# Patient Record
Sex: Male | Born: 1945 | Race: White | Hispanic: No | Marital: Married | Smoking: Never smoker
Health system: Southern US, Community
[De-identification: ages and names within clinical notes are randomized; demographics above are authoritative.]

## PROBLEM LIST (undated history)

## (undated) DIAGNOSIS — J189 Pneumonia, unspecified organism: Secondary | ICD-10-CM

## (undated) DIAGNOSIS — I517 Cardiomegaly: Secondary | ICD-10-CM

## (undated) DIAGNOSIS — H8110 Benign paroxysmal vertigo, unspecified ear: Secondary | ICD-10-CM

## (undated) DIAGNOSIS — Z87442 Personal history of urinary calculi: Secondary | ICD-10-CM

## (undated) DIAGNOSIS — N529 Male erectile dysfunction, unspecified: Secondary | ICD-10-CM

## (undated) DIAGNOSIS — F909 Attention-deficit hyperactivity disorder, unspecified type: Secondary | ICD-10-CM

## (undated) DIAGNOSIS — D649 Anemia, unspecified: Secondary | ICD-10-CM

## (undated) DIAGNOSIS — E785 Hyperlipidemia, unspecified: Secondary | ICD-10-CM

## (undated) DIAGNOSIS — M519 Unspecified thoracic, thoracolumbar and lumbosacral intervertebral disc disorder: Secondary | ICD-10-CM

## (undated) DIAGNOSIS — K219 Gastro-esophageal reflux disease without esophagitis: Secondary | ICD-10-CM

## (undated) DIAGNOSIS — G4700 Insomnia, unspecified: Secondary | ICD-10-CM

## (undated) DIAGNOSIS — F988 Other specified behavioral and emotional disorders with onset usually occurring in childhood and adolescence: Secondary | ICD-10-CM

## (undated) DIAGNOSIS — F329 Major depressive disorder, single episode, unspecified: Secondary | ICD-10-CM

## (undated) DIAGNOSIS — F419 Anxiety disorder, unspecified: Secondary | ICD-10-CM

## (undated) DIAGNOSIS — I2510 Atherosclerotic heart disease of native coronary artery without angina pectoris: Secondary | ICD-10-CM

## (undated) DIAGNOSIS — I209 Angina pectoris, unspecified: Secondary | ICD-10-CM

## (undated) DIAGNOSIS — IMO0001 Reserved for inherently not codable concepts without codable children: Secondary | ICD-10-CM

## (undated) DIAGNOSIS — F411 Generalized anxiety disorder: Secondary | ICD-10-CM

## (undated) DIAGNOSIS — I10 Essential (primary) hypertension: Secondary | ICD-10-CM

## (undated) DIAGNOSIS — M1990 Unspecified osteoarthritis, unspecified site: Secondary | ICD-10-CM

## (undated) DIAGNOSIS — F418 Other specified anxiety disorders: Secondary | ICD-10-CM

## (undated) DIAGNOSIS — F32A Depression, unspecified: Secondary | ICD-10-CM

## (undated) DIAGNOSIS — E291 Testicular hypofunction: Secondary | ICD-10-CM

## (undated) DIAGNOSIS — I252 Old myocardial infarction: Secondary | ICD-10-CM

## (undated) DIAGNOSIS — F901 Attention-deficit hyperactivity disorder, predominantly hyperactive type: Secondary | ICD-10-CM

## (undated) DIAGNOSIS — J449 Chronic obstructive pulmonary disease, unspecified: Secondary | ICD-10-CM

## (undated) DIAGNOSIS — M459 Ankylosing spondylitis of unspecified sites in spine: Secondary | ICD-10-CM

## (undated) HISTORY — DX: Old myocardial infarction: I25.2

## (undated) HISTORY — DX: Insomnia, unspecified: G47.00

## (undated) HISTORY — DX: Other specified anxiety disorders: F41.8

## (undated) HISTORY — DX: Ankylosing spondylitis of unspecified sites in spine: M45.9

## (undated) HISTORY — PX: HERNIA REPAIR: SHX51

## (undated) HISTORY — DX: Benign paroxysmal vertigo, unspecified ear: H81.10

## (undated) HISTORY — PX: CHOLECYSTECTOMY: SHX55

## (undated) HISTORY — DX: Gastro-esophageal reflux disease without esophagitis: K21.9

## (undated) HISTORY — DX: Major depressive disorder, single episode, unspecified: F32.9

## (undated) HISTORY — PX: CORONARY ANGIOPLASTY: SHX604

## (undated) HISTORY — DX: Depression, unspecified: F32.A

## (undated) HISTORY — DX: Unspecified thoracic, thoracolumbar and lumbosacral intervertebral disc disorder: M51.9

## (undated) HISTORY — DX: Attention-deficit hyperactivity disorder, unspecified type: F90.9

## (undated) HISTORY — DX: Unspecified osteoarthritis, unspecified site: M19.90

## (undated) HISTORY — PX: LUMBAR DISC SURGERY: SHX700

## (undated) HISTORY — DX: Hyperlipidemia, unspecified: E78.5

## (undated) HISTORY — DX: Male erectile dysfunction, unspecified: N52.9

## (undated) HISTORY — DX: Generalized anxiety disorder: F41.1

## (undated) HISTORY — PX: VASECTOMY: SHX75

## (undated) HISTORY — PX: OTHER SURGICAL HISTORY: SHX169

## (undated) HISTORY — PX: UPPER GI ENDOSCOPY: SHX6162

## (undated) HISTORY — PX: BACK SURGERY: SHX140

## (undated) HISTORY — PX: TONSILLECTOMY: SUR1361

## (undated) HISTORY — DX: Atherosclerotic heart disease of native coronary artery without angina pectoris: I25.10

## (undated) HISTORY — DX: Testicular hypofunction: E29.1

## (undated) HISTORY — DX: Cardiomegaly: I51.7

## (undated) HISTORY — DX: Other specified behavioral and emotional disorders with onset usually occurring in childhood and adolescence: F98.8

## (undated) HISTORY — PX: FOOT SURGERY: SHX648

## (undated) HISTORY — DX: Attention-deficit hyperactivity disorder, predominantly hyperactive type: F90.1

## (undated) HISTORY — PX: CORONARY ANGIOPLASTY WITH STENT PLACEMENT: SHX49

## (undated) HISTORY — PX: COLONOSCOPY: SHX174

---

## 1993-06-10 DIAGNOSIS — I252 Old myocardial infarction: Secondary | ICD-10-CM

## 1993-06-10 HISTORY — DX: Old myocardial infarction: I25.2

## 1998-03-12 ENCOUNTER — Inpatient Hospital Stay (HOSPITAL_COMMUNITY): Admission: EM | Admit: 1998-03-12 | Discharge: 1998-03-13 | Payer: Self-pay | Admitting: Emergency Medicine

## 1998-05-11 ENCOUNTER — Ambulatory Visit (HOSPITAL_COMMUNITY): Admission: RE | Admit: 1998-05-11 | Discharge: 1998-05-12 | Payer: Self-pay | Admitting: Cardiology

## 1999-10-26 ENCOUNTER — Emergency Department (HOSPITAL_COMMUNITY): Admission: EM | Admit: 1999-10-26 | Discharge: 1999-10-27 | Payer: Self-pay | Admitting: Emergency Medicine

## 1999-12-05 ENCOUNTER — Encounter: Admission: RE | Admit: 1999-12-05 | Discharge: 1999-12-05 | Payer: Self-pay | Admitting: Internal Medicine

## 1999-12-05 ENCOUNTER — Encounter: Payer: Self-pay | Admitting: Internal Medicine

## 1999-12-25 ENCOUNTER — Encounter: Admission: RE | Admit: 1999-12-25 | Discharge: 2000-01-17 | Payer: Self-pay | Admitting: Neurosurgery

## 2000-01-09 ENCOUNTER — Encounter (INDEPENDENT_AMBULATORY_CARE_PROVIDER_SITE_OTHER): Payer: Self-pay | Admitting: Specialist

## 2000-01-09 ENCOUNTER — Ambulatory Visit (HOSPITAL_COMMUNITY): Admission: RE | Admit: 2000-01-09 | Discharge: 2000-01-09 | Payer: Self-pay | Admitting: Gastroenterology

## 2000-03-07 ENCOUNTER — Other Ambulatory Visit: Admission: RE | Admit: 2000-03-07 | Discharge: 2000-03-07 | Payer: Self-pay | Admitting: Urology

## 2000-03-07 ENCOUNTER — Encounter (INDEPENDENT_AMBULATORY_CARE_PROVIDER_SITE_OTHER): Payer: Self-pay | Admitting: *Deleted

## 2001-02-01 ENCOUNTER — Encounter: Payer: Self-pay | Admitting: Emergency Medicine

## 2001-02-01 ENCOUNTER — Emergency Department (HOSPITAL_COMMUNITY): Admission: EM | Admit: 2001-02-01 | Discharge: 2001-02-02 | Payer: Self-pay | Admitting: Emergency Medicine

## 2001-04-10 ENCOUNTER — Encounter: Admission: RE | Admit: 2001-04-10 | Discharge: 2001-04-10 | Payer: Self-pay | Admitting: Gastroenterology

## 2001-04-10 ENCOUNTER — Encounter: Payer: Self-pay | Admitting: Gastroenterology

## 2004-01-19 ENCOUNTER — Encounter: Admission: RE | Admit: 2004-01-19 | Discharge: 2004-01-19 | Payer: Self-pay | Admitting: Family Medicine

## 2004-04-15 ENCOUNTER — Ambulatory Visit (HOSPITAL_BASED_OUTPATIENT_CLINIC_OR_DEPARTMENT_OTHER): Admission: RE | Admit: 2004-04-15 | Discharge: 2004-04-15 | Payer: Self-pay | Admitting: Internal Medicine

## 2004-05-07 ENCOUNTER — Ambulatory Visit (HOSPITAL_COMMUNITY): Admission: RE | Admit: 2004-05-07 | Discharge: 2004-05-07 | Payer: Self-pay | Admitting: Internal Medicine

## 2004-05-22 ENCOUNTER — Ambulatory Visit (HOSPITAL_COMMUNITY): Admission: RE | Admit: 2004-05-22 | Discharge: 2004-05-22 | Payer: Self-pay | Admitting: Internal Medicine

## 2004-05-23 ENCOUNTER — Ambulatory Visit (HOSPITAL_COMMUNITY): Admission: RE | Admit: 2004-05-23 | Discharge: 2004-05-23 | Payer: Self-pay | Admitting: Internal Medicine

## 2004-06-10 HISTORY — PX: CARDIAC CATHETERIZATION: SHX172

## 2004-07-03 ENCOUNTER — Ambulatory Visit: Payer: Self-pay | Admitting: Internal Medicine

## 2004-10-01 ENCOUNTER — Ambulatory Visit: Payer: Self-pay | Admitting: Internal Medicine

## 2005-02-07 ENCOUNTER — Ambulatory Visit (HOSPITAL_COMMUNITY): Admission: RE | Admit: 2005-02-07 | Discharge: 2005-02-07 | Payer: Self-pay | Admitting: Cardiology

## 2005-03-06 ENCOUNTER — Ambulatory Visit: Payer: Self-pay | Admitting: Pulmonary Disease

## 2005-04-01 ENCOUNTER — Ambulatory Visit: Payer: Self-pay | Admitting: Internal Medicine

## 2005-07-29 ENCOUNTER — Ambulatory Visit: Payer: Self-pay | Admitting: Internal Medicine

## 2005-09-02 ENCOUNTER — Encounter: Admission: RE | Admit: 2005-09-02 | Discharge: 2005-09-02 | Payer: Self-pay

## 2005-11-14 ENCOUNTER — Ambulatory Visit: Payer: Self-pay | Admitting: Family Medicine

## 2006-01-27 ENCOUNTER — Ambulatory Visit: Payer: Self-pay | Admitting: Internal Medicine

## 2006-02-14 ENCOUNTER — Ambulatory Visit: Payer: Self-pay | Admitting: Family Medicine

## 2006-02-19 ENCOUNTER — Ambulatory Visit: Payer: Self-pay | Admitting: Family Medicine

## 2006-02-24 ENCOUNTER — Ambulatory Visit: Payer: Self-pay | Admitting: Family Medicine

## 2006-02-28 ENCOUNTER — Ambulatory Visit: Payer: Self-pay | Admitting: Family Medicine

## 2006-03-31 LAB — HM COLONOSCOPY

## 2006-04-04 ENCOUNTER — Ambulatory Visit: Payer: Self-pay | Admitting: Family Medicine

## 2006-07-30 ENCOUNTER — Ambulatory Visit: Payer: Self-pay | Admitting: Internal Medicine

## 2006-09-24 ENCOUNTER — Ambulatory Visit (HOSPITAL_COMMUNITY): Admission: RE | Admit: 2006-09-24 | Discharge: 2006-09-25 | Payer: Self-pay | Admitting: Specialist

## 2006-09-24 ENCOUNTER — Encounter (INDEPENDENT_AMBULATORY_CARE_PROVIDER_SITE_OTHER): Payer: Self-pay | Admitting: *Deleted

## 2006-10-20 ENCOUNTER — Ambulatory Visit: Payer: Self-pay | Admitting: Family Medicine

## 2006-12-19 ENCOUNTER — Ambulatory Visit: Payer: Self-pay | Admitting: Family Medicine

## 2007-01-01 ENCOUNTER — Ambulatory Visit: Payer: Self-pay | Admitting: Family Medicine

## 2007-01-06 ENCOUNTER — Ambulatory Visit: Payer: Self-pay | Admitting: Vascular Surgery

## 2007-02-10 ENCOUNTER — Ambulatory Visit: Payer: Self-pay | Admitting: Internal Medicine

## 2007-03-23 ENCOUNTER — Ambulatory Visit: Payer: Self-pay | Admitting: Family Medicine

## 2007-04-16 ENCOUNTER — Ambulatory Visit (HOSPITAL_COMMUNITY): Admission: RE | Admit: 2007-04-16 | Discharge: 2007-04-17 | Payer: Self-pay | Admitting: Specialist

## 2007-04-23 ENCOUNTER — Ambulatory Visit: Payer: Self-pay | Admitting: Family Medicine

## 2007-04-30 ENCOUNTER — Ambulatory Visit: Payer: Self-pay | Admitting: Family Medicine

## 2007-05-01 ENCOUNTER — Telehealth: Payer: Self-pay | Admitting: Internal Medicine

## 2007-05-11 ENCOUNTER — Ambulatory Visit: Payer: Self-pay | Admitting: Internal Medicine

## 2007-06-09 ENCOUNTER — Encounter: Payer: Self-pay | Admitting: Internal Medicine

## 2007-06-09 DIAGNOSIS — F901 Attention-deficit hyperactivity disorder, predominantly hyperactive type: Secondary | ICD-10-CM | POA: Insufficient documentation

## 2007-06-09 DIAGNOSIS — J309 Allergic rhinitis, unspecified: Secondary | ICD-10-CM | POA: Insufficient documentation

## 2007-06-09 DIAGNOSIS — J209 Acute bronchitis, unspecified: Secondary | ICD-10-CM | POA: Insufficient documentation

## 2007-06-09 DIAGNOSIS — I252 Old myocardial infarction: Secondary | ICD-10-CM | POA: Insufficient documentation

## 2007-06-09 DIAGNOSIS — F909 Attention-deficit hyperactivity disorder, unspecified type: Secondary | ICD-10-CM | POA: Insufficient documentation

## 2007-06-15 ENCOUNTER — Ambulatory Visit: Payer: Self-pay | Admitting: Family Medicine

## 2007-06-18 ENCOUNTER — Encounter: Admission: RE | Admit: 2007-06-18 | Discharge: 2007-06-18 | Payer: Self-pay | Admitting: Family Medicine

## 2007-06-22 ENCOUNTER — Ambulatory Visit: Payer: Self-pay | Admitting: Family Medicine

## 2007-08-13 ENCOUNTER — Ambulatory Visit: Payer: Self-pay | Admitting: Family Medicine

## 2007-09-09 ENCOUNTER — Ambulatory Visit: Payer: Self-pay | Admitting: Internal Medicine

## 2007-09-12 DIAGNOSIS — J45998 Other asthma: Secondary | ICD-10-CM | POA: Insufficient documentation

## 2007-09-12 DIAGNOSIS — L299 Pruritus, unspecified: Secondary | ICD-10-CM | POA: Insufficient documentation

## 2007-10-13 ENCOUNTER — Ambulatory Visit: Payer: Self-pay | Admitting: Family Medicine

## 2007-10-16 ENCOUNTER — Telehealth: Payer: Self-pay | Admitting: Internal Medicine

## 2007-10-19 ENCOUNTER — Ambulatory Visit: Payer: Self-pay | Admitting: Internal Medicine

## 2007-10-19 LAB — CONVERTED CEMR LAB
AST: 33 units/L (ref 0–37)
Alkaline Phosphatase: 66 units/L (ref 39–117)
BUN: 17 mg/dL (ref 6–23)
Basophils Relative: 0.7 % (ref 0.0–1.0)
Bilirubin, Direct: 0.1 mg/dL (ref 0.0–0.3)
HCT: 42.7 % (ref 39.0–52.0)
Lymphocytes Relative: 29 % (ref 12.0–46.0)
MCHC: 33.6 g/dL (ref 30.0–36.0)
MCV: 89.2 fL (ref 78.0–100.0)
Neutrophils Relative %: 59.4 % (ref 43.0–77.0)
Potassium: 4.1 meq/L (ref 3.5–5.1)
RBC: 4.79 M/uL (ref 4.22–5.81)
Sodium: 142 meq/L (ref 135–145)
Total Protein: 7.3 g/dL (ref 6.0–8.3)

## 2008-01-07 ENCOUNTER — Ambulatory Visit: Payer: Self-pay | Admitting: Internal Medicine

## 2008-01-22 ENCOUNTER — Ambulatory Visit: Payer: Self-pay | Admitting: Internal Medicine

## 2008-02-01 ENCOUNTER — Ambulatory Visit: Payer: Self-pay | Admitting: Internal Medicine

## 2008-02-08 ENCOUNTER — Telehealth (INDEPENDENT_AMBULATORY_CARE_PROVIDER_SITE_OTHER): Payer: Self-pay | Admitting: *Deleted

## 2008-02-09 ENCOUNTER — Telehealth (INDEPENDENT_AMBULATORY_CARE_PROVIDER_SITE_OTHER): Payer: Self-pay | Admitting: *Deleted

## 2008-02-12 ENCOUNTER — Telehealth (INDEPENDENT_AMBULATORY_CARE_PROVIDER_SITE_OTHER): Payer: Self-pay | Admitting: *Deleted

## 2008-02-16 ENCOUNTER — Ambulatory Visit: Payer: Self-pay | Admitting: Internal Medicine

## 2008-02-19 ENCOUNTER — Telehealth (INDEPENDENT_AMBULATORY_CARE_PROVIDER_SITE_OTHER): Payer: Self-pay | Admitting: *Deleted

## 2008-02-23 ENCOUNTER — Ambulatory Visit: Payer: Self-pay | Admitting: Internal Medicine

## 2008-02-25 ENCOUNTER — Encounter: Payer: Self-pay | Admitting: Internal Medicine

## 2008-03-01 ENCOUNTER — Ambulatory Visit: Payer: Self-pay | Admitting: Internal Medicine

## 2008-03-02 ENCOUNTER — Ambulatory Visit: Payer: Self-pay | Admitting: Cardiology

## 2008-03-02 LAB — CONVERTED CEMR LAB
BUN: 23 mg/dL (ref 6–23)
CO2: 30 meq/L (ref 19–32)
Creatinine, Ser: 1.1 mg/dL (ref 0.4–1.5)
GFR calc Af Amer: 87 mL/min
GFR calc non Af Amer: 72 mL/min
Glucose, Bld: 92 mg/dL (ref 70–99)
Potassium: 4.1 meq/L (ref 3.5–5.1)
Pro B Natriuretic peptide (BNP): 12 pg/mL (ref 0.0–100.0)

## 2008-03-03 ENCOUNTER — Ambulatory Visit: Payer: Self-pay | Admitting: Internal Medicine

## 2008-03-17 ENCOUNTER — Ambulatory Visit: Payer: Self-pay | Admitting: Family Medicine

## 2008-03-18 ENCOUNTER — Telehealth (INDEPENDENT_AMBULATORY_CARE_PROVIDER_SITE_OTHER): Payer: Self-pay | Admitting: *Deleted

## 2008-03-23 ENCOUNTER — Encounter: Payer: Self-pay | Admitting: Internal Medicine

## 2008-03-24 ENCOUNTER — Ambulatory Visit: Payer: Self-pay | Admitting: Internal Medicine

## 2008-03-25 ENCOUNTER — Ambulatory Visit: Payer: Self-pay | Admitting: Internal Medicine

## 2008-03-28 ENCOUNTER — Ambulatory Visit: Payer: Self-pay | Admitting: Internal Medicine

## 2008-03-31 ENCOUNTER — Ambulatory Visit: Payer: Self-pay | Admitting: Internal Medicine

## 2008-04-04 ENCOUNTER — Ambulatory Visit: Payer: Self-pay | Admitting: Internal Medicine

## 2008-04-07 ENCOUNTER — Ambulatory Visit: Payer: Self-pay | Admitting: Internal Medicine

## 2008-04-12 ENCOUNTER — Ambulatory Visit: Payer: Self-pay | Admitting: Internal Medicine

## 2008-04-13 ENCOUNTER — Encounter: Payer: Self-pay | Admitting: Internal Medicine

## 2008-04-15 ENCOUNTER — Ambulatory Visit: Payer: Self-pay | Admitting: Internal Medicine

## 2008-04-18 ENCOUNTER — Ambulatory Visit: Payer: Self-pay | Admitting: Internal Medicine

## 2008-04-20 ENCOUNTER — Ambulatory Visit: Payer: Self-pay | Admitting: Internal Medicine

## 2008-04-21 ENCOUNTER — Ambulatory Visit: Payer: Self-pay | Admitting: Internal Medicine

## 2008-04-25 ENCOUNTER — Ambulatory Visit: Payer: Self-pay | Admitting: Internal Medicine

## 2008-04-29 ENCOUNTER — Ambulatory Visit: Payer: Self-pay | Admitting: Internal Medicine

## 2008-05-02 ENCOUNTER — Ambulatory Visit: Payer: Self-pay | Admitting: Internal Medicine

## 2008-05-09 ENCOUNTER — Ambulatory Visit: Payer: Self-pay | Admitting: Internal Medicine

## 2008-05-12 ENCOUNTER — Ambulatory Visit: Payer: Self-pay | Admitting: Internal Medicine

## 2008-05-16 ENCOUNTER — Ambulatory Visit: Payer: Self-pay | Admitting: Internal Medicine

## 2008-05-20 ENCOUNTER — Ambulatory Visit: Payer: Self-pay | Admitting: Internal Medicine

## 2008-05-23 ENCOUNTER — Ambulatory Visit: Payer: Self-pay | Admitting: Internal Medicine

## 2008-05-24 ENCOUNTER — Ambulatory Visit: Payer: Self-pay | Admitting: Internal Medicine

## 2008-06-13 ENCOUNTER — Ambulatory Visit: Payer: Self-pay | Admitting: Internal Medicine

## 2008-06-17 ENCOUNTER — Ambulatory Visit: Payer: Self-pay | Admitting: Internal Medicine

## 2008-06-20 ENCOUNTER — Ambulatory Visit: Payer: Self-pay | Admitting: Internal Medicine

## 2008-06-24 ENCOUNTER — Ambulatory Visit: Payer: Self-pay | Admitting: Internal Medicine

## 2008-07-01 ENCOUNTER — Ambulatory Visit: Payer: Self-pay | Admitting: Internal Medicine

## 2008-07-04 ENCOUNTER — Telehealth (INDEPENDENT_AMBULATORY_CARE_PROVIDER_SITE_OTHER): Payer: Self-pay | Admitting: *Deleted

## 2008-07-05 ENCOUNTER — Ambulatory Visit: Payer: Self-pay | Admitting: Internal Medicine

## 2008-07-08 ENCOUNTER — Ambulatory Visit: Payer: Self-pay | Admitting: Internal Medicine

## 2008-07-11 ENCOUNTER — Ambulatory Visit: Payer: Self-pay | Admitting: Internal Medicine

## 2008-07-11 ENCOUNTER — Ambulatory Visit: Payer: Self-pay | Admitting: Family Medicine

## 2008-07-14 ENCOUNTER — Ambulatory Visit: Payer: Self-pay | Admitting: Internal Medicine

## 2008-07-18 ENCOUNTER — Ambulatory Visit: Payer: Self-pay | Admitting: Internal Medicine

## 2008-07-21 ENCOUNTER — Ambulatory Visit: Payer: Self-pay | Admitting: Internal Medicine

## 2008-08-04 ENCOUNTER — Ambulatory Visit: Payer: Self-pay | Admitting: Internal Medicine

## 2008-08-10 ENCOUNTER — Ambulatory Visit: Payer: Self-pay | Admitting: Internal Medicine

## 2008-08-25 ENCOUNTER — Ambulatory Visit: Payer: Self-pay | Admitting: Internal Medicine

## 2008-09-02 ENCOUNTER — Ambulatory Visit: Payer: Self-pay | Admitting: Internal Medicine

## 2008-09-12 ENCOUNTER — Ambulatory Visit: Payer: Self-pay | Admitting: Internal Medicine

## 2008-09-21 ENCOUNTER — Ambulatory Visit: Payer: Self-pay | Admitting: Internal Medicine

## 2008-09-28 ENCOUNTER — Ambulatory Visit: Payer: Self-pay | Admitting: Internal Medicine

## 2008-09-30 ENCOUNTER — Telehealth (INDEPENDENT_AMBULATORY_CARE_PROVIDER_SITE_OTHER): Payer: Self-pay | Admitting: *Deleted

## 2008-10-05 ENCOUNTER — Ambulatory Visit: Payer: Self-pay | Admitting: Internal Medicine

## 2008-10-12 ENCOUNTER — Ambulatory Visit: Payer: Self-pay | Admitting: Internal Medicine

## 2008-10-21 ENCOUNTER — Ambulatory Visit: Payer: Self-pay | Admitting: Internal Medicine

## 2008-10-23 ENCOUNTER — Encounter: Payer: Self-pay | Admitting: Internal Medicine

## 2008-10-26 ENCOUNTER — Ambulatory Visit: Payer: Self-pay | Admitting: Internal Medicine

## 2008-11-01 ENCOUNTER — Ambulatory Visit: Payer: Self-pay | Admitting: Family Medicine

## 2008-11-02 ENCOUNTER — Ambulatory Visit: Payer: Self-pay | Admitting: Internal Medicine

## 2008-11-09 ENCOUNTER — Ambulatory Visit: Payer: Self-pay | Admitting: Internal Medicine

## 2008-11-17 ENCOUNTER — Ambulatory Visit: Payer: Self-pay | Admitting: Internal Medicine

## 2008-11-23 ENCOUNTER — Ambulatory Visit: Payer: Self-pay | Admitting: Internal Medicine

## 2008-12-05 ENCOUNTER — Ambulatory Visit: Payer: Self-pay | Admitting: Internal Medicine

## 2008-12-08 ENCOUNTER — Ambulatory Visit: Payer: Self-pay | Admitting: Internal Medicine

## 2008-12-15 ENCOUNTER — Ambulatory Visit: Payer: Self-pay | Admitting: Internal Medicine

## 2008-12-15 ENCOUNTER — Emergency Department (HOSPITAL_COMMUNITY): Admission: EM | Admit: 2008-12-15 | Discharge: 2008-12-16 | Payer: Self-pay | Admitting: Emergency Medicine

## 2008-12-23 ENCOUNTER — Ambulatory Visit: Payer: Self-pay | Admitting: Family Medicine

## 2009-01-03 ENCOUNTER — Ambulatory Visit: Payer: Self-pay | Admitting: Internal Medicine

## 2009-01-03 ENCOUNTER — Ambulatory Visit: Payer: Self-pay | Admitting: Family Medicine

## 2009-01-04 ENCOUNTER — Telehealth: Payer: Self-pay | Admitting: Internal Medicine

## 2009-01-10 ENCOUNTER — Ambulatory Visit: Payer: Self-pay | Admitting: Internal Medicine

## 2009-01-25 ENCOUNTER — Ambulatory Visit: Payer: Self-pay | Admitting: Internal Medicine

## 2009-02-02 ENCOUNTER — Ambulatory Visit: Payer: Self-pay | Admitting: Internal Medicine

## 2009-02-03 ENCOUNTER — Telehealth: Payer: Self-pay | Admitting: Internal Medicine

## 2009-02-08 ENCOUNTER — Ambulatory Visit: Payer: Self-pay | Admitting: Internal Medicine

## 2009-02-17 ENCOUNTER — Ambulatory Visit: Payer: Self-pay | Admitting: Internal Medicine

## 2009-02-23 ENCOUNTER — Ambulatory Visit: Payer: Self-pay | Admitting: Internal Medicine

## 2009-03-02 ENCOUNTER — Ambulatory Visit: Payer: Self-pay | Admitting: Internal Medicine

## 2009-03-02 ENCOUNTER — Ambulatory Visit: Payer: Self-pay | Admitting: Family Medicine

## 2009-03-08 ENCOUNTER — Ambulatory Visit: Payer: Self-pay | Admitting: Internal Medicine

## 2009-03-17 ENCOUNTER — Ambulatory Visit: Payer: Self-pay | Admitting: Internal Medicine

## 2009-03-17 ENCOUNTER — Ambulatory Visit: Payer: Self-pay | Admitting: Family Medicine

## 2009-03-20 ENCOUNTER — Ambulatory Visit: Payer: Self-pay | Admitting: Internal Medicine

## 2009-03-23 ENCOUNTER — Ambulatory Visit: Payer: Self-pay | Admitting: Internal Medicine

## 2009-03-29 ENCOUNTER — Ambulatory Visit: Payer: Self-pay | Admitting: Internal Medicine

## 2009-04-11 ENCOUNTER — Encounter: Payer: Self-pay | Admitting: Internal Medicine

## 2009-04-14 ENCOUNTER — Ambulatory Visit: Payer: Self-pay | Admitting: Internal Medicine

## 2009-04-19 ENCOUNTER — Ambulatory Visit: Payer: Self-pay | Admitting: Internal Medicine

## 2009-04-20 ENCOUNTER — Encounter: Payer: Self-pay | Admitting: Adult Health

## 2009-04-20 ENCOUNTER — Ambulatory Visit: Payer: Self-pay | Admitting: Internal Medicine

## 2009-04-21 LAB — CONVERTED CEMR LAB: IgE (Immunoglobulin E), Serum: 13.8 intl units/mL (ref 0.0–180.0)

## 2009-04-26 ENCOUNTER — Ambulatory Visit: Payer: Self-pay | Admitting: Internal Medicine

## 2009-05-02 ENCOUNTER — Ambulatory Visit: Payer: Self-pay | Admitting: Internal Medicine

## 2009-05-06 ENCOUNTER — Emergency Department (HOSPITAL_COMMUNITY): Admission: EM | Admit: 2009-05-06 | Discharge: 2009-05-07 | Payer: Self-pay | Admitting: Internal Medicine

## 2009-05-11 ENCOUNTER — Ambulatory Visit: Payer: Self-pay | Admitting: Internal Medicine

## 2009-05-18 ENCOUNTER — Ambulatory Visit: Payer: Self-pay | Admitting: Internal Medicine

## 2009-05-23 ENCOUNTER — Ambulatory Visit: Payer: Self-pay | Admitting: Internal Medicine

## 2009-05-31 ENCOUNTER — Encounter: Payer: Self-pay | Admitting: Internal Medicine

## 2009-05-31 ENCOUNTER — Ambulatory Visit (HOSPITAL_COMMUNITY): Admission: RE | Admit: 2009-05-31 | Discharge: 2009-05-31 | Payer: Self-pay | Admitting: Internal Medicine

## 2009-06-01 ENCOUNTER — Ambulatory Visit: Payer: Self-pay | Admitting: Internal Medicine

## 2009-06-05 ENCOUNTER — Ambulatory Visit: Payer: Self-pay | Admitting: Internal Medicine

## 2009-06-15 ENCOUNTER — Ambulatory Visit: Payer: Self-pay | Admitting: Internal Medicine

## 2009-06-23 ENCOUNTER — Ambulatory Visit: Payer: Self-pay | Admitting: Internal Medicine

## 2009-06-29 ENCOUNTER — Ambulatory Visit: Payer: Self-pay | Admitting: Internal Medicine

## 2009-07-05 ENCOUNTER — Ambulatory Visit: Payer: Self-pay | Admitting: Internal Medicine

## 2009-07-11 ENCOUNTER — Encounter (HOSPITAL_COMMUNITY): Admission: RE | Admit: 2009-07-11 | Discharge: 2009-10-09 | Payer: Self-pay | Admitting: Internal Medicine

## 2009-07-12 ENCOUNTER — Ambulatory Visit: Payer: Self-pay | Admitting: Internal Medicine

## 2009-07-18 ENCOUNTER — Telehealth: Payer: Self-pay | Admitting: Internal Medicine

## 2009-07-18 ENCOUNTER — Ambulatory Visit: Payer: Self-pay | Admitting: Internal Medicine

## 2009-07-25 ENCOUNTER — Telehealth: Payer: Self-pay | Admitting: Internal Medicine

## 2009-07-25 ENCOUNTER — Ambulatory Visit: Payer: Self-pay | Admitting: Internal Medicine

## 2009-07-26 ENCOUNTER — Telehealth (INDEPENDENT_AMBULATORY_CARE_PROVIDER_SITE_OTHER): Payer: Self-pay | Admitting: *Deleted

## 2009-08-02 ENCOUNTER — Ambulatory Visit: Payer: Self-pay | Admitting: Internal Medicine

## 2009-08-08 ENCOUNTER — Ambulatory Visit: Payer: Self-pay | Admitting: Internal Medicine

## 2009-08-16 ENCOUNTER — Ambulatory Visit: Payer: Self-pay | Admitting: Internal Medicine

## 2009-08-17 ENCOUNTER — Ambulatory Visit: Payer: Self-pay | Admitting: Family Medicine

## 2009-08-22 ENCOUNTER — Ambulatory Visit: Payer: Self-pay | Admitting: Internal Medicine

## 2009-09-05 ENCOUNTER — Encounter: Payer: Self-pay | Admitting: Family Medicine

## 2009-09-07 ENCOUNTER — Ambulatory Visit: Payer: Self-pay | Admitting: Internal Medicine

## 2009-09-08 ENCOUNTER — Ambulatory Visit: Payer: Self-pay | Admitting: Internal Medicine

## 2009-09-08 ENCOUNTER — Ambulatory Visit: Payer: Self-pay | Admitting: Family Medicine

## 2009-09-13 ENCOUNTER — Ambulatory Visit: Payer: Self-pay | Admitting: Internal Medicine

## 2009-09-15 ENCOUNTER — Ambulatory Visit: Payer: Self-pay | Admitting: Family Medicine

## 2009-09-18 ENCOUNTER — Ambulatory Visit: Payer: Self-pay | Admitting: Internal Medicine

## 2009-09-19 ENCOUNTER — Ambulatory Visit: Payer: Self-pay | Admitting: Internal Medicine

## 2009-09-26 ENCOUNTER — Encounter: Admission: RE | Admit: 2009-09-26 | Discharge: 2009-12-25 | Payer: Self-pay | Admitting: Family Medicine

## 2009-09-27 ENCOUNTER — Encounter: Payer: Self-pay | Admitting: Internal Medicine

## 2009-09-28 ENCOUNTER — Ambulatory Visit: Payer: Self-pay | Admitting: Internal Medicine

## 2009-10-04 ENCOUNTER — Ambulatory Visit: Payer: Self-pay | Admitting: Internal Medicine

## 2009-10-10 ENCOUNTER — Encounter (HOSPITAL_COMMUNITY): Admission: RE | Admit: 2009-10-10 | Discharge: 2009-10-10 | Payer: Self-pay | Admitting: Internal Medicine

## 2009-10-10 ENCOUNTER — Ambulatory Visit: Payer: Self-pay | Admitting: Internal Medicine

## 2009-10-18 ENCOUNTER — Ambulatory Visit: Payer: Self-pay | Admitting: Internal Medicine

## 2009-10-27 ENCOUNTER — Ambulatory Visit: Payer: Self-pay | Admitting: Internal Medicine

## 2009-11-01 ENCOUNTER — Ambulatory Visit: Payer: Self-pay | Admitting: Internal Medicine

## 2009-11-09 ENCOUNTER — Ambulatory Visit: Payer: Self-pay | Admitting: Internal Medicine

## 2009-11-13 ENCOUNTER — Encounter: Payer: Self-pay | Admitting: Internal Medicine

## 2009-11-14 ENCOUNTER — Ambulatory Visit: Payer: Self-pay | Admitting: Internal Medicine

## 2009-11-22 ENCOUNTER — Ambulatory Visit: Payer: Self-pay | Admitting: Internal Medicine

## 2009-12-05 ENCOUNTER — Ambulatory Visit: Payer: Self-pay | Admitting: Internal Medicine

## 2009-12-19 ENCOUNTER — Ambulatory Visit: Payer: Self-pay | Admitting: Internal Medicine

## 2009-12-27 ENCOUNTER — Ambulatory Visit: Payer: Self-pay | Admitting: Internal Medicine

## 2010-01-05 ENCOUNTER — Ambulatory Visit: Payer: Self-pay | Admitting: Internal Medicine

## 2010-01-08 ENCOUNTER — Ambulatory Visit: Payer: Self-pay | Admitting: Internal Medicine

## 2010-01-08 ENCOUNTER — Encounter: Payer: Self-pay | Admitting: Internal Medicine

## 2010-01-09 ENCOUNTER — Ambulatory Visit: Payer: Self-pay | Admitting: Internal Medicine

## 2010-01-15 ENCOUNTER — Telehealth (INDEPENDENT_AMBULATORY_CARE_PROVIDER_SITE_OTHER): Payer: Self-pay | Admitting: *Deleted

## 2010-01-25 ENCOUNTER — Ambulatory Visit: Payer: Self-pay | Admitting: Internal Medicine

## 2010-02-05 ENCOUNTER — Ambulatory Visit: Payer: Self-pay | Admitting: Internal Medicine

## 2010-02-13 ENCOUNTER — Ambulatory Visit: Payer: Self-pay | Admitting: Internal Medicine

## 2010-02-20 ENCOUNTER — Ambulatory Visit: Payer: Self-pay | Admitting: Internal Medicine

## 2010-02-26 ENCOUNTER — Ambulatory Visit: Payer: Self-pay | Admitting: Internal Medicine

## 2010-03-03 ENCOUNTER — Emergency Department (HOSPITAL_COMMUNITY): Admission: EM | Admit: 2010-03-03 | Discharge: 2010-03-04 | Payer: Self-pay | Admitting: Emergency Medicine

## 2010-03-07 ENCOUNTER — Ambulatory Visit: Payer: Self-pay | Admitting: Internal Medicine

## 2010-03-13 ENCOUNTER — Ambulatory Visit: Payer: Self-pay | Admitting: Internal Medicine

## 2010-03-15 ENCOUNTER — Ambulatory Visit: Payer: Self-pay | Admitting: Family Medicine

## 2010-03-19 ENCOUNTER — Encounter: Payer: Self-pay | Admitting: Internal Medicine

## 2010-03-19 ENCOUNTER — Ambulatory Visit: Payer: Self-pay | Admitting: Internal Medicine

## 2010-03-19 DIAGNOSIS — H8110 Benign paroxysmal vertigo, unspecified ear: Secondary | ICD-10-CM | POA: Insufficient documentation

## 2010-03-22 ENCOUNTER — Ambulatory Visit: Payer: Self-pay | Admitting: Family Medicine

## 2010-03-27 ENCOUNTER — Ambulatory Visit: Payer: Self-pay | Admitting: Internal Medicine

## 2010-04-04 ENCOUNTER — Ambulatory Visit: Payer: Self-pay | Admitting: Internal Medicine

## 2010-04-11 ENCOUNTER — Ambulatory Visit: Payer: Self-pay | Admitting: Internal Medicine

## 2010-04-18 ENCOUNTER — Ambulatory Visit: Payer: Self-pay | Admitting: Internal Medicine

## 2010-04-19 ENCOUNTER — Telehealth (INDEPENDENT_AMBULATORY_CARE_PROVIDER_SITE_OTHER): Payer: Self-pay | Admitting: *Deleted

## 2010-04-25 ENCOUNTER — Ambulatory Visit: Payer: Self-pay | Admitting: Internal Medicine

## 2010-05-01 ENCOUNTER — Ambulatory Visit: Payer: Self-pay | Admitting: Internal Medicine

## 2010-05-15 ENCOUNTER — Ambulatory Visit: Payer: Self-pay | Admitting: Internal Medicine

## 2010-05-23 ENCOUNTER — Ambulatory Visit: Payer: Self-pay | Admitting: Internal Medicine

## 2010-05-30 ENCOUNTER — Ambulatory Visit: Payer: Self-pay | Admitting: Internal Medicine

## 2010-06-07 ENCOUNTER — Ambulatory Visit: Admit: 2010-06-07 | Payer: Self-pay | Admitting: Family Medicine

## 2010-06-08 ENCOUNTER — Ambulatory Visit: Payer: Self-pay | Admitting: Internal Medicine

## 2010-06-13 ENCOUNTER — Ambulatory Visit
Admission: RE | Admit: 2010-06-13 | Discharge: 2010-06-13 | Payer: Self-pay | Source: Home / Self Care | Attending: Family Medicine | Admitting: Family Medicine

## 2010-06-14 ENCOUNTER — Ambulatory Visit: Payer: Self-pay | Admitting: Internal Medicine

## 2010-06-21 ENCOUNTER — Ambulatory Visit
Admission: RE | Admit: 2010-06-21 | Discharge: 2010-06-21 | Payer: Self-pay | Source: Home / Self Care | Attending: Internal Medicine | Admitting: Internal Medicine

## 2010-06-25 ENCOUNTER — Ambulatory Visit: Payer: Self-pay | Admitting: Internal Medicine

## 2010-07-03 ENCOUNTER — Ambulatory Visit: Payer: Self-pay | Admitting: Internal Medicine

## 2010-07-05 ENCOUNTER — Ambulatory Visit: Payer: Self-pay | Admitting: Internal Medicine

## 2010-07-09 ENCOUNTER — Ambulatory Visit: Admit: 2010-07-09 | Payer: Self-pay | Admitting: Internal Medicine

## 2010-07-10 NOTE — Assessment & Plan Note (Signed)
Summary: SICK/KLW   Primary Provider/Referring Provider:  Susann Givens  CC:  Accute visit-Vertigo; Head in pressure x 1 week."can't tell a difference with allergy shots"..  History of Present Illness: September 18, 2009- Allergic rhinitis, Asthma, Cough, GERD, Vertigo Having new problem with vertigo x 4 weeks recently- has seen Dr Susann Givens. Denies significant nasal congestion. This has not  been related to his asthma control. Often flares associated with occipital headache. He is also being evaluated for hearing loss by a hearing aid distributor. Pulmonary rehab has helped him manage his asthma better and he says he has not had a significant episode lately. Discussed pollen induced congestion/ eustachian dysfunction as a possible cause. ow using meclizine. No ear pain.  January 08, 2010- Allergic rhinitis, Asthma, Cough, GERD, Vertigo Outdoors a lot this weekend. Began feeling more short of breath. Harsh cough began yesterday. Throat sore from coughing. Denies sick contact. No fever but feels somewhat aching. He has been  back on allergy shots since 9/09. I explained that pollen levels are low and most of what is bothering people is air quality now not allergy. He is continuing nexium and zegerid plus sucralfate for reflux. Benzonatate helps. He still gets hoarse in choir.   March 19, 2010 Allergic rhinitis, Asthma, Cough, GERD, Vertigo Acute visit-Vertigo; Head in pressure x 1 week."can't tell a difference with allergy shots". After good summer, began having more dizziness/ vertigo a week ago. Sometimes feels pressure soreness mid forhead. Thinks he is worse with prolonged time outdoors. Sometimes head feels heavy, worse with movement and gets nauseated. Meclizine helps. Took an otc antihistamine.  Failed inner-ear desensitization positioning.    Asthma History    Asthma Control Assessment:    Age range: 12+ years    Symptoms: 0-2 days/week    Nighttime Awakenings: 0-2/month    Interferes w/  normal activity: no limitations    SABA use (not for EIB): 0-2 days/week    Asthma Control Assessment: Well Controlled   Preventive Screening-Counseling & Management  Alcohol-Tobacco     Smoking Status: never  Current Medications (verified): 1)  Wellbutrin Xl 300 Mg  Tb24 (Bupropion Hcl) .[name].. 1 Once Daily 2)  Paroxetine Hcl 20 Mg  Tabs (Paroxetine Hcl) .... 1/2 Once Daily 3)  Concerta 54 Mg Cr-Tabs (Methylphenidate Hcl) .[name].. 1 Once Daily 4)  Cardizem Cd 240 Mg  Cp24 (Diltiazem Hcl Coated Beads) .[name].. 1 Once Daily 5)  Adult Aspirin Low Strength 81 Mg  Tbdp (Aspirin) .[name].. 1 Once Daily 6)  Niaspan 1000 Mg  Tbcr (Niacin (Antihyperlipidemic)) .[name].. 1 1/2 Once Daily (1500mg ) 7)  Vytorin 10-80 Mg  Tabs (Ezetimibe-Simvastatin) .... Take 1 Tablet By Mouth Once A Day 8)  Singulair 10 Mg  Tabs (Montelukast Sodium) .... Take 1 Tablet By Mouth Once A Day 9)  Plavix 75 Mg  Tabs (Clopidogrel Bisulfate) .... Use As Directed 10)  Mobic 15 Mg  Tabs (Meloxicam) .... Take 1 Tablet By Mouth Once A Day 11)  Xanax 0.5 Mg  Tabs (Alprazolam) .... As Needed 12)  Meclizine Hcl 25 Mg  Tabs (Meclizine Hcl) .... As Needed 13)  Nitroquick 0.4 Mg  Subl (Nitroglycerin) .[name].. 1 Under Tongue Every 5 Min X3 14)  Ventolin Hfa 108 (90 Base) Mcg/act Aers (Albuterol Sulfate) .... 2 Puffs Every 4 Hours As Needed 15)  Zegerid 40-1100 Mg  Caps (Omeprazole-Sodium Bicarbonate) .[name].. 1 Capsule By Mouth Once Daily 16)  Astelin 137 Mcg/spray Soln (Azelastine Hcl) .... 2 Sprays Each Nostril Two Times A Day  17)  Symbicort 160-4.5 Mcg/act Aero (Budesonide-Formoterol Fumarate) .... 2 Puffs and Rinse Well Twice Daily Instead of Advair 18)  Benzonatate 200 Mg Caps (Benzonatate) .[name].. 1 By Mouth Three Times A Day As Needed Cough 19)  Allergy Vaccine 1:10 Gh 20)  Mucinex 600 Mg Xr12h-Tab (Guaifenesin) .... 2 Every 12 Hours As Needed 21)  Nexium 40 Mg Cpdr (Esomeprazole Magnesium) .[name].. 1 Once Daily 22)  Xyzal 5 Mg Tabs (Levocetirizine  Dihydrochloride) .[name].. 1 By Mouth Daily As Needed 23)  Pristiq 50 Mg Xr24h-Tab (Desvenlafaxine Succinate) .... Take 1 By Mouth Once Daily  Allergies (verified): No Known Drug Allergies  Past History:  Past Medical History: Last updated: 03/24/2008 CAD- Dr. 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   Risk Factors: Smoking Status: never (03/19/2010)  Social History: Patient never smoked.  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   Review of Systems      See HPI       The patient complains of headaches, nasal congestion/difficulty breathing through nose, and sneezing.  The patient denies shortness of breath with activity, shortness of breath at rest, productive cough, non-productive cough, coughing up blood, chest pain, irregular heartbeats, acid heartburn, indigestion, loss of appetite, weight change, abdominal pain, difficulty swallowing, sore throat, tooth/dental problems, hand/feet swelling, joint stiffness or pain, rash, change in color of mucus, and fever.    Vital Signs:  Patient profile:   65 year old male Height:      70 inches Weight:      165.25 pounds BMI:     23.80 O2 Sat:      97 % on Room air Pulse rate:   91 / minute BP sitting:   130 / 76  (left arm) Cuff size:   regular  Vitals Entered By: Reynaldo Minium CMA (March 19, 2010 9:50 AM)  O2 Flow:  Room air CC: Accute visit-Vertigo; Head in pressure x 1 week."can't tell a  difference with allergy shots".   Physical Exam  Additional Exam:  General: A/Ox3; pleasant and cooperative, NAD, looks relaxed. SKIN: no rash, lesions NODES: no lymphadenopathy HEENT: [place]/AT, EOM- WNL, Conjuctivae- clear, PERRLA, TM-WNL, Nose- clear, Throat-  not red, but frequent throat clearing, hoarseness, Mallampati II NECK: Supple w/ fair ROM, JVD- none, normal carotid impulses w/o bruits. Thyroid-  CHEST:  Clear to P&A, but harsh episodic cough. HEART: RRR, no m/g/r heard ABDOMEN: Soft and nl ZOX:WRUE, nl pulses, no edema  NEURO: Grossly intact to observation, no nystagmus      Impression & Recommendations:  Problem # 1:  ALLERGIC RHINITIS (ICD-477.9)  We discussed allergy vaccine for rhinitis with incidental hope for the vertigo. He has seen the right people. I will try increasing his allergy vaccine to 1:2. His updated medication list for this problem includes:    Astelin 137 Mcg/spray Soln (Azelastine hcl) .[name]... 2 sprays each nostril two times a day    Xyzal 5 Mg Tabs (Levocetirizine dihydrochloride) .[name]... 1 by mouth daily as needed    Cetirizine Hcl 10  Mg Tabs (Cetirizine hcl) .[name]... 1 each evening at bedtime  Problem # 2:  ASTHMA (ICD-493.90) Good control. His attention is on these episodic vetigo complaints.   Problem # 3:  BENIGN POSITIONAL VERTIGO (ICD-386.11)  This is only possibly related to his allergy status, but that is one of the few areas left that can be manipulated for theraperutic trial. We will have him try maintenance zyrtec.  Orders: Est. Patient Level IV ([address]109)  Medications Added to Medication List This Visit: 1)  Allergy Vaccine 1:2  2)  Cetirizine Hcl 10 Mg Tabs (Cetirizine hcl) .[name].. 1 each evening at bedtime  Patient Instructions: 1)  Please schedule a follow-up appointment in 3 months. 2)  I will have the allergy lab advance your vaccine to 1:2 at next order. 3)  Try generic Zyrtec (cetirizine) each night at bedtime to see if that helps  the vertigo.  4)  Flu vax   Prescriptions: CETIRIZINE HCL 10 MG TABS (CETIRIZINE HCL) 1 each evening at bedtime  #30 x prn   Entered and Authorized by:   Waymon Budge MD   Signed by:   Waymon Budge MD on 03/19/2010   Method used:   Historical   RxID:   [id number]     Appended Document: SICK/KLW-FLU SHOT Flu Vaccine Consent Questions     Do you have a history of severe allergic reactions to this vaccine? no    Any prior history of allergic reactions to egg and/or gelatin? no    Do you have a sensitivity to the preservative Thimersol? no    Do you have a past history of Guillan-Barre Syndrome? no    Do you currently have an acute febrile illness? no    Have you ever had a severe reaction to latex? no    Vaccine information given and explained to patient? yes    Are you currently pregnant? no    Lot Number:[id number]   Exp Date:12/08/2010   Site Given  Left Deltoid IM  Reynaldo Minium CMA  March 19, 2010 4:56 PM   Clinical Lists Changes  Orders: Added new Service order of Admin 1st Vaccine (95621) - Signed Added new Service order of Flu Vaccine 49yrs + [phone]) - Signed Observations: Added new observation of FLU VAX VIS: 01/02/10 version (03/19/2010 16:56) Added new observation of FLU VAXLOT: [id number] (03/19/2010 16:56) Added new observation of FLU VAXMFR: [employer] (03/19/2010 16:56) Added new observation of FLU VAX EXP: 12/08/2010 (03/19/2010 16:56) Added new observation of FLU VAX DSE: 0.11ml (03/19/2010 16:56) Added new observation of FLU VAX: Fluvax 3+ (03/19/2010 16:56)

## 2010-07-10 NOTE — Letter (Signed)
Summary: Letter from patient  Letter from patient   Imported By: Lester [place] 09/26/2009 09:29:28  _____________________________________________________________________  External Attachment:    Type:   Image     Comment:   External Document

## 2010-07-10 NOTE — Miscellaneous (Signed)
Summary: Discharged/Pulmonary Program  Discharged/Pulmonary Program   Imported By: Lester [place] 11/20/2009 10:23:30  _____________________________________________________________________  External Attachment:    Type:   Image     Comment:   External Document

## 2010-07-10 NOTE — Progress Notes (Signed)
Summary: requests to be seen today  Phone Note Call from Patient Call back at Home Phone [phone]   Caller: Patient Call For: young Summary of Call: pt c/o non-productive cough x 4 days. denies fever, pt requests to be seen today- tp not here- please advise.  Initial call taken by: Tivis Ringer, CNA,  July 18, 2009 9:15 AM  Follow-up for Phone Call        pt c/o non-productive cough, head congestion, body aches, denies fever. Pt has been using mucinex and netti pot with no relief. Pt has also been using benzonatate for his cough with no relief. pt requesting something for cough. Please advise. Carron Curie CMA  July 18, 2009 9:30 AM Allergies: NKDA  Additional Follow-up for Phone Call Additional follow up Details #1::        Please have pt come in for 415 appt- be here at 4pm.Katie Crestwood [facility] CMA  July 18, 2009 10:02 AM   pt schedueld. Carron Curie CMA  July 18, 2009 10:08 AM

## 2010-07-10 NOTE — Progress Notes (Signed)
Summary: refill  Phone Note Call from Patient   Caller: Patient Call For: young Summary of Call: calling for prescript for singulair refill. 90 day supply. [facility] Initial call taken by: Rickard Patience,  April 19, 2010 8:37 AM  Follow-up for Phone Call        Pt informed that refill for Singulairwas sent to pharmacy. Abigail Miyamoto RN  April 19, 2010 8:51 AM     Prescriptions: SINGULAIR 10 MG  TABS (MONTELUKAST SODIUM) Take 1 tablet by mouth once a day  #90 x 3   Entered by:   Abigail Miyamoto RN   Authorized by:   Waymon Budge MD   Signed by:   Abigail Miyamoto RN on 04/19/2010   Method used:   Faxed to ...       [facility] (mail-order)             , [place]         Ph: [phone]       Fax: [phone]   RxID:   [id number]

## 2010-07-10 NOTE — Progress Notes (Signed)
Summary: wheezing, non-productive cough - ok for pred taper per CDY  Phone Note Call from Patient Call back at Home Phone [phone]   Caller: Patient Call For: young Summary of Call: pt was seen on 8/1. says he is not better yet and is wheezing "a lot" please advise[facility] [address]  Initial call taken by: Tivis Ringer, CNA,  January 15, 2010 3:46 PM  Follow-up for Phone Call        Spoke with pt.  He states that he is no better since seen on 8/1- still coughing "getting worse"- non prod cough.  Also he has been wheezing and having increased SOB x 3 days.  Wheezing during the day with inactivity and seems to get worse when lies down.  Pls advise thanks! NKDA  Follow-up by: Vernie Murders,  January 15, 2010 3:52 PM  Additional Follow-up for Phone Call Additional follow up Details #1::        per CDY: okay for pred taper.  prednisone 10mg   #20 4tabsx2days, 3x2days, 2x2days, 1x2days and stop.  called spoke with patient, informed of CDY's recs.  pt verbalized his understanding.  [facility] [address] Additional Follow-up by: Boone Master CNA/MA,  January 15, 2010 5:17 PM    New/Updated Medications: PREDNISONE 10 MG TABS (PREDNISONE) 4tabsx2days, 3x2days, 2x2days, 1x2days and stop. Prescriptions: PREDNISONE 10 MG TABS (PREDNISONE) 4tabsx2days, 3x2days, 2x2days, 1x2days and stop.  #20 x 0   Entered by:   Boone Master CNA/MA   Authorized by:   Waymon Budge MD   Signed by:   Boone Master CNA/MA on 01/15/2010   Method used:   [facility]  [address] [id number]       [address]       [place], [place]  [address]       Ph: [phone] or [phone]       Fax: [phone]   RxID:   [id number]

## 2010-07-10 NOTE — Assessment & Plan Note (Signed)
Summary: coughing/ mbw   Primary Provider/Referring Provider:  Susann Givens  CC:  Accute visit-cough since friday(non productive)nausea feelings yesterday(thinks due to not eating)..  History of Present Illness: July 18, 2009- Allergic rhinitis, Asthma, Cough, GERD Hard cough for past week without fever or sore throat. Sinus pain with little return from Neti pot.  Body aches last week for one day with fever, then resolved. Wheeze. Had flu vax. Dry cough. He will be restarting pulmonary rehab but deferred today because not feeling well.  September 18, 2009- Allergic rhinitis, Asthma, Cough, GERD, Vertigo Having new problem with vertigo x 4 weeks recently- has seen Dr Susann Givens. Denies significant nasal congestion. This has not  been related to his asthma control. Often flares associated with occipital headache. He is also being evaluated for hearing loss by a hearing aid distributor. Pulmonary rehab has helped him manage his asthma better and he says he has not had a significant episode lately. Discussed pollen induced congestion/ eustachian dysfunction as a possible cause. ow using meclizine. No ear pain.  January 08, 2010- Allergic rhinitis, Asthma, Cough, GERD, Vertigo Outdoors a lot this weekend. Began feeling more short of breath. Harsh cough began yesterday. Throat sore from coughing. Denies sick contact. No fever but feels somewhat aching. He has been  back on allergy shots since 9/09. I explained that pollen levels are low and most of what is bothering people is air quality now not allergy. He is continuing nexium and zegerid plus sucralfate for reflux. Benzonatate helps. He still gets hoarse in choir.   Asthma History    Initial Asthma Severity Rating:    Age range: 12+ years    Symptoms: 0-2 days/week    Nighttime Awakenings: 0-2/month    Interferes w/ normal activity: no limitations    SABA use (not for EIB): 0-2 days/week    Asthma Severity Assessment: Intermittent   Preventive  Screening-Counseling & Management  Alcohol-Tobacco     Smoking Status: never  Current Medications (verified): 1)  Wellbutrin Xl 300 Mg  Tb24 (Bupropion Hcl) .[name].. 1 Once Daily 2)  Paroxetine Hcl 20 Mg  Tabs (Paroxetine Hcl) .... 1/2 Once Daily 3)  Concerta 54 Mg Cr-Tabs (Methylphenidate Hcl) .[name].. 1 Once Daily 4)  Cardizem Cd 240 Mg  Cp24 (Diltiazem Hcl Coated Beads) .[name].. 1 Once Daily 5)  Adult Aspirin Low Strength 81 Mg  Tbdp (Aspirin) .[name].. 1 Once Daily 6)  Niaspan 1000 Mg  Tbcr (Niacin (Antihyperlipidemic)) .[name].. 1 1/2 Once Daily (1500mg ) 7)  Vytorin 10-80 Mg  Tabs (Ezetimibe-Simvastatin) .... Take 1 Tablet By Mouth Once A Day 8)  Singulair 10 Mg  Tabs (Montelukast Sodium) .... Take 1 Tablet By Mouth Once A Day 9)  Plavix 75 Mg  Tabs (Clopidogrel Bisulfate) .... Use As Directed 10)  Mobic 15 Mg  Tabs (Meloxicam) .... Take 1 Tablet By Mouth Once A Day 11)  Xanax 0.5 Mg  Tabs (Alprazolam) .... As Needed 12)  Meclizine Hcl 25 Mg  Tabs (Meclizine Hcl) .... As Needed 13)  Nitroquick 0.4 Mg  Subl (Nitroglycerin) .[name].. 1 Under Tongue Every 5 Min X3 14)  Ventolin Hfa 108 (90 Base) Mcg/act Aers (Albuterol Sulfate) .... 2 Puffs Every 4 Hours As Needed 15)  Zegerid 40-1100 Mg  Caps (Omeprazole-Sodium Bicarbonate) .[name].. 1 Capsule By Mouth Once Daily 16)  Astelin 137 Mcg/spray Soln (Azelastine Hcl) .... 2 Sprays Each Nostril Two Times A Day 17)  Symbicort 160-4.5 Mcg/act Aero (Budesonide-Formoterol Fumarate) .... 2 Puffs and Rinse Well Twice  Daily Instead of Advair 18)  Benzonatate 200 Mg Caps (Benzonatate) .[name].. 1 By Mouth Three Times A Day As Needed Cough 19)  Allergy Vaccine 1:10 Gh 20)  Mucinex 600 Mg Xr12h-Tab (Guaifenesin) .... 2 Every 12 Hours As Needed 21)  Nexium 40 Mg Cpdr (Esomeprazole Magnesium) .[name].. 1 Once Daily 22)  Xyzal 5 Mg Tabs (Levocetirizine Dihydrochloride) .[name].. 1 By Mouth Daily As Needed 23)  Pristiq 50 Mg Xr24h-Tab (Desvenlafaxine Succinate) .... Take 1 By Mouth Once  Daily  Allergies (verified): No Known Drug Allergies  Review of Systems      See HPI       The patient complains of non-productive cough.  The patient denies shortness of breath with activity, shortness of breath at rest, productive cough, chest pain, irregular heartbeats, acid heartburn, indigestion, loss of appetite, weight change, abdominal pain, difficulty swallowing, sore throat, tooth/dental problems, headaches, nasal congestion/difficulty breathing through nose, and sneezing.    Vital Signs:  Patient profile:   65 year old male Height:      70 inches Weight:      181 pounds BMI:     26.06 O2 Sat:      94 % on Room air Pulse rate:   103 / minute BP sitting:   124 / 72  (right arm) Cuff size:   regular  Vitals Entered By: Reynaldo Minium CMA (January 08, 2010 2:52 PM)  O2 Flow:  Room air CC: Accute visit-cough since friday(non productive)nausea feelings yesterday(thinks due to not eating).   Physical Exam  Additional Exam:  General: A/Ox3; pleasant and cooperative, NAD, SKIN: no rash, lesions NODES: no lymphadenopathy HEENT: [place]/AT, EOM- WNL, Conjuctivae- clear, PERRLA, TM-WNL, Nose- clear, Throat-  not red, but frequent throat clearing, hoarseness, Mallampati II NECK: Supple w/ fair ROM, JVD- none, normal carotid impulses w/o bruits. Thyroid-  CHEST:  Clear to P&A, but harsh episodic cough. HEART: RRR, no m/g/r heard ABDOMEN: Soft and nl IHK:VQQV, nl pulses, no edema  NEURO: Grossly intact to observation, no nystagmus      Impression & Recommendations:  Problem # 1:  ASTHMATIC BRONCHITIS, ACUTE (ICD-466.0)  This is a  nonspecific tracheobronchitis- viral vs allergic vs reflux. Try Dialresp as an airway antiinflammatory. His updated medication list for this problem includes:    Singulair 10 Mg Tabs (Montelukast sodium) .[name]... Take 1 tablet by mouth once a day    Ventolin Hfa 108 (90 Base) Mcg/act Aers (Albuterol sulfate) .[name]... 2 puffs every 4 hours as needed     Symbicort 160-4.5 Mcg/act Aero (Budesonide-formoterol fumarate) .[name]... 2 puffs and rinse well twice daily instead of advair    Benzonatate 200 Mg Caps (Benzonatate) .[name]... 1 by mouth three times a day as needed cough    Mucinex 600 Mg Xr12h-tab (Guaifenesin) .[name]... 2 every 12 hours as needed  Problem # 2:  GERD (ICD-530.81) Being heavily medicated for GERD, which makes that less likely as a basis for his current cough and congestion, His updated medication list for this problem includes:    Zegerid 40-1100 Mg Caps (Omeprazole-sodium bicarbonate) .[name]... 1 capsule by mouth once daily    Nexium 40 Mg Cpdr (Esomeprazole magnesium) .[name]... 1 once daily  Other Orders: Est. Patient Level III (95638)  Patient Instructions: 1)  Please schedule a follow-up appointment in 6 months or earlier if needed. 2)  Try sample Dialresp 500 micrograms - 1 daily as an airway antiinflammatory.

## 2010-07-10 NOTE — Miscellaneous (Signed)
Summary: Injection Orders / [facility]    Injection Orders / [facility]    Imported By: Lennie Odor 04/17/2010 10:46:38  _____________________________________________________________________  External Attachment:    Type:   Image     Comment:   External Document

## 2010-07-10 NOTE — Progress Notes (Signed)
Summary: New Rx Ins. won't pay for current one.  Phone Note Other Incoming   Caller: Pt. came in for shot Details for Reason: Ins. won't pay for clarinex. Summary of Call: [name] would like for you to call in another antihistimine for him. His ins. will not cover clarinex. [facility] [address] [phone]. Please call pt. and let him know what you advise. Initial call taken by: Dimas Millin,  July 25, 2009 1:38 PM  Follow-up for Phone Call        Ask him to check with his insurance and find out their preferred antihistamine list. He can let us know which from that list he would like to use. Follow-up by: Waymon Budge MD,  July 25, 2009 5:54 PM  Additional Follow-up for Phone Call Additional follow up Details #1::        advised pt to call insurance and ask what meds will be covered instead of clarinex and let us know, and then CY can choose the best alternative. Pt states he will do so and call back with list.  Carron Curie CMA  July 26, 2009 8:59 AM

## 2010-07-10 NOTE — Assessment & Plan Note (Signed)
Summary: congestion//ok per CY//jrc   Primary Provider/Referring Provider:  Susann Givens  CC:  Accute visit-deep chest congestion; fatigued. Wheezing. Last Tuesday had bodyaches.  History of Present Illness: June 23, 2009- GERD, allergic rhinitis, asthma, cough [place] went to ER for dyspnea. CXR was neg. Given neb, injection and antibiotic. Has had 2 similar episodes since "didn't feel well, couldn't rest" due to tiredness and short of breath with occasional cough but no wheeze. Benzonatate controls cough. Dry cough, no phlegm, chest pain or discolored. Asks script Clarinex. No edema, palpitation, leg pain or swelling. Admits he gets anxious, managed with Xanax or relaxation. Denies feeling anxious. He asks about pulmonary rehab here. [facility] in 2009[facility] rehab after his MI. Dr Donnie Aho would have done any echocardiogram- not recalled.   He "didn't pass out, but close- tingling" during PFT/ [facility]  July 18, 2009- Allergic rhinitis, Asthma, Cough, GERD Hard cough for past week without fever or sore throat. Sinus pain with little return from Neti pot.  Body aches last week for one day with fever, then resolved. Wheeze. Had flu vax. Dry cough. He will be restarting pulmonary rehab but deferred today because not feeling well.    Current Medications (verified): 1)  Wellbutrin Xl 300 Mg  Tb24 (Bupropion Hcl) .[name].. 1 Once Daily 2)  Paroxetine Hcl 20 Mg  Tabs (Paroxetine Hcl) .... 1/2 Once Daily 3)  Concerta 54 Mg Cr-Tabs (Methylphenidate Hcl) .[name].. 1 Once Daily 4)  Cardizem Cd 240 Mg  Cp24 (Diltiazem Hcl Coated Beads) .[name].. 1 Once Daily 5)  Adult Aspirin Low Strength 81 Mg  Tbdp (Aspirin) .[name].. 1 Once Daily 6)  Niaspan 1000 Mg  Tbcr (Niacin (Antihyperlipidemic)) .[name].. 1 1/2 Once Daily (1500mg ) 7)  Vytorin 10-80 Mg  Tabs (Ezetimibe-Simvastatin) .... Take 1 Tablet By Mouth Once A Day 8)  Singulair 10 Mg  Tabs (Montelukast Sodium) .... Take  1 Tablet By Mouth Once A Day 9)  Plavix 75 Mg  Tabs (Clopidogrel Bisulfate) .... Use As Directed 10)  Mobic 15 Mg  Tabs (Meloxicam) .... Take 1 Tablet By Mouth Once A Day 11)  Xanax 0.5 Mg  Tabs (Alprazolam) .... As Needed 12)  Meclizine Hcl 25 Mg  Tabs (Meclizine Hcl) .... As Needed 13)  Nitroquick 0.4 Mg  Subl (Nitroglycerin) .[name].. 1 Under Tongue Every 5 Min X3 14)  Ventolin Hfa 108 (90 Base) Mcg/act Aers (Albuterol Sulfate) .... 2 Puffs Every 4 Hours As Needed 15)  Zegerid 40-1100 Mg  Caps (Omeprazole-Sodium Bicarbonate) .[name].. 1 Capsule By Mouth Once Daily 16)  Astelin 137 Mcg/spray Soln (Azelastine Hcl) .... 2 Sprays Each Nostril Two Times A Day 17)  Symbicort 160-4.5 Mcg/act Aero (Budesonide-Formoterol Fumarate) .... 2 Puffs and Rinse Well Twice Daily Instead of Advair 18)  Benzonatate 200 Mg Caps (Benzonatate) .[name].. 1 By Mouth Three Times A Day As Needed Cough 19)  Allergy Vaccine 1:10 Gh 20)  Mucinex 600 Mg Xr12h-Tab (Guaifenesin) .... 2 Every 12 Hours As Needed 21)  Nexium 40 Mg Cpdr (Esomeprazole Magnesium) .[name].. 1 Once Daily 22)  Clarinex 5 Mg Tabs (Desloratadine) .[name].. 1 By Mouth Once Daily As Needed 23)  Pristiq 50 Mg Xr24h-Tab (Desvenlafaxine Succinate) .... Take 1 By Mouth Once Daily  Allergies (verified): No Known Drug Allergies  Past History:  Past Medical History: Last updated: 03/24/2008 CAD- Dr. 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  Risk Factors: Smoking Status: never (02/16/2008)  Review of Systems      See HPI       The patient complains of prolonged cough.   The patient denies anorexia, weight loss, weight gain, vision loss, decreased hearing, hoarseness, chest pain, syncope, dyspnea on exertion, peripheral edema, headaches, hemoptysis, abdominal pain, and severe indigestion/heartburn.    Vital Signs:  Patient profile:   65 year old male Height:      70 inches Weight:      176 pounds BMI:     25.34 O2 Sat:      94 % on Room air Pulse rate:   112 / minute BP sitting:   134 / 80  (left arm) Cuff size:   regular  Vitals Entered By: Reynaldo Minium CMA (July 18, 2009 4:52 PM)  O2 Flow:  Room air  Physical Exam  Additional Exam:  General: A/Ox3; pleasant and cooperative, NAD, SKIN: no rash, lesions NODES: no lymphadenopathy HEENT: [place]/AT, EOM- WNL, Conjuctivae- clear, PERRLA, TM-WNL, Nose- watery drainage Throat- red, hoarseness.Melampatti II NECK: Supple w/ fair ROM, JVD- none, normal carotid impulses w/o bruits Thyroid-  CHEST: Harsh cough, otherwise clear chest HEART: RRR, no m/g/r heard ABDOMEN: Soft and nl ONG:EXBM, nl pulses, no edema  NEURO: Grossly intact to observation      Impression & Recommendations:  Problem # 1:  ASTHMA (ICD-493.90) Acute bronchitis syndrome, consistent with blunted influenza syndrome. We discussed and will try tamiflu with cortisone, fluids and cough syrup.  Medications Added to Medication List This Visit: 1)  Pristiq 50 Mg Xr24h-tab (Desvenlafaxine succinate) .... Take 1 by mouth once daily 2)  Hydrocodone-homatropine 5-1.5 Mg/36ml Syrp (Hydrocodone-homatropine) .[name].. 1 teaspoon four times a day as needed cough 3)  Tamiflu 75 Mg Caps (Oseltamivir phosphate) .[name].. 1 two times a day x 5 days  Other Orders: Est. Patient Level II (84132)  Patient Instructions: 1)  Keep scheduled appointment- earlier if needed 2)  scripts for cough syrup and Tamiflu 3)  Extra fluids, tylenol and comfort measures as you find what works for you. Prescriptions: TAMIFLU 75 MG CAPS (OSELTAMIVIR PHOSPHATE) 1 two times a  day x 5 days  #10 x 0   Entered and Authorized by:   Waymon Budge MD   Signed by:   Waymon Budge MD on 07/18/2009   Method used:   Print then Give to Patient   RxID:   [id number] HYDROCODONE-HOMATROPINE 5-1.5 MG/5ML SYRP (HYDROCODONE-HOMATROPINE) 1 teaspoon four times a day as needed cough  #200 ml x 0   Entered and Authorized by:   Waymon Budge MD   Signed by:   Waymon Budge MD on 07/18/2009   Method used:   Print then Give to Patient   RxID:   [id number]

## 2010-07-10 NOTE — Miscellaneous (Signed)
Summary: Injection Record/[facility]  Injection Record/[facility]   Imported By: Sherian Rein 10/12/2009 13:48:11  _____________________________________________________________________  External Attachment:    Type:   Image     Comment:   External Document

## 2010-07-10 NOTE — Assessment & Plan Note (Signed)
Summary: 6 months/apc   Primary Provider/Referring Provider:  Susann Givens  CC:  6 month follow up visit-vertigo episodes again 3 in the past 4 weeks. [name]  History of Present Illness: June 23, 2009- GERD, allergic rhinitis, asthma, cough [place] ago went to ER for dyspnea. CXR was neg. Given neb, injection and antibiotic. Has had 2 similar episodes since "didn't feel well, couldn't rest" due to tiredness and short of breath with occasional cough but no wheeze. Benzonatate controls cough. Dry cough, no phlegm, chest pain or discolored. Asks script Clarinex. No edema, palpitation, leg pain or swelling. Admits he gets anxious, managed with Xanax or relaxation. Denies feeling anxious. He asks about pulmonary rehab here. His [facility] was [facility] in 2009[facility] cardiac rehab after his MI. Dr Donnie Aho would have done any echocardiogram- not recalled.   He "didn't pass out, but close- tingling" during PFT/ [facility]  July 18, 2009- Allergic rhinitis, Asthma, Cough, GERD Hard cough for past week without fever or sore throat. Sinus pain with little return from Neti pot.  Body aches last week for one day with fever, then resolved. Wheeze. Had flu vax. Dry cough. He will be restarting pulmonary rehab but deferred today because not feeling well.  September 18, 2009- Allergic rhinitis, Asthma, Cough, GERD, Vertigo Having new problem with vertigo x 4 weeks recently- has seen Dr Susann Givens. Denies significant nasal congestion. This has not  been related to his asthma control. Often flares associated with occipital headache. He is also being evaluated for hearing loss by a hearing aid distributor. Pulmonary rehab has helped him manage his asthma better and he says he has not had a significant episode lately. Discussed pollen induced congestion/ eustachian dysfunction as a possible cause. ow using meclizine. No ear pain.     Current Medications (verified): 1)  Wellbutrin Xl 300 Mg  Tb24  (Bupropion Hcl) .[name].. 1 Once Daily 2)  Paroxetine Hcl 20 Mg  Tabs (Paroxetine Hcl) .... 1/2 Once Daily 3)  Concerta 54 Mg Cr-Tabs (Methylphenidate Hcl) .[name].. 1 Once Daily 4)  Cardizem Cd 240 Mg  Cp24 (Diltiazem Hcl Coated Beads) .[name].. 1 Once Daily 5)  Adult Aspirin Low Strength 81 Mg  Tbdp (Aspirin) .[name].. 1 Once Daily 6)  Niaspan 1000 Mg  Tbcr (Niacin (Antihyperlipidemic)) .[name].. 1 1/2 Once Daily (1500mg ) 7)  Vytorin 10-80 Mg  Tabs (Ezetimibe-Simvastatin) .... Take 1 Tablet By Mouth Once A Day 8)  Singulair 10 Mg  Tabs (Montelukast Sodium) .... Take 1 Tablet By Mouth Once A Day 9)  Plavix 75 Mg  Tabs (Clopidogrel Bisulfate) .... Use As Directed 10)  Mobic 15 Mg  Tabs (Meloxicam) .... Take 1 Tablet By Mouth Once A Day 11)  Xanax 0.5 Mg  Tabs (Alprazolam) .... As Needed 12)  Meclizine Hcl 25 Mg  Tabs (Meclizine Hcl) .... As Needed 13)  Nitroquick 0.4 Mg  Subl (Nitroglycerin) .[name].. 1 Under Tongue Every 5 Min X3 14)  Ventolin Hfa 108 (90 Base) Mcg/act Aers (Albuterol Sulfate) .... 2 Puffs Every 4 Hours As Needed 15)  Zegerid 40-1100 Mg  Caps (Omeprazole-Sodium Bicarbonate) .[name].. 1 Capsule By Mouth Once Daily 16)  Astelin 137 Mcg/spray Soln (Azelastine Hcl) .... 2 Sprays Each Nostril Two Times A Day 17)  Symbicort 160-4.5 Mcg/act Aero (Budesonide-Formoterol Fumarate) .... 2 Puffs and Rinse Well Twice Daily Instead of Advair 18)  Benzonatate 200 Mg Caps (Benzonatate) .[name].. 1 By Mouth Three Times A Day As Needed Cough  19)  Allergy Vaccine 1:10 Gh 20)  Mucinex 600 Mg Xr12h-Tab (Guaifenesin) .... 2 Every 12 Hours As Needed 21)  Nexium 40 Mg Cpdr (Esomeprazole Magnesium) .[name].. 1 Once Daily 22)  Xyzal 5 Mg Tabs (Levocetirizine Dihydrochloride) .[name].. 1 By Mouth Daily As Needed 23)  Pristiq 50 Mg Xr24h-Tab (Desvenlafaxine Succinate) .... Take 1 By Mouth Once Daily  Allergies (verified): No Known Drug Allergies  Past History:  Past Medical History: Last updated: 03/24/2008 CAD- Dr. 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  Risk Factors: Smoking Status: never (02/16/2008)  Review of Systems      See HPI       The patient complains of headaches.  The patient denies anorexia, fever, weight loss, weight gain, vision loss, decreased hearing, hoarseness, chest pain, syncope, dyspnea on exertion, peripheral edema, prolonged cough, hemoptysis, abdominal pain, and severe indigestion/heartburn.    Vital Signs:  Patient profile:   65 year old male Height:      70 inches Weight:      187 pounds BMI:     26.93 O2 Sat:      98 % on Room air Pulse rate:   84 / minute BP sitting:   122 / 84  (left arm) Cuff size:   regular  Vitals Entered By: Reynaldo Minium CMA (September 18, 2009 9:07 AM)  O2 Flow:  Room air  Physical Exam  Additional Exam:  General: A/Ox3; pleasant and cooperative, NAD, SKIN: no rash, lesions NODES: no lymphadenopathy HEENT: [place]/AT, EOM- WNL, Conjuctivae- clear, PERRLA, TM-WNL, Nose- clear, Throat- red, hoarseness, Mallampati II NECK: Supple w/ fair ROM, JVD- none, normal carotid impulses w/o bruits. Thyroid-  CHEST:  Clear to P&A HEART: RRR, no m/g/r heard ABDOMEN: Soft and nl ZOX:WRUE, nl pulses, no edema  NEURO: Grossly intact to observation, no nystagmus      Impression & Recommendations:  Problem # 1:  ALLERGIC RHINITIS (ICD-477.9)  I can't really tell that his rhinitis is related to the vertigo, pointing out that meclizine is an antihistamine. Today he feels well. We discussed pollen season expectations. I suggested there might be a connection between  hearing loss and vertigo, and he might want to ask Dr Susann Givens about ENT eval if things don't go well. His updated medication list for this problem includes:    Astelin 137 Mcg/spray Soln (Azelastine hcl) .[name]... 2 sprays each nostril two times a day    Xyzal 5 Mg Tabs (Levocetirizine dihydrochloride) .[name]... 1 by mouth daily as needed  Problem # 2:  ASTHMA (ICD-493.90) Good control now. He will finish up at his pulmonary rehab.  Other Orders: Est. Patient Level III (45409)  Patient Instructions: 1)  Please schedule a follow-up appointment in 6 months.

## 2010-07-10 NOTE — Progress Notes (Signed)
Summary: Change Clarinex to Xyzal  Phone Note Call from Patient   Caller: Patient Call For: young Summary of Call: insurance will cover xyzal [facility] [address] Initial call taken by: Rickard Patience,  July 26, 2009 10:27 AM  Follow-up for Phone Call        pt insurance will cover xyzal instead of clarinex. Please advise on dosage and directions. Thanks. Carron Curie CMA  July 26, 2009 10:36 AM  per [facility] ok for xyzal 5mg  1 once daily as needed #30 with as needed refills Follow-up by: Reynaldo Minium CMA,  July 26, 2009 10:49 AM  Additional Follow-up for Phone Call Additional follow up Details #1::        LMOM letting the patient know [facility] is changing the Clarinex to Xyzal and we are sending RX to his pharmacy.Michel Bickers CMA  July 26, 2009 10:54 AM    New/Updated Medications: XYZAL 5 MG TABS (LEVOCETIRIZINE DIHYDROCHLORIDE) 1 by mouth daily as needed Prescriptions: XYZAL 5 MG TABS (LEVOCETIRIZINE DIHYDROCHLORIDE) 1 by mouth daily as needed  #30 x 6   Entered by:   Michel Bickers CMA   Authorized by:   Waymon Budge MD   Signed by:   Michel Bickers CMA on 07/26/2009   Method used:   Electronically to        [facility]  [address] [id number] ([facility])       [address]       [place], [place]  [address]       Ph: [phone] or [phone]       Fax: [phone]   RxID:   [id number]

## 2010-07-10 NOTE — Miscellaneous (Signed)
Summary: Injection Record / [facility]    Injection Record / [facility]    Imported By: Lennie Odor 02/09/2010 10:28:21  _____________________________________________________________________  External Attachment:    Type:   Image     Comment:   External Document

## 2010-07-10 NOTE — Miscellaneous (Signed)
Summary: Injection Record/[facility]  Injection Record/[facility]   Imported By: Sherian Rein 10/31/2009 12:49:12  _____________________________________________________________________  External Attachment:    Type:   Image     Comment:   External Document

## 2010-07-10 NOTE — Miscellaneous (Signed)
Summary: Order for Pulm Rehab/[facility]  Order for Pulm Rehab/[facility]   Imported By: Sherian Rein 10/10/2009 08:57:55  _____________________________________________________________________  External Attachment:    Type:   Image     Comment:   External Document

## 2010-07-10 NOTE — Miscellaneous (Signed)
Summary: Injection Orders / [facility]    Injection Orders / [facility]    Imported By: Lennie Odor 11/07/2009 16:01:47  _____________________________________________________________________  External Attachment:    Type:   Image     Comment:   External Document

## 2010-07-10 NOTE — Miscellaneous (Signed)
Summary: Injection Record/[facility]  Injection Record/[facility]   Imported By: Sherian Rein 10/31/2009 08:37:09  _____________________________________________________________________  External Attachment:    Type:   Image     Comment:   External Document

## 2010-07-10 NOTE — Assessment & Plan Note (Signed)
Summary: 1 month/apc   Primary Provider/Referring Provider:  Susann Givens  CC:  follow up visit-request RX for Clarinex (30 day supply)..  History of Present Illness: April 20, 2009 --Presents for an acute office visit. Complains of asthma flare: wheezing, increased SOB, dry cough x2weeks. OTC not helping. Denies chest pain, dyspnea, orthopnea, hemoptysis, fever, n/v/d, edema, headache.    June 23, 2009- April 20, 2009 --Presents for an acute office visit. Complains of asthma flare: wheezing, increased SOB, dry cough x2weeks. OTC not helping. Denies chest pain, dyspnea, orthopnea, hemoptysis, fever, n/v/d, edema, headache.   May 23, 2009- GERD, allergic rhinitis, asthma, cough Back on Zegerid and nexium- Dr Matthias Hughs. Was having a lot of reflux/ heartburn and wnt to ER for GI cocktail, and also got IV meds. has felt "acidy" a few times since. This past Sunday, he woke with face itching from his "histamine attack"- dry red itching skin-  usually whole body, not just face. Dr Dorita Sciara PA blamed combination of weather and allergies and gave cortisone oint and an unk antihistamine that he hasnt taken yet. Still occ chest tight, need to dry cough, postnasal drip. Feels best if he keeps taking benzonatate and antihistamine. Continues allergy vaccine. Long talk about differnet antihistamines and overdrying. He continues daily allegra and may supplement   June 23, 2009- GERD, allergic rhinitis, asthjma, cough [place] went to ER for dyspnea. CXR was neg. Given neb, injection and antibiotic. Has had 2 similar episodes since "didn't feel well, couldn't rest" due to tiredness and short of breath with occasional cough but no wheeze. Benzonatate controls cough. Dry cough, no phlegm, chest pain or discolored. Asks script Clarinex. No edema, palpitation, leg pain or swelling. Admits he gets anxious, managed with Xanax or relaxation. Denies feeling anxious. He asks about pulmonary  rehab here. His P[facility] was WNL in 2009[facility] rehab after his MI. Dr Donnie Aho would have done any echocardiogram- not recalled. h  He "didn't pass out, but close- tingling" during PFT/ [facility]   Current Medications (verified): 1)  Wellbutrin Xl 300 Mg  Tb24 (Bupropion Hcl) .[name].. 1 Once Daily 2)  Paroxetine Hcl 20 Mg  Tabs (Paroxetine Hcl) .... 1/2 Once Daily 3)  Concerta 54 Mg Cr-Tabs (Methylphenidate Hcl) .[name].. 1 Once Daily 4)  Cardizem Cd 240 Mg  Cp24 (Diltiazem Hcl Coated Beads) .[name].. 1 Once Daily 5)  Adult Aspirin Low Strength 81 Mg  Tbdp (Aspirin) .[name].. 1 Once Daily 6)  Niaspan 1000 Mg  Tbcr (Niacin (Antihyperlipidemic)) .[name].. 1 1/2 Once Daily (1500mg ) 7)  Vytorin 10-80 Mg  Tabs (Ezetimibe-Simvastatin) .... Take 1 Tablet By Mouth Once A Day 8)  Singulair 10 Mg  Tabs (Montelukast Sodium) .... Take 1 Tablet By Mouth Once A Day 9)  Plavix 75 Mg  Tabs (Clopidogrel Bisulfate) .... Use As Directed 10)  Mobic 15 Mg  Tabs (Meloxicam) .... Take 1 Tablet By Mouth Once A Day 11)  Xanax 0.5 Mg  Tabs (Alprazolam) .... As Needed 12)  Meclizine Hcl 25 Mg  Tabs (Meclizine Hcl) .... As Needed 13)  Nitroquick 0.4 Mg  Subl (Nitroglycerin) .[name].. 1 Under Tongue Every 5 Min X3 14)  Ventolin Hfa 108 (90 Base) Mcg/act Aers (Albuterol Sulfate) .... 2 Puffs Every 4 Hours As Needed 15)  Zegerid 40-1100 Mg  Caps (Omeprazole-Sodium Bicarbonate) .[name].. 1 Capsule By Mouth Once Daily 16)  Astelin 137 Mcg/spray Soln (Azelastine Hcl) .... 2 Sprays Each Nostril Two Times A  Day 17)  Symbicort 160-4.5 Mcg/act Aero (Budesonide-Formoterol Fumarate) .... 2 Puffs and Rinse Well Twice Daily Instead of Advair 18)  Benzonatate 200 Mg Caps (Benzonatate) .[name].. 1 By Mouth Three Times A Day As Needed Cough 19)  Allergy Vaccine 1:10 Gh 20)  Mucinex 600 Mg Xr12h-Tab (Guaifenesin) .... 2 Every 12 Hours As Needed 21)  Nexium 40 Mg Cpdr (Esomeprazole Magnesium) .[name].. 1 Once Daily  Allergies (verified): No Known Drug  Allergies  Past History:  Past Medical History: Last updated: 03/24/2008 CAD- Dr. 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  Risk Factors: Smoking Status: never (02/16/2008)  Review of Systems      See HPI       The patient complains of dyspnea on exertion.  The patient denies anorexia, fever, weight loss, weight gain, vision loss, decreased hearing, hoarseness, chest pain, syncope, peripheral edema, prolonged cough, headaches, hemoptysis, abdominal pain, and severe indigestion/heartburn.    Vital Signs:  Patient profile:   65 year old male Height:      70 inches Weight:      182 pounds BMI:     26.21 O2 Sat:      97 % on Room air Pulse rate:   104 / minute BP sitting:   154 / 92  (left arm) Cuff size:   regular  Vitals Entered By: Reynaldo Minium CMA (June 23, 2009 3:44 PM)  O2 Flow:  Room air  Physical Exam  Additional Exam:  General: A/Ox3; pleasant and cooperative, NAD, SKIN: no rash, lesions NODES: no lymphadenopathy HEENT: [place]/AT, EOM- WNL, Conjuctivae- clear, PERRLA, TM-WNL, Nose- watery drainage Throat- clear and wnl, hoarseness.Melampatti II NECK: Supple w/ fair ROM, JVD- none, normal carotid impulses w/o bruits Thyroid-  CHEST: Coarse BS , no rales or definite wheeze now.  HEART: RRR, no m/g/r heard ABDOMEN: Soft and nl ZOX:WRUE, nl pulses, no edema  NEURO: Grossly intact to observation      Impression & Recommendations:  Problem # 1:  ASTHMA (ICD-493.90) Exertional dyspnea, deconditioning, anxiety and possible cardiac limitiation. I can put him in  touch with the pulmonary rehab program and see what they say. He has trouble with PFT effort that sounds like hyperventilation. We need the [facility] report.  Other Orders: Est. Patient Level III (45409) Rehabilitation Referral (Rehab)  Patient Instructions: 1)  Please schedule a follow-up appointment in 3 months. 2)  See Bjorn Loser about referral to [facility] 3)  We will contact [facility] PFT lab for results of test you had in December.    Appended Document: 1 month/apc Spoke with Marcelino Duster at [facility] PFT; faxing report to [place]; will place in [facility]'s look at for review.

## 2010-07-11 DIAGNOSIS — J301 Allergic rhinitis due to pollen: Secondary | ICD-10-CM

## 2010-07-12 NOTE — Miscellaneous (Signed)
Summary: Injection record  Injection record   Imported By: Lester Williston 06/22/2010 12:23:32  _____________________________________________________________________  External Attachment:    Type:   Image     Comment:   External Document

## 2010-07-12 NOTE — Assessment & Plan Note (Signed)
Summary: 3 mth//jrc   Primary Provider/Referring Provider:  Susann Givens  CC:  3 month follow up visit-allergies..  History of Present Illness: January 08, 2010- Allergic rhinitis, Asthma, Cough, GERD, Vertigo Outdoors a lot this weekend. Began feeling more short of breath. Harsh cough began yesterday. Throat sore from coughing. Denies sick contact. No fever but feels somewhat aching. He has been  back on allergy shots since 9/09. I explained that pollen levels are low and most of what is bothering people is air quality now not allergy. He is continuing nexium and zegerid plus sucralfate for reflux. Benzonatate helps. He still gets hoarse in choir.   March 19, 2010 Allergic rhinitis, Asthma, Cough, GERD, Vertigo Acute visit-Vertigo; Head in pressure x 1 week."can't tell a difference with allergy shots". After good summer, began having more dizziness/ vertigo a week ago. Sometimes feels pressure soreness mid forhead. Thinks he is worse with prolonged time outdoors. Sometimes head feels heavy, worse with movement and gets nauseated. Meclizine helps. Took an otc antihistamine.  Failed inner-ear desensitization positioning.   June 21, 2010- Allergic rhinitis, Asthma, Cough, GERD, Vertigo Nurse-CC: 3 month follow up visit-allergies. Dealing with a gastroenteritis for which he says he was put on a sulfa drug and remains on aggressive GERD management by Dr Matthias Hughs. Otherwise says his breathing issues have been well controlled w/o acute problem this winter. Maintains on Symbicort, singulair. Has used Ventolin 1-2 x/ week  recently, but not at all in 3 weeks. Continuing allergy vaccine now increased to 1:2 with some soreness a few hours after his first shot- to be watched.    Asthma History    Asthma Control Assessment:    Age range: 12+ years    Symptoms: 0-2 days/week    Nighttime Awakenings: 0-2/month    Interferes w/ normal activity: no limitations    SABA use (not for EIB): 0-2 days/week  Asthma Control Assessment: Well Controlled   Preventive Screening-Counseling & Management  Alcohol-Tobacco     Smoking Status: never  Current Medications (verified): 1)  Wellbutrin Xl 300 Mg  Tb24 (Bupropion Hcl) .[name].. 1 Once Daily 2)  Paroxetine Hcl 20 Mg  Tabs (Paroxetine Hcl) .... 1/2 Once Daily 3)  Concerta 54 Mg Cr-Tabs (Methylphenidate Hcl) .[name].. 1 Once Daily 4)  Cardizem Cd 240 Mg  Cp24 (Diltiazem Hcl Coated Beads) .[name].. 1 Once Daily 5)  Adult Aspirin Low Strength 81 Mg  Tbdp (Aspirin) .[name].. 1 Once Daily 6)  Niaspan 1000 Mg  Tbcr (Niacin (Antihyperlipidemic)) .[name].. 1 1/2 Once Daily (1500mg ) 7)  Vytorin 10-80 Mg  Tabs (Ezetimibe-Simvastatin) .... Take 1 Tablet By Mouth Once A Day 8)  Singulair 10 Mg  Tabs (Montelukast Sodium) .... Take 1 Tablet By Mouth Once A Day 9)  Plavix 75 Mg  Tabs (Clopidogrel Bisulfate) .... Use As Directed 10)  Mobic 15 Mg  Tabs (Meloxicam) .... Take 1 Tablet By Mouth Once A Day 11)  Xanax 0.5 Mg  Tabs (Alprazolam) .... As Needed 12)  Meclizine Hcl 25 Mg  Tabs (Meclizine Hcl) .... As Needed 13)  Nitroquick 0.4 Mg  Subl (Nitroglycerin) .[name].. 1 Under Tongue Every 5 Min X3 14)  Ventolin Hfa 108 (90 Base) Mcg/act Aers (Albuterol Sulfate) .... 2 Puffs Every 4 Hours As Needed 15)  Zegerid 40-1100 Mg  Caps (Omeprazole-Sodium Bicarbonate) .[name].. 1 Capsule By Mouth Once Daily 16)  Astelin 137 Mcg/spray Soln (Azelastine Hcl) .... 2 Sprays Each Nostril Two Times A Day 17)  Symbicort 160-4.5 Mcg/act Aero (Budesonide-Formoterol Fumarate) .Marland KitchenMarland KitchenMarland Kitchen  2 Puffs and Rinse Well Twice Daily Instead of Advair 18)  Benzonatate 200 Mg Caps (Benzonatate) .[name].. 1 By Mouth Three Times A Day As Needed Cough 19)  Allergy Vaccine 1:2 20)  Mucinex 600 Mg Xr12h-Tab (Guaifenesin) .... 2 Every 12 Hours As Needed 21)  Nexium 40 Mg Cpdr (Esomeprazole Magnesium) .[name].. 1 Once Daily 22)  Xyzal 5 Mg Tabs (Levocetirizine Dihydrochloride) .[name].. 1 By Mouth Daily As Needed 23)  Pristiq 50 Mg Xr24h-Tab  (Desvenlafaxine Succinate) .... Take 1 By Mouth Once Daily 24)  Cetirizine Hcl 10 Mg Tabs (Cetirizine Hcl) .[name].. 1 Each Evening At Bedtime 25)  Promethazine Hcl 12.5 Mg Tabs (Promethazine Hcl) .... Take 1 By Mouth Four Times A Day As Needed Nausea  Allergies (verified): No Known Drug Allergies  Past History:  Past Medical History: Last updated: 03/24/2008 CAD- Dr. Donnie Aho Myocardial Infarction ADHD Allergic Rhinitis- Skin Test 02/23/08 vaccine 2009 Asthma- Mecolyl Neg 2005 [facility]  Past Surgical History: Last updated: 09/09/2007 Lumbar disc sx Cardiac cath Stent angioplasty  Family History: Last updated: 07/03/2010 Mother- died diabetes, dementia Father - died heart disease Daughtrer- Chronic fatigue   Social History: Last updated: 03/19/2010 Patient never smoked.  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   Risk Factors: Smoking Status: never (Jul 03, 2010)  Family History: Mother- died diabetes, dementia Father - died heart disease Daughtrer- Chronic fatigue   Review of Systems      See HPI  The patient denies shortness of breath with activity, shortness of breath at rest, productive cough, non-productive cough, coughing up blood, chest pain, irregular heartbeats, acid heartburn, indigestion, loss of appetite, weight change, abdominal pain, difficulty swallowing, sore throat, tooth/dental problems, headaches, nasal congestion/difficulty breathing through nose, and sneezing.    Vital Signs:  Patient profile:   66 year old male Height:      70 inches Weight:      161 pounds BMI:     23.18 O2 Sat:      97 % on Room air Pulse rate:   95 / minute BP sitting:   116 / 76  (left arm) Cuff size:   regular  Vitals Entered By: Reynaldo Minium CMA (07-03-2010 9:18 AM)  O2 Flow:  Room air CC: 3 month follow up visit-allergies.   Physical Exam  Additional Exam:  General: A/Ox3; pleasant  and cooperative, NAD, looks relaxed. SKIN: no rash, lesions NODES: no lymphadenopathy HEENT: [place]/AT, EOM- WNL, Conjuctivae- clear, PERRLA, TM-WNL, Nose- clear, Throat-  not red, Mallampati II NECK: Supple w/ fair ROM, JVD- none, normal carotid impulses w/o bruits. Thyroid-  CHEST:  Clear to P&A, HEART: RRR, no m/g/r heard ABDOMEN: Soft and nl EXB:MWUX, nl pulses, no edema  NEURO: Grossly intact to observation, no nystagmus      Impression & Recommendations:  Problem # 1:  ASTHMA (ICD-493.90) He has always mixed symptoms of reflux and  airway irritability. I think he is doing well now with asthma control. Discussed ways to reduce meds. he can try off Singulair and he can try reducing Symbicort to 1 puff two times a day.   Problem # 2:  ALLERGIC RHINITIS (ICD-477.9)  Continues allergy vaccine. We will reassess appropriateness as the seasonal pollens rise starting next month.  His updated medication list for this problem includes:    Astelin 137 Mcg/spray Soln (Azelastine hcl) .[name]... 2 sprays each nostril two times a day    Xyzal  5 Mg Tabs (Levocetirizine dihydrochloride) .[name]... 1 by mouth daily as needed    Cetirizine Hcl 10 Mg Tabs (Cetirizine hcl) .[name]... 1 each evening at bedtime    Promethazine Hcl 12.5 Mg Tabs (Promethazine hcl) .[name]... Take 1 by mouth four times a day as needed nausea  Medications Added to Medication List This Visit: 1)  Promethazine Hcl 12.5 Mg Tabs (Promethazine hcl) .... Take 1 by mouth four times a day as needed nausea  Other Orders: Est. Patient Level III (16109)  Patient Instructions: 1)  Please schedule a follow-up appointment in 6 months. 2)  You can try off and on Singulair a week at a time as you decide in different seasons, whether you need it or not.  3)  Try reducing your Symbicort to 1 puff, twice daily. See if that will hold you over time. If wheezing comes back, then start back to 2 puffs twice daily.

## 2010-07-17 DIAGNOSIS — J301 Allergic rhinitis due to pollen: Secondary | ICD-10-CM

## 2010-07-25 ENCOUNTER — Ambulatory Visit (INDEPENDENT_AMBULATORY_CARE_PROVIDER_SITE_OTHER): Payer: 59

## 2010-07-25 DIAGNOSIS — J301 Allergic rhinitis due to pollen: Secondary | ICD-10-CM

## 2010-08-01 ENCOUNTER — Ambulatory Visit (INDEPENDENT_AMBULATORY_CARE_PROVIDER_SITE_OTHER): Payer: 59

## 2010-08-01 DIAGNOSIS — J301 Allergic rhinitis due to pollen: Secondary | ICD-10-CM

## 2010-08-08 ENCOUNTER — Encounter: Payer: Self-pay | Admitting: Internal Medicine

## 2010-08-08 ENCOUNTER — Ambulatory Visit (INDEPENDENT_AMBULATORY_CARE_PROVIDER_SITE_OTHER): Payer: 59

## 2010-08-08 DIAGNOSIS — J301 Allergic rhinitis due to pollen: Secondary | ICD-10-CM | POA: Insufficient documentation

## 2010-08-08 HISTORY — DX: Allergic rhinitis due to pollen: J30.1

## 2010-08-14 ENCOUNTER — Ambulatory Visit (INDEPENDENT_AMBULATORY_CARE_PROVIDER_SITE_OTHER): Payer: 59

## 2010-08-15 ENCOUNTER — Encounter: Payer: Self-pay | Admitting: Internal Medicine

## 2010-08-15 DIAGNOSIS — J301 Allergic rhinitis due to pollen: Secondary | ICD-10-CM

## 2010-08-16 NOTE — Assessment & Plan Note (Signed)
Summary: ALLERGY/CB   Nurse Visit   Allergies: No Known Drug Allergies  Orders Added: 1)  Allergy Injection (1) [95115] 

## 2010-08-21 NOTE — Assessment & Plan Note (Signed)
Summary: allergy/cb  Nurse Visit   Allergies: No Known Drug Allergies  Orders Added: 1)  Allergy Injection (1) [95115] 

## 2010-08-23 ENCOUNTER — Ambulatory Visit (INDEPENDENT_AMBULATORY_CARE_PROVIDER_SITE_OTHER): Payer: 59

## 2010-08-23 LAB — POCT CARDIAC MARKERS

## 2010-08-23 LAB — POCT I-STAT, CHEM 8
BUN: 16 mg/dL (ref 6–23)
Chloride: 107 mEq/L (ref 96–112)
Creatinine, Ser: 0.9 mg/dL (ref 0.4–1.5)
Hemoglobin: 13.6 g/dL (ref 13.0–17.0)
TCO2: 24 mmol/L (ref 0–100)

## 2010-08-24 ENCOUNTER — Encounter: Payer: Self-pay | Admitting: Internal Medicine

## 2010-08-24 DIAGNOSIS — J301 Allergic rhinitis due to pollen: Secondary | ICD-10-CM

## 2010-08-28 NOTE — Assessment & Plan Note (Signed)
Summary: allergy/cb  Nurse Visit   Allergies: No Known Drug Allergies  Orders Added: 1)  Allergy Injection (1) [95115] 

## 2010-08-29 ENCOUNTER — Ambulatory Visit (INDEPENDENT_AMBULATORY_CARE_PROVIDER_SITE_OTHER): Payer: 59

## 2010-08-29 DIAGNOSIS — J301 Allergic rhinitis due to pollen: Secondary | ICD-10-CM

## 2010-09-07 ENCOUNTER — Ambulatory Visit (INDEPENDENT_AMBULATORY_CARE_PROVIDER_SITE_OTHER): Payer: 59

## 2010-09-07 DIAGNOSIS — J301 Allergic rhinitis due to pollen: Secondary | ICD-10-CM

## 2010-09-12 ENCOUNTER — Ambulatory Visit (INDEPENDENT_AMBULATORY_CARE_PROVIDER_SITE_OTHER): Payer: 59

## 2010-09-12 DIAGNOSIS — J301 Allergic rhinitis due to pollen: Secondary | ICD-10-CM

## 2010-09-12 LAB — POCT CARDIAC MARKERS
Myoglobin, poc: 98.8 ng/mL (ref 12–200)
Troponin i, poc: <0.05 ng/mL (ref 0.00–0.09)

## 2010-09-19 ENCOUNTER — Ambulatory Visit (INDEPENDENT_AMBULATORY_CARE_PROVIDER_SITE_OTHER): Payer: 59

## 2010-09-19 DIAGNOSIS — J309 Allergic rhinitis, unspecified: Secondary | ICD-10-CM

## 2010-09-25 ENCOUNTER — Ambulatory Visit (INDEPENDENT_AMBULATORY_CARE_PROVIDER_SITE_OTHER): Payer: 59

## 2010-09-25 DIAGNOSIS — J309 Allergic rhinitis, unspecified: Secondary | ICD-10-CM

## 2010-10-02 ENCOUNTER — Ambulatory Visit (INDEPENDENT_AMBULATORY_CARE_PROVIDER_SITE_OTHER): Payer: 59

## 2010-10-02 DIAGNOSIS — J309 Allergic rhinitis, unspecified: Secondary | ICD-10-CM

## 2010-10-11 ENCOUNTER — Ambulatory Visit (INDEPENDENT_AMBULATORY_CARE_PROVIDER_SITE_OTHER): Payer: 59

## 2010-10-11 DIAGNOSIS — J309 Allergic rhinitis, unspecified: Secondary | ICD-10-CM

## 2010-10-18 ENCOUNTER — Ambulatory Visit (INDEPENDENT_AMBULATORY_CARE_PROVIDER_SITE_OTHER): Payer: 59

## 2010-10-18 DIAGNOSIS — J309 Allergic rhinitis, unspecified: Secondary | ICD-10-CM

## 2010-10-19 ENCOUNTER — Ambulatory Visit (INDEPENDENT_AMBULATORY_CARE_PROVIDER_SITE_OTHER): Payer: 59

## 2010-10-19 DIAGNOSIS — J309 Allergic rhinitis, unspecified: Secondary | ICD-10-CM

## 2010-10-23 NOTE — Assessment & Plan Note (Signed)
[facility]                             PULMONARY OFFICE NOTE   BURKE, TERRY                  MRN:          [id number]  DATE:05/11/2007                            DOB:          Jan 20, 1946    PROBLEMS:  1. Allergic rhinitis.  2. Asthmatic bronchitis.  3. Attention deficit hyperactivity disorder.  4. Coronary disease/myocardial infarction (Dr. Donnie Aho).  5. Esophageal reflux.   HISTORY:  He had lumbar disc surgery, but breathing tolerated that okay.  Frequent light headedness, which he calls vertigo, and says runs in the  family. It is sometimes associated with sinus pressure discomfort and  nausea. Treated with meclizine. He is feeling some sinus pressure now.  Ears okay.   MEDICATIONS:  His list is charted and reviewed. Including:  1. Reglan 10 mg AC and nightly.  2. Wellbutrin 300 mg.  3. Paroxetine 20 mg x1/2.  4. Concerta 36 mg p.r.n.  5. Allegra 180 mg p.r.n.  6. Protonix 40 mg b.i.d.  7. Cardizem 240 mg.  8. Aspirin 81 mg.  9. Niaspan 1500 mg x1 1/2.  10.Vytorin 10/80.  11.Singulair 10 mg.  12.Plavix 75 mg.  13.Mobic 15 mg.  14.Ambi 60/580/30 p.r.n.  15.Xanax 0.5 mg p.r.n.  16.Meclizine 25 mg p.r.n.  17.NitroQuick 0.4 mg.  18.Folic acid.  19.Fish oil.  20.Linseed oil.  21.Potassium.  22.Advair discus 100/50.   ALLERGIES:  No medication allergy.   OBJECTIVE:  Weight 169 pounds, blood pressure 136/84, pulse 90, room air  saturation 98%. There is turbinate edema. Posterior pharynx is  cobblestoned without visible drainage. Voice quality is normal. There is  no adenopathy. Chest is clear.   IMPRESSION:  Rhinitis, pharyngitis, vertigo seems to be an intermittent  long standing issue treated elsewhere. Esophageal reflux may contribute  to throat irritation as discussed.   PLAN:  Saline nasal lavage with instructions. Nasacort AQ sample twice  each nostril daily. Schedule return 4 months, earlier p.r.n.     Clinton D. Maple Hudson, MD, Tonny Bollman, FACP  Electronically Signed    CDY/MedQ  DD: 05/17/2007  DT: 05/18/2007  Job #: [id number]   cc:   Sharlot Gowda, M.D.

## 2010-10-23 NOTE — Assessment & Plan Note (Signed)
[facility]                             PULMONARY OFFICE NOTE   [name], [name]                  MRN:          [id number]  DATE:02/10/2007                            DOB:          Dec 04, 1945    PROBLEM:  1. Allergic rhinitis.  2. Asthmatic bronchitis.  3. Attention-deficit hyperactivity disorder.  4. Coronary disease/myocardial infarction.  5. Esophageal reflux.   HISTORY:  He comes today complaining of 2 weeks of recurrent substernal  discomfort/chest tightness.  This is mostly associated with hurriedly  getting dressed and similar brisk activities of daily living.  He feels  as if his chest gets tight, does not recognize radiation.  Symptoms  usually slowly resolve with rest.  He has had some postnasal throat  congestion, feeling phlegm hangs in his throat, which he finds  embarrassing, nothing purulent or bloody.  Advair had helped some in the  past for chest tightness and he wants to try it again.  He had lumbar  surgery this spring.   MEDICATIONS:  Mobic, Wellbutrin, Vytorin, Concerta, Paxil, Singulair,  Niaspan, Protonix, Plavix, aspirin, Cardizem, Testim, p.r.n. use of  Allegra and Ambien.   ALLERGIES:  No medication allergy.   OBJECTIVE:  Weight 185 pounds.  BP 120/82, pulse 95, room air saturation  98%.  Relaxed and calm.  HEENT:  Voice quality normal.  Pharynx is quite red on Protonix b.i.d.  with no stridor, no visible drainage.  There is mild nasal stuffiness.  No bruits or neck vein distention.  LUNGS:  Clear.  HEART:  Sounds regular without murmur or gallop.   IMPRESSION:  1. Pharyngitis, looking very consistent with significant acid reflux.  2. Allergic rhinitis.  3. Esophageal reflux.  4. Dyspnea with substernal chest tightness, noting that he wants      Advair.  I do not think this is angina, but I discussed the issue      with him and we are going to watch closely.   PLAN:  1. Try changing Protonix to  Zegerid 40 mg b.i.d. with samples.  2. Advair 100/50 one b.i.d.  3. Xopenex HFA rescue inhaler two puffs q.i.d. p.r.n.  4. Schedule return in 3 months, earlier p.r.n.  If his anterior chest      wall discomfort persists, I want him to see Dr. Donnie Aho.     Clinton D. Maple Hudson, MD, Tonny Bollman, FACP  Electronically Signed    CDY/MedQ  DD: 02/10/2007  DT: 02/11/2007  Job #: [id number]   cc:   Sharlot Gowda, M.D.  Georga Hacking, M.D.

## 2010-10-23 NOTE — Assessment & Plan Note (Signed)
[facility]                                 ON-CALL NOTE   [name], [name]                  MRN:          [id number]  DATE:02/14/2008                            DOB:          [birth year]    Medical Record Number:  [id number].   The caller is [name].   The patient states he is a patient of Dr. Maple Hudson.  He has a history of  asthma and allergies.   The patient states he was seen by the nurse practitioner on January 22, 2008, and was a given a breathing treatment in clinic and a prescription  for Z-Pak.  At that time, he had symptoms of chest and nasal congestion.  As his symptoms did not improve with the azithromycin, he saw Dr. Maple Hudson  a couple of weeks later.  During that visit, he was prescribed NasalCrom  and prednisone (Medrol Dosepak) and advised to take Mucinex.  Additionally, he was given doxycycline.  The patient has not felt  improvement with prednisone and doxycycline therapy.  He continues to  have sinus drainage, which is worse when he lies supine.  He has been  afebrile.  The patient has not been using his neti pot recently.  He is  taking his ProAir 4 times per day.   PLAN:  Advise the patient to use his neti pot twice daily.   Called in Astelin nasal spray 2 puffs b.i.d. to [facility] (phone number  [phone]).   I had previously called in the prescription to [facility] (phone number:  [phone]), however, the patient called back as this [facility] was  closed.  His prescription was successfully phoned in at 19H25 this  evening.   The patient will call the office should his symptoms continue to worsen  and certainly will seek emergency care should his symptoms worsen.  He  does have an appointment for allergy shots on February 23, 2008.     Dwayne Robin Searing, MD  Electronically Signed    STL/MedQ  DD: 02/14/2008  DT: 02/15/2008  Job #: [id number]   cc:   [name] D. Maple Hudson, MD, FCCP, FACP

## 2010-10-23 NOTE — Procedures (Signed)
CAROTID DUPLEX EXAM   INDICATION:  Carotid artery disease.   HISTORY:  Diabetes:  No  Cardiac:  Yes, MI in 1995, stent  Hypertension:  Yes  Smoking:  No  Previous Surgery:  No  CV History:  No  Amaurosis Fugax No, Paresthesias No, Hemiparesis No                                       RIGHT             LEFT  Brachial systolic pressure:         122               120  Brachial Doppler waveforms:         Triphasic         Triphasic  Vertebral direction of flow:        Antegrade         Antegrade  DUPLEX VELOCITIES (cm/sec)  CCA peak systolic                   111               101  ECA peak systolic                   127               91  ICA peak systolic                   72                63  ICA end diastolic                   21                25  PLAQUE MORPHOLOGY:                  Calcified         Calcified  PLAQUE AMOUNT:                      Very minimal      Very minimal  PLAQUE LOCATION:                    Bifurcation       Proximal ICA   IMPRESSION:  1. The right bifurcation shows evidence of 1-19% stenosis.  2. The left proximal internal carotid artery shows evidence of 1-19%      stenosis.   ___________________________________________  Janetta Hora Fields, MD   AS/MEDQ  D:  01/06/2007  T:  01/07/2007  Job:  [id number]

## 2010-10-23 NOTE — Op Note (Signed)
NAME:  [name], [name]           ACCOUNT NO.:  [id number]   MEDICAL RECORD NO.:  [id number]          PATIENT TYPE:  AMB   LOCATION:  DAY                          FACILITY:  [facility]   PHYSICIAN:  Jene Every, M.D.    DATE OF BIRTH:  Oct 03, 1945   DATE OF PROCEDURE:  04/16/2007  DATE OF DISCHARGE:                               OPERATIVE REPORT   PREOPERATIVE DIAGNOSIS:  Recurrent herniated nucleus pulposus, spinal  stenosis, L4-5, left.   POSTOPERATIVE DIAGNOSIS:  Recurrent herniated nucleus pulposus, spinal  stenosis, L4-5, left.   PROCEDURE:  Redo decompression, L4-5, lateral recess decompression and  foraminotomy of L5.   ANESTHESIA:  General.   SURGEON:  Jene Every, M.D.   ASSISTANT:  Georges Lynch. Gioffre, M.D.   BRIEF HISTORY AND INDICATIONS:  66 year old status post history of  lumbar decompression, 4-5, did well.  Had recent recurrence of leg pain.  MRI indicating recurrent disk herniation, indicated for decompression.  Positive neural tension signs, EHL weakness.  Risks and benefits  discussed, including bleeding, infection, __damage to neurovascular  structures________ , CSF leakage, epidural fibrosis, __etc________  ,  need for fusion in the future, anesthetic complications, etc.   The patient placed in supine position.  After induction of adequate  anesthesia and 1 gram of Kefzol, he was placed prone on the [place]  frame.  All bony prominences well-padded.  Lumbar region prepped and  draped in the usual sterile fashion.   Previous surgical incision was utilized.  Subcutaneous tissue was  dissected.  Electrocautery used to achieve hemostasis.  Dorsolumbar  fascia identified and divided in line of the skin incisions.  Paraspinous muscle elevated from lamina of 4 and 5.  Operating  microscope draped and brought onto the surgical field.  A curette was  utilized to skeletonize the lamina of 4 and of 5.  We enlarged  hemilaminotomies of 4 and 5 with a 2-mm Kerrison  and a foraminotomy of  5,  identifying the 5 root and removing epidural fibrosis laterally,  detaching it from the previous laminotomy site.  The thecal sac and  nerve root was gently mobilized medially.  The disk space was noted,  entered.  Paracentral disk herniation was noted.  This was subannular.  Performed annulotomy.  Copious portion of disk material was removed from  the disk space with straight and upbiting pituitary.  The free fragment  was mobilized from the subannular space with an Epstein and retrieved  from the disk space.  Following this, there was good mobilization of the  thecal sac and __the nerve root at least a________  centimeter medial to  the pedicle.  Hockey stick probe passed freely up the foramen of 4 and  5.  There was no evidence of CSF leakage or active bleeding.  Disk space  copiously irrigated with antibiotic irrigation.  Inspection revealed no  evidence of CSF leakage or active bleeding.  Again, the [place]field 4,  Kerrison, and the hockey stick passed freely up the foramen of 4 and of  5.  Thrombin-soaked Gelfoam was placed in the laminotomy wound.  McCullough retractors removed.  Paraspinous muscle inspected  with no  evidence of active bleeding.  Dorsolumbar fascia reapproximated with 0  Vicryl interrupted figure-of-eight suture.  Subcutaneous tissue  reapproximated with 2-0 Vicryl simple sutures.  The skin was  reapproximated with staples.  The wound was dressed sterilely.   He was awoken without difficulty and transported to the recovery room in  satisfactory condition.  The patient tolerated the procedure well.  There were no complications.  Minimal blood loss.      Jene Every, M.D.  Electronically Signed     JB/MEDQ  D:  04/16/2007  T:  04/17/2007  Job:  [id number]

## 2010-10-26 ENCOUNTER — Ambulatory Visit (INDEPENDENT_AMBULATORY_CARE_PROVIDER_SITE_OTHER): Payer: 59

## 2010-10-26 DIAGNOSIS — J309 Allergic rhinitis, unspecified: Secondary | ICD-10-CM

## 2010-10-26 NOTE — Cardiovascular Report (Signed)
NAME:  [name], STOGSDILL NO.:  [id number]   MEDICAL RECORD NO.:  [id number]          PATIENT TYPE:  OIB   LOCATION:  2852                         FACILITY:  [facility]   PHYSICIAN:  Georga Hacking, M.D.DATE OF BIRTH:  07-19-1945   DATE OF PROCEDURE:  02/07/2005  DATE OF DISCHARGE:                              CARDIAC CATHETERIZATION   HISTORY:  A 65 year old male with a previous history of coronary artery  disease and previous stenting.  He had some atypical chest pain recently and  had a Cardiolite study showing a possible small area of reversible ischemia  in the inferior wall, also at the setting of a previous infarct.   PROCEDURE:  Left heart catheterization with coronary angiograms and left  ventriculogram,.   COMMENTS ABOUT PROCEDURE:  The patient tolerated the procedure well without  complications.  Following the procedure, he had good hemostasis and  peripheral pulses noted.   HEMODYNAMIC DATA:  Aorta post contrast 102/68, LV post contrast 102/10-14.   ANGIOGRAPHIC DATA:  Left ventriculogram:  Performed in the 30-degree RAO  projection.  The aortic valve is normal.  The mitral valve is normal.  There  was very minimal inferior wall hypokinesis noted.  The estimated ejection  fraction was 55-60%.   Coronary arteries:  Arise and distribute normally.  There is calcification  noted in the proximal left coronary system as well as the right coronary  system.  The left main coronary is normal.  The left anterior descending has  a 60-70% proximal stenosis prior to the diagonal branch and the septal  perforator.  There is area of calcification with moderate diffuse narrowing  in this proximal portion, and there is also a 60% midvessel stenosis that  involves the origin of a diagonal branch.  The diagonal branch is moderate  sized and has an angulated takeoff and a severe 90% ostial narrowing.  There  is moderate irregularity in the distal vessel.  The circumflex  coronary  artery has a large intermediate branch.  One of a distal bifurcating  branches has a 40-50% narrowing.  The continuation branch of the circumflex  is a somewhat small vessel and the ends in a posterolateral branch.  The  right coronary artery:  A dominant vessel supplying a posterior descending  and two posterolateral branches.  There is a segmental 50-60% proximal  narrowing prior to a previously-placed stent.  The stented segment is widely  patent without evidence of significant residual stenosis.  There is mild  narrowing of the artery on the downstream side of the stented segment.   IMPRESSION:  1.  Two-vessel coronary disease, predominantly involving the left anterior      descending artery and diagonal branch as well as the right coronary      artery, with minimal disease involving the intermediate branch.  2.  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   RECOMMENDATIONS:  The films were compared to previous films from 1999, and  there has been essentially no change in the angiographic progression of  disease.  We will continue intensive  medical therapy at this time.      Georga Hacking, M.D.  Electronically Signed     WST/MEDQ  D:  02/07/2005  T:  02/07/2005  Job:  [id number]   cc:   Sharlot Gowda, M.D.  Fax: [phone]

## 2010-10-26 NOTE — Assessment & Plan Note (Signed)
[facility]                               PULMONARY OFFICE NOTE   QUILL, GRINDER                  MRN:          [id number]  DATE:01/27/2006                            DOB:          [birth year]    PROBLEM LIST:  1. Allergic rhinitis.  2. Asthmatic bronchitis.  3. Attention deficit/hyperactivity.  4. Coronary artery disease/myocardial infarction.  5. Esophageal reflux.   HISTORY OF PRESENT ILLNESS:  Occasionally feels short of breath while supine  and last night, he says that his breathing was raspy as he lay in bed but  she has not had to prop up on extra pillows. Dr. Donnie Aho increased his  Protonix to b.i.d. and that has helped the way his throat feels. He says he  is more aware now of a roll of reflux in his upper airway symptoms. He is  having no trouble singing in choir and holding his notes but he feels glue  sometimes in the back on his throat. He continues Allegra, Singulair, and a  mucous thinner p.r.n.   MEDICATIONS:  1. Reglan 10 mg a.c. and at bedtime.  2. Wellbutrin 300 mg.  3. Paroxetine 20 mg x1/2.  4. Concerta 20 mg p.r.n.  5. Fexofenadine 180 mg, sometimes used b.i.d.  6. Protonix 40 mg b.i.d.  7. Cardizem 240 mg.  8. Aspirin 81 mg.  9. Niaspan 1500 mg.  10.Vytorin 10/80.  11.Singulair 10 mg.  12.Plavix 75 mg.  13.Mobic 15 mg.  14.Ambi 60/580/30 p.r.n.  15.Alprazolam 0.5 mg.  16.Meclizine 25 mg.  17.Nitroglycerin 0.4.  18.Folic acid, fish oil, Linseed oil, potassium.   ALLERGIES:  NO KNOWN DRUG ALLERGIES.   PHYSICAL EXAMINATION:  VITAL SIGNS:  Weight 185 pounds. Blood pressure  122/82, pulse 77 and regular. Room air saturation 98%.  HEENT:  His throat looks quite normal. Voice quality is normal. I find no  adenopathy. No evidence of nasal inflammation or post nasal drainage. No  cough or wheeze.  LUNGS:  Clear to auscultation and percussion.  HEART:  Sounds regular without murmur.   IMPRESSION:  Currently  stable control of allergic rhinitis and reflux.   PLAN:  Continue present treatment, emphasizing reflux precautions. Did  schedule return in 6 months, earlier p.r.n.                                   Clinton D. Maple Hudson, MD, [facility] FACP   CDY/MedQ  DD:  02/01/2006  DT:  02/02/2006  Job #:  [id number]   cc:   Sharlot Gowda, MD  Georga Hacking, MD

## 2010-10-26 NOTE — Op Note (Signed)
NAME:  [name], [name]           ACCOUNT NO.:  [id number]   MEDICAL RECORD NO.:  [id number]          PATIENT TYPE:  AMB   LOCATION:  DAY                          FACILITY:  [facility]   PHYSICIAN:  Jene Every, M.D.    DATE OF BIRTH:  1946-03-19   DATE OF PROCEDURE:  09/24/2006  DATE OF DISCHARGE:                               OPERATIVE REPORT   PREOPERATIVE DIAGNOSIS:  Spinal stenosis, herniated nucleus pulposus L4-  5 left.   POSTOPERATIVE DIAGNOSIS:  Spinal stenosis, herniated nucleus pulposus L4-  5 left.   PROCEDURE PERFORMED:  Lateral recess decompression, microdiskectomy L4-  5, foraminotomy, lysis of adhesions.   ANESTHESIA:  General.   ASSISTANT:  Roma Schanz, P.A.   BRIEF HISTORY AND INDICATIONS:  This is a 65 year old with left lower  extremity radiculopathy, positive __________  signs, EHL weakness, HNP  on MRI  4-5 and lateral recess stenosis refractory to conserve treatment  including epidural steroid injection, activity modification,  indicated  for decompression of 5 root.  Risks and benefits discussed including  bleeding,  infection, damage to neurovascular structures, CSF leakage,  epidural fibrosis, adjacent segment disease, need for fusion in the  future, anesthetic complications etc. Had some minor right-sided  symptoms preoperatively.  We discussed this.  He had negative straight  leg raise, motor is 5/5 EHL.  Given the minor symptomatology, we did not  elect to decompress the right side as well.  He was in agreement with  that.   TECHNIQUE:  The patient in supine position after adequate general  anesthesia, 2 grams Kefzol was placed on the [place] frame.  All bony  points well padded.  Lumbar region prepped in the usual sterile fashion.  18 gauge spinal needles utilized to localize the 4-5 interspace  confirmed with x-ray.  Incision was made from spinous process 4 to 5,  subcutaneous tissue was dissected.  Electrocautery utilized to achieve  hemostasis.  Dorsolumbar fascia identified divided line skin incision.  Paraspinous muscle elevated from lamina of 4 and 5. McCullough retractor  was placed.  Operating microscope draped and brought into the surgical  field after an x-ray confirmed the interlaminar space at 4-5.   A hemilaminotomy at the caudad edge of 4 was performed with 3 and 2 mm  Kerrison detaching ligamentum flavum from the cephalad edge.  Foraminotomy of L5 was performed.  Significant lateral recess stenosis  was noted.  This was multifactorial and this was decompressed with 2 mm  Kerrison to the medial border of the pedicle.  There was also vascular  leash holding the L5 nerve root.  This was lysed with a nerve hook.  Bipolar electrocautery utilized to achieve hemostasis.  Focal HNP was  noted, with a neural elements well protected, performed annulotomy,  copious portion of disk material was removed from disk space with  straight and upbiting pituitary further mobilized with an Epstein and  with multiple passes following that.  Beneath thecal sac.  There was  osteophytic ridge across the disk space but no further herniated  material beneath the thecal sac.  The foramen of 4 and 5 there was good  excursion of 5 root at least a centimeter of the medial pedicle now  without difficulty.  It was erythematous and edematous but no CSF leaks  or active bleeding.  The space copiously irrigated with antibiotic  irrigation.  Thrombin-soaked Gelfoam was placed in the laminotomy  defect.  McCullough retractor removed, paraspinous muscle inspected, no  evidence of active bleeding.  Dorsolumbar fascia reapproximated 0-0  Vicryl simple sutures.  Skin was reapproximated with 2-0 Vicryl simple  sutures.  The skin was reapproximated with 4-0 subcuticular Prolene.  Wound reinforced Steri-Strips.  Sterile dressing applied, he was placed  supine on the hospital bed, extubated without difficulty transported to  recovery in satisfactory  condition.   The patient tolerated the procedure well, no complications.      Jene Every, M.D.  Electronically Signed     JB/MEDQ  D:  09/24/2006  T:  09/24/2006  Job:  [id number]

## 2010-10-26 NOTE — Procedures (Signed)
[facility]  Patient:    [name], [name]                    MRN: [id number] Proc. Date: 01/09/00 Adm. Date:  [id number] Disc. Date: [id number] Attending:  Rich Brave CC:         Hadassah Pais. Jeannetta Nap, M.D.   Procedure Report  PROCEDURE PERFORMED:  Colonoscopy with biopsy.  ENDOSCOPIST:  Florencia Reasons, M.D.  INDICATIONS:  A 65 year old for screening and also with change in bowel habits characterized by postprandial urgency of defecation.  FINDINGS:  Essentially normal exam to the cecum except for a right-sided diverticulum.  DESCRIPTION OF PROCEDURE:  The nature, purpose and risk of the procedure had been discussed with the patient who provided written consent.  Sedation was fentanyl 75 mcg and Versed 7 mg IV without arrhythmias or desaturation.  The Olympus adult video colonoscope was easily advanced to the cecum and the site identified by visualization of the appendiceal orifice and pull back was then performed in a gradual fashion.  The quality of the prep was excellent and it is felt that all areas were well seen.  There was a rare right-sided diverticulum, but this was otherwise a normal exam without evidence of any loss of vascularity or other signs of colitis, nor any evidence of polyps, cancer or vascular malformations.  Retroflexion of the rectum was normal and random mucosal biopsies along the length of the colon were unremarkable.  The patient tolerated the procedure well.  There were no apparent complications.  IMPRESSION:  Essentially normal colonoscopy.  PLAN:  Await pathology and biopsies to see if there are any clues as to the reason for the patients altered bowel habit, which is most likely due to irritable bowel syndrome. DD:  01/09/00 TD:  01/10/00 Job: [id number]

## 2010-10-26 NOTE — Procedures (Signed)
NAME:  [name], [name] NO.:  [id number]   MEDICAL RECORD NO.:  [id number]          PATIENT TYPE:  OUT   LOCATION:  SLEEP CENTER                 FACILITY:  [facility]   PHYSICIAN:  Clinton D. Maple Hudson, M.D. DATE OF BIRTH:  [birth year]   DATE OF STUDY:  04/15/2004                              NOCTURNAL POLYSOMNOGRAM   REFERRING PHYSICIAN:  Clinton D. Maple Hudson, M.D.   INDICATION FOR STUDY AND HISTORY:  Hypersomnia with sleep apnea.   Epworth sleepiness score 7/24, BMI 25, weight 175 pounds.   SLEEP ARCHITECTURE:  Total sleep time 356 minutes with sleep efficiency of  84%.  Stage I was 9%; stage II, 52%; Stages III and IV 14% which is  increased.  REM was 25% of total sleep time.  Latency to sleep onset 30  minutes, latency to REM 110 minutes.  Awake after sleep onset 40 minutes.  Arousal index 26.8.  The patient's home medications include several capable  of affecting sleep including Concerta, Paxil, Wellbutrin, and alprazolam.  He took Niaspan and Vitorin before bed.   RESPIRATORY DATA:  Split study protocol.  RDI 6.1 per hour indicating mild  obstructive sleep apnea/hypopnea syndrome before CPAP.  This included 1  obstructive apnea, 2 central apneas, and 18 hypopneas.  Events were not  positional.  REM RDI was 6.1 per hour.  CPAP was then titrated to 8 CWP, RDI  2 per hour.  A medium Respironics soft gel mask was used.   OXYGEN DATA:  Mild to moderate snoring with oxygen desaturation to a nadir  of 91% before CPAP.  After CPAP titration, mean saturation held 96 to 97% on  room air.   CARDIAC DATA:  Normal sinus rhythm.   MOVEMENT AND PARASOMNIA:  A total of 339 limb jerks were recorded of which  86 were associated with arousal or awakening for a periodic limb movement  with arousal index of 14.5 per hour which is increased.   IMPRESSION AND RECOMMENDATIONS:  1.  Mild obstructive sleep apnea/hypopnea syndrome, RDI 6.1 per hour with      desaturation to 91%.  2.  CPAP  titration to 8 CWP, RDI 2 per hour, using a medium Respironics Soft      Gel mask.  3.  Periodic limb movement syndrome with arousal, 14.5 per hour, which may      require separate therapy.   Clinton D. Maple Hudson, M.D.  Diplomate, Biomedical engineer of Sleep Medicine                                                           Clinton D. Maple Hudson, M.D.  Diplomate, [employer] Board   CDY/MEDQ  D:  04/22/2004 12:29:41  T:  04/22/2004 15:12:48  Job:  [id number]

## 2010-10-26 NOTE — Assessment & Plan Note (Signed)
[facility]                             PULMONARY OFFICE NOTE   ELY, BALLEN                  MRN:          [id number]  DATE:07/30/2006                            DOB:          1945/10/19    PROBLEMS:  1. Allergic rhinitis.  2. Asthmatic bronchitis.  3. Attention deficit/hyperactivity.  4. Coronary disease/myocardial infarction.  5. Esophageal reflux.   HISTORY:  We had gotten a contact from his insurance plan asking if he  should be on a steroid inhaler. He says Singulair works well for him and  he has not needed his rescue inhaler much less than anything else. He is  satisfied with his breathing status. He still gets a little bit of head  congestion and cough occasionally and says he took Meclizine today for  dizziness and mild head congestion, and he expects that to take care of  it. He is being treated with acupuncture for sciatica, but anticipates  he will need surgery. We briefly discussed issues of general anesthesia.  Sometimes he notes some difficulty swallowing while laying supine. He  had dropped off of Reglan and is going to start back with that using  what he has left over to see if that affects this symptom.   MEDICATIONS:  1. What he had was Reglan 10 mg AC and nightly.  2. Wellbutrin 300 mg.  3. Fluoxetine 20 mg x1/2.  4. Concerta 36 mg p.r.n.  5. Allegra 180 mg.  6. Protonix 40 mg b.i.d.  7. Cardizem 240 mg.  8. Aspirin 81 mg.  9. Niaspan 1500 mg.  10.Vytorin 10-80.  11.Singulair 10 mg.  12.Clopidogrel 75 mg.  13.Mobic 15 mg.  14.Ambi 60/580/30 p.r.n.  15.Xanax 0.5 mg p.r.n.  16.Meclizine 25 mg p.r.n.  17.Nitroglycerin 0.4. mg p.r.n.   OBJECTIVE:  Weight 176 pounds, blood pressure 114/78, pulse regular 86,  room air saturation 98%. His throat is just a little bit reddened with  no strider or visible drainage. He has braces. Lungs are clear with easy  unlabored breathing. Heart sounds are regular without  murmur. There is  no adenopathy.   IMPRESSION:  Allergic rhinitis and GERD.   PLAN:  Continue present therapy, schedule return 6 months, maintain  active anti-reflux measures as discussed.     Clinton D. Maple Hudson, MD, [facility], FACP  Electronically Signed    CDY/MedQ  DD: 07/30/2006  DT: 07/31/2006  Job #: [id number]   cc:   Sharlot Gowda, M.D.  Georga Hacking, M.D.

## 2010-10-31 ENCOUNTER — Ambulatory Visit (INDEPENDENT_AMBULATORY_CARE_PROVIDER_SITE_OTHER): Payer: 59

## 2010-10-31 DIAGNOSIS — J309 Allergic rhinitis, unspecified: Secondary | ICD-10-CM

## 2010-11-07 ENCOUNTER — Ambulatory Visit (INDEPENDENT_AMBULATORY_CARE_PROVIDER_SITE_OTHER): Payer: 59

## 2010-11-07 DIAGNOSIS — J309 Allergic rhinitis, unspecified: Secondary | ICD-10-CM

## 2010-11-15 ENCOUNTER — Ambulatory Visit (INDEPENDENT_AMBULATORY_CARE_PROVIDER_SITE_OTHER): Payer: 59

## 2010-11-15 DIAGNOSIS — J309 Allergic rhinitis, unspecified: Secondary | ICD-10-CM

## 2010-11-16 ENCOUNTER — Telehealth: Payer: Self-pay | Admitting: Internal Medicine

## 2010-11-16 MED ORDER — HYDROCODONE-HOMATROPINE 5-1.5 MG/5ML PO SYRP: 5.0000 mL | ORAL_SOLUTION | Freq: Four times a day (QID) | ORAL | Status: AC | PRN

## 2010-11-16 NOTE — Telephone Encounter (Signed)
Pt aware cough syrup has been called to his pharmacy and will call if he has any questions or problems.

## 2010-11-16 NOTE — Telephone Encounter (Signed)
Ok to refill Hyrdomet cough syrup #251ml 1 tsp every 6 hours prn no refills.

## 2010-11-16 NOTE — Telephone Encounter (Signed)
Pt seen at urgent care a few weeks back and was started of cefdinir and hydromet for sinus infection and cough. Pt says he has one more dose of cefdinir and is out of the cough syrup. He has a lingering dry cough and would like something to help with this. He is taking Mucinex DM twice daily also. [facility] uses [facility] [address]. [name] Pls advise.No Known Allergies

## 2010-11-22 ENCOUNTER — Ambulatory Visit (INDEPENDENT_AMBULATORY_CARE_PROVIDER_SITE_OTHER): Payer: 59

## 2010-11-22 DIAGNOSIS — J309 Allergic rhinitis, unspecified: Secondary | ICD-10-CM

## 2010-12-05 ENCOUNTER — Ambulatory Visit (INDEPENDENT_AMBULATORY_CARE_PROVIDER_SITE_OTHER): Payer: 59

## 2010-12-05 DIAGNOSIS — J309 Allergic rhinitis, unspecified: Secondary | ICD-10-CM

## 2010-12-10 ENCOUNTER — Encounter: Payer: Self-pay | Admitting: Internal Medicine

## 2010-12-14 ENCOUNTER — Ambulatory Visit (INDEPENDENT_AMBULATORY_CARE_PROVIDER_SITE_OTHER): Payer: 59

## 2010-12-14 DIAGNOSIS — J309 Allergic rhinitis, unspecified: Secondary | ICD-10-CM

## 2010-12-17 ENCOUNTER — Encounter: Payer: Self-pay | Admitting: Internal Medicine

## 2010-12-18 ENCOUNTER — Encounter: Payer: Self-pay | Admitting: Internal Medicine

## 2010-12-18 ENCOUNTER — Ambulatory Visit (INDEPENDENT_AMBULATORY_CARE_PROVIDER_SITE_OTHER): Payer: Medicare Other

## 2010-12-18 ENCOUNTER — Ambulatory Visit (INDEPENDENT_AMBULATORY_CARE_PROVIDER_SITE_OTHER): Payer: Medicare Other | Admitting: Internal Medicine

## 2010-12-18 VITALS — BP 122/78 | HR 92 | Ht 70.0 in | Wt 168.4 lb

## 2010-12-18 DIAGNOSIS — J309 Allergic rhinitis, unspecified: Secondary | ICD-10-CM

## 2010-12-18 DIAGNOSIS — J301 Allergic rhinitis due to pollen: Secondary | ICD-10-CM

## 2010-12-18 DIAGNOSIS — J45909 Unspecified asthma, uncomplicated: Secondary | ICD-10-CM

## 2010-12-18 MED ORDER — BENZONATATE 200 MG PO CAPS: 200.0000 mg | ORAL_CAPSULE | Freq: Three times a day (TID) | ORAL | Status: DC | PRN

## 2010-12-18 NOTE — Progress Notes (Signed)
  Subjective:    Patient ID: [name], male    DOB: 06/05/1946, 65 y.o.   MRN: [id number]  HPI 12/18/10- 40 yo M followed for asthma, allergic rhinitis complicated by hx ADHD, CAD/ MI, GERD Last here June 21, 2010  Continues allergy vaccine 1:2 GH. In last 2-3 weeks, if he rushes or gets tense, he coughs and gets chest pains, especially going in and out of heat. Describes the pains as an airway soreness with some chest muscle soreness with increased raspy cough. Wife notes his raspy throat clearing. Mostly chest and throat, with persistent, "controlled" nasal drainage. He does garden and maintain yard. Has trouble remembering Symbicort.    Review of Systems Constitutional:   No weight loss, night sweats,  Fevers, chills, fatigue, lassitude. HEENT:   No headaches,  Difficulty swallowing,  Tooth/dental problems,  Sore throat,                No sneezing, itching, ear ache,   CV:  No chest pain,  Orthopnea, PND, swelling in lower extremities, anasarca, dizziness, palpitations  GI  No heartburn, indigestion, abdominal pain, nausea, vomiting, diarrhea, change in bowel habits, loss of appetite  Resp:   No coughing up of blood.  No change in color of mucus.  No wheezing.    Skin: no rash or lesions.  GU: no dysuria, change in color of urine, no urgency or frequency.  No flank pain.  MS:  No joint pain or swelling.  No decreased range of motion.  No back pain.  Psych:  No change in mood or affect. No depression or anxiety.  No memory loss.      Objective:   Physical Exam General- Alert, Oriented, Affect-appropriate, Distress- none acute  Skin- rash-none, lesions- none, excoriation- none  Lymphadenopathy- none  Head- atraumatic  Eyes- Gross vision intact, PERRLA, conjunctivae clear secretions  Ears- Hearing, canals, Tm- normal  Nose- Clear, No-Septal dev, mucus, polyps, erosion, perforation   Throat- Mallampati II , mucosa clear , drainage- none, tonsils-  atrophic  Neck- flexible , trachea midline, no stridor , thyroid nl, carotid no bruit  Chest - symmetrical excursion , unlabored     Heart/CV- RRR , no murmur , no gallop  , no rub, nl s1 s2                     - JVD- none , edema- none, stasis changes- none, varices- none     Lung- clear to P&A, wheeze- none, cough- none , dullness-none, rub- none              vague upper tracheal coarseness to sounds.      Chest wall- - normal  Abd- tender-no, distended-no, bowel sounds-present, HSM- no  Br/ Gen/ Rectal- Not done, not indicated  Extrem- cyanosis- none, clubbing, none, atrophy- none, strength- nl  Neuro- grossly intact to observation         Assessment & Plan:

## 2010-12-18 NOTE — Patient Instructions (Signed)
Benzonatate refilled- sent  Try using you Symbicort very regularly - put it on your bathroom sink or take it before breakfast and supper, so it gets to be routine.

## 2010-12-18 NOTE — Assessment & Plan Note (Signed)
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 We discussed exposure and avoidance. He has not been using his symbicort as regularly as he should for best effect.

## 2010-12-21 NOTE — Assessment & Plan Note (Signed)
Doing well with no problems. We continue to find it helpful.

## 2010-12-24 ENCOUNTER — Ambulatory Visit (INDEPENDENT_AMBULATORY_CARE_PROVIDER_SITE_OTHER): Payer: Medicare Other | Admitting: Medical

## 2010-12-24 ENCOUNTER — Encounter: Payer: Self-pay | Admitting: Medical

## 2010-12-24 DIAGNOSIS — IMO0002 Reserved for concepts with insufficient information to code with codable children: Secondary | ICD-10-CM

## 2010-12-24 DIAGNOSIS — S39011A Strain of muscle, fascia and tendon of abdomen, initial encounter: Secondary | ICD-10-CM

## 2010-12-24 DIAGNOSIS — R109 Unspecified abdominal pain: Secondary | ICD-10-CM

## 2010-12-24 NOTE — Progress Notes (Signed)
Subjective:   HPI  [name] is a 65 y.o. male who presents for 2 wk hx/o right side pain.  He notes 2 weeks ago he was bending over in his chair and turned funny, felt a pop or pull, and since then has had pain in his right abdomen and chest.  Pain is worse with movement, particularly when lying down or sitting up, or movement of his torso in general. He has a history of chronic back pain, but no worsening of his back right now. Otherwise he has felt fine. He notes prior gallbladder surgery in 1999, colonoscopy within the past 5-7 years, and he just had an upper endoscopy this year. His gastroenterologist is Dr. Clent Ridges.  He has bad reflux, and takes Nexium in the morning and Zegerid at night.  No other aggravating or relieving factors.  No other c/o.  The following portions of the patient's history were reviewed and updated as appropriate: allergies, current medications, past family history, past medical history, past social history, past surgical history and problem list.  Past Medical History  Diagnosis Date  . CAD (coronary artery disease)     Dr. Donnie Aho  . Myocardial infarct   . Allergic rhinitis     skin test 02/23/08 vaccine 2009  . GERD (gastroesophageal reflux disease)   . Hyperlipidemia   . Arthritis   . Erectile dysfunction   . Depression   . ADD (attention deficit disorder)   . Hypogonadism male     Review of Systems Constitutional: denies fever, chills, sweats, unexpected weight change, anorexia, fatigue Dermatology: denies rash Cardiology: denies chest pain, palpitations, edema Respiratory: denies cough, shortness of breath, wheezing Gastroenterology: +right abdomen/side wall pain; denies nausea, vomiting, diarrhea, constipation Musculoskeletal: denies arthralgias, myalgias, joint swelling, back pain Urology: denies dysuria, difficulty urinating, hematuria, urinary frequency, urgency     Objective:   Physical Exam  General appearance: alert, no distress,  WD/WN, white male Heart: RRR, normal S1, S2, no murmurs Lungs: CTA bilaterally, no wheezes, rhonchi, or rales Abdomen: +bs, soft, moderate point tenderness over right lateral abdomen and somewhat tender over right lower thorax, otherwise non tender, non distended, no masses, no hepatomegaly, no splenomegaly Back: non tender Musculoskeletal: extremities nontender, no swelling, no obvious deformity Extremities: no edema, no cyanosis, no clubbing Pulses: 2+ symmetric, upper and lower extremities  Assessment :    Encounter Diagnoses  Name Primary?  . Abdominal pain Yes  . Abdominal muscle strain      Plan:    We discussed his symptoms and exam findings. At this point the etiology would appear to be abdominal muscle strain. Advised heat pad, relative rest, I demonstrated some gentle range of motion stretches I want him to do everyday this week, and he can take his Mobic or over-the-counter Aleve when necessary.  If not improving or worse within a week, consider CMP, CBC, and abdominal CT.  Urine today normal.

## 2010-12-26 ENCOUNTER — Other Ambulatory Visit: Payer: Self-pay | Admitting: Internal Medicine

## 2010-12-26 MED ORDER — HYDROCODONE-HOMATROPINE 5-1.5 MG/5ML PO SYRP: ORAL_SOLUTION | ORAL | Status: DC

## 2010-12-26 NOTE — Telephone Encounter (Signed)
[facility]ill request [facility] Hycodan [facility] from [facility]. Pls advise if okay for refills.

## 2010-12-26 NOTE — Telephone Encounter (Signed)
Rx called into pharm - LMTCB to inform pt.

## 2010-12-26 NOTE — Telephone Encounter (Signed)
Per CY-okay to refill #244ml take 1 tsp every 6 hours prn cough no refills.

## 2010-12-26 NOTE — Telephone Encounter (Signed)
Spoke with pt and notified that rx was sent to pharm and he verbalized understanding.

## 2010-12-27 ENCOUNTER — Ambulatory Visit (INDEPENDENT_AMBULATORY_CARE_PROVIDER_SITE_OTHER): Payer: Medicare Other

## 2010-12-27 DIAGNOSIS — J309 Allergic rhinitis, unspecified: Secondary | ICD-10-CM

## 2011-01-03 ENCOUNTER — Ambulatory Visit (INDEPENDENT_AMBULATORY_CARE_PROVIDER_SITE_OTHER): Payer: Medicare Other

## 2011-01-03 DIAGNOSIS — J309 Allergic rhinitis, unspecified: Secondary | ICD-10-CM

## 2011-01-11 ENCOUNTER — Ambulatory Visit (INDEPENDENT_AMBULATORY_CARE_PROVIDER_SITE_OTHER): Payer: Medicare Other

## 2011-01-11 DIAGNOSIS — J309 Allergic rhinitis, unspecified: Secondary | ICD-10-CM

## 2011-01-17 ENCOUNTER — Ambulatory Visit (INDEPENDENT_AMBULATORY_CARE_PROVIDER_SITE_OTHER): Payer: Medicare Other

## 2011-01-17 DIAGNOSIS — J309 Allergic rhinitis, unspecified: Secondary | ICD-10-CM

## 2011-01-21 ENCOUNTER — Other Ambulatory Visit: Payer: Self-pay | Admitting: Family Medicine

## 2011-01-21 MED ORDER — ALPRAZOLAM 0.5 MG PO TABS: 0.5000 mg | ORAL_TABLET | ORAL | Status: DC | PRN

## 2011-01-28 ENCOUNTER — Encounter: Payer: Self-pay | Admitting: Family Medicine

## 2011-01-29 ENCOUNTER — Ambulatory Visit (INDEPENDENT_AMBULATORY_CARE_PROVIDER_SITE_OTHER): Payer: Medicare Other

## 2011-01-29 DIAGNOSIS — J309 Allergic rhinitis, unspecified: Secondary | ICD-10-CM

## 2011-02-08 ENCOUNTER — Encounter: Payer: Self-pay | Admitting: Internal Medicine

## 2011-02-22 ENCOUNTER — Ambulatory Visit (INDEPENDENT_AMBULATORY_CARE_PROVIDER_SITE_OTHER): Payer: Medicare Other

## 2011-02-22 DIAGNOSIS — J309 Allergic rhinitis, unspecified: Secondary | ICD-10-CM

## 2011-03-05 ENCOUNTER — Ambulatory Visit (INDEPENDENT_AMBULATORY_CARE_PROVIDER_SITE_OTHER): Payer: Medicare Other

## 2011-03-05 DIAGNOSIS — J309 Allergic rhinitis, unspecified: Secondary | ICD-10-CM

## 2011-03-14 ENCOUNTER — Ambulatory Visit (INDEPENDENT_AMBULATORY_CARE_PROVIDER_SITE_OTHER): Payer: Medicare Other | Admitting: Medical

## 2011-03-14 ENCOUNTER — Telehealth: Payer: Self-pay | Admitting: Family Medicine

## 2011-03-14 ENCOUNTER — Ambulatory Visit
Admission: RE | Admit: 2011-03-14 | Discharge: 2011-03-14 | Disposition: A | Discharge status: Home / Self Care | Payer: Medicare Other | Source: Ambulatory Visit | Attending: Medical | Admitting: Medical

## 2011-03-14 ENCOUNTER — Encounter: Payer: Self-pay | Admitting: Medical

## 2011-03-14 VITALS — BP 140/84 | HR 60 | Temp 98.0°F | Resp 18

## 2011-03-14 DIAGNOSIS — R109 Unspecified abdominal pain: Secondary | ICD-10-CM

## 2011-03-14 DIAGNOSIS — M546 Pain in thoracic spine: Secondary | ICD-10-CM

## 2011-03-14 DIAGNOSIS — M549 Dorsalgia, unspecified: Secondary | ICD-10-CM

## 2011-03-14 DIAGNOSIS — N23 Unspecified renal colic: Secondary | ICD-10-CM

## 2011-03-14 DIAGNOSIS — R110 Nausea: Secondary | ICD-10-CM

## 2011-03-14 LAB — CBC WITH DIFFERENTIAL/PLATELET
Hemoglobin: 12.7 g/dL — ABNORMAL LOW (ref 13.0–17.0)
Lymphocytes Relative: 15 % (ref 12–46)
Lymphs Abs: 1.5 10*3/uL (ref 0.7–4.0)
MCH: 29.3 pg (ref 26.0–34.0)
MCV: 88 fL (ref 78.0–100.0)
Monocytes Relative: 7 % (ref 3–12)
RBC: 4.33 MIL/uL (ref 4.22–5.81)

## 2011-03-14 LAB — BASIC METABOLIC PANEL
Chloride: 106 mEq/L (ref 96–112)
Potassium: 3.7 mEq/L (ref 3.5–5.3)
Sodium: 144 mEq/L (ref 135–145)

## 2011-03-14 LAB — POCT URINALYSIS DIPSTICK
Bilirubin, UA: NEGATIVE
Leukocytes, UA: NEGATIVE
Nitrite, UA: NEGATIVE
Urobilinogen, UA: NEGATIVE
pH, UA: 8

## 2011-03-14 LAB — HEPATIC FUNCTION PANEL
AST: 22 U/L (ref 0–37)
Albumin: 4.6 g/dL (ref 3.5–5.2)
Total Bilirubin: 0.4 mg/dL (ref 0.3–1.2)
Total Protein: 7 g/dL (ref 6.0–8.3)

## 2011-03-14 NOTE — Progress Notes (Signed)
Subjective:   HPI  [name] is a 65 y.o. male who presents for pain, nausea and vomiting.  He notes that he went for a nerve block of the lumbar spine Tuesday.  Did fine on Wednesday, but this morning started having upper back pain that is constant, moderate to sever, throbbing, pulsing.  He notes in the past had similar pain with prior hx/o gall bladder inflammation.  He ultimately had the gall bladder out.  After a while, started getting nauseated and vomited from the pain.  Denies other symptoms, just feels bad.  He has seen cardiology 1 month ago with Dr. Donnie Aho, had stress test and perfusion study that he says was normal.  He denies any bowel changes, no problems with urination, no chest pain, no shortness of breath.  Was feeling fine yesterday.  Denies respiratory c/o, no fever, chills, sweats, weight changes.  No other aggravating or relieving factors.  Denies hx/o renal stones.   Denies recent fall, trauma, injury.  No other c/o.  The following portions of the patient's history were reviewed and updated as appropriate: allergies, current medications, past family history, past medical history, past social history, past surgical history and problem list.  Past Medical History  Diagnosis Date  . CAD (coronary artery disease)     Dr. Donnie Aho  . Myocardial infarct   . Allergic rhinitis     skin test 02/23/08 vaccine 2009  . GERD (gastroesophageal reflux disease)   . Hyperlipidemia   . Arthritis   . Erectile dysfunction   . Depression   . ADD (attention deficit disorder)   . Hypogonadism male    Past Surgical History  Procedure Date  . Lumbar disc surgery   . Cardiac catheterization   . Coronary angioplasty with stent placement   . Cholecystectomy     No Known Allergies  Current Outpatient Prescriptions on File Prior to Visit  Medication Sig Dispense Refill  . albuterol (VENTOLIN HFA) 108 (90 BASE) MCG/ACT inhaler Inhale 2 puffs into the lungs every 4 (four) hours as  needed.        . ALPRAZolam (XANAX) 0.5 MG tablet Take 1 tablet (0.5 mg total) by mouth as needed.  30 tablet  1  . aspirin 81 MG tablet Take 81 mg by mouth daily.        . benzonatate (TESSALON) 200 MG capsule Take 1 capsule (200 mg total) by mouth 3 (three) times daily as needed.  50 capsule  prn  . budesonide-formoterol (SYMBICORT) 160-4.5 MCG/ACT inhaler Inhale 2 puffs into the lungs 2 (two) times daily.        [name] buPROPion (WELLBUTRIN XL) 300 MG 24 hr tablet Take 300 mg by mouth daily.        . cetirizine (ZYRTEC) 10 MG tablet Take 10 mg by mouth daily.        . clopidogrel (PLAVIX) 75 MG tablet as directed.        . desvenlafaxine (PRISTIQ) 50 MG 24 hr tablet Take 50 mg by mouth daily.        [name] diltiazem (CARDIZEM CD) 240 MG 24 hr capsule Take 240 mg by mouth daily.        [name] esomeprazole (NEXIUM) 40 MG capsule Take 40 mg by mouth daily.        [name] ezetimibe-simvastatin (VYTORIN) 10-80 MG per tablet Take 1 tablet by mouth at bedtime.        [name] guaiFENesin (MUCINEX) 600 MG 12 hr tablet Take 1,200 mg  by mouth every 12 (twelve) hours as needed.        [name] HYDROcodone-homatropine (HYCODAN) 5-1.5 MG/5ML syrup Take 1 tsp every 6 hours as needed for cough  200 mL  0  . levocetirizine (XYZAL) 5 MG tablet Take 5 mg by mouth daily as needed.        . meclizine (ANTIVERT) 25 MG tablet TAKE 1 TABLET BY MOUTH TWICE A DAY AS NEEDED  60 tablet  1  . Meclizine HCl 25 MG CHEW Chew 1 tablet by mouth daily.        . meloxicam (MOBIC) 15 MG tablet Take 15 mg by mouth daily.        . methylphenidate (CONCERTA) 54 MG CR tablet Take 54 mg by mouth daily.        . montelukast (SINGULAIR) 10 MG tablet Take 10 mg by mouth at bedtime.        . niacin (NIASPAN) 1000 MG CR tablet Take 1,500 mg by mouth daily.        . nitroGLYCERIN (NITROSTAT) 0.4 MG SL tablet Place 0.4 mg under the tongue every 5 (five) minutes as needed.        [name] omeprazole-sodium bicarbonate (ZEGERID) 40-1100 MG per capsule Take 1 capsule by mouth daily.         . promethazine (PHENERGAN) 12.5 MG tablet Take 12.5 mg by mouth 4 (four) times daily as needed.        . VOLTAREN 1 % GEL As needed         Review of Systems Constitutional: denies fever, chills, sweats, unexpected weight change, fatigue ENT: no runny nose, ear pain, sore throat Cardiology: denies chest pain, palpitations, edema Respiratory: denies cough, shortness of breath, wheezing Gastroenterology: +central abdominal discomfort, nausea, no vomiting, loose stool this morning;   Hematology: denies bleeding or bruising problems Musculoskeletal: upper right back/flank pain, no joint swelling Ophthalmology: denies vision changes Urology: denies dysuria, difficulty urinating, hematuria, urinary frequency, urgency Neurology: no headache, weakness, tingling, numbness      Objective:   Physical Exam  General appearance: alert, no distress, WD/WN, in pain HEENT: normocephalic, sclerae anicteric, TMs pearly, nares patent, no discharge or erythema, pharynx normal Oral cavity: MMM, no lesions Neck: supple, no lymphadenopathy, no thyromegaly, no masses Heart: RRR, normal S1, S2, no murmurs Lungs: CTA bilaterally, no wheezes, rhonchi, or rales Abdomen: +bs, soft, mild epigastric tenderness, non distended, no masses, no hepatomegaly, no splenomegaly Back: tender along right mid to upper back paraspinally, otherwise non tender, +right CVA tenderness Pulses: 2+ symmetric, upper and lower extremities, normal cap refill   Assessment :    Encounter Diagnoses  Name Primary?  . Pain, upper back Yes  . Nausea   . Renal colic   . Abdominal pain     Plan:    Etiology unclear - renal colic/stone vs spasm/strain vs other.  Will get stat labs, will send for CT urogram to rule out stone.  He has hydrocodone and phenergan at home.  He will use Hydrocodone q4hrs for pain, Phenergan q6 hours for nausea/vomiting, rest, stay hydrated.  Call/return if worse or not improving. Discussed case with  Dr. Roosvelt Maser physician.

## 2011-03-15 ENCOUNTER — Ambulatory Visit (INDEPENDENT_AMBULATORY_CARE_PROVIDER_SITE_OTHER): Payer: Medicare Other

## 2011-03-15 DIAGNOSIS — J309 Allergic rhinitis, unspecified: Secondary | ICD-10-CM

## 2011-03-18 ENCOUNTER — Telehealth: Payer: Self-pay | Admitting: *Deleted

## 2011-03-19 DIAGNOSIS — Z0289 Encounter for other administrative examinations: Secondary | ICD-10-CM

## 2011-03-19 LAB — DIFFERENTIAL
Eosinophils Absolute: 0
Eosinophils Relative: 0
Lymphocytes Relative: 21
Lymphs Abs: 1.8
Monocytes Relative: 6
Neutrophils Relative %: 73

## 2011-03-19 LAB — CBC
HCT: 36.2 — ABNORMAL LOW
HCT: 40.2
Hemoglobin: 12.2 — ABNORMAL LOW
MCV: 85.4
Platelets: 219
RBC: 4.14 — ABNORMAL LOW
RBC: 4.7
WBC: 6.2
WBC: 8.5

## 2011-03-19 LAB — URINALYSIS, ROUTINE W REFLEX MICROSCOPIC
Glucose, UA: NEGATIVE
Hgb urine dipstick: NEGATIVE
Leukocytes, UA: NEGATIVE
Protein, ur: 30 — AB
pH: 6

## 2011-03-19 LAB — BASIC METABOLIC PANEL
Calcium: 9
Chloride: 107
GFR calc Af Amer: >60
GFR calc Af Amer: >60
GFR calc non Af Amer: >60
GFR calc non Af Amer: >60
Glucose, Bld: 157 — ABNORMAL HIGH
Potassium: 3.4 — ABNORMAL LOW
Potassium: 4.3
Sodium: 141

## 2011-03-19 LAB — URINE MICROSCOPIC-ADD ON

## 2011-03-20 ENCOUNTER — Ambulatory Visit (INDEPENDENT_AMBULATORY_CARE_PROVIDER_SITE_OTHER): Payer: Medicare Other

## 2011-03-20 DIAGNOSIS — J309 Allergic rhinitis, unspecified: Secondary | ICD-10-CM

## 2011-03-20 NOTE — Telephone Encounter (Signed)
Done

## 2011-03-29 ENCOUNTER — Ambulatory Visit (INDEPENDENT_AMBULATORY_CARE_PROVIDER_SITE_OTHER): Payer: Medicare Other

## 2011-03-29 DIAGNOSIS — J309 Allergic rhinitis, unspecified: Secondary | ICD-10-CM

## 2011-04-01 ENCOUNTER — Ambulatory Visit (INDEPENDENT_AMBULATORY_CARE_PROVIDER_SITE_OTHER): Payer: Medicare Other

## 2011-04-01 DIAGNOSIS — J309 Allergic rhinitis, unspecified: Secondary | ICD-10-CM

## 2011-04-02 ENCOUNTER — Ambulatory Visit: Payer: Medicare Other

## 2011-04-03 NOTE — Telephone Encounter (Signed)
Spoke with [name] regarding this pt.  She states pt came in and discussed his concerns with her in person.  Therefore will sign off on message.

## 2011-04-04 ENCOUNTER — Ambulatory Visit (INDEPENDENT_AMBULATORY_CARE_PROVIDER_SITE_OTHER): Payer: Medicare Other

## 2011-04-04 DIAGNOSIS — J309 Allergic rhinitis, unspecified: Secondary | ICD-10-CM

## 2011-04-11 ENCOUNTER — Ambulatory Visit (INDEPENDENT_AMBULATORY_CARE_PROVIDER_SITE_OTHER): Payer: Medicare Other

## 2011-04-11 DIAGNOSIS — J309 Allergic rhinitis, unspecified: Secondary | ICD-10-CM

## 2011-04-17 ENCOUNTER — Ambulatory Visit (INDEPENDENT_AMBULATORY_CARE_PROVIDER_SITE_OTHER): Payer: Medicare Other

## 2011-04-17 DIAGNOSIS — J309 Allergic rhinitis, unspecified: Secondary | ICD-10-CM

## 2011-04-23 ENCOUNTER — Ambulatory Visit (INDEPENDENT_AMBULATORY_CARE_PROVIDER_SITE_OTHER): Payer: Medicare Other

## 2011-04-23 DIAGNOSIS — J309 Allergic rhinitis, unspecified: Secondary | ICD-10-CM

## 2011-04-23 DIAGNOSIS — Z23 Encounter for immunization: Secondary | ICD-10-CM

## 2011-05-03 ENCOUNTER — Ambulatory Visit (INDEPENDENT_AMBULATORY_CARE_PROVIDER_SITE_OTHER): Payer: Medicare Other

## 2011-05-03 DIAGNOSIS — J309 Allergic rhinitis, unspecified: Secondary | ICD-10-CM

## 2011-05-08 ENCOUNTER — Ambulatory Visit (INDEPENDENT_AMBULATORY_CARE_PROVIDER_SITE_OTHER): Payer: Medicare Other

## 2011-05-08 DIAGNOSIS — J309 Allergic rhinitis, unspecified: Secondary | ICD-10-CM

## 2011-05-13 ENCOUNTER — Encounter: Payer: Self-pay | Admitting: Internal Medicine

## 2011-05-15 ENCOUNTER — Ambulatory Visit (INDEPENDENT_AMBULATORY_CARE_PROVIDER_SITE_OTHER): Payer: Medicare Other | Admitting: Medical

## 2011-05-15 ENCOUNTER — Encounter: Payer: Self-pay | Admitting: Medical

## 2011-05-15 VITALS — BP 112/80 | HR 68 | Temp 97.8°F | Resp 16 | Wt 170.0 lb

## 2011-05-15 DIAGNOSIS — R509 Fever, unspecified: Secondary | ICD-10-CM

## 2011-05-15 DIAGNOSIS — R5383 Other fatigue: Secondary | ICD-10-CM

## 2011-05-15 DIAGNOSIS — J40 Bronchitis, not specified as acute or chronic: Secondary | ICD-10-CM

## 2011-05-15 DIAGNOSIS — R5381 Other malaise: Secondary | ICD-10-CM

## 2011-05-15 MED ORDER — PROMETHAZINE HCL 12.5 MG PO TABS: 12.5000 mg | ORAL_TABLET | Freq: Four times a day (QID) | ORAL | Status: DC | PRN

## 2011-05-15 MED ORDER — AMOXICILLIN-POT CLAVULANATE 875-125 MG PO TABS: 1.0000 | ORAL_TABLET | Freq: Two times a day (BID) | ORAL | Status: AC

## 2011-05-15 NOTE — Patient Instructions (Signed)
REST, stay hydrated.  Begin Augmentin antibiotic, twice daily for 10 days.  You can use Coricidin HBP or Mucinex DM OTC, or you can alternate them.  Don't take them simultaneously though.  Use your inhaler at least twice daily for the next few days until improving.  Use the Hycodan syrup that Dr. Maple Hudson has prescribed every 4-6 hours as needed for cough.   If you are having worse breathing, fever, or just no better in 1-2 days, let me know.

## 2011-05-15 NOTE — Progress Notes (Signed)
Subjective:   HPI  [name] is a 65 y.o. male who presents with bad headache, nausea, diarrhea, cough, phlegm that won't come up, body aches, subjective fever, chills, sweats.  Been feeling bad for 5 days.  Denies sick contacts.  Using Coricidin HBP.  Has lots of chest congestion.  Used Mucinex last night. Lying down really worsens the cough.  Sleeping on incline.  Feels wheezy, but hasn't used inhaler.  Diarrhea is about once daily, vomited twice.  No other aggravating or relieving factors.    No other c/o.  The following portions of the patient's history were reviewed and updated as appropriate: allergies, current medications, past family history, past medical history, past social history, past surgical history and problem list.  Past Medical History  Diagnosis Date  . CAD (coronary artery disease)     Dr. Donnie Aho  . Myocardial infarct   . Allergic rhinitis     skin test 02/23/08 vaccine 2009  . GERD (gastroesophageal reflux disease)   . Hyperlipidemia   . Arthritis   . Erectile dysfunction   . Depression   . ADD (attention deficit disorder)   . Hypogonadism male    Review of Systems Constitutional: -fever, -chills, +sweats, -unexpected -weight change,-fatigue ENT: +runny nose, -ear pain, +sore throat Cardiology:  -chest pain, -palpitations, -edema Respiratory: +cough, +shortness of breath, -wheezing Gastroenterology: -abdominal pain, +nausea, +vomiting, +diarrhea, -constipation Hematology: -bleeding or bruising problems Musculoskeletal: -arthralgias, -myalgias, -joint swelling, -back pain Ophthalmology: -vision changes Urology: -dysuria, -difficulty urinating, -hematuria, -urinary frequency, -urgency Neurology: +headache, +weakness, -tingling, -numbness, +vertigo   Objective:     General: ill-appearing, well-developed, well-nourished Skin: Hot, moist HEENT: Nose inflamed and congested, clear conjunctiva, TMs pearly, no sinus tenderness, pharynx with erythema, no  exudates Neck: Supple, nontender, shotty cervical adenopathy Heart: Regular rate and rhythm, normal S1, S2, no murmurs Lungs: +scattered rhonchi, decreased breath sounds, no wheezes Abdomen: Nontender non distended Extremities: Mild generalized tenderness      Assessment and Plan:   Encounter Diagnoses  Name Primary?  . Bronchitis Yes  . Fever   . Fatigue    Advised that exam and symptoms suggest that he has likely had influenza.  We will treat empirically for secondary bacterial respiratory infection, rest, hydrate well, Ibuprofen or Tylenol for fever/aches, use inhaler BID - QID prn, scripts today for Augmentin.  He has cough syrup and inhaler through pulmonology.  Refilled promethazine for nausea prn.  Advised if not improving or worse in the next 1-2 days, call or return.  If not improving, worse, or dyspnea, return in the event we need to consider inpatient therapy.

## 2011-05-17 ENCOUNTER — Telehealth: Payer: Self-pay | Admitting: Medical

## 2011-05-17 ENCOUNTER — Encounter: Payer: Self-pay | Admitting: Medical

## 2011-05-17 ENCOUNTER — Ambulatory Visit (INDEPENDENT_AMBULATORY_CARE_PROVIDER_SITE_OTHER): Payer: Medicare Other | Admitting: Medical

## 2011-05-17 VITALS — BP 140/88 | HR 84 | Temp 97.7°F | Wt 170.0 lb

## 2011-05-17 DIAGNOSIS — J45909 Unspecified asthma, uncomplicated: Secondary | ICD-10-CM | POA: Insufficient documentation

## 2011-05-17 DIAGNOSIS — J45901 Unspecified asthma with (acute) exacerbation: Secondary | ICD-10-CM

## 2011-05-17 DIAGNOSIS — R5383 Other fatigue: Secondary | ICD-10-CM

## 2011-05-17 DIAGNOSIS — R0602 Shortness of breath: Secondary | ICD-10-CM | POA: Insufficient documentation

## 2011-05-17 DIAGNOSIS — J40 Bronchitis, not specified as acute or chronic: Secondary | ICD-10-CM

## 2011-05-17 DIAGNOSIS — R5381 Other malaise: Secondary | ICD-10-CM

## 2011-05-17 HISTORY — DX: Unspecified asthma, uncomplicated: J45.909

## 2011-05-17 LAB — CBC WITH DIFFERENTIAL/PLATELET
Basophils Relative: 0 % (ref 0–1)
Eosinophils Absolute: 0.1 10*3/uL (ref 0.0–0.7)
HCT: 39.5 % (ref 39.0–52.0)
Hemoglobin: 12.9 g/dL — ABNORMAL LOW (ref 13.0–17.0)
Lymphs Abs: 1.7 10*3/uL (ref 0.7–4.0)
MCH: 29.7 pg (ref 26.0–34.0)
MCHC: 32.7 g/dL (ref 30.0–36.0)
MCV: 90.8 fL (ref 78.0–100.0)
Monocytes Absolute: 0.6 10*3/uL (ref 0.1–1.0)
Monocytes Relative: 10 % (ref 3–12)
Neutrophils Relative %: 58 % (ref 43–77)
RBC: 4.35 MIL/uL (ref 4.22–5.81)

## 2011-05-17 MED ORDER — METHYLPREDNISOLONE ACETATE 40 MG/ML IJ SUSP: 80.0000 mg | Freq: Once | INTRAMUSCULAR | Status: DC

## 2011-05-17 NOTE — Telephone Encounter (Signed)
Pt came in.

## 2011-05-17 NOTE — Progress Notes (Signed)
Addended by: Lavell Islam on: 05/17/2011 02:22 PM   Modules accepted: Orders

## 2011-05-17 NOTE — Telephone Encounter (Signed)
Pt called and stated he was not any better. He was coughing a lot on the phone. Pt states he at the least needs more cough med. Please call pt. Pt uses [facility] [address]

## 2011-05-17 NOTE — Progress Notes (Signed)
Subjective:   HPI  [name] is a 65 y.o. male who presents with recheck.  I saw him earlier in the week for similar flu like illness.  Here today with his wife.  He is still coughing his head off, still feels wheezy, short of breath, particularly early morning and late in evening.  Denies fever, but has chills.  Hasn't vomited any more since the other day.  Does have some diarrhea,a but likely due to the Augmentin. He is still on his usual Symbicort, alternating Mucinex and Coricidin, using Hycodan syrup and Tessalon Perles alternatively, and using inhaler 3 times daily, just feel weak and on better.  No other aggravating or relieving factors.    No other c/o.  The following portions of the patient's history were reviewed and updated as appropriate: allergies, current medications, past family history, past medical history, past social history, past surgical history and problem list.  Past Medical History  Diagnosis Date  . CAD (coronary artery disease)     Dr. Donnie Aho  . Myocardial infarct   . Allergic rhinitis     skin test 02/23/08 vaccine 2009  . GERD (gastroesophageal reflux disease)   . Hyperlipidemia   . Arthritis   . Erectile dysfunction   . Depression   . ADD (attention deficit disorder)   . Hypogonadism male    Review of Systems Constitutional: -fever, -chills, +sweats, -unexpected -weight change,-fatigue ENT: +runny nose, -ear pain, +sore throat Cardiology:  -chest pain, -palpitations, -edema Respiratory: +cough, +shortness of breath, -wheezing Gastroenterology: -abdominal pain, +nausea, +vomiting, +diarrhea, -constipation Hematology: -bleeding or bruising problems Musculoskeletal: -arthralgias, -myalgias, -joint swelling, -back pain Ophthalmology: -vision changes Urology: -dysuria, -difficulty urinating, -hematuria, -urinary frequency, -urgency Neurology: +headache, +weakness, -tingling, -numbness, +vertigo   Objective:     General: ill-appearing,  well-developed, well-nourished, ill appearing Skin: warm, moist HEENT: Nose inflamed and congested, clear conjunctiva, TMs pearly, no sinus tenderness, pharynx with erythema, no exudates Neck: Supple, nontender, shotty cervical adenopathy Heart: Regular rate and rhythm, normal S1, S2, no murmurs Lungs: +scattered rhonchi, decreased breath sounds, scattered wheezes Abdomen: Nontender non distended Extremities: Mild generalized tenderness      Assessment and Plan:   Encounter Diagnoses  Name Primary?  [name] Asthma exacerbation Yes  . Fatigue   . Tracheobronchitis   . Shortness of breath      Chest xray today with narrowed trachea superiorly suggestive of steeple sign, and enlarged lower trachea, but otherwise no pneumonia or mass, no other acute changes.  Will send CXR for overread.   Pulse ox 94% room air.   Depo Medrol 80mg  IM in office today.  He will  C/t Augmentin, c/t albuterol 3-4 times daily, rest, hydrate well, c/t alternating Mucinex and Coricidin HBP, c/t other medication as usual.  Refilled his Hycodan syrup to help with cough.  If much worse over the weekend, can't breath, worse SOB, fever, etc, go to the ED.   otherwise call report Monday.   Discussed case, xray and plan with supervising physician, Dr. Susann Givens.

## 2011-05-27 ENCOUNTER — Encounter: Payer: Self-pay | Admitting: Family Medicine

## 2011-05-27 ENCOUNTER — Ambulatory Visit (INDEPENDENT_AMBULATORY_CARE_PROVIDER_SITE_OTHER): Payer: Medicare Other | Admitting: Family Medicine

## 2011-05-27 VITALS — BP 108/70 | HR 98 | Wt 174.0 lb

## 2011-05-27 DIAGNOSIS — Z638 Other specified problems related to primary support group: Secondary | ICD-10-CM

## 2011-05-27 DIAGNOSIS — Z6379 Other stressful life events affecting family and household: Secondary | ICD-10-CM

## 2011-05-27 DIAGNOSIS — G44209 Tension-type headache, unspecified, not intractable: Secondary | ICD-10-CM

## 2011-05-27 NOTE — Progress Notes (Signed)
  Subjective:    Patient ID: [name], male    DOB: [birth year], 65 y.o.   MRN: [id number]  HPI He complains of difficulty with the relatively sudden onset of frontal and except for headache followed by nausea and vomiting. His had no blurred vision, double vision, photophobia or phonophobia. He has been dealing with a lot of family related stress specifically dealing with his daughter who apparently has chronic fatigue syndrome and unable to work. She is apparently applying for disability. He would also like me to start prescribing his psychotropic medications again to help keep his costs down . He seems to be doing fairly well on his present medication regimen.  Review of Systems     Objective:   Physical Exam alert and in no distress. Tympanic membranes and canals are normal. Throat is clear. Tonsils are normal. Neck is supple without adenopathy or thyromegaly. Cardiac exam shows a regular sinus rhythm without murmurs or gallops. Lungs are clear to auscultation. No tenderness to palpation over the frontal area or occipital area.       Assessment & Plan:   1. Tension headache   2. Stressful life event affecting family    discussed rest and stress management with him. Strongly encouraged him to get in touch with his counselor and deal more positively with this. Also discussed the need for him to back away from being a caregiver for his daughter.

## 2011-05-27 NOTE — Patient Instructions (Signed)
Followup with her therapist, Evette Cristal.

## 2011-05-29 ENCOUNTER — Ambulatory Visit (INDEPENDENT_AMBULATORY_CARE_PROVIDER_SITE_OTHER): Payer: Medicare Other

## 2011-05-29 DIAGNOSIS — J309 Allergic rhinitis, unspecified: Secondary | ICD-10-CM

## 2011-06-13 ENCOUNTER — Ambulatory Visit (INDEPENDENT_AMBULATORY_CARE_PROVIDER_SITE_OTHER): Payer: Medicare Other

## 2011-06-13 DIAGNOSIS — J309 Allergic rhinitis, unspecified: Secondary | ICD-10-CM

## 2011-06-20 ENCOUNTER — Ambulatory Visit (INDEPENDENT_AMBULATORY_CARE_PROVIDER_SITE_OTHER): Payer: Medicare Other | Admitting: Internal Medicine

## 2011-06-20 ENCOUNTER — Encounter: Payer: Self-pay | Admitting: Internal Medicine

## 2011-06-20 ENCOUNTER — Ambulatory Visit (INDEPENDENT_AMBULATORY_CARE_PROVIDER_SITE_OTHER): Payer: Medicare Other

## 2011-06-20 DIAGNOSIS — J45901 Unspecified asthma with (acute) exacerbation: Secondary | ICD-10-CM

## 2011-06-20 DIAGNOSIS — J301 Allergic rhinitis due to pollen: Secondary | ICD-10-CM

## 2011-06-20 DIAGNOSIS — J309 Allergic rhinitis, unspecified: Secondary | ICD-10-CM

## 2011-06-20 MED ORDER — BUDESONIDE-FORMOTEROL FUMARATE 160-4.5 MCG/ACT IN AERO: 2.0000 | INHALATION_SPRAY | Freq: Two times a day (BID) | RESPIRATORY_TRACT | Status: DC

## 2011-06-20 NOTE — Progress Notes (Signed)
Patient ID: [name], male    DOB: 1946-05-23, 66 y.o.   MRN: [id number]  HPI 12/18/10- 24 yo M followed for asthma, allergic rhinitis complicated by hx ADHD, CAD/ MI, GERD Last here June 21, 2010  Continues allergy vaccine 1:2 GH. In last 2-3 weeks, if he rushes or gets tense, he coughs and gets chest pains, especially going in and out of heat. Describes the pains as an airway soreness with some chest muscle soreness with increased raspy cough. Wife notes his raspy throat clearing. Mostly chest and throat, with persistent, "controlled" nasal drainage. He does garden and maintain yard. Has trouble remembering Symbicort.   [birth year]64 yo M never smoker followed for asthma, allergic rhinitis complicated by hx ADHD, CAD/ MI, GERD Had flu vaccine. He notices what feel like chest wall pains while lying in bed, in cold air or after exertion. He tends to cough for about 15 minutes after he first lies down, nonproductive and without awareness of reflux or postnasal drip. Aware of bruxism during the daytime which he will discuss with his dentist.  Review of Systems Constitutional:   No-   weight loss, night sweats, fevers, chills, fatigue, lassitude. HEENT:   No-  headaches, difficulty swallowing, tooth/dental problems, sore throat,       No-  sneezing, itching, ear ache, nasal congestion, post nasal drip,  CV:  +chest pain, No- orthopnea, PND, swelling in lower extremities, anasarca, dizziness, palpitations Resp: No-   shortness of breath with exertion or at rest.              No-   productive cough,  + non-productive cough,  No- coughing up of blood.              No-   change in color of mucus.  No- wheezing.   Skin: No-   rash or lesions. GI:  No-   heartburn, indigestion, abdominal pain, nausea, vomiting, diarrhea,                 change in bowel habits, loss of appetite GU:  MS:  No-   joint pain or swelling.  No- decreased range of motion.  No- back pain. Neuro-     nothing  unusual Psych:  No- change in mood or affect. No depression or anxiety.  No memory loss.       Objective:   Physical Exam General- Alert, Oriented, Affect-appropriate, Distress- none acute Skin- rash-none, lesions- none, excoriation- none Lymphadenopathy- none Head- atraumatic            Eyes- Gross vision intact, PERRLA, conjunctivae clear secretions            Ears- Hearing, canals-normal            Nose- Clear, no-Septal dev, mucus, polyps, erosion, perforation             Throat- Mallampati II , mucosa clear , drainage- none, tonsils- atrophic Neck- flexible , trachea midline, no stridor , thyroid nl, carotid no bruit Chest - symmetrical excursion , unlabored           Heart/CV- RRR , no murmur , no gallop  , no rub, nl s1 s2                           - JVD- none , edema- none, stasis changes- none, varices- none           Lung- clear to P&A,  wheeze- none, cough- none , dullness-none, rub- none           Chest wall-  Abd- Br/ Gen/ Rectal- Not done, not indicated Extrem- cyanosis- none, clubbing, none, atrophy- none, strength- nl Neuro- grossly intact to observation

## 2011-06-20 NOTE — Patient Instructions (Signed)
Consider tylenol, heating pad, muscle rub oitment etc for sore muscles at night, if needed  Consider benzonatate pearl a little before bedtime for the cough.  Always "self-inventory" whether cough is related to increased postnasal drip, reflux or something else we need to address.  Script sent for Symbicort

## 2011-06-23 NOTE — Assessment & Plan Note (Signed)
Discussed relatively quiet status for this time of year, anticipating early pollen season. He continues allergy vaccine without problems.

## 2011-06-23 NOTE — Assessment & Plan Note (Signed)
Bronchospasm seems well-controlled. I think he is describing benign musculoskeletal chest wall pains after unfamiliar exercise but we will watch that for significance to his pulmonary status.

## 2011-06-25 ENCOUNTER — Other Ambulatory Visit: Payer: Self-pay | Admitting: Internal Medicine

## 2011-06-25 ENCOUNTER — Other Ambulatory Visit: Payer: Self-pay | Admitting: Cardiology

## 2011-06-26 ENCOUNTER — Ambulatory Visit (INDEPENDENT_AMBULATORY_CARE_PROVIDER_SITE_OTHER): Payer: Medicare Other | Admitting: Family Medicine

## 2011-06-26 ENCOUNTER — Encounter: Payer: Self-pay | Admitting: Family Medicine

## 2011-06-26 VITALS — BP 120/78 | Ht 67.5 in | Wt 174.0 lb

## 2011-06-26 DIAGNOSIS — M7060 Trochanteric bursitis, unspecified hip: Secondary | ICD-10-CM

## 2011-06-26 DIAGNOSIS — M76899 Other specified enthesopathies of unspecified lower limb, excluding foot: Secondary | ICD-10-CM

## 2011-06-26 NOTE — Progress Notes (Signed)
  Subjective:    Patient ID: [name], male    DOB: 05-01-1946, 66 y.o.   MRN: [id number]  HPI He has a two-week history of left hip pain. It bothers him more when he lies on that side and also when he gets up out of her chair. He has had no increase in his physical activities. No other joints are involved.  Review of Systems     Objective:   Physical Exam Full motion of the hip with no pain. Some tenderness to palpation over the left greater trochanter.       Assessment & Plan:  Greater trochanteric bursitis. I discussed options with him including conservative care PT and anti-inflammatories as well as injection. He has decided to have an injection. 40 mg of Kenalog and 3 cc of Xylocaine was injected into the left greater trochanteric bursa without difficulty. He obtained relief of his symptoms relatively quickly. He will call if further difficulties.

## 2011-07-02 ENCOUNTER — Telehealth: Payer: Self-pay | Admitting: Internal Medicine

## 2011-07-02 MED ORDER — ALPRAZOLAM 0.5 MG PO TABS: 0.5000 mg | ORAL_TABLET | ORAL | Status: DC | PRN

## 2011-07-02 NOTE — Telephone Encounter (Signed)
Med called in

## 2011-07-02 NOTE — Telephone Encounter (Signed)
Renew his Xanax 

## 2011-07-02 NOTE — Telephone Encounter (Signed)
Called xanaxin 

## 2011-07-05 ENCOUNTER — Ambulatory Visit (INDEPENDENT_AMBULATORY_CARE_PROVIDER_SITE_OTHER): Payer: Medicare Other

## 2011-07-05 DIAGNOSIS — J309 Allergic rhinitis, unspecified: Secondary | ICD-10-CM

## 2011-07-10 ENCOUNTER — Ambulatory Visit (INDEPENDENT_AMBULATORY_CARE_PROVIDER_SITE_OTHER): Payer: Medicare Other

## 2011-07-10 DIAGNOSIS — J309 Allergic rhinitis, unspecified: Secondary | ICD-10-CM

## 2011-07-11 ENCOUNTER — Other Ambulatory Visit: Payer: Self-pay | Admitting: Cardiology

## 2011-07-25 ENCOUNTER — Ambulatory Visit (INDEPENDENT_AMBULATORY_CARE_PROVIDER_SITE_OTHER): Payer: Medicare Other

## 2011-07-25 DIAGNOSIS — J309 Allergic rhinitis, unspecified: Secondary | ICD-10-CM

## 2011-07-31 ENCOUNTER — Ambulatory Visit (INDEPENDENT_AMBULATORY_CARE_PROVIDER_SITE_OTHER): Payer: Medicare Other

## 2011-07-31 DIAGNOSIS — J309 Allergic rhinitis, unspecified: Secondary | ICD-10-CM

## 2011-08-06 ENCOUNTER — Ambulatory Visit (INDEPENDENT_AMBULATORY_CARE_PROVIDER_SITE_OTHER): Payer: Medicare Other

## 2011-08-06 DIAGNOSIS — J309 Allergic rhinitis, unspecified: Secondary | ICD-10-CM

## 2011-08-13 ENCOUNTER — Telehealth: Payer: Self-pay | Admitting: Family Medicine

## 2011-08-13 MED ORDER — MECLIZINE HCL 25 MG PO TABS: ORAL_TABLET | ORAL | Status: DC

## 2011-08-13 MED ORDER — ALPRAZOLAM 0.5 MG PO TABS: 0.5000 mg | ORAL_TABLET | Freq: Every evening | ORAL | Status: AC | PRN

## 2011-08-13 MED ORDER — ALPRAZOLAM 0.5 MG PO TABS: 0.5000 mg | ORAL_TABLET | ORAL | Status: DC | PRN

## 2011-08-13 NOTE — Telephone Encounter (Signed)
Called in xanax and e-prescribed antivert 25mg .

## 2011-08-13 NOTE — Telephone Encounter (Signed)
Renew both of these 

## 2011-08-15 ENCOUNTER — Ambulatory Visit (INDEPENDENT_AMBULATORY_CARE_PROVIDER_SITE_OTHER): Payer: Medicare Other

## 2011-08-15 DIAGNOSIS — J309 Allergic rhinitis, unspecified: Secondary | ICD-10-CM

## 2011-08-20 ENCOUNTER — Ambulatory Visit (INDEPENDENT_AMBULATORY_CARE_PROVIDER_SITE_OTHER): Payer: Medicare Other

## 2011-08-20 DIAGNOSIS — J309 Allergic rhinitis, unspecified: Secondary | ICD-10-CM

## 2011-08-28 ENCOUNTER — Ambulatory Visit (INDEPENDENT_AMBULATORY_CARE_PROVIDER_SITE_OTHER): Payer: Medicare Other

## 2011-08-28 DIAGNOSIS — J309 Allergic rhinitis, unspecified: Secondary | ICD-10-CM

## 2011-08-29 ENCOUNTER — Ambulatory Visit (INDEPENDENT_AMBULATORY_CARE_PROVIDER_SITE_OTHER): Payer: Medicare Other

## 2011-08-29 DIAGNOSIS — J309 Allergic rhinitis, unspecified: Secondary | ICD-10-CM

## 2011-09-03 ENCOUNTER — Telehealth: Payer: Self-pay | Admitting: Family Medicine

## 2011-09-03 NOTE — Telephone Encounter (Signed)
Refer him to orthopedics

## 2011-09-05 ENCOUNTER — Ambulatory Visit (INDEPENDENT_AMBULATORY_CARE_PROVIDER_SITE_OTHER): Payer: Medicare Other

## 2011-09-05 DIAGNOSIS — J309 Allergic rhinitis, unspecified: Secondary | ICD-10-CM

## 2011-09-05 NOTE — Telephone Encounter (Signed)
Faxed info to [facility] Pt informed

## 2011-09-12 ENCOUNTER — Ambulatory Visit (INDEPENDENT_AMBULATORY_CARE_PROVIDER_SITE_OTHER): Payer: Medicare Other

## 2011-09-12 DIAGNOSIS — J309 Allergic rhinitis, unspecified: Secondary | ICD-10-CM

## 2011-09-20 ENCOUNTER — Ambulatory Visit (INDEPENDENT_AMBULATORY_CARE_PROVIDER_SITE_OTHER): Payer: Medicare Other

## 2011-09-20 ENCOUNTER — Telehealth: Payer: Self-pay | Admitting: Family Medicine

## 2011-09-20 DIAGNOSIS — J309 Allergic rhinitis, unspecified: Secondary | ICD-10-CM

## 2011-09-20 MED ORDER — ALPRAZOLAM 0.25 MG PO TABS: 0.2500 mg | ORAL_TABLET | Freq: Three times a day (TID) | ORAL | Status: DC | PRN

## 2011-09-20 MED ORDER — METHYLPHENIDATE HCL ER (OSM) 54 MG PO TBCR: 54.0000 mg | EXTENDED_RELEASE_TABLET | Freq: Every day | ORAL | Status: DC

## 2011-09-20 NOTE — Telephone Encounter (Signed)
Concerta and Xanax written.

## 2011-09-23 ENCOUNTER — Encounter: Payer: Self-pay | Admitting: Internal Medicine

## 2011-09-25 ENCOUNTER — Telehealth: Payer: Self-pay | Admitting: Family Medicine

## 2011-09-25 NOTE — Telephone Encounter (Signed)
LM

## 2011-09-27 ENCOUNTER — Ambulatory Visit (INDEPENDENT_AMBULATORY_CARE_PROVIDER_SITE_OTHER): Payer: Medicare Other

## 2011-09-27 DIAGNOSIS — J309 Allergic rhinitis, unspecified: Secondary | ICD-10-CM

## 2011-10-03 ENCOUNTER — Ambulatory Visit (INDEPENDENT_AMBULATORY_CARE_PROVIDER_SITE_OTHER): Payer: Medicare Other

## 2011-10-03 DIAGNOSIS — J309 Allergic rhinitis, unspecified: Secondary | ICD-10-CM

## 2011-10-11 ENCOUNTER — Encounter (HOSPITAL_BASED_OUTPATIENT_CLINIC_OR_DEPARTMENT_OTHER): Payer: Self-pay | Admitting: *Deleted

## 2011-10-11 ENCOUNTER — Ambulatory Visit (INDEPENDENT_AMBULATORY_CARE_PROVIDER_SITE_OTHER): Payer: Medicare Other

## 2011-10-11 DIAGNOSIS — J309 Allergic rhinitis, unspecified: Secondary | ICD-10-CM

## 2011-10-11 NOTE — Progress Notes (Signed)
Bring all medications. Attempted to call Dr. York Spaniel office to request notes, testing and EKG but closed at 2pm - left Karin Golden note to request for Monday. Coming Monday afternoon for CXR, BMET.

## 2011-10-14 ENCOUNTER — Encounter (HOSPITAL_BASED_OUTPATIENT_CLINIC_OR_DEPARTMENT_OTHER)
Admission: RE | Admit: 2011-10-14 | Discharge: 2011-10-14 | Disposition: A | Discharge status: Home / Self Care | Payer: Medicare Other | Source: Ambulatory Visit | Attending: Orthopedic Surgery | Admitting: Orthopedic Surgery

## 2011-10-14 ENCOUNTER — Ambulatory Visit
Admission: RE | Admit: 2011-10-14 | Discharge: 2011-10-14 | Disposition: A | Discharge status: Home / Self Care | Payer: Medicare Other | Source: Ambulatory Visit | Attending: Anesthesiology | Admitting: Anesthesiology

## 2011-10-14 LAB — BASIC METABOLIC PANEL
BUN: 14 mg/dL (ref 6–23)
CO2: 26 mEq/L (ref 19–32)
Chloride: 104 mEq/L (ref 96–112)
Creatinine, Ser: 1.04 mg/dL (ref 0.50–1.35)
GFR calc Af Amer: 85 mL/min — ABNORMAL LOW (ref >90)
Glucose, Bld: 88 mg/dL (ref 70–99)

## 2011-10-16 ENCOUNTER — Encounter (HOSPITAL_BASED_OUTPATIENT_CLINIC_OR_DEPARTMENT_OTHER): Payer: Self-pay | Admitting: Certified Registered Nurse Anesthetist

## 2011-10-16 ENCOUNTER — Encounter (HOSPITAL_BASED_OUTPATIENT_CLINIC_OR_DEPARTMENT_OTHER)
Admission: RE | Disposition: A | Discharge status: Home / Self Care | Payer: Self-pay | Source: Ambulatory Visit | Attending: Orthopedic Surgery

## 2011-10-16 ENCOUNTER — Ambulatory Visit (HOSPITAL_BASED_OUTPATIENT_CLINIC_OR_DEPARTMENT_OTHER)
Admission: RE | Admit: 2011-10-16 | Discharge: 2011-10-16 | Disposition: A | Discharge status: Home / Self Care | Payer: Medicare Other | Source: Ambulatory Visit | Attending: Orthopedic Surgery | Admitting: Orthopedic Surgery

## 2011-10-16 ENCOUNTER — Ambulatory Visit (HOSPITAL_BASED_OUTPATIENT_CLINIC_OR_DEPARTMENT_OTHER): Payer: Medicare Other | Admitting: Certified Registered Nurse Anesthetist

## 2011-10-16 ENCOUNTER — Encounter (HOSPITAL_BASED_OUTPATIENT_CLINIC_OR_DEPARTMENT_OTHER): Payer: Self-pay | Admitting: *Deleted

## 2011-10-16 DIAGNOSIS — Z01812 Encounter for preprocedural laboratory examination: Secondary | ICD-10-CM | POA: Insufficient documentation

## 2011-10-16 DIAGNOSIS — I10 Essential (primary) hypertension: Secondary | ICD-10-CM | POA: Insufficient documentation

## 2011-10-16 DIAGNOSIS — I252 Old myocardial infarction: Secondary | ICD-10-CM | POA: Insufficient documentation

## 2011-10-16 DIAGNOSIS — M79673 Pain in unspecified foot: Secondary | ICD-10-CM

## 2011-10-16 DIAGNOSIS — M2020 Hallux rigidus, unspecified foot: Secondary | ICD-10-CM | POA: Insufficient documentation

## 2011-10-16 DIAGNOSIS — J45909 Unspecified asthma, uncomplicated: Secondary | ICD-10-CM | POA: Insufficient documentation

## 2011-10-16 DIAGNOSIS — Z01810 Encounter for preprocedural cardiovascular examination: Secondary | ICD-10-CM | POA: Insufficient documentation

## 2011-10-16 DIAGNOSIS — K219 Gastro-esophageal reflux disease without esophagitis: Secondary | ICD-10-CM | POA: Insufficient documentation

## 2011-10-16 DIAGNOSIS — I2510 Atherosclerotic heart disease of native coronary artery without angina pectoris: Secondary | ICD-10-CM | POA: Insufficient documentation

## 2011-10-16 DIAGNOSIS — R51 Headache: Secondary | ICD-10-CM | POA: Insufficient documentation

## 2011-10-16 HISTORY — DX: Anxiety disorder, unspecified: F41.9

## 2011-10-16 HISTORY — DX: Anemia, unspecified: D64.9

## 2011-10-16 HISTORY — PX: CHEILECTOMY: SHX1336

## 2011-10-16 HISTORY — DX: Essential (primary) hypertension: I10

## 2011-10-16 LAB — POCT HEMOGLOBIN-HEMACUE: Hemoglobin: 12 g/dL — ABNORMAL LOW (ref 13.0–17.0)

## 2011-10-16 SURGERY — CHEILECTOMY
Anesthesia: General | Site: Toe | Laterality: Right | Wound class: Clean

## 2011-10-16 MED ORDER — METHOCARBAMOL 500 MG PO TABS: 500.0000 mg | ORAL_TABLET | Freq: Three times a day (TID) | ORAL | Status: AC

## 2011-10-16 MED ORDER — ONDANSETRON HCL 4 MG/2ML IJ SOLN
INTRAMUSCULAR | Status: DC | PRN
  Administered 2011-10-16: 4 mg via INTRAVENOUS

## 2011-10-16 MED ORDER — MIDAZOLAM HCL 5 MG/5ML IJ SOLN
INTRAMUSCULAR | Status: DC | PRN
  Administered 2011-10-16: 1 mg via INTRAVENOUS

## 2011-10-16 MED ORDER — LACTATED RINGERS IV SOLN
INTRAVENOUS | Status: DC
  Administered 2011-10-16 (×3): via INTRAVENOUS

## 2011-10-16 MED ORDER — CEFAZOLIN SODIUM 1-5 GM-% IV SOLN
1.0000 g | INTRAVENOUS | Status: AC
  Administered 2011-10-16: 1 g via INTRAVENOUS

## 2011-10-16 MED ORDER — PROPOFOL 10 MG/ML IV EMUL
INTRAVENOUS | Status: DC | PRN
  Administered 2011-10-16: 160 mg via INTRAVENOUS

## 2011-10-16 MED ORDER — SODIUM CHLORIDE 0.9 % IV SOLN: INTRAVENOUS | Status: DC

## 2011-10-16 MED ORDER — LIDOCAINE HCL (CARDIAC) 20 MG/ML IV SOLN
INTRAVENOUS | Status: DC | PRN
  Administered 2011-10-16: 30 mg via INTRAVENOUS

## 2011-10-16 MED ORDER — CHLORHEXIDINE GLUCONATE 4 % EX LIQD: 60.0000 mL | Freq: Once | CUTANEOUS | Status: DC

## 2011-10-16 MED ORDER — MIDAZOLAM HCL 5 MG/ML IJ SOLN: 2.0000 mg | Freq: Once | INTRAMUSCULAR | Status: DC

## 2011-10-16 MED ORDER — HYDROCODONE-ACETAMINOPHEN 5-325 MG PO TABS: 1.0000 | ORAL_TABLET | Freq: Four times a day (QID) | ORAL | Status: AC | PRN

## 2011-10-16 MED ORDER — ROPIVACAINE HCL 5 MG/ML IJ SOLN
INTRAMUSCULAR | Status: DC | PRN
  Administered 2011-10-16: 30 mL via EPIDURAL

## 2011-10-16 MED ORDER — FENTANYL CITRATE 0.05 MG/ML IJ SOLN
INTRAMUSCULAR | Status: DC | PRN
  Administered 2011-10-16: 25 ug via INTRAVENOUS

## 2011-10-16 MED ORDER — FENTANYL CITRATE 0.05 MG/ML IJ SOLN
100.0000 ug | Freq: Once | INTRAMUSCULAR | Status: AC
  Administered 2011-10-16: 100 ug via INTRAVENOUS

## 2011-10-16 MED ORDER — VITAMIN C 500 MG PO TABS: 500.0000 mg | ORAL_TABLET | Freq: Every day | ORAL | Status: AC

## 2011-10-16 MED ORDER — EPHEDRINE SULFATE 50 MG/ML IJ SOLN
INTRAMUSCULAR | Status: DC | PRN
  Administered 2011-10-16: 10 mg via INTRAVENOUS

## 2011-10-16 MED ORDER — DEXAMETHASONE SODIUM PHOSPHATE 10 MG/ML IJ SOLN
INTRAMUSCULAR | Status: DC | PRN
  Administered 2011-10-16: 10 mg via INTRAVENOUS

## 2011-10-16 MED ORDER — MIDAZOLAM HCL 2 MG/2ML IJ SOLN
1.0000 mg | INTRAMUSCULAR | Status: DC | PRN
  Administered 2011-10-16: 2 mg via INTRAVENOUS

## 2011-10-16 MED ORDER — HYDROMORPHONE HCL PF 1 MG/ML IJ SOLN: 0.2500 mg | INTRAMUSCULAR | Status: DC | PRN

## 2011-10-16 SURGICAL SUPPLY — 53 items
BANDAGE ELASTIC 4 VELCRO ST LF (GAUZE/BANDAGES/DRESSINGS) ×2 IMPLANT
BANDAGE GAUZE STRT 1 STR LF (GAUZE/BANDAGES/DRESSINGS) ×1 IMPLANT
BLADE SURG 15 STRL LF DISP TIS (BLADE) ×4 IMPLANT
BLADE SURG 15 STRL SS (BLADE) ×8
BRUSH SCRUB EZ PLAIN DRY (MISCELLANEOUS) ×2 IMPLANT
CLOTH BEACON ORANGE TIMEOUT ST (SAFETY) ×2 IMPLANT
COVER TABLE BACK 60X90 (DRAPES) ×2 IMPLANT
CUFF TOURNIQUET SINGLE 34IN LL (TOURNIQUET CUFF) ×2 IMPLANT
DRAPE EXTREMITY T 121X128X90 (DRAPE) ×2 IMPLANT
DRAPE SURG 17X23 STRL (DRAPES) ×2 IMPLANT
DRSG PAD ABDOMINAL 8X10 ST (GAUZE/BANDAGES/DRESSINGS) ×2 IMPLANT
DURA STEPPER LG (CAST SUPPLIES) IMPLANT
DURA STEPPER MED (CAST SUPPLIES) IMPLANT
DURA STEPPER SML (CAST SUPPLIES) IMPLANT
ELECT REM PT RETURN 9FT ADLT (ELECTROSURGICAL) ×2
ELECTRODE REM PT RTRN 9FT ADLT (ELECTROSURGICAL) ×1 IMPLANT
GAUZE SPONGE 4X4 16PLY XRAY LF (GAUZE/BANDAGES/DRESSINGS) IMPLANT
GAUZE XEROFORM 1X8 LF (GAUZE/BANDAGES/DRESSINGS) ×2 IMPLANT
GLOVE BIO SURGEON STRL SZ 6.5 (GLOVE) ×1 IMPLANT
GLOVE BIO SURGEON STRL SZ8 (GLOVE) ×2 IMPLANT
GLOVE BIOGEL PI IND STRL 8 (GLOVE) ×2 IMPLANT
GLOVE BIOGEL PI INDICATOR 8 (GLOVE) ×2
GLOVE INDICATOR 7.0 STRL GRN (GLOVE) ×1 IMPLANT
GLOVE SURG SS PI 8.0 STRL IVOR (GLOVE) ×2 IMPLANT
GOWN BRE IMP PREV XXLGXLNG (GOWN DISPOSABLE) ×2 IMPLANT
GOWN PREVENTION PLUS XLARGE (GOWN DISPOSABLE) ×3 IMPLANT
NEEDLE HYPO 22GX1.5 SAFETY (NEEDLE) IMPLANT
NS IRRIG 1000ML POUR BTL (IV SOLUTION) ×2 IMPLANT
PACK BASIN DAY SURGERY FS (CUSTOM PROCEDURE TRAY) ×2 IMPLANT
PAD CAST 4YDX4 CTTN HI CHSV (CAST SUPPLIES) ×1 IMPLANT
PADDING CAST ABS 4INX4YD NS (CAST SUPPLIES) ×1
PADDING CAST ABS COTTON 4X4 ST (CAST SUPPLIES) ×1 IMPLANT
PADDING CAST COTTON 4X4 STRL (CAST SUPPLIES) ×2
PENCIL BUTTON HOLSTER BLD 10FT (ELECTRODE) ×1 IMPLANT
SHEET MEDIUM DRAPE 40X70 STRL (DRAPES) ×4 IMPLANT
SPONGE GAUZE 4X4 12PLY (GAUZE/BANDAGES/DRESSINGS) ×2 IMPLANT
STOCKINETTE 6  STRL (DRAPES) ×1
STOCKINETTE 6 STRL (DRAPES) ×1 IMPLANT
SUCTION FRAZIER TIP 10 FR DISP (SUCTIONS) IMPLANT
SUT BONE WAX W31G (SUTURE) ×2 IMPLANT
SUT ETHILON 4 0 PS 2 18 (SUTURE) ×2 IMPLANT
SUT MON AB 4-0 PC3 18 (SUTURE) IMPLANT
SUT VIC AB 2-0 PS2 27 (SUTURE) IMPLANT
SUT VIC AB 3-0 PS1 18 (SUTURE) ×2
SUT VIC AB 3-0 PS1 18XBRD (SUTURE) ×1 IMPLANT
SYR 20CC LL (SYRINGE) IMPLANT
SYR BULB 3OZ (MISCELLANEOUS) ×2 IMPLANT
SYR CONTROL 10ML LL (SYRINGE) IMPLANT
TOWEL OR 17X24 6PK STRL BLUE (TOWEL DISPOSABLE) ×5 IMPLANT
TOWEL OR NON WOVEN STRL DISP B (DISPOSABLE) ×2 IMPLANT
TUBE CONNECTING 20X1/4 (TUBING) ×4 IMPLANT
UNDERPAD 30X30 INCONTINENT (UNDERPADS AND DIAPERS) ×2 IMPLANT
WATER STERILE IRR 1000ML POUR (IV SOLUTION) ×2 IMPLANT

## 2011-10-16 NOTE — Anesthesia Preprocedure Evaluation (Signed)
Anesthesia Evaluation  Patient identified by MRN, date of birth, ID band Patient awake    Reviewed: Allergy & Precautions, H&P , NPO status , Patient's Chart, lab work & pertinent test results  Airway Mallampati: I TM Distance: >3 FB Neck ROM: Full    Dental No notable dental hx. (+) Teeth Intact and Dental Advisory Given   Pulmonary shortness of breath and with exertion, asthma ,  breath sounds clear to auscultation  Pulmonary exam normal       Cardiovascular hypertension, On Medications + CAD and + Past MI Rhythm:Regular Rate:Normal     Neuro/Psych  Headaches, PSYCHIATRIC DISORDERS    GI/Hepatic Neg liver ROS, GERD-  Medicated and Controlled,  Endo/Other  negative endocrine ROS  Renal/GU negative Renal ROS  negative genitourinary   Musculoskeletal   Abdominal   Peds  Hematology negative hematology ROS (+)   Anesthesia Other Findings   Reproductive/Obstetrics negative OB ROS                           Anesthesia Physical Anesthesia Plan  ASA: III  Anesthesia Plan: General   Post-op Pain Management:    Induction: Intravenous  Airway Management Planned: LMA  Additional Equipment:   Intra-op Plan:   Post-operative Plan: Extubation in OR  Informed Consent: I have reviewed the patients History and Physical, chart, labs and discussed the procedure including the risks, benefits and alternatives for the proposed anesthesia with the patient or authorized representative who has indicated his/her understanding and acceptance.   Dental advisory given  Plan Discussed with: CRNA and Surgeon  Anesthesia Plan Comments:         Anesthesia Quick Evaluation

## 2011-10-16 NOTE — H&P (Signed)
  H&P documentation: Placed to be scanned history and physical exam in chart.  -History and Physical Reviewed  -Patient has been re-examined  -No change in the plan of care  [name] A  

## 2011-10-16 NOTE — Transfer of Care (Signed)
Immediate Anesthesia Transfer of Care Note  Patient: [name]  Procedure(s) Performed: Procedure(s) (LRB): CHEILECTOMY (Right)  Patient Location: PACU  Anesthesia Type: General and Regional  Level of Consciousness: awake, alert , oriented and patient cooperative  Airway & Oxygen Therapy: Patient Spontanous Breathing and Patient connected to face mask oxygen  Post-op Assessment: Report given to PACU RN and Post -op Vital signs reviewed and stable  Post vital signs: Reviewed and stable  Complications: No apparent anesthesia complications

## 2011-10-16 NOTE — Anesthesia Procedure Notes (Addendum)
Anesthesia Regional Block:  Ankle block  Pre-Anesthetic Checklist: ,, timeout performed, Correct Patient, Correct Site, Correct Laterality, Correct Procedure, Correct Position, site marked, Risks and benefits discussed, pre-op evaluation, post-op pain management  Laterality: Right  Prep: Maximum Sterile Barrier Precautions used and chloraprep       Needles:  Injection technique: Single-shot  Needle Type: Other   (B-D)   Needle Length: 3cm  Needle Gauge: 25 and 25 G    Additional Needles: Ankle block Narrative:  Start time: 10/16/2011 9:30 AM End time: 10/16/2011 9:36 AM Injection made incrementally with aspirations every 5 mL. Anesthesiologist: Sampson Goon, MD  Additional Notes: Superficial and deep Peroneal, Posterior Tibial, Saphenous. Negative aspiration of blood prior to all injections.  Ankle block Procedure Name: LMA Insertion Date/Time: 10/16/2011 10:26 AM Performed by: Venetta Knee D Pre-anesthesia Checklist: Patient identified, Emergency Drugs available, Suction available and Patient being monitored Patient Re-evaluated:Patient Re-evaluated prior to inductionOxygen Delivery Method: Circle System Utilized Preoxygenation: Pre-oxygenation with 100% oxygen Intubation Type: IV induction Ventilation: Mask ventilation without difficulty LMA: LMA inserted LMA Size: 5.0 Number of attempts: 1 Placement Confirmation: positive ETCO2 Tube secured with: Tape Dental Injury: Teeth and Oropharynx as per pre-operative assessment

## 2011-10-16 NOTE — Progress Notes (Signed)
Assisted Dr. Fitzgerald with right, ankle block. Side rails up, monitors on throughout procedure. See vital signs in flow sheet. Tolerated Procedure well. 

## 2011-10-16 NOTE — Brief Op Note (Signed)
10/16/2011  11:16 AM  PATIENT:  [name]  66 y.o. male  PRE-OPERATIVE DIAGNOSIS:  right hallux rigidus  POST-OPERATIVE DIAGNOSIS:  right hallux rigidus  PROCEDURE:  Procedure(s) (LRB): CHEILECTOMY (Right)  SURGEON:  Surgeon(s) and Role:    * Sherri Rad, MD - Primary  PHYSICIAN ASSISTANT: Rexene Edison, PAC   ASSISTANTS: Above   ANESTHESIA:   general  EBL:  Total I/O In: 1400 [I.V.:1400] Out: -   BLOOD ADMINISTERED:none  DRAINS: none   LOCAL MEDICATIONS USED:  NONE  SPECIMEN:  No Specimen  DISPOSITION OF SPECIMEN:  N/A  COUNTS:  YES  TOURNIQUET:   Total Tourniquet Time Documented: Thigh (Right) - 26 minutes  DICTATION: .Other Dictation: Dictation Number [id number]  PLAN OF CARE: Discharge to home after PACU  PATIENT DISPOSITION:  PACU - hemodynamically stable.   Delay start of Pharmacological VTE agent (>24hrs) due to surgical blood loss or risk of bleeding: no

## 2011-10-16 NOTE — Anesthesia Postprocedure Evaluation (Signed)
  Anesthesia Post-op Note  Patient: [name]  Procedure(s) Performed: Procedure(s) (LRB): CHEILECTOMY (Right)  Patient Location: PACU  Anesthesia Type: GA combined with regional for post-op pain  Level of Consciousness: awake  Airway and Oxygen Therapy: Patient Spontanous Breathing and Patient connected to face mask oxygen  Post-op Pain: none  Post-op Assessment: Post-op Vital signs reviewed, Patient's Cardiovascular Status Stable, Respiratory Function Stable, Patent Airway and No signs of Nausea or vomiting  Post-op Vital Signs: Reviewed and stable  Complications: No apparent anesthesia complications

## 2011-10-16 NOTE — Discharge Instructions (Signed)
Resume Plavix on 10/17/11   Post Anesthesia Home Care Instructions  Activity: Get plenty of rest for the remainder of the day. A responsible adult should stay with you for 24 hours following the procedure.  For the next 24 hours, DO NOT: -Drive a car -Advertising copywriter -Drink alcoholic beverages -Take any medication unless instructed by your physician -Make any legal decisions or sign important papers.  Meals: Start with liquid foods such as gelatin or soup. Progress to regular foods as tolerated. Avoid greasy, spicy, heavy foods. If nausea and/or vomiting occur, drink only clear liquids until the nausea and/or vomiting subsides. Call your physician if vomiting continues.  Special Instructions/Symptoms: Your throat may feel dry or sore from the anesthesia or the breathing tube placed in your throat during surgery. If this causes discomfort, gargle with warm salt water. The discomfort should disappear within 24 hours.     Regional Anesthesia Blocks  1. Numbness or the inability to move the "blocked" extremity may last from 3-48 hours after placement. The length of time depends on the medication injected and your individual response to the medication. If the numbness is not going away after 48 hours, call your surgeon.  2. The extremity that is blocked will need to be protected until the numbness is gone and the  Strength has returned. Because you cannot feel it, you will need to take extra care to avoid injury. Because it may be weak, you may have difficulty moving it or using it. You may not know what position it is in without looking at it while the block is in effect.  3. For blocks in the legs and feet, returning to weight bearing and walking needs to be done carefully. You will need to wait until the numbness is entirely gone and the strength has returned. You should be able to move your leg and foot normally before you try and bear weight or walk. You will need someone to be with you  when you first try to ensure you do not fall and possibly risk injury.  4. Bruising and tenderness at the needle site are common side effects and will resolve in a few days.  5. Persistent numbness or new problems with movement should be communicated to the surgeon or the [facility] [phone] [facility] [phone]).    Discharge Instructions After Orthopedic Procedures:  *You may feel tired and weak following your procedure. It is recommended that you limit physical activity for the next 24 hours and rest at home for the remainder of today and tomorrow. *No strenuous activity should be started without your doctor's permission.  Elevate the extremity that you had surgery on to a level above your heart. This should continue for 48 hours or as instructed by your doctor.  If you had hand, arm or shoulder surgery you should move your fingers frequently unless otherwise instructed by your doctor.  If you had foot, knee or leg surgery you should wiggle your toes frequently unless otherwise instructed by your doctor.  Follow your doctor's exact instructions for activity at home. Use your home equipment as instructed. (Crutches, hard shoes, slings etc.)  Limit your activity as instructed by your doctor.  Report to your doctor should any of the following occur: 1. Extreme swelling of your fingers or toes. 2. Inability to wiggle your fingers or toes. 3. Coldness, pale or bluish color in your fingers or toes. 4. Loss of sensation, numbness or tingling of your fingers or  toes. 5. Unusual smell or odor from under your dressing or cast. 6. Excessive bleeding or drainage from the surgical site. 7. Pain not relieved by medication your doctor has prescribed for you. 8. Cast or dressing too tight (do not get your dressing or cast wet or put anything under          your dressing or cast.)  *Do not change your dressing unless instructed by your doctor or discharge  nurse. Then follow exact instructions.  *Follow labeled instructions for any medications that your doctor may have prescribed for you. *Should any questions or complications develop following your procedure, PLEASE CONTACT YOUR DOCTOR.

## 2011-10-17 NOTE — Op Note (Signed)
NAME:  [name], [name] NO.:  [id number]  MEDICAL RECORD NO.:  [id number]  LOCATION:                                 FACILITY:  PHYSICIAN:  Leonides Grills, M.D.          DATE OF BIRTH:  DATE OF PROCEDURE:  10/16/2011 DATE OF DISCHARGE:                              OPERATIVE REPORT   PREOPERATIVE DIAGNOSIS:  Right hallux rigidus.  POSTOPERATIVE DIAGNOSIS:  Right hallux rigidus.  OPERATION:  Right great toe cheilectomy.  ANESTHESIA:  General.  SURGEON:  Leonides Grills, M.D.  ASSISTANT:  Richardean Canal, P.A.  ESTIMATED BLOOD LOSS:  Minimal.  TOURNIQUET TIME:  Approximately 40 minutes.  COMPLICATIONS:  None.  DISPOSITION:  Stable to PR.  INDICATION:  This is a 66 year old gentleman, who has had long-standing dorsal right great toe pain that was interfering with his life.  He was consented for the above procedure.  All risks, infection, vessel injury, persistent pain, worsening pain, prolonged recovery, stiffness, arthritis, wound healing problems, DVT, PE, and possibility of future fusion were all explained.  Questions were encouraged and answered.  The patient was brought to the operating room, placed in supine position.  After adequate general anesthesia administered as well as Ancef 1 g IV piggyback, right lower extremity was then prepped and draped in the sterile manner over a proximally placed thigh tourniquet. Limb was gravity exsanguinated and tourniquet was elevated to 200 mmHg. A longitudinal incision over the dorsomedial aspect of the right great toe to the MTP joint was then made.  Dissection was carried down through skin.  Hemostasis was obtained.  Careful dissection was carried down medial to the EHL tendon.  EHL tendon was protected within its tenosynovial sheaths and never violated, longitudinal capsulotomy was then made.  The joint was then entered.  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  There were spurs in both the medial and lateral gutters. With a sagittal saw, the dorsal aspect of the joint was then removed. Once this removed, there were cystic changes throughout the head.  The area and joint were copiously irrigated with normal saline.  We then used the McGlamry to free up the scarred in sesamoids as well to improve the plantar flexion and dorsiflexion.  Once this was done, we then copiously irrigated the area one more time, and then we applied bone wax to the exposed bone surfaces.  We also removed the spur off the dorsal aspect of the proximal phalanx as well.  Range of motion at the end of procedure was good.  The joint was very arthritic, and there was even thought of fusion to be done; however, due to the fact that we discussed cheilectomy extensively that and the patient wanted them to do a cheilectomy to try to preserve as much bone as possible as long as he could.  We elected to proceed with the above procedure.  The area was copiously irrigated with normal saline.  Capsules closed with 3-0 Vicryl stitch.  The area was copiously irrigated with normal saline.  Skin was closed with 4-0 nylon stitch.  Sterile dressing was applied.  Hard sole  shoe was applied.  The patient was stable to PR.     Leonides Grills, M.D.     PB/MEDQ  D:  10/16/2011  T:  10/16/2011  Job:  [id number]

## 2011-10-18 ENCOUNTER — Encounter (HOSPITAL_BASED_OUTPATIENT_CLINIC_OR_DEPARTMENT_OTHER): Payer: Self-pay | Admitting: Orthopedic Surgery

## 2011-10-24 ENCOUNTER — Ambulatory Visit (INDEPENDENT_AMBULATORY_CARE_PROVIDER_SITE_OTHER): Payer: Medicare Other

## 2011-10-24 DIAGNOSIS — J309 Allergic rhinitis, unspecified: Secondary | ICD-10-CM

## 2011-10-29 ENCOUNTER — Telehealth: Payer: Self-pay | Admitting: Internal Medicine

## 2011-10-29 MED ORDER — METHYLPHENIDATE HCL ER (OSM) 54 MG PO TBCR: 54.0000 mg | EXTENDED_RELEASE_TABLET | Freq: Every day | ORAL | Status: DC

## 2011-10-29 NOTE — Telephone Encounter (Signed)
Concerta renewed 

## 2011-10-31 ENCOUNTER — Ambulatory Visit (INDEPENDENT_AMBULATORY_CARE_PROVIDER_SITE_OTHER): Payer: Medicare Other

## 2011-10-31 DIAGNOSIS — J309 Allergic rhinitis, unspecified: Secondary | ICD-10-CM

## 2011-11-06 ENCOUNTER — Other Ambulatory Visit: Payer: Self-pay | Admitting: Family Medicine

## 2011-11-06 NOTE — Telephone Encounter (Signed)
Is this ok?

## 2011-11-08 ENCOUNTER — Ambulatory Visit (INDEPENDENT_AMBULATORY_CARE_PROVIDER_SITE_OTHER): Payer: Medicare Other

## 2011-11-08 DIAGNOSIS — J309 Allergic rhinitis, unspecified: Secondary | ICD-10-CM

## 2011-11-11 ENCOUNTER — Telehealth: Payer: Self-pay | Admitting: Internal Medicine

## 2011-11-11 MED ORDER — AZITHROMYCIN 250 MG PO TABS: ORAL_TABLET | ORAL | Status: DC

## 2011-11-11 NOTE — Telephone Encounter (Signed)
Spoke with pt. He c/o increased SOB, chest tightness, and non prod cough x 4 days. Pt denies any CP, wheeze, f/c/s. Would like something called to [facility] [address]  No Known Allergies

## 2011-11-11 NOTE — Telephone Encounter (Signed)
Per CY-okay to give patient Zpak #1 take as directed no refills and have patient use Mucinex DM OTC as directed on package. Pt aware RX sent to pharmacy and to get OTC Mucinex DM.

## 2011-11-15 ENCOUNTER — Ambulatory Visit (INDEPENDENT_AMBULATORY_CARE_PROVIDER_SITE_OTHER): Payer: Medicare Other

## 2011-11-15 DIAGNOSIS — J309 Allergic rhinitis, unspecified: Secondary | ICD-10-CM

## 2011-11-19 ENCOUNTER — Telehealth: Payer: Self-pay | Admitting: Internal Medicine

## 2011-11-19 MED ORDER — CLARITHROMYCIN 500 MG PO TABS: 500.0000 mg | ORAL_TABLET | Freq: Two times a day (BID) | ORAL | Status: DC

## 2011-11-19 MED ORDER — PREDNISONE 5 MG PO TABS: 5.0000 mg | ORAL_TABLET | Freq: Every day | ORAL | Status: DC

## 2011-11-19 NOTE — Telephone Encounter (Signed)
Spoke with patient went over directions for rx's verbally understood,  [facility] [address]

## 2011-11-19 NOTE — Telephone Encounter (Signed)
Per CY-if green/infection we can send Biaxin 500 mg #14 tale 1 po bid no refills; recommend Mucinex OTC. If not green/infection offer Prednisone 20 mg #5 take 1 po qd no refills.

## 2011-11-19 NOTE — Telephone Encounter (Signed)
Called, spoke with pt.  We sent in zpak on 11/11/11.  States he finished this on Sunday but still having a lot of chest congestion, coughing a lot - nonprod, raspy voice, and some increased SOB mainly with exertion.  Feels voice is worse but other symptoms are unchanged.  Taking benzonatate with no relief and mucinex dm bid.  Requesting further recs.  Dr. Maple Hudson, pls advise.  Thank you.    Last OV with Dr. Maple Hudson 06/20/11, pending OV December 18, 2011  [facility] [address]  nkda - verified

## 2011-11-21 ENCOUNTER — Ambulatory Visit (INDEPENDENT_AMBULATORY_CARE_PROVIDER_SITE_OTHER): Payer: Medicare Other

## 2011-11-21 DIAGNOSIS — J309 Allergic rhinitis, unspecified: Secondary | ICD-10-CM

## 2011-11-27 ENCOUNTER — Telehealth: Payer: Self-pay | Admitting: Family Medicine

## 2011-11-27 MED ORDER — METHYLPHENIDATE HCL ER (OSM) 54 MG PO TBCR: 54.0000 mg | EXTENDED_RELEASE_TABLET | Freq: Every day | ORAL | Status: DC

## 2011-11-27 NOTE — Telephone Encounter (Signed)
Have him schedule a med check appointment with me

## 2011-11-27 NOTE — Telephone Encounter (Signed)
Pt is schedule tomorrow for med check

## 2011-11-27 NOTE — Telephone Encounter (Signed)
Needs a follow up appt.

## 2011-11-27 NOTE — Telephone Encounter (Signed)
LM

## 2011-11-28 ENCOUNTER — Encounter: Payer: Self-pay | Admitting: Family Medicine

## 2011-11-28 ENCOUNTER — Ambulatory Visit (INDEPENDENT_AMBULATORY_CARE_PROVIDER_SITE_OTHER): Payer: Medicare Other

## 2011-11-28 ENCOUNTER — Ambulatory Visit (INDEPENDENT_AMBULATORY_CARE_PROVIDER_SITE_OTHER): Payer: Medicare Other | Admitting: Family Medicine

## 2011-11-28 VITALS — BP 120/88 | HR 93 | Wt 170.0 lb

## 2011-11-28 DIAGNOSIS — F909 Attention-deficit hyperactivity disorder, unspecified type: Secondary | ICD-10-CM

## 2011-11-28 DIAGNOSIS — E785 Hyperlipidemia, unspecified: Secondary | ICD-10-CM

## 2011-11-28 DIAGNOSIS — F341 Dysthymic disorder: Secondary | ICD-10-CM

## 2011-11-28 DIAGNOSIS — J309 Allergic rhinitis, unspecified: Secondary | ICD-10-CM

## 2011-11-28 DIAGNOSIS — K219 Gastro-esophageal reflux disease without esophagitis: Secondary | ICD-10-CM

## 2011-11-28 NOTE — Progress Notes (Signed)
  Subjective:    Patient ID: [name], male    DOB: Jul 28, 1945, 66 y.o.   MRN: [id number]  HPI He is here for medication check. He recently had his Concerta renewed and is doing quite well on this medication. It lasts several hours and has very little difficulty coming off of it. He continues to be followed by pulmonary and presently is being treated for a bronchitis. He continues in counseling with Evette Cristal. He continues to have reflux symptoms and presently is on a multiple drug regimen to help this. He is being followed by Dr. Matthias Hughs for this.   Review of Systems     Objective:   Physical Exam Alert and in no distress otherwise not examined       Assessment & Plan:   1. ATTENTION DEFICIT HYPERACTIVITY DISORDER, ADULT   2. GERD   3. Dysthymia   4. Hyperlipidemia LDL goal <70    he will continue on his present medication regimen. Over 30 minutes spent most of it discussing the stress he is under and how he is handling his relationship with his daughter and how this is affecting his marriage. Strongly encouraged him and his wife to get involved in counseling to learn how to handle the daughter's situation. She apparently has chronic fatigue syndrome and relies on them quite heavily

## 2011-12-02 ENCOUNTER — Ambulatory Visit (INDEPENDENT_AMBULATORY_CARE_PROVIDER_SITE_OTHER): Payer: Medicare Other

## 2011-12-02 DIAGNOSIS — J309 Allergic rhinitis, unspecified: Secondary | ICD-10-CM

## 2011-12-03 ENCOUNTER — Ambulatory Visit (INDEPENDENT_AMBULATORY_CARE_PROVIDER_SITE_OTHER): Payer: Medicare Other | Admitting: Internal Medicine

## 2011-12-03 ENCOUNTER — Encounter: Payer: Self-pay | Admitting: Internal Medicine

## 2011-12-03 VITALS — BP 130/80 | HR 98 | Ht 70.0 in | Wt 169.4 lb

## 2011-12-03 DIAGNOSIS — J40 Bronchitis, not specified as acute or chronic: Secondary | ICD-10-CM

## 2011-12-03 MED ORDER — HYDROCODONE-HOMATROPINE 5-1.5 MG/5ML PO SYRP: ORAL_SOLUTION | ORAL | Status: DC

## 2011-12-03 MED ORDER — FIRST-DUKES MOUTHWASH MT SUSP: OROMUCOSAL | Status: AC

## 2011-12-03 NOTE — Patient Instructions (Addendum)
  Scripts for cough syrup and for Magic Mouthwash  Try to rest your throat- throat lozenges and sips of liquids

## 2011-12-03 NOTE — Progress Notes (Signed)
Patient ID: [name], male    DOB: Jun 11, 1945, 66 y.o.   MRN: [id number]  HPI 12/18/10- 66 yo M followed for asthma, allergic rhinitis complicated by hx ADHD, CAD/ MI, GERD Last here June 21, 2010  Continues allergy vaccine 1:2 GH. In last 2-3 weeks, if he rushes or gets tense, he coughs and gets chest pains, especially going in and out of heat. Describes the pains as an airway soreness with some chest muscle soreness with increased raspy cough. Wife notes his raspy throat clearing. Mostly chest and throat, with persistent, "controlled" nasal drainage. He does garden and maintain yard. Has trouble remembering Symbicort.   [birth year]64 yo M never smoker followed for asthma, allergic rhinitis complicated by hx ADHD, CAD/ MI, GERD Had flu vaccine. He notices what feel like chest wall pains while lying in bed, in cold air or after exertion. He tends to cough for about 15 minutes after he first lies down, nonproductive and without awareness of reflux or postnasal drip. Aware of bruxism during the daytime which he will discuss with his dentist.  12/03/11- 66 yo M never smoker followed for asthma, allergic rhinitis complicated by hx ADHD, CAD/ MI, GERD Hoarseness, cough-non productive x 1.5 weeks; also has chest tightness. Uncomfortable singing so as stayed out of choir practice for the past 2 weeks feeling course with some dry cough and chest tightness. Illness began with a headache. Had taken a Z-Pak and Mucinex DM and Biaxin. Benzonatate may have helped a little. He has now finished a prednisone taper. No purulent sputum, bad sore throat or fever. Wife has remained well with no obvious viral syndrome in the family. CXR 10/14/11- stable, negative except mild cardiac enlargement. Continues allergy vaccine 1: 2, GH.  Review of Systems-see HPI Constitutional:   No-   weight loss, night sweats, fevers, chills, fatigue, lassitude. HEENT:   No-current  headaches, difficulty swallowing, tooth/dental  problems, sore throat,       No-  sneezing, itching, ear ache, +nasal congestion, post nasal drip,  CV:  No-chest pain, No- orthopnea, PND, swelling in lower extremities, anasarca, dizziness, palpitations Resp: No-   shortness of breath with exertion or at rest.              No-   productive cough,  + non-productive cough,  No- coughing up of blood.              No-   change in color of mucus.  No- wheezing.   Skin: No-   rash or lesions. GI:  No-   heartburn, indigestion, abdominal pain, nausea, vomiting,  GU:  MS:  No-   joint pain or swelling. Neuro-     nothing unusual Psych:  No- change in mood or affect. No depression or anxiety.  No memory loss.     Objective:   Physical Exam BP 130/80  Pulse 98  Ht 5\' 10"  (1.778 m)  Wt 169 lb 6.4 oz (76.839 kg)  BMI 24.31 kg/m2  SpO2 97%  General- Alert, Oriented, Affect-appropriate, Distress- none acute Skin- rash-none, lesions- none, excoriation- none Lymphadenopathy- none Head- atraumatic            Eyes- Gross vision intact, PERRLA, conjunctivae clear secretions            Ears- Hearing, canals-normal            Nose- Clear, no-Septal dev, mucus, polyps, erosion, perforation  Throat- Mallampati II , mucosa a little red/? Early thrush , drainage- none, tonsils- atrophic. Frequent throat clearing Neck- flexible , trachea midline, no stridor , thyroid nl, carotid no bruit Chest - symmetrical excursion , unlabored           Heart/CV- RRR , no murmur , no gallop  , no rub, nl s1 s2                           - JVD- none , edema- none, stasis changes- none, varices- none           Lung- clear to P&A, wheeze- none, cough- none , dullness-none, rub- none           Chest wall-  Abd- Br/ Gen/ Rectal- Not done, not indicated Extrem- cyanosis- none, clubbing, none, atrophy- none, strength- nl Neuro- grossly intact to observation

## 2011-12-06 ENCOUNTER — Ambulatory Visit (INDEPENDENT_AMBULATORY_CARE_PROVIDER_SITE_OTHER): Payer: Medicare Other

## 2011-12-06 DIAGNOSIS — J309 Allergic rhinitis, unspecified: Secondary | ICD-10-CM

## 2011-12-08 NOTE — Assessment & Plan Note (Signed)
After antibiotics and prednisone it looks as if he may be getting a little thrush. This may explain his need to clear his throat and some of his persistent symptoms. We discussed avoidance of throat clearing including some cough suppression. Plan-Magic mouthwash

## 2011-12-09 ENCOUNTER — Encounter: Payer: Self-pay | Admitting: Family Medicine

## 2011-12-09 ENCOUNTER — Ambulatory Visit (INDEPENDENT_AMBULATORY_CARE_PROVIDER_SITE_OTHER): Payer: Medicare Other | Admitting: Family Medicine

## 2011-12-09 VITALS — BP 120/70 | HR 103 | Wt 173.0 lb

## 2011-12-09 DIAGNOSIS — E559 Vitamin D deficiency, unspecified: Secondary | ICD-10-CM

## 2011-12-09 DIAGNOSIS — R238 Other skin changes: Secondary | ICD-10-CM

## 2011-12-09 DIAGNOSIS — R490 Dysphonia: Secondary | ICD-10-CM

## 2011-12-09 LAB — CBC WITH DIFFERENTIAL/PLATELET
Basophils Absolute: 0 10*3/uL (ref 0.0–0.1)
Basophils Relative: 0 % (ref 0–1)
Eosinophils Absolute: 0.1 10*3/uL (ref 0.0–0.7)
Eosinophils Relative: 1 % (ref 0–5)
HCT: 38.7 % — ABNORMAL LOW (ref 39.0–52.0)
MCH: 27.8 pg (ref 26.0–34.0)
MCHC: 33.6 g/dL (ref 30.0–36.0)
MCV: 82.9 fL (ref 78.0–100.0)
Monocytes Absolute: 0.6 10*3/uL (ref 0.1–1.0)
RDW: 19.6 % — ABNORMAL HIGH (ref 11.5–15.5)

## 2011-12-09 NOTE — Progress Notes (Signed)
  Subjective:    Patient ID: [name], male    DOB: 1945-06-13, 66 y.o.   MRN: [id number]  HPI He has a 4 week history of intermittent hoarse voice, chest tightness him a dry cough. He was placed on 2 different antibiotics by Dr. Maple Hudson and most recently was given Magic mouthwash and a cough suppressant. He continues to have trouble. He also continues on Nexium during the day and Zegerid at night. He sometimes uses Carafate for breakthrough reflux symptoms. He also complains of easy bruisability. He is on Plavix. He's had an occasional nosebleed but none recently. No blood in his stool or urine. He was also told that he had a vitamin deficiency as well as low iron by Dr. Matthias Hughs.   Review of Systems     Objective:   Physical Exam alert and in no distress. Tympanic membranes and canals are normal. Throat is clear. Tonsils are normal. Neck is supple without adenopathy or thyromegaly. Cardiac exam shows a regular sinus rhythm without murmurs or gallops. Lungs are clear to auscultation. Exam of the skin does show multiple ecchymotic areas. Lower extremity has no lesions. No bleeding from his gums or nose were noted.       Assessment & Plan:   1. Hoarse voice quality  Ambulatory referral to ENT  2. Easy bruisability  CBC with Differential  3. Vitamin D insufficiency  Vitamin D 25 hydroxy

## 2011-12-13 ENCOUNTER — Ambulatory Visit (INDEPENDENT_AMBULATORY_CARE_PROVIDER_SITE_OTHER): Payer: Medicare Other

## 2011-12-13 DIAGNOSIS — J309 Allergic rhinitis, unspecified: Secondary | ICD-10-CM

## 2011-12-16 ENCOUNTER — Emergency Department (HOSPITAL_COMMUNITY)
Admission: EM | Admit: 2011-12-16 | Discharge: 2011-12-17 | Disposition: A | Discharge status: Home / Self Care | Payer: Medicare Other | Attending: Emergency Medicine | Admitting: Emergency Medicine

## 2011-12-16 ENCOUNTER — Encounter (HOSPITAL_COMMUNITY): Payer: Self-pay | Admitting: *Deleted

## 2011-12-16 DIAGNOSIS — R0602 Shortness of breath: Secondary | ICD-10-CM | POA: Insufficient documentation

## 2011-12-16 DIAGNOSIS — E785 Hyperlipidemia, unspecified: Secondary | ICD-10-CM | POA: Insufficient documentation

## 2011-12-16 DIAGNOSIS — I252 Old myocardial infarction: Secondary | ICD-10-CM | POA: Insufficient documentation

## 2011-12-16 DIAGNOSIS — Z79899 Other long term (current) drug therapy: Secondary | ICD-10-CM | POA: Insufficient documentation

## 2011-12-16 DIAGNOSIS — I10 Essential (primary) hypertension: Secondary | ICD-10-CM | POA: Insufficient documentation

## 2011-12-16 DIAGNOSIS — K219 Gastro-esophageal reflux disease without esophagitis: Secondary | ICD-10-CM | POA: Insufficient documentation

## 2011-12-16 DIAGNOSIS — J9801 Acute bronchospasm: Secondary | ICD-10-CM

## 2011-12-16 DIAGNOSIS — J45909 Unspecified asthma, uncomplicated: Secondary | ICD-10-CM | POA: Insufficient documentation

## 2011-12-16 DIAGNOSIS — F909 Attention-deficit hyperactivity disorder, unspecified type: Secondary | ICD-10-CM | POA: Insufficient documentation

## 2011-12-16 DIAGNOSIS — I2510 Atherosclerotic heart disease of native coronary artery without angina pectoris: Secondary | ICD-10-CM | POA: Insufficient documentation

## 2011-12-16 NOTE — ED Notes (Signed)
Pt c/o trouble breathing; cough/hoarseness x 5 wks; saw pcp for same last wk x 2; appt thurs with ENT.  Tonight didn't feel like he was getting enough air.  Jittery.  C/o feeling tight in his chest

## 2011-12-16 NOTE — ED Notes (Signed)
Pt c/o swelling/stinging right middle finger middle knuckle; noted "pimple" yesterday

## 2011-12-17 ENCOUNTER — Emergency Department (HOSPITAL_COMMUNITY): Payer: Medicare Other

## 2011-12-17 LAB — BASIC METABOLIC PANEL
CO2: 21 mEq/L (ref 19–32)
Calcium: 8.9 mg/dL (ref 8.4–10.5)
Creatinine, Ser: 0.92 mg/dL (ref 0.50–1.35)

## 2011-12-17 LAB — CBC
MCH: 28.6 pg (ref 26.0–34.0)
MCV: 84.4 fL (ref 78.0–100.0)
Platelets: 168 10*3/uL (ref 150–400)
RBC: 3.91 MIL/uL — ABNORMAL LOW (ref 4.22–5.81)

## 2011-12-17 LAB — TROPONIN I: Troponin I: <0.3 ng/mL (ref <0.30)

## 2011-12-17 MED ORDER — ALBUTEROL SULFATE HFA 108 (90 BASE) MCG/ACT IN AERS
2.0000 | INHALATION_SPRAY | Freq: Once | RESPIRATORY_TRACT | Status: AC
  Administered 2011-12-17: 2 via RESPIRATORY_TRACT
  Filled 2011-12-17: qty 6.7

## 2011-12-17 NOTE — ED Provider Notes (Addendum)
History     CSN: [id number]  Arrival date & time 12/16/11  2326   First MD Initiated Contact with Patient 12/17/11 0005      Chief Complaint  Patient presents with  . Shortness of Breath    HPI The patient reports he has a history of asthma for the last several weeks his had cough and hoarseness.  He denies fevers or chills.  He reports this evening his cough became more severe and felt like he was having difficulty breathing.  He tried his albuterol which didn't seem to work immediately and thus he presents the emergency department.  Once he arrived the emergency department he felt as though his breathing was much better.  Denies chest pain at this time.  He has no unilateral leg swelling.  He has no history of pulmonary embolism or DVT.  He reports her last several weeks he is not really used his albuterol inhaler as he thought it was only there for times when he was in extremis.  Nothing worsens the symptoms.  Nothing improves his symptoms.  His symptoms are constant.   Past Medical History  Diagnosis Date  . CAD (coronary artery disease)     Dr. Donnie Aho  . Myocardial infarct   . Allergic rhinitis     skin test 02/23/08 vaccine 2009  . GERD (gastroesophageal reflux disease)   . Hyperlipidemia   . Erectile dysfunction   . Depression   . ADD (attention deficit disorder)   . Hypogonadism male   . Asthma     Dr. Jetty Duhamel  . Hypertension   . Shortness of breath     on exertion  . Anemia     takes Fe -   . Headache     precedes vertigo and nausea  . Arthritis     osteoarthritis  . Anxiety     Past Surgical History  Procedure Date  . Lumbar disc surgery   . Cardiac catheterization   . Cholecystectomy   . Tonsillectomy     1953  . Vasectomy     1980  . Right thumb surgery for arthritis     Dr. Amanda Pea  . Right index finger  mass removed- arthritis   . Colonoscopy   . Upper gastrointestinal endoscopy   . Coronary angioplasty with stent placement     1999   .  Coronary angioplasty     1995 after MI  . Cheilectomy 10/16/2011    Procedure: CHEILECTOMY;  Surgeon: Sherri Rad, MD;  Location: [facility];  Service: Orthopedics;  Laterality: Right;    Family History  Problem Relation Age of Onset  . Diabetes Mother   . Dementia Mother   . Heart disease Father   . Chronic fatigue Daughter     History  Substance Use Topics  . Smoking status: Never Smoker   . Smokeless tobacco: Never Used  . Alcohol Use: Yes     drinks a glass of wine once a month       Review of Systems  All other systems reviewed and are negative.    Allergies  Review of patient's allergies indicates no known allergies.  Home Medications   Current Outpatient Rx  Name Route Sig Dispense Refill  . ALBUTEROL SULFATE HFA 108 (90 BASE) MCG/ACT IN AERS Inhalation Inhale 2 puffs into the lungs every 4 (four) hours as needed.      . ALPRAZOLAM 0.25 MG PO TABS Oral Take 1 tablet (0.25 mg  total) by mouth 3 (three) times daily as needed for anxiety. 30 tablet 1  . ASPIRIN 81 MG PO TABS Oral Take 81 mg by mouth daily.      [name] BENZONATATE 200 MG PO CAPS Oral Take 1 capsule (200 mg total) by mouth 3 (three) times daily as needed. 50 capsule prn  . BUDESONIDE-FORMOTEROL FUMARATE 160-4.5 MCG/ACT IN AERO Inhalation Inhale 2 puffs into the lungs 2 (two) times daily. 1 Inhaler prn  . BUPROPION HCL ER (XL) 300 MG PO TB24 Oral Take 300 mg by mouth daily.      [name] CETIRIZINE HCL 10 MG PO TABS Oral Take 10 mg by mouth daily.      [name] CLOPIDOGREL BISULFATE 75 MG PO TABS Oral Take 75 mg by mouth daily.     . DESVENLAFAXINE SUCCINATE ER 50 MG PO TB24 Oral Take 50 mg by mouth daily.      [name] DILTIAZEM HCL ER COATED BEADS 240 MG PO CP24 Oral Take 240 mg by mouth daily.      [name] FIRST-DUKES MOUTHWASH MT SUSP  1 tablespoon swish and swallow, 4 times daily 150 mL 1  . ESOMEPRAZOLE MAGNESIUM 40 MG PO CPDR Oral Take 40 mg by mouth daily.      [name] EZETIMIBE-SIMVASTATIN 10-80 MG PO TABS Oral  Take 1 tablet by mouth at bedtime.      [name] FERROUS SULFATE 325 (65 FE) MG PO TABS Oral Take 325 mg by mouth daily with breakfast.    . GUAIFENESIN ER 600 MG PO TB12 Oral Take 1,200 mg by mouth daily as needed.     [name] HYDROCODONE-HOMATROPINE 5-1.5 MG/5ML PO SYRP  Take 1 tsp every 6 hours as needed for cough 200 mL 0  . MECLIZINE HCL 25 MG PO TABS  TAKE 1 TABLET BY MOUTH TWICE DAILY AS NEEDED 60 tablet 0  . METHYLPHENIDATE HCL ER 36 MG PO TBCR Oral Take 36 mg by mouth every morning.    [name] MONTELUKAST SODIUM 10 MG PO TABS  TAKE 1 TABLET DAILY 90 tablet 2  . NIACIN ER (ANTIHYPERLIPIDEMIC) 1000 MG PO TBCR Oral Take 1,500 mg by mouth daily.     [name] OMEPRAZOLE-SODIUM BICARBONATE 40-1100 MG PO CAPS Oral Take 1 capsule by mouth daily.     [name] VITAMIN C 500 MG PO TABS Oral Take 1 tablet (500 mg total) by mouth daily. 90 tablet 0  . VOLTAREN 1 % TD GEL Oral Take 1 application by mouth 4 (four) times daily. As needed    . NITROGLYCERIN 0.4 MG SL SUBL Sublingual Place 0.4 mg under the tongue every 5 (five) minutes as needed.       BP 121/76  Pulse 96  Temp 97.6 F (36.4 C)  Resp 20  SpO2 100%  Physical Exam  Nursing note and vitals reviewed. Constitutional: He is oriented to person, place, and time. He appears well-developed and well-nourished.  HENT:  Head: Normocephalic and atraumatic.  Eyes: EOM are normal.  Neck: Normal range of motion.  Cardiovascular: Normal rate, regular rhythm, normal heart sounds and intact distal pulses.   Pulmonary/Chest: Effort normal and breath sounds normal. No respiratory distress.  Abdominal: Soft. He exhibits no distension. There is no tenderness.  Musculoskeletal: Normal range of motion.  Neurological: He is alert and oriented to person, place, and time.  Skin: Skin is warm and dry.  Psychiatric: He has a normal mood and affect. Judgment normal.    ED Course  Procedures (including critical care time)  Labs Reviewed  CBC - Abnormal; Notable for the following:      RBC 3.91 (*)     Hemoglobin 11.2 (*)     HCT 33.0 (*)     RDW 18.8 (*)     All other components within normal limits  BASIC METABOLIC PANEL - Abnormal; Notable for the following:    Glucose, Bld 108 (*)     GFR calc non Af Amer 87 (*)     All other components within normal limits  TROPONIN I   Dg Chest 2 View  12/17/2011  *RADIOLOGY REPORT*  Clinical Data: Short of breath  CHEST - 2 VIEW  Comparison: 10/14/2011  Findings: Normal heart size.  Interstitial prominence and bronchitic changes.  No pneumothorax and no pleural effusion.  No mass.  No consolidation.  IMPRESSION: No active cardiopulmonary disease.  Chronic changes.  Original Report Authenticated By: Donavan Burnet, M.D.    I personally reviewed the imaging tests through PACS system  I reviewed available ER/hospitalization records thought the EMR   1. Bronchospasm       MDM  The patient's symptoms seem consistent with bronchospasm/bronchitis.  Chest x-ray is clear.  Labs are otherwise without significant abnormality.  This not a CS or pulmonary bolus him.  The patient was encouraged use his albuterol inhaler more frequently.  He will followup with his primary care physician and his pulmonologist as needed.  He does not need a dose of steroids at this time        Lyanne Co, MD 12/17/11 0204   Date: 01/20/2012  Rate: 92  Rhythm: normal sinus rhythm  QRS Axis: normal  Intervals: normal  ST/T Wave abnormalities: normal  Conduction Disutrbances: none  Narrative Interpretation:   Old EKG Reviewed: No significant changes noted     Lyanne Co, MD 01/20/12 [phone]

## 2011-12-17 NOTE — ED Notes (Signed)
Pt sts pressure in chest. Non productive cough and hoarseness x 5 weeks. Has been treated with abx without relief.

## 2011-12-17 NOTE — ED Notes (Signed)
Patient given discharge instructions, information, prescriptions, and diet order. Patient states that they adequately understand discharge information given and to return to ED if symptoms return or worsen.     

## 2011-12-18 ENCOUNTER — Ambulatory Visit: Payer: Medicare Other | Admitting: Internal Medicine

## 2011-12-23 ENCOUNTER — Ambulatory Visit (INDEPENDENT_AMBULATORY_CARE_PROVIDER_SITE_OTHER): Payer: Medicare Other

## 2011-12-23 DIAGNOSIS — J309 Allergic rhinitis, unspecified: Secondary | ICD-10-CM

## 2012-01-03 ENCOUNTER — Telehealth: Payer: Self-pay | Admitting: Internal Medicine

## 2012-01-03 ENCOUNTER — Other Ambulatory Visit: Payer: Self-pay | Admitting: Family Medicine

## 2012-01-03 ENCOUNTER — Ambulatory Visit (INDEPENDENT_AMBULATORY_CARE_PROVIDER_SITE_OTHER): Payer: Medicare Other

## 2012-01-03 DIAGNOSIS — J309 Allergic rhinitis, unspecified: Secondary | ICD-10-CM

## 2012-01-03 MED ORDER — ALBUTEROL SULFATE HFA 108 (90 BASE) MCG/ACT IN AERS: 2.0000 | INHALATION_SPRAY | RESPIRATORY_TRACT | Status: DC | PRN

## 2012-01-03 NOTE — Telephone Encounter (Signed)
Needs refill Concerta, please call when ready

## 2012-01-03 NOTE — Telephone Encounter (Signed)
Contacted patient. Patient stated needs refill on Ventolin and nothing further needed. Refill sent to pharmacy and patient aware.

## 2012-01-06 MED ORDER — METHYLPHENIDATE HCL ER (OSM) 36 MG PO TBCR: 36.0000 mg | EXTENDED_RELEASE_TABLET | ORAL | Status: DC

## 2012-01-14 ENCOUNTER — Encounter: Payer: Self-pay | Admitting: Family Medicine

## 2012-01-14 ENCOUNTER — Ambulatory Visit (INDEPENDENT_AMBULATORY_CARE_PROVIDER_SITE_OTHER): Payer: Medicare Other | Admitting: Family Medicine

## 2012-01-14 VITALS — BP 130/70 | HR 103 | Wt 172.0 lb

## 2012-01-14 DIAGNOSIS — F329 Major depressive disorder, single episode, unspecified: Secondary | ICD-10-CM

## 2012-01-14 DIAGNOSIS — K219 Gastro-esophageal reflux disease without esophagitis: Secondary | ICD-10-CM

## 2012-01-14 DIAGNOSIS — F32A Depression, unspecified: Secondary | ICD-10-CM

## 2012-01-14 DIAGNOSIS — T07XXXA Unspecified multiple injuries, initial encounter: Secondary | ICD-10-CM

## 2012-01-14 MED ORDER — DESVENLAFAXINE SUCCINATE ER 50 MG PO TB24: 50.0000 mg | ORAL_TABLET | Freq: Every day | ORAL | Status: DC

## 2012-01-14 MED ORDER — BUPROPION HCL ER (XL) 300 MG PO TB24: 300.0000 mg | ORAL_TABLET | Freq: Every day | ORAL | Status: DC

## 2012-01-14 NOTE — Progress Notes (Signed)
  Subjective:    Patient ID: [name], male    DOB: [birth year], 66 y.o.   MRN: [id number]  HPI He fell last Sunday and was seen in an urgent care Center. X-rays were negative. He thinks he tripped over something. He does remember the fall. Since then he has noted some tenderness in his left anterior chest area as well as a lesion in his left breast. He also notes discoloration in the left upper arm. He is concerned over the breast lesion. He also was recently evaluated by ENT and an endoscopy did show laryngeal inflammation. He is now being followed by GI and apparently pH monitoring will be done. He also needs refill on his antidepressant medications. He is getting these through me instead of his psychiatrist.   Review of Systems     Objective:   Physical Exam Alert and in no distress. Slight tenderness to palpation over the second left costochondral junction. Exam of the left breast does show some ecchymosis with some underlying induration. Left arm does show ecchymosis.      Assessment & Plan:   1. Depression  buPROPion (WELLBUTRIN XL) 300 MG 24 hr tablet, desvenlafaxine (PRISTIQ) 50 MG 24 hr tablet  2. Multiple contusions    3. GERD (gastroesophageal reflux disease)     I reassured him that the lesion in his breast is nothing to be concerned about and is most likely from the recent trauma. Told him to [place] for least 2 months and if they're still difficulty, call me. I will renew his Wellbutrin and Pristiq

## 2012-01-16 ENCOUNTER — Telehealth: Payer: Self-pay | Admitting: Family Medicine

## 2012-01-16 MED ORDER — METHYLPHENIDATE HCL ER (OSM) 54 MG PO TBCR: 54.0000 mg | EXTENDED_RELEASE_TABLET | ORAL | Status: DC

## 2012-01-16 NOTE — Telephone Encounter (Signed)
LEFT PT MESSAGE THAT PRESCRIPTION WAS IN ERROR I TOLD HIM A NEW RX WAS WAITING ON HIM THIS IS WHAT I GOT FOR JCL NOTE

## 2012-01-16 NOTE — Telephone Encounter (Signed)
Concerta changed to 54 which was what he was on before.

## 2012-01-16 NOTE — Telephone Encounter (Signed)
Let him know that are under prescription for 54. It was a dosing error on my part

## 2012-01-17 ENCOUNTER — Telehealth: Payer: Self-pay | Admitting: Family Medicine

## 2012-01-17 NOTE — Telephone Encounter (Signed)
LM

## 2012-01-21 ENCOUNTER — Ambulatory Visit (INDEPENDENT_AMBULATORY_CARE_PROVIDER_SITE_OTHER): Payer: Medicare Other

## 2012-01-21 ENCOUNTER — Encounter: Payer: Self-pay | Admitting: Internal Medicine

## 2012-01-21 DIAGNOSIS — J309 Allergic rhinitis, unspecified: Secondary | ICD-10-CM

## 2012-01-28 ENCOUNTER — Ambulatory Visit (INDEPENDENT_AMBULATORY_CARE_PROVIDER_SITE_OTHER): Payer: Medicare Other

## 2012-01-28 DIAGNOSIS — J309 Allergic rhinitis, unspecified: Secondary | ICD-10-CM

## 2012-02-11 ENCOUNTER — Ambulatory Visit (INDEPENDENT_AMBULATORY_CARE_PROVIDER_SITE_OTHER): Payer: Medicare Other

## 2012-02-11 DIAGNOSIS — J309 Allergic rhinitis, unspecified: Secondary | ICD-10-CM

## 2012-02-14 ENCOUNTER — Telehealth: Payer: Self-pay | Admitting: Family Medicine

## 2012-02-14 MED ORDER — METHYLPHENIDATE HCL ER (OSM) 54 MG PO TBCR: 54.0000 mg | EXTENDED_RELEASE_TABLET | ORAL | Status: DC

## 2012-02-14 NOTE — Telephone Encounter (Signed)
Concerta renewed for 3 months 

## 2012-02-18 ENCOUNTER — Encounter (HOSPITAL_COMMUNITY): Payer: Self-pay | Admitting: *Deleted

## 2012-02-18 ENCOUNTER — Ambulatory Visit (HOSPITAL_COMMUNITY)
Admission: RE | Admit: 2012-02-18 | Discharge: 2012-02-18 | Disposition: A | Discharge status: Home / Self Care | Payer: Medicare Other | Source: Ambulatory Visit | Attending: Gastroenterology | Admitting: Gastroenterology

## 2012-02-18 ENCOUNTER — Encounter (HOSPITAL_COMMUNITY)
Admission: RE | Disposition: A | Discharge status: Home / Self Care | Payer: Self-pay | Source: Ambulatory Visit | Attending: Gastroenterology

## 2012-02-18 DIAGNOSIS — I252 Old myocardial infarction: Secondary | ICD-10-CM | POA: Insufficient documentation

## 2012-02-18 DIAGNOSIS — R490 Dysphonia: Secondary | ICD-10-CM | POA: Insufficient documentation

## 2012-02-18 DIAGNOSIS — J45909 Unspecified asthma, uncomplicated: Secondary | ICD-10-CM | POA: Insufficient documentation

## 2012-02-18 DIAGNOSIS — Z79899 Other long term (current) drug therapy: Secondary | ICD-10-CM | POA: Insufficient documentation

## 2012-02-18 DIAGNOSIS — I10 Essential (primary) hypertension: Secondary | ICD-10-CM | POA: Insufficient documentation

## 2012-02-18 DIAGNOSIS — Z7982 Long term (current) use of aspirin: Secondary | ICD-10-CM | POA: Insufficient documentation

## 2012-02-18 HISTORY — PX: ESOPHAGOGASTRODUODENOSCOPY: SHX5428

## 2012-02-18 HISTORY — PX: BRAVO PH STUDY: SHX5421

## 2012-02-18 SURGERY — EGD (ESOPHAGOGASTRODUODENOSCOPY)
Anesthesia: Moderate Sedation

## 2012-02-18 MED ORDER — SODIUM CHLORIDE 0.9 % IV SOLN
INTRAVENOUS | Status: DC
  Administered 2012-02-18: 500 mL via INTRAVENOUS

## 2012-02-18 MED ORDER — DIPHENHYDRAMINE HCL 50 MG/ML IJ SOLN
INTRAMUSCULAR | Status: DC | PRN
  Administered 2012-02-18: 25 mg via INTRAVENOUS

## 2012-02-18 MED ORDER — DIPHENHYDRAMINE HCL 50 MG/ML IJ SOLN
INTRAMUSCULAR | Status: AC
  Filled 2012-02-18: qty 1

## 2012-02-18 MED ORDER — FENTANYL CITRATE 0.05 MG/ML IJ SOLN
INTRAMUSCULAR | Status: DC | PRN
  Administered 2012-02-18 (×2): 25 ug via INTRAVENOUS

## 2012-02-18 MED ORDER — BUTAMBEN-TETRACAINE-BENZOCAINE 2-2-14 % EX AERO
INHALATION_SPRAY | CUTANEOUS | Status: DC | PRN
  Administered 2012-02-18: 2 via TOPICAL

## 2012-02-18 MED ORDER — MIDAZOLAM HCL 10 MG/2ML IJ SOLN
INTRAMUSCULAR | Status: DC | PRN
  Administered 2012-02-18 (×3): 2 mg via INTRAVENOUS

## 2012-02-18 MED ORDER — FENTANYL CITRATE 0.05 MG/ML IJ SOLN
INTRAMUSCULAR | Status: AC
  Filled 2012-02-18: qty 4

## 2012-02-18 MED ORDER — MIDAZOLAM HCL 10 MG/2ML IJ SOLN
INTRAMUSCULAR | Status: AC
  Filled 2012-02-18: qty 4

## 2012-02-18 NOTE — H&P (Signed)
  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  Past medical history:  Allergies: None  Outpatient medications aspirin, Plavix, Niaspan, Vytorin, Zegerid, Singulair, Pristiq, Concerta, meclizine when necessary, nitroglycerin when necessary, Astelin spray when necessary, Symbicort inhaler, Pro-air inhaler when necessary, Mucinex when necessary, flaxseed oil, calcium supplement, diltiazem, BuSpar, Carafate, Nexium  Operations angioplasty with stent, hernia repair, cholecystectomy, various orthopedic operations.  Chronic medical illnesses: Possible GERD with LPR, history of MI, asthma and allergies, hypertension, DJD  Physical exam: Healthy-appearing gentleman, no acute distress. Vitals unremarkable. No pallor or icterus. Chest clear. Heart without murmurs or arrhythmias. Abdomen without guarding, mass effect, or tenderness, soft and nondistended  Impression: Coarseness, Questionable LPR  Plan: EGD with Bravo capsule placement.  Florencia Reasons, M.D. [phone]

## 2012-02-18 NOTE — Op Note (Signed)
[facility] [address] [place], [address]   ENDOSCOPY PROCEDURE REPORT  PATIENT: [name], [name]  MR#: [id number] BIRTHDATE: [birth year] , 66  yrs. old GENDER: Male ENDOSCOPIST:Marella Vanderpol, MD REFERRED BY:  Dr. Sharlot Gowda PROCEDURE DATE:  02/18/2012 PROCEDURE:      Upper endoscopy with placement of bravo capsule ASA CLASS: INDICATIONS:   chronic hoarseness, possible LPR, but with the absence of response to high-dose long-term PPI therapy MEDICATION:    Benadryl 25 mg, fentanyl 50 mcg, Versed 6 mg IV TOPICAL ANESTHETIC:    Cetacaine  DESCRIPTION OF PROCEDURE:   the patient came as an outpatient to the [facility] long endoscopy unit.  This procedure was performed with the patient actively taking his PPI therapy, which consists of Nexium 40 mg each morning and Zegerid 40 mg each night.  Sedation went smoothly and there was no clinical instability during the course of the procedure.  The Pentax video endoscope was passed under direct vision. The vocal cords appeared slightly erythematous, but there was no frank laryngeal edema to my inspection.  The esophagus was easily entered under direct vision and was normal in its entirety. The Z line was right at 40 cm, with no evidence of any hiatal hernia.  The esophageal mucosa was normal, without evidence of reflux esophagitis, Barrett's esophagus, nor any infection, neoplasia, or varices. There was no evident stricture.  The stomach contained a small bilious residual and there was minimal prepyloric erythema but no erosive changes, nor any ulcers, polyps, or masses. A retroflexed view of the cardia was normal.  The pylorus, duodenal bulb, and second portion of the duodenum looked normal.  Bravo capsule placement was then performed successfully. The scope was removed from the patient, the Z line was measured to be at 40 cm, the introducing catheter was marked and passed blindly into the esophagus  without difficulty, confirming its location endoscopically. Suction was then applied for 45 seconds, and the deployment mechanism activated. The introducing catheter was removed, and the patient was rere endoscoped, confirming successful attachment of the capsule to the distal esophagus.  The scope was then removed and the patient tolerated the procedure well without apparent complication.     COMPLICATIONS: None  ENDOSCOPIC IMPRESSION:  1. Normal endoscopy, other than minimal bile reflux 2. Successful placement of bravo capsule for 48-hour intraesophageal pH monitoring  RECOMMENDATIONS:   Await results of pH monitoring study.   _______________________________ eSignedBernette Redbird, MD 02/18/2012 9:48 AM    PATIENT NAME:  [name], [name] MR#: [id number]

## 2012-02-19 ENCOUNTER — Encounter (HOSPITAL_COMMUNITY): Payer: Self-pay

## 2012-02-19 ENCOUNTER — Encounter (HOSPITAL_COMMUNITY): Payer: Self-pay | Admitting: Gastroenterology

## 2012-02-20 ENCOUNTER — Ambulatory Visit (INDEPENDENT_AMBULATORY_CARE_PROVIDER_SITE_OTHER): Payer: Medicare Other

## 2012-02-20 DIAGNOSIS — J309 Allergic rhinitis, unspecified: Secondary | ICD-10-CM

## 2012-02-24 ENCOUNTER — Encounter: Payer: Self-pay | Admitting: Internal Medicine

## 2012-02-26 ENCOUNTER — Ambulatory Visit (INDEPENDENT_AMBULATORY_CARE_PROVIDER_SITE_OTHER): Payer: Medicare Other

## 2012-02-26 DIAGNOSIS — J309 Allergic rhinitis, unspecified: Secondary | ICD-10-CM

## 2012-03-03 ENCOUNTER — Ambulatory Visit (INDEPENDENT_AMBULATORY_CARE_PROVIDER_SITE_OTHER): Payer: Medicare Other

## 2012-03-03 DIAGNOSIS — J309 Allergic rhinitis, unspecified: Secondary | ICD-10-CM

## 2012-03-05 NOTE — Procedures (Signed)
The Bravo capsule was placed endoscopically on 02/18/2012.  The patient was intended to remain on his PPI medication during the course of the 48-hr study, but due to slight difficulty in swallowing while the capsule was in place, he elected not to take his PPI's during the study.  RESULTS:  Both day 1 and day 2 of the study had normal DeMeester scores (6.2 and 10.9, respectively; normal= less than 14.72).  No reflux episode lasted more than 5 minutes, and the total fraction of time with a pH less than 4 was 1.1% on day 1, and 1.9% on day 2.   Most of the observed reflux occurred while upright rather than supine.  IMPRESSION:  Normal study, performed essentially off medication (although there was no doubt some residual effect of his PPI medication which had been taken up until the day of the study).  However, this does not exclude the possibility of intermittent reflux episodes, which might have been not observed during the 48-hr interval of this study but which could conceivably occur with sufficient frequency to lead to the patient's problem of hoarseness.  RECOMMENDATION:  Consider a trial of tapering his medication, while watching for symptomatic exacerbations.  Florencia Reasons, M.D. [phone]

## 2012-03-06 ENCOUNTER — Encounter (HOSPITAL_COMMUNITY): Payer: Self-pay | Admitting: Gastroenterology

## 2012-03-10 ENCOUNTER — Ambulatory Visit (INDEPENDENT_AMBULATORY_CARE_PROVIDER_SITE_OTHER): Payer: Medicare Other

## 2012-03-10 ENCOUNTER — Telehealth: Payer: Self-pay | Admitting: Family Medicine

## 2012-03-10 DIAGNOSIS — J309 Allergic rhinitis, unspecified: Secondary | ICD-10-CM

## 2012-03-10 MED ORDER — MECLIZINE HCL 25 MG PO TABS: 25.0000 mg | ORAL_TABLET | Freq: Three times a day (TID) | ORAL | Status: DC | PRN

## 2012-03-10 MED ORDER — ALPRAZOLAM 0.25 MG PO TABS: 0.2500 mg | ORAL_TABLET | Freq: Three times a day (TID) | ORAL | Status: DC | PRN

## 2012-03-10 NOTE — Telephone Encounter (Signed)
Renew both of these with 2 refills

## 2012-03-10 NOTE — Telephone Encounter (Signed)
Called both meds in per jcl w/2 refills

## 2012-03-19 ENCOUNTER — Ambulatory Visit (INDEPENDENT_AMBULATORY_CARE_PROVIDER_SITE_OTHER): Payer: Medicare Other

## 2012-03-19 DIAGNOSIS — J309 Allergic rhinitis, unspecified: Secondary | ICD-10-CM

## 2012-03-25 ENCOUNTER — Ambulatory Visit (INDEPENDENT_AMBULATORY_CARE_PROVIDER_SITE_OTHER): Payer: Medicare Other

## 2012-03-25 ENCOUNTER — Encounter: Payer: Self-pay | Admitting: Medical

## 2012-03-25 ENCOUNTER — Ambulatory Visit (INDEPENDENT_AMBULATORY_CARE_PROVIDER_SITE_OTHER): Payer: Medicare Other | Admitting: Medical

## 2012-03-25 VITALS — BP 110/70 | HR 92 | Temp 98.2°F | Resp 16 | Wt 176.0 lb

## 2012-03-25 DIAGNOSIS — L218 Other seborrheic dermatitis: Secondary | ICD-10-CM

## 2012-03-25 DIAGNOSIS — L219 Seborrheic dermatitis, unspecified: Secondary | ICD-10-CM

## 2012-03-25 DIAGNOSIS — J309 Allergic rhinitis, unspecified: Secondary | ICD-10-CM

## 2012-03-25 NOTE — Patient Instructions (Signed)
Seborrheic Dermatitis Seborrheic dermatitis involves pink or red skin with greasy, flaky scales. This is often found on the scalp, eyebrows, nose, bearded area, and on or behind the ears. It can also occur on the central chest. It often occurs where there are more oil (sebaceous) glands. This condition is also known as dandruff. When this condition affects a baby's scalp, it is called cradle cap. It may come and go for no known reason. It can occur at any time of life from infancy to old age. CAUSES  The cause is unknown. It is not the result of too little moisture or too much oil. In some people, seborrheic dermatitis flare-ups seem to be triggered by stress. It also commonly occurs in people with certain diseases such as Parkinson's disease or HIV/AIDS. SYMPTOMS   Thick scales on the scalp.  Redness on the face or in the armpits.  The skin may seem oily or dry, but moisturizers do not help.  In infants, seborrheic dermatitis appears as scaly redness that does not seem to bother the baby. In some babies, it affects only the scalp. In others, it also affects the neck creases, armpits, groin, or behind the ears.  In adults and adolescents, seborrheic dermatitis may affect only the scalp. It may look patchy or spread out, with areas of redness and flaking. Other areas commonly affected include:  Eyebrows.  Eyelids.  Forehead.  Skin behind the ears.  Outer ears.  Chest.  Armpits.  Nose creases.  Skin creases under the breasts.  Skin between the buttocks.  Groin.  Some adults and adolescents feel itching or burning in the affected areas. DIAGNOSIS  Your caregiver can usually tell what the problem is by doing a physical exam. TREATMENT   Cortisone (steroid) ointments, creams, and lotions can help decrease inflammation.  Babies can be treated with baby oil to soften the scales, then they may be washed with baby shampoo. If this does not help, a prescription topical steroid  medicine may work.  Adults can use medicated shampoos.  Your caregiver may prescribe corticosteroid cream and shampoo containing an antifungal or yeast medicine (ketoconazole). Hydrocortisone or anti-yeast cream can be rubbed directly onto seborrheic dermatitis patches. Yeast does not cause seborrheic dermatitis, but it seems to add to the problem. In infants, seborrheic dermatitis is often worst during the first year of life. It tends to disappear on its own as the child grows. However, it may return during the teenage years. In adults and adolescents, seborrheic dermatitis tends to be a long-lasting condition that comes and goes over many years. HOME CARE INSTRUCTIONS   Use prescribed medicines as directed.  In infants, do not aggressively remove the scales or flakes on the scalp with a comb or by other means. This may lead to hair loss. SEEK MEDICAL CARE IF:   The problem does not improve from the medicated shampoos, lotions, or other medicines given by your caregiver.  You have any other questions or concerns. Document Released: 05/27/2005 Document Revised: 11/26/2011 Document Reviewed: 10/16/2009 [employer] Patient Information 2013 [employer].  

## 2012-03-25 NOTE — Progress Notes (Signed)
Subjective: Here for c/o rash and itchy scalp.  Started about a week ago, feels itchy all over scalp, and has some red bumps forming.  No prior similar.  No flaking.  In general uses topical shampoo for alopecia, but been on this for years without c/o.  No other c/o.   Objective: scalp with faint scattered patches of pink coloration, but otherwise no obvious ringworm, folliculitis, no patches of frank hair loss.  Exam mostly normal appearing.  Assessment: Encounter Diagnosis  Name Primary?  . Seborrheic dermatitis of scalp Yes   Plan: Discussed diagnosis, treatment options.   He wants to try OTC Selsun Blue shampoo for the next 1-2 weeks.  He will use this 2-3 times weekly.  If not improving, let me know. Can also use OTC Cortaid topically if needed.

## 2012-03-30 ENCOUNTER — Telehealth: Payer: Self-pay | Admitting: Internal Medicine

## 2012-03-30 MED ORDER — MONTELUKAST SODIUM 10 MG PO TABS: 10.0000 mg | ORAL_TABLET | Freq: Every day | ORAL | Status: DC

## 2012-03-30 NOTE — Telephone Encounter (Signed)
I spoke with pt and is aware rx has been sent to the pharmacy 

## 2012-04-02 ENCOUNTER — Ambulatory Visit (INDEPENDENT_AMBULATORY_CARE_PROVIDER_SITE_OTHER): Payer: Medicare Other

## 2012-04-02 DIAGNOSIS — J309 Allergic rhinitis, unspecified: Secondary | ICD-10-CM

## 2012-04-08 ENCOUNTER — Ambulatory Visit (INDEPENDENT_AMBULATORY_CARE_PROVIDER_SITE_OTHER): Payer: Medicare Other

## 2012-04-08 DIAGNOSIS — J309 Allergic rhinitis, unspecified: Secondary | ICD-10-CM

## 2012-04-14 ENCOUNTER — Encounter: Payer: Self-pay | Admitting: Internal Medicine

## 2012-04-17 ENCOUNTER — Ambulatory Visit (INDEPENDENT_AMBULATORY_CARE_PROVIDER_SITE_OTHER): Payer: Medicare Other

## 2012-04-17 DIAGNOSIS — J309 Allergic rhinitis, unspecified: Secondary | ICD-10-CM

## 2012-04-22 ENCOUNTER — Ambulatory Visit (INDEPENDENT_AMBULATORY_CARE_PROVIDER_SITE_OTHER): Payer: Medicare Other

## 2012-04-22 DIAGNOSIS — J309 Allergic rhinitis, unspecified: Secondary | ICD-10-CM

## 2012-04-29 ENCOUNTER — Ambulatory Visit (INDEPENDENT_AMBULATORY_CARE_PROVIDER_SITE_OTHER): Payer: Medicare Other

## 2012-04-29 DIAGNOSIS — J309 Allergic rhinitis, unspecified: Secondary | ICD-10-CM

## 2012-05-06 ENCOUNTER — Ambulatory Visit (INDEPENDENT_AMBULATORY_CARE_PROVIDER_SITE_OTHER): Payer: Medicare Other

## 2012-05-06 DIAGNOSIS — J309 Allergic rhinitis, unspecified: Secondary | ICD-10-CM

## 2012-05-14 ENCOUNTER — Ambulatory Visit (INDEPENDENT_AMBULATORY_CARE_PROVIDER_SITE_OTHER): Payer: Medicare Other

## 2012-05-14 DIAGNOSIS — J309 Allergic rhinitis, unspecified: Secondary | ICD-10-CM

## 2012-05-18 ENCOUNTER — Encounter: Payer: Medicare Other | Admitting: Medical

## 2012-05-19 ENCOUNTER — Ambulatory Visit (INDEPENDENT_AMBULATORY_CARE_PROVIDER_SITE_OTHER): Payer: Medicare Other | Admitting: Medical

## 2012-05-19 ENCOUNTER — Encounter: Payer: Self-pay | Admitting: Medical

## 2012-05-19 VITALS — BP 122/80 | HR 78 | Temp 98.2°F | Resp 16 | Ht 70.0 in | Wt 173.0 lb

## 2012-05-19 DIAGNOSIS — R5381 Other malaise: Secondary | ICD-10-CM

## 2012-05-19 DIAGNOSIS — Z23 Encounter for immunization: Secondary | ICD-10-CM

## 2012-05-19 DIAGNOSIS — Z0000 Encounter for general adult medical examination without abnormal findings: Secondary | ICD-10-CM

## 2012-05-19 DIAGNOSIS — Z125 Encounter for screening for malignant neoplasm of prostate: Secondary | ICD-10-CM

## 2012-05-19 DIAGNOSIS — D649 Anemia, unspecified: Secondary | ICD-10-CM | POA: Insufficient documentation

## 2012-05-19 DIAGNOSIS — E291 Testicular hypofunction: Secondary | ICD-10-CM

## 2012-05-19 DIAGNOSIS — J45909 Unspecified asthma, uncomplicated: Secondary | ICD-10-CM

## 2012-05-19 DIAGNOSIS — E785 Hyperlipidemia, unspecified: Secondary | ICD-10-CM

## 2012-05-19 DIAGNOSIS — F341 Dysthymic disorder: Secondary | ICD-10-CM

## 2012-05-19 DIAGNOSIS — F418 Other specified anxiety disorders: Secondary | ICD-10-CM

## 2012-05-19 DIAGNOSIS — F329 Major depressive disorder, single episode, unspecified: Secondary | ICD-10-CM | POA: Insufficient documentation

## 2012-05-19 DIAGNOSIS — R5383 Other fatigue: Secondary | ICD-10-CM

## 2012-05-19 DIAGNOSIS — I2510 Atherosclerotic heart disease of native coronary artery without angina pectoris: Secondary | ICD-10-CM

## 2012-05-19 DIAGNOSIS — R7301 Impaired fasting glucose: Secondary | ICD-10-CM

## 2012-05-19 DIAGNOSIS — K219 Gastro-esophageal reflux disease without esophagitis: Secondary | ICD-10-CM

## 2012-05-19 LAB — CBC WITH DIFFERENTIAL/PLATELET
Basophils Absolute: 0 10*3/uL (ref 0.0–0.1)
Basophils Relative: 0 % (ref 0–1)
Lymphocytes Relative: 23 % (ref 12–46)
Neutro Abs: 6 10*3/uL (ref 1.7–7.7)
Platelets: 223 10*3/uL (ref 150–400)
RDW: 14.2 % (ref 11.5–15.5)
WBC: 8.6 10*3/uL (ref 4.0–10.5)

## 2012-05-19 LAB — COMPREHENSIVE METABOLIC PANEL
AST: 26 U/L (ref 0–37)
Alkaline Phosphatase: 72 U/L (ref 39–117)
BUN: 13 mg/dL (ref 6–23)
Calcium: 9.2 mg/dL (ref 8.4–10.5)
Chloride: 104 mEq/L (ref 96–112)
Creat: 0.88 mg/dL (ref 0.50–1.35)
Glucose, Bld: 92 mg/dL (ref 70–99)

## 2012-05-19 LAB — TSH: TSH: 1.01 u[IU]/mL (ref 0.350–4.500)

## 2012-05-19 LAB — HEMOGLOBIN A1C
Hgb A1c MFr Bld: 5.4 % (ref <5.7)
Mean Plasma Glucose: 108 mg/dL (ref <117)

## 2012-05-19 NOTE — Progress Notes (Signed)
Subjective:   HPI  [name] is a 66 y.o. 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  Wife wanted him to ask about the fact that he stays fatigued all the time.  Seems to have memory problems.  Parents had dementia.    Care team/other providers:  Dr. Sandria Bales care along with me, Edword Cu PA-C  Dr. Joni Fears Young/pulmonology for asthma, allergy shots  Dr. Karleen Hampshire Tilley/cardiology for hx/o CAD, MI, HTN, hyperlipidemia  Dr. Buccinni/Gastroenterology  Dr. Blake/Ophthalmology  Dr. Loistine Chance Taylor/dentist  Vernell Leep, therapist  End of Life Planning/Advance Directives: He has been advised to get living will and health care POA numerous times but has yet to do this.    Fall risk - no recent falls or concerns for falls ADLs  - no issues, independent Depression screen - treated for depression, anxiety, has lots of stressors, seeing therapy, on medication through our office  Reviewed their medical, surgical, family, social, medication, and allergy history and updated chart as appropriate.  Past Medical History  Diagnosis Date  . CAD (coronary artery disease)     Dr. Donnie Aho  . GERD (gastroesophageal reflux disease)   . Hyperlipidemia   . Erectile dysfunction   . Depression   . ADD (attention deficit disorder)   . Hypogonadism male     prior use on Testim  . Hypertension   . Shortness of breath     on exertion  . Anemia     takes Fe -   . Headache     precedes vertigo and nausea  . Complication of anesthesia     slow to wake up, only happened once  . Arthritis     osteoarthritis  . Myocardial infarct 06/08/1994  . Anxiety     sees therapist Vernell Leep  . Allergic rhinitis     skin test 02/23/08 vaccine 2009; allergy shots weekly through Dr. Maple Hudson  . Asthma     Dr. Jetty Duhamel    Past Surgical History  Procedure  Date  . Lumbar disc surgery     x2  . Cardiac catheterization 1999    Dr. Donnie Aho  . Cholecystectomy   . Tonsillectomy     1953  . Vasectomy     1980  . Right thumb surgery for arthritis     Dr. Amanda Pea  . Right index finger  mass removed- arthritis   . Colonoscopy   . Upper gastrointestinal endoscopy   . Coronary angioplasty with stent placement     1999   . Coronary angioplasty     1995 after MI  . Cheilectomy 10/16/2011    Procedure: CHEILECTOMY;  Surgeon: Sherri Rad, MD;  Location: [facility];  Service: Orthopedics;  Laterality: Right;  . Foot surgery     joint scraped, arthritis right foot  . Esophagogastroduodenoscopy 02/18/2012    Procedure: ESOPHAGOGASTRODUODENOSCOPY (EGD);  Surgeon: Florencia Reasons, MD;  Location: [facility];  Service: Endoscopy;  Laterality: N/A;  . Bravo ph study 02/18/2012    Procedure: BRAVO PH STUDY;  Surgeon: Florencia Reasons, MD;  Location: [facility];  Service: Endoscopy;  Laterality: N/A;  . Upper gastrointestinal endoscopy 02/18/12  . Bravo ph study 03/06/2012         Family History  Problem Relation Age of Onset  . Diabetes Mother   . Dementia Mother   . Heart  disease Mother   . Heart disease Father   . Dementia Father   . Chronic fatigue Daughter   . Heart disease Sister     stent ,CAD  . Arthritis Sister   . Cancer Neg Hx     History   Social History  . Marital Status: Married    Spouse Name: N/A    Number of Children: Y  . Years of Education: N/A   Occupational History  . choir singer   . retired- Chiropractor    Social History Main Topics  . Smoking status: Never Smoker   . Smokeless tobacco: Never Used  . Alcohol Use: Yes     Comment: drinks a glass of wine once a month   . Drug Use: No  . Sexually Active: Not on file   Other Topics Concern  . Not on file   Social History Narrative   Married, has 2 children, 38yo son, 35yo daughter with fibromyalgia, chronic  fatigue syndrome, former public health nurse    Current Outpatient Prescriptions on File Prior to Visit  Medication Sig Dispense Refill  . albuterol (VENTOLIN HFA) 108 (90 BASE) MCG/ACT inhaler Inhale 2 puffs into the lungs every 4 (four) hours as needed.  1 Inhaler  3  . ALPRAZolam (XANAX) 0.25 MG tablet Take 1 tablet (0.25 mg total) by mouth 3 (three) times daily as needed for anxiety.  30 tablet  2  . aspirin 81 MG tablet Take 81 mg by mouth daily.        . benzonatate (TESSALON) 200 MG capsule Take 1 capsule (200 mg total) by mouth 3 (three) times daily as needed.  50 capsule  prn  . budesonide-formoterol (SYMBICORT) 160-4.5 MCG/ACT inhaler Inhale 2 puffs into the lungs 2 (two) times daily.  1 Inhaler  prn  . buPROPion (WELLBUTRIN XL) 300 MG 24 hr tablet Take 1 tablet (300 mg total) by mouth daily.  90 tablet  3  . cetirizine (ZYRTEC) 10 MG tablet Take 10 mg by mouth daily.        . clopidogrel (PLAVIX) 75 MG tablet Take 75 mg by mouth daily.       [name] desvenlafaxine (PRISTIQ) 50 MG 24 hr tablet Take 1 tablet (50 mg total) by mouth daily.  90 tablet  3  . diltiazem (CARDIZEM CD) 240 MG 24 hr capsule Take 240 mg by mouth daily.        [name] esomeprazole (NEXIUM) 40 MG capsule Take 40 mg by mouth daily.        [name] ezetimibe-simvastatin (VYTORIN) 10-80 MG per tablet Take 1 tablet by mouth at bedtime.        . ferrous sulfate 325 (65 FE) MG tablet Take 325 mg by mouth daily with breakfast.      . methylphenidate (CONCERTA) 54 MG CR tablet Take 1 tablet (54 mg total) by mouth every morning.  30 tablet  0  . montelukast (SINGULAIR) 10 MG tablet Take 1 tablet (10 mg total) by mouth at bedtime.  90 tablet  2  . niacin (NIASPAN) 1000 MG CR tablet Take 1,500 mg by mouth daily.       . nitroGLYCERIN (NITROSTAT) 0.4 MG SL tablet Place 0.4 mg under the tongue every 5 (five) minutes as needed.       [name] omeprazole-sodium bicarbonate (ZEGERID) 40-1100 MG per capsule Take 1 capsule by mouth daily.       . vitamin C  (ASCORBIC ACID) 500 MG tablet  Take 1 tablet (500 mg total) by mouth daily.  90 tablet  0  . VOLTAREN 1 % GEL Take 1 application by mouth 4 (four) times daily. As needed      . Diphenhyd-Hydrocort-Nystatin (FIRST-DUKES MOUTHWASH) SUSP 1 tablespoon swish and swallow, 4 times daily  150 mL  1  . guaiFENesin (MUCINEX) 600 MG 12 hr tablet Take 1,200 mg by mouth daily as needed.       . meclizine (ANTIVERT) 25 MG tablet Take 1 tablet (25 mg total) by mouth 3 (three) times daily as needed.  60 tablet  2   Current Facility-Administered Medications on File Prior to Visit  Medication Dose Route Frequency Provider Last Rate Last Dose  . methylPREDNISolone acetate (DEPO-MEDROL) injection 80 mg  80 mg Intramuscular Once Jac Canavan, PA        No Known Allergies   Review of Systems Constitutional: denies fever, chills, sweats, unexpected weight change, anorexia, +fatigue Allergy: +chronic allergy problem, gets allergy shots weekly Dermatology: denies changing moles, rash, lumps, new worrisome lesions ENT: no ear pain, sore throat, hoarseness, sinus pain Cardiology: denies chest pain, palpitations, edema, orthopnea, paroxysmal nocturnal dyspnea Respiratory: denies recent cough, shortness of breath, dyspnea on exertion, wheezing, hemoptysis Gastroenterology: +chronic GI intermittent discomfort, GERD problems. No vomiting, diarrhea, constipation, blood in stool, changes in bowel movement, dysphagia Hematology: denies bleeding or bruising problems Ophthalmology: denies vision changes, eye redness, itching, discharge Urology: denies dysuria, difficulty urinating, hematuria, urinary frequency, urgency, incontinence Neurology: no headache, weakness, tingling, numbness, speech abnormality, +memory loss, no falls, dizziness     Objective:   Physical Exam  Filed Vitals:   05/19/12 1438  BP: 122/80  Pulse: 78  Temp: 98.2 F (36.8 C)  Resp: 16    General appearance: alert, no distress, WD/WN,  white male Skin: vertical surgical scar of lumbar spine, scattered benign appearing macules, no particular worrisome lesions HEENT: normocephalic, conjunctiva/corneas normal, sclerae anicteric, PERRLA, EOMi, nares patent, no discharge or erythema, pharynx normal Oral cavity: MMM, tongue normal, teeth in good repair Neck: supple, no lymphadenopathy, no thyromegaly, no masses, normal ROM, no bruits Chest: non tender, normal shape and expansion Heart: RRR, normal S1, S2, no murmurs Lungs: decreased breath sounds, no wheezes, rhonchi, or rales Abdomen: +bs, soft, non tender, non distended, no masses, no hepatomegaly, no splenomegaly, no bruits Back: non tender, normal ROM, no scoliosis Musculoskeletal: upper extremities non tender, no obvious deformity, normal ROM throughout, lower extremities non tender, no obvious deformity, normal ROM throughout Extremities: no edema, no cyanosis, no clubbing Pulses: 2+ symmetric, upper and lower extremities, normal cap refill Neurological: alert, oriented x 3, CN2-12 intact, strength normal upper extremities and lower extremities, sensation normal throughout, DTRs 2+ throughout, no cerebellar signs, gait normal Psychiatric: normal affect, behavior normal, pleasant  GU: normal male external genitalia, uncircumcised, nontender, no masses, no hernia, no lymphadenopathy Rectal: anus with normal tone, prostate WNL, no nodules, occult negative stool   Assessment and Plan :    Encounter Diagnoses  Name Primary?  . Routine general medical examination at a health care facility Yes  . Fatigue   . Anemia   . Impaired fasting glucose   . Depression with anxiety   . Hypogonadism male   . Prostate cancer screening   . Need for pneumococcal vaccination   . CAD (coronary artery disease)   . Hyperlipidemia   . Asthma   . GERD (gastroesophageal reflux disease)     Physical exam - discussed healthy lifestyle, diet, exercise,  preventative care, vaccinations, and  addressed their concerns.  Fatigue - labs today, likely multifactorial given his asthma, anxiety, depression, not currently exercising as much as desired  Anemia - on oral iron, evaluation by GI this year. Repeat CBC today  Impaired fasting glucose - HgbA1C today  Depression with anxiety - medications through [place], sees Vernell Leep for counseling, discussed some of his concerns, discussed considering stress reduction, less obligations from a community service point of view given the other stressors currently  hypogonadism - discussed possible compounded TST therapy, recheck TST level today  PSA screening today  Pneumococcal vaccine, VIS, and counseling given  CAD, Hyperlipidemia - followed by cardiology  Asthma - followed by Dr. Maple Hudson  GERD - on multiple medications per GI, followed by Dr. Clent Ridges  Follow-up pending labs

## 2012-05-20 ENCOUNTER — Telehealth: Payer: Self-pay | Admitting: Family Medicine

## 2012-05-20 LAB — PSA, MEDICARE: PSA: 1.44 ng/mL (ref <=4.00)

## 2012-05-20 NOTE — Telephone Encounter (Signed)
Message copied by Janeice Robinson on Wed May 20, 2012  4:09 PM ------      Message from: Jac Canavan      Created: Tue May 19, 2012  7:59 PM       Why wasn't the fall risk and depression screens done?  He was here for med check plus which is basically a physical.  If we don't do these screens on medicare patients, I can't bill for the annual wellness which is an extra $200 charge related to all the other stufff we do.               The medicare physical are tricky so lets discuss this again on how to handle these type of visits.

## 2012-05-22 ENCOUNTER — Ambulatory Visit (INDEPENDENT_AMBULATORY_CARE_PROVIDER_SITE_OTHER): Payer: Medicare Other

## 2012-05-22 DIAGNOSIS — J309 Allergic rhinitis, unspecified: Secondary | ICD-10-CM

## 2012-05-26 ENCOUNTER — Ambulatory Visit (INDEPENDENT_AMBULATORY_CARE_PROVIDER_SITE_OTHER): Payer: Medicare Other

## 2012-05-26 DIAGNOSIS — J309 Allergic rhinitis, unspecified: Secondary | ICD-10-CM

## 2012-05-28 ENCOUNTER — Telehealth: Payer: Self-pay | Admitting: Internal Medicine

## 2012-05-28 MED ORDER — MOMETASONE FURO-FORMOTEROL FUM 100-5 MCG/ACT IN AERO: 2.0000 | INHALATION_SPRAY | Freq: Two times a day (BID) | RESPIRATORY_TRACT | Status: DC

## 2012-05-28 NOTE — Telephone Encounter (Signed)
Pt returned traige's call.  Pt stated that he will only be available for the next few minutes & will try back tomorrow.    Antionette Fairy

## 2012-05-28 NOTE — Telephone Encounter (Signed)
Suggest change symbicort to [facility] 100  # 1, 2 puffs then rinse, twice daily,   Refill prn

## 2012-05-28 NOTE — Telephone Encounter (Signed)
Spoke with CDY and notified of recs per CDY She verbalized understanding Rx was sent to pharm

## 2012-05-28 NOTE — Telephone Encounter (Signed)
LMTCB x 1 

## 2012-05-28 NOTE — Telephone Encounter (Signed)
Do you have any suggestions as to what inhaler we can switch him to? Please advise. Thanks!

## 2012-05-29 ENCOUNTER — Ambulatory Visit (INDEPENDENT_AMBULATORY_CARE_PROVIDER_SITE_OTHER): Payer: Medicare Other

## 2012-05-29 DIAGNOSIS — J309 Allergic rhinitis, unspecified: Secondary | ICD-10-CM

## 2012-06-02 ENCOUNTER — Encounter: Payer: Self-pay | Admitting: Internal Medicine

## 2012-06-02 ENCOUNTER — Ambulatory Visit (INDEPENDENT_AMBULATORY_CARE_PROVIDER_SITE_OTHER): Payer: Medicare Other

## 2012-06-02 DIAGNOSIS — J309 Allergic rhinitis, unspecified: Secondary | ICD-10-CM

## 2012-06-04 ENCOUNTER — Other Ambulatory Visit: Payer: Self-pay | Admitting: Family Medicine

## 2012-06-04 NOTE — Telephone Encounter (Signed)
IS THIS OK 

## 2012-06-04 NOTE — Telephone Encounter (Signed)
CALLED XANAX IN PER JCL 

## 2012-06-04 NOTE — Telephone Encounter (Signed)
Okay to renew

## 2012-06-09 ENCOUNTER — Telehealth: Payer: Self-pay | Admitting: Family Medicine

## 2012-06-09 NOTE — Telephone Encounter (Signed)
Patient needs refill on concerta, would like to get 3 months again if possible.  Patient has to go OOT, Mother in law is ill. Patient requested to have Rx's mailed to his home address. (verified address in system)

## 2012-06-11 MED ORDER — METHYLPHENIDATE HCL ER (OSM) 54 MG PO TBCR: 54.0000 mg | EXTENDED_RELEASE_TABLET | ORAL | Status: DC

## 2012-06-11 NOTE — Telephone Encounter (Signed)
Concerta written for 3 months

## 2012-06-17 ENCOUNTER — Telehealth: Payer: Self-pay | Admitting: Family Medicine

## 2012-06-17 DIAGNOSIS — F329 Major depressive disorder, single episode, unspecified: Secondary | ICD-10-CM

## 2012-06-17 DIAGNOSIS — F32A Depression, unspecified: Secondary | ICD-10-CM

## 2012-06-17 MED ORDER — BUPROPION HCL ER (XL) 300 MG PO TB24: 300.0000 mg | ORAL_TABLET | Freq: Every day | ORAL | Status: DC

## 2012-06-17 NOTE — Telephone Encounter (Signed)
Wellbutrin renewed.

## 2012-06-26 ENCOUNTER — Ambulatory Visit (INDEPENDENT_AMBULATORY_CARE_PROVIDER_SITE_OTHER): Payer: Medicare Other

## 2012-06-26 DIAGNOSIS — J309 Allergic rhinitis, unspecified: Secondary | ICD-10-CM

## 2012-07-01 ENCOUNTER — Ambulatory Visit (INDEPENDENT_AMBULATORY_CARE_PROVIDER_SITE_OTHER): Payer: Medicare Other

## 2012-07-01 DIAGNOSIS — J309 Allergic rhinitis, unspecified: Secondary | ICD-10-CM

## 2012-07-08 ENCOUNTER — Ambulatory Visit (INDEPENDENT_AMBULATORY_CARE_PROVIDER_SITE_OTHER): Payer: Medicare Other

## 2012-07-08 DIAGNOSIS — J309 Allergic rhinitis, unspecified: Secondary | ICD-10-CM

## 2012-07-14 ENCOUNTER — Telehealth: Payer: Self-pay | Admitting: Family Medicine

## 2012-07-14 DIAGNOSIS — F329 Major depressive disorder, single episode, unspecified: Secondary | ICD-10-CM

## 2012-07-14 DIAGNOSIS — F32A Depression, unspecified: Secondary | ICD-10-CM

## 2012-07-14 MED ORDER — DESVENLAFAXINE SUCCINATE ER 50 MG PO TB24: 50.0000 mg | ORAL_TABLET | Freq: Every day | ORAL | Status: DC

## 2012-07-14 NOTE — Telephone Encounter (Signed)
Pt called and stated that he needed a refill on pristiq 50 mg. He states that you have never filled this for him before. The doc (Dr. Evelene Croon) no longer can see Dal due to that fact that he now has medicare. Pt stated that he spoke to you concerning this as his last office visit and you agreed to prescribe these meds. [facility] is a NEW PHARMACY for this medication.

## 2012-07-14 NOTE — Telephone Encounter (Signed)
PRISTIQ called in. He is no longer seeing Dr. Evelene Croon and I will therefore call this in for him.

## 2012-07-15 ENCOUNTER — Ambulatory Visit (INDEPENDENT_AMBULATORY_CARE_PROVIDER_SITE_OTHER): Payer: Medicare Other

## 2012-07-15 DIAGNOSIS — J309 Allergic rhinitis, unspecified: Secondary | ICD-10-CM

## 2012-07-21 ENCOUNTER — Encounter: Payer: Self-pay | Admitting: Cardiology

## 2012-07-22 ENCOUNTER — Encounter: Payer: Self-pay | Admitting: Cardiology

## 2012-07-22 ENCOUNTER — Ambulatory Visit (INDEPENDENT_AMBULATORY_CARE_PROVIDER_SITE_OTHER): Payer: Medicare Other

## 2012-07-22 DIAGNOSIS — J309 Allergic rhinitis, unspecified: Secondary | ICD-10-CM

## 2012-07-22 DIAGNOSIS — I10 Essential (primary) hypertension: Secondary | ICD-10-CM | POA: Insufficient documentation

## 2012-07-22 DIAGNOSIS — E785 Hyperlipidemia, unspecified: Secondary | ICD-10-CM | POA: Insufficient documentation

## 2012-07-22 DIAGNOSIS — M519 Unspecified thoracic, thoracolumbar and lumbosacral intervertebral disc disorder: Secondary | ICD-10-CM | POA: Insufficient documentation

## 2012-07-22 NOTE — Progress Notes (Unsigned)
Patient ID: [name], male   DOB: 1946-01-20, 67 y.o.   MRN: [id number] [name]  Date of visit:  07/21/2012 DOB:  04-28-1946    Age:  67 yrs. Medical record number:  [id number] Primary Care Provider: Sharlot Gowda C  CURRENT DIAGNOSES  1. CAD,Native  2. Hyperlipidemia  3. Mild Recurrent Major Depression  4. Surgery-PTCA  5. Atherosclerosis Nos  6. Ankylosing Spondylitis  7. Chest pain, other  ALLERGIES  NKDA  MEDICATIONS  1. meclizine 25 mg tablet, PRN  2. Multi-Day tablet, 1 p.o. daily  3. Calcium 600 600 mg (1,500 mg) tablet, 1 p.o. daily  4. alprazolam 0.5 mg tablet, PRN  5. aspirin 81 mg tablet, effervescent, 1 p.o. q.d.  6. Glucosamine 500 mg tablet, 1 p.o. daily  7. Singulair 10 mg tablet, 1 p.o. daily  8. Mucinex 600 mg tablet extended release, PRN  9. Nexium 40 mg Capsule, Delayed Release(E.C.), 1 p.o. daily  10. Symbicort 160-4.5 mcg/Actuation HFA Aerosol Inhaler, 2puff bid to tid  11. Pristiq 50 mg Tablet Sustained Release 24 hr, 1 p.o. daily  12. Carafate 100 mg/mL Suspension, 1ml   tid  13. Ventolin 90 mcg/Actuation Aerosol Inhaler, Daily  14. bupropion HCl 300 mg Tablet Extended Release 24 hr, 1 p.o. daily  15. Concerta 54 mg Tablet Extended Rel 24 hr, 1 p.o. daily  16. iron 325 mg (65 mg iron) Tablet, 1 p.o. daily  17. nitroglycerin 0.4 mg tablet, sublingual, PRN  18. Plavix 75 mg tablet, 1 p.o. daily  19. Niaspan Extended-Release 750 mg tablet extended release 24 hr, 2 qd  20. diltiazem HCl 240 mg capsule, extended release, 1 p.o. daily  21. Vytorin 10-80 10-80 mg tablet, 1 p.o. daily  CHIEF COMPLAINTS  Followup of CAD,Native  HISTORY OF PRESENT ILLNESS  Patient seen for cardiac followup. He described episodes of chest discomfort when he walked into the house was having significant coughing that he said was different than his previous angina. He has been under a lot of stress dealing with his daughter who has chronic fatigue  syndrome. He denies PND orthopnea or claudication. He has no tightness or symptoms suggestive of angina. Lipid testing shows his LDL to be under excellent control.  PAST HISTORY  Past Medical Illnesses:  hypertension, hyperlipidemia, anxiety, depression, anklyosing spondylitis, cervical spondylosis, asthma, lumbar disc disease, ADHD, GERD;  Cardiovascular Illnesses:  S/P MI-inferior December 1995, CAD;  Surgical Procedures:  cholecystectomy (lap), tonsillectomy, vasectomy, nose surg, rt hand joint reconstruction, herniorrhaphy, laminectomy lumbar, foot surgery;  Cardiology Procedures-Invasive:  PTCA of the RCA December 1995, stent RCA December 1999, cardiac cath (left) July 2009;  Cardiology Procedures-Noninvasive:  treadmill cardiolite August 2012;  Cardiac Cath Results:  normal Left main, 60% stenosis proximal LAD, 90% stenosis ostial Diag 1, luminal irregualrities CFX, widely patent RCA stent site, 50% stenosis proximal RCA;  LVEF of 40% documented via nuclear study on 01/17/2011  CARDIO-PULMONARY TEST DATES EKG Date:  07/21/2012;   Cardiac Cath Date:  02/07/2005;  Stent Placement Date: 05/11/1998;  Nuclear Study Date:  01/17/2011;  Chest Xray Date: 02/07/2005;    SOCIAL HISTORY Alcohol Use:  drinks occasionally;  Smoking:  never smoked;  Diet:  fat modified diet;  Lifestyle:  married and 2 children;  Exercise:  some exercise;  Occupation:  Arts development officer, [employer] of [employer], retired and does Catering manager on the side;  Residence:  lives with wife;    REVIEW OF SYSTEMS General:  weight gain of  approximately 5 lbs Ears, Nose, Throat, Mouth:  seasonal sinusitis, chronic hoarseness Respiratory:  mild dyspnea with exertion  Cardiovascular:  please review HPI  Abdominal:  recent reflux evaluation  Musculoskeletal:  chronic low back pain, epidural steroid injections, arthritis of the right hip  Neurological:  occasional vertigo  Psychiatric:  anxiety, situational stress  PHYSICAL  EXAMINATION VITAL SIGNS  Blood Pressure:  124/60 Sitting, Right arm, regular cuff  , 120/66 Standing, Right arm and regular cuff   Pulse:  90/min. Weight:  170.00 lbs. Height:  66"BMI: 27  Constitutional:  pleasant white male in no acute distress Skin:  warm and dry to touch, no apparent skin lesions, or masses noted. Head:  normocephalic, normal hair pattern, no masses or tenderness ENT:  ears, nose and throat reveal no gross abnormalities.  Dentition good. Neck:  supple, no masses, thyromegaly, JVD. Carotid pulses are full and equal bilaterally without bruits. Chest:  normal symmetry, clear to auscultation and percussion. Cardiac:  regular rhythm, normal S1 and S2, No S3 or S4, no murmurs, gallops or rubs detected. Peripheral Pulses:  the femoral,dorsalis pedis, and posterior tibial pulses are full and equal bilaterally with no bruits auscultated. Extremities & Back:  no deformities, clubbing, cyanosis, erythema or edema observed. Normal muscle strength and tone. Neurological:  no gross motor or sensory deficits noted, affect appropriate, oriented x3.  MOST RECENT LIPID PANEL 07/21/12  CHOL TOTL 108 mg/dl, LDL 34 calc, HDL 53 mg/dl, TRIGLYCER 213 mg/dl and CHOL/HDL 2.0 (Calc)  IMPRESSIONS/PLAN  1. Coronary artery disease with previous stenting of the right coronary artery 2. Hyperlipidemia under excellent control 3. Hypertension controlled  Recommendations:  Clinically is getting along reasonably well at this time and his not having severe angina. Suggested followup in 6 months. Continue Plavix and nitroglycerin for the time being. EKG shows no ischemic changes and a previous inferior infarction.  TODAYS ORDERS  1. 12 Lead EKG: Today  2. Lipid Panel: Today                        Cardiology Physician:  Darden Palmer MD [facility]

## 2012-07-29 ENCOUNTER — Ambulatory Visit (INDEPENDENT_AMBULATORY_CARE_PROVIDER_SITE_OTHER): Payer: Medicare Other

## 2012-07-29 DIAGNOSIS — J309 Allergic rhinitis, unspecified: Secondary | ICD-10-CM

## 2012-07-30 ENCOUNTER — Telehealth: Payer: Self-pay | Admitting: Internal Medicine

## 2012-07-30 MED ORDER — MONTELUKAST SODIUM 10 MG PO TABS: 10.0000 mg | ORAL_TABLET | Freq: Every day | ORAL | Status: DC

## 2012-07-30 NOTE — Telephone Encounter (Signed)
Called, spoke with pt.  States he has ran out of singulair.  He ordered it today through mail order but it will take up to 7 days to arrive.  He is requesting a 7 day rx to be sent to [facility] as we do not have samples.  Rx called in.  Pt aware.

## 2012-08-03 ENCOUNTER — Encounter: Payer: Self-pay | Admitting: Internal Medicine

## 2012-08-05 ENCOUNTER — Ambulatory Visit (INDEPENDENT_AMBULATORY_CARE_PROVIDER_SITE_OTHER): Payer: Medicare Other

## 2012-08-05 DIAGNOSIS — J309 Allergic rhinitis, unspecified: Secondary | ICD-10-CM

## 2012-08-12 ENCOUNTER — Ambulatory Visit: Payer: Medicare Other

## 2012-08-13 ENCOUNTER — Ambulatory Visit (INDEPENDENT_AMBULATORY_CARE_PROVIDER_SITE_OTHER): Payer: Medicare Other

## 2012-08-13 DIAGNOSIS — J309 Allergic rhinitis, unspecified: Secondary | ICD-10-CM

## 2012-08-19 ENCOUNTER — Ambulatory Visit (INDEPENDENT_AMBULATORY_CARE_PROVIDER_SITE_OTHER): Payer: Medicare Other

## 2012-08-19 DIAGNOSIS — J309 Allergic rhinitis, unspecified: Secondary | ICD-10-CM

## 2012-08-26 ENCOUNTER — Ambulatory Visit (INDEPENDENT_AMBULATORY_CARE_PROVIDER_SITE_OTHER): Payer: Medicare Other

## 2012-08-26 DIAGNOSIS — J309 Allergic rhinitis, unspecified: Secondary | ICD-10-CM

## 2012-09-01 ENCOUNTER — Ambulatory Visit (INDEPENDENT_AMBULATORY_CARE_PROVIDER_SITE_OTHER): Payer: Medicare Other | Admitting: Family Medicine

## 2012-09-01 ENCOUNTER — Encounter: Payer: Self-pay | Admitting: Family Medicine

## 2012-09-01 VITALS — BP 118/80 | HR 88 | Wt 178.0 lb

## 2012-09-01 DIAGNOSIS — R609 Edema, unspecified: Secondary | ICD-10-CM

## 2012-09-01 DIAGNOSIS — Z6379 Other stressful life events affecting family and household: Secondary | ICD-10-CM

## 2012-09-01 NOTE — Patient Instructions (Signed)
Get back to normal lifestyle in terms of physical activities and eating habits. A few pounds off and see what that'll do

## 2012-09-01 NOTE — Progress Notes (Signed)
  Subjective:    Patient ID: [name], male    DOB: July 26, 1945, 67 y.o.   MRN: [id number]  HPI He has noticed over the last few weeks that when he wakes up his hands are swollen and painfuland he also notes that in the feet they're not painful.no chest pain, PND, DOE. He recently saw his cardiologist and was told that nothing was normal. The medications have been unchanged.he does note that his lifestyle has changed and now he is sitting much more than he normally does. He has also been a few pounds. He also continues to remain stressed out over his daughter who apparently has chronic fatigue syndrome as well as fibromyalgia. He and his wife both help take care of her. He continues in counseling but does not seem to have made any progress in dealing with his daughter.   Review of Systems     Objective:   Physical Exam Alert and in no distress. Cardiac exam shows regular rhythm without murmurs or gallops. Lungs clear to auscultation. Exam of his hands shows no swelling with good motion and strength. Exam of his lower extremity shows good pulses however 1+ pitting edema is noted halfway up his calf.       Assessment & Plan:  Peripheral edema  Stress due to illness of family member  Discussed the fact that his swelling could easily be his deconditioning and lack of exercise. Strongly encouraged him to back on a regular exercise program as well as lose a few pounds. Discussed distress and he is under from his daughter. Strongly encouraged him to have better delineation of responsibility since he seems to be taking on her burden in terms of the diagnosis she has. Encouraged him to continue in counseling.

## 2012-09-02 ENCOUNTER — Ambulatory Visit (INDEPENDENT_AMBULATORY_CARE_PROVIDER_SITE_OTHER): Payer: Medicare Other

## 2012-09-02 DIAGNOSIS — J309 Allergic rhinitis, unspecified: Secondary | ICD-10-CM

## 2012-09-09 ENCOUNTER — Ambulatory Visit (INDEPENDENT_AMBULATORY_CARE_PROVIDER_SITE_OTHER): Payer: Medicare Other

## 2012-09-09 DIAGNOSIS — J309 Allergic rhinitis, unspecified: Secondary | ICD-10-CM

## 2012-09-21 ENCOUNTER — Telehealth: Payer: Self-pay | Admitting: Internal Medicine

## 2012-09-21 MED ORDER — METHYLPHENIDATE HCL ER (OSM) 54 MG PO TBCR: 54.0000 mg | EXTENDED_RELEASE_TABLET | ORAL | Status: DC

## 2012-09-21 NOTE — Telephone Encounter (Signed)
Pt needs a refill for methylphenidate 54mg  3 month supply. Call when ready

## 2012-09-23 ENCOUNTER — Ambulatory Visit (INDEPENDENT_AMBULATORY_CARE_PROVIDER_SITE_OTHER): Payer: Medicare Other

## 2012-09-23 DIAGNOSIS — J309 Allergic rhinitis, unspecified: Secondary | ICD-10-CM

## 2012-09-30 ENCOUNTER — Ambulatory Visit (INDEPENDENT_AMBULATORY_CARE_PROVIDER_SITE_OTHER): Payer: Medicare Other

## 2012-09-30 DIAGNOSIS — J309 Allergic rhinitis, unspecified: Secondary | ICD-10-CM

## 2012-10-02 ENCOUNTER — Encounter: Payer: Self-pay | Admitting: Family Medicine

## 2012-10-02 ENCOUNTER — Ambulatory Visit (INDEPENDENT_AMBULATORY_CARE_PROVIDER_SITE_OTHER): Payer: Medicare Other | Admitting: Family Medicine

## 2012-10-02 VITALS — BP 122/82 | HR 82 | Wt 174.0 lb

## 2012-10-02 DIAGNOSIS — R609 Edema, unspecified: Secondary | ICD-10-CM

## 2012-10-02 NOTE — Progress Notes (Signed)
  Subjective:    Patient ID: [name], male    DOB: 17-Dec-1945, 67 y.o.   MRN: [id number]  HPI He is here for evaluation of swelling and tingling in his lower extremities. He has had difficulty with swelling for the last several months but today while sitting on a stool and writing checks, he noted the onset of a tingling sensation as well as noting pitting in his lower cavities. No chest pain, shortness of breath, PND. Swelling is noted in no other areas to   Review of Systems     Objective:   Physical Exam And in no distress. 1-2+ pitting edema is noted in the lower extremities. Cardiac exam shows regular rhythm without murmurs or gallops. Lungs clear to auscultation       Assessment & Plan:  Dependent edema I explained that the edema is not unusual and is not a sign of anything dangerous. Recommend conservative care for this.

## 2012-10-06 ENCOUNTER — Other Ambulatory Visit: Payer: Self-pay | Admitting: Internal Medicine

## 2012-10-06 ENCOUNTER — Encounter: Payer: Self-pay | Admitting: Cardiology

## 2012-10-06 ENCOUNTER — Ambulatory Visit
Admission: RE | Admit: 2012-10-06 | Discharge: 2012-10-06 | Disposition: A | Discharge status: Home / Self Care | Payer: Medicare Other | Source: Ambulatory Visit | Attending: Cardiology | Admitting: Cardiology

## 2012-10-06 ENCOUNTER — Other Ambulatory Visit: Payer: Self-pay | Admitting: Cardiology

## 2012-10-06 DIAGNOSIS — R609 Edema, unspecified: Secondary | ICD-10-CM

## 2012-10-06 NOTE — Progress Notes (Signed)
Patient ID: [name], male   DOB: 1946/04/30, 67 y.o.   MRN: [id number]  [name]  Date of visit:  10/06/2012 DOB:  1945-08-30    Age:  67 yrs. Medical record number:  [id number]     Account number:  [id number] Primary Care Provider: Sharlot Gowda C ____________________________ CURRENT DIAGNOSES  1. Edema  2. CAD,Native  3. Hyperlipidemia  4. Mild Recurrent Major Depression  5. Atherosclerosis Nos  6. Ankylosing Spondylitis  7. Surgery-PTCA  8. Dyspnea ____________________________ ALLERGIES  NKDA ____________________________ MEDICATIONS  1. meclizine 25 mg tablet, PRN  2. Multi-Day tablet, 1 p.o. daily  3. Calcium 600 600 mg (1,500 mg) tablet, 1 p.o. daily  4. alprazolam 0.5 mg tablet, PRN  5. aspirin 81 mg tablet, effervescent, 1 p.o. q.d.  6. Glucosamine 500 mg tablet, 1 p.o. daily  7. Singulair 10 mg tablet, 1 p.o. daily  8. Mucinex 600 mg tablet extended release, PRN  9. Symbicort 160-4.5 mcg/Actuation HFA Aerosol Inhaler, 2puff bid to tid  10. Pristiq 50 mg Tablet Sustained Release 24 hr, 1 p.o. daily  11. Carafate 100 mg/mL Suspension, 1ml   tid  12. Ventolin 90 mcg/Actuation Aerosol Inhaler, Daily  13. bupropion HCl 300 mg Tablet Extended Release 24 hr, 1 p.o. daily  14. Concerta 54 mg Tablet Extended Rel 24 hr, 1 p.o. daily  15. iron 325 mg (65 mg iron) Tablet, 1 p.o. daily  16. nitroglycerin 0.4 mg tablet, sublingual, PRN  17. Plavix 75 mg tablet, 1 p.o. daily  18. Niaspan Extended-Release 750 mg tablet extended release 24 hr, 2 qd  19. diltiazem HCl 240 mg capsule, extended release, 1 p.o. daily  20. atorvastatin 80 mg tablet, 1 p.o. daily  21. Zegerid 40-1.1 mg-gram capsule, 1 p.o. daily  22. benzonatate 100 mg capsule, PRN  23. Dulera 100-5 mcg/actuation HFA aerosol inhaler, 2 puffs bid  24. Zyrtec 10 mg tablet, 1 p.o. daily  25. furosemide 20 mg tablet, 1 p.o. daily ____________________________ CHIEF COMPLAINTS  Leg and ankle  edema ____________________________ HISTORY OF PRESENT ILLNESS  Patient seen early for evaluation of edema. He developed peripheral edema over the past 2-3 weeks. He has had an epidural steroid injection but notes that he has never developed edema when he has had these previously. He has not had angina and denies PND orthopnea. He has not been using nonsteroidal anti-inflammatory agents. The edema has been concerning to him and will go down the morning but comes up typically when he is on his feet. 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, CAD;  Surgical Procedures:  cholecystectomy (lap), tonsillectomy, vasectomy, nose surg, rt hand joint reconstruction, herniorrhaphy, laminectomy lumbar, foot surgery;  Cardiology Procedures-Invasive:  PTCA of the RCA December 1995, stent RCA December 1999, cardiac cath (left) July 2009;  Cardiology Procedures-Noninvasive:  treadmill cardiolite August 2012;  Cardiac Cath Results:  normal Left main, 60% stenosis proximal LAD, 90% stenosis ostial Diag 1, luminal irregualrities CFX, widely patent RCA stent site, 50% stenosis proximal RCA;  LVEF of 40% documented via nuclear study on 01/17/2011,   ____________________________ CARDIO-PULMONARY TEST DATES EKG Date:  10/06/2012;   Cardiac Cath Date:  02/07/2005;  Stent Placement Date: 05/11/1998;  Nuclear Study Date:  01/17/2011;  Chest Xray Date: 02/07/2005;   ____________________________ FAMILY HISTORY Father -  alive and well, history of CABG, dementia and anxiety; Mother -  age 92, died of diabetes and dementia; Sister 1 - age 37,  history of PTCA/stent;  ____________________________ SOCIAL HISTORY Alcohol Use:  drinks occasionally;  Smoking:  never smoked;  Diet:  fat modified diet;  Lifestyle:   married and 2 children;  Exercise:  some exercise;  Occupation:  Arts development officer, [employer] of [employer], retired and does Catering manager on the side;  Residence:  lives with wife;   ____________________________ REVIEW OF SYSTEMS General:  weight gain of approximately 5 lbs Ears, Nose, Throat, Mouth:  seasonal sinusitis, chronic hoarseness Respiratory: mild dyspnea with exertion Cardiovascular:  please review HPI Abdominal: denies dyspepsia, GI bleeding, constipation, or diarrhea Musculoskeletal:  chronic low back pain, epidural steroid injections, arthritis of the right hip Neurological:  occasional vertigo Psychiatric:  anxiety, situational stress  ____________________________ PHYSICAL EXAMINATION VITAL SIGNS  Blood Pressure:  124/70 Sitting, Left arm, regular cuff  , 124/70 Standing, Left arm and regular cuff   Pulse:  100/min. Weight:  176.00 lbs. Height:  66"BMI: 28  Constitutional:  pleasant white male in no acute distress Skin:  warm and dry to touch, no apparent skin lesions, or masses noted. Head:  normocephalic, normal hair pattern, no masses or tenderness ENT:  ears, nose and throat reveal no gross abnormalities.  Dentition good. Neck:  supple, no masses, thyromegaly, JVD. Carotid pulses are full and equal bilaterally without bruits. Chest:  normal symmetry, clear to auscultation and percussion. Cardiac:  regular rhythm, normal S1 and S2, No S3 or S4, no murmurs, gallops or rubs detected. Peripheral Pulses:  the femoral,dorsalis pedis, and posterior tibial pulses are full and equal bilaterally with no bruits auscultated. Extremities & Back:  no deformities, clubbing, cyanosis, erythema or edema observed. Normal muscle strength and tone. Neurological:  no gross motor or sensory deficits noted, affect appropriate, oriented x3. ____________________________ MOST RECENT LIPID PANEL 07/21/12  CHOL TOTL 108 mg/dl, LDL 34 calc, HDL 53 mg/dl, TRIGLYCER 161 mg/dl and  CHOL/HDL 2.0 (Calc) ____________________________ IMPRESSIONS/PLAN  1. Worsening peripheral edema which could be multifactorial. We need to be sure he does not have congestive heart failure. It is possible it could be related to his epidural steroid injections. 2. Coronary artery disease with previous stenting of the right coronary artery 3. Hyperlipidemia 4. Hypertension controlled  Recommendations:  Obtain echocardiogram and chest x-ray. Obtain BNP level. Obtain labs including urinalysis to be sure he does not have nephrotic syndrome. EKG shows sinus rhythm. Begin furosemide 20 mg daily. ____________________________ TODAYS ORDERS  1. 12 Lead EKG: Today  2. Urinalysis: Today  3. Comprehensive Metabolic Panel: Today  4. CHEST XRAY: Today  5. 2D, color flow, doppler: First Available  6. d dimer  7. Return Visit: 2 weeks                       ____________________________ Cardiology Physician:  Darden Palmer MD [facility]

## 2012-10-07 ENCOUNTER — Ambulatory Visit (INDEPENDENT_AMBULATORY_CARE_PROVIDER_SITE_OTHER): Payer: Medicare Other

## 2012-10-07 DIAGNOSIS — J309 Allergic rhinitis, unspecified: Secondary | ICD-10-CM

## 2012-10-07 NOTE — Telephone Encounter (Signed)
Please advise if okay to refill; has been about 2 years since last given. Thanks

## 2012-10-07 NOTE — Telephone Encounter (Signed)
Ok to refill 

## 2012-10-14 ENCOUNTER — Other Ambulatory Visit: Payer: Self-pay | Admitting: Family Medicine

## 2012-10-14 NOTE — Telephone Encounter (Signed)
Okay to renew with 2 refills

## 2012-10-14 NOTE — Telephone Encounter (Signed)
IS THIS OK 

## 2012-10-15 ENCOUNTER — Encounter: Payer: Self-pay | Admitting: Family Medicine

## 2012-10-15 ENCOUNTER — Ambulatory Visit (INDEPENDENT_AMBULATORY_CARE_PROVIDER_SITE_OTHER): Payer: Medicare Other

## 2012-10-15 DIAGNOSIS — J309 Allergic rhinitis, unspecified: Secondary | ICD-10-CM

## 2012-10-16 ENCOUNTER — Ambulatory Visit (INDEPENDENT_AMBULATORY_CARE_PROVIDER_SITE_OTHER): Payer: Medicare Other

## 2012-10-16 DIAGNOSIS — J309 Allergic rhinitis, unspecified: Secondary | ICD-10-CM

## 2012-10-21 ENCOUNTER — Ambulatory Visit (INDEPENDENT_AMBULATORY_CARE_PROVIDER_SITE_OTHER): Payer: Medicare Other

## 2012-10-21 DIAGNOSIS — J309 Allergic rhinitis, unspecified: Secondary | ICD-10-CM

## 2012-10-26 ENCOUNTER — Encounter: Payer: Self-pay | Admitting: Internal Medicine

## 2012-10-28 ENCOUNTER — Ambulatory Visit (INDEPENDENT_AMBULATORY_CARE_PROVIDER_SITE_OTHER): Payer: Medicare Other

## 2012-10-28 DIAGNOSIS — J309 Allergic rhinitis, unspecified: Secondary | ICD-10-CM

## 2012-11-04 ENCOUNTER — Ambulatory Visit (INDEPENDENT_AMBULATORY_CARE_PROVIDER_SITE_OTHER): Payer: Medicare Other

## 2012-11-04 DIAGNOSIS — J309 Allergic rhinitis, unspecified: Secondary | ICD-10-CM

## 2012-11-11 ENCOUNTER — Ambulatory Visit: Payer: Medicare Other

## 2012-11-12 ENCOUNTER — Ambulatory Visit (INDEPENDENT_AMBULATORY_CARE_PROVIDER_SITE_OTHER): Payer: Medicare Other

## 2012-11-12 DIAGNOSIS — J309 Allergic rhinitis, unspecified: Secondary | ICD-10-CM

## 2012-11-18 ENCOUNTER — Ambulatory Visit: Payer: Medicare Other

## 2012-11-20 ENCOUNTER — Ambulatory Visit (INDEPENDENT_AMBULATORY_CARE_PROVIDER_SITE_OTHER): Payer: Medicare Other

## 2012-11-20 DIAGNOSIS — J309 Allergic rhinitis, unspecified: Secondary | ICD-10-CM

## 2012-11-26 ENCOUNTER — Ambulatory Visit (INDEPENDENT_AMBULATORY_CARE_PROVIDER_SITE_OTHER): Payer: Medicare Other

## 2012-11-26 DIAGNOSIS — J309 Allergic rhinitis, unspecified: Secondary | ICD-10-CM

## 2012-12-02 ENCOUNTER — Ambulatory Visit (INDEPENDENT_AMBULATORY_CARE_PROVIDER_SITE_OTHER): Payer: Medicare Other

## 2012-12-02 DIAGNOSIS — J309 Allergic rhinitis, unspecified: Secondary | ICD-10-CM

## 2012-12-03 ENCOUNTER — Ambulatory Visit: Payer: Medicare Other

## 2012-12-09 ENCOUNTER — Ambulatory Visit (INDEPENDENT_AMBULATORY_CARE_PROVIDER_SITE_OTHER): Payer: Medicare Other

## 2012-12-09 DIAGNOSIS — J309 Allergic rhinitis, unspecified: Secondary | ICD-10-CM

## 2012-12-15 ENCOUNTER — Other Ambulatory Visit: Payer: Self-pay | Admitting: Family Medicine

## 2012-12-15 NOTE — Telephone Encounter (Signed)
IS THIS OK 

## 2012-12-16 ENCOUNTER — Ambulatory Visit (INDEPENDENT_AMBULATORY_CARE_PROVIDER_SITE_OTHER): Payer: Medicare Other

## 2012-12-16 ENCOUNTER — Telehealth: Payer: Self-pay | Admitting: Internal Medicine

## 2012-12-16 DIAGNOSIS — J309 Allergic rhinitis, unspecified: Secondary | ICD-10-CM

## 2012-12-16 MED ORDER — MECLIZINE HCL 25 MG PO TABS: ORAL_TABLET | ORAL | Status: DC

## 2012-12-16 NOTE — Telephone Encounter (Signed)
Requesting for a 90 day supply meclizine 25mg 

## 2012-12-23 ENCOUNTER — Ambulatory Visit (INDEPENDENT_AMBULATORY_CARE_PROVIDER_SITE_OTHER): Payer: Medicare Other

## 2012-12-23 DIAGNOSIS — J309 Allergic rhinitis, unspecified: Secondary | ICD-10-CM

## 2012-12-30 ENCOUNTER — Ambulatory Visit: Payer: Medicare Other

## 2013-01-06 ENCOUNTER — Ambulatory Visit (INDEPENDENT_AMBULATORY_CARE_PROVIDER_SITE_OTHER): Payer: Medicare Other

## 2013-01-06 DIAGNOSIS — J309 Allergic rhinitis, unspecified: Secondary | ICD-10-CM

## 2013-01-13 ENCOUNTER — Telehealth: Payer: Self-pay | Admitting: Internal Medicine

## 2013-01-13 ENCOUNTER — Ambulatory Visit (INDEPENDENT_AMBULATORY_CARE_PROVIDER_SITE_OTHER): Payer: Medicare Other

## 2013-01-13 ENCOUNTER — Telehealth: Payer: Self-pay | Admitting: Medical

## 2013-01-13 DIAGNOSIS — J309 Allergic rhinitis, unspecified: Secondary | ICD-10-CM

## 2013-01-13 MED ORDER — METHYLPHENIDATE HCL ER (OSM) 54 MG PO TBCR: 54.0000 mg | EXTENDED_RELEASE_TABLET | ORAL | Status: DC

## 2013-01-13 NOTE — Telephone Encounter (Signed)
Pt needs a refill on concerta 54mg . Call when ready

## 2013-01-13 NOTE — Telephone Encounter (Signed)
Left message rx is ready

## 2013-01-20 ENCOUNTER — Ambulatory Visit (INDEPENDENT_AMBULATORY_CARE_PROVIDER_SITE_OTHER): Payer: Medicare Other

## 2013-01-20 DIAGNOSIS — J309 Allergic rhinitis, unspecified: Secondary | ICD-10-CM

## 2013-01-27 ENCOUNTER — Ambulatory Visit (INDEPENDENT_AMBULATORY_CARE_PROVIDER_SITE_OTHER): Payer: Medicare Other

## 2013-01-27 DIAGNOSIS — J309 Allergic rhinitis, unspecified: Secondary | ICD-10-CM

## 2013-01-28 ENCOUNTER — Other Ambulatory Visit: Payer: Self-pay

## 2013-01-28 DIAGNOSIS — F32A Depression, unspecified: Secondary | ICD-10-CM

## 2013-01-28 DIAGNOSIS — F329 Major depressive disorder, single episode, unspecified: Secondary | ICD-10-CM

## 2013-01-28 MED ORDER — DESVENLAFAXINE SUCCINATE ER 50 MG PO TB24: 50.0000 mg | ORAL_TABLET | Freq: Every day | ORAL | Status: DC

## 2013-01-28 MED ORDER — BUPROPION HCL ER (XL) 300 MG PO TB24: 300.0000 mg | ORAL_TABLET | Freq: Every day | ORAL | Status: DC

## 2013-01-28 NOTE — Telephone Encounter (Signed)
SENT MEDS IN  

## 2013-01-29 ENCOUNTER — Encounter: Payer: Self-pay | Admitting: Cardiology

## 2013-01-29 NOTE — Progress Notes (Signed)
Patient ID: [name], male   DOB: [birth year], 67 y.o.   MRN: [id number]   [name]  Date of visit:  01/29/2013 DOB:  Feb 15, 1946    Age:  67 yrs. Medical record number:  [id number]     Account number:  [id number] Primary Care Provider: Sharlot Gowda Patterson ____________________________ CURRENT DIAGNOSES  1. CAD,Native  2. Hyperlipidemia  3. Mild Recurrent Major Depression  4. Atherosclerosis Nos  5. Ankylosing Spondylitis  6. Surgery-PTCA ____________________________ ALLERGIES  No Known Drug Allergies ____________________________ MEDICATIONS  1. meclizine 25 mg tablet, PRN  2. Multi-Day tablet, 1 p.o. daily  3. Calcium 600 600 mg (1,500 mg) tablet, 1 p.o. daily  4. alprazolam 0.5 mg tablet, PRN  5. Glucosamine 500 mg tablet, 1 p.o. daily  6. Singulair 10 mg tablet, 1 p.o. daily  7. Mucinex 600 mg tablet extended release, PRN  8. Symbicort 160-4.5 mcg/Actuation HFA Aerosol Inhaler, 2puff bid to tid  9. Pristiq 50 mg Tablet Sustained Release 24 hr, 1 p.o. daily  10. Carafate 100 mg/mL Suspension, 1ml   tid  11. Ventolin 90 mcg/Actuation Aerosol Inhaler, Daily  12. bupropion HCl 300 mg Tablet Extended Release 24 hr, 1 p.o. daily  13. Concerta 54 mg Tablet Extended Rel 24 hr, 1 p.o. daily  14. nitroglycerin 0.4 mg tablet, sublingual, PRN  15. Niaspan Extended-Release 750 mg tablet extended release 24 hr, 2 qd  16. diltiazem HCl 240 mg capsule, extended release, 1 p.o. daily  17. atorvastatin 80 mg tablet, 1 p.o. daily  18. benzonatate 100 mg capsule, PRN  19. Dulera 100-5 mcg/actuation HFA aerosol inhaler, 2 puffs bid  20. Zyrtec 10 mg tablet, 1 p.o. daily  21. clopidogrel 75 mg tablet, 1 p.o. daily  22. aspirin 81 mg tablet,chewable, 1 p.o. daily ____________________________ CHIEF COMPLAINTS  Followup of CAD,Native  Followup of Edema ____________________________ HISTORY OF PRESENT ILLNESS  Patient seen for cardiac followup. Since he was previously here the  previous edema that he had has largely resolved with leg elevation and p.r.n. furosemide. His echo showed mildly reduced LV function due to his previous infarct. He has no angina but does have some mild dyspnea with exertion. His major complaint is significant courses. He continues to have significant situational stress dealing with his daughter. He is still having a lot of arthritic problems involving his neck. He denies PND, orthopnea or claudication. 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, CAD;  Surgical Procedures:  cholecystectomy (lap), tonsillectomy, vasectomy, nose surg, rt hand joint reconstruction, herniorrhaphy, laminectomy lumbar, foot surgery;  Cardiology Procedures-Invasive:  PTCA of the RCA December 1995, stent RCA December 1999, cardiac cath (left) July 2009;  Cardiology Procedures-Noninvasive:  treadmill cardiolite August 2012, echocardiogram May 2014;  Cardiac Cath Results:  normal Left main, 60% stenosis proximal LAD, 90% stenosis ostial Diag 1, luminal irregualrities CFX, widely patent RCA stent site, 50% stenosis proximal RCA;  LVEF of 45% documented via echocardiogram on 10/14/2012,   ____________________________ CARDIO-PULMONARY TEST DATES EKG Date:  10/06/2012;   Cardiac Cath Date:  02/07/2005;  Stent Placement Date: 05/11/1998;  Nuclear Study Date:  01/17/2011;  Echocardiography Date: 10/14/2012;  Chest Xray Date: 10/06/2012;   ____________________________ SOCIAL HISTORY Alcohol Use:  drinks occasionally;  Smoking:  never smoked;  Diet:  fat modified diet;  Lifestyle:  married and 2 children;  Exercise:  some exercise;  Occupation:  Oncologist mgr, [employer] of [employer], retired and does Catering manager on the side;  Residence:  lives with  wife;   ____________________________ REVIEW OF SYSTEMS General:  weight loss of approximately 5 lbs Ears, Nose, Throat, Mouth:  seasonal sinusitis, chronic hoarseness Respiratory: mild dyspnea with exertion Cardiovascular:  please review HPI Abdominal: denies dyspepsia, GI bleeding, constipation, or diarrhea Musculoskeletal:  chronic low back pain, arthritis of the right hip, recent injury to finger Neurological:  occasional vertigo Psychiatric:  anxiety, situational stress  ____________________________ PHYSICAL EXAMINATION VITAL SIGNS  Blood Pressure:  132/60 Sitting, Right arm, regular cuff   Pulse:  96/min. Weight:  170.00 lbs. Height:  66"BMI: 27  Constitutional:  pleasant white male in no acute distress Skin:  warm and dry to touch, no apparent skin lesions, or masses noted. Head:  normocephalic, normal hair pattern, no masses or tenderness ENT:  ears, nose and throat reveal no gross abnormalities.  Dentition good. Neck:  supple, no masses, thyromegaly, JVD. Carotid pulses are full and equal bilaterally without bruits. Chest:  normal symmetry, clear to auscultation and percussion. Cardiac:  regular rhythm, normal S1 and S2, No S3 or S4, no murmurs, gallops or rubs detected. Peripheral Pulses:  the femoral,dorsalis pedis, and posterior tibial pulses are full and equal bilaterally with no bruits auscultated. Extremities & Back:  no deformities, clubbing, cyanosis, erythema or edema observed. Normal muscle strength and tone. Neurological:  no gross motor or sensory deficits noted, affect appropriate, oriented x3. ____________________________ MOST RECENT LIPID PANEL 07/21/12  CHOL TOTL 108 mg/dl, LDL 34 calc, HDL 53 mg/dl, TRIGLYCER 161 mg/dl and CHOL/HDL 2.0 (Calc) ____________________________ IMPRESSIONS/PLAN  1. Coronary artery disease with previous stenting of the right coronary artery 2. Hypertension controlled 3. Hyperlipidemia under treatment 4. Peripheral edema likely due to  venous insufficiency  Recommendations:  Clinically doing a good but significant stress. Use furosemide p.r.n. I will plan to see in followup in 6 months. ____________________________ TODAYS ORDERS  1. Return Visit: 6 months  2. 12 Lead EKG: 6 months                       ____________________________ Cardiology Physician:  Darden Palmer MD [facility]

## 2013-02-03 ENCOUNTER — Ambulatory Visit (INDEPENDENT_AMBULATORY_CARE_PROVIDER_SITE_OTHER): Payer: Medicare Other

## 2013-02-03 ENCOUNTER — Other Ambulatory Visit: Payer: Self-pay | Admitting: Internal Medicine

## 2013-02-03 DIAGNOSIS — J309 Allergic rhinitis, unspecified: Secondary | ICD-10-CM

## 2013-02-10 ENCOUNTER — Ambulatory Visit (INDEPENDENT_AMBULATORY_CARE_PROVIDER_SITE_OTHER): Payer: Medicare Other

## 2013-02-10 DIAGNOSIS — J309 Allergic rhinitis, unspecified: Secondary | ICD-10-CM

## 2013-02-17 ENCOUNTER — Ambulatory Visit: Payer: Medicare Other

## 2013-02-18 ENCOUNTER — Ambulatory Visit (INDEPENDENT_AMBULATORY_CARE_PROVIDER_SITE_OTHER): Payer: Medicare Other

## 2013-02-18 DIAGNOSIS — J309 Allergic rhinitis, unspecified: Secondary | ICD-10-CM

## 2013-02-24 ENCOUNTER — Ambulatory Visit: Payer: Medicare Other

## 2013-02-25 ENCOUNTER — Ambulatory Visit (INDEPENDENT_AMBULATORY_CARE_PROVIDER_SITE_OTHER): Payer: Medicare Other

## 2013-02-25 DIAGNOSIS — J309 Allergic rhinitis, unspecified: Secondary | ICD-10-CM

## 2013-02-26 ENCOUNTER — Ambulatory Visit (INDEPENDENT_AMBULATORY_CARE_PROVIDER_SITE_OTHER): Payer: Medicare Other

## 2013-02-26 DIAGNOSIS — J309 Allergic rhinitis, unspecified: Secondary | ICD-10-CM

## 2013-03-04 ENCOUNTER — Ambulatory Visit (INDEPENDENT_AMBULATORY_CARE_PROVIDER_SITE_OTHER): Payer: Medicare Other

## 2013-03-04 DIAGNOSIS — J309 Allergic rhinitis, unspecified: Secondary | ICD-10-CM

## 2013-03-05 ENCOUNTER — Encounter: Payer: Self-pay | Admitting: Internal Medicine

## 2013-03-05 ENCOUNTER — Ambulatory Visit (INDEPENDENT_AMBULATORY_CARE_PROVIDER_SITE_OTHER): Payer: Medicare Other | Admitting: Internal Medicine

## 2013-03-05 VITALS — BP 118/72 | HR 80 | Ht 70.0 in | Wt 178.8 lb

## 2013-03-05 DIAGNOSIS — Z23 Encounter for immunization: Secondary | ICD-10-CM

## 2013-03-05 DIAGNOSIS — J301 Allergic rhinitis due to pollen: Secondary | ICD-10-CM

## 2013-03-05 DIAGNOSIS — J45998 Other asthma: Secondary | ICD-10-CM

## 2013-03-05 DIAGNOSIS — J45909 Unspecified asthma, uncomplicated: Secondary | ICD-10-CM

## 2013-03-05 MED ORDER — BUDESONIDE-FORMOTEROL FUMARATE 160-4.5 MCG/ACT IN AERO: 2.0000 | INHALATION_SPRAY | Freq: Two times a day (BID) | RESPIRATORY_TRACT | Status: DC

## 2013-03-05 MED ORDER — MONTELUKAST SODIUM 10 MG PO TABS: ORAL_TABLET | ORAL | Status: DC

## 2013-03-05 MED ORDER — ALBUTEROL SULFATE HFA 108 (90 BASE) MCG/ACT IN AERS: 2.0000 | INHALATION_SPRAY | RESPIRATORY_TRACT | Status: DC | PRN

## 2013-03-05 NOTE — Progress Notes (Signed)
Patient ID: [name], male    DOB: May 31, 1946, 67 y.o.   MRN: [id number]  HPI 12/18/10- 9 yo M followed for asthma, allergic rhinitis complicated by hx ADHD, CAD/ MI, GERD Last here June 21, 2010  Continues allergy vaccine 1:2 GH. In last 2-3 weeks, if he rushes or gets tense, he coughs and gets chest pains, especially going in and out of heat. Describes the pains as an airway soreness with some chest muscle soreness with increased raspy cough. Wife notes his raspy throat clearing. Mostly chest and throat, with persistent, "controlled" nasal drainage. He does garden and maintain yard. Has trouble remembering Symbicort.   [birth year]65 yo M never smoker followed for asthma, allergic rhinitis complicated by hx ADHD, CAD/ MI, GERD Had flu vaccine. He notices what feel like chest wall pains while lying in bed, in cold air or after exertion. He tends to cough for about 15 minutes after he first lies down, nonproductive and without awareness of reflux or postnasal drip. Aware of bruxism during the daytime which he will discuss with his dentist.  12/03/11- 44 yo M never smoker followed for asthma, allergic rhinitis complicated by hx ADHD, CAD/ MI, GERD Hoarseness, cough-non productive x 1.5 weeks; also has chest tightness. Uncomfortable singing so as stayed out of choir practice for the past 2 weeks feeling course with some dry cough and chest tightness. Illness began with a headache. Had taken a Z-Pak and Mucinex DM and Biaxin. Benzonatate may have helped a little. He has now finished a prednisone taper. No purulent sputum, bad sore throat or fever. Wife has remained well with no obvious viral syndrome in the family. CXR 10/14/11- stable, negative except mild cardiac enlargement. Continues allergy vaccine 1: 2, GH.  03/05/13- 51 yo M never smoker followed for asthma, allergic rhinitis complicated by hx ADHD, CAD/ MI, GERD FOLLOWS FOR: still on Allergy vaccine 1:2 GH and doing well. Would like to  go back on Symbicort instead of Dulera as it costs the same amount No need for rescue inhaler in a long time. Dropped off of acid blocker, comfortable. CXR 10/06/12 IMPRESSION:  No significant abnormality.  Original Report Authenticated By: Francene Boyers, M.D.  Review of Systems-see HPI Constitutional:   No-   weight loss, night sweats, fevers, chills, fatigue, lassitude. HEENT:   No-current  headaches, difficulty swallowing, tooth/dental problems, sore throat,       No-  sneezing, itching, ear ache, no-nasal congestion, post nasal drip,  CV:  No-chest pain, No- orthopnea, PND, swelling in lower extremities, anasarca, dizziness, palpitations Resp: No-   shortness of breath with exertion or at rest.              No-   productive cough,  No- non-productive cough,  No- coughing up of blood.              No-   change in color of mucus.  No- wheezing.   Skin: No-   rash or lesions. GI:  No-   heartburn, indigestion, abdominal pain, nausea, vomiting,  GU:  MS:  No-   joint pain or swelling. Neuro-     nothing unusual Psych:  No- change in mood or affect. No depression or anxiety.  No memory loss.     Objective:   Physical Exam General- Alert, Oriented, Affect-appropriate, Distress- none acute Skin- rash-none, lesions- none, excoriation- none Lymphadenopathy- none Head- atraumatic            Eyes- Gross vision intact,  PERRLA, conjunctivae clear secretions            Ears- Hearing, canals-normal            Nose- Clear, no-Septal dev, mucus, polyps, erosion, perforation             Throat- Mallampati II , mucosa clear, drainage- none, tonsils- atrophic. Frequent throat clearing Neck- flexible , trachea midline, no stridor , thyroid nl, carotid no bruit Chest - symmetrical excursion , unlabored           Heart/CV- RRR , no murmur , no gallop  , no rub, nl s1 s2                           - JVD- none , edema- none, stasis changes- none, varices- none           Lung- clear to P&A, good  airflow,wheeze- none, cough- none , dullness-none, rub- none           Chest wall-  Abd- Br/ Gen/ Rectal- Not done, not indicated Extrem- +right little finger is splinted after a fall Neuro- grossly intact to observation

## 2013-03-05 NOTE — Patient Instructions (Addendum)
Flu vax  Sample/ script Symbicort 160   2 puffs then rinse mouth, twice daily    This replaces Dulera  Script refill Ventolin HFA  We can continue allergy vaccine  Please call as needed

## 2013-03-10 ENCOUNTER — Ambulatory Visit (INDEPENDENT_AMBULATORY_CARE_PROVIDER_SITE_OTHER): Payer: Medicare Other

## 2013-03-10 DIAGNOSIS — J309 Allergic rhinitis, unspecified: Secondary | ICD-10-CM

## 2013-03-11 ENCOUNTER — Ambulatory Visit: Payer: Medicare Other

## 2013-03-16 ENCOUNTER — Encounter: Payer: Self-pay | Admitting: Internal Medicine

## 2013-03-16 ENCOUNTER — Ambulatory Visit (INDEPENDENT_AMBULATORY_CARE_PROVIDER_SITE_OTHER): Payer: Medicare Other

## 2013-03-16 DIAGNOSIS — J309 Allergic rhinitis, unspecified: Secondary | ICD-10-CM

## 2013-03-16 NOTE — Assessment & Plan Note (Signed)
He tolerates high concentration allergy vaccine with no adverse problems. He continues to feel it helps him.

## 2013-03-18 ENCOUNTER — Ambulatory Visit: Payer: Medicare Other

## 2013-03-20 ENCOUNTER — Other Ambulatory Visit: Payer: Self-pay | Admitting: Family Medicine

## 2013-03-22 NOTE — Telephone Encounter (Signed)
IS THIS OK 

## 2013-03-22 NOTE — Telephone Encounter (Signed)
Okay to renew

## 2013-03-24 ENCOUNTER — Ambulatory Visit (INDEPENDENT_AMBULATORY_CARE_PROVIDER_SITE_OTHER): Payer: Medicare Other

## 2013-03-24 DIAGNOSIS — J309 Allergic rhinitis, unspecified: Secondary | ICD-10-CM

## 2013-03-27 ENCOUNTER — Other Ambulatory Visit: Payer: Self-pay | Admitting: Family Medicine

## 2013-03-29 NOTE — Telephone Encounter (Signed)
IS THIS OK 

## 2013-03-29 NOTE — Telephone Encounter (Signed)
This was renewed on October 11. Find out why they are sending it through again

## 2013-03-31 ENCOUNTER — Ambulatory Visit: Payer: Medicare Other

## 2013-04-07 ENCOUNTER — Ambulatory Visit (INDEPENDENT_AMBULATORY_CARE_PROVIDER_SITE_OTHER): Payer: Medicare Other

## 2013-04-07 DIAGNOSIS — J309 Allergic rhinitis, unspecified: Secondary | ICD-10-CM

## 2013-04-14 ENCOUNTER — Ambulatory Visit: Payer: Medicare Other

## 2013-04-15 ENCOUNTER — Ambulatory Visit (INDEPENDENT_AMBULATORY_CARE_PROVIDER_SITE_OTHER): Payer: Medicare Other

## 2013-04-15 DIAGNOSIS — J309 Allergic rhinitis, unspecified: Secondary | ICD-10-CM

## 2013-04-21 ENCOUNTER — Ambulatory Visit (INDEPENDENT_AMBULATORY_CARE_PROVIDER_SITE_OTHER): Payer: Medicare Other

## 2013-04-21 DIAGNOSIS — J309 Allergic rhinitis, unspecified: Secondary | ICD-10-CM

## 2013-04-22 ENCOUNTER — Ambulatory Visit: Payer: Medicare Other

## 2013-04-28 ENCOUNTER — Ambulatory Visit: Payer: Medicare Other

## 2013-04-29 ENCOUNTER — Ambulatory Visit (INDEPENDENT_AMBULATORY_CARE_PROVIDER_SITE_OTHER): Payer: Medicare Other

## 2013-04-29 DIAGNOSIS — J309 Allergic rhinitis, unspecified: Secondary | ICD-10-CM

## 2013-05-05 ENCOUNTER — Ambulatory Visit (INDEPENDENT_AMBULATORY_CARE_PROVIDER_SITE_OTHER): Payer: Medicare Other

## 2013-05-05 DIAGNOSIS — J309 Allergic rhinitis, unspecified: Secondary | ICD-10-CM

## 2013-05-10 ENCOUNTER — Telehealth: Payer: Self-pay | Admitting: Family Medicine

## 2013-05-10 MED ORDER — METHYLPHENIDATE HCL ER (OSM) 36 MG PO TBCR: 36.0000 mg | EXTENDED_RELEASE_TABLET | Freq: Every day | ORAL | Status: DC

## 2013-05-10 MED ORDER — METHYLPHENIDATE HCL ER (OSM) 54 MG PO TBCR: 54.0000 mg | EXTENDED_RELEASE_TABLET | ORAL | Status: DC

## 2013-05-10 NOTE — Addendum Note (Signed)
Addended by: Ronnald Nian on: 05/10/2013 01:12 PM   Modules accepted: Orders

## 2013-05-10 NOTE — Telephone Encounter (Signed)
Concerta renewed 

## 2013-05-10 NOTE — Telephone Encounter (Signed)
36 mg prescription was made in error

## 2013-05-10 NOTE — Telephone Encounter (Signed)
Pt needs refill on concerta/call when ready

## 2013-05-12 ENCOUNTER — Ambulatory Visit (INDEPENDENT_AMBULATORY_CARE_PROVIDER_SITE_OTHER): Payer: Medicare Other

## 2013-05-12 DIAGNOSIS — J309 Allergic rhinitis, unspecified: Secondary | ICD-10-CM

## 2013-05-19 ENCOUNTER — Ambulatory Visit (INDEPENDENT_AMBULATORY_CARE_PROVIDER_SITE_OTHER): Payer: Medicare Other

## 2013-05-19 DIAGNOSIS — J309 Allergic rhinitis, unspecified: Secondary | ICD-10-CM

## 2013-05-26 ENCOUNTER — Ambulatory Visit: Payer: Medicare Other

## 2013-05-28 ENCOUNTER — Ambulatory Visit (INDEPENDENT_AMBULATORY_CARE_PROVIDER_SITE_OTHER): Payer: Medicare Other

## 2013-05-28 DIAGNOSIS — J309 Allergic rhinitis, unspecified: Secondary | ICD-10-CM

## 2013-06-01 ENCOUNTER — Ambulatory Visit (INDEPENDENT_AMBULATORY_CARE_PROVIDER_SITE_OTHER): Payer: Medicare Other

## 2013-06-01 DIAGNOSIS — J309 Allergic rhinitis, unspecified: Secondary | ICD-10-CM

## 2013-06-11 ENCOUNTER — Encounter: Payer: Self-pay | Admitting: Internal Medicine

## 2013-06-17 ENCOUNTER — Ambulatory Visit (INDEPENDENT_AMBULATORY_CARE_PROVIDER_SITE_OTHER): Payer: Medicare Other

## 2013-06-17 DIAGNOSIS — J309 Allergic rhinitis, unspecified: Secondary | ICD-10-CM

## 2013-06-24 ENCOUNTER — Ambulatory Visit (INDEPENDENT_AMBULATORY_CARE_PROVIDER_SITE_OTHER): Payer: Medicare Other

## 2013-06-24 DIAGNOSIS — J309 Allergic rhinitis, unspecified: Secondary | ICD-10-CM

## 2013-06-30 ENCOUNTER — Ambulatory Visit (INDEPENDENT_AMBULATORY_CARE_PROVIDER_SITE_OTHER): Payer: Medicare Other

## 2013-06-30 DIAGNOSIS — J309 Allergic rhinitis, unspecified: Secondary | ICD-10-CM

## 2013-07-07 ENCOUNTER — Ambulatory Visit: Payer: Medicare Other

## 2013-07-07 ENCOUNTER — Other Ambulatory Visit: Payer: Self-pay | Admitting: Family Medicine

## 2013-07-08 NOTE — Telephone Encounter (Signed)
Is it ok to fill? Last appt was 08/2012, she has no future appts with you.

## 2013-07-12 ENCOUNTER — Telehealth: Payer: Self-pay | Admitting: Internal Medicine

## 2013-07-12 ENCOUNTER — Telehealth: Payer: Self-pay | Admitting: Family Medicine

## 2013-07-12 MED ORDER — METHYLPHENIDATE HCL ER (OSM) 54 MG PO TBCR: 54.0000 mg | EXTENDED_RELEASE_TABLET | ORAL | Status: DC

## 2013-07-12 NOTE — Telephone Encounter (Signed)
Pt notified rx was ready for pick up. One rx for concerta

## 2013-07-12 NOTE — Telephone Encounter (Signed)
Pt called for refill of Concerta, pt scheduled cpe 07/23/13.  Please advise pt when done [phone]

## 2013-07-14 ENCOUNTER — Ambulatory Visit (INDEPENDENT_AMBULATORY_CARE_PROVIDER_SITE_OTHER): Payer: Medicare Other

## 2013-07-14 DIAGNOSIS — J309 Allergic rhinitis, unspecified: Secondary | ICD-10-CM

## 2013-07-21 ENCOUNTER — Ambulatory Visit (INDEPENDENT_AMBULATORY_CARE_PROVIDER_SITE_OTHER): Payer: Medicare Other

## 2013-07-21 DIAGNOSIS — J309 Allergic rhinitis, unspecified: Secondary | ICD-10-CM

## 2013-07-23 ENCOUNTER — Encounter: Payer: Self-pay | Admitting: Family Medicine

## 2013-07-23 ENCOUNTER — Ambulatory Visit (INDEPENDENT_AMBULATORY_CARE_PROVIDER_SITE_OTHER): Payer: Medicare Other | Admitting: Family Medicine

## 2013-07-23 VITALS — BP 102/64 | HR 76 | Ht 63.0 in | Wt 173.0 lb

## 2013-07-23 DIAGNOSIS — K219 Gastro-esophageal reflux disease without esophagitis: Secondary | ICD-10-CM

## 2013-07-23 DIAGNOSIS — F909 Attention-deficit hyperactivity disorder, unspecified type: Secondary | ICD-10-CM

## 2013-07-23 DIAGNOSIS — F418 Other specified anxiety disorders: Secondary | ICD-10-CM

## 2013-07-23 DIAGNOSIS — E785 Hyperlipidemia, unspecified: Secondary | ICD-10-CM

## 2013-07-23 DIAGNOSIS — F341 Dysthymic disorder: Secondary | ICD-10-CM

## 2013-07-23 DIAGNOSIS — I10 Essential (primary) hypertension: Secondary | ICD-10-CM

## 2013-07-23 DIAGNOSIS — E291 Testicular hypofunction: Secondary | ICD-10-CM

## 2013-07-23 DIAGNOSIS — J301 Allergic rhinitis due to pollen: Secondary | ICD-10-CM

## 2013-07-23 DIAGNOSIS — Z125 Encounter for screening for malignant neoplasm of prostate: Secondary | ICD-10-CM

## 2013-07-23 DIAGNOSIS — I2510 Atherosclerotic heart disease of native coronary artery without angina pectoris: Secondary | ICD-10-CM

## 2013-07-23 LAB — LIPID PANEL
CHOLESTEROL: 98 mg/dL (ref 0–200)
HDL: 48 mg/dL (ref >39)
LDL CALC: 39 mg/dL (ref 0–99)
TRIGLYCERIDES: 56 mg/dL (ref <150)
Total CHOL/HDL Ratio: 2 Ratio
VLDL: 11 mg/dL (ref 0–40)

## 2013-07-23 LAB — CBC WITH DIFFERENTIAL/PLATELET
BASOS ABS: 0 10*3/uL (ref 0.0–0.1)
BASOS PCT: 0 % (ref 0–1)
Eosinophils Absolute: 0.1 10*3/uL (ref 0.0–0.7)
Eosinophils Relative: 2 % (ref 0–5)
HEMATOCRIT: 30.6 % — AB (ref 39.0–52.0)
HEMOGLOBIN: 10.4 g/dL — AB (ref 13.0–17.0)
Lymphocytes Relative: 28 % (ref 12–46)
Lymphs Abs: 2 10*3/uL (ref 0.7–4.0)
MCH: 27.6 pg (ref 26.0–34.0)
MCHC: 34 g/dL (ref 30.0–36.0)
MCV: 81.2 fL (ref 78.0–100.0)
MONO ABS: 0.6 10*3/uL (ref 0.1–1.0)
MONOS PCT: 8 % (ref 3–12)
Neutro Abs: 4.6 10*3/uL (ref 1.7–7.7)
Neutrophils Relative %: 62 % (ref 43–77)
Platelets: 251 10*3/uL (ref 150–400)
RBC: 3.77 MIL/uL — ABNORMAL LOW (ref 4.22–5.81)
RDW: 16.4 % — AB (ref 11.5–15.5)
WBC: 7.4 10*3/uL (ref 4.0–10.5)

## 2013-07-23 LAB — COMPREHENSIVE METABOLIC PANEL
ALK PHOS: 73 U/L (ref 39–117)
ALT: 31 U/L (ref 0–53)
AST: 27 U/L (ref 0–37)
Albumin: 4.5 g/dL (ref 3.5–5.2)
BILIRUBIN TOTAL: 0.3 mg/dL (ref 0.2–1.2)
BUN: 9 mg/dL (ref 6–23)
CO2: 26 mEq/L (ref 19–32)
Calcium: 9.3 mg/dL (ref 8.4–10.5)
Chloride: 107 mEq/L (ref 96–112)
Creat: 0.93 mg/dL (ref 0.50–1.35)
GLUCOSE: 103 mg/dL — AB (ref 70–99)
Potassium: 3.9 mEq/L (ref 3.5–5.3)
Sodium: 142 mEq/L (ref 135–145)
Total Protein: 7 g/dL (ref 6.0–8.3)

## 2013-07-23 NOTE — Progress Notes (Signed)
Subjective:    Patient ID: [name], male    DOB: November 26, 1945, 68 y.o.   MRN: [id number]  HPI He is here for a general checkup. He does have underlying allergies and presently is on immunotherapy. He states that his allergy symptoms are better. He does note difficulty with breathing especially with cold air. He is followed intermittently by Dr. Maple HudsonYoung. He also has underlying coronary disease and is followed by cardiology. He has had an MI and does have a stent present. He has a history of depression and presently is doing well on Pristiq and bupropion. He does intermittently run into trouble with reflux symptoms but is doing well on Zegerid. He has not been on any testosterone replacement in quite some time. He continues to do well on Concerta. He is married and retired. Family and social history was otherwise reviewed.  Review of Systems  All other systems reviewed and are negative.       Objective:   Physical Exam  BP 102/64  Pulse 76  Ht 5\' 3"  (1.6 m)  Wt 173 lb (78.472 kg)  BMI 30.65 kg/m2  General Appearance:    Alert, cooperative, no distress, appears stated age  Head:    Normocephalic, without obvious abnormality, atraumatic  Eyes:    PERRL, conjunctiva/corneas clear, EOM's intact, fundi    benign  Ears:    Normal TM's and external ear canals  Nose:   Nares normal, mucosa normal, no drainage or sinus   tenderness  Throat:   Lips, mucosa, and tongue normal; teeth and gums normal  Neck:   Supple, no lymphadenopathy;  thyroid:  no   enlargement/tenderness/ nodules; no carotid   bruit or JVD  Back:    Spine nontender, no curvature, ROM normal, no CVA     tenderness  Lungs:     Clear to auscultation bilaterally without wheezes, rales or     ronchi; respirations unlabored  Chest Wall:    No tenderness or deformity   Heart:    Regular rate and rhythm, S1 and S2 normal, no murmur, rub   or gallop  Breast Exam:    No chest wall tenderness, masses or gynecomastia    Abdomen:     Soft, non-tender, nondistended, normoactive bowel sounds,    no masses, no hepatosplenomegaly  Genitalia:    Normal male external genitalia without lesions.  Testicles without masses.  No inguinal hernias.  Rectal:    deferred. Stool cards given.  Extremities:   No clubbing, cyanosis or edema  Pulses:   2+ and symmetric all extremities  Skin:   Skin color, texture, turgor normal, no rashes or lesions  Lymph nodes:   Cervical, supraclavicular, and axillary nodes normal  Neurologic:   CNII-XII intact, normal strength, sensation and gait; reflexes 2+ and symmetric throughout          Psych:   Normal mood, affect, hygiene and grooming.         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 Discussed routine followup with me and using cardiology less frequently since he is fairly stable. Also mentioned me following his pulmonary symptoms but continuing with his immunotherapy. Advanced directive was discussed and he does have one in place.

## 2013-07-24 LAB — PSA: PSA: 1.48 ng/mL (ref <=4.00)

## 2013-07-24 LAB — TESTOSTERONE: Testosterone: 350 ng/dL (ref 300–890)

## 2013-07-26 ENCOUNTER — Telehealth: Payer: Self-pay | Admitting: Family Medicine

## 2013-07-26 MED ORDER — METHYLPHENIDATE HCL ER (OSM) 54 MG PO TBCR: 54.0000 mg | EXTENDED_RELEASE_TABLET | ORAL | Status: DC

## 2013-07-26 NOTE — Telephone Encounter (Signed)
Concerta for 2 more months was written.

## 2013-07-26 NOTE — Telephone Encounter (Signed)
Called pt [name] re :  concerta

## 2013-07-28 ENCOUNTER — Ambulatory Visit (INDEPENDENT_AMBULATORY_CARE_PROVIDER_SITE_OTHER): Payer: Medicare Other

## 2013-07-28 DIAGNOSIS — J309 Allergic rhinitis, unspecified: Secondary | ICD-10-CM

## 2013-07-29 ENCOUNTER — Ambulatory Visit (INDEPENDENT_AMBULATORY_CARE_PROVIDER_SITE_OTHER): Payer: Medicare Other

## 2013-07-29 ENCOUNTER — Encounter: Payer: Self-pay | Admitting: Family Medicine

## 2013-07-29 DIAGNOSIS — J309 Allergic rhinitis, unspecified: Secondary | ICD-10-CM

## 2013-07-29 NOTE — Addendum Note (Signed)
Addended by: Ronnald NianLALONDE, JOHN C on: 07/29/2013 03:56 PM   Modules accepted: Level of Service

## 2013-08-02 ENCOUNTER — Encounter: Payer: Self-pay | Admitting: Cardiology

## 2013-08-02 NOTE — Progress Notes (Unsigned)
Patient ID: [name], male   DOB: 1945-08-13, 68 y.o.   MRN: [id number]  [name]  Date of visit:  08/02/2013 DOB:  [birth year]    Age:  68 yrs. Medical record number:  [id number]     Account number:  [id number] Primary Care Provider: Sharlot Gowda C ____________________________ CURRENT DIAGNOSES  1. CAD,Native  2. Hyperlipidemia  3. Mild Recurrent Major Depression  4. Atherosclerosis Nos  5. Ankylosing Spondylitis  6. Surgery-PTCA ____________________________ ALLERGIES  No Known Drug Allergies ____________________________ MEDICATIONS  1. meclizine 25 mg tablet, PRN  2. Multi-Day tablet, 1 p.o. daily  3. Calcium 600 600 mg (1,500 mg) tablet, 1 p.o. daily  4. alprazolam 0.5 mg tablet, PRN  5. Glucosamine 500 mg tablet, 1 p.o. daily  6. Singulair 10 mg tablet, 1 p.o. daily  7. Mucinex 600 mg tablet extended release, PRN  8. Symbicort 160-4.5 mcg/Actuation HFA Aerosol Inhaler, 2puff bid to tid  9. Pristiq 50 mg Tablet Sustained Release 24 hr, 1 p.o. daily  10. Carafate 100 mg/mL Suspension, 1ml   tid  11. Ventolin 90 mcg/Actuation Aerosol Inhaler, Daily  12. bupropion HCl 300 mg Tablet Extended Release 24 hr, 1 p.o. daily  13. Concerta 54 mg Tablet Extended Rel 24 hr, 1 p.o. daily  14. benzonatate 100 mg capsule, PRN  15. Dulera 100-5 mcg/actuation HFA aerosol inhaler, 2 puffs bid  16. Zyrtec 10 mg tablet, 1 p.o. daily  17. clopidogrel 75 mg tablet, 1 p.o. daily  18. aspirin 81 mg tablet,chewable, 1 p.o. daily  19. nitroglycerin 0.4 mg sublingual tablet, PRN  20. Niaspan 750 mg tablet,extended release, 2 qd  21. diltiazem ER 240 mg capsule,extended release, 1 p.o. daily  22. atorvastatin 80 mg tablet, 1 p.o. daily ____________________________ CHIEF COMPLAINTS  Followup of CAD,Native  Followup of Hyperlipidemia ____________________________ HISTORY OF PRESENT ILLNESS Patient seen for cardiac followup. He is just getting over an upper respiratory infection and  still has issues with hoarseness. He is not currently having angina but does note occasional pressure in his chest if he breathes cold air in. He still has a lot of issues with arthritis and low back pain. His lipids were evaluated today and show excellent control. He has no PND, orthopnea, syncope, or claudication. He is not really having much in the way of peripheral edema at the present time. 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, CAD;  Surgical Procedures:  cholecystectomy (lap), tonsillectomy, vasectomy, nose surg, rt hand joint reconstruction, herniorrhaphy, laminectomy lumbar, foot surgery;  Cardiology Procedures-Invasive:  PTCA of the RCA December 1995, stent RCA December 1999, cardiac cath (left) July 2009;  Cardiology Procedures-Noninvasive:  treadmill cardiolite August 2012, echocardiogram May 2014;  Cardiac Cath Results:  normal Left main, 60% stenosis proximal LAD, 90% stenosis ostial Diag 1, luminal irregualrities CFX, widely patent RCA stent site, 50% stenosis proximal RCA;  LVEF of 45% documented via echocardiogram on 10/14/2012,   ____________________________ CARDIO-PULMONARY TEST DATES EKG Date:  08/02/2013;   Cardiac Cath Date:  02/07/2005;  Stent Placement Date: 05/11/1998;  Nuclear Study Date:  01/17/2011;  Echocardiography Date: 10/14/2012;  Chest Xray Date: 10/06/2012;   ____________________________ FAMILY HISTORY Father -- Coronary Artery Disease, Dementia/Alzheimer's, Father alive with problem Mother -- Diabetes mellitus, Dementia/Alzheimer's, Mother dead Sister -- Coronary Artery Disease, Sister alive with problem ____________________________ SOCIAL HISTORY Alcohol Use:  drinks occasionally;  Smoking:  never smoked;  Diet:  fat modified diet;  Lifestyle:   married and 2 children;  Exercise:  some exercise;  Occupation:  Arts development officerhuman resource mgr, [employer], retired and does Catering managerconsulting on the side;  Residence:  lives with wife;   ____________________________ REVIEW OF SYSTEMS General:  weight loss of approximately 5 lbs Ears, Nose, Throat, Mouth:  seasonal sinusitis, chronic hoarseness Respiratory: mild dyspnea with exertion Cardiovascular:  please review HPI Abdominal: denies dyspepsia, GI bleeding, constipation, or diarrhea Musculoskeletal:  chronic low back pain, arthritis of the right hip, recent injury to finger Neurological:  occasional vertigo Psychiatric:  anxiety, situational stress  ____________________________ PHYSICAL EXAMINATION VITAL SIGNS  Blood Pressure:  120/60 Sitting, Left arm, regular cuff  , 114/60 Standing, Left arm and regular cuff   Pulse:  82/min. Weight:  173.00 lbs. Height:  66"BMI: 28  Constitutional:  pleasant white male in no acute distress Skin:  warm and dry to touch, no apparent skin lesions, or masses noted. Head:  normocephalic, normal hair pattern, no masses or tenderness ENT:  ears, nose and throat reveal no gross abnormalities.  Dentition good. Neck:  supple, no masses, thyromegaly, JVD. Carotid pulses are full and equal bilaterally without bruits. Chest:  normal symmetry, clear to auscultation Cardiac:  regular rhythm, normal S1 and S2, No S3 or S4, no murmurs, gallops or rubs detected. Peripheral Pulses:  the femoral,dorsalis pedis, and posterior tibial pulses are full and equal bilaterally with no bruits auscultated. Extremities & Back:  no deformities, clubbing, cyanosis, erythema or edema observed. Normal muscle strength and tone. Neurological:  no gross motor or sensory deficits noted, affect appropriate, oriented x3. ____________________________ MOST RECENT LIPID PANEL 08/02/13  CHOL TOTL 122 mg/dl, LDL 43 NM, HDL 58 mg/dl, TRIGLYCER 161104 mg/dl and CHOL/HDL 2.1  (Calc) ____________________________ IMPRESSIONS/PLAN  1. Coronary artery disease with previous stenting of the right coronary artery 2. Hypertension controlled 3. Hyperlipidemia under excellent control 4. Peripheral edema due to venous insufficiency that has resolved  Recommendations:  Return visit in 6 months. His EKG shows sinus rhythm today. Call if recurrent problems. ____________________________ TODAYS ORDERS  1. 12 Lead EKG: Today  2. Return Visit: 6 months                       ____________________________ Cardiology Physician:  Darden PalmerW. Spencer Tilley, Jr. MD [facility]

## 2013-08-04 ENCOUNTER — Other Ambulatory Visit: Payer: Self-pay | Admitting: Internal Medicine

## 2013-08-04 ENCOUNTER — Ambulatory Visit: Payer: Medicare Other

## 2013-08-04 DIAGNOSIS — D509 Iron deficiency anemia, unspecified: Secondary | ICD-10-CM

## 2013-08-06 NOTE — Progress Notes (Signed)
His last hemoglobin was low. He has a previous history of iron deficiency and has been on iron. I will followup with CBC, iron and TIBC.

## 2013-08-11 ENCOUNTER — Ambulatory Visit (INDEPENDENT_AMBULATORY_CARE_PROVIDER_SITE_OTHER): Payer: Medicare Other

## 2013-08-11 DIAGNOSIS — J309 Allergic rhinitis, unspecified: Secondary | ICD-10-CM

## 2013-08-12 ENCOUNTER — Other Ambulatory Visit (INDEPENDENT_AMBULATORY_CARE_PROVIDER_SITE_OTHER): Payer: Medicare Other

## 2013-08-12 DIAGNOSIS — D509 Iron deficiency anemia, unspecified: Secondary | ICD-10-CM

## 2013-08-12 LAB — CBC WITH DIFFERENTIAL/PLATELET
Basophils Absolute: 0.1 10*3/uL (ref 0.0–0.1)
Basophils Relative: 1 % (ref 0–1)
EOS ABS: 0.2 10*3/uL (ref 0.0–0.7)
EOS PCT: 3 % (ref 0–5)
HCT: 32.2 % — ABNORMAL LOW (ref 39.0–52.0)
Hemoglobin: 10.6 g/dL — ABNORMAL LOW (ref 13.0–17.0)
LYMPHS ABS: 2.1 10*3/uL (ref 0.7–4.0)
Lymphocytes Relative: 37 % (ref 12–46)
MCH: 27 pg (ref 26.0–34.0)
MCHC: 32.9 g/dL (ref 30.0–36.0)
MCV: 82.1 fL (ref 78.0–100.0)
Monocytes Absolute: 0.4 10*3/uL (ref 0.1–1.0)
Monocytes Relative: 8 % (ref 3–12)
Neutro Abs: 2.9 10*3/uL (ref 1.7–7.7)
Neutrophils Relative %: 51 % (ref 43–77)
PLATELETS: 336 10*3/uL (ref 150–400)
RBC: 3.92 MIL/uL — AB (ref 4.22–5.81)
RDW: 16.6 % — ABNORMAL HIGH (ref 11.5–15.5)
WBC: 5.6 10*3/uL (ref 4.0–10.5)

## 2013-08-12 LAB — IRON AND TIBC
%SAT: 68 % — AB (ref 20–55)
Iron: 262 ug/dL — ABNORMAL HIGH (ref 42–165)
TIBC: 386 ug/dL (ref 215–435)
UIBC: 124 ug/dL — ABNORMAL LOW (ref 125–400)

## 2013-08-18 ENCOUNTER — Ambulatory Visit (INDEPENDENT_AMBULATORY_CARE_PROVIDER_SITE_OTHER): Payer: Medicare Other

## 2013-08-18 DIAGNOSIS — J309 Allergic rhinitis, unspecified: Secondary | ICD-10-CM

## 2013-08-26 LAB — POC HEMOCCULT BLD/STL (HOME/3-CARD/SCREEN)
Card #3 Fecal Occult Blood, POC: NEGATIVE
FECAL OCCULT BLD: NEGATIVE
Fecal Occult Blood, POC: NEGATIVE

## 2013-08-28 ENCOUNTER — Other Ambulatory Visit: Payer: Self-pay | Admitting: Family Medicine

## 2013-08-30 NOTE — Telephone Encounter (Signed)
Okay to renew

## 2013-08-30 NOTE — Telephone Encounter (Signed)
Is this ok to refill?  

## 2013-08-31 NOTE — Telephone Encounter (Signed)
Medication called in 

## 2013-09-01 ENCOUNTER — Ambulatory Visit (INDEPENDENT_AMBULATORY_CARE_PROVIDER_SITE_OTHER): Payer: Medicare Other

## 2013-09-01 DIAGNOSIS — J309 Allergic rhinitis, unspecified: Secondary | ICD-10-CM

## 2013-09-03 ENCOUNTER — Other Ambulatory Visit: Payer: Self-pay | Admitting: Family Medicine

## 2013-09-03 NOTE — Telephone Encounter (Signed)
Is this ok to refill?  

## 2013-09-05 NOTE — Telephone Encounter (Signed)
This was only taking care of on 324. Find out why there is a discrepancy

## 2013-09-06 NOTE — Telephone Encounter (Signed)
Pharmacy states that they didn't receive any Rx request on this on 3/24, so I went ahead and had pharmacy refill pt Xanax.

## 2013-09-08 ENCOUNTER — Ambulatory Visit (INDEPENDENT_AMBULATORY_CARE_PROVIDER_SITE_OTHER): Payer: Medicare Other

## 2013-09-08 DIAGNOSIS — J309 Allergic rhinitis, unspecified: Secondary | ICD-10-CM

## 2013-09-10 ENCOUNTER — Other Ambulatory Visit: Payer: Self-pay | Admitting: Family Medicine

## 2013-09-13 NOTE — Telephone Encounter (Signed)
Med never got called in from a couple weeks ago. So i called it in since pt called and pt is almost out

## 2013-09-15 ENCOUNTER — Ambulatory Visit (INDEPENDENT_AMBULATORY_CARE_PROVIDER_SITE_OTHER): Payer: Medicare Other

## 2013-09-15 DIAGNOSIS — J309 Allergic rhinitis, unspecified: Secondary | ICD-10-CM

## 2013-09-22 ENCOUNTER — Ambulatory Visit: Payer: Medicare Other

## 2013-09-23 ENCOUNTER — Ambulatory Visit (INDEPENDENT_AMBULATORY_CARE_PROVIDER_SITE_OTHER): Payer: Medicare Other

## 2013-09-23 DIAGNOSIS — J309 Allergic rhinitis, unspecified: Secondary | ICD-10-CM

## 2013-09-29 ENCOUNTER — Ambulatory Visit: Payer: Medicare Other

## 2013-09-30 ENCOUNTER — Ambulatory Visit (INDEPENDENT_AMBULATORY_CARE_PROVIDER_SITE_OTHER): Payer: Medicare Other

## 2013-09-30 DIAGNOSIS — J309 Allergic rhinitis, unspecified: Secondary | ICD-10-CM

## 2013-10-05 ENCOUNTER — Other Ambulatory Visit: Payer: Self-pay | Admitting: Family Medicine

## 2013-10-05 NOTE — Telephone Encounter (Signed)
Is this okay to refill? 

## 2013-10-07 ENCOUNTER — Telehealth: Payer: Self-pay

## 2013-10-07 ENCOUNTER — Ambulatory Visit (INDEPENDENT_AMBULATORY_CARE_PROVIDER_SITE_OTHER): Payer: Medicare Other

## 2013-10-07 DIAGNOSIS — J309 Allergic rhinitis, unspecified: Secondary | ICD-10-CM

## 2013-10-07 NOTE — Telephone Encounter (Signed)
Can you write refills for concerta and is it ok to refill other med

## 2013-10-07 NOTE — Telephone Encounter (Signed)
Pt was notified that med was sent to pharmacy and pt should have concerta from April 16. Pt states he does but he wanted to make sure he didn't run out and i told him to call back towards close to time and we could get that filled for him

## 2013-10-07 NOTE — Telephone Encounter (Signed)
The meclizine was called in on April 28 and he should have a Concerta on April 16.

## 2013-10-07 NOTE — Telephone Encounter (Signed)
Pt states that he needs his 3 refills on Concerta that Dr.Lalonde gives him and he is out of his Meclizine sent to [facility] [address]

## 2013-10-13 ENCOUNTER — Ambulatory Visit: Payer: Medicare Other

## 2013-10-13 ENCOUNTER — Other Ambulatory Visit: Payer: Self-pay | Admitting: Family Medicine

## 2013-10-15 ENCOUNTER — Ambulatory Visit (INDEPENDENT_AMBULATORY_CARE_PROVIDER_SITE_OTHER): Payer: Medicare Other

## 2013-10-15 DIAGNOSIS — J309 Allergic rhinitis, unspecified: Secondary | ICD-10-CM

## 2013-10-19 ENCOUNTER — Ambulatory Visit (INDEPENDENT_AMBULATORY_CARE_PROVIDER_SITE_OTHER): Payer: Medicare Other

## 2013-10-19 DIAGNOSIS — J309 Allergic rhinitis, unspecified: Secondary | ICD-10-CM

## 2013-10-20 ENCOUNTER — Other Ambulatory Visit: Payer: Self-pay | Admitting: Gastroenterology

## 2013-10-20 ENCOUNTER — Ambulatory Visit: Payer: Medicare Other

## 2013-10-27 ENCOUNTER — Ambulatory Visit: Payer: Medicare Other

## 2013-10-29 ENCOUNTER — Ambulatory Visit (INDEPENDENT_AMBULATORY_CARE_PROVIDER_SITE_OTHER): Payer: Medicare Other

## 2013-10-29 DIAGNOSIS — J309 Allergic rhinitis, unspecified: Secondary | ICD-10-CM

## 2013-11-02 ENCOUNTER — Encounter: Payer: Self-pay | Admitting: Internal Medicine

## 2013-11-03 ENCOUNTER — Ambulatory Visit: Payer: Medicare Other

## 2013-11-05 ENCOUNTER — Ambulatory Visit (INDEPENDENT_AMBULATORY_CARE_PROVIDER_SITE_OTHER): Payer: Medicare Other

## 2013-11-05 DIAGNOSIS — J309 Allergic rhinitis, unspecified: Secondary | ICD-10-CM

## 2013-11-08 ENCOUNTER — Telehealth: Payer: Self-pay | Admitting: Family Medicine

## 2013-11-08 MED ORDER — MECLIZINE HCL 25 MG PO TABS: ORAL_TABLET | ORAL | Status: DC

## 2013-11-08 NOTE — Telephone Encounter (Signed)
Meclizine renewed

## 2013-11-09 ENCOUNTER — Encounter: Payer: Self-pay | Admitting: Internal Medicine

## 2013-11-10 ENCOUNTER — Ambulatory Visit (INDEPENDENT_AMBULATORY_CARE_PROVIDER_SITE_OTHER): Payer: Medicare Other

## 2013-11-10 DIAGNOSIS — J309 Allergic rhinitis, unspecified: Secondary | ICD-10-CM

## 2013-11-11 ENCOUNTER — Encounter: Payer: Self-pay | Admitting: Family Medicine

## 2013-11-12 ENCOUNTER — Telehealth: Payer: Self-pay

## 2013-11-12 NOTE — Telephone Encounter (Signed)
Pt states he is needing his refills on Concerta

## 2013-11-15 ENCOUNTER — Telehealth: Payer: Self-pay | Admitting: Family Medicine

## 2013-11-15 MED ORDER — METHYLPHENIDATE HCL ER (OSM) 54 MG PO TBCR: 54.0000 mg | EXTENDED_RELEASE_TABLET | ORAL | Status: DC

## 2013-11-15 MED ORDER — CONCERTA 54 MG PO TBCR: 54.0000 mg | EXTENDED_RELEASE_TABLET | ORAL | Status: DC

## 2013-11-15 NOTE — Telephone Encounter (Signed)
lm

## 2013-11-17 ENCOUNTER — Ambulatory Visit (INDEPENDENT_AMBULATORY_CARE_PROVIDER_SITE_OTHER): Payer: Medicare Other

## 2013-11-17 DIAGNOSIS — J309 Allergic rhinitis, unspecified: Secondary | ICD-10-CM

## 2013-11-21 ENCOUNTER — Other Ambulatory Visit: Payer: Self-pay | Admitting: Family Medicine

## 2013-11-22 NOTE — Telephone Encounter (Signed)
Medication called in 

## 2013-11-22 NOTE — Telephone Encounter (Signed)
OK to renew 

## 2013-11-22 NOTE — Telephone Encounter (Signed)
Is this ok to refill?  

## 2013-11-24 ENCOUNTER — Ambulatory Visit (INDEPENDENT_AMBULATORY_CARE_PROVIDER_SITE_OTHER): Payer: Medicare Other

## 2013-11-24 DIAGNOSIS — J309 Allergic rhinitis, unspecified: Secondary | ICD-10-CM

## 2013-12-01 ENCOUNTER — Ambulatory Visit: Payer: Medicare Other

## 2013-12-02 ENCOUNTER — Other Ambulatory Visit: Payer: Self-pay | Admitting: Family Medicine

## 2013-12-02 ENCOUNTER — Ambulatory Visit (INDEPENDENT_AMBULATORY_CARE_PROVIDER_SITE_OTHER): Payer: Medicare Other

## 2013-12-02 DIAGNOSIS — J309 Allergic rhinitis, unspecified: Secondary | ICD-10-CM

## 2013-12-03 NOTE — Telephone Encounter (Signed)
Is this ok to refill?  

## 2013-12-08 ENCOUNTER — Ambulatory Visit (INDEPENDENT_AMBULATORY_CARE_PROVIDER_SITE_OTHER): Payer: Medicare Other

## 2013-12-08 DIAGNOSIS — J309 Allergic rhinitis, unspecified: Secondary | ICD-10-CM

## 2013-12-14 ENCOUNTER — Ambulatory Visit (INDEPENDENT_AMBULATORY_CARE_PROVIDER_SITE_OTHER): Payer: Medicare Other

## 2013-12-14 DIAGNOSIS — J309 Allergic rhinitis, unspecified: Secondary | ICD-10-CM

## 2013-12-15 ENCOUNTER — Ambulatory Visit (INDEPENDENT_AMBULATORY_CARE_PROVIDER_SITE_OTHER): Payer: Medicare Other

## 2013-12-15 DIAGNOSIS — J309 Allergic rhinitis, unspecified: Secondary | ICD-10-CM

## 2013-12-22 ENCOUNTER — Ambulatory Visit (INDEPENDENT_AMBULATORY_CARE_PROVIDER_SITE_OTHER): Payer: Medicare Other

## 2013-12-22 DIAGNOSIS — J309 Allergic rhinitis, unspecified: Secondary | ICD-10-CM

## 2013-12-29 ENCOUNTER — Ambulatory Visit (INDEPENDENT_AMBULATORY_CARE_PROVIDER_SITE_OTHER): Payer: Medicare Other

## 2013-12-29 DIAGNOSIS — J309 Allergic rhinitis, unspecified: Secondary | ICD-10-CM

## 2014-01-05 ENCOUNTER — Ambulatory Visit (INDEPENDENT_AMBULATORY_CARE_PROVIDER_SITE_OTHER): Payer: Medicare Other

## 2014-01-05 DIAGNOSIS — J309 Allergic rhinitis, unspecified: Secondary | ICD-10-CM

## 2014-01-09 ENCOUNTER — Other Ambulatory Visit: Payer: Self-pay | Admitting: Family Medicine

## 2014-01-12 ENCOUNTER — Ambulatory Visit (INDEPENDENT_AMBULATORY_CARE_PROVIDER_SITE_OTHER): Payer: Medicare Other

## 2014-01-12 DIAGNOSIS — J309 Allergic rhinitis, unspecified: Secondary | ICD-10-CM

## 2014-01-19 ENCOUNTER — Ambulatory Visit (INDEPENDENT_AMBULATORY_CARE_PROVIDER_SITE_OTHER): Payer: Medicare Other

## 2014-01-19 DIAGNOSIS — J309 Allergic rhinitis, unspecified: Secondary | ICD-10-CM

## 2014-01-26 ENCOUNTER — Ambulatory Visit (INDEPENDENT_AMBULATORY_CARE_PROVIDER_SITE_OTHER): Payer: Medicare Other

## 2014-01-26 DIAGNOSIS — J309 Allergic rhinitis, unspecified: Secondary | ICD-10-CM

## 2014-02-02 ENCOUNTER — Ambulatory Visit (INDEPENDENT_AMBULATORY_CARE_PROVIDER_SITE_OTHER): Payer: Medicare Other

## 2014-02-02 DIAGNOSIS — J309 Allergic rhinitis, unspecified: Secondary | ICD-10-CM

## 2014-02-09 ENCOUNTER — Ambulatory Visit (INDEPENDENT_AMBULATORY_CARE_PROVIDER_SITE_OTHER): Payer: Medicare Other

## 2014-02-09 ENCOUNTER — Encounter: Payer: Self-pay | Admitting: Internal Medicine

## 2014-02-09 DIAGNOSIS — J309 Allergic rhinitis, unspecified: Secondary | ICD-10-CM

## 2014-02-16 ENCOUNTER — Ambulatory Visit: Payer: Medicare Other

## 2014-02-18 ENCOUNTER — Ambulatory Visit (INDEPENDENT_AMBULATORY_CARE_PROVIDER_SITE_OTHER): Payer: Medicare Other

## 2014-02-18 DIAGNOSIS — J309 Allergic rhinitis, unspecified: Secondary | ICD-10-CM

## 2014-02-22 ENCOUNTER — Telehealth: Payer: Self-pay | Admitting: Family Medicine

## 2014-02-22 MED ORDER — METHYLPHENIDATE HCL ER (OSM) 54 MG PO TBCR: 54.0000 mg | EXTENDED_RELEASE_TABLET | ORAL | Status: DC

## 2014-02-22 NOTE — Telephone Encounter (Signed)
Concerta renewed 

## 2014-02-23 ENCOUNTER — Telehealth: Payer: Self-pay | Admitting: Family Medicine

## 2014-02-23 ENCOUNTER — Ambulatory Visit (INDEPENDENT_AMBULATORY_CARE_PROVIDER_SITE_OTHER): Payer: Medicare Other

## 2014-02-23 DIAGNOSIS — J309 Allergic rhinitis, unspecified: Secondary | ICD-10-CM

## 2014-02-23 DIAGNOSIS — Z23 Encounter for immunization: Secondary | ICD-10-CM

## 2014-02-23 NOTE — Telephone Encounter (Signed)
Called pt advised rx ready to pick up

## 2014-03-02 ENCOUNTER — Ambulatory Visit (INDEPENDENT_AMBULATORY_CARE_PROVIDER_SITE_OTHER): Payer: Medicare Other

## 2014-03-02 DIAGNOSIS — J309 Allergic rhinitis, unspecified: Secondary | ICD-10-CM

## 2014-03-04 ENCOUNTER — Encounter: Payer: Self-pay | Admitting: Internal Medicine

## 2014-03-04 ENCOUNTER — Ambulatory Visit (INDEPENDENT_AMBULATORY_CARE_PROVIDER_SITE_OTHER): Payer: Medicare Other | Admitting: Internal Medicine

## 2014-03-04 VITALS — BP 114/60 | HR 75 | Ht 70.0 in | Wt 177.6 lb

## 2014-03-04 DIAGNOSIS — Z23 Encounter for immunization: Secondary | ICD-10-CM

## 2014-03-04 DIAGNOSIS — J45909 Unspecified asthma, uncomplicated: Secondary | ICD-10-CM

## 2014-03-04 DIAGNOSIS — J45998 Other asthma: Secondary | ICD-10-CM

## 2014-03-04 DIAGNOSIS — K219 Gastro-esophageal reflux disease without esophagitis: Secondary | ICD-10-CM

## 2014-03-04 DIAGNOSIS — J301 Allergic rhinitis due to pollen: Secondary | ICD-10-CM

## 2014-03-04 NOTE — Assessment & Plan Note (Addendum)
Infrequent need for rescue. Continues Symbicort and singulair. Mild intermittent controlled Plan- continue present meds.

## 2014-03-04 NOTE — Patient Instructions (Signed)
Prevnar -13 pneumonia vaccine  We can continue allergy vaccine 1:2  We can continue current meds  Please call as needed

## 2014-03-04 NOTE — Progress Notes (Signed)
Patient ID: [name], male    DOB: 17-Apr-1946, 68 y.o.   MRN: [id number]  HPI 12/18/10- 48 yo M followed for asthma, allergic rhinitis complicated by hx ADHD, CAD/ MI, GERD Last here June 21, 2010  Continues allergy vaccine 1:2 GH. In last 2-3 weeks, if he rushes or gets tense, he coughs and gets chest pains, especially going in and out of heat. Describes the pains as an airway soreness with some chest muscle soreness with increased raspy cough. Wife notes his raspy throat clearing. Mostly chest and throat, with persistent, "controlled" nasal drainage. He does garden and maintain yard. Has trouble remembering Symbicort.   [birth year] yo M never smoker followed for asthma, allergic rhinitis complicated by hx ADHD, CAD/ MI, GERD Had flu vaccine. He notices what feel like chest wall pains while lying in bed, in cold air or after exertion. He tends to cough for about 15 minutes after he first lies down, nonproductive and without awareness of reflux or postnasal drip. Aware of bruxism during the daytime which he will discuss with his dentist.  12/03/11- 57 yo M never smoker followed for asthma, allergic rhinitis complicated by hx ADHD, CAD/ MI, GERD Hoarseness, cough-non productive x 1.5 weeks; also has chest tightness. Uncomfortable singing so as stayed out of choir practice for the past 2 weeks feeling course with some dry cough and chest tightness. Illness began with a headache. Had taken a Z-Pak and Mucinex DM and Biaxin. Benzonatate may have helped a little. He has now finished a prednisone taper. No purulent sputum, bad sore throat or fever. Wife has remained well with no obvious viral syndrome in the family. CXR 10/14/11- stable, negative except mild cardiac enlargement. Continues allergy vaccine 1: 2, GH.  03/05/13- 33 yo M never smoker followed for asthma, allergic rhinitis complicated by hx ADHD, CAD/ MI, GERD FOLLOWS FOR: still on Allergy vaccine 1:2 GH and doing well. Would like to  go back on Symbicort instead of Dulera as it costs the same amount No need for rescue inhaler in a long time. Dropped off of acid blocker, comfortable. CXR 10/06/12 IMPRESSION:  No significant abnormality.  Original Report Authenticated By: Francene Boyers, M.D.  03/04/14- 75 yo M never smoker followed for asthma, allergic rhinitis complicated by hx ADHD, CAD/ MI, GERD, anemia                Allergy vaccine 1:2 GH  FOLLOWS FOR: still on vaccine and doing well; no flare ups. Uses Symbicort prn now. Discussed allergy vaccine, medications, pneumonia vaccine. Good year, well controlled. No concerns voiced.   Review of Systems-see HPI Constitutional:   No-   weight loss, night sweats, fevers, chills, fatigue, lassitude. HEENT:   No-current  headaches, difficulty swallowing, tooth/dental problems, sore throat,       No-  sneezing, itching, ear ache, no-nasal congestion, post nasal drip,  CV:  No-chest pain, No- orthopnea, PND, swelling in lower extremities, anasarca, dizziness, palpitations Resp: No-   shortness of breath with exertion or at rest.              No-   productive cough,  No- non-productive cough,  No- coughing up of blood.              No-   change in color of mucus.  No- wheezing.   Skin: No-   rash or lesions. GI:  No-   heartburn, indigestion, abdominal pain, nausea, vomiting,  GU:  MS:  No-  joint pain or swelling. Neuro-     nothing unusual Psych:  No- change in mood or affect. No depression or anxiety.  No memory loss.     Objective:   Physical Exam General- Alert, Oriented, Affect-appropriate, Distress- none acute Skin- rash-none, lesions- none, excoriation- none Lymphadenopathy- none Head- atraumatic            Eyes- Gross vision intact, PERRLA, conjunctivae clear secretions            Ears- Hearing, canals-normal            Nose- Clear, no-Septal dev, mucus, polyps, erosion, perforation             Throat- Mallampati II , mucosa clear, drainage- none, tonsils-  atrophic.  Neck- flexible , trachea midline, no stridor , thyroid nl, carotid no bruit Chest - symmetrical excursion , unlabored           Heart/CV- RRR , no murmur , no gallop  , no rub, nl s1 s2                           - JVD- none , edema- none, stasis changes- none, varices- none           Lung- clear to P&A, good airflow,wheeze- none, cough- none , dullness-none, rub- none           Chest wall-  Abd- Br/ Gen/ Rectal- Not done, not indicated Extrem- Normal apparent strength and mobility Neuro- grossly intact to observation

## 2014-03-04 NOTE — Assessment & Plan Note (Signed)
Satisfied with control

## 2014-03-04 NOTE — Assessment & Plan Note (Signed)
He feels vaccine is still a good idea and helpful. No reactions.

## 2014-03-09 ENCOUNTER — Ambulatory Visit (INDEPENDENT_AMBULATORY_CARE_PROVIDER_SITE_OTHER): Payer: Medicare Other

## 2014-03-09 DIAGNOSIS — J309 Allergic rhinitis, unspecified: Secondary | ICD-10-CM

## 2014-03-16 ENCOUNTER — Ambulatory Visit (INDEPENDENT_AMBULATORY_CARE_PROVIDER_SITE_OTHER): Payer: Medicare Other

## 2014-03-16 DIAGNOSIS — J309 Allergic rhinitis, unspecified: Secondary | ICD-10-CM

## 2014-03-23 ENCOUNTER — Ambulatory Visit: Payer: Medicare Other

## 2014-03-24 ENCOUNTER — Ambulatory Visit (INDEPENDENT_AMBULATORY_CARE_PROVIDER_SITE_OTHER): Payer: Medicare Other

## 2014-03-24 DIAGNOSIS — J309 Allergic rhinitis, unspecified: Secondary | ICD-10-CM

## 2014-03-25 ENCOUNTER — Ambulatory Visit (INDEPENDENT_AMBULATORY_CARE_PROVIDER_SITE_OTHER): Payer: Medicare Other | Admitting: Family Medicine

## 2014-03-25 ENCOUNTER — Encounter: Payer: Self-pay | Admitting: Family Medicine

## 2014-03-25 VITALS — BP 100/68 | HR 72 | Temp 97.3°F | Ht 69.0 in | Wt 177.0 lb

## 2014-03-25 DIAGNOSIS — H00016 Hordeolum externum left eye, unspecified eyelid: Secondary | ICD-10-CM

## 2014-03-25 MED ORDER — ERYTHROMYCIN 5 MG/GM OP OINT: 1.0000 "application " | TOPICAL_OINTMENT | Freq: Four times a day (QID) | OPHTHALMIC | Status: DC

## 2014-03-25 NOTE — Progress Notes (Signed)
   Subjective:    Patient ID: [name], male    DOB: 1946/02/15, 68 y.o.   MRN: [id number]  Eye Problem  Pertinent negatives include no eye discharge, eye redness, fever, nausea, photophobia or vomiting.    Chief Complaint  Patient presents with  . Eye Problem    left eye has a little pimple-like place on it. Itchy. Has had 3 of these over the last 20 days.    Last night he noticed a white bump on his upper lid.  It feels scratchy and itchy, not painful.    4-5 weeks ago the left eyelid was red, swollen and uncomfortable. He treated it with warm compresses, thinks it developed pustule, and then one morning it was completely better.  He feels like there was a bump (like a grain of sand) that remained there the whole time, never completely resolved--he could feel it when he touched with his finger).  A couple of week ago he had another recurrence of itchy/burning sensation in his left eye, saw a pustule, and the next day it was gone. Last night was the third time it has come back.  He is on immunotherapy for allergies.  He has had some trouble catching his breath over the last week, so he thinks allergies are flaring some.  He admits that he is likely rubbing/itching at his eyes.  Denies any changes in products (soaps, shampoo, etc)  Review of Systems  Constitutional: Negative.  Negative for fever, chills, fatigue and unexpected weight change.  HENT: Positive for postnasal drip and rhinorrhea. Negative for congestion, ear pain, mouth sores, sinus pressure and sneezing.   Eyes: Positive for itching. Negative for photophobia, pain, discharge, redness and visual disturbance.  Respiratory: Positive for cough and shortness of breath. Negative for wheezing.        Some shortness of breath when rushing around, since allergies flaring  Cardiovascular: Negative.   Gastrointestinal: Negative.  Negative for nausea, vomiting, abdominal pain and diarrhea.  Musculoskeletal: Negative for  arthralgias and myalgias.  Allergic/Immunologic: Positive for environmental allergies.  Neurological: Negative for syncope and headaches.       Vertigo frequently  Hematological: Negative.  Negative for adenopathy. Does not bruise/bleed easily.  Psychiatric/Behavioral: Negative.         Objective:   Physical Exam  Constitutional: He appears well-developed and well-nourished. No distress.  HENT:  Head: Normocephalic and atraumatic.  Mouth/Throat: Oropharynx is clear and moist.  Eyes: Conjunctivae are normal. Pupils are equal, round, and reactive to light. Right eye exhibits no discharge. Left eye exhibits no discharge. No scleral icterus.  There is a 1-1.545mm pustule very supericially at the lid margin, just inside the eyelashes on the left upper eyelid.  There is no surrounding erythema, warmth or swelling    BP 100/68  Pulse 72  Temp(Src) 97.3 F (36.3 C) (Tympanic)  Ht 5\' 9"  (1.753 m)  Wt 177 lb (80.287 kg)  BMI 26.13 kg/m2        Assessment & Plan:   Stye, left - Plan: erythromycin ophthalmic ointment  Warm compresses.  Given recurrences, will go ahead and treat with ABX.  Will apply to entire eye at bedtime, more superficially/locally TID during day.  F/U with ophtho if ongoing problems, especially if palpable abnormality persists (ie chalazion)

## 2014-03-25 NOTE — Patient Instructions (Signed)

## 2014-03-29 ENCOUNTER — Encounter: Payer: Self-pay | Admitting: Family Medicine

## 2014-03-30 ENCOUNTER — Ambulatory Visit (INDEPENDENT_AMBULATORY_CARE_PROVIDER_SITE_OTHER): Payer: Medicare Other

## 2014-03-30 DIAGNOSIS — J309 Allergic rhinitis, unspecified: Secondary | ICD-10-CM

## 2014-04-06 ENCOUNTER — Ambulatory Visit (INDEPENDENT_AMBULATORY_CARE_PROVIDER_SITE_OTHER): Payer: Medicare Other

## 2014-04-06 DIAGNOSIS — J309 Allergic rhinitis, unspecified: Secondary | ICD-10-CM

## 2014-04-13 ENCOUNTER — Ambulatory Visit (INDEPENDENT_AMBULATORY_CARE_PROVIDER_SITE_OTHER): Payer: Medicare Other

## 2014-04-13 DIAGNOSIS — J309 Allergic rhinitis, unspecified: Secondary | ICD-10-CM

## 2014-04-15 ENCOUNTER — Ambulatory Visit (INDEPENDENT_AMBULATORY_CARE_PROVIDER_SITE_OTHER): Payer: Medicare Other

## 2014-04-15 DIAGNOSIS — J309 Allergic rhinitis, unspecified: Secondary | ICD-10-CM

## 2014-04-18 ENCOUNTER — Encounter: Payer: Self-pay | Admitting: Internal Medicine

## 2014-04-20 ENCOUNTER — Ambulatory Visit (INDEPENDENT_AMBULATORY_CARE_PROVIDER_SITE_OTHER): Payer: Medicare Other

## 2014-04-20 DIAGNOSIS — J309 Allergic rhinitis, unspecified: Secondary | ICD-10-CM

## 2014-04-27 ENCOUNTER — Ambulatory Visit (INDEPENDENT_AMBULATORY_CARE_PROVIDER_SITE_OTHER): Payer: Medicare Other

## 2014-04-27 DIAGNOSIS — J309 Allergic rhinitis, unspecified: Secondary | ICD-10-CM

## 2014-04-29 ENCOUNTER — Encounter: Payer: Self-pay | Admitting: Internal Medicine

## 2014-05-04 ENCOUNTER — Ambulatory Visit: Payer: Medicare Other

## 2014-05-11 ENCOUNTER — Ambulatory Visit (INDEPENDENT_AMBULATORY_CARE_PROVIDER_SITE_OTHER): Payer: Medicare Other

## 2014-05-11 DIAGNOSIS — J309 Allergic rhinitis, unspecified: Secondary | ICD-10-CM

## 2014-05-18 ENCOUNTER — Ambulatory Visit (INDEPENDENT_AMBULATORY_CARE_PROVIDER_SITE_OTHER): Payer: Medicare Other

## 2014-05-18 DIAGNOSIS — J309 Allergic rhinitis, unspecified: Secondary | ICD-10-CM

## 2014-05-25 ENCOUNTER — Ambulatory Visit (INDEPENDENT_AMBULATORY_CARE_PROVIDER_SITE_OTHER): Payer: Medicare Other

## 2014-05-25 DIAGNOSIS — J309 Allergic rhinitis, unspecified: Secondary | ICD-10-CM

## 2014-06-01 ENCOUNTER — Ambulatory Visit (INDEPENDENT_AMBULATORY_CARE_PROVIDER_SITE_OTHER): Payer: Medicare Other

## 2014-06-01 DIAGNOSIS — J309 Allergic rhinitis, unspecified: Secondary | ICD-10-CM

## 2014-06-07 ENCOUNTER — Other Ambulatory Visit: Payer: Self-pay | Admitting: Family Medicine

## 2014-06-08 ENCOUNTER — Other Ambulatory Visit: Payer: Self-pay

## 2014-06-08 ENCOUNTER — Ambulatory Visit (INDEPENDENT_AMBULATORY_CARE_PROVIDER_SITE_OTHER): Payer: Medicare Other

## 2014-06-08 DIAGNOSIS — J309 Allergic rhinitis, unspecified: Secondary | ICD-10-CM

## 2014-06-08 NOTE — Telephone Encounter (Signed)
Is this okay?

## 2014-06-15 ENCOUNTER — Ambulatory Visit (INDEPENDENT_AMBULATORY_CARE_PROVIDER_SITE_OTHER): Payer: Medicare Other

## 2014-06-15 DIAGNOSIS — J309 Allergic rhinitis, unspecified: Secondary | ICD-10-CM

## 2014-06-17 ENCOUNTER — Other Ambulatory Visit: Payer: Self-pay | Admitting: Internal Medicine

## 2014-06-22 ENCOUNTER — Telehealth: Payer: Self-pay | Admitting: Internal Medicine

## 2014-06-22 ENCOUNTER — Ambulatory Visit (INDEPENDENT_AMBULATORY_CARE_PROVIDER_SITE_OTHER): Payer: Medicare Other

## 2014-06-22 DIAGNOSIS — J309 Allergic rhinitis, unspecified: Secondary | ICD-10-CM

## 2014-06-22 MED ORDER — METHYLPHENIDATE HCL ER (OSM) 54 MG PO TBCR
54.0000 mg | EXTENDED_RELEASE_TABLET | ORAL | Status: DC
Start: 2014-08-21 — End: 2014-09-08

## 2014-06-22 MED ORDER — METHYLPHENIDATE HCL ER (OSM) 54 MG PO TBCR: 54.0000 mg | EXTENDED_RELEASE_TABLET | ORAL | Status: DC

## 2014-06-22 NOTE — Telephone Encounter (Signed)
Pt needs a refill on concerta . Pt has schedule an appt for next Friday January 22

## 2014-06-29 ENCOUNTER — Ambulatory Visit (INDEPENDENT_AMBULATORY_CARE_PROVIDER_SITE_OTHER): Payer: Medicare Other

## 2014-06-29 DIAGNOSIS — J309 Allergic rhinitis, unspecified: Secondary | ICD-10-CM

## 2014-06-30 ENCOUNTER — Ambulatory Visit (INDEPENDENT_AMBULATORY_CARE_PROVIDER_SITE_OTHER): Payer: Medicare Other | Admitting: Family Medicine

## 2014-06-30 ENCOUNTER — Encounter: Payer: Self-pay | Admitting: Family Medicine

## 2014-06-30 VITALS — BP 116/70 | HR 86 | Wt 176.0 lb

## 2014-06-30 DIAGNOSIS — F900 Attention-deficit hyperactivity disorder, predominantly inattentive type: Secondary | ICD-10-CM

## 2014-06-30 DIAGNOSIS — J301 Allergic rhinitis due to pollen: Secondary | ICD-10-CM

## 2014-06-30 DIAGNOSIS — K219 Gastro-esophageal reflux disease without esophagitis: Secondary | ICD-10-CM

## 2014-06-30 DIAGNOSIS — I252 Old myocardial infarction: Secondary | ICD-10-CM

## 2014-06-30 DIAGNOSIS — F909 Attention-deficit hyperactivity disorder, unspecified type: Secondary | ICD-10-CM

## 2014-06-30 DIAGNOSIS — I10 Essential (primary) hypertension: Secondary | ICD-10-CM

## 2014-06-30 DIAGNOSIS — H8110 Benign paroxysmal vertigo, unspecified ear: Secondary | ICD-10-CM

## 2014-06-30 DIAGNOSIS — F418 Other specified anxiety disorders: Secondary | ICD-10-CM

## 2014-06-30 DIAGNOSIS — I951 Orthostatic hypotension: Secondary | ICD-10-CM

## 2014-06-30 DIAGNOSIS — E785 Hyperlipidemia, unspecified: Secondary | ICD-10-CM

## 2014-06-30 LAB — COMPREHENSIVE METABOLIC PANEL
ALT: 34 U/L (ref 0–53)
AST: 26 U/L (ref 0–37)
Albumin: 4 g/dL (ref 3.5–5.2)
Alkaline Phosphatase: 79 U/L (ref 39–117)
BUN: 9 mg/dL (ref 6–23)
CALCIUM: 9.2 mg/dL (ref 8.4–10.5)
CO2: 26 meq/L (ref 19–32)
Chloride: 107 mEq/L (ref 96–112)
Creat: 0.92 mg/dL (ref 0.50–1.35)
GLUCOSE: 97 mg/dL (ref 70–99)
POTASSIUM: 3.8 meq/L (ref 3.5–5.3)
Sodium: 142 mEq/L (ref 135–145)
Total Bilirubin: 0.3 mg/dL (ref 0.2–1.2)
Total Protein: 6.6 g/dL (ref 6.0–8.3)

## 2014-06-30 LAB — CBC WITH DIFFERENTIAL/PLATELET
Basophils Absolute: 0 10*3/uL (ref 0.0–0.1)
Basophils Relative: 0 % (ref 0–1)
EOS ABS: 0.2 10*3/uL (ref 0.0–0.7)
Eosinophils Relative: 2 % (ref 0–5)
HEMATOCRIT: 34.8 % — AB (ref 39.0–52.0)
Hemoglobin: 11.7 g/dL — ABNORMAL LOW (ref 13.0–17.0)
Lymphocytes Relative: 24 % (ref 12–46)
Lymphs Abs: 1.9 10*3/uL (ref 0.7–4.0)
MCH: 28.4 pg (ref 26.0–34.0)
MCHC: 33.6 g/dL (ref 30.0–36.0)
MCV: 84.5 fL (ref 78.0–100.0)
MONO ABS: 0.6 10*3/uL (ref 0.1–1.0)
MPV: 9.1 fL (ref 8.6–12.4)
Monocytes Relative: 8 % (ref 3–12)
NEUTROS ABS: 5.3 10*3/uL (ref 1.7–7.7)
Neutrophils Relative %: 66 % (ref 43–77)
Platelets: 210 10*3/uL (ref 150–400)
RBC: 4.12 MIL/uL — ABNORMAL LOW (ref 4.22–5.81)
RDW: 17.6 % — AB (ref 11.5–15.5)
WBC: 8 10*3/uL (ref 4.0–10.5)

## 2014-06-30 LAB — LIPID PANEL
CHOLESTEROL: 95 mg/dL (ref 0–200)
HDL: 51 mg/dL (ref >39)
LDL Cholesterol: 32 mg/dL (ref 0–99)
TRIGLYCERIDES: 61 mg/dL (ref <150)
Total CHOL/HDL Ratio: 1.9 Ratio
VLDL: 12 mg/dL (ref 0–40)

## 2014-07-01 ENCOUNTER — Encounter: Payer: Self-pay | Admitting: Family Medicine

## 2014-07-01 NOTE — Progress Notes (Signed)
   Subjective:    Patient ID: [name], male    DOB: Dec 31, 1945, 69 y.o.   MRN: [id number]  HPI He is here for an interval evaluation. He has been under more stress than usual having to deal with his wife having a hip replacement. He continues to take care of his daughter who apparently has multiple medical problems requiring constant help. She apparently is now applying for her fourth time to get disability. He does plan on getting back into counseling with Evette CristalKenneth Frazier. He has been in counseling in the past. He does have difficulty with dizziness. He explains slight dizziness when he moves from one position to another especially with bending over and then standing up or getting from a lying to a standing position. He also has dizziness secondary occur sometimes when he is lying in bed. He does use meclizine for this. He also has underlying reflux and is on Zegerid. He will occasionally have breakthroughs even in spite of being on Zegerid. He continues on his other medications and is having no difficulty. His ADD seems to be under good control although he does mention that he cannot tell any benefit but his wife can. He does have underlying heart disease and has had no recent difficulties with that.   Review of Systems  All other systems reviewed and are negative.      Objective:   Physical Exam alert and in no distress. Tympanic membranes and canals are normal. Throat is clear. Tonsils are normal. Neck is supple without adenopathy or thyromegaly. Cardiac exam shows a regular sinus rhythm without murmurs or gallops. Lungs are clear to auscultation.        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 He will continue to use the meclizine but I tried to stress using it for dizziness occurring other than with posture. Discussed the potential possibility of backing off on the Zegerid to see if he does truly needed on a daily basis. I again discussed the issue with his daughter. He recognizes that he is an enabler it seems to be unwilling and/or unable to change this. Over 45 minutes spent discussing all these issues with him.

## 2014-07-05 ENCOUNTER — Other Ambulatory Visit: Payer: Self-pay | Admitting: Family Medicine

## 2014-07-05 NOTE — Telephone Encounter (Signed)
Is this okay?

## 2014-07-06 ENCOUNTER — Ambulatory Visit (INDEPENDENT_AMBULATORY_CARE_PROVIDER_SITE_OTHER): Payer: Medicare Other

## 2014-07-06 DIAGNOSIS — J309 Allergic rhinitis, unspecified: Secondary | ICD-10-CM

## 2014-07-13 ENCOUNTER — Ambulatory Visit (INDEPENDENT_AMBULATORY_CARE_PROVIDER_SITE_OTHER): Payer: Medicare Other

## 2014-07-13 DIAGNOSIS — J309 Allergic rhinitis, unspecified: Secondary | ICD-10-CM

## 2014-07-18 ENCOUNTER — Encounter: Payer: Self-pay | Admitting: Internal Medicine

## 2014-07-20 ENCOUNTER — Ambulatory Visit (INDEPENDENT_AMBULATORY_CARE_PROVIDER_SITE_OTHER): Payer: Medicare Other

## 2014-07-20 DIAGNOSIS — J309 Allergic rhinitis, unspecified: Secondary | ICD-10-CM

## 2014-07-27 ENCOUNTER — Ambulatory Visit (INDEPENDENT_AMBULATORY_CARE_PROVIDER_SITE_OTHER): Payer: Medicare Other

## 2014-07-27 DIAGNOSIS — J309 Allergic rhinitis, unspecified: Secondary | ICD-10-CM

## 2014-08-03 ENCOUNTER — Ambulatory Visit (INDEPENDENT_AMBULATORY_CARE_PROVIDER_SITE_OTHER): Payer: Medicare Other

## 2014-08-03 DIAGNOSIS — J309 Allergic rhinitis, unspecified: Secondary | ICD-10-CM

## 2014-08-10 ENCOUNTER — Ambulatory Visit (INDEPENDENT_AMBULATORY_CARE_PROVIDER_SITE_OTHER): Payer: Medicare Other

## 2014-08-10 DIAGNOSIS — J309 Allergic rhinitis, unspecified: Secondary | ICD-10-CM

## 2014-08-15 ENCOUNTER — Telehealth: Payer: Self-pay | Admitting: Family Medicine

## 2014-08-15 NOTE — Telephone Encounter (Signed)
He should have a prescription dated March 13

## 2014-08-15 NOTE — Telephone Encounter (Signed)
Pt needs refills on concerta. Please call [phone] when ready.

## 2014-08-16 ENCOUNTER — Ambulatory Visit (INDEPENDENT_AMBULATORY_CARE_PROVIDER_SITE_OTHER): Payer: Medicare Other | Admitting: Family Medicine

## 2014-08-16 ENCOUNTER — Encounter: Payer: Self-pay | Admitting: Family Medicine

## 2014-08-16 VITALS — BP 128/78 | HR 85 | Wt 168.0 lb

## 2014-08-16 DIAGNOSIS — F418 Other specified anxiety disorders: Secondary | ICD-10-CM | POA: Diagnosis not present

## 2014-08-16 DIAGNOSIS — L723 Sebaceous cyst: Secondary | ICD-10-CM | POA: Diagnosis not present

## 2014-08-16 DIAGNOSIS — H02826 Cysts of left eye, unspecified eyelid: Secondary | ICD-10-CM

## 2014-08-16 MED ORDER — ALPRAZOLAM 0.25 MG PO TABS: ORAL_TABLET | ORAL | Status: DC

## 2014-08-16 NOTE — Telephone Encounter (Signed)
Called and left a detailed message on pt VM

## 2014-08-16 NOTE — Progress Notes (Signed)
   Subjective:    Patient ID: [name], male    DOB: 10-07-1945, 69 y.o.   MRN: [id number]  HPI He is here for evaluation of a spot on the bridge of his nose that he noticed 2 days ago. He also complains of feeling like something is under his left eyelid causing him discomfort. This has been going on for several months. He reports having multiple styes on the same eyelid in the over the past 3 months but no issues with this presently. Denies fever, chills, eye drainage, or visual disturbances. He would also like his Alprazolam refilled and states he is using this as needed for anxiety. He continues to see his therapist for his anxiety as well.  He mentions having trouble with sleeping and states he is getting about 6 hours per night. He has stopped exercising and states in the past this has helped him with sleep.    Review of Systems     Objective:   Physical Exam  He is alert and in no distress. Bilateral conjunctiva pink and moist, sclera white. Has a 1mm whitish round lesion to the interior left eyelid. Raised grayish lesion to the bridge of nose.        Assessment & Plan:   Sebaceous cyst  Cyst of eyelid, left  Depression with anxiety  Sebaceous cyst to bridge of nose was easily expressed. Use warm compresses to eyelid and let me know if this gets worse. Discussed that he should continue seeing therapist for anxiety and start exercising again to improve sleep.

## 2014-08-17 ENCOUNTER — Ambulatory Visit (INDEPENDENT_AMBULATORY_CARE_PROVIDER_SITE_OTHER): Payer: Medicare Other

## 2014-08-17 DIAGNOSIS — J309 Allergic rhinitis, unspecified: Secondary | ICD-10-CM

## 2014-08-22 ENCOUNTER — Ambulatory Visit (INDEPENDENT_AMBULATORY_CARE_PROVIDER_SITE_OTHER): Payer: Medicare Other

## 2014-08-22 DIAGNOSIS — J309 Allergic rhinitis, unspecified: Secondary | ICD-10-CM

## 2014-08-24 ENCOUNTER — Ambulatory Visit (INDEPENDENT_AMBULATORY_CARE_PROVIDER_SITE_OTHER): Payer: Medicare Other

## 2014-08-24 DIAGNOSIS — J309 Allergic rhinitis, unspecified: Secondary | ICD-10-CM

## 2014-08-30 ENCOUNTER — Other Ambulatory Visit: Payer: Self-pay | Admitting: Family Medicine

## 2014-08-30 NOTE — Telephone Encounter (Signed)
Is this okay to refill? 

## 2014-08-31 ENCOUNTER — Ambulatory Visit (INDEPENDENT_AMBULATORY_CARE_PROVIDER_SITE_OTHER): Payer: Medicare Other

## 2014-08-31 DIAGNOSIS — J309 Allergic rhinitis, unspecified: Secondary | ICD-10-CM

## 2014-09-07 ENCOUNTER — Ambulatory Visit (INDEPENDENT_AMBULATORY_CARE_PROVIDER_SITE_OTHER): Payer: Medicare Other

## 2014-09-07 ENCOUNTER — Telehealth: Payer: Self-pay | Admitting: Family Medicine

## 2014-09-07 DIAGNOSIS — J309 Allergic rhinitis, unspecified: Secondary | ICD-10-CM

## 2014-09-07 NOTE — Telephone Encounter (Signed)
Pt asked for refills Concerta for 3 months & call when ready

## 2014-09-08 ENCOUNTER — Telehealth: Payer: Self-pay | Admitting: Family Medicine

## 2014-09-08 MED ORDER — METHYLPHENIDATE HCL ER (OSM) 54 MG PO TBCR: 54.0000 mg | EXTENDED_RELEASE_TABLET | ORAL | Status: DC

## 2014-09-08 NOTE — Telephone Encounter (Signed)
Advised pt Rx Concerta X 3 ready for pick up

## 2014-09-14 ENCOUNTER — Ambulatory Visit (INDEPENDENT_AMBULATORY_CARE_PROVIDER_SITE_OTHER): Payer: Medicare Other

## 2014-09-14 DIAGNOSIS — J309 Allergic rhinitis, unspecified: Secondary | ICD-10-CM

## 2014-09-21 ENCOUNTER — Ambulatory Visit (INDEPENDENT_AMBULATORY_CARE_PROVIDER_SITE_OTHER): Payer: Medicare Other

## 2014-09-21 DIAGNOSIS — J309 Allergic rhinitis, unspecified: Secondary | ICD-10-CM | POA: Diagnosis not present

## 2014-09-28 ENCOUNTER — Ambulatory Visit: Payer: Medicare Other

## 2014-10-04 ENCOUNTER — Ambulatory Visit (INDEPENDENT_AMBULATORY_CARE_PROVIDER_SITE_OTHER): Payer: Medicare Other | Admitting: Family Medicine

## 2014-10-04 ENCOUNTER — Encounter: Payer: Self-pay | Admitting: Family Medicine

## 2014-10-04 VITALS — BP 122/82 | HR 66 | Ht 67.0 in | Wt 171.6 lb

## 2014-10-04 DIAGNOSIS — F900 Attention-deficit hyperactivity disorder, predominantly inattentive type: Secondary | ICD-10-CM

## 2014-10-04 DIAGNOSIS — D649 Anemia, unspecified: Secondary | ICD-10-CM | POA: Diagnosis not present

## 2014-10-04 DIAGNOSIS — M519 Unspecified thoracic, thoracolumbar and lumbosacral intervertebral disc disorder: Secondary | ICD-10-CM

## 2014-10-04 DIAGNOSIS — H8110 Benign paroxysmal vertigo, unspecified ear: Secondary | ICD-10-CM

## 2014-10-04 DIAGNOSIS — F909 Attention-deficit hyperactivity disorder, unspecified type: Secondary | ICD-10-CM

## 2014-10-04 DIAGNOSIS — K219 Gastro-esophageal reflux disease without esophagitis: Secondary | ICD-10-CM

## 2014-10-04 DIAGNOSIS — I252 Old myocardial infarction: Secondary | ICD-10-CM | POA: Diagnosis not present

## 2014-10-04 DIAGNOSIS — J4540 Moderate persistent asthma, uncomplicated: Secondary | ICD-10-CM

## 2014-10-04 DIAGNOSIS — J301 Allergic rhinitis due to pollen: Secondary | ICD-10-CM | POA: Diagnosis not present

## 2014-10-04 DIAGNOSIS — J4531 Mild persistent asthma with (acute) exacerbation: Secondary | ICD-10-CM | POA: Insufficient documentation

## 2014-10-04 DIAGNOSIS — I10 Essential (primary) hypertension: Secondary | ICD-10-CM | POA: Diagnosis not present

## 2014-10-04 DIAGNOSIS — E785 Hyperlipidemia, unspecified: Secondary | ICD-10-CM | POA: Diagnosis not present

## 2014-10-04 LAB — CBC WITH DIFFERENTIAL/PLATELET
Basophils Absolute: 0 10*3/uL (ref 0.0–0.1)
Basophils Relative: 0 % (ref 0–1)
EOS ABS: 0.1 10*3/uL (ref 0.0–0.7)
EOS PCT: 2 % (ref 0–5)
HEMATOCRIT: 40.4 % (ref 39.0–52.0)
Hemoglobin: 13.3 g/dL (ref 13.0–17.0)
LYMPHS ABS: 2.4 10*3/uL (ref 0.7–4.0)
Lymphocytes Relative: 34 % (ref 12–46)
MCH: 28.3 pg (ref 26.0–34.0)
MCHC: 32.9 g/dL (ref 30.0–36.0)
MCV: 86 fL (ref 78.0–100.0)
MONO ABS: 0.5 10*3/uL (ref 0.1–1.0)
MPV: 9.3 fL (ref 8.6–12.4)
Monocytes Relative: 7 % (ref 3–12)
NEUTROS ABS: 4 10*3/uL (ref 1.7–7.7)
NEUTROS PCT: 57 % (ref 43–77)
Platelets: 221 10*3/uL (ref 150–400)
RBC: 4.7 MIL/uL (ref 4.22–5.81)
RDW: 16.4 % — ABNORMAL HIGH (ref 11.5–15.5)
WBC: 7 10*3/uL (ref 4.0–10.5)

## 2014-10-04 NOTE — Progress Notes (Signed)
Subjective:    Patient ID: Dwayne PandyMitchell W Bouler, male    DOB: 10-Jan-1946, 69 y.o.   MRN: [id number]  HPI He is here for an interval evaluation. He is retired and keeps himself busy. He has had no major memory issues. He continues on his psychotropic medications which has some fairly stable. He does have a previous history of depression/dysthymia. He has had recent difficulty with back and recently had MRI and subsequent epidural injection. He still is having some difficulty with back pain.He has a previous history of heart disease and gets regular checkups from his cardiologist. He does not smoke. He does occasionally have difficulty with vertigo and has this under fairly good control. His reflux is giving him very little difficulty. He is physically active but only going to the gym once per week. His allergies are under good control. He continues on his asthma medicines and again is doing well on them. He takes Concerta regularly. He continues to get counseling intermittently for general life stresses. Family and social history as well as health maintenance was reviewed. Review blood work does show a slightly low hemoglobin.  Review of Systems  All other systems reviewed and are negative.      Objective:   Physical Exam BP 122/82 mmHg  Pulse 66  Ht 5\' 7"  (1.702 m)  Wt 171 lb 9.6 oz (77.837 kg)  BMI 26.87 kg/m2  SpO2 99%  General Appearance:    Alert, cooperative, no distress, appears stated age  Head:    Normocephalic, without obvious abnormality, atraumatic  Eyes:    PERRL, conjunctiva/corneas clear, EOM's intact, fundi    benign  Ears:    Normal TM's and external ear canals  Nose:   Nares normal, mucosa normal, no drainage or sinus   tenderness  Throat:   Lips, mucosa, and tongue normal; teeth and gums normal  Neck:   Supple, no lymphadenopathy;  thyroid:  no   enlargement/tenderness/nodules; no carotid   bruit or JVD  Back:    Spine nontender, no curvature, ROM normal, no CVA      tenderness  Lungs:     Clear to auscultation bilaterally without wheezes, rales or     ronchi; respirations unlabored  Chest Wall:    No tenderness or deformity   Heart:    Regular rate and rhythm, S1 and S2 normal, no murmur, rub   or gallop  Breast Exam:    No chest wall tenderness, masses or gynecomastia  Abdomen:     Soft, non-tender, nondistended, normoactive bowel sounds,    no masses, no hepatosplenomegaly  Genitalia:    Normal male external genitalia without lesions.  Testicles without masses.  No inguinal hernias.     Extremities:   No clubbing, cyanosis or edema  Pulses:   2+ and symmetric all extremities  Skin:   Skin color, texture, turgor normal, no rashes or lesions  Lymph nodes:   Cervical, supraclavicular, and axillary nodes normal  Neurologic:   CNII-XII intact, normal strength, sensation and gait; reflexes 2+ and symmetric throughout          Psych:   Normal mood, affect, hygiene and grooming.          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

## 2014-10-05 ENCOUNTER — Ambulatory Visit (INDEPENDENT_AMBULATORY_CARE_PROVIDER_SITE_OTHER): Payer: Medicare Other

## 2014-10-05 DIAGNOSIS — J309 Allergic rhinitis, unspecified: Secondary | ICD-10-CM | POA: Diagnosis not present

## 2014-10-12 ENCOUNTER — Ambulatory Visit: Payer: Medicare Other

## 2014-10-19 ENCOUNTER — Ambulatory Visit (INDEPENDENT_AMBULATORY_CARE_PROVIDER_SITE_OTHER): Payer: Medicare Other

## 2014-10-19 ENCOUNTER — Telehealth: Payer: Self-pay

## 2014-10-19 DIAGNOSIS — J309 Allergic rhinitis, unspecified: Secondary | ICD-10-CM | POA: Diagnosis not present

## 2014-10-19 DIAGNOSIS — Z0279 Encounter for issue of other medical certificate: Secondary | ICD-10-CM

## 2014-10-19 NOTE — Telephone Encounter (Signed)
Pt called us for lab results

## 2014-10-26 ENCOUNTER — Ambulatory Visit (INDEPENDENT_AMBULATORY_CARE_PROVIDER_SITE_OTHER): Payer: Medicare Other

## 2014-10-26 ENCOUNTER — Telehealth: Payer: Self-pay | Admitting: Internal Medicine

## 2014-10-26 DIAGNOSIS — J309 Allergic rhinitis, unspecified: Secondary | ICD-10-CM | POA: Diagnosis not present

## 2014-10-26 NOTE — Telephone Encounter (Signed)
[facility] on 10/24/14 @ [phone]

## 2014-11-02 ENCOUNTER — Ambulatory Visit (INDEPENDENT_AMBULATORY_CARE_PROVIDER_SITE_OTHER): Payer: Medicare Other

## 2014-11-02 DIAGNOSIS — J309 Allergic rhinitis, unspecified: Secondary | ICD-10-CM | POA: Diagnosis not present

## 2014-11-09 ENCOUNTER — Ambulatory Visit (INDEPENDENT_AMBULATORY_CARE_PROVIDER_SITE_OTHER): Payer: Medicare Other

## 2014-11-09 DIAGNOSIS — J309 Allergic rhinitis, unspecified: Secondary | ICD-10-CM | POA: Diagnosis not present

## 2014-11-16 ENCOUNTER — Ambulatory Visit (INDEPENDENT_AMBULATORY_CARE_PROVIDER_SITE_OTHER): Payer: Medicare Other

## 2014-11-16 DIAGNOSIS — J309 Allergic rhinitis, unspecified: Secondary | ICD-10-CM | POA: Diagnosis not present

## 2014-11-17 ENCOUNTER — Encounter: Payer: Self-pay | Admitting: Internal Medicine

## 2014-11-23 ENCOUNTER — Ambulatory Visit (INDEPENDENT_AMBULATORY_CARE_PROVIDER_SITE_OTHER): Payer: Medicare Other

## 2014-11-23 DIAGNOSIS — J309 Allergic rhinitis, unspecified: Secondary | ICD-10-CM | POA: Diagnosis not present

## 2014-11-30 ENCOUNTER — Ambulatory Visit (INDEPENDENT_AMBULATORY_CARE_PROVIDER_SITE_OTHER): Payer: Medicare Other

## 2014-11-30 DIAGNOSIS — J309 Allergic rhinitis, unspecified: Secondary | ICD-10-CM | POA: Diagnosis not present

## 2014-12-05 ENCOUNTER — Encounter: Payer: Self-pay | Admitting: Family Medicine

## 2014-12-05 ENCOUNTER — Ambulatory Visit (INDEPENDENT_AMBULATORY_CARE_PROVIDER_SITE_OTHER): Payer: Medicare Other | Admitting: Family Medicine

## 2014-12-05 VITALS — BP 112/78 | HR 81 | Wt 168.0 lb

## 2014-12-05 DIAGNOSIS — J301 Allergic rhinitis due to pollen: Secondary | ICD-10-CM

## 2014-12-05 DIAGNOSIS — H8110 Benign paroxysmal vertigo, unspecified ear: Secondary | ICD-10-CM | POA: Diagnosis not present

## 2014-12-05 NOTE — Progress Notes (Signed)
   Subjective:    Patient ID: [name], male    DOB: June 22, 1945, 69 y.o.   MRN: [id number]  HPI He complains of a several day history of frontal type headache with some nausea, dizziness, fatigue and malaise as well as nasal congestion but no PND, sore throat or earache. He has a previous history of allergic rhinitis and is on several medications as well as getting shots. He has tried an OTC sinus medication and did get some relief from this. He also has been having difficulty with low back pain. He is being followed by orthopedics and apparently is scheduled for an epidural in the near future.   Review of Systems     Objective:   Physical Exam Alert and in no distress. Tympanic membranes and canals are normal. Pharyngeal area is normal. Neck is supple without adenopathy or thyromegaly. Cardiac exam shows a regular sinus rhythm without murmurs or gallops. Lungs are clear to auscultation. Palpable pressure over the frontal sinus did make him slightly more dizzy.        Assessment & Plan:  Allergic rhinitis due to pollen  Benign paroxysmal positional vertigo, unspecified laterality I explained that his symptoms are probably allergy related. Recommend he add ibuprofen to his regimen discussed the use of this with his Plavix. He will also add Flonase to his regimen.

## 2014-12-05 NOTE — Patient Instructions (Signed)
Use Flonase as directed if you have trouble let me know. Short-term use of ibuprofen is okay. Stay on your other medications too.

## 2014-12-07 ENCOUNTER — Telehealth: Payer: Self-pay | Admitting: Internal Medicine

## 2014-12-07 ENCOUNTER — Ambulatory Visit: Payer: Medicare Other

## 2014-12-07 NOTE — Telephone Encounter (Signed)
Pt still has a headache and thinks its sinus allergy and has taken over the counter meds but no relief. [facility] [address]

## 2014-12-07 NOTE — Telephone Encounter (Signed)
He is still having a headache but no symptoms of an infection. I will have him increase his ibuprofen to 600 mg 4 times a day. He will call me the first week is still having difficulty.

## 2014-12-08 ENCOUNTER — Ambulatory Visit: Payer: Medicare Other

## 2014-12-14 ENCOUNTER — Ambulatory Visit (INDEPENDENT_AMBULATORY_CARE_PROVIDER_SITE_OTHER): Payer: Medicare Other

## 2014-12-14 DIAGNOSIS — J309 Allergic rhinitis, unspecified: Secondary | ICD-10-CM | POA: Diagnosis not present

## 2014-12-21 ENCOUNTER — Ambulatory Visit (INDEPENDENT_AMBULATORY_CARE_PROVIDER_SITE_OTHER): Payer: Medicare Other

## 2014-12-21 DIAGNOSIS — J309 Allergic rhinitis, unspecified: Secondary | ICD-10-CM

## 2014-12-28 ENCOUNTER — Ambulatory Visit (INDEPENDENT_AMBULATORY_CARE_PROVIDER_SITE_OTHER): Payer: Medicare Other

## 2014-12-28 DIAGNOSIS — J309 Allergic rhinitis, unspecified: Secondary | ICD-10-CM

## 2015-01-04 ENCOUNTER — Telehealth: Payer: Self-pay | Admitting: Internal Medicine

## 2015-01-04 ENCOUNTER — Ambulatory Visit (INDEPENDENT_AMBULATORY_CARE_PROVIDER_SITE_OTHER): Payer: Medicare Other

## 2015-01-04 DIAGNOSIS — J309 Allergic rhinitis, unspecified: Secondary | ICD-10-CM | POA: Diagnosis not present

## 2015-01-04 NOTE — Telephone Encounter (Signed)
Spoke with pt. Advised him of CY's recommendation. He agreed and verbalized understanding.  Will route message to Dimas Millin to make her aware.

## 2015-01-04 NOTE — Telephone Encounter (Signed)
Opened in error

## 2015-01-04 NOTE — Telephone Encounter (Signed)
Thanks, CDY gave me a rx discontinuing his vac.[name]

## 2015-01-04 NOTE — Telephone Encounter (Signed)
Spoke with pt. States that he wants to know if CY wants him to continue his allergy injections. He has been on them since 2009 and is almost out of serum. Does want to spend more money on serum if it's not needed. I asked him if feels like they aren't working for him but he never gave a straight answer to this question. Has pending OV with CY in September.  CY - please advise if you want him to continue injections.

## 2015-01-04 NOTE — Telephone Encounter (Signed)
Let's stop allergy shots now and watch to see how he does.

## 2015-01-11 ENCOUNTER — Ambulatory Visit: Payer: Medicare Other

## 2015-01-14 ENCOUNTER — Other Ambulatory Visit: Payer: Self-pay | Admitting: Internal Medicine

## 2015-01-17 ENCOUNTER — Telehealth: Payer: Self-pay | Admitting: Family Medicine

## 2015-01-17 MED ORDER — METHYLPHENIDATE HCL ER (OSM) 54 MG PO TBCR: 54.0000 mg | EXTENDED_RELEASE_TABLET | ORAL | Status: DC

## 2015-01-17 NOTE — Telephone Encounter (Signed)
Pt called for refills of concerta. Call when ready.

## 2015-02-02 ENCOUNTER — Encounter: Payer: Self-pay | Admitting: Internal Medicine

## 2015-02-07 ENCOUNTER — Other Ambulatory Visit: Payer: Self-pay | Admitting: Neurosurgery

## 2015-02-17 NOTE — Pre-Procedure Instructions (Signed)
[name]  02/17/2015      [id number] [place], [place] - [address] [place] [address] Phone: [phone] Fax: [phone]  [facility] - [place], [place] - [address] [place] [address] Phone: [phone] Fax: [phone]  [id number] [place], [place] - [address] [place] [address] Phone: [phone] Fax: [phone]  [facility] - [place], [place] - [address] [place] [address] Phone: [phone] Fax: [phone]  [facility] - [place], [place] [address] [place] [address] [place] [address] Phone: [phone] Fax: [phone]    Your procedure is scheduled on Tues, Sept 20 @ 10:10 AM  Report to [facility] Admitting at 8:00 AM  Call this number if you have problems the morning of surgery:  [phone]   Remember:  Do not eat food or drink liquids after midnight.  Take these medicines the morning of surgery with A SIP OF WATER Albuterol<Bring Your Inhaler With You>,Xanax(Alprazolam),Symbicort(Budesonide),Wellbutrin(Bupropion),Zyrtec(Cetirizine),Cardizem(Diltiazem),Mucinex(Guaifenesin),Meclizine(Antivert-if needed),Concerta(Methylphenidate),and Pristiq                Stop taking your Aspirin and using your Diclofenac gel. No Goody's,BC's,Aleve,Ibuprofen,Fish Oil,or any Herbal Medications.    Do not wear jewelry.  Do not wear lotions, powders, or colognes.  You may wear deodorant.             Men may shave face and neck.  Do not bring valuables to the hospital.  [facility] is not responsible for any belongings or valuables.  Contacts, dentures or bridgework may not be worn into surgery.  Leave your suitcase in the car.  After surgery it may be brought to your room.  For patients admitted to the hospital, discharge time  will be determined by your treatment team.  Patients discharged the day of surgery will not be allowed to drive home.    Special instructions:  [facility] - Preparing for Surgery  Before surgery, you can play an important role.  Because skin is not sterile, your skin needs to be as free of germs as possible.  You can reduce the number of germs on you skin by washing with CHG (chlorahexidine gluconate) soap before surgery.  CHG is an antiseptic cleaner which kills germs and bonds with the skin to continue killing germs even after washing.  Please DO NOT use if you have an allergy to CHG or antibacterial soaps.  If your skin becomes reddened/irritated stop using the CHG and inform your nurse when you arrive at Short Stay.  Do not shave (including legs and underarms) for at least 48 hours prior to the first CHG shower.  You may shave your face.  Please follow these instructions carefully:   1.  Shower with CHG Soap the night before surgery and the                                morning of Surgery.  2.  If you choose to wash your hair, wash your hair first as usual with your       normal shampoo.  3.  After you shampoo, rinse your hair and body thoroughly to remove the  Shampoo.  4.  Use CHG as you would any other liquid soap.  You can apply chg directly       to the skin and wash gently with scrungie or a clean washcloth.  5.  Apply the CHG Soap to your body ONLY FROM THE NECK DOWN.        Do not use on open wounds or open sores.  Avoid contact with your eyes,       ears, mouth and genitals (private parts).  Wash genitals (private parts)       with your normal soap.  6.  Wash thoroughly, paying special attention to the area where your surgery        will be performed.  7.  Thoroughly rinse your body with warm water from the neck down.  8.  DO NOT shower/wash with your normal soap after using and rinsing off       the CHG Soap.  9.  Pat yourself dry with a clean towel.             10.  Wear clean pajamas.            11.  Place clean sheets on your bed the night of your first shower and do not        sleep with pets.  Day of Surgery  Do not apply any lotions/deoderants the morning of surgery.  Please wear clean clothes to the hospital/surgery center.    Please read over the following fact sheets that you were given. Pain Booklet, Coughing and Deep Breathing, Blood Transfusion Information, MRSA Information and Surgical Site Infection Prevention

## 2015-02-20 ENCOUNTER — Other Ambulatory Visit: Payer: Self-pay

## 2015-02-20 ENCOUNTER — Encounter (HOSPITAL_COMMUNITY)
Admission: RE | Admit: 2015-02-20 | Discharge: 2015-02-20 | Disposition: A | Discharge status: Home / Self Care | Payer: Medicare Other | Source: Ambulatory Visit | Attending: Neurosurgery | Admitting: Neurosurgery

## 2015-02-20 ENCOUNTER — Encounter (HOSPITAL_COMMUNITY): Payer: Self-pay

## 2015-02-20 DIAGNOSIS — Z7982 Long term (current) use of aspirin: Secondary | ICD-10-CM | POA: Insufficient documentation

## 2015-02-20 DIAGNOSIS — Z0183 Encounter for blood typing: Secondary | ICD-10-CM | POA: Diagnosis not present

## 2015-02-20 DIAGNOSIS — I10 Essential (primary) hypertension: Secondary | ICD-10-CM | POA: Insufficient documentation

## 2015-02-20 DIAGNOSIS — R9431 Abnormal electrocardiogram [ECG] [EKG]: Secondary | ICD-10-CM | POA: Diagnosis not present

## 2015-02-20 DIAGNOSIS — E785 Hyperlipidemia, unspecified: Secondary | ICD-10-CM | POA: Insufficient documentation

## 2015-02-20 DIAGNOSIS — Z79899 Other long term (current) drug therapy: Secondary | ICD-10-CM | POA: Insufficient documentation

## 2015-02-20 DIAGNOSIS — M4310 Spondylolisthesis, site unspecified: Secondary | ICD-10-CM | POA: Diagnosis not present

## 2015-02-20 DIAGNOSIS — Z01812 Encounter for preprocedural laboratory examination: Secondary | ICD-10-CM | POA: Diagnosis not present

## 2015-02-20 DIAGNOSIS — I2510 Atherosclerotic heart disease of native coronary artery without angina pectoris: Secondary | ICD-10-CM | POA: Diagnosis not present

## 2015-02-20 DIAGNOSIS — I252 Old myocardial infarction: Secondary | ICD-10-CM | POA: Diagnosis not present

## 2015-02-20 DIAGNOSIS — Z01818 Encounter for other preprocedural examination: Secondary | ICD-10-CM | POA: Insufficient documentation

## 2015-02-20 DIAGNOSIS — K219 Gastro-esophageal reflux disease without esophagitis: Secondary | ICD-10-CM | POA: Insufficient documentation

## 2015-02-20 DIAGNOSIS — Z955 Presence of coronary angioplasty implant and graft: Secondary | ICD-10-CM | POA: Insufficient documentation

## 2015-02-20 DIAGNOSIS — Z7902 Long term (current) use of antithrombotics/antiplatelets: Secondary | ICD-10-CM | POA: Diagnosis not present

## 2015-02-20 DIAGNOSIS — J45909 Unspecified asthma, uncomplicated: Secondary | ICD-10-CM | POA: Diagnosis not present

## 2015-02-20 DIAGNOSIS — J449 Chronic obstructive pulmonary disease, unspecified: Secondary | ICD-10-CM | POA: Insufficient documentation

## 2015-02-20 HISTORY — DX: Angina pectoris, unspecified: I20.9

## 2015-02-20 HISTORY — DX: Chronic obstructive pulmonary disease, unspecified: J44.9

## 2015-02-20 HISTORY — DX: Pneumonia, unspecified organism: J18.9

## 2015-02-20 HISTORY — DX: Reserved for inherently not codable concepts without codable children: IMO0001

## 2015-02-20 LAB — CBC
HCT: 42.7 % (ref 39.0–52.0)
HEMOGLOBIN: 14.2 g/dL (ref 13.0–17.0)
MCH: 29.9 pg (ref 26.0–34.0)
MCHC: 33.3 g/dL (ref 30.0–36.0)
MCV: 89.9 fL (ref 78.0–100.0)
Platelets: 225 10*3/uL (ref 150–400)
RBC: 4.75 MIL/uL (ref 4.22–5.81)
RDW: 14.6 % (ref 11.5–15.5)
WBC: 6 10*3/uL (ref 4.0–10.5)

## 2015-02-20 LAB — BASIC METABOLIC PANEL
ANION GAP: 7 (ref 5–15)
BUN: 14 mg/dL (ref 6–20)
CALCIUM: 9.7 mg/dL (ref 8.9–10.3)
CO2: 27 mmol/L (ref 22–32)
Chloride: 106 mmol/L (ref 101–111)
Creatinine, Ser: 1.01 mg/dL (ref 0.61–1.24)
GLUCOSE: 101 mg/dL — AB (ref 65–99)
Potassium: 4.1 mmol/L (ref 3.5–5.1)
SODIUM: 140 mmol/L (ref 135–145)

## 2015-02-20 LAB — SURGICAL PCR SCREEN
MRSA, PCR: POSITIVE — AB
STAPHYLOCOCCUS AUREUS: POSITIVE — AB

## 2015-02-20 LAB — TYPE AND SCREEN
ABO/RH(D): A NEG
ANTIBODY SCREEN: NEGATIVE

## 2015-02-20 LAB — ABO/RH: ABO/RH(D): A NEG

## 2015-02-20 NOTE — Progress Notes (Signed)
[facility]

## 2015-02-20 NOTE — Progress Notes (Signed)
[name] called and informed of results of pcr screen being positive, and the need to start using the Mupriocin. Voices understanding.

## 2015-02-20 NOTE — Progress Notes (Signed)
PCP is Dr Sharlot Gowda Cardiologist is Dr Donnie Aho- he will see him 02-23-15 for a stress test Pulmonologist is Dr Maple Hudson Pt states he will check with Dr Donnie Aho, but he thinks he will stop plavix on Wed. Pt had sleep study on 2005, but does not require a cpap or bipap

## 2015-02-22 ENCOUNTER — Encounter (HOSPITAL_COMMUNITY): Payer: Self-pay | Admitting: Emergency Medicine

## 2015-02-22 NOTE — Progress Notes (Addendum)
Anesthesia Chart Review:  Pt is 69 year old male scheduled for L3-4, L4-5 PLIF on 02/28/2015 with Dr. Gerlene Fee.   Cardiologist is Dr. Donnie Aho. PCP is Dr. Susann Givens.   PMH includes: CAD (MI and PTCA 1995; stent to RCA 1999), HTN, hyperlipidemia, anemia, asthma, COPD, ADD, GERD. Never smoker. BMI 27. S/p EGD 02/18/12. S/p R toe cheilectomy 10/16/11.   Medications include: albuterol, ASA, lipitor, symbicort, plavix, diltiazem, methyphenidate.   Preoperative labs reviewed.  K is normal, but sample hemolyzed. Will recheck I-stat 4 DOS.   EKG 02/20/2015: NSR. Inferior infarct Possible.  Pt scheduled for stress test with Dr. Donnie Aho 02/23/15.   Echo 10/14/2012: 1. Concentric LVH with inferior wall hypokinesis and mild depressed EF of 45% 2. Doppler evidence of grade I diastolic dysfunction 3. Trace tricuspid regurgitation  Will revisit chart after stress test results available.   Rica Mast, FNP-BC [facility] Short Stay Surgical Center/Anesthesiology Phone: [phone] 02/22/2015 2:34 PM  Addendum:  Nuclear stress test 02/23/2015: -Abnormal lexiscan myoview scan with evidence of a prior inferior infarction with mild perinfarction ischemia -EF 27% with inferior and lateral hypokinesis -recommend obtaining echo to evaluation EF.   Spoke with Olegario Messier in Dr. York Spaniel office. Pt had echo today 02/27/15). Verbally per Dr. Donnie Aho, pt is ok to proceed as scheduled.  Olegario Messier is supposed to fax something in writing to confirm this as echo has not yet been dictated.   If no changes, I anticipate pt can proceed with surgery as scheduled.   Rica Mast, FNP-BC [facility] Short Stay Surgical Center/Anesthesiology Phone: [phone] 02/27/2015 4:18 PM  Addendum:  02/27/15 Echo received from Dr. York Spaniel office. Results showed: Mild LVH with inferior hypokinesis and EF of 40-45%. Doppler evidence of grade 1 diastolic dysfunction. Trace MR/TR. Mild pulmonary regurgitation.  Dr. Donnie Aho dictated a note this afternoon  stating, "MAY PROCEED WITH SURGERY from a cardiac viewpoint on the above named patient. If you have nay other concerns please contact us at that number above [phone]]."   Velna Ochs [facility] Short Stay Center/Anesthesiology Phone [phone] 02/27/2015 4:58 PM

## 2015-02-27 MED ORDER — CEFAZOLIN SODIUM-DEXTROSE 2-3 GM-% IV SOLR
2.0000 g | INTRAVENOUS | Status: AC
  Administered 2015-02-28 (×2): 2 g via INTRAVENOUS
  Filled 2015-02-27: qty 50

## 2015-02-27 NOTE — Progress Notes (Signed)
Pt notified of time change;to arrive at 0700.Verbalized understanding. 

## 2015-02-28 ENCOUNTER — Inpatient Hospital Stay (HOSPITAL_COMMUNITY): Payer: Medicare Other

## 2015-02-28 ENCOUNTER — Inpatient Hospital Stay (HOSPITAL_COMMUNITY): Payer: Medicare Other | Admitting: Certified Registered Nurse Anesthetist

## 2015-02-28 ENCOUNTER — Encounter (HOSPITAL_COMMUNITY)
Admission: RE | Disposition: A | Discharge status: Home / Self Care | Payer: Self-pay | Source: Ambulatory Visit | Attending: Neurosurgery

## 2015-02-28 ENCOUNTER — Encounter (HOSPITAL_COMMUNITY): Payer: Self-pay | Admitting: *Deleted

## 2015-02-28 ENCOUNTER — Inpatient Hospital Stay (HOSPITAL_COMMUNITY)
Admission: RE | Admit: 2015-02-28 | Discharge: 2015-03-03 | DRG: 460 | Disposition: A | Discharge status: Home / Self Care | Payer: Medicare Other | Source: Ambulatory Visit | Attending: Neurosurgery | Admitting: Neurosurgery

## 2015-02-28 DIAGNOSIS — M419 Scoliosis, unspecified: Secondary | ICD-10-CM | POA: Diagnosis present

## 2015-02-28 DIAGNOSIS — J45909 Unspecified asthma, uncomplicated: Secondary | ICD-10-CM | POA: Diagnosis present

## 2015-02-28 DIAGNOSIS — Z8249 Family history of ischemic heart disease and other diseases of the circulatory system: Secondary | ICD-10-CM | POA: Diagnosis not present

## 2015-02-28 DIAGNOSIS — I252 Old myocardial infarction: Secondary | ICD-10-CM | POA: Diagnosis not present

## 2015-02-28 DIAGNOSIS — Z833 Family history of diabetes mellitus: Secondary | ICD-10-CM

## 2015-02-28 DIAGNOSIS — Z419 Encounter for procedure for purposes other than remedying health state, unspecified: Secondary | ICD-10-CM

## 2015-02-28 DIAGNOSIS — I10 Essential (primary) hypertension: Secondary | ICD-10-CM | POA: Diagnosis present

## 2015-02-28 DIAGNOSIS — K219 Gastro-esophageal reflux disease without esophagitis: Secondary | ICD-10-CM | POA: Diagnosis present

## 2015-02-28 DIAGNOSIS — G4700 Insomnia, unspecified: Secondary | ICD-10-CM | POA: Diagnosis present

## 2015-02-28 DIAGNOSIS — E785 Hyperlipidemia, unspecified: Secondary | ICD-10-CM | POA: Diagnosis present

## 2015-02-28 DIAGNOSIS — F909 Attention-deficit hyperactivity disorder, unspecified type: Secondary | ICD-10-CM | POA: Diagnosis present

## 2015-02-28 DIAGNOSIS — J449 Chronic obstructive pulmonary disease, unspecified: Secondary | ICD-10-CM | POA: Diagnosis present

## 2015-02-28 DIAGNOSIS — M4316 Spondylolisthesis, lumbar region: Principal | ICD-10-CM | POA: Diagnosis present

## 2015-02-28 DIAGNOSIS — M4806 Spinal stenosis, lumbar region: Secondary | ICD-10-CM | POA: Diagnosis present

## 2015-02-28 DIAGNOSIS — Z8261 Family history of arthritis: Secondary | ICD-10-CM

## 2015-02-28 DIAGNOSIS — M79605 Pain in left leg: Secondary | ICD-10-CM | POA: Diagnosis present

## 2015-02-28 DIAGNOSIS — F418 Other specified anxiety disorders: Secondary | ICD-10-CM | POA: Diagnosis present

## 2015-02-28 DIAGNOSIS — I2510 Atherosclerotic heart disease of native coronary artery without angina pectoris: Secondary | ICD-10-CM | POA: Diagnosis present

## 2015-02-28 HISTORY — DX: Spondylolisthesis, lumbar region: M43.16

## 2015-02-28 LAB — POCT I-STAT 4, (NA,K, GLUC, HGB,HCT)
GLUCOSE: 102 mg/dL — AB (ref 65–99)
HEMATOCRIT: 41 % (ref 39.0–52.0)
HEMOGLOBIN: 13.9 g/dL (ref 13.0–17.0)
POTASSIUM: 3.8 mmol/L (ref 3.5–5.1)
SODIUM: 142 mmol/L (ref 135–145)

## 2015-02-28 SURGERY — POSTERIOR LUMBAR FUSION 2 LEVEL
Anesthesia: General | Site: Back

## 2015-02-28 SURGERY — POSTERIOR LUMBAR FUSION 2 LEVEL
Anesthesia: General | Site: Spine Lumbar

## 2015-02-28 MED ORDER — LACTATED RINGERS IV SOLN
INTRAVENOUS | Status: DC
  Administered 2015-02-28 (×3): via INTRAVENOUS

## 2015-02-28 MED ORDER — ROCURONIUM BROMIDE 100 MG/10ML IV SOLN
INTRAVENOUS | Status: DC | PRN
  Administered 2015-02-28: 40 mg via INTRAVENOUS

## 2015-02-28 MED ORDER — LIDOCAINE HCL (CARDIAC) 20 MG/ML IV SOLN
INTRAVENOUS | Status: AC
  Filled 2015-02-28: qty 5

## 2015-02-28 MED ORDER — SUCCINYLCHOLINE CHLORIDE 20 MG/ML IJ SOLN
INTRAMUSCULAR | Status: AC
  Filled 2015-02-28: qty 1

## 2015-02-28 MED ORDER — FENTANYL CITRATE (PF) 100 MCG/2ML IJ SOLN
INTRAMUSCULAR | Status: DC | PRN
  Administered 2015-02-28: 50 ug via INTRAVENOUS
  Administered 2015-02-28: 100 ug via INTRAVENOUS
  Administered 2015-02-28 (×2): 50 ug via INTRAVENOUS

## 2015-02-28 MED ORDER — HYDROMORPHONE HCL 1 MG/ML IJ SOLN
0.2500 mg | INTRAMUSCULAR | Status: DC | PRN
  Administered 2015-02-28 (×2): 0.5 mg via INTRAVENOUS

## 2015-02-28 MED ORDER — VECURONIUM BROMIDE 10 MG IV SOLR
INTRAVENOUS | Status: DC | PRN
  Administered 2015-02-28: 2 mg via INTRAVENOUS
  Administered 2015-02-28: 4 mg via INTRAVENOUS
  Administered 2015-02-28: 1 mg via INTRAVENOUS

## 2015-02-28 MED ORDER — MIDAZOLAM HCL 5 MG/5ML IJ SOLN
INTRAMUSCULAR | Status: DC | PRN
  Administered 2015-02-28: 2 mg via INTRAVENOUS

## 2015-02-28 MED ORDER — ACETAMINOPHEN 650 MG RE SUPP
650.0000 mg | RECTAL | Status: DC | PRN
Start: 2015-02-28 — End: 2015-03-03

## 2015-02-28 MED ORDER — DOCUSATE SODIUM 100 MG PO CAPS
100.0000 mg | ORAL_CAPSULE | Freq: Two times a day (BID) | ORAL | Status: DC
  Administered 2015-02-28 – 2015-03-03 (×6): 100 mg via ORAL
  Filled 2015-02-28 (×6): qty 1

## 2015-02-28 MED ORDER — THROMBIN 20000 UNITS EX KIT
PACK | CUTANEOUS | Status: AC
  Filled 2015-02-28: qty 1

## 2015-02-28 MED ORDER — FENTANYL CITRATE (PF) 250 MCG/5ML IJ SOLN
INTRAMUSCULAR | Status: AC
  Filled 2015-02-28: qty 5

## 2015-02-28 MED ORDER — THROMBIN 20000 UNITS EX SOLR
CUTANEOUS | Status: DC | PRN
  Administered 2015-02-28: 20 mL via TOPICAL
  Administered 2015-02-28: 12:00:00 via TOPICAL
  Administered 2015-02-28: 20 mL via TOPICAL

## 2015-02-28 MED ORDER — MENTHOL 3 MG MT LOZG: 1.0000 | LOZENGE | OROMUCOSAL | Status: DC | PRN

## 2015-02-28 MED ORDER — DILTIAZEM HCL ER COATED BEADS 240 MG PO CP24
240.0000 mg | ORAL_CAPSULE | Freq: Every day | ORAL | Status: DC
  Administered 2015-03-01 – 2015-03-03 (×3): 240 mg via ORAL
  Filled 2015-02-28 (×3): qty 1

## 2015-02-28 MED ORDER — ROCURONIUM BROMIDE 50 MG/5ML IV SOLN
INTRAVENOUS | Status: AC
  Filled 2015-02-28: qty 1

## 2015-02-28 MED ORDER — HYDROMORPHONE HCL 1 MG/ML IJ SOLN
INTRAMUSCULAR | Status: AC
  Filled 2015-02-28: qty 1

## 2015-02-28 MED ORDER — MIDAZOLAM HCL 2 MG/2ML IJ SOLN
INTRAMUSCULAR | Status: AC
  Filled 2015-02-28: qty 4

## 2015-02-28 MED ORDER — THROMBIN 20000 UNITS EX SOLR
CUTANEOUS | Status: AC
  Filled 2015-02-28: qty 20000

## 2015-02-28 MED ORDER — SODIUM CHLORIDE 0.9 % IV SOLN: 250.0000 mL | INTRAVENOUS | Status: DC

## 2015-02-28 MED ORDER — PROPOFOL 10 MG/ML IV BOLUS
INTRAVENOUS | Status: AC
  Filled 2015-02-28: qty 20

## 2015-02-28 MED ORDER — ONDANSETRON HCL 4 MG/2ML IJ SOLN
INTRAMUSCULAR | Status: AC
  Filled 2015-02-28: qty 2

## 2015-02-28 MED ORDER — BUDESONIDE-FORMOTEROL FUMARATE 160-4.5 MCG/ACT IN AERO: 2.0000 | INHALATION_SPRAY | Freq: Two times a day (BID) | RESPIRATORY_TRACT | Status: DC

## 2015-02-28 MED ORDER — NEOSTIGMINE METHYLSULFATE 10 MG/10ML IV SOLN
INTRAVENOUS | Status: AC
  Filled 2015-02-28: qty 1

## 2015-02-28 MED ORDER — PROMETHAZINE HCL 25 MG/ML IJ SOLN
INTRAMUSCULAR | Status: AC
  Filled 2015-02-28: qty 1

## 2015-02-28 MED ORDER — PHENYLEPHRINE HCL 10 MG/ML IJ SOLN
10.0000 mg | INTRAVENOUS | Status: DC | PRN
  Administered 2015-02-28: 25 ug/min via INTRAVENOUS

## 2015-02-28 MED ORDER — SODIUM CHLORIDE 0.9 % IJ SOLN
3.0000 mL | Freq: Two times a day (BID) | INTRAMUSCULAR | Status: DC
  Administered 2015-03-01 – 2015-03-02 (×3): 3 mL via INTRAVENOUS

## 2015-02-28 MED ORDER — PROMETHAZINE HCL 25 MG/ML IJ SOLN
6.2500 mg | INTRAMUSCULAR | Status: AC | PRN
  Administered 2015-02-28 (×2): 6.25 mg via INTRAVENOUS

## 2015-02-28 MED ORDER — ALBUMIN HUMAN 5 % IV SOLN
INTRAVENOUS | Status: DC | PRN
  Administered 2015-02-28: 13:00:00 via INTRAVENOUS

## 2015-02-28 MED ORDER — KCL IN DEXTROSE-NACL 20-5-0.45 MEQ/L-%-% IV SOLN
80.0000 mL/h | INTRAVENOUS | Status: DC
  Administered 2015-03-01: 80 mL/h via INTRAVENOUS
  Filled 2015-02-28 (×7): qty 1000

## 2015-02-28 MED ORDER — CYCLOBENZAPRINE HCL 10 MG PO TABS
10.0000 mg | ORAL_TABLET | Freq: Three times a day (TID) | ORAL | Status: DC | PRN
  Administered 2015-02-28: 10 mg via ORAL
  Filled 2015-02-28: qty 1

## 2015-02-28 MED ORDER — GLYCOPYRROLATE 0.2 MG/ML IJ SOLN
INTRAMUSCULAR | Status: AC
  Filled 2015-02-28: qty 1

## 2015-02-28 MED ORDER — ARTIFICIAL TEARS OP OINT
TOPICAL_OINTMENT | OPHTHALMIC | Status: AC
  Filled 2015-02-28: qty 3.5

## 2015-02-28 MED ORDER — EPHEDRINE SULFATE 50 MG/ML IJ SOLN
INTRAMUSCULAR | Status: AC
  Filled 2015-02-28: qty 1

## 2015-02-28 MED ORDER — ATORVASTATIN CALCIUM 80 MG PO TABS
80.0000 mg | ORAL_TABLET | Freq: Every day | ORAL | Status: DC
  Administered 2015-03-01 – 2015-03-02 (×3): 80 mg via ORAL
  Filled 2015-02-28 (×3): qty 1

## 2015-02-28 MED ORDER — PANTOPRAZOLE SODIUM 40 MG IV SOLR
40.0000 mg | Freq: Every day | INTRAVENOUS | Status: DC
  Administered 2015-02-28: 40 mg via INTRAVENOUS
  Filled 2015-02-28: qty 40

## 2015-02-28 MED ORDER — HYDROCODONE-ACETAMINOPHEN 5-325 MG PO TABS
1.0000 | ORAL_TABLET | ORAL | Status: DC | PRN
  Administered 2015-02-28 – 2015-03-03 (×8): 2 via ORAL
  Filled 2015-02-28 (×8): qty 2

## 2015-02-28 MED ORDER — ALPRAZOLAM 0.25 MG PO TABS: 0.2500 mg | ORAL_TABLET | Freq: Three times a day (TID) | ORAL | Status: DC | PRN

## 2015-02-28 MED ORDER — PROPOFOL 10 MG/ML IV BOLUS
INTRAVENOUS | Status: DC | PRN
  Administered 2015-02-28: 130 mg via INTRAVENOUS

## 2015-02-28 MED ORDER — ONDANSETRON HCL 4 MG/2ML IJ SOLN
4.0000 mg | INTRAMUSCULAR | Status: DC | PRN
  Administered 2015-02-28: 4 mg via INTRAVENOUS
  Filled 2015-02-28: qty 2

## 2015-02-28 MED ORDER — STERILE WATER FOR INJECTION IJ SOLN
INTRAMUSCULAR | Status: AC
  Filled 2015-02-28: qty 10

## 2015-02-28 MED ORDER — LIDOCAINE HCL (CARDIAC) 20 MG/ML IV SOLN
INTRAVENOUS | Status: DC | PRN
  Administered 2015-02-28: 60 mg via INTRAVENOUS

## 2015-02-28 MED ORDER — MEPERIDINE HCL 25 MG/ML IJ SOLN: 6.2500 mg | INTRAMUSCULAR | Status: DC | PRN

## 2015-02-28 MED ORDER — VANCOMYCIN HCL 1000 MG IV SOLR
INTRAVENOUS | Status: DC | PRN
  Administered 2015-02-28: 1000 mg via TOPICAL

## 2015-02-28 MED ORDER — GLYCOPYRROLATE 0.2 MG/ML IJ SOLN
INTRAMUSCULAR | Status: AC
  Filled 2015-02-28: qty 4

## 2015-02-28 MED ORDER — BUPIVACAINE HCL (PF) 0.5 % IJ SOLN
INTRAMUSCULAR | Status: DC | PRN
  Administered 2015-02-28: 20 mL

## 2015-02-28 MED ORDER — VENLAFAXINE HCL ER 37.5 MG PO CP24
37.5000 mg | ORAL_CAPSULE | Freq: Every day | ORAL | Status: DC
  Administered 2015-03-01 – 2015-03-03 (×3): 37.5 mg via ORAL
  Filled 2015-02-28 (×3): qty 1

## 2015-02-28 MED ORDER — ONDANSETRON HCL 4 MG/2ML IJ SOLN
4.0000 mg | Freq: Once | INTRAMUSCULAR | Status: AC
  Administered 2015-02-28: 4 mg via INTRAVENOUS

## 2015-02-28 MED ORDER — ZOLPIDEM TARTRATE 5 MG PO TABS: 5.0000 mg | ORAL_TABLET | Freq: Every evening | ORAL | Status: DC | PRN

## 2015-02-28 MED ORDER — PHENOL 1.4 % MT LIQD: 1.0000 | OROMUCOSAL | Status: DC | PRN

## 2015-02-28 MED ORDER — CEFAZOLIN SODIUM-DEXTROSE 2-3 GM-% IV SOLR
2.0000 g | Freq: Three times a day (TID) | INTRAVENOUS | Status: AC
  Administered 2015-02-28 – 2015-03-01 (×2): 2 g via INTRAVENOUS
  Filled 2015-02-28 (×2): qty 50

## 2015-02-28 MED ORDER — PHENYLEPHRINE 40 MCG/ML (10ML) SYRINGE FOR IV PUSH (FOR BLOOD PRESSURE SUPPORT)
PREFILLED_SYRINGE | INTRAVENOUS | Status: AC
  Filled 2015-02-28: qty 10

## 2015-02-28 MED ORDER — HYDROMORPHONE HCL 1 MG/ML IJ SOLN
1.0000 mg | INTRAMUSCULAR | Status: DC | PRN
  Administered 2015-02-28 – 2015-03-02 (×2): 1 mg via INTRAMUSCULAR
  Filled 2015-02-28: qty 2
  Filled 2015-02-28: qty 1

## 2015-02-28 MED ORDER — DEXAMETHASONE SODIUM PHOSPHATE 10 MG/ML IJ SOLN
10.0000 mg | INTRAMUSCULAR | Status: AC
  Administered 2015-02-28: 10 mg via INTRAVENOUS
  Filled 2015-02-28: qty 1

## 2015-02-28 MED ORDER — 0.9 % SODIUM CHLORIDE (POUR BTL) OPTIME
TOPICAL | Status: DC | PRN
  Administered 2015-02-28: 1000 mL

## 2015-02-28 MED ORDER — BUPROPION HCL ER (XL) 150 MG PO TB24
300.0000 mg | ORAL_TABLET | Freq: Every day | ORAL | Status: DC
  Administered 2015-03-01 – 2015-03-03 (×3): 300 mg via ORAL
  Filled 2015-02-28 (×3): qty 2

## 2015-02-28 MED ORDER — ACETAMINOPHEN 325 MG PO TABS: 650.0000 mg | ORAL_TABLET | ORAL | Status: DC | PRN

## 2015-02-28 MED ORDER — VANCOMYCIN HCL 1000 MG IV SOLR
INTRAVENOUS | Status: AC
  Filled 2015-02-28: qty 1000

## 2015-02-28 MED ORDER — GLYCOPYRROLATE 0.2 MG/ML IJ SOLN
INTRAMUSCULAR | Status: AC
  Filled 2015-02-28: qty 3

## 2015-02-28 MED ORDER — MONTELUKAST SODIUM 10 MG PO TABS
5.0000 mg | ORAL_TABLET | Freq: Every day | ORAL | Status: DC
  Administered 2015-03-01 – 2015-03-02 (×2): 5 mg via ORAL
  Filled 2015-02-28 (×3): qty 1

## 2015-02-28 MED ORDER — DEXAMETHASONE SODIUM PHOSPHATE 10 MG/ML IJ SOLN
INTRAMUSCULAR | Status: AC
  Filled 2015-02-28: qty 1

## 2015-02-28 MED ORDER — BISACODYL 5 MG PO TBEC: 5.0000 mg | DELAYED_RELEASE_TABLET | Freq: Every day | ORAL | Status: DC | PRN

## 2015-02-28 MED ORDER — SODIUM CHLORIDE 0.9 % IR SOLN
Status: DC | PRN
  Administered 2015-02-28 (×2)

## 2015-02-28 MED ORDER — PHENYLEPHRINE HCL 10 MG/ML IJ SOLN
INTRAMUSCULAR | Status: DC | PRN
  Administered 2015-02-28: 80 ug via INTRAVENOUS

## 2015-02-28 MED ORDER — EPHEDRINE SULFATE 50 MG/ML IJ SOLN
INTRAMUSCULAR | Status: DC | PRN
  Administered 2015-02-28 (×2): 10 mg via INTRAVENOUS

## 2015-02-28 MED ORDER — NITROGLYCERIN 0.4 MG SL SUBL: 0.4000 mg | SUBLINGUAL_TABLET | SUBLINGUAL | Status: DC | PRN

## 2015-02-28 MED ORDER — ONDANSETRON HCL 4 MG/2ML IJ SOLN
INTRAMUSCULAR | Status: DC | PRN
  Administered 2015-02-28: 4 mg via INTRAVENOUS

## 2015-02-28 MED ORDER — SODIUM CHLORIDE 0.9 % IJ SOLN
3.0000 mL | INTRAMUSCULAR | Status: DC | PRN
Start: 2015-02-28 — End: 2015-03-03

## 2015-02-28 SURGICAL SUPPLY — 68 items
APL SKNCLS STERI-STRIP NONHPOA (GAUZE/BANDAGES/DRESSINGS) ×2
BAG DECANTER FOR FLEXI CONT (MISCELLANEOUS) ×2 IMPLANT
BENZOIN TINCTURE PRP APPL 2/3 (GAUZE/BANDAGES/DRESSINGS) ×4 IMPLANT
BLADE CLIPPER SURG (BLADE) ×2 IMPLANT
BONE EQUIVA 10CC (Bone Implant) ×1 IMPLANT
BRUSH SCRUB EZ PLAIN DRY (MISCELLANEOUS) ×2 IMPLANT
BUR CUTTER 7.0 ROUND (BURR) ×3 IMPLANT
BUR MATCHSTICK NEURO 3.0 LAGG (BURR) ×2 IMPLANT
CANISTER SUCT 3000ML PPV (MISCELLANEOUS) ×2 IMPLANT
CONT SPEC 4OZ CLIKSEAL STRL BL (MISCELLANEOUS) ×4 IMPLANT
COVER BACK TABLE 60X90IN (DRAPES) ×2 IMPLANT
DRAPE C-ARM 42X72 X-RAY (DRAPES) ×2 IMPLANT
DRAPE C-ARMOR (DRAPES) ×2 IMPLANT
DRAPE LAPAROTOMY 100X72X124 (DRAPES) ×2 IMPLANT
DRAPE SURG 17X23 STRL (DRAPES) ×4 IMPLANT
DRSG OPSITE 4X5.5 SM (GAUZE/BANDAGES/DRESSINGS) ×1 IMPLANT
DRSG OPSITE POSTOP 4X6 (GAUZE/BANDAGES/DRESSINGS) ×2 IMPLANT
DRSG OPSITE POSTOP 4X8 (GAUZE/BANDAGES/DRESSINGS) ×1 IMPLANT
DRSG TELFA 3X8 NADH (GAUZE/BANDAGES/DRESSINGS) ×2 IMPLANT
DURAPREP 26ML APPLICATOR (WOUND CARE) ×2 IMPLANT
ELECT REM PT RETURN 9FT ADLT (ELECTROSURGICAL) ×2
ELECTRODE REM PT RTRN 9FT ADLT (ELECTROSURGICAL) ×1 IMPLANT
EVACUATOR 1/8 PVC DRAIN (DRAIN) ×2 IMPLANT
GAUZE SPONGE 4X4 12PLY STRL (GAUZE/BANDAGES/DRESSINGS) ×2 IMPLANT
GAUZE SPONGE 4X4 16PLY XRAY LF (GAUZE/BANDAGES/DRESSINGS) ×1 IMPLANT
GLOVE BIO SURGEON STRL SZ7 (GLOVE) ×2 IMPLANT
GLOVE BIO SURGEON STRL SZ7.5 (GLOVE) ×1 IMPLANT
GLOVE BIOGEL PI IND STRL 7.0 (GLOVE) IMPLANT
GLOVE BIOGEL PI IND STRL 7.5 (GLOVE) IMPLANT
GLOVE BIOGEL PI INDICATOR 7.0 (GLOVE) ×3
GLOVE BIOGEL PI INDICATOR 7.5 (GLOVE) ×2
GLOVE ECLIPSE 8.0 STRL XLNG CF (GLOVE) ×5 IMPLANT
GOWN STRL REUS W/ TWL LRG LVL3 (GOWN DISPOSABLE) IMPLANT
GOWN STRL REUS W/ TWL XL LVL3 (GOWN DISPOSABLE) ×2 IMPLANT
GOWN STRL REUS W/TWL 2XL LVL3 (GOWN DISPOSABLE) IMPLANT
GOWN STRL REUS W/TWL LRG LVL3 (GOWN DISPOSABLE) ×6
GOWN STRL REUS W/TWL XL LVL3 (GOWN DISPOSABLE) ×4
HANDLE PEDIGUARD CANNULATED (INSTRUMENTS) ×1 IMPLANT
IMPLANT PEEK ARDIS 8 X 8 X 26 (Orthopedic Implant) ×2 IMPLANT
K-WIRE NITHNOL TROCAR TIP (WIRE) ×6 IMPLANT
KIT BASIN OR (CUSTOM PROCEDURE TRAY) ×2 IMPLANT
KIT ROOM TURNOVER OR (KITS) ×3 IMPLANT
LIQUID BAND (GAUZE/BANDAGES/DRESSINGS) IMPLANT
NEEDLE 1 PEDIGUARD CANNULATED (NEEDLE) ×2 IMPLANT
NEEDLE HYPO 22GX1.5 SAFETY (NEEDLE) ×2 IMPLANT
NS IRRIG 1000ML POUR BTL (IV SOLUTION) ×2 IMPLANT
PACK LAMINECTOMY NEURO (CUSTOM PROCEDURE TRAY) ×2 IMPLANT
PAD ARMBOARD 7.5X6 YLW CONV (MISCELLANEOUS) ×6 IMPLANT
PAD DRESSING TELFA 3X8 NADH (GAUZE/BANDAGES/DRESSINGS) ×1 IMPLANT
PATTIES SURGICAL .75X.75 (GAUZE/BANDAGES/DRESSINGS) ×1 IMPLANT
PEEK OPTIMA 9X9X26MM (Peek) ×2 IMPLANT
ROD PRE BENT PERC 60MM (Rod) ×2 IMPLANT
SCREW POLYAXIA MIS 6.5X40MM (Screw) ×6 IMPLANT
SHEATH PAT (SHEATH) ×1 IMPLANT
SPONGE LAP 4X18 X RAY DECT (DISPOSABLE) IMPLANT
SPONGE SURGIFOAM ABS GEL 100 (HEMOSTASIS) ×3 IMPLANT
STRIP CLOSURE SKIN 1/2X4 (GAUZE/BANDAGES/DRESSINGS) ×4 IMPLANT
SUT PROLENE 0 CT 1 30 (SUTURE) ×1 IMPLANT
SUT VIC AB 0 CT1 18XCR BRD8 (SUTURE) ×1 IMPLANT
SUT VIC AB 0 CT1 8-18 (SUTURE) ×4
SUT VIC AB 2-0 OS6 18 (SUTURE) ×6 IMPLANT
SUT VIC AB 3-0 CP2 18 (SUTURE) ×3 IMPLANT
SYR 20ML ECCENTRIC (SYRINGE) ×2 IMPLANT
TOP CLSR SEQUOIA (Orthopedic Implant) ×6 IMPLANT
TOWEL OR 17X24 6PK STRL BLUE (TOWEL DISPOSABLE) ×2 IMPLANT
TOWEL OR 17X26 10 PK STRL BLUE (TOWEL DISPOSABLE) ×2 IMPLANT
TRAP SPECIMEN MUCOUS 40CC (MISCELLANEOUS) IMPLANT
WATER STERILE IRR 1000ML POUR (IV SOLUTION) ×2 IMPLANT

## 2015-02-28 NOTE — Transfer of Care (Signed)
Immediate Anesthesia Transfer of Care Note  Patient: [name]  Procedure(s) Performed: Procedure(s): LUMBAR THREE-FOUR, LUMBAR FOUR-FIVE POSTERIOR LUMBAR FUSION  (N/A)  Patient Location: PACU  Anesthesia Type:General  Level of Consciousness: sedated  Airway & Oxygen Therapy: Patient Spontanous Breathing and Patient connected to nasal cannula oxygen  Post-op Assessment: Report given to RN, Post -op Vital signs reviewed and stable and Patient moving all extremities  Post vital signs: Reviewed and stable  Last Vitals:  Filed Vitals:   02/28/15 0755  BP: 109/72  Pulse: 76  Temp: 36.2 C  Resp: 18    Complications: No apparent anesthesia complications

## 2015-02-28 NOTE — Anesthesia Procedure Notes (Signed)
Procedure Name: Intubation Date/Time: 02/28/2015 11:08 AM Performed by: Margaree Mackintosh Pre-anesthesia Checklist: Patient identified, Emergency Drugs available, Suction available, Patient being monitored and Timeout performed Patient Re-evaluated:Patient Re-evaluated prior to inductionOxygen Delivery Method: Circle system utilized Preoxygenation: Pre-oxygenation with 100% oxygen Intubation Type: IV induction Ventilation: Mask ventilation without difficulty Laryngoscope Size: Mac and 3 Grade View: Grade II Tube type: Oral Tube size: 7.5 mm Number of attempts: 1 Airway Equipment and Method: Stylet Placement Confirmation: ETT inserted through vocal cords under direct vision,  positive ETCO2 and breath sounds checked- equal and bilateral Secured at: 23 cm Tube secured with: Tape Dental Injury: Teeth and Oropharynx as per pre-operative assessment

## 2015-02-28 NOTE — Op Note (Signed)
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 Localizing x-ray was used prior to incision to identify the appropriate level. Midline incision was made above the spinous processes of L3-L4 and L5. This was carried down to the dorsal lumbar fascia. The plane between the subcutaneous tissue and the fascia was dissected free for pedicle screw placement the end of the case. We then did a subperiosteal dissection was spinous processes and lamina facet joint at L3-L4 and L5. Self retaining tract was placed for exposure and excision approach the appropriate levels. Spinous processes of L3-L4 and L5 were removed. Generous decompression was then carried out at L3-4 bilaterally. Thorough decompression was carried out until the L3 and L4 nerve roots well visualized well decompressed more so than needed for interbody fusion. Central stenosis was also relieved significantly. We then performed a similar decompression bilaterally at L4-5 decompress the L4 and L5 nerve roots more so than needed for interbody fusion. Thorough decompression was carried out the was hard to mobilize the left side to the patient's previous surgery. We then entered the disc space bilaterally both levels and thoroughly cleaned out. The disc space at L4-5 was extremely collapsed very little disc material was obtained. We did then  prepared the disc for interbody fusion at both levels. We distracted the L3-4 level up to a 9 mm size L4-5 up to an 8 mm size. We prepared cages both levels. Mixture of autologous bone and morselized allograft. We packed the cages bilaterally 89 mm cages and L3 40 mm cages and L4-5. We placed a tox bone morselized allograft deep within the interspace to help with interbody fusion prior to Lasix and the second cage. Then decorticated the residual facet joint placed a mixture of autologous bone morselized allograft for posterolateral fusion. We then irrigated this area copiously controlled any bleeders proper coagulation Gelfoam. We closed the dorsal lumbar fascia with interrupted 0 Vicryls. Then placed percutaneous pedicle screws over guidewires in standard fashion. We passed the ultrasonic guided pedicle all through the pedicle and placed guidewires through it. Removed the Jamshidi needle. Placed wires L3-L4 and L5 bilaterally and good position. We then tapped with a 6 mm tap and placed 6.5 mm x 40 mm screws at L3-L4 and L5 bilaterally. We then passed 60 mm rods down the towers which found to be in excellent length. Secure them to the top of the screws with top loading nuts. We did tightening and final tightening with torque and counter torque and then removed the [employer]. Final fluoroscopy looked excellent. We irrigated this or once more and closed the fascia with 0 Vicryls. We then left a drain in the suprafascial space and brought out through a separate stab wound incision. The wounds and closed in multiple layers of Vicryls and a running locking pulling on the skin. A sterile dressing was then applied the patient was extubated and taken to recovery in stable condition.

## 2015-02-28 NOTE — Plan of Care (Signed)
Problem: Consults Goal: Diagnosis - Spinal Surgery Outcome: Completed/Met Date Met:  02/28/15 Thoraco/Lumbar Spine Fusion

## 2015-02-28 NOTE — H&P (Signed)
[name] is an 69 y.o. male.   Chief Complaint: Left hip and leg pain HPI: The patient is a 69 year old gentleman who is evaluated in the office for left hip pain with radiation down the left leg. He's had problems for many years. He's had 2 low back surgeries both by an orthopedist in town in 2008. He got better for a short time and now has a 4 year history of progressive difficulty. He's been getting per numerous injections which become progressively less effective. He had an MRI scan earlier this year and now comes for evaluation. When seen in the office the patient's of his right leg was asymptomatic. He's had to use a cane for the last few months as is been getting progressively difficult to walk. In the office his MRI scan was reviewed which shows some postoperative change and severe stenosis and listhesis with scoliosis at L3-4 and L4-5. After discussing the options the patient requested surgery and only comes for a two-level decompression with interbody fusion and pedicle screw fixation. I've had a long discussion with him regarding the risks and benefits of surgical intervention. The risks discussed include but are not limited to bleeding infection weakness some as paralysis spinal fluid leak trouble with instrumentation nonunion coma and death. We have discussed alternative methods of therapy along with the risks and benefits of nonintervention. He's had the opportunity to ask numerous questions and appears to understand. With this information in hand he has requested that we proceed with surgery.  Past Medical History  Diagnosis Date  . CAD (coronary artery disease)     Dr. Donnie Aho  . GERD (gastroesophageal reflux disease)   . Hyperlipidemia   . Erectile dysfunction   . Depression   . ADD (attention deficit disorder)   . Hypogonadism male     prior use on Testim  . Hypertension   . Anemia     takes Fe -   . Anxiety     sees therapist Vernell Leep  . Allergic rhinitis    skin test 02/23/08 vaccine 2009; allergy shots weekly through Dr. Maple Hudson  . Asthma     Dr. Jetty Duhamel  . Insomnia     prior borderline sleep study  . Ankylosing spondylitis   . 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 Depression with anxiety   . Lumbar disc disease   . 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 History of benign positional vertigo   . Anginal pain   . COPD (chronic obstructive pulmonary disease)   . Shortness of breath dyspnea   . Pneumonia   . Kidney stone     pt states no problems showed up on a test once  . Arthritis     Past Surgical History  Procedure Laterality Date  . Lumbar disc surgery      x2  . Cardiac catheterization  2006    Dr. Donnie Aho  . Cholecystectomy    . Tonsillectomy      1953  . Vasectomy      1980  . Right index finger  mass removed- arthritis      Dr. Amanda Pea  . Colonoscopy    . Coronary angioplasty with stent placement      1999   . Coronary angioplasty      1995 after MI  . Cheilectomy  10/16/2011    Procedure: CHEILECTOMY;  Surgeon: Sherri Rad, MD;  Location: [facility];  Service: Orthopedics;  Laterality:  Right;  . Foot surgery      joint scraped, arthritis right foot  . Esophagogastroduodenoscopy  02/18/2012    Procedure: ESOPHAGOGASTRODUODENOSCOPY (EGD);  Surgeon: Florencia Reasons, MD;  Location: [facility];  Service: Endoscopy;  Laterality: N/A;  . Bravo ph study  02/18/2012    Procedure: BRAVO PH STUDY;  Surgeon: Florencia Reasons, MD;  Location: [facility];  Service: Endoscopy;  Laterality: N/A;  . Upper gi endoscopy  [id number]    Family History  Problem Relation Age of Onset  . Diabetes Mother   . Dementia Mother   . Heart disease Mother   . Heart disease Father   . Dementia Father   . Chronic fatigue Daughter   . Heart disease Sister     stent ,CAD  . Arthritis Sister   . Cancer Neg Hx    Social History:  reports that he has never smoked. He has never used smokeless  tobacco. He reports that he drinks alcohol. He reports that he does not use illicit drugs.  Allergies:  Allergies  Allergen Reactions  . Oxycodone Nausea And Vomiting    Medications Prior to Admission  Medication Sig Dispense Refill  . ALPRAZolam (XANAX) 0.25 MG tablet TAKE 1 TABLET THREE TIMES A DAY AS NEEDED FOR ANXIETY 30 tablet 1  . aspirin 81 MG tablet Take 81 mg by mouth daily.      [name] atorvastatin (LIPITOR) 80 MG tablet Take 80 mg by mouth daily.     [name] buPROPion (WELLBUTRIN XL) 300 MG 24 hr tablet Take 1 tablet by mouth  daily 90 tablet 3  . calcium carbonate (OS-CAL) 600 MG TABS tablet Take 600 mg by mouth daily with breakfast.    . cholecalciferol (VITAMIN D) 1000 UNITS tablet Take 1,000 Units by mouth daily.    . clopidogrel (PLAVIX) 75 MG tablet Take 75 mg by mouth daily.     [name] diltiazem (CARDIZEM CD) 240 MG 24 hr capsule Take 240 mg by mouth daily.      [name] GLUCOSAMINE CHONDROITIN COMPLX PO Take 1 tablet by mouth daily.    [name] guaiFENesin (MUCINEX) 600 MG 12 hr tablet Take 1,200 mg by mouth daily as needed.     . meclizine (ANTIVERT) 25 MG tablet Take 1 tablet by mouth  twice a day for dizziness 180 tablet 0  . [START ON 03/19/2015] methylphenidate (CONCERTA) 54 MG PO CR tablet Take 1 tablet (54 mg total) by mouth every morning. 30 tablet 0  . montelukast (SINGULAIR) 10 MG tablet Take 1 tablet by mouth at  bedtime 90 tablet 0  . Multiple Vitamins-Minerals (MULTIVITAMIN WITH MINERALS) tablet Take 1 tablet by mouth daily.    [name] PRISTIQ 50 MG 24 hr tablet Take 1 tablet by mouth  daily 90 tablet 1  . VOLTAREN 1 % GEL Apply 1 application topically 4 (four) times daily. As needed    . albuterol (VENTOLIN HFA) 108 (90 BASE) MCG/ACT inhaler Inhale 2 puffs into the lungs every 4 (four) hours as needed. 1 Inhaler 3  . budesonide-formoterol (SYMBICORT) 160-4.5 MCG/ACT inhaler Inhale 2 puffs into the lungs 2 (two) times daily. 1 Inhaler prn  . cetirizine (ZYRTEC) 10 MG tablet Take 10 mg by mouth  daily.      . nitroGLYCERIN (NITROSTAT) 0.4 MG SL tablet Place 0.4 mg under the tongue every 5 (five) minutes as needed.       Results for orders placed or performed during the hospital encounter of 02/20/15 (from the  past 48 hour(s))  I-STAT 4, (NA,K, GLUC, HGB,HCT)     Status: Abnormal   Collection Time: 02/28/15  7:37 AM  Result Value Ref Range   Sodium 142 135 - 145 mmol/L   Potassium 3.8 3.5 - 5.1 mmol/L   Glucose, Bld 102 (H) 65 - 99 mg/dL   HCT 16.1 09.6 - 04.5 %   Hemoglobin 13.9 13.0 - 17.0 g/dL   No results found.  Positive for asthma and depression nasal congestion and anxiety  Blood pressure 109/72, pulse 76, temperature 97.2 F (36.2 C), temperature source Oral, resp. rate 18, SpO2 95 %.  The patient is awake alert and oriented. His no facial asymmetry. His gait is slow and somewhat antalgic. He has a 2+ left knee jerk reflex only with absent reflexes otherwise. Sensation is intact. He has normal strength. Assessment/Plan Impression is that of severe stenosis with listhesis and scoliosis L3-4 and L4-5. The plan is for decompression with interbody fusion and instrumentation.  Reinaldo Meeker, MD 02/28/2015, 10:30 AM

## 2015-02-28 NOTE — Anesthesia Preprocedure Evaluation (Signed)
Anesthesia Evaluation  Patient identified by MRN, date of birth, ID band Patient awake    Reviewed: Allergy & Precautions, H&P , NPO status , Patient's Chart, lab work & pertinent test results  Airway Mallampati: I  TM Distance: >3 FB Neck ROM: Full    Dental no notable dental hx. (+) Teeth Intact, Dental Advisory Given   Pulmonary shortness of breath and with exertion, asthma , pneumonia, COPD,    Pulmonary exam normal breath sounds clear to auscultation       Cardiovascular hypertension, On Medications + angina + CAD and + Past MI   Rhythm:Regular Rate:Normal     Neuro/Psych  Headaches, PSYCHIATRIC DISORDERS Anxiety Depression    GI/Hepatic Neg liver ROS, GERD  Medicated and Controlled,  Endo/Other  negative endocrine ROS  Renal/GU Renal diseasenegative Renal ROS     Musculoskeletal  (+) Arthritis ,   Abdominal   Peds  Hematology negative hematology ROS (+) anemia ,   Anesthesia Other Findings   Reproductive/Obstetrics negative OB ROS                             Anesthesia Physical  Anesthesia Plan  ASA: III  Anesthesia Plan: General   Post-op Pain Management:    Induction: Intravenous  Airway Management Planned: LMA  Additional Equipment:   Intra-op Plan:   Post-operative Plan: Extubation in OR  Informed Consent: I have reviewed the patients History and Physical, chart, labs and discussed the procedure including the risks, benefits and alternatives for the proposed anesthesia with the patient or authorized representative who has indicated his/her understanding and acceptance.   Dental advisory given  Plan Discussed with: CRNA and Surgeon  Anesthesia Plan Comments:         Anesthesia Quick Evaluation

## 2015-03-01 MED ORDER — MECLIZINE HCL 12.5 MG PO TABS
25.0000 mg | ORAL_TABLET | Freq: Two times a day (BID) | ORAL | Status: DC
  Administered 2015-03-01 – 2015-03-03 (×4): 25 mg via ORAL
  Filled 2015-03-01 (×4): qty 2

## 2015-03-01 MED ORDER — PANTOPRAZOLE SODIUM 40 MG PO TBEC
40.0000 mg | DELAYED_RELEASE_TABLET | Freq: Every day | ORAL | Status: DC
  Administered 2015-03-01 – 2015-03-02 (×2): 40 mg via ORAL
  Filled 2015-03-01 (×2): qty 1

## 2015-03-01 NOTE — Progress Notes (Signed)
Left message at [facility] for Dr. Trudee Grip CMA regarding pt's at home prescription for meclizine. Will await response. Lawson Radar

## 2015-03-01 NOTE — Progress Notes (Signed)
Pt forgetting that he has hemovac and letting the hemovac collection container disconnect from tubing. Informed pt of risk for infection and pt states he understands, but canister found disconnected later in evening. Clipped container to pt's hospital gown. Lawson Radar

## 2015-03-01 NOTE — Evaluation (Signed)
Occupational Therapy Evaluation Patient Details Name: [name] MRN: [id number] DOB: [birth year] Today's Date: 03/01/2015    History of Present Illness 69 y.o. male s/p L3-4 L4-5 decompressive laminectomy for relief of lateral central stenosis with decompression of L3-L4 and L5 nerve root. Hx of vertigo, takes meclazine daily.   Clinical Impression   Pt was performing ADL at an independent level and ambulating with a cane just before surgery.  Presents with expected post op pain in his back and nausea with hx of vertigo for which he take medication regularly.  Pt has all necessary equipment at home including a sock aide and reacher.  He plans to sit on a 3 in 1 to shower.  Pt can cross his foot over opposite knee to reach his socks.  Will follow acutely to address standing ADL, ADL transfers and to reinforce back precautions.   Follow Up Recommendations  No OT follow up    Equipment Recommendations  None recommended by OT    Recommendations for Other Services       Precautions / Restrictions Precautions Precautions: Back;Fall Precaution Booklet Issued: Yes (comment) Precaution Comments: reviewed precautions related to ADL Required Braces or Orthoses: Spinal Brace Spinal Brace: Lumbar corset Restrictions Weight Bearing Restrictions: No      Mobility Bed Mobility        General bed mobility comments: pt in chair  Transfers Overall transfer level: Needs assistance Equipment used: Rolling walker (2 wheeled) Transfers: Sit to/from Stand Sit to Stand: Min guard         General transfer comment: pt with some periods of nausea, hx of vertigo    Balance Overall balance assessment: Needs assistance Sitting-balance support: No upper extremity supported;Feet supported Sitting balance-Leahy Scale: Good     Standing balance support: No upper extremity supported Standing balance-Leahy Scale: Fair                              ADL Overall ADL's :  Needs assistance/impaired Eating/Feeding: Independent;Sitting   Grooming: Min guard;Standing;Wash/dry hands   Upper Body Bathing: Set up;Sitting Upper Body Bathing Details (indicate cue type and reason): instructed in benefits of a long handled sponge Lower Body Bathing: Min guard;Sit to/from stand Lower Body Bathing Details (indicate cue type and reason): able to cross LE over opposite knee to reach feet with effort, recommended long bath sponge Upper Body Dressing : Set up;Sitting   Lower Body Dressing: Min guard;Sit to/from stand   Toilet Transfer: Min guard;Ambulation;Comfort height toilet;RW   Toileting- Clothing Manipulation and Hygiene: Minimal assistance;Sit to/from stand Toileting - Clothing Manipulation Details (indicate cue type and reason): assisted for gown, instructed to avoid twisting with pericare, recommended wet wipes and considering tongs as needed     Functional mobility during ADLs: Min guard;Rolling walker       Vision     Perception     Praxis      Pertinent Vitals/Pain Pain Assessment: 0-10 Pain Score: 6  Pain Location: back Pain Descriptors / Indicators: Operative site guarding;Sore Pain Intervention(s): Premedicated before session;Repositioned;Limited activity within patient's tolerance;Monitored during session     Hand Dominance Right   Extremity/Trunk Assessment Upper Extremity Assessment Upper Extremity Assessment: Overall WFL for tasks assessed (arthritic changes in hands)   Lower Extremity Assessment Lower Extremity Assessment: Defer to PT evaluation       Communication Communication Communication: No difficulties   Cognition Arousal/Alertness: Awake/alert Behavior During Therapy: WFL for tasks assessed/performed Overall  Cognitive Status: Within Functional Limits for tasks assessed                     General Comments       Exercises      Shoulder Instructions      Home Living Family/patient expects to be  discharged to:: Private residence Living Arrangements: Spouse/significant other Available Help at Discharge: Family;Available 24 hours/day Type of Home: House Home Access: Stairs to enter [employer] of Steps: 3 Entrance Stairs-Rails: None Home Layout: One level     Bathroom Shower/Tub: Walk-in Pensions consultant: Handicapped height     Home Equipment: Cane - single point;Walker - 2 wheels;Bedside commode;Adaptive equipment Adaptive Equipment: Sock aid;Reacher;Long-handled shoe horn (wife's)        Prior Functioning/Environment Level of Independence: Independent with assistive device(s)        Comments: Used cane several days prior to surgery    OT Diagnosis: Generalized weakness;Acute pain   OT Problem List: Decreased activity tolerance;Impaired balance (sitting and/or standing);Decreased knowledge of use of DME or AE;Pain   OT Treatment/Interventions: Self-care/ADL training;Therapeutic activities;Patient/family education;Balance training    OT Goals(Current goals can be found in the care plan section) Acute Rehab OT Goals Patient Stated Goal: pain free OT Goal Formulation: With patient Time For Goal Achievement: 03/08/15 Potential to Achieve Goals: Good ADL Goals Pt Will Perform Grooming: with supervision;standing Pt Will Perform Lower Body Bathing: with supervision;sit to/from stand Pt Will Perform Lower Body Dressing: with supervision;sit to/from stand Pt Will Transfer to Toilet: with supervision;ambulating Pt Will Perform Toileting - Clothing Manipulation and hygiene: with supervision;sit to/from stand Pt Will Perform Tub/Shower Transfer: Shower transfer;with supervision;rolling walker;3 in 1 Additional ADL Goal #1: Pt will state 3/3 back precautions.  OT Frequency: Min 2X/week   Barriers to D/C:            Co-evaluation              End of Session Equipment Utilized During Treatment: Gait belt;Rolling walker  Activity  Tolerance: Treatment limited secondary to medical complications (Comment) (nausea, RN aware of pt's regular use of meclazine) Patient left: in chair;with call bell/phone within reach;with nursing/sitter in room   Time: 0925-0950 OT Time Calculation (min): 25 min Charges:  OT General Charges $OT Visit: 1 Procedure OT Evaluation $Initial OT Evaluation Tier I: 1 Procedure OT Treatments $Self Care/Home Management : 8-22 mins G-Codes:    Evern Bio 03/01/2015, 10:21 AM  [phone]

## 2015-03-01 NOTE — Progress Notes (Signed)
Patient ID: [name], male   DOB: 05/12/1946, 69 y.o.   MRN: [id number] Afeb, vss No new neuro issues Leg pain completely resolved. Wound clean and dry. Will increase activity today and follow his progress.

## 2015-03-01 NOTE — Progress Notes (Signed)
Utilization review completed.  

## 2015-03-01 NOTE — Evaluation (Signed)
Physical Therapy Evaluation Patient Details Name: [name] MRN: [id number] DOB: 1945/12/08 Today's Date: 03/01/2015   History of Present Illness  69 y.o. male s/p L3-4 L4-5 decompressive laminectomy for relief of lateral central stenosis with decompression of L3-L4 and L5 nerve root   Clinical Impression  Patient is s/p above surgery resulting in functional limitations due to the deficits listed below (see PT Problem List). Ambulates up to 125 feet today with use of a rolling walker for support, moderately guarded due to back pain. Reviewed use of back brace and safe mobility techniques with transfers and gait. Patient will benefit from skilled PT to increase their independence and safety with mobility to allow discharge to the venue listed below.       Follow Up Recommendations Supervision - Intermittent;No PT follow up    Equipment Recommendations  None recommended by PT    Recommendations for Other Services       Precautions / Restrictions Precautions Precautions: Back (No order - taught for comfort) Precaution Booklet Issued: Yes (comment) Precaution Comments: reviewed Required Braces or Orthoses: Spinal Brace Spinal Brace: Lumbar corset Restrictions Weight Bearing Restrictions: No      Mobility  Bed Mobility Overal bed mobility: Needs Assistance Bed Mobility: Rolling;Sidelying to Sit Rolling: Supervision Sidelying to sit: Supervision       General bed mobility comments: Supervision for safety. VC for log roll technique. Requires use of rail.  Transfers Overall transfer level: Needs assistance Equipment used: Rolling walker (2 wheeled) Transfers: Sit to/from Stand Sit to Stand: Min guard         General transfer comment: Min guard for safety. VC for hand placement and to maintain neutral back alignment.  Ambulation/Gait Ambulation/Gait assistance: Min guard Ambulation Distance (Feet): 125 Feet Assistive device: Rolling walker (2 wheeled) Gait  Pattern/deviations: Step-through pattern;Decreased stride length;Narrow base of support;Trunk flexed Gait velocity: decreased Gait velocity interpretation: Below normal speed for age/gender General Gait Details: Educated on safe DME use with a rolling walker. VC for upright posture and to increase stride length. Moderately guarded but stable with this device.  Stairs            Wheelchair Mobility    Modified Rankin (Stroke Patients Only)       Balance Overall balance assessment: Needs assistance Sitting-balance support: No upper extremity supported;Feet supported Sitting balance-Leahy Scale: Fair     Standing balance support: No upper extremity supported Standing balance-Leahy Scale: Fair                               Pertinent Vitals/Pain Pain Assessment: 0-10 Pain Score: 6  Pain Location: back Pain Descriptors / Indicators: Operative site guarding;Sore Pain Intervention(s): Monitored during session;Repositioned    Home Living Family/patient expects to be discharged to:: Private residence Living Arrangements: Spouse/significant other Available Help at Discharge: Family;Available 24 hours/day Type of Home: House Home Access: Stairs to enter Entrance Stairs-Rails: None Entrance Stairs-Number of Steps: 3 Home Layout: One level Home Equipment: Cane - single point;Walker - 2 wheels;Bedside commode (3 - in - 1)      Prior Function Level of Independence: Independent with assistive device(s)         Comments: Used cane several days prior to surgery     Hand Dominance   Dominant Hand: Right    Extremity/Trunk Assessment   Upper Extremity Assessment: Defer to OT evaluation           Lower Extremity  Assessment: Overall WFL for tasks assessed         Communication   Communication: No difficulties  Cognition Arousal/Alertness: Awake/alert Behavior During Therapy: WFL for tasks assessed/performed Overall Cognitive Status: Within  Functional Limits for tasks assessed                      General Comments General comments (skin integrity, edema, etc.): Attempted to urinate but unable. Reviewed use of back brace    Exercises General Exercises - Lower Extremity Ankle Circles/Pumps: AROM;Both;10 reps;Seated      Assessment/Plan    PT Assessment Patient needs continued PT services  PT Diagnosis Difficulty walking;Abnormality of gait;Acute pain   PT Problem List Decreased strength;Decreased activity tolerance;Decreased balance;Decreased mobility;Decreased knowledge of use of DME;Decreased knowledge of precautions;Pain  PT Treatment Interventions DME instruction;Gait training;Stair training;Functional mobility training;Therapeutic activities;Therapeutic exercise;Balance training;Neuromuscular re-education;Patient/family education;Modalities   PT Goals (Current goals can be found in the Care Plan section) Acute Rehab PT Goals Patient Stated Goal: None stated PT Goal Formulation: With patient Time For Goal Achievement: 03/15/15 Potential to Achieve Goals: Good    Frequency Min 5X/week   Barriers to discharge        Co-evaluation               End of Session Equipment Utilized During Treatment: Back brace Activity Tolerance: Patient tolerated treatment well Patient left: in chair;with call bell/phone within reach Nurse Communication: Mobility status         Time: 1610-9604 PT Time Calculation (min) (ACUTE ONLY): 30 min   Charges:   PT Evaluation $Initial PT Evaluation Tier I: 1 Procedure PT Treatments $Gait Training: 8-22 mins   PT G CodesBerton Mount 03/01/2015, 9:54 AM Charlsie Merles, PT [phone]

## 2015-03-02 MED ORDER — DEXAMETHASONE 4 MG PO TABS
6.0000 mg | ORAL_TABLET | Freq: Four times a day (QID) | ORAL | Status: AC
  Administered 2015-03-02 – 2015-03-03 (×3): 6 mg via ORAL
  Filled 2015-03-02 (×6): qty 1

## 2015-03-02 NOTE — Progress Notes (Signed)
Physical Therapy Treatment Patient Details Name: [name] MRN: [id number] DOB: June 11, 1945 Today's Date: 03/02/2015    History of Present Illness 69 y.o. male s/p L3-4 L4-5 decompressive laminectomy for relief of lateral central stenosis with decompression of L3-L4 and L5 nerve root. Hx of vertigo, takes meclazine daily.    PT Comments    Patient progressing this session with mobility and stair education/practice.  Patient concerned about needing follow up PT due to wife having to help daughter and just a little nervous about follow through with precautions at home so changed recommendations to HHPT.  Will attempt to see tomorrow prior to d/c as able.  Follow Up Recommendations  Home health PT;Supervision - Intermittent     Equipment Recommendations  None recommended by PT    Recommendations for Other Services       Precautions / Restrictions Precautions Precautions: Back;Fall Required Braces or Orthoses: Spinal Brace Spinal Brace: Lumbar corset Restrictions Weight Bearing Restrictions: No    Mobility  Bed Mobility Overal bed mobility: Needs Assistance Bed Mobility: Sit to Sidelying         Sit to sidelying: Supervision General bed mobility comments: cues for technique  Transfers   Equipment used: Rolling walker (2 wheeled) Transfers: Sit to/from Stand Sit to Stand: Supervision         General transfer comment: increased time, reminder for hand placement  Ambulation/Gait Ambulation/Gait assistance: Supervision Ambulation Distance (Feet): 240 Feet Assistive device: Rolling walker (2 wheeled) Gait Pattern/deviations: Step-through pattern;Decreased stride length     General Gait Details: increased time on turns, cues for proximity to walker and posture   Stairs Stairs: Yes Stairs assistance: Min assist Stair Management: Backwards;With walker Number of Stairs: 2 (x 2) General stair comments: cues for technique and sequence, assist to hold  walker  Wheelchair Mobility    Modified Rankin (Stroke Patients Only)       Balance           Standing balance support: No upper extremity supported;During functional activity (stood for toileting ) Standing balance-Leahy Scale: Fair                      Cognition Arousal/Alertness: Awake/alert Behavior During Therapy: WFL for tasks assessed/performed Overall Cognitive Status: Within Functional Limits for tasks assessed                      Exercises      General Comments        Pertinent Vitals/Pain Pain Score: 6  Pain Location: back and right hip Pain Descriptors / Indicators: Sore Pain Intervention(s): Monitored during session;Repositioned    Home Living                      Prior Function            PT Goals (current goals can now be found in the care plan section) Progress towards PT goals: Progressing toward goals    Frequency  Min 5X/week    PT Plan Discharge plan needs to be updated    Co-evaluation             End of Session Equipment Utilized During Treatment: Back brace Activity Tolerance: Patient tolerated treatment well Patient left: in bed;with call bell/phone within reach     Time: 1010-1037 PT Time Calculation (min) (ACUTE ONLY): 27 min  Charges:  $Gait Training: 8-22 mins $Therapeutic Activity: 8-22 mins  G Codes:      WYNN,CYNDI 03/02/2015, 10:41 AM  Sheran Lawless, PT [phone] 03/02/2015

## 2015-03-02 NOTE — Anesthesia Postprocedure Evaluation (Signed)
Anesthesia Post Note  Patient: [name]  Procedure(s) Performed: Procedure(s) (LRB): LUMBAR THREE-FOUR, LUMBAR FOUR-FIVE POSTERIOR LUMBAR FUSION  (N/A)  Anesthesia type: General  Patient location: PACU  Post pain: Pain level controlled  Post assessment: Post-op Vital signs reviewed  Last Vitals: BP 116/66 mmHg  Pulse 103  Temp(Src) 36.8 C (Oral)  Resp 18  Ht  (1.702 m)  Wt 167 lb (75.751 kg)  BMI 26.15 kg/m2  SpO2 94%  Post vital signs: Reviewed  Level of consciousness: sedated  Complications: No apparent anesthesia complications

## 2015-03-02 NOTE — Progress Notes (Signed)
Patient ID: [name], male   DOB: [birth year], 69 y.o.   MRN: [id number] Afeb, vss No new neuro issues Increasing activity slowly. Wound clean and dry. Will d/c hemovac today. Plan d/c tomorrow.

## 2015-03-02 NOTE — Progress Notes (Signed)
Occupational Therapy Treatment Patient Details Name: [name] MRN: [id number] DOB: Nov 17, 1945 Today's Date: 03/02/2015    History of present illness 69 y.o. male s/p L3-4 L4-5 decompressive laminectomy for relief of lateral central stenosis with decompression of L3-L4 and L5 nerve root. Hx of vertigo, takes meclazine daily.   OT comments  Pt admitted with above. Pt is demonstrating progress towards goals with pt able to tolerate activity this session without increase in pain and no nausea.  Completed bathing and dressing at sit > stand level with pt able to verbalize and adhere to 3/3 back precautions during self-care tasks.  Educated on strategies to increase success with LB dressing.  Pt completed toilet transfer and urinated in standing with supervision.  Performed walk-in shower transfer with pt stepping over shower ledge backwards with use of RW and 3 in 1 in shower.  Pt reports this is the setup he has at home and demonstrated safety with transfer.  Pt will benefit from OT acutely to further address ADL transfers and adherence to back precautions with functional tasks.   Follow Up Recommendations  No OT follow up    Equipment Recommendations  None recommended by OT    Recommendations for Other Services      Precautions / Restrictions Precautions Precautions: Back;Fall Required Braces or Orthoses: Spinal Brace Spinal Brace: Lumbar corset Restrictions Weight Bearing Restrictions: No       Mobility Bed Mobility Overal bed mobility: Needs Assistance Bed Mobility: Sit to Sidelying         Sit to sidelying: Supervision General bed mobility comments: cues for technique  Transfers   Equipment used: Rolling walker (2 wheeled) Transfers: Sit to/from Stand Sit to Stand: Supervision         General transfer comment: increased time, reminder for hand placement    Balance           Standing balance support: No upper extremity supported;During functional  activity (stood for toileting ) Standing balance-Leahy Scale: Fair                     ADL Overall ADL's : Needs assistance/impaired     Grooming: Supervision/safety;Standing;Wash/dry face;Wash/dry hands   Upper Body Bathing: Supervision/ safety;Set up;Sitting   Lower Body Bathing: Supervison/ safety;Set up;Sit to/from stand Lower Body Bathing Details (indicate cue type and reason): able to cross LE over opposite knee to reach feet with effort, recommended long bath sponge with pt declining Upper Body Dressing : Sitting;Supervision/safety   Lower Body Dressing: Min guard;Sit to/from stand Lower Body Dressing Details (indicate cue type and reason): able to cross LE over opposte knee to reach feet when donning underwear and pants, cues for technique to increase independence Toilet Transfer: Ambulation;Comfort height toilet;RW;Min guard   Toileting- Clothing Manipulation and Hygiene: Supervision/safety Toileting - Clothing Manipulation Details (indicate cue type and reason): urinated in standing, with pt able to complete clothing management for voiding Tub/ Shower Transfer: Walk-in shower;Min guard;Adhering to back precautions;3 in 1;Grab bars;Rolling walker   Functional mobility during ADLs: Min guard;Rolling walker                  Cognition   Behavior During Therapy: [facility] for tasks assessed/performed Overall Cognitive Status: Within Functional Limits for tasks assessed                                    Pertinent Vitals/ Pain  Pain Score: 6  Pain Location: back and right hip Pain Descriptors / Indicators: Sore Pain Intervention(s): Monitored during session;Repositioned         Frequency Min 2X/week     Progress Toward Goals  OT Goals(current goals can now be found in the care plan section)  Progress towards OT goals: Progressing toward goals  Acute Rehab OT Goals Patient Stated Goal: pain free OT Goal Formulation: With patient Time  For Goal Achievement: 03/08/15 Potential to Achieve Goals: Good  Plan Discharge plan remains appropriate;Frequency remains appropriate       End of Session Equipment Utilized During Treatment: Rolling walker;Back brace   Activity Tolerance Patient tolerated treatment well;No increased pain   Patient Left in chair;with call bell/phone within reach           Time: 1610-9604 OT Time Calculation (min): 40 min  Charges: OT General Charges $OT Visit: 1 Procedure OT Treatments $Self Care/Home Management : 38-52 mins (40)  Rosalio Loud, [phone] 03/02/2015, 10:49 AM

## 2015-03-03 MED ORDER — HYDROCODONE-ACETAMINOPHEN 5-325 MG PO TABS: 1.0000 | ORAL_TABLET | ORAL | Status: DC | PRN

## 2015-03-03 NOTE — Progress Notes (Signed)
Discharge orders received, Pt for discharge home today. IV d/c'd. D/c instructions and RX given with verbalized understanding. Family at bedside to assist patient with discharge. Staff bought pt downstairs via wheelchair. 03/04/15

## 2015-03-03 NOTE — Progress Notes (Signed)
Physical Therapy Treatment Patient Details Name: [name] MRN: [id number] DOB: 1946/03/07 Today's Date: 03/03/2015    History of Present Illness 69 y.o. male s/p L3-4 L4-5 decompressive laminectomy for relief of lateral central stenosis with decompression of L3-L4 and L5 nerve root. Hx of vertigo, takes meclazine daily.    PT Comments    Continues to make steady progress towards functional goals. Develops a right trendelenburg as distance increases while ambulating. Mobilizing well while maintaining precautions. Reviewed stair navigation and was able to perform this task safely with wife's assist to block RW. Tolerated exercises well. Adequate for d/c from mobility standpoint when medically ready.  Follow Up Recommendations  No PT follow up;Supervision - Intermittent     Equipment Recommendations  None recommended by PT    Recommendations for Other Services       Precautions / Restrictions Precautions Precautions: Back;Fall Precaution Comments: Recalls 3/3 precautions Required Braces or Orthoses: Spinal Brace Spinal Brace: Lumbar corset Restrictions Weight Bearing Restrictions: No    Mobility  Bed Mobility Overal bed mobility: Needs Assistance Bed Mobility: Rolling;Sidelying to Sit;Sit to Sidelying Rolling: Supervision Sidelying to sit: Supervision     Sit to sidelying: Supervision General bed mobility comments: Supervision for safety. VC for technique for log roll. did not require assist.  Transfers Overall transfer level: Needs assistance Equipment used: Rolling walker (2 wheeled) Transfers: Sit to/from Stand Sit to Stand: Supervision         General transfer comment: Supervision for safety. Slightly guarded. VC for hand placement and to maintain back preacutions  Ambulation/Gait Ambulation/Gait assistance: Supervision Ambulation Distance (Feet): 250 Feet Assistive device: Rolling walker (2 wheeled) Gait Pattern/deviations: Step-through  pattern;Decreased stride length;Antalgic;Trendelenburg Gait velocity: decreased Gait velocity interpretation: Below normal speed for age/gender General Gait Details: Demonstrates a right trendelenburg as distance increases. VC for walker placement for proximity. Good control of RW with turns. No buckling noted.   Stairs Stairs: Yes Stairs assistance: Min assist Stair Management: No rails;Step to pattern;Backwards;With walker Number of Stairs: 2 (x2) General stair comments: Reviewed cues for technique and sequencing. Wife present and provided min assist to block RW. pt states he feels confident with this task.  Wheelchair Mobility    Modified Rankin (Stroke Patients Only)       Balance                                    Cognition Arousal/Alertness: Awake/alert Behavior During Therapy: WFL for tasks assessed/performed Overall Cognitive Status: Within Functional Limits for tasks assessed                      Exercises General Exercises - Lower Extremity Ankle Circles/Pumps: AROM;Both;10 reps;Seated Gluteal Sets: Strengthening;Both;10 reps;Seated Long Arc Quad: Strengthening;Both;10 reps;Seated    General Comments General comments (skin integrity, edema, etc.): Reivewed application of brace      Pertinent Vitals/Pain Pain Assessment: 0-10 Pain Score: 3  Pain Location: back and right hip Pain Descriptors / Indicators: Sore Pain Intervention(s): Monitored during session;Repositioned    Home Living                      Prior Function            PT Goals (current goals can now be found in the care plan section) Acute Rehab PT Goals PT Goal Formulation: With patient Time For Goal Achievement: 03/15/15 Potential to Achieve Goals:  Good Progress towards PT goals: Progressing toward goals    Frequency  Min 5X/week    PT Plan Discharge plan needs to be updated    Co-evaluation             End of Session Equipment Utilized  During Treatment: Back brace Activity Tolerance: Patient tolerated treatment well Patient left: in chair;with call bell/phone within reach;with family/visitor present     Time: 1201-1223 PT Time Calculation (min) (ACUTE ONLY): 22 min  Charges:  $Gait Training: 8-22 mins                    G Codes:      Berton Mount 03-24-15, 12:38 PM [facility] [place], [place] [phone]

## 2015-03-03 NOTE — Discharge Summary (Signed)
Physician Discharge Summary  Patient ID: [name] MRN: [id number] DOB/AGE: [birth year] 69 y.o.  Admit date: 02/28/2015 Discharge date: 03/03/2015  Admission Diagnoses:  Discharge Diagnoses:  Active Problems:   Spondylolisthesis of lumbar region   Discharged Condition: good  Hospital Course: Surgery 3 days ago for 2 level lumbar fusion. Did well. Steadily increased activity. Wound healed well. By pod 3, ambulating well, wound healing well, pain well controlled. Home with specific instructions given.  Consults: None  Significant Diagnostic Studies: none  Treatments: surgery: L 34 L 45 plif  Discharge Exam: Blood pressure 123/71, pulse 98, temperature 98.1 F (36.7 C), temperature source Oral, resp. rate 20, height  (1.702 m), weight 75.751 kg (167 lb), SpO2 98 %. Incision/Wound:clean and dry; no new neuro issues  Disposition: 01-Home or Self Care     Medication List    ASK your doctor about these medications        albuterol 108 (90 BASE) MCG/ACT inhaler  Commonly known as:  VENTOLIN HFA  Inhale 2 puffs into the lungs every 4 (four) hours as needed.     ALPRAZolam 0.25 MG tablet  Commonly known as:  XANAX  TAKE 1 TABLET THREE TIMES A DAY AS NEEDED FOR ANXIETY     aspirin 81 MG tablet  Take 81 mg by mouth daily.     atorvastatin 80 MG tablet  Commonly known as:  LIPITOR  Take 80 mg by mouth daily.     budesonide-formoterol 160-4.5 MCG/ACT inhaler  Commonly known as:  SYMBICORT  Inhale 2 puffs into the lungs 2 (two) times daily.     buPROPion 300 MG 24 hr tablet  Commonly known as:  WELLBUTRIN XL  Take 1 tablet by mouth  daily     calcium carbonate 600 MG Tabs tablet  Commonly known as:  OS-CAL  Take 600 mg by mouth daily with breakfast.     cetirizine 10 MG tablet  Commonly known as:  ZYRTEC  Take 10 mg by mouth daily.     cholecalciferol 1000 UNITS tablet  Commonly known as:  VITAMIN D  Take 1,000 Units by mouth daily.     clopidogrel 75 MG tablet  Commonly known as:  PLAVIX  Take 75 mg by mouth daily.     diltiazem 240 MG 24 hr capsule  Commonly known as:  CARDIZEM CD  Take 240 mg by mouth daily.     GLUCOSAMINE CHONDROITIN COMPLX PO  Take 1 tablet by mouth daily.     guaiFENesin 600 MG 12 hr tablet  Commonly known as:  MUCINEX  Take 1,200 mg by mouth daily as needed.     meclizine 25 MG tablet  Commonly known as:  ANTIVERT  Take 1 tablet by mouth  twice a day for dizziness     methylphenidate 54 MG CR tablet  Commonly known as:  CONCERTA  Take 1 tablet (54 mg total) by mouth every morning.  Start taking on:  03/19/2015     montelukast 10 MG tablet  Commonly known as:  SINGULAIR  Take 1 tablet by mouth at  bedtime     multivitamin with minerals tablet  Take 1 tablet by mouth daily.     nitroGLYCERIN 0.4 MG SL tablet  Commonly known as:  NITROSTAT  Place 0.4 mg under the tongue every 5 (five) minutes as needed.     PRISTIQ 50 MG 24 hr tablet  Generic drug:  desvenlafaxine  Take 1 tablet by mouth  daily  VOLTAREN 1 % Gel  Generic drug:  diclofenac sodium  Apply 1 application topically 4 (four) times daily. As needed         At home rest most of the time. Get up 9 or 10 times each day and take a 15 or 20 minute walk. No riding in the car and to your first postoperative appointment. If you have neck surgery you may shower from the chest down starting on the third postoperative day. If you had back surgery he may start showering on the third postoperative day with saran wrap wrapped around your incisional area 3 times. After the shower remove the saran wrap. Take pain medicine as needed and other medications as instructed. Call my office for an appointment.  Signed: Reinaldo Meeker, MD 03/03/2015, 12:41 PM

## 2015-03-06 ENCOUNTER — Ambulatory Visit: Payer: Medicare Other | Admitting: Internal Medicine

## 2015-03-08 ENCOUNTER — Encounter: Payer: Self-pay | Admitting: Family Medicine

## 2015-03-28 ENCOUNTER — Other Ambulatory Visit: Payer: Self-pay | Admitting: Family Medicine

## 2015-03-29 NOTE — Telephone Encounter (Signed)
Is this ok to refill?  

## 2015-03-30 NOTE — Telephone Encounter (Signed)
Okay 

## 2015-04-21 ENCOUNTER — Encounter: Payer: Self-pay | Admitting: Internal Medicine

## 2015-04-21 ENCOUNTER — Ambulatory Visit (INDEPENDENT_AMBULATORY_CARE_PROVIDER_SITE_OTHER): Payer: Medicare Other | Admitting: Internal Medicine

## 2015-04-21 VITALS — BP 108/72 | HR 95 | Ht 69.0 in | Wt 169.2 lb

## 2015-04-21 DIAGNOSIS — J4520 Mild intermittent asthma, uncomplicated: Secondary | ICD-10-CM | POA: Diagnosis not present

## 2015-04-21 DIAGNOSIS — J301 Allergic rhinitis due to pollen: Secondary | ICD-10-CM | POA: Diagnosis not present

## 2015-04-21 DIAGNOSIS — Z23 Encounter for immunization: Secondary | ICD-10-CM

## 2015-04-21 NOTE — Patient Instructions (Signed)
Flu vax  Please call if needed

## 2015-04-21 NOTE — Assessment & Plan Note (Signed)
Mild intermittent uncomplicated well-controlled 

## 2015-04-21 NOTE — Assessment & Plan Note (Addendum)
Off allergy vaccine Discussed OTC symptomatic management as needed

## 2015-04-21 NOTE — Progress Notes (Signed)
Patient ID: [name], male    DOB: May 22, 1946, 69 y.o.   MRN: [id number]  HPI 12/18/10- 50 yo M followed for asthma, allergic rhinitis complicated by hx ADHD, CAD/ MI, GERD Last here June 21, 2010  Continues allergy vaccine 1:2 GH. In last 2-3 weeks, if he rushes or gets tense, he coughs and gets chest pains, especially going in and out of heat. Describes the pains as an airway soreness with some chest muscle soreness with increased raspy cough. Wife notes his raspy throat clearing. Mostly chest and throat, with persistent, "controlled" nasal drainage. He does garden and maintain yard. Has trouble remembering Symbicort.   [birth year] yo M never smoker followed for asthma, allergic rhinitis complicated by hx ADHD, CAD/ MI, GERD Had flu vaccine. He notices what feel like chest wall pains while lying in bed, in cold air or after exertion. He tends to cough for about 15 minutes after he first lies down, nonproductive and without awareness of reflux or postnasal drip. Aware of bruxism during the daytime which he will discuss with his dentist.  12/03/11- 46 yo M never smoker followed for asthma, allergic rhinitis complicated by hx ADHD, CAD/ MI, GERD Hoarseness, cough-non productive x 1.5 weeks; also has chest tightness. Uncomfortable singing so as stayed out of choir practice for the past 2 weeks feeling course with some dry cough and chest tightness. Illness began with a headache. Had taken a Z-Pak and Mucinex DM and Biaxin. Benzonatate may have helped a little. He has now finished a prednisone taper. No purulent sputum, bad sore throat or fever. Wife has remained well with no obvious viral syndrome in the family. CXR 10/14/11- stable, negative except mild cardiac enlargement. Continues allergy vaccine 1: 2, GH.  03/05/13- 69 yo M never smoker followed for asthma, allergic rhinitis complicated by hx ADHD, CAD/ MI, GERD FOLLOWS FOR: still on Allergy vaccine 1:2 GH and doing well. Would like to  go back on Symbicort instead of Dulera as it costs the same amount No need for rescue inhaler in a long time. Dropped off of acid blocker, comfortable. CXR 10/06/12 IMPRESSION:  No significant abnormality.  Original Report Authenticated By: Francene Boyers, M.D.  03/04/14- 45 yo M never smoker followed for asthma, allergic rhinitis complicated by hx ADHD, CAD/ MI, GERD, anemia                Allergy vaccine 1:2 GH  FOLLOWS FOR: still on vaccine and doing well; no flare ups. Uses Symbicort prn now. Discussed allergy vaccine, medications, pneumonia vaccine. Good year, well controlled. No concerns voiced.   04/21/15-69 yo M never smoker followed for asthma, allergic rhinitis complicated by hx ADHD, CAD/ MI, GERD, anemia   FOLLOWS FOR: Pt states that he has been doing well off the allergy vaccine. No complaints.  Allergy vaccine dc'd 01/04/15 for observation He has been indoors following spine surgery in July with little exposure to outdoor allergens or weather changes. Denies wheezing or rhinitis symptoms. Has not needed inhalers at all. As continue guaifenesin for thick mucus and mild occasional cough.  Review of Systems-see HPI Constitutional:   No-   weight loss, night sweats, fevers, chills, fatigue, lassitude. HEENT:   No-current  headaches, difficulty swallowing, tooth/dental problems, sore throat,       No-  sneezing, itching, ear ache, no-nasal congestion, post nasal drip,  CV:  No-chest pain, No- orthopnea, PND, swelling in lower extremities, anasarca, dizziness, palpitations Resp: No-   shortness of breath with  exertion or at rest.              No-   productive cough,  + non-productive cough,  No- coughing up of blood.              No-   change in color of mucus.  No- wheezing.   Skin: No-   rash or lesions. GI:  No-   heartburn, indigestion, abdominal pain, nausea, vomiting,  GU:  MS:  No-   joint pain or swelling. Neuro-     nothing unusual Psych:  No- change in mood or affect. No  depression or anxiety.  No memory loss.     Objective:   Physical Exam General- Alert, Oriented, Affect-appropriate, Distress- none acute Skin- rash-none, lesions- none, excoriation- none Lymphadenopathy- none Head- atraumatic            Eyes- Gross vision intact, PERRLA, conjunctivae clear secretions            Ears- Hearing, canals-normal            Nose- Clear, no-Septal dev, mucus, polyps, erosion, perforation             Throat- Mallampati II , mucosa clear, drainage- none, tonsils- atrophic.  Neck- flexible , trachea midline, no stridor , thyroid nl, carotid no bruit Chest - symmetrical excursion , unlabored           Heart/CV- RRR , no murmur , no gallop  , no rub, nl s1 s2                           - JVD- none , edema- none, stasis changes- none, varices- none           Lung- clear to P&A, good airflow,wheeze- none, cough- none , dullness-none, rub- none           Chest wall-  Abd-+ back brace/abdominal binder Br/ Gen/ Rectal- Not done, not indicated Extrem- Normal apparent strength and mobility Neuro- grossly intact to observation

## 2015-04-25 ENCOUNTER — Encounter: Payer: Self-pay | Admitting: Internal Medicine

## 2015-05-08 ENCOUNTER — Other Ambulatory Visit: Payer: Self-pay | Admitting: Internal Medicine

## 2015-05-08 ENCOUNTER — Other Ambulatory Visit: Payer: Self-pay | Admitting: Family Medicine

## 2015-05-08 ENCOUNTER — Telehealth: Payer: Self-pay

## 2015-05-08 MED ORDER — METHYLPHENIDATE HCL ER (OSM) 36 MG PO TBCR: 36.0000 mg | EXTENDED_RELEASE_TABLET | Freq: Every day | ORAL | Status: DC

## 2015-05-08 MED ORDER — METHYLPHENIDATE HCL ER (OSM) 54 MG PO TBCR
54.0000 mg | EXTENDED_RELEASE_TABLET | ORAL | Status: DC
Start: 2015-05-08 — End: 2015-09-14

## 2015-05-08 NOTE — Telephone Encounter (Signed)
Is this okay?

## 2015-05-08 NOTE — Telephone Encounter (Signed)
Pt called requesting a refill on Concerta 54mg  PO CR #30. He uses the [facility] on [address]

## 2015-05-09 ENCOUNTER — Telehealth: Payer: Self-pay | Admitting: Family Medicine

## 2015-05-09 NOTE — Telephone Encounter (Signed)
Left message for pt/ rx is ready for pick up.

## 2015-05-11 ENCOUNTER — Other Ambulatory Visit: Payer: Self-pay | Admitting: Family Medicine

## 2015-05-12 NOTE — Telephone Encounter (Signed)
Is this okay to call in? 

## 2015-05-15 NOTE — Telephone Encounter (Signed)
Okay to renew

## 2015-05-16 NOTE — Telephone Encounter (Signed)
Called med into pharmacy

## 2015-06-07 ENCOUNTER — Ambulatory Visit
Admission: RE | Admit: 2015-06-07 | Discharge: 2015-06-07 | Disposition: A | Discharge status: Home / Self Care | Payer: Medicare Other | Source: Ambulatory Visit | Attending: Neurosurgery | Admitting: Neurosurgery

## 2015-06-07 ENCOUNTER — Other Ambulatory Visit: Payer: Self-pay | Admitting: Neurosurgery

## 2015-06-07 DIAGNOSIS — M4316 Spondylolisthesis, lumbar region: Secondary | ICD-10-CM

## 2015-06-07 MED ORDER — GADOBENATE DIMEGLUMINE 529 MG/ML IV SOLN: 15.0000 mL | Freq: Once | INTRAVENOUS | Status: DC | PRN

## 2015-07-25 ENCOUNTER — Other Ambulatory Visit: Payer: Self-pay | Admitting: Family Medicine

## 2015-08-18 ENCOUNTER — Other Ambulatory Visit: Payer: Self-pay | Admitting: Family Medicine

## 2015-08-18 NOTE — Telephone Encounter (Signed)
Is this okay to call in? 

## 2015-08-20 NOTE — Telephone Encounter (Signed)
Okay to renew

## 2015-08-21 NOTE — Telephone Encounter (Signed)
Called in med to pharmacy  

## 2015-08-31 ENCOUNTER — Telehealth: Payer: Self-pay | Admitting: Family Medicine

## 2015-08-31 NOTE — Telephone Encounter (Signed)
Pt states that insurance will no longer cover Pristiq and he is completely out of this med. Pt wants to know if we can help get this med covered or change to something else ASAP and sent to local pharmacy at [facility] [address]?

## 2015-09-01 ENCOUNTER — Telehealth: Payer: Self-pay | Admitting: Family Medicine

## 2015-09-01 NOTE — Telephone Encounter (Signed)
P.A. Completed, also gave pt samples of Pristiq ok per Vickie to hold him while waiting on P.A.

## 2015-09-01 NOTE — Telephone Encounter (Signed)
Called pt regarding his P.A. Pristiq and he wanted to let you know that he's doing physical therapy and also they are doing dry needling and it is helping his pain

## 2015-09-03 NOTE — Telephone Encounter (Signed)
Recv'd response back that P.A. Not required, Pristiq is on list of covered meds

## 2015-09-14 ENCOUNTER — Telehealth: Payer: Self-pay | Admitting: Family Medicine

## 2015-09-14 MED ORDER — METHYLPHENIDATE HCL ER (OSM) 54 MG PO TBCR: 54.0000 mg | EXTENDED_RELEASE_TABLET | ORAL | Status: DC

## 2015-09-14 MED ORDER — METHYLPHENIDATE HCL ER (OSM) 36 MG PO TBCR: 36.0000 mg | EXTENDED_RELEASE_TABLET | Freq: Every day | ORAL | Status: DC

## 2015-09-14 NOTE — Telephone Encounter (Signed)
Pt advised rx is ready 

## 2015-09-14 NOTE — Telephone Encounter (Signed)
Requesting 3 months refill on Concerta. Call [phone] when scripts ready for pick up

## 2015-09-18 ENCOUNTER — Telehealth: Payer: Self-pay | Admitting: Family Medicine

## 2015-09-18 NOTE — Telephone Encounter (Signed)
Called pt & informed is on his covered list of meds,  He will try and fill and call back if there are any problems

## 2015-09-18 NOTE — Telephone Encounter (Signed)
error 

## 2015-09-27 ENCOUNTER — Other Ambulatory Visit: Payer: Self-pay | Admitting: Internal Medicine

## 2015-09-28 ENCOUNTER — Other Ambulatory Visit: Payer: Self-pay | Admitting: Family Medicine

## 2015-09-28 NOTE — Telephone Encounter (Signed)
Is this okay to refill? 

## 2015-09-28 NOTE — Telephone Encounter (Signed)
Make sure he has an appointment set up in the next month or 2

## 2015-10-10 ENCOUNTER — Ambulatory Visit (INDEPENDENT_AMBULATORY_CARE_PROVIDER_SITE_OTHER): Payer: Medicare Other | Admitting: Family Medicine

## 2015-10-10 ENCOUNTER — Encounter: Payer: Self-pay | Admitting: Family Medicine

## 2015-10-10 VITALS — BP 110/70 | HR 88 | Ht 66.5 in | Wt 171.0 lb

## 2015-10-10 DIAGNOSIS — M1990 Unspecified osteoarthritis, unspecified site: Secondary | ICD-10-CM

## 2015-10-10 DIAGNOSIS — J4520 Mild intermittent asthma, uncomplicated: Secondary | ICD-10-CM | POA: Diagnosis not present

## 2015-10-10 DIAGNOSIS — Z1159 Encounter for screening for other viral diseases: Secondary | ICD-10-CM

## 2015-10-10 DIAGNOSIS — J301 Allergic rhinitis due to pollen: Secondary | ICD-10-CM | POA: Diagnosis not present

## 2015-10-10 DIAGNOSIS — F900 Attention-deficit hyperactivity disorder, predominantly inattentive type: Secondary | ICD-10-CM | POA: Diagnosis not present

## 2015-10-10 DIAGNOSIS — F418 Other specified anxiety disorders: Secondary | ICD-10-CM

## 2015-10-10 DIAGNOSIS — K219 Gastro-esophageal reflux disease without esophagitis: Secondary | ICD-10-CM | POA: Diagnosis not present

## 2015-10-10 DIAGNOSIS — I252 Old myocardial infarction: Secondary | ICD-10-CM | POA: Diagnosis not present

## 2015-10-10 DIAGNOSIS — I10 Essential (primary) hypertension: Secondary | ICD-10-CM | POA: Diagnosis not present

## 2015-10-10 DIAGNOSIS — F909 Attention-deficit hyperactivity disorder, unspecified type: Secondary | ICD-10-CM

## 2015-10-10 DIAGNOSIS — R5382 Chronic fatigue, unspecified: Secondary | ICD-10-CM

## 2015-10-10 DIAGNOSIS — E785 Hyperlipidemia, unspecified: Secondary | ICD-10-CM

## 2015-10-10 DIAGNOSIS — H8110 Benign paroxysmal vertigo, unspecified ear: Secondary | ICD-10-CM

## 2015-10-10 DIAGNOSIS — Z0000 Encounter for general adult medical examination without abnormal findings: Secondary | ICD-10-CM

## 2015-10-10 DIAGNOSIS — Z209 Contact with and (suspected) exposure to unspecified communicable disease: Secondary | ICD-10-CM

## 2015-10-10 DIAGNOSIS — M519 Unspecified thoracic, thoracolumbar and lumbosacral intervertebral disc disorder: Secondary | ICD-10-CM

## 2015-10-10 LAB — CBC WITH DIFFERENTIAL/PLATELET
BASOS PCT: 0 %
Basophils Absolute: 0 cells/uL (ref 0–200)
Eosinophils Absolute: 210 cells/uL (ref 15–500)
Eosinophils Relative: 3 %
HEMATOCRIT: 40.8 % (ref 38.5–50.0)
HEMOGLOBIN: 13.4 g/dL (ref 13.2–17.1)
LYMPHS ABS: 2030 {cells}/uL (ref 850–3900)
Lymphocytes Relative: 29 %
MCH: 27.9 pg (ref 27.0–33.0)
MCHC: 32.8 g/dL (ref 32.0–36.0)
MCV: 84.8 fL (ref 80.0–100.0)
MONO ABS: 630 {cells}/uL (ref 200–950)
MPV: 9.5 fL (ref 7.5–12.5)
Monocytes Relative: 9 %
NEUTROS ABS: 4130 {cells}/uL (ref 1500–7800)
NEUTROS PCT: 59 %
Platelets: 270 10*3/uL (ref 140–400)
RBC: 4.81 MIL/uL (ref 4.20–5.80)
RDW: 16.2 % — ABNORMAL HIGH (ref 11.0–15.0)
WBC: 7 10*3/uL (ref 4.0–10.5)

## 2015-10-10 LAB — COMPREHENSIVE METABOLIC PANEL
ALBUMIN: 4.4 g/dL (ref 3.6–5.1)
ALK PHOS: 69 U/L (ref 40–115)
ALT: 23 U/L (ref 9–46)
AST: 21 U/L (ref 10–35)
BILIRUBIN TOTAL: 0.4 mg/dL (ref 0.2–1.2)
BUN: 14 mg/dL (ref 7–25)
CALCIUM: 9.1 mg/dL (ref 8.6–10.3)
CO2: 25 mmol/L (ref 20–31)
CREATININE: 0.99 mg/dL (ref 0.70–1.25)
Chloride: 107 mmol/L (ref 98–110)
Glucose, Bld: 91 mg/dL (ref 65–99)
Potassium: 4.4 mmol/L (ref 3.5–5.3)
SODIUM: 140 mmol/L (ref 135–146)
Total Protein: 6.7 g/dL (ref 6.1–8.1)

## 2015-10-10 LAB — LIPID PANEL
CHOL/HDL RATIO: 3.2 ratio (ref <=5.0)
CHOLESTEROL: 119 mg/dL — AB (ref 125–200)
HDL: 37 mg/dL — AB (ref >=40)
LDL Cholesterol: 49 mg/dL (ref <130)
Triglycerides: 164 mg/dL — ABNORMAL HIGH (ref <150)
VLDL: 33 mg/dL — ABNORMAL HIGH (ref <30)

## 2015-10-10 LAB — TESTOSTERONE: Testosterone: 322 ng/dL (ref 250–827)

## 2015-10-10 NOTE — Progress Notes (Signed)
Subjective:   HPI  [name] is a 70 y.o. male who presents for a complete physical.He underwent a fusion procedure of his low back in September. He is still having difficulty with pain from this mainly down the left side. He is now getting massage and has had dry needle therapy or any continues with stretching. He continues on diclofenac for that. He is considering going to yoga. He does complain of decreased energy as well as some strength. He is also had ED issues and states that the previous ED meds have not been successful. He sees Dr. Donnie Aho in follow-up for his underlying heart disease. He had an MI in 1995. He continues to see Evette Cristal in counseling to help deal with his underlying anxiety and depression. He and his wife are still taking care of their 57 year old daughter who apparently has chronic fatigue. He expresses station over this but has yet to do anything about it. He does have underlying allergies as well as asthma. He rarely uses his asthma medication. He does complain of foot pain and has started using an orthotic. He does have underlying ADD and does use Concerta on a regular basis.He rarely uses Xanax. He still occasionally will have difficulty with vertigo and does use meclizine fairly regularly for that. His reflux seems to be under good control. He would like to be HIV tested although his motion is quite minimal.  Medical care team includes:  [facility] pulm. As needed  tilley card.  kritzer neruo. [name]     [facility] Preventative care: Last ophthalmology visit:gro[facility] 8/16 Last dental visit:10/08/14 Last colonoscopy:10/20/13 [facility]  Last prostate exam: N/A Last EKG:02/20/15 Last labs:06/30/14  Prior vaccinations: TD or Tdap:10/20/06 Influenza:04/21/15 Pneumococcal: 23: 06/10/05 05/19/12 13: 03/04/14 Shingles/Zostavax:01/01/07  Advanced directive:Yes Health care power of attorney:Yes Living will: Yes   Reviewed their medical, surgical, family, social,  medication, and allergy history and updated chart as appropriate.    Review of Systems Constitutional: -fever, -chills, -sweats, -unexpected weight change, -decreased appetite, -fatigue Allergy: -sneezing, -itching, -congestion Dermatology: -changing moles, --rash, -lumps ENT: -runny nose, -ear pain, -sore throat, -hoarseness, -sinus pain, -teeth pain, - ringing in ears, -hearing loss, -nosebleeds Cardiology: -chest pain, -palpitations, -swelling, -difficulty breathing when lying flat, -waking up short of breath Respiratory: -cough, -shortness of breath, -difficulty breathing with exercise or exertion, -wheezing, -coughing up blood Gastroenterology: -abdominal pain, -nausea, -vomiting, -diarrhea, -constipation, -blood in stool, -changes in bowel movement, -difficulty swallowing or eating Hematology: -bleeding, -bruising  Musculoskeletal: -joint aches, -muscle aches, -joint swelling, --neck pain, -cramping, -changes in gait Ophthalmology: denies vision changes, eye redness, itching, discharge Urology: -burning with urination, -difficulty urinating, -blood in urine, -urinary frequency, -urgency, -incontinence Neurology: -headache, -weakness, -tingling, -numbness, -memory loss, -falls, -dizziness Psychology:-agitation, -sleep problems     Objective:   Physical Exam  General appearance: alert, no distress, WD/WN,  Skin:Normal HEENT: normocephalic, conjunctiva/corneas normal, sclerae anicteric, PERRLA, EOMi, nares patent, no discharge or erythema, pharynx normal Oral cavity: MMM, tongue normal, teeth normal Neck: supple, no lymphadenopathy, no thyromegaly, no masses, normal ROM Chest: non tender, normal shape and expansion Heart: RRR, normal S1, S2, no murmurs Lungs: CTA bilaterally, no wheezes, rhonchi, or rales Abdomen: +bs, soft, non tender, non distended, no masses, no hepatomegaly, no splenomegaly, no bruits Back: non tender, normal ROM, no scoliosis Musculoskeletal: upper  extremities non tender, no obvious deformity, normal ROM throughout, lower extremities non tender, no obvious deformity, normal ROM throughout Extremities: no edema, no cyanosis, no clubbing Pulses: 2+ symmetric, upper  and lower extremities, normal cap refill Neurological: alert, oriented x 3, CN2-12 intact, strength normal upper extremities and lower extremities, sensation normal throughout, DTRs 2+ throughout, no cerebellar signs, gait normal Psychiatric: normal affect, behavior normal, pleasant    Assessment and Plan :   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  Benign paroxysmal positional vertigo, unspecified laterality  Old inferior wall myocardial infarction - Plan: CBC with Differential/Platelet, Comprehensive metabolic panel, Lipid panel  Gastroesophageal reflux disease without esophagitis  Depression with anxiety  Hyperlipidemia - Plan: Lipid panel  Lumbar disc disease  Essential hypertension - Plan: CBC with Differential/Platelet, Comprehensive metabolic panel  Asthma, chronic, mild intermittent, uncomplicated  Non-seasonal allergic rhinitis due to pollen  Chronic fatigue - Plan: CBC with Differential/Platelet, Comprehensive metabolic panel, Testosterone  Contact with or exposure to communicable disease - Plan: HIV antibody  Need for hepatitis C screening test - Plan: Hepatitis C antibody  Arthritis He is to continue on medications listed in the chart. They were reviewed with him. Did recommend he use the orthotics for his feet. Again strongly encouraged him to make changes necessary to pull himself and his wife away from the daughter. Encouraged him to continue with physical therapy. Follow-up on the fatigue issue when I get the blood work back. At the end of the encounter he then mentioned difficulty with soiling. I then recommended using a bulk laxative and if further difficulty, call Dr. Matthias HughsBuccini.   Physical exam - discussed healthy lifestyle, diet,  exercise, preventative care, vaccinations, and addressed their concerns.    Follow-up As needed

## 2015-10-10 NOTE — Patient Instructions (Addendum)
  [name] , Thank you for taking time to come for your Medicare Wellness Visit. I appreciate your ongoing commitment to your health goals. Please review the following plan we discussed and let me know if I can assist you in the future.   These are the goals we discussed:  2 Your present medications and consider what we talked about in regard to your daughter. This is a list of the screening recommended for you and due dates:  Health Maintenance  Topic Date Due  .  Hepatitis C: One time screening is recommended by Center for Disease Control  (CDC) for  adults born from [birth year] through 1965.   31-Mar-1946  . Flu Shot  01/09/2016  . Tetanus Vaccine  10/19/2016  . Colon Cancer Screening  10/21/2023  . Shingles Vaccine  Completed  . Pneumonia vaccines  Completed

## 2015-10-11 LAB — HEPATITIS C ANTIBODY: HCV Ab: NEGATIVE

## 2015-10-11 LAB — HIV ANTIBODY (ROUTINE TESTING W REFLEX): HIV 1&2 Ab, 4th Generation: NONREACTIVE

## 2015-10-23 ENCOUNTER — Telehealth: Payer: Self-pay | Admitting: Family Medicine

## 2015-10-23 NOTE — Telephone Encounter (Signed)
Called pt and gave him Dr Jola BabinskiLalonde's instructions

## 2015-10-23 NOTE — Telephone Encounter (Signed)
Pt is still having issues with his foot. He siad he mentioned this at his last visit. He has foot pain and sometimes the foot feels hot. Does he need to see a podiatrist or come back to our office for an appt? Call pt at [phone]

## 2015-10-23 NOTE — Telephone Encounter (Signed)
Have him follow-up with whoever made his orthotic

## 2015-11-01 ENCOUNTER — Telehealth: Payer: Self-pay | Admitting: Internal Medicine

## 2015-11-01 NOTE — Telephone Encounter (Signed)
Pt states he is still having pain in his feet and would like to go to [facility] but states he needs a referral. (he states he mentioned this to you at his cpe)

## 2015-11-03 ENCOUNTER — Telehealth: Payer: Self-pay | Admitting: Internal Medicine

## 2015-11-03 MED ORDER — ALBUTEROL SULFATE HFA 108 (90 BASE) MCG/ACT IN AERS: 2.0000 | INHALATION_SPRAY | RESPIRATORY_TRACT | Status: DC | PRN

## 2015-11-03 MED ORDER — PREDNISONE 20 MG PO TABS: 20.0000 mg | ORAL_TABLET | Freq: Every day | ORAL | Status: DC

## 2015-11-03 NOTE — Telephone Encounter (Signed)
Patient returning our call-prm  °

## 2015-11-03 NOTE — Telephone Encounter (Signed)
Ok to refill his albuterol HFA  # 1,  Inhale 2 puffs every 4-6 hours if needed, ref x 12  Doubt antibiotic would help with no fever, purulent sputum, etc  If he needs, we can send prednisone 20 mg, # 4, 1 daily

## 2015-11-03 NOTE — Telephone Encounter (Signed)
Spoke with pt. He is aware of CY's recommendations. Rxs have been sent in. Nothing further was needed. 

## 2015-11-03 NOTE — Telephone Encounter (Signed)
Attempted to call pt's home number, no answer. lmtcb x1 on pt's cell phone.

## 2015-11-03 NOTE — Telephone Encounter (Signed)
Spoke with pt. Reports SOB, chest tightness and wheezing. Denies coughing. States that his symptoms started last night. He would like to have a refill on his albuterol HFA. Is wondering if he needs an antibiotic.  Allergies  Allergen Reactions  . Oxycodone Nausea And Vomiting   Current Outpatient Prescriptions on File Prior to Visit  Medication Sig Dispense Refill  . albuterol (VENTOLIN HFA) 108 (90 BASE) MCG/ACT inhaler Inhale 2 puffs into the lungs every 4 (four) hours as needed. (Patient not taking: Reported on 10/10/2015) 1 Inhaler 3  . ALPRAZolam (XANAX) 0.25 MG tablet TAKE 1 TABLET BY MOUTH 3 TIMES A DAY AS NEEDED FOR ANXIETY 30 tablet 1  . atorvastatin (LIPITOR) 80 MG tablet Take 80 mg by mouth daily.     . budesonide-formoterol (SYMBICORT) 160-4.5 MCG/ACT inhaler Inhale 2 puffs into the lungs 2 (two) times daily. (Patient not taking: Reported on 10/10/2015) 1 Inhaler prn  . buPROPion (WELLBUTRIN XL) 300 MG 24 hr tablet Take 1 tablet by mouth  daily 90 tablet 1  . calcium carbonate (OS-CAL) 600 MG TABS tablet Take 600 mg by mouth daily with breakfast.    . cetirizine (ZYRTEC) 10 MG tablet Take 10 mg by mouth daily.      . cholecalciferol (VITAMIN D) 1000 UNITS tablet Take 1,000 Units by mouth daily.    [name]. desvenlafaxine (PRISTIQ) 50 MG 24 hr tablet Take 1 tablet by mouth  daily 90 tablet 0  . diltiazem (CARDIZEM CD) 240 MG 24 hr capsule Take 240 mg by mouth daily.      [name]. GLUCOSAMINE CHONDROITIN COMPLX PO Take 1 tablet by mouth daily.    [name]. guaiFENesin (MUCINEX) 600 MG 12 hr tablet Take 1,200 mg by mouth daily as needed.     . meclizine (ANTIVERT) 25 MG tablet Take 1 tablet by mouth  twice a day for dizziness 180 tablet 0  . methylphenidate (CONCERTA) 36 MG PO CR tablet Take 1 tablet (36 mg total) by mouth daily. 30 tablet 0  . methylphenidate (CONCERTA) 36 MG PO CR tablet Take 1 tablet (36 mg total) by mouth daily. 30 tablet 0  . [START ON 11/14/2015] methylphenidate (CONCERTA) 54 MG PO CR  tablet Take 1 tablet (54 mg total) by mouth every morning. 30 tablet 0  . montelukast (SINGULAIR) 10 MG tablet Take 1 tablet by mouth at  bedtime 90 tablet 1  . Multiple Vitamins-Minerals (MULTIVITAMIN WITH MINERALS) tablet Take 1 tablet by mouth daily.    . nitroGLYCERIN (NITROSTAT) 0.4 MG SL tablet Place 0.4 mg under the tongue every 5 (five) minutes as needed.     . pantoprazole (PROTONIX) 40 MG tablet Take 40 mg by mouth daily.     No current facility-administered medications on file prior to visit.    CY - please advise. Thanks.

## 2015-11-03 NOTE — Telephone Encounter (Signed)
Pt calling about appointment for today w/CY can be reached @ [phone].Caren GriffinsStanley A Dalton

## 2015-11-07 ENCOUNTER — Ambulatory Visit
Admission: RE | Admit: 2015-11-07 | Discharge: 2015-11-07 | Disposition: A | Discharge status: Home / Self Care | Payer: Medicare Other | Source: Ambulatory Visit | Attending: Family Medicine | Admitting: Family Medicine

## 2015-11-07 ENCOUNTER — Ambulatory Visit (INDEPENDENT_AMBULATORY_CARE_PROVIDER_SITE_OTHER): Payer: Medicare Other | Admitting: Family Medicine

## 2015-11-07 ENCOUNTER — Other Ambulatory Visit: Payer: Self-pay

## 2015-11-07 ENCOUNTER — Encounter: Payer: Self-pay | Admitting: Family Medicine

## 2015-11-07 VITALS — BP 112/60 | HR 84 | Ht 67.0 in | Wt 171.0 lb

## 2015-11-07 DIAGNOSIS — M25579 Pain in unspecified ankle and joints of unspecified foot: Secondary | ICD-10-CM

## 2015-11-07 DIAGNOSIS — M519 Unspecified thoracic, thoracolumbar and lumbosacral intervertebral disc disorder: Secondary | ICD-10-CM

## 2015-11-07 DIAGNOSIS — F909 Attention-deficit hyperactivity disorder, unspecified type: Secondary | ICD-10-CM

## 2015-11-07 DIAGNOSIS — F900 Attention-deficit hyperactivity disorder, predominantly inattentive type: Secondary | ICD-10-CM

## 2015-11-07 NOTE — Progress Notes (Signed)
   Subjective:    Patient ID: [name], male    DOB: September 06, 1945, 70 y.o.   MRN: [id number]  HPI He is here for consult concerning continued difficulty with bilateral foot pain. He states that this started approximately 2 years ago and his chiropractor gave him orthotics which apparently did help. He then stopped using them as he got tired of wearing them. In the last several weeks he has experienced increase foot discomfort and also describes a hot and tingling sensation. It was very difficult for him to stay on task and give me a good history however. He states that he did take his ADD medication this morning. He is involved in physical therapy to help with his back and hip. He states that he is feeling somewhat better with the physical therapy He said that the therapist did apparently adjust his orthotic recently and is not sure if this had much of an effect. He also has a previous history of right foot surgery done by Dr. Lestine BoxBednarz   Review of Systems     Objective:   Physical Exam Alert and in no distress. Surgical scar noted over the first MTP on the right. Full motion of the toes foot and ankles laterally. Normal sensation.       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 My concern here is that he is having a biomechanics issue and my concern is his back is contributing to his foot issues.I will refer [facility] to [facility] for more thorough evaluation of his back and foot issues and to also look at his orthotics.

## 2015-11-08 NOTE — Addendum Note (Signed)
Addended by: Herminio CommonsJOHNSON, Lessly Stigler A on: 11/08/2015 09:05 AM   Modules accepted: Orders

## 2015-11-09 ENCOUNTER — Telehealth: Payer: Self-pay | Admitting: Internal Medicine

## 2015-11-09 MED ORDER — BUDESONIDE-FORMOTEROL FUMARATE 160-4.5 MCG/ACT IN AERO: 2.0000 | INHALATION_SPRAY | Freq: Two times a day (BID) | RESPIRATORY_TRACT | Status: DC

## 2015-11-09 NOTE — Telephone Encounter (Signed)
Spoke with pt. He needs a refill on Symbicort. Rx has been sent in. Nothing further was needed. 

## 2015-11-14 ENCOUNTER — Ambulatory Visit (INDEPENDENT_AMBULATORY_CARE_PROVIDER_SITE_OTHER): Payer: Medicare Other | Admitting: Sports Medicine

## 2015-11-14 ENCOUNTER — Encounter: Payer: Self-pay | Admitting: Sports Medicine

## 2015-11-14 VITALS — BP 138/74 | Ht 67.0 in | Wt 167.0 lb

## 2015-11-14 DIAGNOSIS — M25579 Pain in unspecified ankle and joints of unspecified foot: Secondary | ICD-10-CM | POA: Diagnosis not present

## 2015-11-14 MED ORDER — GABAPENTIN 300 MG PO CAPS: 300.0000 mg | ORAL_CAPSULE | Freq: Every day | ORAL | Status: DC

## 2015-11-14 NOTE — Progress Notes (Signed)
Patient ID: Dwayne PandyMitchell [name], male   DOB: [birth year], 70 y.o.   MRN: [id number]  CC: Bilateral foot pain  HPI: [name] is a 70 yo male with PMH of severe spinal OA, HTN, CAD w/ distant hx of prior MI presenting with several months of bilateral foot pain.  States he is in the process of recovering from L2-L4 fusion surgery in September 2016, only in the last several months has he been able to walk regularly and get back to his normal activities and participate in PT.  As he's ramped up activities he's noticed more pain in his feet.  States the pain is mostly on the top of each foot, comes and goes, worse at night.  Feels "hot" and "fluttery" (rather than pins/needles) and itches.  Saw his primary doctor about this and informed his physical therapist, was at one point fitted with orthotics and a R-sided heal lift (for malaligned back) which have helped somewhat.  One day, he wore his orthotics in really tight shoes and this caused horrible pain, now has comfortable shoes.  Has not tried any pain medication or foot braces for his pain.  History of R great toe surgery (arthroscopy of MTP) and has longstanding pain in L MTP.    ROS:  Gen: - fever/chills MSK: + low back, L hip, bilateral thumb pain, rest per HPI Neuro: steady gait - for falls          PE:  BP 138/74 mmHg  Ht 5\' 7"  (1.702 m)  Wt 167 lb (75.751 kg)  BMI 26.15 kg/m2 Gen: 70 yo male sitting on exam table in NAD HEENT: normocephalic, atraumatic Pulm: normal work of breathing MSK:  R foot: on inspection has hallux valgus with shortening and semi-rigid extension of great toe as well as Haglund's deformity, non-TTP over ligamentous, articular, and bony landmarks of foot, normal active ROM, - anterior drawer sign and talar tilt, strength 5/5 on knee extension/flexion, ankle dorsiflexion/plantarflexion, gross sensation intact L foot: inspection of bony and articular landmarks reveals no abnormalities, non-TTP over ligamentous,  articular, and bony landmarks of foot, normal active ROM, - anterior drawer sign and talar tilt, strength 5/5 on knee extension/flexion, ankle dorsiflexion/plantarflexion, gross sensation intact Back: at rest L hip and lower back jut out from center, has resultant R-sided upper body lean Neuro: steady gait however leaned to R side  X-rays of both feet are reviewed. Left foot x-ray is fairly unremarkable. Right foot x-ray shows moderately advanced first MTP degenerative changes. Mild hallux valgus as well.  A/P:  [name] is a 70 yo male with PMH per above presenting with likely neuropathic pain of bilateral feet.    Neuropathic foot pain Most likely source of pain given pt's characterization and normal X-ray findings save for old arthritic processes.  Etiology unclear, could be related to his chronic back pain, tarsal tunnel syndrome, unlikely diabetes given A1C 5.4 in 2013 and dorsum of foot is involved. -- Will prescribe gabapentin 300mg  qhs for pain -- Trial of this to see if pain improves, if it does supports diagnosis of neuropathic pain and can adjust dose from there -- Encouraged to continue PT and using orthotics/heel lift  -- F/u 3-4 weeks to evaluate pain  Francie Massingominick DeFelice, MS4  Patient seen and evaluated with the medical student. I agree with the above plan of care. Patient's history suggests neuropathic pain although the etiology is unclear. He also has first MTP osteoarthritis in his right foot but his history and clinical  exam findings do not suggest this to be the source of pain. We will try him on a low dose of gabapentin at night and see him back in 3-4 weeks. His current orthotics are comfortable and I do not think they need any further adjusting.

## 2015-12-09 ENCOUNTER — Other Ambulatory Visit: Payer: Self-pay | Admitting: Family Medicine

## 2015-12-11 NOTE — Telephone Encounter (Signed)
Is this okay to refill? 

## 2015-12-13 ENCOUNTER — Other Ambulatory Visit: Payer: Self-pay

## 2015-12-13 ENCOUNTER — Encounter: Payer: Self-pay | Admitting: Sports Medicine

## 2015-12-13 ENCOUNTER — Ambulatory Visit (INDEPENDENT_AMBULATORY_CARE_PROVIDER_SITE_OTHER): Payer: Medicare Other | Admitting: Sports Medicine

## 2015-12-13 VITALS — BP 114/95 | HR 85 | Ht 67.0 in | Wt 167.0 lb

## 2015-12-13 DIAGNOSIS — M25579 Pain in unspecified ankle and joints of unspecified foot: Secondary | ICD-10-CM

## 2015-12-13 NOTE — Progress Notes (Signed)
   Subjective:    Patient ID: [name], male    DOB: 1946-03-29, 70 y.o.   MRN: [id number]  HPI   Patient comes in today for follow-up on bilateral foot pain. He has noticed improvement with 300 mg of gabapentin at night. He still gets occasional burning pain in both feet but it is less noticeable than on his previous visit.    Review of Systems     Objective:   Physical Exam  Well-developed, well-nourished. No acute distress  Examination of each foot is basically unchanged when compared to the exam on 11/14/2015. No obvious soft tissue swelling. There is a negative Tinel's at the tarsal tunnel.       Assessment & Plan:   Improved bilateral foot pain, likely neuropathic in nature  Although I do not have a definitive reason for the patient's neuropathic foot pain, his positive response to gabapentin suggests that his pain is in fact nerve related. He is not diabetic. He does have a history of spinal surgery. I explained to him that a definitive diagnosis is not necessarily needed as long as he is feeling better. We did discuss the possibility of EMG/nerve conduction studies or referral to neurology down the road if the patient desires. We also discussed the possibility of increasing his gabapentin at a later date if needed. For now, we will keep him at 300 mg daily at bedtime. He is safe to take this dose chronically as needed. Follow-up with me as needed.

## 2016-01-09 ENCOUNTER — Telehealth: Payer: Self-pay | Admitting: Family Medicine

## 2016-01-09 ENCOUNTER — Telehealth: Payer: Self-pay

## 2016-01-09 MED ORDER — METHYLPHENIDATE HCL ER (OSM) 36 MG PO TBCR: 36.0000 mg | EXTENDED_RELEASE_TABLET | Freq: Every day | ORAL | 0 refills | Status: DC

## 2016-01-09 MED ORDER — METHYLPHENIDATE HCL ER (OSM) 54 MG PO TBCR: 54.0000 mg | EXTENDED_RELEASE_TABLET | ORAL | 0 refills | Status: DC

## 2016-01-09 NOTE — Telephone Encounter (Signed)
Pt called and is needing a refill on his concerta, pt can be reached at [phone] (M) when ready to be picked up

## 2016-01-09 NOTE — Telephone Encounter (Signed)
Pt notified that Concerta scripts ready for pick up at front desk. Trixie Rude

## 2016-01-19 ENCOUNTER — Ambulatory Visit (INDEPENDENT_AMBULATORY_CARE_PROVIDER_SITE_OTHER): Payer: Medicare Other | Admitting: Family Medicine

## 2016-01-19 ENCOUNTER — Encounter: Payer: Self-pay | Admitting: Family Medicine

## 2016-01-19 VITALS — BP 122/74 | HR 70 | Wt 172.0 lb

## 2016-01-19 DIAGNOSIS — L989 Disorder of the skin and subcutaneous tissue, unspecified: Secondary | ICD-10-CM

## 2016-01-19 NOTE — Progress Notes (Signed)
   Subjective:    Patient ID: [name], male    DOB: 12/23/1945, 70 y.o.   MRN: [id number]  HPI He complains of a fullness and a feeling of a lump in the right leg near the Achilles tendon.   Review of Systems     Objective:   Physical Exam No palpable lesions noted.       Assessment & Plan:  Leg skin lesion, right I reassured him that I found nothing to be concerned about and if he has further trouble with that, marked the area and return here for further evaluation.

## 2016-03-08 ENCOUNTER — Other Ambulatory Visit: Payer: Self-pay | Admitting: *Deleted

## 2016-03-08 MED ORDER — GABAPENTIN 300 MG PO CAPS: 300.0000 mg | ORAL_CAPSULE | Freq: Every day | ORAL | 3 refills | Status: DC

## 2016-03-19 ENCOUNTER — Other Ambulatory Visit: Payer: Self-pay | Admitting: Family Medicine

## 2016-03-20 NOTE — Telephone Encounter (Signed)
Is this okay to refill? 

## 2016-03-20 NOTE — Telephone Encounter (Signed)
Called in xanax per jcl 

## 2016-03-29 ENCOUNTER — Telehealth: Payer: Self-pay | Admitting: Family Medicine

## 2016-03-29 MED ORDER — MECLIZINE HCL 25 MG PO TABS: ORAL_TABLET | ORAL | 0 refills | Status: DC

## 2016-03-29 NOTE — Telephone Encounter (Signed)
Rcvd refill request for Meclizine from [facility]

## 2016-04-05 ENCOUNTER — Other Ambulatory Visit: Payer: Self-pay | Admitting: Internal Medicine

## 2016-04-12 ENCOUNTER — Other Ambulatory Visit (INDEPENDENT_AMBULATORY_CARE_PROVIDER_SITE_OTHER): Payer: Medicare Other

## 2016-04-12 DIAGNOSIS — Z23 Encounter for immunization: Secondary | ICD-10-CM | POA: Diagnosis not present

## 2016-04-29 ENCOUNTER — Telehealth: Payer: Self-pay

## 2016-04-29 MED ORDER — METHYLPHENIDATE HCL ER (OSM) 54 MG PO TBCR: 54.0000 mg | EXTENDED_RELEASE_TABLET | ORAL | 0 refills | Status: DC

## 2016-04-29 MED ORDER — METHYLPHENIDATE HCL ER (OSM) 36 MG PO TBCR
36.0000 mg | EXTENDED_RELEASE_TABLET | Freq: Every day | ORAL | 0 refills | Status: DC
Start: 2016-05-29 — End: 2016-09-02

## 2016-04-29 MED ORDER — METHYLPHENIDATE HCL ER (OSM) 36 MG PO TBCR: 36.0000 mg | EXTENDED_RELEASE_TABLET | Freq: Every day | ORAL | 0 refills | Status: DC

## 2016-04-29 NOTE — Telephone Encounter (Signed)
Pt need refills on Concerta. Please call when ready [phone]

## 2016-04-30 ENCOUNTER — Telehealth: Payer: Self-pay

## 2016-04-30 NOTE — Telephone Encounter (Signed)
LM that scripts place at front desk for pick up.Trixie Rude/RLB

## 2016-05-01 ENCOUNTER — Other Ambulatory Visit: Payer: Self-pay

## 2016-05-01 MED ORDER — MONTELUKAST SODIUM 10 MG PO TABS: 10.0000 mg | ORAL_TABLET | Freq: Every day | ORAL | 3 refills | Status: DC

## 2016-05-31 ENCOUNTER — Telehealth: Payer: Self-pay | Admitting: *Deleted

## 2016-05-31 NOTE — Telephone Encounter (Signed)
Requesting surgical clearance:   1. Type of surgery: Injection  2. Surgeon:   3. Surgical date: 06/25/16  4. Medications that need to be held: clopidogrel                  Special instructions:

## 2016-06-06 ENCOUNTER — Telehealth: Payer: Self-pay | Admitting: Family Medicine

## 2016-06-06 MED ORDER — METHYLPHENIDATE HCL ER (OSM) 36 MG PO TBCR: 36.0000 mg | EXTENDED_RELEASE_TABLET | Freq: Every day | ORAL | 0 refills | Status: DC

## 2016-06-06 NOTE — Telephone Encounter (Signed)
Pharmacy called as rx was cut off of sheet.  Verified that it was ok to refill.  The only issue I saw was the Jan 2018 dosage was 54 mg instead of 36.  Called pt t/w wife they will bring it back in for a corrected one.  Please issue

## 2016-06-11 NOTE — Telephone Encounter (Signed)
I have never seen the patient;  He apparently had seen Dr. Donnie Ahoilley several years ago. Should contact Dr. Donnie Ahoilley, but typically pt will need to hold plavix for 5 days prior to procedure.

## 2016-07-06 ENCOUNTER — Other Ambulatory Visit: Payer: Self-pay | Admitting: Family Medicine

## 2016-07-08 NOTE — Telephone Encounter (Signed)
Is this okay to refill? 

## 2016-07-12 ENCOUNTER — Telehealth: Payer: Self-pay | Admitting: Family Medicine

## 2016-07-12 ENCOUNTER — Other Ambulatory Visit: Payer: Self-pay | Admitting: Family Medicine

## 2016-07-12 MED ORDER — MECLIZINE HCL 25 MG PO TABS: ORAL_TABLET | ORAL | 0 refills | Status: DC

## 2016-07-12 NOTE — Telephone Encounter (Signed)
Pt needs refill Meclizine that he takes for Vertigo  Also states that he received notice that it will require a P.A. So please send message back to me after you fill medication

## 2016-07-12 NOTE — Telephone Encounter (Signed)
I sent it in 

## 2016-07-24 NOTE — Telephone Encounter (Signed)
Called [facility] [phone] pt ID# [id number] and initiated P.A. For Meclizine for Vertigo H81.10

## 2016-07-25 NOTE — Telephone Encounter (Signed)
I believe this goes to you 

## 2016-07-25 NOTE — Telephone Encounter (Signed)
Recv'd appeal fax stating preferred medication is Compro, Prochlorperazine, Prochlorperazine edisylate, or Prochlorperazine maleate. Pt needs trial of one of these forms of this medication or reason why pt can't be switched to this medication?   Do you want to switch?

## 2016-07-25 NOTE — Telephone Encounter (Signed)
Have him come in for an appointment so we can discuss this in more detail

## 2016-07-25 NOTE — Telephone Encounter (Signed)
Left message for pt to see if he's tried this medication

## 2016-08-06 NOTE — Telephone Encounter (Signed)
Left messages again on cell & home

## 2016-08-07 ENCOUNTER — Other Ambulatory Visit: Payer: Self-pay | Admitting: Family Medicine

## 2016-08-07 NOTE — Telephone Encounter (Signed)
Is this okay to refill? 

## 2016-08-13 NOTE — Telephone Encounter (Signed)
Called pt & he wants to just pay out of pocket for meclizine, says it works well and he doesn't think it's going to be that expensive and wants to stay on it

## 2016-09-02 ENCOUNTER — Telehealth: Payer: Self-pay | Admitting: Family Medicine

## 2016-09-02 MED ORDER — METHYLPHENIDATE HCL ER (OSM) 36 MG PO TBCR: 36.0000 mg | EXTENDED_RELEASE_TABLET | Freq: Every day | ORAL | 0 refills | Status: DC

## 2016-09-02 NOTE — Telephone Encounter (Signed)
Pt called for Concerta 36 mg. refill for 3 months    Please called pt when ready [phone]

## 2016-09-02 NOTE — Telephone Encounter (Signed)
Called pt advised rx has been written for Concerta.

## 2016-09-09 ENCOUNTER — Telehealth: Payer: Self-pay

## 2016-09-09 NOTE — Telephone Encounter (Signed)
Have him bring in the prescriptions so we can look at them

## 2016-09-09 NOTE — Telephone Encounter (Signed)
Pt informed he said he will pick them up and bring them by

## 2016-09-09 NOTE — Telephone Encounter (Signed)
[facility] called to let you know that scripts from 04/26 and 05/26 for Concerta need to be rewritten. They are not able to accept the scripts due to the date written is not on the script, she said that scripts can not be "post dated."  They were able to fill the 03/26 script. Callback # is [phone]. Trixie Rude

## 2016-09-27 ENCOUNTER — Telehealth: Payer: Self-pay

## 2016-09-27 NOTE — Telephone Encounter (Signed)
Pt brought the Concerta scripts back to the office dated 10/03/2016 and 11/02/2016. Pt states pharmacist returned scripts to pt because they can not be post dated as they were written.  Pt needs new scripts for April and May please. I will call him when they are ready.    Thank you, Lurena Joiner

## 2016-10-07 ENCOUNTER — Other Ambulatory Visit: Payer: Self-pay | Admitting: *Deleted

## 2016-10-07 MED ORDER — GABAPENTIN 300 MG PO CAPS: 300.0000 mg | ORAL_CAPSULE | Freq: Every day | ORAL | 3 refills | Status: DC

## 2016-10-11 ENCOUNTER — Telehealth: Payer: Self-pay | Admitting: Family Medicine

## 2016-10-11 NOTE — Telephone Encounter (Signed)
pts wife dropped off form to be filled out for surgical clearance please fax when signed pt does have a medicare well visit on  May the 8th if you need to see him before you fill it out pt can be reached at [phone] put in your folder

## 2016-10-15 ENCOUNTER — Ambulatory Visit (INDEPENDENT_AMBULATORY_CARE_PROVIDER_SITE_OTHER): Payer: Medicare Other | Admitting: Family Medicine

## 2016-10-15 ENCOUNTER — Ambulatory Visit: Payer: Self-pay | Admitting: Physician Assistant

## 2016-10-15 ENCOUNTER — Encounter: Payer: Self-pay | Admitting: Family Medicine

## 2016-10-15 VITALS — BP 122/80 | HR 70 | Ht 66.0 in | Wt 170.0 lb

## 2016-10-15 DIAGNOSIS — K629 Disease of anus and rectum, unspecified: Secondary | ICD-10-CM | POA: Diagnosis not present

## 2016-10-15 DIAGNOSIS — Z0000 Encounter for general adult medical examination without abnormal findings: Secondary | ICD-10-CM | POA: Diagnosis not present

## 2016-10-15 DIAGNOSIS — H8110 Benign paroxysmal vertigo, unspecified ear: Secondary | ICD-10-CM

## 2016-10-15 DIAGNOSIS — M4316 Spondylolisthesis, lumbar region: Secondary | ICD-10-CM

## 2016-10-15 DIAGNOSIS — F909 Attention-deficit hyperactivity disorder, unspecified type: Secondary | ICD-10-CM

## 2016-10-15 DIAGNOSIS — J4530 Mild persistent asthma, uncomplicated: Secondary | ICD-10-CM | POA: Diagnosis not present

## 2016-10-15 DIAGNOSIS — M1990 Unspecified osteoarthritis, unspecified site: Secondary | ICD-10-CM | POA: Diagnosis not present

## 2016-10-15 DIAGNOSIS — J301 Allergic rhinitis due to pollen: Secondary | ICD-10-CM | POA: Diagnosis not present

## 2016-10-15 DIAGNOSIS — J0110 Acute frontal sinusitis, unspecified: Secondary | ICD-10-CM

## 2016-10-15 DIAGNOSIS — K219 Gastro-esophageal reflux disease without esophagitis: Secondary | ICD-10-CM

## 2016-10-15 DIAGNOSIS — E785 Hyperlipidemia, unspecified: Secondary | ICD-10-CM

## 2016-10-15 DIAGNOSIS — F418 Other specified anxiety disorders: Secondary | ICD-10-CM | POA: Diagnosis not present

## 2016-10-15 DIAGNOSIS — I2510 Atherosclerotic heart disease of native coronary artery without angina pectoris: Secondary | ICD-10-CM

## 2016-10-15 LAB — LIPID PANEL
Cholesterol: 123 mg/dL (ref <200)
HDL: 36 mg/dL — ABNORMAL LOW (ref >40)
LDL CALC: 54 mg/dL (ref <100)
TRIGLYCERIDES: 164 mg/dL — AB (ref <150)
Total CHOL/HDL Ratio: 3.4 Ratio (ref <5.0)
VLDL: 33 mg/dL — AB (ref <30)

## 2016-10-15 LAB — COMPREHENSIVE METABOLIC PANEL
ALBUMIN: 4.5 g/dL (ref 3.6–5.1)
ALT: 16 U/L (ref 9–46)
AST: 18 U/L (ref 10–35)
Alkaline Phosphatase: 82 U/L (ref 40–115)
BUN: 13 mg/dL (ref 7–25)
CALCIUM: 9.8 mg/dL (ref 8.6–10.3)
CHLORIDE: 108 mmol/L (ref 98–110)
CO2: 22 mmol/L (ref 20–31)
CREATININE: 1.07 mg/dL (ref 0.70–1.18)
Glucose, Bld: 105 mg/dL — ABNORMAL HIGH (ref 65–99)
POTASSIUM: 4.6 mmol/L (ref 3.5–5.3)
SODIUM: 141 mmol/L (ref 135–146)
TOTAL PROTEIN: 7.6 g/dL (ref 6.1–8.1)
Total Bilirubin: 0.4 mg/dL (ref 0.2–1.2)

## 2016-10-15 LAB — CBC WITH DIFFERENTIAL/PLATELET
BASOS PCT: 0 %
Basophils Absolute: 0 cells/uL (ref 0–200)
EOS ABS: 216 {cells}/uL (ref 15–500)
EOS PCT: 3 %
HCT: 38 % — ABNORMAL LOW (ref 38.5–50.0)
Hemoglobin: 12.3 g/dL — ABNORMAL LOW (ref 13.2–17.1)
LYMPHS PCT: 23 %
Lymphs Abs: 1656 cells/uL (ref 850–3900)
MCH: 25.4 pg — AB (ref 27.0–33.0)
MCHC: 32.4 g/dL (ref 32.0–36.0)
MCV: 78.5 fL — AB (ref 80.0–100.0)
MONOS PCT: 8 %
MPV: 9.4 fL (ref 7.5–12.5)
Monocytes Absolute: 576 cells/uL (ref 200–950)
Neutro Abs: 4752 cells/uL (ref 1500–7800)
Neutrophils Relative %: 66 %
PLATELETS: 301 10*3/uL (ref 140–400)
RBC: 4.84 MIL/uL (ref 4.20–5.80)
RDW: 18.1 % — AB (ref 11.0–15.0)
WBC: 7.2 10*3/uL (ref 4.0–10.5)

## 2016-10-15 MED ORDER — GABAPENTIN 300 MG PO CAPS: 300.0000 mg | ORAL_CAPSULE | Freq: Every day | ORAL | 3 refills | Status: DC

## 2016-10-15 MED ORDER — PANTOPRAZOLE SODIUM 40 MG PO TBEC: 40.0000 mg | DELAYED_RELEASE_TABLET | Freq: Every day | ORAL | 3 refills | Status: DC

## 2016-10-15 MED ORDER — BUPROPION HCL ER (XL) 300 MG PO TB24: 300.0000 mg | ORAL_TABLET | Freq: Every day | ORAL | 3 refills | Status: DC

## 2016-10-15 MED ORDER — AMOXICILLIN 875 MG PO TABS: 875.0000 mg | ORAL_TABLET | Freq: Two times a day (BID) | ORAL | 0 refills | Status: DC

## 2016-10-15 MED ORDER — BUDESONIDE-FORMOTEROL FUMARATE 160-4.5 MCG/ACT IN AERO: 2.0000 | INHALATION_SPRAY | Freq: Two times a day (BID) | RESPIRATORY_TRACT | 5 refills | Status: DC

## 2016-10-15 MED ORDER — MONTELUKAST SODIUM 10 MG PO TABS: 10.0000 mg | ORAL_TABLET | Freq: Every day | ORAL | 3 refills | Status: DC

## 2016-10-15 MED ORDER — ATORVASTATIN CALCIUM 80 MG PO TABS: 80.0000 mg | ORAL_TABLET | Freq: Every day | ORAL | 3 refills | Status: DC

## 2016-10-15 NOTE — Patient Instructions (Signed)
Do something physical every day for 20 minutes 

## 2016-10-15 NOTE — Progress Notes (Signed)
Subjective:   HPI  [name] is a 71 y.o. male who presents for Chief Complaint  Patient presents with  . Medicare Wellness  . Annual Exam    Medical care team includes: Ronnald Nian, MD here for primary care  Dr.Tilley Cardio. Dr Shon Baton ortho Preventative care: Susann Givens Last ophthalmology visit: 4/18 Last dental visit: 1/18 Last colonoscopy: 03/31/06 Last prostate exam: ? Last EKG:02/20/15 Last labs:10/10/15  Prior vaccinations:  TD or Tdap:10/20/06 Influenza:04/12/16 Pneumococcal:23: 05/19/12,06/10/05 13: 03/04/14 Shingles/Zostavax:01/01/07 Recommend that he get the new Shingrix.  Advanced directive: Yes. Asked for a copy   Concerns: He has a 3 day history of sinus congestion, headache, nausea, dry cough, purulent postnasal drainage but no fever, chills, earache or sore throat. He does have underlying allergies as well as asthma and continues on Singulair, Symbicort, Zyrtec and has a rescue inhaler. He has ADD and continues on his Concerta with good results. Psychologically he seems be doing well on, nature Wellbutrin and Pristiq. He is being followed by orthopedics and has had previous back surgery as well as injections, all of which have failed. He is scheduled for a stimulated to be placed. He also has several lesions in the anal area that he would like me to look at. His reflux is under good control. He has underlying heart disease and does see cardiology yearly. He has had no chest pain, shortness of breath, PND or DOE. Other than his back he does have various arthritic aches and complaints that he seems to have been under good control. He continues have difficulty with ED and has tried Viagra as well as Cialis without good results. He does have underlying reflux disease and is using pantoprazole for that. He continues to do quite nicely on his present psychotropic medications. He intermittently uses meclizine for his dizziness.  Reviewed their medical, surgical, family,  social, medication, and allergy history and updated chart as appropriate.  Past Medical History:  Diagnosis Date  . ADD (attention deficit disorder)   . Allergic rhinitis    skin test 02/23/08 vaccine 2009; allergy shots weekly through Dr. Maple Hudson  . Anemia    takes Fe -   . Anginal pain (HCC)   . Ankylosing spondylitis (HCC)   . Anxiety    sees therapist Vernell Leep  . Arthritis   . Asthma    Dr. Jetty Duhamel  . 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 CAD (coronary artery disease)    Dr. Donnie Aho  . COPD (chronic obstructive pulmonary disease) (HCC)   . Depression   . Depression with anxiety   . Erectile dysfunction   . GERD (gastroesophageal reflux disease)   . History of benign positional vertigo   . Hyperlipidemia   . Hypertension   . Hypogonadism male    prior use on Testim  . Insomnia    prior borderline sleep study  . Kidney stone    pt states no problems showed up on a test once  . Lumbar disc disease   . Mild concentric left ventricular hypertrophy (LVH)   . 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 Pneumonia   . Shortness of breath dyspnea     Past Surgical History:  Procedure Laterality Date  . BRAVO PH STUDY  02/18/2012   Procedure: BRAVO PH STUDY;  Surgeon: Florencia Reasons, MD;  Location: [facility];  Service: Endoscopy;  Laterality: N/A;  . CARDIAC CATHETERIZATION  2006   Dr. Donnie Aho  . CHEILECTOMY  10/16/2011   Procedure: CHEILECTOMY;  Surgeon: Sherri Rad, MD;  Location: [facility];  Service: Orthopedics;  Laterality: Right;  . CHOLECYSTECTOMY    . COLONOSCOPY    . CORONARY ANGIOPLASTY     1995 after MI  . CORONARY ANGIOPLASTY WITH STENT PLACEMENT     1999   . ESOPHAGOGASTRODUODENOSCOPY  02/18/2012   Procedure: ESOPHAGOGASTRODUODENOSCOPY (EGD);  Surgeon: Florencia Reasons, MD;  Location: [facility];  Service: Endoscopy;  Laterality: N/A;  . FOOT SURGERY     joint scraped, arthritis right foot  . LUMBAR DISC  SURGERY     x2  . right index finger  mass removed- arthritis     Dr. Amanda Pea  . TONSILLECTOMY     1953  . UPPER GI ENDOSCOPY  [id number]  . VASECTOMY     1980    Social History   Social History  . Marital status: Married    Spouse name: N/A  . Number of children: Y  . Years of education: N/A   Occupational History  . choir singer   . retired- Chiropractor    Social History Main Topics  . Smoking status: Never Smoker  . Smokeless tobacco: Never Used  . Alcohol use Yes     Comment: drinks a glass of wine once a month   . Drug use: No  . Sexual activity: Not Currently   Other Topics Concern  . Not on file   Social History Narrative   Married, has 2 children, 38yo son, 35yo daughter with fibromyalgia, chronic fatigue syndrome, former Metallurgist.   Artist (paints)    Family History  Problem Relation Age of Onset  . Diabetes Mother   . Dementia Mother   . Heart disease Mother   . Heart disease Father   . Dementia Father   . Chronic fatigue Daughter   . Heart disease Sister     stent ,CAD  . Arthritis Sister   . Cancer Neg Hx      Current Outpatient Prescriptions:  .  albuterol (VENTOLIN HFA) 108 (90 Base) MCG/ACT inhaler, Inhale 2 puffs into the lungs every 4 (four) hours as needed., Disp: 1 Inhaler, Rfl: 2 .  ALPRAZolam (XANAX) 0.25 MG tablet, TAKE 1 TABLET BY MOUTH 3 TIMES A DAY AS NEEDED FOR ANXIETY, Disp: 30 tablet, Rfl: 1 .  atorvastatin (LIPITOR) 80 MG tablet, Take 1 tablet (80 mg total) by mouth daily., Disp: 90 tablet, Rfl: 3 .  budesonide-formoterol (SYMBICORT) 160-4.5 MCG/ACT inhaler, Inhale 2 puffs into the lungs 2 (two) times daily., Disp: 1 Inhaler, Rfl: 5 .  buPROPion (WELLBUTRIN XL) 300 MG 24 hr tablet, Take 1 tablet (300 mg total) by mouth daily., Disp: 90 tablet, Rfl: 3 .  calcium carbonate (OS-CAL) 600 MG TABS tablet, Take 600 mg by mouth daily with breakfast., Disp: , Rfl:  .  cetirizine (ZYRTEC) 10 MG  tablet, Take 10 mg by mouth daily.  , Disp: , Rfl:  .  cholecalciferol (VITAMIN D) 1000 UNITS tablet, Take 1,000 Units by mouth daily., Disp: , Rfl:  .  clopidogrel (PLAVIX) 75 MG tablet, , Disp: , Rfl:  .  desvenlafaxine (PRISTIQ) 50 MG 24 hr tablet, TAKE 1 TABLET BY MOUTH  DAILY, Disp: 90 tablet, Rfl: 1 .  diclofenac (VOLTAREN) 75 MG EC tablet, Take 75 mg by mouth 2 (two) times daily., Disp: , Rfl: 2 .  diltiazem (CARDIZEM CD) 240 MG 24 hr capsule, Take 240 mg  by mouth daily.  , Disp: , Rfl:  .  gabapentin (NEURONTIN) 300 MG capsule, Take 1 capsule (300 mg total) by mouth at bedtime., Disp: 90 capsule, Rfl: 3 .  GLUCOSAMINE CHONDROITIN COMPLX PO, Take 1 tablet by mouth daily., Disp: , Rfl:  .  guaiFENesin (MUCINEX) 600 MG 12 hr tablet, Take 1,200 mg by mouth daily as needed. , Disp: , Rfl:  .  meclizine (ANTIVERT) 25 MG tablet, Take 1 tablet by mouth  twice a day as needed for dizziness, Disp: 180 tablet, Rfl: 0 .  methylphenidate (CONCERTA) 36 MG PO CR tablet, Take 1 tablet (36 mg total) by mouth daily., Disp: 30 tablet, Rfl: 0 .  methylphenidate (CONCERTA) 36 MG PO CR tablet, Take 1 tablet (36 mg total) by mouth daily., Disp: 30 tablet, Rfl: 0 .  [START ON 11/02/2016] methylphenidate (CONCERTA) 36 MG PO CR tablet, Take 1 tablet (36 mg total) by mouth daily., Disp: 30 tablet, Rfl: 0 .  montelukast (SINGULAIR) 10 MG tablet, Take 1 tablet (10 mg total) by mouth at bedtime., Disp: 90 tablet, Rfl: 3 .  Multiple Vitamins-Minerals (MULTIVITAMIN WITH MINERALS) tablet, Take 1 tablet by mouth daily., Disp: , Rfl:  .  nabumetone (RELAFEN) 500 MG tablet, , Disp: , Rfl:  .  nitroGLYCERIN (NITROSTAT) 0.4 MG SL tablet, Place 0.4 mg under the tongue every 5 (five) minutes as needed. , Disp: , Rfl:  .  pantoprazole (PROTONIX) 40 MG tablet, Take 1 tablet (40 mg total) by mouth daily., Disp: 90 tablet, Rfl: 3 .  amoxicillin (AMOXIL) 875 MG tablet, Take 1 tablet (875 mg total) by mouth 2 (two) times daily., Disp:  20 tablet, Rfl: 0  Allergies  Allergen Reactions  . Oxycodone Nausea And Vomiting    Review of Systems Negative except as above    Objective:  General appearance: alert, no distress, WD/WN, Caucasian male Skin: Normal HEENT: normocephalic, conjunctiva/corneas normal, sclerae anicteric, PERRLA, EOMi, nares patent, no discharge or erythema, pharynx normal Oral cavity: MMM, tongue normal, teeth normal Neck: supple, no lymphadenopathy, no thyromegaly, no masses, normal ROM, no bruits Chest: non tender, normal shape and expansion Heart: RRR, normal S1, S2, no murmurs Lungs: CTA bilaterally, no wheezes, rhonchi, or rales Abdomen: +bs, soft, non tender, non distended, no masses, no hepatomegaly, no splenomegaly, no bruits Back: non tender, normal ROM, no scoliosis Musculoskeletal: upper extremities non tender, no obvious deformity, normal ROM throughout, lower extremities non tender, no obvious deformity, normal ROM throughout Extremities: no edema, no cyanosis, no clubbing Pulses: 2+ symmetric, upper and lower extremities, normal cap refill Neurological: alert, oriented x 3, CN2-12 intact, strength normal upper extremities and lower extremities, sensation normal throughout, DTRs 2+ throughout, no cerebellar signs, gait normal Psychiatric: normal affect, behavior normal, pleasant   Rectal: Shows 2 raised cystic-appearing lesions in the. Anal area.   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  Benign paroxysmal positional vertigo, unspecified laterality  Depression with anxiety - Plan: gabapentin (NEURONTIN) 300 MG capsule, buPROPion (WELLBUTRIN XL) 300 MG 24 hr tablet  Coronary artery disease involving native heart without angina pectoris, unspecified vessel or lesion type - Plan: CBC with  Differential/Platelet, Comprehensive metabolic panel, Lipid panel  Hyperlipidemia, unspecified hyperlipidemia type - Plan: Lipid panel, atorvastatin (LIPITOR) 80 MG tablet  Mild persistent chronic asthma without complication - Plan: montelukast (SINGULAIR) 10  MG tablet, budesonide-formoterol (SYMBICORT) 160-4.5 MCG/ACT inhaler  Non-seasonal allergic rhinitis due to pollen - Plan: montelukast (SINGULAIR) 10 MG tablet  Gastroesophageal reflux disease without esophagitis - Plan: pantoprazole (PROTONIX) 40 MG tablet  Spondylolisthesis of lumbar region  Arthritis  Anal lesion  He is stable on the present medication regimen for the above diagnoses. Did recommend he return here in roughly a month after he has had his procedure for the stimulator to reevaluate the anal lesions. He is to call if not entirely better from taking the antibiotic for the sinus infection. He will continue on his other medications.  Physical exam - discussed and counseled on healthy lifestyle, diet, exercise, preventative care, vaccinations, sick and well care, proper use of emergency dept and after hours care, and addressed his concerns.

## 2016-10-21 ENCOUNTER — Telehealth: Payer: Self-pay | Admitting: Family Medicine

## 2016-10-21 NOTE — Telephone Encounter (Signed)
Have him come in so we can reevaluate this.

## 2016-10-21 NOTE — Telephone Encounter (Signed)
Pt said he is still having headaches, dizziness, and nausea. Should he still be having this at this point?

## 2016-10-21 NOTE — Telephone Encounter (Signed)
Pt has appointment in the morning 

## 2016-10-22 ENCOUNTER — Ambulatory Visit (INDEPENDENT_AMBULATORY_CARE_PROVIDER_SITE_OTHER): Payer: Medicare Other | Admitting: Family Medicine

## 2016-10-22 ENCOUNTER — Encounter: Payer: Self-pay | Admitting: Family Medicine

## 2016-10-22 VITALS — BP 120/82 | HR 82 | Wt 170.0 lb

## 2016-10-22 DIAGNOSIS — J301 Allergic rhinitis due to pollen: Secondary | ICD-10-CM | POA: Diagnosis not present

## 2016-10-22 DIAGNOSIS — J0110 Acute frontal sinusitis, unspecified: Secondary | ICD-10-CM

## 2016-10-22 MED ORDER — PREDNISONE 10 MG (48) PO TBPK: ORAL_TABLET | ORAL | 0 refills | Status: DC

## 2016-10-22 MED ORDER — AMOXICILLIN-POT CLAVULANATE 875-125 MG PO TABS: 1.0000 | ORAL_TABLET | Freq: Two times a day (BID) | ORAL | 0 refills | Status: DC

## 2016-10-22 NOTE — Progress Notes (Signed)
Subjective:     Patient ID: [name], male   DOB: 05/25/1946, 71 y.o.   MRN: [id number]  HPI [name] is a 71 year old male with a past medical history of allergies and benign positional vertigo who presents today with a 10 day history of headache, head pressure, dizziness, and nausea. He also has had rhinorrhea, post nasal drip, scratchy throat, and itchy watery eyes. He has no ear pain, fevers, chills, or vomiting. He was seen 7 days ago for a sinus infection and was given amoxicillin. He has 3 days remaining of the antibiotic. He says the antibiotic has helped, but he is only 25% better. He has also tried mucinex and a nasal saline rinse which has helped his symptoms slightly. He currently takes zyrtec for his allergies, but he isn't sure how often he is taking it and doesn't feel like this is working as well as it used to.He does not smoke He reports a history of benign positional vertigo, but feels his current dizziness is different than his episodes of vertigo and is more related to the headache and head pressure he's experiencing.  He has had a few episodes of diarrhea that started after taking the antibiotic for a few days.  Review of Systems Pertinent positives and negatives included in the subjective above    Objective:   Physical Exam Alert and in no distress. Ears were normal and tympanic membranes were non-erythematous and non-bulging. Tender to palpation over frontal sinuses. Throat was non-erythematous and without exudate Nasal mucosa was normal Lungs were clear to auscultation and without wheezes, rales, or crackles Heart was regular rate and rhythm without murmurs, rubs, or gallops.    Assessment and Plan:     Acute non-recurrent maxillary sinusitis - Plan: amoxicillin-clavulanate (AUGMENTIN) 875-125 MG tablet, predniSONE (STERAPRED UNI-PAK 48 TAB) 10 MG (48) TBPK tablet  Non-seasonal allergic rhinitis due to pollen  1. Acute sinusitis: Patient was only  slightly better after 7 days of amoxicillin and symptoms have been ongoing for 10 days. We will switch from amoxicillin to augmentin because he has not improved and encourage using saline rinses, mucinex, and supportive care. We will also prescribe prednisone as well. 2. Allergies: Patient has had itchy and watery eyes as well as a runny nose. He hasn't had very much improvement from the zyrtec. We recommend switching allergy medications to allegra or claritin and using rhinocort for symptoms as well. We'll also place him on a Sterapred Dosepak which should help both the allergies and sinus infection. Patient was seen in conjunction with Dr. Susann GivensLalonde. [name], MS3

## 2016-10-22 NOTE — Patient Instructions (Signed)
Switch to Allegra or Claritin and also use Rhinocort and let me know when you finishe the antibiotic and the steroid.

## 2016-10-22 NOTE — Progress Notes (Signed)
   Subjective:    Patient ID: [name], male    DOB: 12-26-1945, 71 y.o.   MRN: [id number]  HPI    Review of Systems     Objective:   Physical Exam        Assessment & Plan:  .diag

## 2016-11-05 ENCOUNTER — Ambulatory Visit (INDEPENDENT_AMBULATORY_CARE_PROVIDER_SITE_OTHER): Payer: Medicare Other | Admitting: Family Medicine

## 2016-11-05 ENCOUNTER — Encounter: Payer: Self-pay | Admitting: Family Medicine

## 2016-11-05 VITALS — BP 120/84 | HR 85 | Temp 98.0°F | Wt 170.0 lb

## 2016-11-05 DIAGNOSIS — K629 Disease of anus and rectum, unspecified: Secondary | ICD-10-CM

## 2016-11-05 DIAGNOSIS — J301 Allergic rhinitis due to pollen: Secondary | ICD-10-CM

## 2016-11-05 DIAGNOSIS — J0110 Acute frontal sinusitis, unspecified: Secondary | ICD-10-CM | POA: Diagnosis not present

## 2016-11-05 DIAGNOSIS — J0111 Acute recurrent frontal sinusitis: Secondary | ICD-10-CM | POA: Diagnosis not present

## 2016-11-05 MED ORDER — LEVOFLOXACIN 500 MG PO TABS: 500.0000 mg | ORAL_TABLET | Freq: Every day | ORAL | 0 refills | Status: DC

## 2016-11-05 NOTE — Progress Notes (Signed)
   Subjective:    Patient ID: [name], male    DOB: Oct 23, 1945, 71 y.o.   MRN: [id number]  HPI He is here for recheck. He was seen in May 8 and treated with amoxicillin for a sinus infection. One week later he was then switched to Augmentin as well as a Sterapred Dosepak. He states that he is still having difficulty with a dry cough, PND, sinus pressure but no fever, chills, earache, shortness of breath or diaphoresis. He also has a lesion on his anus that he would like me to reevaluate.   Review of Systems     Objective:   Physical Exam Alert and in no distress. Nasal mucosa is slightly red, tender over all sinuses. Tympanic membranes and canals are normal. Pharyngeal area is normal. Neck is supple without adenopathy or thyromegaly. Cardiac exam shows a regular sinus rhythm without murmurs or gallops. Lungs are clear to auscultation. Anal exam does show a 0.5 cm relatively flat lesion with consistent augmentation.        Assessment & Plan:  Non-seasonal allergic rhinitis due to pollen  Acute non-recurrent frontal sinusitis - Plan: CT MAXILLOFACIAL LTD WO CM, levofloxacin (LEVAQUIN) 500 MG tablet  Perianal lesion I will switch him to a different antibiotic and also get CT scan to be sure. I reassured him that the anal lesion was nothing to be concerned about and we will treat with watchful waiting. If it changes in any shape at all, I will reevaluate and probably removed.

## 2016-11-05 NOTE — Addendum Note (Signed)
Addended by: Lavell IslamHOTON, Samah Lapiana M on: 11/05/2016 03:09 PM   Modules accepted: Orders

## 2016-11-07 ENCOUNTER — Ambulatory Visit
Admission: RE | Admit: 2016-11-07 | Discharge: 2016-11-07 | Disposition: A | Discharge status: Home / Self Care | Payer: Medicare Other | Source: Ambulatory Visit | Attending: Family Medicine | Admitting: Family Medicine

## 2016-11-07 DIAGNOSIS — J0111 Acute recurrent frontal sinusitis: Secondary | ICD-10-CM

## 2016-11-11 ENCOUNTER — Other Ambulatory Visit: Payer: Self-pay | Admitting: Internal Medicine

## 2016-11-13 ENCOUNTER — Ambulatory Visit (HOSPITAL_COMMUNITY)
Admission: RE | Admit: 2016-11-13 | Discharge: 2016-11-13 | Disposition: A | Discharge status: Home / Self Care | Payer: Medicare Other | Source: Ambulatory Visit | Attending: Anesthesiology | Admitting: Anesthesiology

## 2016-11-13 ENCOUNTER — Encounter (HOSPITAL_COMMUNITY)
Admission: RE | Admit: 2016-11-13 | Discharge: 2016-11-13 | Disposition: A | Discharge status: Home / Self Care | Payer: Medicare Other | Source: Ambulatory Visit | Attending: Orthopedic Surgery | Admitting: Orthopedic Surgery

## 2016-11-13 ENCOUNTER — Encounter (HOSPITAL_COMMUNITY): Payer: Self-pay

## 2016-11-13 DIAGNOSIS — Z01818 Encounter for other preprocedural examination: Secondary | ICD-10-CM | POA: Diagnosis present

## 2016-11-13 DIAGNOSIS — Z01812 Encounter for preprocedural laboratory examination: Secondary | ICD-10-CM | POA: Insufficient documentation

## 2016-11-13 DIAGNOSIS — G894 Chronic pain syndrome: Secondary | ICD-10-CM | POA: Diagnosis not present

## 2016-11-13 HISTORY — DX: Personal history of urinary calculi: Z87.442

## 2016-11-13 LAB — CBC
HEMATOCRIT: 36 % — AB (ref 39.0–52.0)
HEMOGLOBIN: 11.3 g/dL — AB (ref 13.0–17.0)
MCH: 25.2 pg — AB (ref 26.0–34.0)
MCHC: 31.4 g/dL (ref 30.0–36.0)
MCV: 80.2 fL (ref 78.0–100.0)
Platelets: 228 10*3/uL (ref 150–400)
RBC: 4.49 MIL/uL (ref 4.22–5.81)
RDW: 19.7 % — ABNORMAL HIGH (ref 11.5–15.5)
WBC: 8.2 10*3/uL (ref 4.0–10.5)

## 2016-11-13 LAB — BASIC METABOLIC PANEL
ANION GAP: 7 (ref 5–15)
BUN: 8 mg/dL (ref 6–20)
CHLORIDE: 108 mmol/L (ref 101–111)
CO2: 24 mmol/L (ref 22–32)
Calcium: 8.9 mg/dL (ref 8.9–10.3)
Creatinine, Ser: 1.03 mg/dL (ref 0.61–1.24)
GFR calc non Af Amer: >60 mL/min (ref >60)
Glucose, Bld: 96 mg/dL (ref 65–99)
POTASSIUM: 3.6 mmol/L (ref 3.5–5.1)
SODIUM: 139 mmol/L (ref 135–145)

## 2016-11-13 LAB — SURGICAL PCR SCREEN
MRSA, PCR: NEGATIVE
Staphylococcus aureus: POSITIVE — AB

## 2016-11-13 NOTE — Progress Notes (Signed)
PCP: Dr. John Lalonde  Cardiologist: Dr. Viann FisSharlot GowdahSpencer Tilley  EKG: pt reports past year with Dr. Donnie Ahoilley (records requested)  Stress test: possibly with Dr. Althia Fortsilley-records requested  ECHO: at Dr. York Spanielilley's -records requested  Cardiac Cath: 1995 and 1999  Chest x-ray:denies past year but has had cough and sinus infection requiring antibiotics-chest x-ray today

## 2016-11-13 NOTE — Pre-Procedure Instructions (Addendum)
Dwayne PandyMitchell Patterson Marzo  11/13/2016      [name], [place] - [address] [name] [id number] Phone: [phone] Fax: [phone]  [facility] [place], [place] - [address] [place] [id number] Phone: [phone] Fax: [phone]  [id number] [place]- , [place] - [address] [place] [address] Phone: [phone] Fax: [phone]  [facility] [place] - [address] [place] [id number] Phone: [phone] Fax: [phone]  [facility] [place], [place] - [id number] [place] [address] [place] [id number] Phone: [phone] Fax: [phone]    Your procedure is scheduled on November 21, 2016.  [facility] at 530 AM.  Call this number if you have problems the morning of surgery:[phone]   Remember:  Do not eat food or drink liquids after midnight.  Take these medicines the morning of surgery with A SIP OF WATER albuterol (ventolin) inhaler, Symbicort inhaler <bring inhaler with you>, alprazolam (xanax)-if needed, bupropion (wellbutrin), cetirizine (zyrtec), desvenlafaxine (pristiq), diltazem (cardizem), gabapentin (neurontin), guaifenesin (mucinex), meclizine (antivert)-if needed, methyphenidate (concerta), montelukast (singulair), pantoprazole (protonix).  Stop Plavix as instructed by your surgeon-(5-7 days before surgery)  7 days prior to surgery STOP taking any Aspirin, Aleve, Naproxen, Ibuprofen, Motrin, Advil, Goody's, BC's, all herbal medications, fish oil, and all vitamins   Do not wear jewelry, make-up or nail polish.  Do not wear lotions, powders, or perfumes, or deoderant.  Men may shave face and neck.  Do not bring valuables to the hospital.  [facility] is not responsible for any belongings or  valuables.  Contacts, dentures or bridgework may not be worn into surgery.  Leave your suitcase in the car.  After surgery it may be brought to your room.  For patients admitted to the hospital, discharge time will be determined by your treatment team.  Patients discharged the day of surgery will not be allowed to drive home.   Special instructions:   [place]- Preparing For Surgery  Before surgery, you can play an important role. Because skin is not sterile, your skin needs to be as free of germs as possible. You can reduce the number of germs on your skin by washing with CHG (chlorahexidine gluconate) Soap before surgery.  CHG is an antiseptic cleaner which kills germs and bonds with the skin to continue killing germs even after washing.  Please do not use if you have an allergy to CHG or antibacterial soaps. If your skin becomes reddened/irritated stop using the CHG.  Do not shave (including legs and underarms) for at least 48 hours prior to first CHG shower. It is OK to shave your face.  Please follow these instructions carefully.   1. Shower the NIGHT BEFORE SURGERY and the MORNING OF SURGERY with CHG.   2. If you chose to wash your hair, wash your hair first as usual with your normal shampoo.  3. After you shampoo, rinse your hair and body thoroughly to remove the shampoo.  4. Use CHG as you would any other liquid soap. You can apply CHG directly to the skin and wash gently with a scrungie or a clean washcloth.   5. Apply the CHG Soap to your body ONLY FROM THE NECK DOWN.  Do not use on open wounds or open sores. Avoid  contact with your eyes, ears, mouth and genitals (private parts). Wash genitals (private parts) with your normal soap.  6. Wash thoroughly, paying special attention to the area where your surgery will be performed.  7. Thoroughly rinse your body with warm water from the neck down.  8. DO NOT shower/wash with your normal soap after using and rinsing off the CHG  Soap.  9. Pat yourself dry with a CLEAN TOWEL.   10. Wear CLEAN PAJAMAS   11. Place CLEAN SHEETS on your bed the night of your first shower and DO NOT SLEEP WITH PETS.    Day of Surgery: Do not apply any deodorants/lotions. Please wear clean clothes to the hospital/surgery center.     Please read over the following fact sheets that you were given. Pain Booklet, Coughing and Deep Breathing and Surgical Site Infection Prevention

## 2016-11-14 NOTE — Progress Notes (Addendum)
Anesthesia Chart Review:  Pt is a 71 year old male scheduled for lumbar spinal cord stimulator insertion on 11/21/2016 with Dwayne Lickahari Brooks, MD  - PCP is Sharlot GowdaJohn Lalonde, MD who has cleared pt for surgery.  - Cardiologist is Dwayne FishSpencer Tilley, MD  PMH includes:  CAD, HTN, hyperlipidemia, anemia, COPD, asthma, ADHD, GERD. Never smoker. BMI 27. S/p PLIF 02/28/15.   Medications include: Albuterol, ASA 81 mg, Lipitor, Symbicort, Plavix, diltiazem, Protonix. Pt to stop plavix 7 days before surgery.   Preoperative labs 11/13/16 reviewed and are acceptable for surgery.  CXR 6/6/618: No active cardiopulmonary disease.  EKG 08/02/16: sinus rhythm. Old inferior infarct.   Echo 02/27/15 (Dr. York Spanielilley's office): 1. Mild concentric LVH with inferior hypokinesis and EF 40-45%. 2. Doppler evidence of grade 1 diastolic dysfunction. 3. Trace mitral and tricuspid regurgitation. 4. Mild pulmonic regurgitation.  Nuclear stress test 02/23/2015 (correspondence 03/06/15 in media tab): -Abnormal lexiscan myoview scan with evidence of a prior inferior infarction with mild perinfarction ischemia -EF 27% with inferior and lateral hypokinesis -recommend obtaining echo to evaluation EF.   If no changes, I anticipate pt can proceed with surgery as scheduled.    Dwayne Mastngela Krisann Mckenna, FNP-BC [facility] Phone: [phone] 11/14/2016 4:36 PM

## 2016-11-20 MED ORDER — CEFAZOLIN SODIUM-DEXTROSE 2-4 GM/100ML-% IV SOLN
2.0000 g | INTRAVENOUS | Status: AC
  Administered 2016-11-21: 2 g via INTRAVENOUS
  Filled 2016-11-20: qty 100

## 2016-11-21 ENCOUNTER — Ambulatory Visit (HOSPITAL_COMMUNITY): Payer: Medicare Other | Admitting: Emergency Medicine

## 2016-11-21 ENCOUNTER — Encounter (HOSPITAL_COMMUNITY): Payer: Self-pay | Admitting: Urology

## 2016-11-21 ENCOUNTER — Ambulatory Visit (HOSPITAL_COMMUNITY): Payer: Medicare Other

## 2016-11-21 ENCOUNTER — Encounter (HOSPITAL_COMMUNITY)
Admission: AD | Disposition: A | Discharge status: Home / Self Care | Payer: Self-pay | Source: Ambulatory Visit | Attending: Orthopedic Surgery

## 2016-11-21 ENCOUNTER — Ambulatory Visit (HOSPITAL_COMMUNITY): Payer: Medicare Other | Admitting: Anesthesiology

## 2016-11-21 ENCOUNTER — Ambulatory Visit (HOSPITAL_COMMUNITY)
Admission: AD | Admit: 2016-11-21 | Discharge: 2016-11-22 | Disposition: A | Discharge status: Home / Self Care | Payer: Medicare Other | Source: Ambulatory Visit | Attending: Orthopedic Surgery | Admitting: Orthopedic Surgery

## 2016-11-21 DIAGNOSIS — F329 Major depressive disorder, single episode, unspecified: Secondary | ICD-10-CM | POA: Insufficient documentation

## 2016-11-21 DIAGNOSIS — K219 Gastro-esophageal reflux disease without esophagitis: Secondary | ICD-10-CM | POA: Diagnosis not present

## 2016-11-21 DIAGNOSIS — I10 Essential (primary) hypertension: Secondary | ICD-10-CM | POA: Insufficient documentation

## 2016-11-21 DIAGNOSIS — I252 Old myocardial infarction: Secondary | ICD-10-CM | POA: Diagnosis not present

## 2016-11-21 DIAGNOSIS — Z7902 Long term (current) use of antithrombotics/antiplatelets: Secondary | ICD-10-CM | POA: Insufficient documentation

## 2016-11-21 DIAGNOSIS — M7540 Impingement syndrome of unspecified shoulder: Secondary | ICD-10-CM | POA: Diagnosis not present

## 2016-11-21 DIAGNOSIS — G8929 Other chronic pain: Secondary | ICD-10-CM | POA: Diagnosis present

## 2016-11-21 DIAGNOSIS — E785 Hyperlipidemia, unspecified: Secondary | ICD-10-CM | POA: Insufficient documentation

## 2016-11-21 DIAGNOSIS — J449 Chronic obstructive pulmonary disease, unspecified: Secondary | ICD-10-CM | POA: Insufficient documentation

## 2016-11-21 DIAGNOSIS — Z79899 Other long term (current) drug therapy: Secondary | ICD-10-CM | POA: Insufficient documentation

## 2016-11-21 DIAGNOSIS — E291 Testicular hypofunction: Secondary | ICD-10-CM | POA: Diagnosis not present

## 2016-11-21 DIAGNOSIS — G4700 Insomnia, unspecified: Secondary | ICD-10-CM | POA: Diagnosis not present

## 2016-11-21 DIAGNOSIS — J45909 Unspecified asthma, uncomplicated: Secondary | ICD-10-CM | POA: Diagnosis not present

## 2016-11-21 DIAGNOSIS — I2510 Atherosclerotic heart disease of native coronary artery without angina pectoris: Secondary | ICD-10-CM | POA: Insufficient documentation

## 2016-11-21 DIAGNOSIS — G894 Chronic pain syndrome: Secondary | ICD-10-CM | POA: Diagnosis present

## 2016-11-21 DIAGNOSIS — M4316 Spondylolisthesis, lumbar region: Secondary | ICD-10-CM | POA: Diagnosis not present

## 2016-11-21 DIAGNOSIS — M4727 Other spondylosis with radiculopathy, lumbosacral region: Secondary | ICD-10-CM | POA: Diagnosis not present

## 2016-11-21 DIAGNOSIS — Z7982 Long term (current) use of aspirin: Secondary | ICD-10-CM | POA: Insufficient documentation

## 2016-11-21 DIAGNOSIS — D649 Anemia, unspecified: Secondary | ICD-10-CM | POA: Insufficient documentation

## 2016-11-21 DIAGNOSIS — F419 Anxiety disorder, unspecified: Secondary | ICD-10-CM | POA: Diagnosis not present

## 2016-11-21 DIAGNOSIS — Z419 Encounter for procedure for purposes other than remedying health state, unspecified: Secondary | ICD-10-CM

## 2016-11-21 DIAGNOSIS — F909 Attention-deficit hyperactivity disorder, unspecified type: Secondary | ICD-10-CM | POA: Insufficient documentation

## 2016-11-21 HISTORY — DX: Other chronic pain: G89.29

## 2016-11-21 HISTORY — PX: SPINAL CORD STIMULATOR INSERTION: SHX5378

## 2016-11-21 SURGERY — INSERTION, SPINAL CORD STIMULATOR, LUMBAR
Anesthesia: General | Site: Spine Thoracic

## 2016-11-21 MED ORDER — ACETAMINOPHEN 325 MG PO TABS
650.0000 mg | ORAL_TABLET | ORAL | Status: DC | PRN
  Administered 2016-11-21: 650 mg via ORAL
  Filled 2016-11-21: qty 2

## 2016-11-21 MED ORDER — DEXAMETHASONE SODIUM PHOSPHATE 4 MG/ML IJ SOLN
4.0000 mg | Freq: Four times a day (QID) | INTRAMUSCULAR | Status: AC
  Administered 2016-11-21: 4 mg via INTRAVENOUS
  Filled 2016-11-21: qty 1

## 2016-11-21 MED ORDER — LACTATED RINGERS IV SOLN: INTRAVENOUS | Status: DC

## 2016-11-21 MED ORDER — NITROGLYCERIN 0.4 MG SL SUBL: 0.4000 mg | SUBLINGUAL_TABLET | SUBLINGUAL | Status: DC | PRN

## 2016-11-21 MED ORDER — METHOCARBAMOL 1000 MG/10ML IJ SOLN
500.0000 mg | Freq: Four times a day (QID) | INTRAMUSCULAR | Status: DC | PRN
  Filled 2016-11-21: qty 5

## 2016-11-21 MED ORDER — DEXAMETHASONE SODIUM PHOSPHATE 10 MG/ML IJ SOLN
INTRAMUSCULAR | Status: AC
  Filled 2016-11-21: qty 1

## 2016-11-21 MED ORDER — FENTANYL CITRATE (PF) 100 MCG/2ML IJ SOLN
INTRAMUSCULAR | Status: AC
  Administered 2016-11-21: 50 ug via INTRAVENOUS
  Filled 2016-11-21: qty 2

## 2016-11-21 MED ORDER — 0.9 % SODIUM CHLORIDE (POUR BTL) OPTIME
TOPICAL | Status: DC | PRN
  Administered 2016-11-21: 1000 mL

## 2016-11-21 MED ORDER — FENTANYL CITRATE (PF) 100 MCG/2ML IJ SOLN
25.0000 ug | INTRAMUSCULAR | Status: DC | PRN
  Administered 2016-11-21 (×2): 50 ug via INTRAVENOUS

## 2016-11-21 MED ORDER — LACTATED RINGERS IV SOLN
INTRAVENOUS | Status: DC | PRN
Start: 2016-11-21 — End: 2016-11-21
  Administered 2016-11-21 (×2): via INTRAVENOUS

## 2016-11-21 MED ORDER — MIDAZOLAM HCL 2 MG/2ML IJ SOLN
INTRAMUSCULAR | Status: AC
  Filled 2016-11-21: qty 2

## 2016-11-21 MED ORDER — GABAPENTIN 300 MG PO CAPS
300.0000 mg | ORAL_CAPSULE | Freq: Two times a day (BID) | ORAL | Status: DC
  Administered 2016-11-21 (×2): 300 mg via ORAL
  Filled 2016-11-21 (×2): qty 1

## 2016-11-21 MED ORDER — LIDOCAINE HCL (CARDIAC) 20 MG/ML IV SOLN
INTRAVENOUS | Status: DC | PRN
  Administered 2016-11-21: 100 mg via INTRAVENOUS

## 2016-11-21 MED ORDER — ONDANSETRON HCL 4 MG/2ML IJ SOLN
4.0000 mg | Freq: Four times a day (QID) | INTRAMUSCULAR | Status: DC | PRN
  Filled 2016-11-21: qty 2

## 2016-11-21 MED ORDER — SODIUM CHLORIDE 0.9 % IV SOLN: 250.0000 mL | INTRAVENOUS | Status: DC

## 2016-11-21 MED ORDER — ONDANSETRON HCL 4 MG/2ML IJ SOLN
INTRAMUSCULAR | Status: AC
  Filled 2016-11-21: qty 2

## 2016-11-21 MED ORDER — PHENOL 1.4 % MT LIQD
1.0000 | OROMUCOSAL | Status: DC | PRN
Start: 2016-11-21 — End: 2016-11-22

## 2016-11-21 MED ORDER — PHENYLEPHRINE 40 MCG/ML (10ML) SYRINGE FOR IV PUSH (FOR BLOOD PRESSURE SUPPORT)
PREFILLED_SYRINGE | INTRAVENOUS | Status: AC
  Filled 2016-11-21: qty 10

## 2016-11-21 MED ORDER — ONDANSETRON HCL 4 MG PO TABS: 4.0000 mg | ORAL_TABLET | Freq: Three times a day (TID) | ORAL | 0 refills | Status: DC | PRN

## 2016-11-21 MED ORDER — MONTELUKAST SODIUM 10 MG PO TABS
10.0000 mg | ORAL_TABLET | Freq: Every day | ORAL | Status: DC
  Administered 2016-11-21: 10 mg via ORAL
  Filled 2016-11-21: qty 1

## 2016-11-21 MED ORDER — SODIUM CHLORIDE 0.9% FLUSH
3.0000 mL | Freq: Two times a day (BID) | INTRAVENOUS | Status: DC
  Administered 2016-11-21: 3 mL via INTRAVENOUS

## 2016-11-21 MED ORDER — FENTANYL CITRATE (PF) 250 MCG/5ML IJ SOLN
INTRAMUSCULAR | Status: AC
  Filled 2016-11-21: qty 5

## 2016-11-21 MED ORDER — ALBUTEROL SULFATE (2.5 MG/3ML) 0.083% IN NEBU: 3.0000 mL | INHALATION_SOLUTION | RESPIRATORY_TRACT | Status: DC | PRN

## 2016-11-21 MED ORDER — METHYLPHENIDATE HCL ER 18 MG PO TB24: 36.0000 mg | ORAL_TABLET | Freq: Every day | ORAL | Status: DC

## 2016-11-21 MED ORDER — HYDROCODONE-ACETAMINOPHEN 10-325 MG PO TABS
1.0000 | ORAL_TABLET | ORAL | Status: DC | PRN
  Administered 2016-11-21 – 2016-11-22 (×3): 2 via ORAL
  Filled 2016-11-21 (×3): qty 2

## 2016-11-21 MED ORDER — THROMBIN 20000 UNITS EX SOLR
CUTANEOUS | Status: AC
  Filled 2016-11-21: qty 20000

## 2016-11-21 MED ORDER — DILTIAZEM HCL ER COATED BEADS 120 MG PO CP24
240.0000 mg | ORAL_CAPSULE | Freq: Every day | ORAL | Status: DC
Start: 2016-11-21 — End: 2016-11-22
  Administered 2016-11-21: 240 mg via ORAL
  Filled 2016-11-21: qty 2

## 2016-11-21 MED ORDER — ALPRAZOLAM 0.25 MG PO TABS: 0.2500 mg | ORAL_TABLET | Freq: Three times a day (TID) | ORAL | Status: DC | PRN

## 2016-11-21 MED ORDER — PROPOFOL 10 MG/ML IV BOLUS
INTRAVENOUS | Status: AC
  Filled 2016-11-21: qty 20

## 2016-11-21 MED ORDER — FENTANYL CITRATE (PF) 100 MCG/2ML IJ SOLN
INTRAMUSCULAR | Status: DC | PRN
  Administered 2016-11-21: 100 ug via INTRAVENOUS
  Administered 2016-11-21: 50 ug via INTRAVENOUS

## 2016-11-21 MED ORDER — POLYETHYLENE GLYCOL 3350 17 G PO PACK: 17.0000 g | PACK | Freq: Every day | ORAL | Status: DC | PRN

## 2016-11-21 MED ORDER — MOMETASONE FURO-FORMOTEROL FUM 200-5 MCG/ACT IN AERO
2.0000 | INHALATION_SPRAY | Freq: Two times a day (BID) | RESPIRATORY_TRACT | Status: DC
  Administered 2016-11-21 – 2016-11-22 (×3): 2 via RESPIRATORY_TRACT
  Filled 2016-11-21: qty 8.8

## 2016-11-21 MED ORDER — DEXAMETHASONE 4 MG PO TABS
4.0000 mg | ORAL_TABLET | Freq: Four times a day (QID) | ORAL | Status: AC
  Administered 2016-11-21 (×2): 4 mg via ORAL
  Filled 2016-11-21 (×2): qty 1

## 2016-11-21 MED ORDER — CEFAZOLIN SODIUM-DEXTROSE 2-4 GM/100ML-% IV SOLN
2.0000 g | Freq: Three times a day (TID) | INTRAVENOUS | Status: AC
  Administered 2016-11-21 (×2): 2 g via INTRAVENOUS
  Filled 2016-11-21 (×2): qty 100

## 2016-11-21 MED ORDER — ONDANSETRON HCL 4 MG PO TABS
4.0000 mg | ORAL_TABLET | Freq: Four times a day (QID) | ORAL | Status: DC | PRN
  Administered 2016-11-21: 4 mg via ORAL
  Filled 2016-11-21: qty 1

## 2016-11-21 MED ORDER — PROPOFOL 10 MG/ML IV BOLUS
INTRAVENOUS | Status: DC | PRN
  Administered 2016-11-21: 160 mg via INTRAVENOUS

## 2016-11-21 MED ORDER — BUPIVACAINE-EPINEPHRINE 0.25% -1:200000 IJ SOLN
INTRAMUSCULAR | Status: DC | PRN
  Administered 2016-11-21: 10 mL

## 2016-11-21 MED ORDER — METHOCARBAMOL 500 MG PO TABS
500.0000 mg | ORAL_TABLET | Freq: Four times a day (QID) | ORAL | Status: DC | PRN
  Administered 2016-11-22: 500 mg via ORAL
  Filled 2016-11-21: qty 1

## 2016-11-21 MED ORDER — HEMOSTATIC AGENTS (NO CHARGE) OPTIME
TOPICAL | Status: DC | PRN
  Administered 2016-11-21: 1 via TOPICAL

## 2016-11-21 MED ORDER — MEPERIDINE HCL 25 MG/ML IJ SOLN: 6.2500 mg | INTRAMUSCULAR | Status: DC | PRN

## 2016-11-21 MED ORDER — VENLAFAXINE HCL ER 75 MG PO CP24
75.0000 mg | ORAL_CAPSULE | Freq: Every day | ORAL | Status: DC
  Administered 2016-11-21 – 2016-11-22 (×2): 75 mg via ORAL
  Filled 2016-11-21 (×2): qty 1

## 2016-11-21 MED ORDER — LIDOCAINE 2% (20 MG/ML) 5 ML SYRINGE
INTRAMUSCULAR | Status: AC
  Filled 2016-11-21: qty 5

## 2016-11-21 MED ORDER — METOCLOPRAMIDE HCL 5 MG/ML IJ SOLN: 10.0000 mg | Freq: Once | INTRAMUSCULAR | Status: DC | PRN

## 2016-11-21 MED ORDER — ONDANSETRON HCL 4 MG/2ML IJ SOLN
INTRAMUSCULAR | Status: DC | PRN
  Administered 2016-11-21: 4 mg via INTRAVENOUS

## 2016-11-21 MED ORDER — BUPROPION HCL ER (XL) 300 MG PO TB24
300.0000 mg | ORAL_TABLET | Freq: Every day | ORAL | Status: DC
  Administered 2016-11-21: 300 mg via ORAL
  Filled 2016-11-21: qty 1

## 2016-11-21 MED ORDER — BUPIVACAINE-EPINEPHRINE (PF) 0.25% -1:200000 IJ SOLN
INTRAMUSCULAR | Status: AC
  Filled 2016-11-21: qty 30

## 2016-11-21 MED ORDER — METHOCARBAMOL 500 MG PO TABS
500.0000 mg | ORAL_TABLET | Freq: Three times a day (TID) | ORAL | 0 refills | Status: DC | PRN
Start: 2016-11-21 — End: 2019-05-10

## 2016-11-21 MED ORDER — SUGAMMADEX SODIUM 200 MG/2ML IV SOLN
INTRAVENOUS | Status: AC
  Filled 2016-11-21: qty 2

## 2016-11-21 MED ORDER — ROCURONIUM BROMIDE 10 MG/ML (PF) SYRINGE
PREFILLED_SYRINGE | INTRAVENOUS | Status: AC
  Filled 2016-11-21: qty 5

## 2016-11-21 MED ORDER — SUGAMMADEX SODIUM 200 MG/2ML IV SOLN
INTRAVENOUS | Status: DC | PRN
  Administered 2016-11-21: 155 mg via INTRAVENOUS

## 2016-11-21 MED ORDER — SODIUM CHLORIDE 0.9% FLUSH: 3.0000 mL | INTRAVENOUS | Status: DC | PRN

## 2016-11-21 MED ORDER — MORPHINE SULFATE (PF) 4 MG/ML IV SOLN
2.0000 mg | INTRAVENOUS | Status: DC | PRN
  Administered 2016-11-21: 2 mg via INTRAVENOUS
  Filled 2016-11-21: qty 1

## 2016-11-21 MED ORDER — DEXAMETHASONE SODIUM PHOSPHATE 10 MG/ML IJ SOLN
INTRAMUSCULAR | Status: DC | PRN
  Administered 2016-11-21: 10 mg via INTRAVENOUS

## 2016-11-21 MED ORDER — PHENYLEPHRINE HCL 10 MG/ML IJ SOLN
INTRAMUSCULAR | Status: DC | PRN
  Administered 2016-11-21: 80 ug via INTRAVENOUS
  Administered 2016-11-21: 40 ug via INTRAVENOUS
  Administered 2016-11-21 (×4): 80 ug via INTRAVENOUS
  Administered 2016-11-21: 40 ug via INTRAVENOUS
  Administered 2016-11-21 (×2): 80 ug via INTRAVENOUS

## 2016-11-21 MED ORDER — MIDAZOLAM HCL 5 MG/5ML IJ SOLN
INTRAMUSCULAR | Status: DC | PRN
  Administered 2016-11-21: 2 mg via INTRAVENOUS

## 2016-11-21 MED ORDER — ACETAMINOPHEN 10 MG/ML IV SOLN
1000.0000 mg | INTRAVENOUS | Status: AC
  Administered 2016-11-21: 1000 mg via INTRAVENOUS
  Filled 2016-11-21: qty 100

## 2016-11-21 MED ORDER — ROCURONIUM BROMIDE 100 MG/10ML IV SOLN
INTRAVENOUS | Status: DC | PRN
  Administered 2016-11-21: 60 mg via INTRAVENOUS

## 2016-11-21 MED ORDER — PANTOPRAZOLE SODIUM 40 MG PO TBEC
40.0000 mg | DELAYED_RELEASE_TABLET | Freq: Every day | ORAL | Status: DC
  Administered 2016-11-21: 40 mg via ORAL
  Filled 2016-11-21: qty 1

## 2016-11-21 MED ORDER — MENTHOL 3 MG MT LOZG: 1.0000 | LOZENGE | OROMUCOSAL | Status: DC | PRN

## 2016-11-21 MED ORDER — ACETAMINOPHEN 650 MG RE SUPP: 650.0000 mg | RECTAL | Status: DC | PRN

## 2016-11-21 MED ORDER — HYDROCODONE-ACETAMINOPHEN 10-325 MG PO TABS: 1.0000 | ORAL_TABLET | Freq: Four times a day (QID) | ORAL | 0 refills | Status: DC | PRN

## 2016-11-21 SURGICAL SUPPLY — 57 items
CANISTER SUCT 3000ML PPV (MISCELLANEOUS) ×2 IMPLANT
CLSR STERI-STRIP ANTIMIC 1/2X4 (GAUZE/BANDAGES/DRESSINGS) ×2 IMPLANT
COVER PROBE W GEL 5X96 (DRAPES) IMPLANT
COVER SURGICAL LIGHT HANDLE (MISCELLANEOUS) ×2 IMPLANT
DRAPE C-ARM 42X72 X-RAY (DRAPES) ×2 IMPLANT
DRAPE INCISE IOBAN 85X60 (DRAPES) ×1 IMPLANT
DRAPE SURG 17X23 STRL (DRAPES) ×2 IMPLANT
DRAPE U-SHAPE 47X51 STRL (DRAPES) ×2 IMPLANT
DRSG AQUACEL AG ADV 3.5X 6 (GAUZE/BANDAGES/DRESSINGS) ×4 IMPLANT
DURAPREP 26ML APPLICATOR (WOUND CARE) ×2 IMPLANT
ELECT BLADE 4.0 EZ CLEAN MEGAD (MISCELLANEOUS) ×2
ELECT CAUTERY BLADE 6.4 (BLADE) ×2 IMPLANT
ELECT PENCIL ROCKER SW 15FT (MISCELLANEOUS) ×2 IMPLANT
ELECT REM PT RETURN 9FT ADLT (ELECTROSURGICAL) ×2
ELECTRODE BLDE 4.0 EZ CLN MEGD (MISCELLANEOUS) ×1 IMPLANT
ELECTRODE REM PT RTRN 9FT ADLT (ELECTROSURGICAL) ×1 IMPLANT
GENERATOR PULSE PROCLAIM 5ELIT (Neuro Prosthesis/Implant) IMPLANT
GLOVE BIO SURGEON STRL SZ7 (GLOVE) ×2 IMPLANT
GLOVE BIOGEL PI IND STRL 7.0 (GLOVE) ×1 IMPLANT
GLOVE BIOGEL PI IND STRL 8.5 (GLOVE) ×1 IMPLANT
GLOVE BIOGEL PI INDICATOR 7.0 (GLOVE) ×1
GLOVE BIOGEL PI INDICATOR 8.5 (GLOVE) ×1
GLOVE SS N UNI LF 8.5 STRL (GLOVE) ×4 IMPLANT
GOWN STRL REUS W/ TWL LRG LVL3 (GOWN DISPOSABLE) ×2 IMPLANT
GOWN STRL REUS W/TWL 2XL LVL3 (GOWN DISPOSABLE) ×2 IMPLANT
GOWN STRL REUS W/TWL LRG LVL3 (GOWN DISPOSABLE) ×4
KIT BASIN OR (CUSTOM PROCEDURE TRAY) ×2 IMPLANT
KIT ROOM TURNOVER OR (KITS) ×2 IMPLANT
LAMI NARROW PRIPOLE 16CH (Orthopedic Implant) ×1 IMPLANT
NDL SPNL 18GX3.5 QUINCKE PK (NEEDLE) ×3 IMPLANT
NEEDLE 22X1 1/2 (OR ONLY) (NEEDLE) ×2 IMPLANT
NEEDLE SPNL 18GX3.5 QUINCKE PK (NEEDLE) ×6 IMPLANT
NS IRRIG 1000ML POUR BTL (IV SOLUTION) ×2 IMPLANT
PACK LAMINECTOMY ORTHO (CUSTOM PROCEDURE TRAY) ×2 IMPLANT
PACK UNIVERSAL I (CUSTOM PROCEDURE TRAY) ×2 IMPLANT
PAD ARMBOARD 7.5X6 YLW CONV (MISCELLANEOUS) ×4 IMPLANT
PULSE GENERATOR PROCLAIM 5ELIT (Neuro Prosthesis/Implant) ×2 IMPLANT
SPATULA SILICONE BRAIN 10MM (MISCELLANEOUS) ×1 IMPLANT
SPONGE LAP 4X18 X RAY DECT (DISPOSABLE) IMPLANT
SPONGE SURGIFOAM ABS GEL 100 (HEMOSTASIS) ×1 IMPLANT
STAPLER VISISTAT 35W (STAPLE) ×2 IMPLANT
SURGIFLO W/THROMBIN 8M KIT (HEMOSTASIS) ×2 IMPLANT
SUT BONE WAX W31G (SUTURE) ×2 IMPLANT
SUT ETHIBOND 2 OS 4 DA (SUTURE) ×1 IMPLANT
SUT ETHIBOND NAB CT1 #1 30IN (SUTURE) IMPLANT
SUT FIBERWIRE #2 38 T-5 BLUE (SUTURE)
SUT MNCRL AB 3-0 PS2 18 (SUTURE) ×4 IMPLANT
SUT VIC AB 1 CT1 18XCR BRD 8 (SUTURE) ×2 IMPLANT
SUT VIC AB 1 CT1 8-18 (SUTURE) ×4
SUT VIC AB 2-0 CT1 18 (SUTURE) ×3 IMPLANT
SUTURE FIBERWR #2 38 T-5 BLUE (SUTURE) ×1 IMPLANT
SYR BULB IRRIGATION 50ML (SYRINGE) ×2 IMPLANT
SYR CONTROL 10ML LL (SYRINGE) ×2 IMPLANT
TOWEL OR 17X24 6PK STRL BLUE (TOWEL DISPOSABLE) ×2 IMPLANT
TOWEL OR 17X26 10 PK STRL BLUE (TOWEL DISPOSABLE) ×2 IMPLANT
TRAY FOLEY W/METER SILVER 16FR (SET/KITS/TRAYS/PACK) IMPLANT
WATER STERILE IRR 1000ML POUR (IV SOLUTION) ×1 IMPLANT

## 2016-11-21 NOTE — H&P (Signed)
History of Present Illness  The patient is a 71 year old male who comes in today for a preoperative History and Physical. The patient is scheduled for a lumbar implantation of spinal cord stimulator to be performed by Dr. Debria Garretahari D. Shon BatonBrooks, MD [facility] on 11/21/16 . Please see the hospital record for complete dictated history and physical. The pt has a hx of a MI and he has stents placed.    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)  Shoulder impingement syndrome (M75.40)  Dupuytren's disease (M72.0)  Hallux rigidus of right foot (M20.21)  Lateral Epicondylitis (M77.10)  Aftercare (V58.9)  Lumbar spine pain (M54.5)  Degenerative lumbar disc (M51.36)  Tendonitis, Achilles, right (M76.61)  Pain of right hand (M79.641)  Trigger finger, left (M65.30)  Aftercare following surgery (V58.89)  (Marked as Inactive) Medial epicondylitis (726.31)  (Marked as Inactive) Fracture, Distal Phalanx, Hand, closed (816.02)  right-doi 01/22/2013 (Marked as Inactive) Problems Reconciled   Allergies  No Known Drug Allergies [10/11/2016]: Allergies Reconciled   Social History Tobacco use  Never smoker. 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 Cyclobenzaprine HCl (10MG  Tablet, 1 (one) Oral two times daily, as needed, Taken starting 09/29/2016) Active. Pristiq (25MG  Tablet ER 24HR,  Oral) Active. (qd) Pantoprazole Sodium (40MG  Tablet DR, Oral) Active. Gabapentin (300MG  Capsule, Oral) Active. (bid) Multiple Vitamin (1 (one) Oral) Active. Calcium Citrate (200MG  Tablet, 1 (one) Oral) Active. Co Q 10 (100MG  Capsule, Oral) Active. Replenex extra strength Glucosamine Active. NitroQuick (0.4MG  Tab Sublingual, Sublingual) Active. (prn) GuaiFENesin (400MG  Tablet, Oral) Active. (bid prn) Plavix (75MG  Tablet, Oral) Active. (qd) Aspirin EC Low Strength (81MG  Tablet DR, Oral) Active. (qd) Atorvastatin Calcium (80MG  Tablet, Oral) Active. (qd) Cetirizine HCl (10MG  Tablet, Oral) Active. (qd) Meclizine HCl (25MG  Tablet, Oral) Active. ([prn) Diltiazem HCl ER Coated Beads (240MG  Capsule ER 24HR, Oral) Active. BuPROPion HCl ER (XL) (300MG  Tablet ER 24HR, Oral) Active. (qd) ALPRAZolam (0.25MG  Tablet, Oral) Active. (prn) Medications Reconciled  Vitals  11/08/2016 9:49 AM Weight: 165 lb Height: 67in Body Surface Area: 1.86 m Body Mass Index: 25.84 kg/m  Temp.: 97.26F  Pulse: 91 (Regular)  BP: 135/84 (Sitting, Left Arm, Standard)  General General Appearance-Not in acute distress. Orientation-Oriented X3. Build & Nutrition-Well nourished and Well developed.  Integumentary General Characteristics Surgical Scars - surgical scarring consistent with previous lumbar surgery. Lumbar Spine-Skin examination of the lumbar spine is without deformity, skin lesions, lacerations or abrasions.  Chest and Lung Exam Auscultation Breath sounds - Normal and Clear.  Cardiovascular Auscultation Rhythm - Regular rate and rhythm.  Abdomen Palpation/Percussion Palpation and Percussion of the abdomen reveal - Soft, Non Tender and No Rebound tenderness.  Peripheral Vascular Lower Extremity Palpation - Posterior tibial pulse - Bilateral - 2+. Dorsalis pedis pulse - Bilateral - 2+.  Neurologic Sensation Lower Extremity - Bilateral - sensation is intact in  the lower extremity. Reflexes Patellar Reflex - Bilateral - 2+. Achilles Reflex - Bilateral - 2+. Clonus - Bilateral -  clonus not present. Hoffman's Sign - Bilateral - Hoffman's sign not present. Testing Seated Straight Leg Raise - Bilateral - Seated straight leg raise negative.  Musculoskeletal Spine/Ribs/Pelvis  Lumbosacral Spine: Inspection and Palpation - Tenderness - left lumbar paraspinals tender to palpation and right lumbar paraspinals tender to palpation. Strength and Tone: Strength - Hip Flexion - Bilateral - 5/5. Knee Extension - Bilateral - 5/5. Knee Flexion - Bilateral - 5/5. Ankle Dorsiflexion - Bilateral - 5/5. Ankle Plantarflexion - Bilateral - 5/5. ROM - Flexion - moderately decreased range of motion and painful. Extension - moderately decreased range of motion and painful. Left Lateral Bending - moderately decreased range of motion and painful. Right Lateral Bending - moderately decreased range of motion and painful. Right Rotation - moderately decreased range of motion and painful. Left Rotation - moderately decreased range of motion and painful. Pain - neither flexion or extension is more painful than the other. Lumbosacral Spine - Waddell's Signs - no Waddell's signs present. Lower Extremity Range of Motion - No true hip, knee or ankle pain with range of motion. Gait and Station - [employer] - no assistive devices.  Thoracic MRI: no significant canal stenosis to prevent implantation  The patient had near complete relief of his symptoms. He was walking fully erect and in the neutral position and his pain was significantly improved. Once the implant was removed both he and his wife have indicated that he is miserable again. He is not walking straight up and his pain is severe.  At this point in time using the model and the animated video, we discussed the spinal cord stimulator surgery. All of his questions were addressed. The plan is to move forward with permanent  implantation once we have clearance from his cardiologist and his primary care physician. Risks were reviewed, which include infection, bleeding, nerve damage, death, stroke, paralysis, major bleeding, blood clots, migration of the lead, failure of the battery, need for revision surgery, and leak of spinal fluid. All of their questions were encouraged and addressed. We will plan on moving forward with surgery in the very near future.

## 2016-11-21 NOTE — Evaluation (Signed)
Physical Therapy Evaluation Patient Details Name: [name] MRN: [id number] DOB: 01-Sep-1945 Today's Date: 11/21/2016   History of Present Illness  Pt is 71 y/o male s/p lumbar spinal cord stimulator insertion. PMH includes L trigger figer, dupuytren's disease, hallux rigidus R foot, HTN, CAD, COPD, and asthma.   Clinical Impression  Patient is s/p above surgery resulting in the deficits listed below (see PT Problem List). PTA, pt was using cane secondary to pain. Upon evaluation, pt did not have brace, so ambulation limited to within the room. Pt limited by pain, decreased strength, and unsteadiness, as well as baseline vertigo. Required min guard to min A for mobility. Pt reports his wife will be home 24/7, however, will not be able to assist physically secondary to bad fall earlier in the week.  Patient will benefit from skilled PT to increase their independence and safety with mobility (while adhering to their precautions) to allow discharge to the venue listed below. Will continue to follow acutely.      Follow Up Recommendations No PT follow up;Supervision/Assistance - 24 hour    Equipment Recommendations  None recommended by PT    Recommendations for Other Services       Precautions / Restrictions Precautions Precautions: Back Precaution Booklet Issued: Yes (comment) Precaution Comments: Reviewed back precaution handout with pt.  Required Braces or Orthoses: Spinal Brace Spinal Brace: Lumbar corset Restrictions Weight Bearing Restrictions: No      Mobility  Bed Mobility Overal bed mobility: Needs Assistance Bed Mobility: Rolling;Sidelying to Sit;Sit to Sidelying Rolling: Min guard Sidelying to sit: Min guard     Sit to sidelying: Min guard General bed mobility comments: Min guard for safety. Extended time required and use of bed rails.   Transfers Overall transfer level: Needs assistance Equipment used: 1 person hand held assist Transfers: Sit to/from  Stand Sit to Stand: Min assist         General transfer comment: Min A for lift assist and for steadying. Pt requiring extended time secondary to pain.   Ambulation/Gait Ambulation/Gait assistance: Min guard Ambulation Distance (Feet): 25 Feet Assistive device: 1 person hand held assist (IV pole ) Gait Pattern/deviations: Step-through pattern;Decreased stride length;Trunk flexed Gait velocity: Decreased Gait velocity interpretation: Below normal speed for age/gender General Gait Details: Slow, guarded gait secondary to pain. Required use of IV pole and HHA for steadying throughout gait. Pt did not have brace, so ambulation limited within the room to the bathroom. Pt with some dizziness secondary to baseline vertigo. Cues for upright posture and to look up during ambulation to assist with vertigo.   Stairs            Wheelchair Mobility    Modified Rankin (Stroke Patients Only)       Balance Overall balance assessment: Needs assistance Sitting-balance support: No upper extremity supported;Feet supported Sitting balance-Leahy Scale: Good     Standing balance support: Single extremity supported;During functional activity Standing balance-Leahy Scale: Poor Standing balance comment: Reliant on external support for steadying                             Pertinent Vitals/Pain Pain Assessment: Faces Faces Pain Scale: Hurts whole lot Pain Location: back  Pain Descriptors / Indicators: Aching;Operative site guarding;Sore Pain Intervention(s): Limited activity within patient's tolerance;Monitored during session;Repositioned    Home Living Family/patient expects to be discharged to:: Private residence Living Arrangements: Spouse/significant other Available Help at Discharge: Family;Available 24 hours/day (Wife  unale to physically assist; recently had bad fall) Type of Home: House Home Access: Stairs to enter Entrance Stairs-Rails: None Entrance Stairs-Number of  Steps: 3 Home Layout: One level Home Equipment: Walker - 2 wheels;Cane - single point      Prior Function Level of Independence: Independent with assistive device(s)         Comments: was using cane at baseline secondary to pain      Hand Dominance        Extremity/Trunk Assessment   Upper Extremity Assessment Upper Extremity Assessment: Defer to OT evaluation    Lower Extremity Assessment Lower Extremity Assessment: Generalized weakness (Grossly 3/5; sensory in tact)    Cervical / Trunk Assessment Cervical / Trunk Assessment: Other exceptions Cervical / Trunk Exceptions: s/p surgery   Communication   Communication: No difficulties  Cognition Arousal/Alertness: Awake/alert Behavior During Therapy: WFL for tasks assessed/performed Overall Cognitive Status: Within Functional Limits for tasks assessed                                        General Comments General comments (skin integrity, edema, etc.): Pt's wife had a bad fall at home earlier this week, so was not able to bring the brace and will not be able to provide physical assist for pt. Pt has baseline vertigo.     Exercises     Assessment/Plan    PT Assessment Patient needs continued PT services  PT Problem List Decreased strength;Decreased activity tolerance;Decreased balance;Decreased mobility;Decreased knowledge of use of DME;Decreased safety awareness;Decreased knowledge of precautions;Pain       PT Treatment Interventions DME instruction;Gait training;Stair training;Functional mobility training;Therapeutic activities;Therapeutic exercise;Balance training;Neuromuscular re-education;Patient/family education    PT Goals (Current goals can be found in the Care Plan section)  Acute Rehab PT Goals Patient Stated Goal: to decrease pain  PT Goal Formulation: With patient Time For Goal Achievement: 11/28/16 Potential to Achieve Goals: Good    Frequency Min 5X/week   Barriers to  discharge        Co-evaluation               AM-PAC PT "6 Clicks" Daily Activity  Outcome Measure Difficulty turning over in bed (including adjusting bedclothes, sheets and blankets)?: Total Difficulty moving from lying on back to sitting on the side of the bed? : Total Difficulty sitting down on and standing up from a chair with arms (e.g., wheelchair, bedside commode, etc,.)?: Total Help needed moving to and from a bed to chair (including a wheelchair)?: A Little Help needed walking in hospital room?: A Little Help needed climbing 3-5 steps with a railing? : A Little 6 Click Score: 12    End of Session Equipment Utilized During Treatment: Gait belt Activity Tolerance: Patient limited by pain;Other (comment) (no brace) Patient left: in bed;with call bell/phone within reach (with respiratory therapist in room ) Nurse Communication: Mobility status PT Visit Diagnosis: Other abnormalities of gait and mobility (R26.89);Pain Pain - part of body:  (back )    Time: 1610-9604 PT Time Calculation (min) (ACUTE ONLY): 1280 min   Charges:   PT Evaluation $PT Eval Low Complexity: 1 Procedure PT Treatments $Gait Training: 8-22 mins   PT G Codes:        Margot Chimes, PT, DPT  Acute Rehabilitation Services  Pager: [phone]   Melvyn Novas 11/21/2016, 1:45 PM

## 2016-11-21 NOTE — Anesthesia Procedure Notes (Signed)
Procedure Name: Intubation Date/Time: 11/21/2016 7:37 AM Performed by: Lovie CholOCK, Ranae Casebier K Pre-anesthesia Checklist: Patient identified, Emergency Drugs available, Suction available and Patient being monitored Patient Re-evaluated:Patient Re-evaluated prior to inductionOxygen Delivery Method: Circle System Utilized Preoxygenation: Pre-oxygenation with 100% oxygen Intubation Type: IV induction Ventilation: Mask ventilation without difficulty Laryngoscope Size: Miller and 3 Grade View: Grade I Tube type: Oral Tube size: 7.5 mm Number of attempts: 1 Airway Equipment and Method: Stylet and Oral airway Placement Confirmation: ETT inserted through vocal cords under direct vision,  positive ETCO2 and breath sounds checked- equal and bilateral Secured at: 20 cm Tube secured with: Tape Dental Injury: Teeth and Oropharynx as per pre-operative assessment

## 2016-11-21 NOTE — Anesthesia Preprocedure Evaluation (Addendum)
Anesthesia Evaluation  Patient identified by MRN, date of birth, ID band Patient awake    Reviewed: Allergy & Precautions, NPO status , Patient's Chart, lab work & pertinent test results  Airway Mallampati: II  TM Distance: >3 FB Neck ROM: Full    Dental no notable dental hx. (+) Caps, Dental Advisory Given   Pulmonary asthma , COPD,    Pulmonary exam normal breath sounds clear to auscultation       Cardiovascular hypertension, + angina + CAD and + Past MI  Normal cardiovascular exam Rhythm:Regular Rate:Normal     Neuro/Psych negative neurological ROS  negative psych ROS   GI/Hepatic Neg liver ROS, GERD  ,  Endo/Other  negative endocrine ROS  Renal/GU negative Renal ROS  negative genitourinary   Musculoskeletal negative musculoskeletal ROS (+)   Abdominal   Peds negative pediatric ROS (+)  Hematology negative hematology ROS (+)   Anesthesia Other Findings   Reproductive/Obstetrics negative OB ROS                            Anesthesia Physical Anesthesia Plan  ASA: III  Anesthesia Plan: General   Post-op Pain Management:    Induction: Intravenous  PONV Risk Score and Plan: 2 and Ondansetron and Dexamethasone  Airway Management Planned: Oral ETT  Additional Equipment:   Intra-op Plan:   Post-operative Plan: Extubation in OR  Informed Consent: I have reviewed the patients History and Physical, chart, labs and discussed the procedure including the risks, benefits and alternatives for the proposed anesthesia with the patient or authorized representative who has indicated his/her understanding and acceptance.   Dental advisory given  Plan Discussed with: CRNA  Anesthesia Plan Comments:         Anesthesia Quick Evaluation

## 2016-11-21 NOTE — Transfer of Care (Signed)
Immediate Anesthesia Transfer of Care Note  Patient: [name]  Procedure(s) Performed: Procedure(s) with comments: LUMBAR SPINAL CORD STIMULATOR INSERTION (N/A) - Requests 2.5 hrs  Patient Location: PACU  Anesthesia Type:General  Level of Consciousness: oriented, sedated and patient cooperative  Airway & Oxygen Therapy: Patient Spontanous Breathing and Patient connected to face mask oxygen  Post-op Assessment: Report given to RN and Post -op Vital signs reviewed and stable  Post vital signs: Reviewed  Last Vitals:  Vitals:   11/21/16 0624  BP: (!) 199/77  Pulse: 77  Resp: 20  Temp: 36.7 C    Last Pain:  Vitals:   11/21/16 0624  TempSrc: Oral         Complications: No apparent anesthesia complications

## 2016-11-21 NOTE — Op Note (Signed)
NAME:  [name], [name]           ACCOUNT NO.:  [id number]  MEDICAL RECORD NO.:  [id number]  LOCATION:                               FACILITY:  [facility]  PHYSICIAN:  Jermaine Neuharth D. Shon BatonBrooks, M.D. DATE OF BIRTH:  19-Dec-1945  DATE OF PROCEDURE:  11/21/2016 DATE OF DISCHARGE:  11/22/2016                              OPERATIVE REPORT   PREOPERATIVE DIAGNOSES: 1. Chronic pain syndrome. 2. Failed back syndrome.  POSTOPERATIVE DIAGNOSES: 1. Chronic pain syndrome. 2. Failed back syndrome.  OPERATIVE PROCEDURE:  Implantation of spinal cord stimulator.  FIRST ASSISTANT:  Anette Riedelarmen Mayo, my PA.  IMPLANT USED:  St. Jude's (Abbott) Tripole lead via T10 laminotomy with implantation of a right-sided non-rechargeable battery.  COMPLICATIONS:  None.  CONDITION:  Stable.  HISTORY:  [name] is a very pleasant 71 year old gentleman with previous 3-S1 lumbar spinal fusion, who continues to have significant debilitating back, buttock, and leg pain.  Attempts at conservative management have failed until he had a spinal cord stimulator trial.  The patient had excellent relief of his pain and as a result, we elected to proceed with surgery.  All appropriate risks, benefits, and alternatives were discussed with the patient and consent was obtained.  DESCRIPTION OF PROCEDURE:  The patient was brought to the operating room and placed supine on the operating table.  After successful induction of general anesthesia, endotracheal intubation, TEDs, SCDs were applied, and he was turned prone onto the Wilson frame.  All bony prominences were well padded, and the back was prepped and draped in a standard fashion.  Time-out was taken confirming patient, procedure, and all other pertinent important data.  Once this was confirmed, I then used fluoroscopic guidance to identify the T10 vertebral body.  I then marked the incision site and infiltrated with 0.25% Marcaine.  I also infiltrated the battery site, which I  had marked out in the preop holding area.  A midline incision was made and sharp dissection was carried out down to the deep fascia in the thoracic spine.  I then stripped the paraspinal muscles to expose the inferior portion of the T10 lamina and the T11 lamina.  Self-retaining retractor was placed, and an x-ray was taken again to confirm the T10 level.  Once this was identified, I removed the inferior portion of the spinous process of T10 with a double-action Leksell rongeur.  I then used a 2 mm Kerrison punch to create a laminotomy of T10.  I then dissected gently in the central raphe with a Penfield 4 to develop a plane underneath the ligamentum flavum.  Utilizing that 2 mm Kerrison, I resected the ligamentum flavum to expose the dorsal surface of the thecal sac.  I then gently passed a dural spatula and freely was able to advance to about the superior portion of the T9 vertebral body.  With the ease, in which this was implant, I obtained the actual implant and again gently inserted it until it was just into the inferior portion of the T8 vertebral body.  I confirmed with the spinal cord stimulator rep that I was spanning the appropriate level where he was getting adequate coverage during the trial.  I then secured the leads directly to  the spinous process of T11 using an Ethibond suture.  I made a second incision over the gluteal area, dissected down, and created a pocket.  I then passed the wires from the thoracic wound to the gluteal wound using a submuscular Passer. I then attached it to the battery, torqued it, so it was locked appropriately and then secured the battery to the deep fascia.  I confirmed that the implant was functioning.  All wounds were then copiously irrigated with normal saline.  I then closed each wound in a layered fashion with interrupted #1 Vicryl sutures and 2-0 Vicryl sutures, and then a 3-0 Monocryl.  Steri-Strips and dry dressings were applied, and the  patient was ultimately extubated, transferred to PACU without incident.  At the end of the case, all needle and sponge counts were correct.  There were no adverse intraoperative events.     Tysha Grismore D. Shon Baton, M.D.     DDB/MEDQ  D:  11/21/2016  T:  11/21/2016  Job:  [id number]

## 2016-11-21 NOTE — Anesthesia Postprocedure Evaluation (Signed)
Anesthesia Post Note  Patient: Reed PandyMitchell W Uber  Procedure(s) Performed: Procedure(s) (LRB): LUMBAR SPINAL CORD STIMULATOR INSERTION (N/A)     Patient location during evaluation: PACU Anesthesia Type: General Level of consciousness: awake and alert Pain management: pain level controlled Vital Signs Assessment: post-procedure vital signs reviewed and stable Respiratory status: spontaneous breathing, nonlabored ventilation, respiratory function stable and patient connected to nasal cannula oxygen Cardiovascular status: blood pressure returned to baseline and stable Postop Assessment: no signs of nausea or vomiting Anesthetic complications: no    Last Vitals:  Vitals:   11/21/16 1042 11/21/16 1104  BP: 122/83 137/81  Pulse: 82 84  Resp: 12 18  Temp: 36.4 C 36.4 C    Last Pain:  Vitals:   11/21/16 1200  TempSrc:   PainSc: 4                  Phillips Groutarignan, Jaydee Conran

## 2016-11-21 NOTE — Brief Op Note (Signed)
11/21/2016  9:18 AM  PATIENT:  [name]  71 y.o. male  PRE-OPERATIVE DIAGNOSIS:  Chronic pain syndrome, failed back syndrome  POST-OPERATIVE DIAGNOSIS:  Chronic pain syndrome, failed back syndrome  PROCEDURE:  Procedure(s) with comments: LUMBAR SPINAL CORD STIMULATOR INSERTION (N/A) - Requests 2.5 hrs  SURGEON:  Surgeon(s) and Role:    Venita Lick* Bernabe Dorce, MD - Primary  PHYSICIAN ASSISTANT:   ASSISTANTS: Carmen Mayo   ANESTHESIA:   general  EBL:  Total I/O In: 1000 [I.V.:1000] Out: 275 [Urine:200; Blood:75]  BLOOD ADMINISTERED:none  DRAINS: none   LOCAL MEDICATIONS USED:  MARCAINE     SPECIMEN:  No Specimen  DISPOSITION OF SPECIMEN:  N/A  COUNTS:  YES  TOURNIQUET:  * No tourniquets in log *  DICTATION: .Other Dictation: Dictation Number [id number]  PLAN OF CARE: Admit to inpatient   PATIENT DISPOSITION:  PACU - hemodynamically stable.

## 2016-11-21 NOTE — Discharge Instructions (Signed)
Call if unable to void or if incontinent of either bowel or bladder Ok to shower in 5 days

## 2016-11-21 NOTE — Op Note (Deleted)
  The note originally documented on this encounter has been moved the the encounter in which it belongs.  

## 2016-11-22 ENCOUNTER — Encounter (HOSPITAL_COMMUNITY): Payer: Self-pay | Admitting: Orthopedic Surgery

## 2016-11-22 DIAGNOSIS — G894 Chronic pain syndrome: Secondary | ICD-10-CM | POA: Diagnosis not present

## 2016-11-22 NOTE — Evaluation (Signed)
Occupational Therapy Evaluation Patient Details Name: [name] MRN: [id number] DOB: 07-26-1945 Today's Date: 11/22/2016    History of Present Illness Pt is 71 y/o male s/p lumbar spinal cord stimulator insertion. PMH includes L trigger figer, dupuytren's disease, hallux rigidus R foot, HTN, CAD, COPD, and asthma.    Clinical Impression   Patient evaluated by Occupational Therapy with no further acute OT needs identified. All education has been completed and the patient has no further questions. See below for any follow-up Occupational Therapy or equipment needs. OT to sign off. Thank you for referral.      Follow Up Recommendations  No OT follow up    Equipment Recommendations  None recommended by OT    Recommendations for Other Services       Precautions / Restrictions Precautions Precautions: Back Precaution Comments: handout provided and reviewed in detail for adls Required Braces or Orthoses: Spinal Brace Spinal Brace: Lumbar corset Restrictions Weight Bearing Restrictions: No      Mobility Bed Mobility Overal bed mobility: Independent             General bed mobility comments: received in sitting   Transfers Overall transfer level: Independent Equipment used: None                  Balance   Sitting-balance support: No upper extremity supported;Feet supported         Standing balance-Leahy Scale: Good                             ADL either performed or assessed with clinical judgement   ADL Overall ADL's : Modified independent                                       General ADL Comments: able to complete shower transfer, telling jokes and able to dress for home   Back handout provided and reviewed adls in detail. Pt educated on:  set an alarm at night for medication, avoid sitting for long periods of time, correct bed positioning for sleeping, correct sequence for bed mobility, avoiding lifting more than  5 pounds and never wash directly over incision. All education is complete and patient indicates understanding.  Pt educated on bathing and avoid washing directly on incision. Pt educated to use new wash cloth and towel each day. Pt educated to allow water to run across dressing and not to soak in a tub at this time. Pt advised RN will instruct on any bandages required otherwise is open to air.     Vision Baseline Vision/History: Wears glasses Wears Glasses: At all times       Perception     Praxis      Pertinent Vitals/Pain Pain Assessment: Faces Faces Pain Scale: Hurts a little bit Pain Location: back Pain Descriptors / Indicators: Aching;Operative site guarding;Sore Pain Intervention(s): Monitored during session;Premedicated before session;Repositioned     Hand Dominance Right   Extremity/Trunk Assessment Upper Extremity Assessment Upper Extremity Assessment: Overall WFL for tasks assessed   Lower Extremity Assessment Lower Extremity Assessment: Defer to PT evaluation   Cervical / Trunk Assessment Cervical / Trunk Assessment: Other exceptions Cervical / Trunk Exceptions: s/p surgery    Communication Communication Communication: No difficulties   Cognition Arousal/Alertness: Awake/alert Behavior During Therapy: WFL for tasks assessed/performed Overall Cognitive Status: Within Functional Limits for tasks assessed  General Comments  dressing dry and intact    Exercises     Shoulder Instructions      Home Living Family/patient expects to be discharged to:: Private residence Living Arrangements: Spouse/significant other Available Help at Discharge: Family;Available 24 hours/day Type of Home: House Home Access: Stairs to enter [employer] of Steps: 3 Entrance Stairs-Rails: None Home Layout: One level     Bathroom Shower/Tub: Producer, television/film/video: Handicapped height     Home  Equipment: Environmental consultant - 2 wheels;Cane - single point   Additional Comments: wife with recent fall and currently unable to (A)       Prior Functioning/Environment Level of Independence: Independent        Comments: was using cane at baseline secondary to pain         OT Problem List:        OT Treatment/Interventions:      OT Goals(Current goals can be found in the care plan section) Acute Rehab OT Goals Patient Stated Goal: to go home  OT Frequency:     Barriers to D/C:            Co-evaluation              AM-PAC PT "6 Clicks" Daily Activity     Outcome Measure Help from another person eating meals?: None Help from another person taking care of personal grooming?: None Help from another person toileting, which includes using toliet, bedpan, or urinal?: None Help from another person bathing (including washing, rinsing, drying)?: None Help from another person to put on and taking off regular upper body clothing?: None Help from another person to put on and taking off regular lower body clothing?: None 6 Click Score: 24   End of Session Nurse Communication: Mobility status;Precautions  Activity Tolerance: Patient tolerated treatment well Patient left: in bed;with call bell/phone within reach  OT Visit Diagnosis: Unsteadiness on feet (R26.81)                Time: 1610-9604 OT Time Calculation (min): 21 min Charges:  OT General Charges $OT Visit: 1 Procedure OT Evaluation $OT Eval Moderate Complexity: 1 Procedure G-Codes: OT G-codes **NOT FOR INPATIENT CLASS** Functional Assessment Tool Used: Clinical judgement Functional Limitation: Self care Self Care Current Status (V4098): 0 percent impaired, limited or restricted Self Care Discharge Status (J1914): 0 percent impaired, limited or restricted    Mateo Flow   OTR/L Pager: [phone] Office: [phone] .   Boone Master B 11/22/2016, 3:15 PM

## 2016-11-22 NOTE — Progress Notes (Signed)
    Subjective: Procedure(s) (LRB): LUMBAR SPINAL CORD STIMULATOR INSERTION (N/A) 1 Day Post-Op  Patient reports pain as 1 on 0-10 scale.  Reports decreased leg pain reports incisional back pain   Positive void Positive bowel movement Positive flatus negative chest pain or shortness of breath  Objective: Vital signs in last 24 hours: Temp:  [97.4 F (36.3 C)-98.1 F (36.7 C)] 98.1 F (36.7 C) (06/15 0415) Pulse Rate:  [81-96] 95 (06/15 0415) Resp:  [10-18] 18 (06/15 0415) BP: (108-137)/(72-86) 108/72 (06/15 0415) SpO2:  [93 %-100 %] 96 % (06/15 0415)  Intake/Output from previous day: 06/14 0701 - 06/15 0700 In: 1820 [P.O.:720; I.V.:1000; IV Piggyback:100] Out: 275 [Urine:200; Blood:75]  Labs: No results for input(s): WBC, RBC, HCT, PLT in the last 72 hours. No results for input(s): NA, K, CL, CO2, BUN, CREATININE, GLUCOSE, CALCIUM in the last 72 hours. No results for input(s): LABPT, INR in the last 72 hours.  Physical Exam: Neurologically intact ABD soft Intact pulses distally Incision: dressing C/D/I Compartment soft  Assessment/Plan: Patient stable  xrays n/a Continue mobilization with physical therapy Continue care  Advance diet Up with therapy  Doing well  plan on d/c after eval by [employer] rep  Venita Lickahari Foy Vanduyne, MD [facility] [phone]

## 2016-11-22 NOTE — Care Management CC44 (Signed)
Condition Code 44 Documentation Completed  Patient Details  Name: [name] MRN: [id number] Date of Birth: 1945-07-05   Condition Code 44 given:  Yes Patient signature on Condition Code 44 notice:  Yes Documentation of 2 MD's agreement:  Yes Code 44 added to claim:  Yes    Durenda GuthrieBrady, Aline Wesche Naomi, RN 11/22/2016, 10:28 AM

## 2016-11-22 NOTE — Care Management Obs Status (Signed)
MEDICARE OBSERVATION STATUS NOTIFICATION   Patient Details  Name: [name] MRN: [id number] Date of Birth: 03-19-1946   Medicare Observation Status Notification Given:  Yes    Durenda GuthrieBrady, Eames Dibiasio Naomi, RN 11/22/2016, 10:28 AM

## 2016-11-22 NOTE — Progress Notes (Signed)
Pt doing well. Spine stimulator rep turned unit on prior to D/C. Pt given D/C instructions with Rx's, verbal understanding was provided. Pt's IV was removed prior to D/C. Pt's incisions were changed per MD order. Pt D/C'd home via wheelchair @ 1025 per MD order. Pt is stable @ D/C and has no other needs at this time. Rema FendtAshley Lovella Hardie, RN

## 2016-11-22 NOTE — Progress Notes (Signed)
Physical Therapy Treatment Patient Details Name: [name] MRN: [id number] DOB: 1945-07-14 Today's Date: 11/22/2016    History of Present Illness Pt is 71 y/o male s/p lumbar spinal cord stimulator insertion. PMH includes L trigger figer, dupuytren's disease, hallux rigidus R foot, HTN, CAD, COPD, and asthma.     PT Comments    Pt demonstrated improved activity tolerance today. Pt ambulated 323ft with 1 hand held assistance and demonstrated good balance. Pt also completed 10 stairs with 1 hand held assistance while maintaining spinal precautions. PT reviewed spinal precautions and provided education about safety with car transfers. Current plan of care remains appropriate.   Follow Up Recommendations  No PT follow up;Supervision/Assistance - 24 hour     Equipment Recommendations  None recommended by PT    Recommendations for Other Services       Precautions / Restrictions Precautions Precautions: Back Required Braces or Orthoses: Spinal Brace Spinal Brace: Lumbar corset Restrictions Weight Bearing Restrictions: No    Mobility  Bed Mobility               General bed mobility comments: received in sitting   Transfers Overall transfer level: Needs assistance Equipment used: None Transfers: Sit to/from Stand Sit to Stand: Supervision         General transfer comment: Supervision for safety. Pt reports dizziness upon standing due to vertigo symptoms.   Ambulation/Gait Ambulation/Gait assistance: Min assist Ambulation Distance (Feet): 340 Feet Assistive device: 1 person hand held assist Gait Pattern/deviations: Step-through pattern;Decreased stride length Gait velocity: decreased Gait velocity interpretation: Below normal speed for age/gender General Gait Details: Pt uses a SPC and required HHA due to not having AD currently. Pt presented with good balance and stability.   Stairs Stairs: Yes   Stair Management: One rail Right;Alternating  pattern;Forwards Number of Stairs: 10 General stair comments: Pt required min A for HHA during stair navigation and VCs to maintain spinal precautions.  Wheelchair Mobility    Modified Rankin (Stroke Patients Only)       Balance Overall balance assessment: Needs assistance Sitting-balance support: No upper extremity supported;Feet supported Sitting balance-Leahy Scale: Good     Standing balance support: Single extremity supported;During functional activity Standing balance-Leahy Scale: Poor Standing balance comment: Relies on UE support for mobility                            Cognition Arousal/Alertness: Awake/alert Behavior During Therapy: WFL for tasks assessed/performed Overall Cognitive Status: Within Functional Limits for tasks assessed                                        Exercises      General Comments General comments (skin integrity, edema, etc.): Pt reporting vertigo at baseline. PT reviewd spinal precautions and safety with mobility and car transfers      Pertinent Vitals/Pain Pain Assessment: Faces Faces Pain Scale: Hurts even more Pain Location: back Pain Descriptors / Indicators: Aching;Operative site guarding;Sore Pain Intervention(s): Monitored during session    Home Living                      Prior Function            PT Goals (current goals can now be found in the care plan section) Acute Rehab PT Goals Patient Stated Goal: to go home PT  Goal Formulation: With patient Time For Goal Achievement: 11/28/16 Potential to Achieve Goals: Good Progress towards PT goals: Progressing toward goals    Frequency    Min 5X/week      PT Plan Current plan remains appropriate    Co-evaluation              AM-PAC PT "6 Clicks" Daily Activity  Outcome Measure  Difficulty turning over in bed (including adjusting bedclothes, sheets and blankets)?: A Little Difficulty moving from lying on back to sitting  on the side of the bed? : A Little Difficulty sitting down on and standing up from a chair with arms (e.g., wheelchair, bedside commode, etc,.)?: A Little Help needed moving to and from a bed to chair (including a wheelchair)?: A Little Help needed walking in hospital room?: A Little Help needed climbing 3-5 steps with a railing? : A Little 6 Click Score: 18    End of Session Equipment Utilized During Treatment: Gait belt Activity Tolerance: Patient tolerated treatment well Patient left: in bed;with call bell/phone within reach Nurse Communication: Mobility status PT Visit Diagnosis: Other abnormalities of gait and mobility (R26.89);Pain Pain - part of body:  (back)     Time: 0912-0925 PT Time Calculation (min) (ACUTE ONLY): 13 min  Charges:  $Gait Training: 8-22 mins                    G Codes:       Corlis Leakaylor [place], SPT  [phone]   Corlis Leakaylor Guardalabene 11/22/2016, 9:48 AM

## 2016-11-27 NOTE — Discharge Summary (Signed)
Physician Discharge Summary  Patient ID: [name] MRN: [id number] DOB/AGE: [birth year] 71 y.o.  Admit date: 11/21/2016 Discharge date: 11/22/16  Admission Diagnoses:  Chronic Pain Syndrome  Discharge Diagnoses:  Active Problems:   Chronic pain   Past Medical History:  Diagnosis Date  . ADD (attention deficit disorder)   . Allergic rhinitis    skin test 02/23/08 vaccine 2009; allergy shots weekly through Dr. Annamaria Boots  . Anemia    takes Fe -   . Anginal pain ([place])   . Ankylosing spondylitis ([place])   . Anxiety    sees therapist Jackelyn Hoehn  . Arthritis   . Asthma    Dr. Baird Lyons  . 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 CAD (coronary artery disease)    Dr. Wynonia Lawman  . COPD (chronic obstructive pulmonary disease) ([place])   . Depression   . Depression with anxiety   . Erectile dysfunction   . GERD (gastroesophageal reflux disease)   . History of benign positional vertigo   . History of kidney stones   . Hyperlipidemia   . Hypertension   . Hypogonadism male    prior use on Testim  . Insomnia    prior borderline sleep study  . Lumbar disc disease   . Mild concentric left ventricular hypertrophy (LVH)   . 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 Pneumonia   . Shortness of breath dyspnea     Surgeries: Procedure(s): LUMBAR SPINAL CORD STIMULATOR INSERTION on 11/21/2016   Consultants (if any):   Discharged Condition: Improved  Hospital Course: [name] is an 71 y.o. male who was admitted 11/21/2016 with a diagnosis of Chronic pain Syndrome and went to the operating room on 11/21/2016 and underwent the above named procedures.  Post op day one pt reports low level of pain controlled on oral medication.  Pt is voiding w/o difficulty.  Pt reports decreased leg pain.Pt is ambulating in hallway.  Pt released by PT for DC.  Pt met with [employer] rep before DC.   He was given perioperative antibiotics:  Anti-infectives    Start     Dose/Rate  Route Frequency Ordered Stop   11/21/16 1530  ceFAZolin (ANCEF) IVPB 2g/100 mL premix     2 g 200 mL/hr over 30 Minutes Intravenous Every 8 hours 11/21/16 1106 11/21/16 2346   11/21/16 0700  ceFAZolin (ANCEF) IVPB 2g/100 mL premix     2 g 200 mL/hr over 30 Minutes Intravenous To [facility] 11/20/16 0756 11/21/16 0740    .  He was given sequential compression devices, early ambulation, and TED for DVT prophylaxis.  He benefited maximally from the hospital stay and there were no complications.    Recent vital signs:  Vitals:   11/22/16 0415 11/22/16 0815  BP: 108/72 104/68  Pulse: 95 87  Resp: 18 18  Temp: 98.1 F (36.7 C) 97.9 F (36.6 C)    Recent laboratory studies:  Lab Results  Component Value Date   HGB 11.3 (L) 11/13/2016   HGB 12.3 (L) 10/15/2016   HGB 13.4 10/10/2015   Lab Results  Component Value Date   WBC 8.2 11/13/2016   PLT 228 11/13/2016   No results found for: INR Lab Results  Component Value Date   NA 139 11/13/2016   K 3.6 11/13/2016   CL 108 11/13/2016   CO2 24 11/13/2016   BUN 8 11/13/2016   CREATININE 1.03 11/13/2016   GLUCOSE 96 11/13/2016  Discharge Medications:   Allergies as of 11/22/2016      Reactions   Oxycodone Nausea And Vomiting      Medication List    STOP taking these medications   diclofenac sodium 1 % Gel Commonly known as:  VOLTAREN     TAKE these medications   albuterol 108 (90 Base) MCG/ACT inhaler Commonly known as:  VENTOLIN HFA Inhale 2 puffs into the lungs every 4 (four) hours as needed.   ALPRAZolam 0.25 MG tablet Commonly known as:  XANAX TAKE 1 TABLET BY MOUTH 3 TIMES A DAY AS NEEDED FOR ANXIETY   aspirin EC 81 MG tablet Take 81 mg by mouth daily.   atorvastatin 80 MG tablet Commonly known as:  LIPITOR Take 1 tablet (80 mg total) by mouth daily.   budesonide-formoterol 160-4.5 MCG/ACT inhaler Commonly known as:  SYMBICORT Inhale 2 puffs into the lungs 2 (two) times daily.   buPROPion  300 MG 24 hr tablet Commonly known as:  WELLBUTRIN XL Take 1 tablet (300 mg total) by mouth daily.   calcium carbonate 600 MG Tabs tablet Commonly known as:  OS-CAL Take 600 mg by mouth daily with breakfast.   cetirizine 10 MG tablet Commonly known as:  ZYRTEC Take 10 mg by mouth daily.   cholecalciferol 1000 units tablet Commonly known as:  VITAMIN D Take 1,000 Units by mouth daily.   clopidogrel 75 MG tablet Commonly known as:  PLAVIX Take 75 mg by mouth daily.   CoQ10 200 MG Caps Take 1 capsule by mouth daily.   desvenlafaxine 50 MG 24 hr tablet Commonly known as:  PRISTIQ TAKE 1 TABLET BY MOUTH  DAILY   diltiazem 240 MG 24 hr capsule Commonly known as:  CARDIZEM CD Take 240 mg by mouth daily.   Flax Seed Oil 1000 MG Caps Take 1 capsule by mouth daily.   gabapentin 300 MG capsule Commonly known as:  NEURONTIN Take 1 capsule (300 mg total) by mouth at bedtime. What changed:  when to take this   GLUCOSAMINE CHONDROITIN COMPLX PO Take 1 tablet by mouth daily.   guaiFENesin 600 MG 12 hr tablet Commonly known as:  MUCINEX Take 600 mg by mouth 2 (two) times daily as needed for cough.   HYDROcodone-acetaminophen 10-325 MG tablet Commonly known as:  NORCO Take 1 tablet by mouth every 6 (six) hours as needed.   meclizine 25 MG tablet Commonly known as:  ANTIVERT Take 1 tablet by mouth  twice a day as needed for dizziness   methocarbamol 500 MG tablet Commonly known as:  ROBAXIN Take 1 tablet (500 mg total) by mouth 3 (three) times daily as needed for muscle spasms.   methylphenidate 36 MG CR tablet Commonly known as:  CONCERTA Take 1 tablet (36 mg total) by mouth daily.   montelukast 10 MG tablet Commonly known as:  SINGULAIR Take 1 tablet (10 mg total) by mouth at bedtime.   multivitamin with minerals tablet Take 1 tablet by mouth daily.   nitroGLYCERIN 0.4 MG SL tablet Commonly known as:  NITROSTAT Place 0.4 mg under the tongue every 5 (five) minutes  as needed.   ondansetron 4 MG tablet Commonly known as:  ZOFRAN Take 1 tablet (4 mg total) by mouth every 8 (eight) hours as needed for nausea or vomiting.   pantoprazole 40 MG tablet Commonly known as:  PROTONIX Take 1 tablet (40 mg total) by mouth daily.   QUERCETIN PO Take 500 mg elemental calcium/kg/hr by mouth daily.  Diagnostic Studies: Dg Chest 2 View  Result Date: 11/13/2016 CLINICAL DATA:  Dry cough for 3 weeks.  Preop lumbar surgery EXAM: CHEST  2 VIEW COMPARISON:  10/06/2012 FINDINGS: Heart and mediastinal contours are within normal limits. No focal opacities or effusions. No acute bony abnormality. IMPRESSION: No active cardiopulmonary disease. Electronically Signed   By: Rolm Baptise M.D.   On: 11/13/2016 11:55   Dg Thoracolumabar Spine  Result Date: 11/21/2016 CLINICAL DATA:  71 year old male with history of spinal cord stimulator insertion. EXAM: THORACOLUMBAR SPINE 1V COMPARISON:  No priors. FINDINGS: 2 intraoperative fluoroscopic views of the lower thoracic spine demonstrate placement of a spinal cord stimulator which appears likely to project over the T9 region (difficult to say for certain without full visualization of the thoracic spine). IMPRESSION: 1. Intraoperative documentation of spinal cord stimulator placement in the lower thoracic region, as above. Electronically Signed   By: Vinnie Langton M.D.   On: 11/21/2016 09:21   Dg C-arm 1-60 Min  Result Date: 11/21/2016 CLINICAL DATA:  71 year old male with history of spinal cord stimulator insertion. EXAM: THORACOLUMBAR SPINE 1V COMPARISON:  No priors. FINDINGS: 2 intraoperative fluoroscopic views of the lower thoracic spine demonstrate placement of a spinal cord stimulator which appears likely to project over the T9 region (difficult to say for certain without full visualization of the thoracic spine). IMPRESSION: 1. Intraoperative documentation of spinal cord stimulator placement in the lower thoracic region, as  above. Electronically Signed   By: Vinnie Langton M.D.   On: 11/21/2016 09:21   Ct Maxillofacial Wo Cm  Result Date: 11/08/2016 CLINICAL DATA:  Acute recurrent frontal sinusitis. No relief after 3 rounds of antibiotics. Cough for 3 weeks. Surgery for deviated nasal septum in 1980. EXAM: CT MAXILLOFACIAL WITHOUT CONTRAST TECHNIQUE: Multidetector CT imaging of the maxillofacial structures was performed. Multiplanar CT image reconstructions were also generated. A small metallic BB was placed on the right temple in order to reliably differentiate right from left. COMPARISON:  None. FINDINGS: Osseous: No lytic or blastic lesions are present. No acute or healing fractures are present. Orbits: The globes and orbits are within normal limits. Sinuses: The left tonsillar process is opposed to the lamina papyracea with partial obstruction of the ostiomeatal complex. There is soft tissue obstructing the ostiomeatal complex on the right. The left maxillary sinus is shrunken with circumferential mucosal thickening. This suggests a history of chronic disease. Mucosal thickening is noted along the floor of the right maxillary sinus. Mild mucosal thickening is present within the anterior ethmoid air cells. There is focal mucosal thickening lateral to the sphenoethmoid recess on the right. A small polyp or mucous retention cyst is present in the left sphenoid sinus. The sphenoid sinuses are otherwise clear. The cribriform plate is within normal limits bilaterally. The lateral lamella measures 6 mm, within normal limits. There is normal pneumatization of the frontal sinuses along the orbital roof and the sphenoid sinuses posteriorly. Soft tissues: The soft tissues the face are unremarkable. There is irregularity along a mucosa of the inferior turbinates bilaterally, suggesting small polyps. The nasal cavity is clear. Nasal septum is midline. Limited intracranial: Mild generalized atrophy and white matter disease is present. No  focal lesions are evident. IMPRESSION: 1. Lateral positioning of the onset process with chronic obstruction of the ostiomeatal complex. A shrunken left maxillary sinus supports a history of chronic disease. 2. Moderate mucosal thickening in the left maxillary sinus is most prominent in item mild mucosal thickening inferiorly in the right maxillary sinus  with soft tissue obstructing the ostiomeatal complex. 3. Irregularity of the mucosa in the nasal cavity bilaterally suggesting small nasal polyps. 4. Mild mucosal thickening throughout the ethmoid air cells and inferior frontal sinuses. 5. Minimal anterior sphenoid sinus disease as described. 6. Mild cerebral atrophy and white matter disease likely reflects the sequela of chronic microvascular ischemia. Electronically Signed   By: San Morelle M.D.   On: 11/08/2016 08:39    Disposition: 01-Home or Self Care   Pt will present to clinic in 2 weeks Post op medication provided  Discharge Instructions    Incentive spirometry RT    Complete by:  As directed       Follow-up Clio, Dahari, MD In 2 weeks.   Specialty:  Orthopedic Surgery Why:  If symptoms worsen, For suture removal Contact information: [address] [place] [address] [phone]            Signed: Valinda Hoar 11/27/2016, 12:02 PM

## 2016-11-29 ENCOUNTER — Encounter: Payer: Self-pay | Admitting: Family Medicine

## 2016-11-29 ENCOUNTER — Ambulatory Visit (INDEPENDENT_AMBULATORY_CARE_PROVIDER_SITE_OTHER): Payer: Medicare Other | Admitting: Family Medicine

## 2016-11-29 VITALS — BP 120/74 | HR 79 | Temp 98.7°F | Wt 171.8 lb

## 2016-11-29 DIAGNOSIS — N410 Acute prostatitis: Secondary | ICD-10-CM

## 2016-11-29 DIAGNOSIS — R300 Dysuria: Secondary | ICD-10-CM

## 2016-11-29 LAB — POCT URINALYSIS DIP (PROADVANTAGE DEVICE)
Bilirubin, UA: NEGATIVE
Blood, UA: NEGATIVE
Glucose, UA: NEGATIVE mg/dL
Ketones, POC UA: NEGATIVE mg/dL
Leukocytes, UA: NEGATIVE
Nitrite, UA: NEGATIVE
Protein Ur, POC: NEGATIVE mg/dL
SPECIFIC GRAVITY, URINE: 1.03
UUROB: NEGATIVE
pH, UA: 6 (ref 5.0–8.0)

## 2016-11-29 MED ORDER — DOXYCYCLINE HYCLATE 100 MG PO TABS: 100.0000 mg | ORAL_TABLET | Freq: Two times a day (BID) | ORAL | 0 refills | Status: DC

## 2016-11-29 NOTE — Progress Notes (Signed)
Subjective: Chief Complaint  Patient presents with  . UTI    possible UTI- burning. started this morning     [name] is a 71 y.o. male who complains of possible urinary tract infection.  He has had symptoms for 1 day.  Symptoms include burning with urination, hesitancy. Patient denies fever, chills, abdominal pain, back pain, N/V.  Last UTI was years ago.   Using nothing for current symptoms.    Patient does not have a history of recurrent UTI. Patient does not have a history of pyelonephritis.  No other aggravating or relieving factors.  No other c/o.  Past Medical History:  Diagnosis Date  . ADD (attention deficit disorder)   . Allergic rhinitis    skin test 02/23/08 vaccine 2009; allergy shots weekly through Dr. Maple HudsonYoung  . Anemia    takes Fe -   . Anginal pain (HCC)   . Ankylosing spondylitis (HCC)   . Anxiety    sees therapist Vernell LeepKen Frazier  . Arthritis   . Asthma    Dr. Jetty Duhamellinton Young  . 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 CAD (coronary artery disease)    Dr. Donnie Ahoilley  . COPD (chronic obstructive pulmonary disease) (HCC)   . Depression   . Depression with anxiety   . Erectile dysfunction   . GERD (gastroesophageal reflux disease)   . History of benign positional vertigo   . History of kidney stones   . Hyperlipidemia   . Hypertension   . Hypogonadism male    prior use on Testim  . Insomnia    prior borderline sleep study  . Lumbar disc disease   . Mild concentric left ventricular hypertrophy (LVH)   . 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 Pneumonia   . Shortness of breath dyspnea     ROS as in subjective  Reviewed allergies, medications, past medical, surgical, and social history.    Objective: Vitals:   11/29/16 1058  BP: 120/74  Pulse: 79  Temp: 98.7 F (37.1 C)    General appearance: alert, no distress, WD/WN, male Abdomen: +bs, soft, non tender, non distended, no masses, no hepatomegaly, no splenomegaly, no  bruits Back: no CVA tenderness GU: refused      Laboratory:  Urine dipstick: negative for all components.       Assessment: Dysuria - Plan: doxycycline (VIBRA-TABS) 100 MG tablet, Urine Culture, GC/Chlamydia Probe Amp  Burning with urination - Plan: POCT Urinalysis DIP (Proadvantage Device)  Acute prostatitis    Plan: Discussed symptoms, diagnosis, possible complications, and usual course of illness.  Doxycycline prescribed.   Advised increased water intake, can use OTC Tylenol for pain.      Urine culture sent. Will also send for GC/CT testing.     Call or return if worse or not improving.

## 2016-11-30 LAB — URINE CULTURE: Organism ID, Bacteria: NO GROWTH

## 2016-12-02 LAB — GC/CHLAMYDIA PROBE AMP
CT PROBE, AMP APTIMA: NOT DETECTED
GC Probe RNA: NOT DETECTED

## 2017-01-02 ENCOUNTER — Other Ambulatory Visit: Payer: Self-pay | Admitting: Family Medicine

## 2017-01-02 DIAGNOSIS — F418 Other specified anxiety disorders: Secondary | ICD-10-CM

## 2017-01-03 NOTE — Telephone Encounter (Signed)
Ok to refill 

## 2017-01-27 NOTE — Progress Notes (Signed)
   11/21/16 1249  PT G-Codes **NOT FOR INPATIENT CLASS**  Functional Assessment Tool Used AM-PAC 6 Clicks Basic Mobility  Functional Limitation Mobility: Walking and moving around  Mobility: Walking and Moving Around Current Status (Q6761) CL  Mobility: Walking and Moving Around Goal Status (P5093) CI   Adding G codes  Gladys Damme, PT, DPT  Acute Rehabilitation Services  Pager: [phone]

## 2017-01-30 DIAGNOSIS — R519 Headache, unspecified: Secondary | ICD-10-CM | POA: Insufficient documentation

## 2017-02-19 ENCOUNTER — Telehealth: Payer: Self-pay | Admitting: Family Medicine

## 2017-02-19 MED ORDER — METHYLPHENIDATE HCL ER (OSM) 36 MG PO TBCR: 36.0000 mg | EXTENDED_RELEASE_TABLET | Freq: Every day | ORAL | 0 refills | Status: DC

## 2017-02-19 NOTE — Telephone Encounter (Signed)
Pt called for refill of Concerta 3 months.

## 2017-03-10 ENCOUNTER — Other Ambulatory Visit: Payer: Self-pay | Admitting: Family Medicine

## 2017-03-10 NOTE — Telephone Encounter (Signed)
ok 

## 2017-03-10 NOTE — Telephone Encounter (Signed)
Is this okay to call in? 

## 2017-03-10 NOTE — Telephone Encounter (Signed)
Called in med to pharmacy  

## 2017-06-08 ENCOUNTER — Other Ambulatory Visit: Payer: Self-pay | Admitting: Internal Medicine

## 2017-06-08 ENCOUNTER — Other Ambulatory Visit: Payer: Self-pay | Admitting: Family Medicine

## 2017-06-08 DIAGNOSIS — F418 Other specified anxiety disorders: Secondary | ICD-10-CM

## 2017-06-09 NOTE — Telephone Encounter (Signed)
Ok to renew?  

## 2017-06-26 ENCOUNTER — Other Ambulatory Visit: Payer: Self-pay | Admitting: Family Medicine

## 2017-06-26 NOTE — Telephone Encounter (Signed)
Pt is requesting desvenlafax refill . Pt has no appointment on the books.Please advise [facility]

## 2017-06-26 NOTE — Telephone Encounter (Signed)
Is this okay to refill? 

## 2017-07-10 ENCOUNTER — Telehealth: Payer: Self-pay | Admitting: Family Medicine

## 2017-07-10 MED ORDER — METHYLPHENIDATE HCL ER (OSM) 36 MG PO TBCR: 36.0000 mg | EXTENDED_RELEASE_TABLET | Freq: Every day | ORAL | 0 refills | Status: DC

## 2017-07-10 NOTE — Telephone Encounter (Signed)
Pt called and requested refills on Concerta. Pt uses [facility] [address] and can be reached at [phone].

## 2017-08-07 ENCOUNTER — Other Ambulatory Visit: Payer: Self-pay

## 2017-08-07 ENCOUNTER — Other Ambulatory Visit: Payer: Self-pay | Admitting: Internal Medicine

## 2017-08-07 DIAGNOSIS — J4530 Mild persistent asthma, uncomplicated: Secondary | ICD-10-CM

## 2017-08-07 DIAGNOSIS — J301 Allergic rhinitis due to pollen: Secondary | ICD-10-CM

## 2017-08-07 MED ORDER — MONTELUKAST SODIUM 10 MG PO TABS: 10.0000 mg | ORAL_TABLET | Freq: Every day | ORAL | 3 refills | Status: DC

## 2017-08-20 ENCOUNTER — Other Ambulatory Visit: Payer: Self-pay | Admitting: Family Medicine

## 2017-08-20 NOTE — Telephone Encounter (Signed)
Please advise [facility] [address]. Thanks [employer]

## 2017-08-25 ENCOUNTER — Telehealth: Payer: Self-pay | Admitting: Family Medicine

## 2017-08-25 NOTE — Telephone Encounter (Signed)
Called pt he said he will go by and pick it up. KH

## 2017-08-25 NOTE — Telephone Encounter (Signed)
Pt called for refills of Concerta. Please send to [facility] [address]. Pt can be reached at [phone].

## 2017-08-25 NOTE — Telephone Encounter (Signed)
He still has a prescription in the system for pickup on March 31 at his drugstore

## 2017-08-28 ENCOUNTER — Ambulatory Visit (INDEPENDENT_AMBULATORY_CARE_PROVIDER_SITE_OTHER): Payer: Medicare Other | Admitting: Internal Medicine

## 2017-08-28 ENCOUNTER — Encounter: Payer: Self-pay | Admitting: Internal Medicine

## 2017-08-28 VITALS — BP 132/84 | HR 109 | Ht 67.0 in | Wt 169.2 lb

## 2017-08-28 DIAGNOSIS — J301 Allergic rhinitis due to pollen: Secondary | ICD-10-CM

## 2017-08-28 DIAGNOSIS — J45909 Unspecified asthma, uncomplicated: Secondary | ICD-10-CM | POA: Diagnosis not present

## 2017-08-28 DIAGNOSIS — J4531 Mild persistent asthma with (acute) exacerbation: Secondary | ICD-10-CM

## 2017-08-28 MED ORDER — UMECLIDINIUM-VILANTEROL 62.5-25 MCG/INH IN AEPB: 1.0000 | INHALATION_SPRAY | Freq: Every day | RESPIRATORY_TRACT | 12 refills | Status: DC

## 2017-08-28 MED ORDER — AZELASTINE HCL 0.1 % NA SOLN
2.0000 | Freq: Two times a day (BID) | NASAL | 12 refills | Status: DC
Start: 2017-08-28 — End: 2020-08-30

## 2017-08-28 MED ORDER — UMECLIDINIUM-VILANTEROL 62.5-25 MCG/INH IN AEPB: 1.0000 | INHALATION_SPRAY | Freq: Every day | RESPIRATORY_TRACT | 0 refills | Status: DC

## 2017-08-28 NOTE — Progress Notes (Signed)
Patient ID: Dwayne PandyMitchell [name], male    DOB: 10-Apr-1946, 72 y.o.   MRN: [id number]  HPI M never smoker followed for asthma, allergic rhinitis complicated by hx ADHD, CAD/ MI, GERD, anemia    --------------------------------------------------------------------------------  04/21/15-72 yo M never smoker followed for asthma, allergic rhinitis complicated by hx ADHD, CAD/ MI, GERD, anemia   FOLLOWS FOR: Pt states that he has been doing well off the allergy vaccine. No complaints.  Allergy vaccine dc'd 01/04/15 for observation He has been indoors following spine surgery in July with little exposure to outdoor allergens or weather changes. Denies wheezing or rhinitis symptoms. Has not needed inhalers at all. As continue guaifenesin for thick mucus and mild occasional cough.  08/28/17- 72 yo M never smoker followed for asthma, allergic rhinitis complicated by hx ADHD, CAD/ MI, GERD, anemia, back pain/spinal stimulator -----Tightness in chest with chest congestion. SOB and cough at times.  CXR 11/13/16- No active cardiopulmonary disease. Chest is felt tighter and noticing more postnasal drip over the last 2 weeks.  No obvious infection or acute event.  Using guaifenesin twice daily.  Agrees timing suggests the current onset of pollen season.  Review of Systems-see HPI + = positive Constitutional:   No-   weight loss, night sweats, fevers, chills, fatigue, lassitude. HEENT:   No-current  headaches, difficulty swallowing, tooth/dental problems, sore throat,       No-  sneezing, itching, ear ache, no-nasal congestion, post nasal drip,  CV:  No-chest pain, No- orthopnea, PND, swelling in lower extremities, anasarca, dizziness, palpitations Resp: No-   shortness of breath with exertion or at rest.              No-   productive cough,  + non-productive cough,  No- coughing up of blood.              No-   change in color of mucus.  No- wheezing.   Skin: No-   rash or lesions. GI:  No-   heartburn,  indigestion, abdominal pain, nausea, vomiting,  GU:  MS:  No-   joint pain or swelling. Neuro-     nothing unusual Psych:  No- change in mood or affect. No depression or anxiety.  No memory loss.     Objective:   Physical Exam General- Alert, Oriented, Affect-appropriate, Distress- none acute Skin- rash-none, lesions- none, excoriation- none Lymphadenopathy- none Head- atraumatic            Eyes- Gross vision intact, PERRLA, conjunctivae clear secretions            Ears- Hearing, canals-normal            Nose- Clear, no-Septal dev, mucus, polyps, erosion, perforation             Throat- Mallampati II , mucosa clear, drainage- none, tonsils- atrophic.  Neck- flexible , trachea midline, no stridor , thyroid nl, carotid no bruit Chest - symmetrical excursion , unlabored           Heart/CV- RRR , no murmur , no gallop  , no rub, nl s1 s2                           - JVD- none , edema- none, stasis changes- none, varices- none           Lung-+ coarse , good airflow,wheeze- none, cough- none , dullness-none, rub- none  Chest wall- + spinal stimulator Abd- Br/ Gen/ Rectal- Not done, not indicated Extrem- Normal apparent strength and mobility Neuro- grossly intact to observation

## 2017-08-28 NOTE — Patient Instructions (Signed)
Script sent for Astelin nasal spray    1-2 puffs, up to twice daily as needed for postnasal drip.  Sample Anoro inhaler     Inhale 1 puff, once daily as a bronchodilator  Please call as needed

## 2017-09-03 NOTE — Assessment & Plan Note (Signed)
Reviewed OTC therapies and suggested Flonase/Claritin

## 2017-09-03 NOTE — Assessment & Plan Note (Signed)
We discussed treatment options. Plan-try sample Anoro inhaler

## 2017-10-31 ENCOUNTER — Telehealth: Payer: Self-pay

## 2017-10-31 MED ORDER — DESVENLAFAXINE SUCCINATE ER 50 MG PO TB24: 50.0000 mg | ORAL_TABLET | Freq: Every day | ORAL | 0 refills | Status: DC

## 2017-10-31 NOTE — Telephone Encounter (Signed)
Received fax refill request for new RX Desventlafax Suc ER tb to [facility].

## 2017-11-14 ENCOUNTER — Telehealth: Payer: Self-pay | Admitting: Family Medicine

## 2017-11-14 MED ORDER — METHYLPHENIDATE HCL ER (OSM) 36 MG PO TBCR: 36.0000 mg | EXTENDED_RELEASE_TABLET | Freq: Every day | ORAL | 0 refills | Status: DC

## 2017-11-14 NOTE — Telephone Encounter (Signed)
Pt called and requested refills on Concerta. Please send to [facility] [address]. Pt can be reached at [phone].

## 2018-01-09 ENCOUNTER — Other Ambulatory Visit: Payer: Self-pay

## 2018-01-09 MED ORDER — METHYLPHENIDATE HCL ER (OSM) 36 MG PO TBCR: 36.0000 mg | EXTENDED_RELEASE_TABLET | Freq: Every day | ORAL | 0 refills | Status: DC

## 2018-01-09 NOTE — Telephone Encounter (Signed)
Pt has called requesting a refill request for Concerta. Please advise.

## 2018-01-09 NOTE — Telephone Encounter (Signed)
Time for an office visit 

## 2018-01-12 NOTE — Telephone Encounter (Signed)
Wife stated she will have patient call back and schedule his appointment.

## 2018-01-30 ENCOUNTER — Ambulatory Visit
Admission: RE | Admit: 2018-01-30 | Discharge: 2018-01-30 | Disposition: A | Discharge status: Home / Self Care | Payer: Medicare Other | Source: Ambulatory Visit | Attending: Physician Assistant | Admitting: Physician Assistant

## 2018-01-30 ENCOUNTER — Other Ambulatory Visit (INDEPENDENT_AMBULATORY_CARE_PROVIDER_SITE_OTHER): Payer: Self-pay | Admitting: Physician Assistant

## 2018-01-30 DIAGNOSIS — M79605 Pain in left leg: Secondary | ICD-10-CM

## 2018-01-30 DIAGNOSIS — M545 Low back pain, unspecified: Secondary | ICD-10-CM

## 2018-02-02 ENCOUNTER — Encounter: Payer: Self-pay | Admitting: Family Medicine

## 2018-02-02 ENCOUNTER — Ambulatory Visit (INDEPENDENT_AMBULATORY_CARE_PROVIDER_SITE_OTHER): Payer: Medicare Other | Admitting: Family Medicine

## 2018-02-02 VITALS — BP 114/70 | HR 90 | Temp 97.7°F | Wt 171.8 lb

## 2018-02-02 DIAGNOSIS — J301 Allergic rhinitis due to pollen: Secondary | ICD-10-CM

## 2018-02-02 DIAGNOSIS — K219 Gastro-esophageal reflux disease without esophagitis: Secondary | ICD-10-CM

## 2018-02-02 DIAGNOSIS — F909 Attention-deficit hyperactivity disorder, unspecified type: Secondary | ICD-10-CM | POA: Diagnosis not present

## 2018-02-02 DIAGNOSIS — F418 Other specified anxiety disorders: Secondary | ICD-10-CM

## 2018-02-02 DIAGNOSIS — I10 Essential (primary) hypertension: Secondary | ICD-10-CM

## 2018-02-02 DIAGNOSIS — I2510 Atherosclerotic heart disease of native coronary artery without angina pectoris: Secondary | ICD-10-CM

## 2018-02-02 DIAGNOSIS — J4530 Mild persistent asthma, uncomplicated: Secondary | ICD-10-CM | POA: Diagnosis not present

## 2018-02-02 DIAGNOSIS — E785 Hyperlipidemia, unspecified: Secondary | ICD-10-CM

## 2018-02-02 DIAGNOSIS — G8929 Other chronic pain: Secondary | ICD-10-CM

## 2018-02-02 MED ORDER — PANTOPRAZOLE SODIUM 40 MG PO TBEC: 40.0000 mg | DELAYED_RELEASE_TABLET | Freq: Every day | ORAL | 3 refills | Status: DC

## 2018-02-02 MED ORDER — ATORVASTATIN CALCIUM 80 MG PO TABS: 80.0000 mg | ORAL_TABLET | Freq: Every day | ORAL | 3 refills | Status: DC

## 2018-02-02 MED ORDER — METHYLPHENIDATE HCL ER (OSM) 36 MG PO TBCR: 36.0000 mg | EXTENDED_RELEASE_TABLET | Freq: Every day | ORAL | 0 refills | Status: DC

## 2018-02-02 MED ORDER — BUPROPION HCL ER (XL) 300 MG PO TB24: 300.0000 mg | ORAL_TABLET | Freq: Every day | ORAL | 3 refills | Status: DC

## 2018-02-02 MED ORDER — DESVENLAFAXINE SUCCINATE ER 50 MG PO TB24: 50.0000 mg | ORAL_TABLET | Freq: Every day | ORAL | 3 refills | Status: DC

## 2018-02-02 NOTE — Progress Notes (Signed)
   Subjective:    Patient ID: [name], male    DOB: July 07, 1945, 72 y.o.   MRN: [id number]  HPI He is here for medication management visit.  He continues to have back pain.  He has had 4 surgeries and presently has a nerve stimulator.  He is also had physical therapy.  He continues to be followed by [name] for this.  He does have a history of asthma but presently is using Symbicort only once per month.  He continues to do fairly well on his Wellbutrin and Pristiq and does use Xanax several times per week usually for sleep and occasionally for anxiety.  He does see [name].  His allergies seem to be seasonal and he is having no present trouble.  He does use Singulair on an as-needed basis.  He is no longer taking Neurontin.  He does use a PPI for his GERD.  He is followed by cardiology and is presently having no chest pain, shortness of breath, PND or DOE.  He does have ADD and does very well on his present dosing of Concerta.  It has no effect on his weight he has no withdrawal symptoms.  His home life is unchanged.   Review of Systems     Objective:   Physical Exam Alert and in no distress. Tympanic membranes and canals are normal. Pharyngeal area is normal. Neck is supple without adenopathy or thyromegaly. Cardiac exam shows a regular sinus rhythm without murmurs or gallops. Lungs are clear to auscultation.        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 - Plan: methylphenidate (CONCERTA) 36 MG PO CR tablet, methylphenidate (CONCERTA) 36 MG PO CR tablet, methylphenidate (CONCERTA) 36 MG PO CR tablet  Seasonal allergic rhinitis due to pollen - Plan: CBC with Differential/Platelet, Comprehensive metabolic panel  Coronary artery disease involving native heart without angina pectoris, unspecified vessel or lesion type  - Plan: CBC with Differential/Platelet, Comprehensive metabolic panel, Lipid panel  Gastroesophageal reflux disease without esophagitis - Plan: pantoprazole (PROTONIX) 40 MG tablet  Hyperlipidemia, unspecified hyperlipidemia type - Plan: Lipid panel, atorvastatin (LIPITOR) 80 MG tablet  Essential hypertension - Plan: CBC with Differential/Platelet, Comprehensive metabolic panel  Depression with anxiety - Plan: desvenlafaxine (PRISTIQ) 50 MG 24 hr tablet, buPROPion (WELLBUTRIN XL) 300 MG 24 hr tablet  He is doing fairly well on his present dosing regimen.  I will have him stop his Symbicort.  Did recommend he use his PPI on an as-needed basis.  He is to get any further information concerning his shingles.  He will continue to be followed by [name] and Ethelene Patterson

## 2018-02-02 NOTE — Patient Instructions (Signed)
Take the pantoprazole as needed for your upset stomach

## 2018-02-03 LAB — COMPREHENSIVE METABOLIC PANEL
ALBUMIN: 4.4 g/dL (ref 3.5–4.8)
ALT: 21 IU/L (ref 0–44)
AST: 18 IU/L (ref 0–40)
Albumin/Globulin Ratio: 1.6 (ref 1.2–2.2)
Alkaline Phosphatase: 83 IU/L (ref 39–117)
BUN / CREAT RATIO: 10 (ref 10–24)
BUN: 10 mg/dL (ref 8–27)
Bilirubin Total: 0.3 mg/dL (ref 0.0–1.2)
CALCIUM: 9.3 mg/dL (ref 8.6–10.2)
CO2: 23 mmol/L (ref 20–29)
CREATININE: 0.97 mg/dL (ref 0.76–1.27)
Chloride: 104 mmol/L (ref 96–106)
GFR calc non Af Amer: 78 mL/min/{1.73_m2} (ref >59)
GFR, EST AFRICAN AMERICAN: 90 mL/min/{1.73_m2} (ref >59)
GLOBULIN, TOTAL: 2.7 g/dL (ref 1.5–4.5)
Glucose: 99 mg/dL (ref 65–99)
Potassium: 4.1 mmol/L (ref 3.5–5.2)
SODIUM: 142 mmol/L (ref 134–144)
Total Protein: 7.1 g/dL (ref 6.0–8.5)

## 2018-02-03 LAB — CBC WITH DIFFERENTIAL/PLATELET
BASOS: 1 %
Basophils Absolute: 0 10*3/uL (ref 0.0–0.2)
EOS (ABSOLUTE): 0.1 10*3/uL (ref 0.0–0.4)
EOS: 2 %
HEMATOCRIT: 40.7 % (ref 37.5–51.0)
HEMOGLOBIN: 14 g/dL (ref 13.0–17.7)
IMMATURE GRANULOCYTES: 0 %
Immature Grans (Abs): 0 10*3/uL (ref 0.0–0.1)
Lymphocytes Absolute: 1.6 10*3/uL (ref 0.7–3.1)
Lymphs: 24 %
MCH: 29.5 pg (ref 26.6–33.0)
MCHC: 34.4 g/dL (ref 31.5–35.7)
MCV: 86 fL (ref 79–97)
MONOCYTES: 8 %
Monocytes Absolute: 0.6 10*3/uL (ref 0.1–0.9)
NEUTROS PCT: 65 %
Neutrophils Absolute: 4.3 10*3/uL (ref 1.4–7.0)
Platelets: 256 10*3/uL (ref 150–450)
RBC: 4.75 x10E6/uL (ref 4.14–5.80)
RDW: 14.4 % (ref 12.3–15.4)
WBC: 6.6 10*3/uL (ref 3.4–10.8)

## 2018-02-03 LAB — LIPID PANEL
CHOL/HDL RATIO: 3.4 ratio (ref 0.0–5.0)
Cholesterol, Total: 110 mg/dL (ref 100–199)
HDL: 32 mg/dL — ABNORMAL LOW (ref >39)
LDL CALC: 44 mg/dL (ref 0–99)
TRIGLYCERIDES: 172 mg/dL — AB (ref 0–149)
VLDL Cholesterol Cal: 34 mg/dL (ref 5–40)

## 2018-02-20 ENCOUNTER — Telehealth: Payer: Self-pay | Admitting: Family Medicine

## 2018-02-20 NOTE — Telephone Encounter (Signed)
Pt came in and dropped off proof of shingrix. Sending back to [employer].

## 2018-02-23 ENCOUNTER — Encounter: Payer: Self-pay | Admitting: *Deleted

## 2018-02-23 NOTE — Telephone Encounter (Signed)
Done

## 2018-03-02 ENCOUNTER — Other Ambulatory Visit: Payer: Self-pay | Admitting: Family Medicine

## 2018-03-02 DIAGNOSIS — F418 Other specified anxiety disorders: Secondary | ICD-10-CM

## 2018-03-02 NOTE — Telephone Encounter (Signed)
Are these okay to refill? 

## 2018-04-15 ENCOUNTER — Encounter: Payer: Self-pay | Admitting: Family Medicine

## 2018-04-15 ENCOUNTER — Ambulatory Visit (INDEPENDENT_AMBULATORY_CARE_PROVIDER_SITE_OTHER): Payer: Medicare Other | Admitting: Family Medicine

## 2018-04-15 VITALS — BP 110/70 | HR 72 | Temp 97.7°F | Ht 67.0 in | Wt 170.4 lb

## 2018-04-15 DIAGNOSIS — J4530 Mild persistent asthma, uncomplicated: Secondary | ICD-10-CM | POA: Diagnosis not present

## 2018-04-15 DIAGNOSIS — R05 Cough: Secondary | ICD-10-CM | POA: Diagnosis not present

## 2018-04-15 DIAGNOSIS — K219 Gastro-esophageal reflux disease without esophagitis: Secondary | ICD-10-CM

## 2018-04-15 DIAGNOSIS — J301 Allergic rhinitis due to pollen: Secondary | ICD-10-CM

## 2018-04-15 DIAGNOSIS — R059 Cough, unspecified: Secondary | ICD-10-CM

## 2018-04-15 MED ORDER — BENZONATATE 200 MG PO CAPS: 200.0000 mg | ORAL_CAPSULE | Freq: Three times a day (TID) | ORAL | 0 refills | Status: DC | PRN

## 2018-04-15 NOTE — Patient Instructions (Signed)
Drink plenty of water. Restart taking cetirizine as there was some congestion on exam of your nose. Reflux might also contribute to your symptoms. Temporarily increase your Protonix (pantloprazole) to twice daily (for a week), and then continue it just with dinner (ideally 30 minutes prior, or as soon as you can remember, NOT at bedtime). Continue the mucinex. Use the benzonatate as needed for cough. Try using lozenges to help minimize the throat-clearing and coughing. There was no wheezing or signs of infection.

## 2018-04-15 NOTE — Progress Notes (Signed)
Chief Complaint  Patient presents with  . Chest congestion    2-3 weeks, non productive. Coughing and taking mucinex around the clock.    Started coughing 2-3 weeks ago. Cough is nonproductive.  Occasional runny nose, not significant.  Throat feels "uncomfortable", thinks related to throat-clearing and coughing. Unable to get the throat clear.  Mucinex seems to have "stopped working".  Gets very raspy in his chest and feels tight as it is wearing off.  Denies shortness of breath.  Previously saw Dr. Maple Hudson for asthma and allergies. He is supposed to be taking Anoro--thinks the powder sometimes makes him cough more.  Hasn't taken in months. Has not needed to use any rescue inhaler, denies DOE.  He has GERD. Admits to taking his protonix at bedtime, not taking prior to dinner as prescribed.  Has had ER visits for reflux in past, required GI cocktail.  Denies chest pain, dysphagia.  PMH, PSH, SH reviewed  Outpatient Encounter Medications as of 04/15/2018  Medication Sig Note  . ALPRAZolam (XANAX) 0.25 MG tablet TAKE 1 TABLET BY MOUTH TWICE A DAY AS NEEDED   . aspirin EC 81 MG tablet Take 81 mg by mouth daily.   [name] atorvastatin (LIPITOR) 80 MG tablet Take 1 tablet (80 mg total) by mouth daily.   [name] azelastine (ASTELIN) 0.1 % nasal spray Place 2 sprays into both nostrils 2 (two) times daily. Use in each nostril as directed 04/15/2018: Admits to only using 1/2 spray, he doesn't like the taste.  [name] buPROPion (WELLBUTRIN XL) 300 MG 24 hr tablet TAKE 1 TABLET BY MOUTH  DAILY   . calcium carbonate (OS-CAL) 600 MG TABS tablet Take 600 mg by mouth daily with breakfast.   . cholecalciferol (VITAMIN D) 1000 UNITS tablet Take 1,000 Units by mouth daily.   . clopidogrel (PLAVIX) 75 MG tablet Take 75 mg by mouth daily.    . Coenzyme Q10 (COQ10) 200 MG CAPS Take 1 capsule by mouth daily.   [name] desvenlafaxine (PRISTIQ) 50 MG 24 hr tablet Take 1 tablet (50 mg total) by mouth daily.   [name] diltiazem (CARDIZEM CD) 240 MG  24 hr capsule Take 240 mg by mouth daily.     . Flaxseed, Linseed, (FLAX SEED OIL) 1000 MG CAPS Take 1 capsule by mouth daily.   [name] GLUCOSAMINE CHONDROITIN COMPLX PO Take 1 tablet by mouth daily.   [name] guaiFENesin (MUCINEX) 600 MG 12 hr tablet Take 600 mg by mouth 2 (two) times daily as needed for cough.    . meclizine (ANTIVERT) 25 MG tablet Take 1 tablet by mouth  twice a day as needed for dizziness   . methylphenidate (CONCERTA) 36 MG PO CR tablet Take 1 tablet (36 mg total) by mouth daily.   . montelukast (SINGULAIR) 10 MG tablet Take 1 tablet (10 mg total) by mouth at bedtime.   . Multiple Vitamins-Minerals (MULTIVITAMIN WITH MINERALS) tablet Take 1 tablet by mouth daily.   . pantoprazole (PROTONIX) 40 MG tablet Take 1 tablet (40 mg total) by mouth daily.   [name] albuterol (VENTOLIN HFA) 108 (90 Base) MCG/ACT inhaler Inhale 2 puffs into the lungs every 4 (four) hours as needed. (Patient not taking: Reported on 04/15/2018) 04/15/2018: Hasn't been needing  . cetirizine (ZYRTEC) 10 MG tablet Take 10 mg by mouth daily.   04/15/2018: Stopped taking months ago (ran out)  . HYDROcodone-acetaminophen (NORCO) 10-325 MG tablet Take 1 tablet by mouth every 6 (six) hours as needed. (Patient not taking: Reported on 04/15/2018)   .  methocarbamol (ROBAXIN) 500 MG tablet Take 1 tablet (500 mg total) by mouth 3 (three) times daily as needed for muscle spasms. (Patient not taking: Reported on 02/02/2018)   . nitroGLYCERIN (NITROSTAT) 0.4 MG SL tablet Place 0.4 mg under the tongue every 5 (five) minutes as needed.    . umeclidinium-vilanterol (ANORO ELLIPTA) 62.5-25 MCG/INH AEPB Inhale 1 puff into the lungs daily. (Patient not taking: Reported on 02/02/2018)   . [DISCONTINUED] desvenlafaxine (PRISTIQ) 50 MG 24 hr tablet TAKE 1 TABLET BY MOUTH  DAILY   . [DISCONTINUED] methylphenidate (CONCERTA) 36 MG PO CR tablet Take 1 tablet (36 mg total) by mouth daily.   . [DISCONTINUED] methylphenidate (CONCERTA) 36 MG PO CR tablet Take  1 tablet (36 mg total) by mouth daily.   . [DISCONTINUED] QUERCETIN PO Take 500 mg elemental calcium/kg/hr by mouth daily.   . [DISCONTINUED] umeclidinium-vilanterol (ANORO ELLIPTA) 62.5-25 MCG/INH AEPB Inhale 1 puff into the lungs daily. (Patient not taking: Reported on 02/02/2018)    No facility-administered encounter medications on file as of 04/15/2018.    Allergies  Allergen Reactions  . Oxycodone Nausea And Vomiting    ROS:  No fever, chills, nausea, vomiting, diarrhea. No sick contacts. Sciatica--mainly on left; recently having some spasm on the right hip as well. + cough, chest pressure, no dyspnea, per HPI.  +allergies. No bleeding, rashes.   PHYSICAL EXAM:  BP 110/70   Pulse 72   Temp 97.7 F (36.5 C) (Tympanic)   Ht 5\' 7"  (1.702 m)   Wt 170 lb 6.4 oz (77.3 kg)   BMI 26.69 kg/m   Pleasant, talkative, well-appearing male, head tilted to the left, in no distress. Frequent throat-clearing during visit, no cough. HEENT: PERRL, EOMI, conjunctiva and sclera are clear.  Nasal mucosa is mildly edematous, L>R, no erythema or purulence.  TM's and EAC's normal. OP clear. sinuse snontender Neck: no lymphadenopathy or mass Heart: regular rate and rhythm Lungs: clear bilaterally, no wheezes, rales, ronchi; good air movement Abdomen: very mild epigastric tenderness.  No organomegaly or mass Neuro: alert and oriented, cranial nerves intact Psych: normal mood, affect, hygiene and grooming  ASSESSMENT/PLAN:  Seasonal allergic rhinitis due to pollen - restart cetirizine daily, continue singulair  Gastroesophageal reflux disease without esophagitis - increase PPI to BID for a week (once better, can back down to once daily, taking before or with dinner, not at bedtime)  Mild persistent chronic asthma without complication - non compliant with meds, but doesn't appear to be the cause of his sx today.  Cough - and throat-clearing; Ddx includes allergies with PND and GERD, will treat for  both. Use Tessalon prn, as well as lozenges; try and avoid throat-clearing - Plan: benzonatate (TESSALON) 200 MG capsule    Drink plenty of water. Restart taking cetirizine as there was some congestion on exam of your nose. Reflux might also contribute to your symptoms. Temporarily increase your Protonix (pantloprazole) to twice daily (for a week), and then continue it just with dinner (ideally 30 minutes prior, or as soon as you can remember, NOT at bedtime). Continue the mucinex. Use the benzonatate as needed for cough. Try using lozenges to help minimize the throat-clearing and coughing. There was no wheezing or signs of infection.

## 2018-04-23 ENCOUNTER — Encounter: Payer: Self-pay | Admitting: Family Medicine

## 2018-04-23 ENCOUNTER — Ambulatory Visit (INDEPENDENT_AMBULATORY_CARE_PROVIDER_SITE_OTHER): Payer: Medicare Other | Admitting: Family Medicine

## 2018-04-23 VITALS — BP 120/84 | HR 76 | Temp 97.6°F | Ht 65.75 in | Wt 167.2 lb

## 2018-04-23 DIAGNOSIS — J301 Allergic rhinitis due to pollen: Secondary | ICD-10-CM

## 2018-04-23 DIAGNOSIS — Z0000 Encounter for general adult medical examination without abnormal findings: Secondary | ICD-10-CM | POA: Diagnosis not present

## 2018-04-23 DIAGNOSIS — I2510 Atherosclerotic heart disease of native coronary artery without angina pectoris: Secondary | ICD-10-CM

## 2018-04-23 DIAGNOSIS — F909 Attention-deficit hyperactivity disorder, unspecified type: Secondary | ICD-10-CM

## 2018-04-23 DIAGNOSIS — E785 Hyperlipidemia, unspecified: Secondary | ICD-10-CM

## 2018-04-23 DIAGNOSIS — M1990 Unspecified osteoarthritis, unspecified site: Secondary | ICD-10-CM

## 2018-04-23 DIAGNOSIS — H9113 Presbycusis, bilateral: Secondary | ICD-10-CM

## 2018-04-23 DIAGNOSIS — G8929 Other chronic pain: Secondary | ICD-10-CM

## 2018-04-23 DIAGNOSIS — M519 Unspecified thoracic, thoracolumbar and lumbosacral intervertebral disc disorder: Secondary | ICD-10-CM

## 2018-04-23 DIAGNOSIS — I10 Essential (primary) hypertension: Secondary | ICD-10-CM

## 2018-04-23 DIAGNOSIS — J4520 Mild intermittent asthma, uncomplicated: Secondary | ICD-10-CM

## 2018-04-23 HISTORY — DX: Presbycusis, bilateral: H91.13

## 2018-04-23 LAB — POCT URINALYSIS DIP (PROADVANTAGE DEVICE)
BILIRUBIN UA: NEGATIVE mg/dL
Bilirubin, UA: NEGATIVE
Glucose, UA: NEGATIVE mg/dL
Leukocytes, UA: NEGATIVE
Nitrite, UA: NEGATIVE
PH UA: 6 (ref 5.0–8.0)
Protein Ur, POC: NEGATIVE mg/dL
RBC UA: NEGATIVE
SPECIFIC GRAVITY, URINE: 1.015
Urobilinogen, Ur: 3.5

## 2018-04-23 MED ORDER — ALBUTEROL SULFATE HFA 108 (90 BASE) MCG/ACT IN AERS: 2.0000 | INHALATION_SPRAY | RESPIRATORY_TRACT | 2 refills | Status: DC | PRN

## 2018-04-23 NOTE — Progress Notes (Signed)
[name] is a 72 y.o. male who presents for annual wellness visit,CPE and follow-up on chronic medical conditions.  He does have underlying heart disease but presently is having no chest pain, shortness of breath, PND.  He is followed regularly by cardiology.  He continues on diltiazem.  He does have nitroglycerin but has not used that in quite some time.  There is a question in the record of seizure disorder and convulsions however he has never had any problems like that.  He has no COPD but does have asthma and allergies.  He is on appropriate medications for that.  He has been using Anoro Ellipta on an as-needed basis and has not used albuterol.  Is also been having a lot of difficulty with back pain as well as radiation of the pain down his right leg.  He is followed by Dr. Shon BatonBrooks for that.  Dr. Shon BatonBrooks is providing him with hydrocodone.  He presently has a stimulator for that.  He dermatology regularly.  He does have reflux disease and presently is on pantoprazole twice per day to help with symptoms.  He has had difficulty in the last 3 months with getting and staying asleep there is nothing new going on his life and has had no new medications.  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  Immunizations and Health Maintenance Immunization History  Administered Date(s) Administered  . Influenza Split 04/23/2011  . Influenza Whole 03/20/2009, 03/19/2010  . Influenza, High Dose Seasonal PF 04/12/2016, 02/26/2017, 04/02/2018  . Influenza,inj,Quad PF,6+ Mos 03/05/2013, 02/23/2014, 04/21/2015  . Influenza-Unspecified 04/02/2018  . Pneumococcal Conjugate-13 03/04/2014  . Pneumococcal Polysaccharide-23 06/10/2005, 05/19/2012  . Td 10/20/2006  . Tdap 10/20/2006, 02/26/2017  . Zoster 01/01/2007  . Zoster Recombinat (Shingrix) 12/10/2016, 07/15/2017   There are no preventive care reminders to display for this patient.  Last  colonoscopy:10-20-13 Last PSA:07-23-13 Dentist: 03/2018 Ophtho:04/2018 Exercise: gym 3x a week, bike  Other doctors caring for patient include:Tilley ;Shon BatonBrooks;Young  Advanced Directives: Does Patient Have a Programmer, multimediaMedical Advance Directive?: Yes Type of Advance Directive: Healthcare Power of Attorney, Living will Does patient want to make changes to medical advance directive?: No - Patient declined Copy of Healthcare Power of Attorney in Chart?: Yes - validated most recent copy scanned in chart (See row information)  Depression screen:  See questionnaire below.     Depression [facility] 04/23/2018 02/02/2018 10/15/2016 12/13/2015 10/10/2015  Decreased Interest 0 0 0 0 0  Down, Depressed, Hopeless 0 0 0 0 0  PHQ - 2 Score 0 0 0 0 0  Some recent data might be hidden    Fall Screen: See Questionaire below.   Fall Risk  04/23/2018 02/02/2018 10/15/2016 12/13/2015 10/10/2015  Falls in the past year? 0 No No No No    ADL screen:  See questionnaire below.  Functional Status Survey: Is the patient deaf or have difficulty hearing?: Yes Does the patient have difficulty seeing, even when wearing glasses/contacts?: No Does the patient have difficulty concentrating, remembering, or making decisions?: No Does the patient have difficulty walking or climbing stairs?: No Does the patient have difficulty dressing or bathing?: No Does the patient have difficulty doing errands alone such as visiting a doctor's office or shopping?: No   Review of Systems  Constitutional: -, -unexpected weight change, -anorexia, -fatigue Allergy: -sneezing, -itching, -congestion Dermatology: denies changing moles, rash, lumps ENT: -runny nose, -ear pain, -sore throat,  Cardiology:  -chest pain, -palpitations, -orthopnea, Respiratory: -cough, -shortness of breath, -dyspnea on exertion, -wheezing,  Gastroenterology: -abdominal pain, -nausea, -vomiting, -diarrhea, -constipation, -dysphagia Hematology: -bleeding or bruising  problems Musculoskeletal: -arthralgias, -myalgias, -joint swelling, -back pain, - Ophthalmology: -vision changes,  Urology: -dysuria, -difficulty urinating,  -urinary frequency, -urgency, incontinence Neurology: -, -numbness, , -memory loss, -falls, -dizziness    PHYSICAL EXAM:  BP 120/84 (BP Location: Left Arm, Patient Position: Sitting)   Pulse 76   Temp 97.6 F (36.4 C)   Ht 5' 5.75" (1.67 m)   Wt 167 lb 3.2 oz (75.8 kg)   SpO2 97%   BMI 27.19 kg/m   General Appearance: Alert, cooperative, no distress, appears stated age Head: Normocephalic, without obvious abnormality, atraumatic Eyes: PERRL, conjunctiva/corneas clear, EOM's intact, fundi benign Ears: Normal TM's and external ear canals Nose: Nares normal, mucosa normal, no drainage or sinus   tenderness Throat: Lips, mucosa, and tongue normal; teeth and gums normal Neck: Supple, no lymphadenopathy, thyroid:no enlargement/tenderness/nodules; no carotid bruit or JVD Lungs: Clear to auscultation bilaterally without wheezes, rales or ronchi; respirations unlabored Heart: Regular rate and rhythm, S1 and S2 normal, no murmur, rub or gallop Abdomen: Soft, non-tender, nondistended, normoactive bowel sounds, no masses, no hepatosplenomegaly Extremities: No clubbing, cyanosis or edema Pulses: 2+ and symmetric all extremities Skin: Skin color, texture, turgor normal, no rashes or lesions Lymph nodes: Cervical, supraclavicular, and axillary nodes normal Neurologic: CNII-XII intact, normal strength, sensation and gait; reflexes 2+ and symmetric throughout   Psych: Normal mood, affect, hygiene and grooming  ASSESSMENT/PLAN: Routine general medical examination at a health care facility - Plan: POCT Urinalysis DIP (Proadvantage Device)  Seasonal allergic rhinitis due to pollen  Arthritis  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  Discussed proper use of that.  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  He will continue on his other medications.  Recommend he follow-up with Dr. Shon Baton concerning his continued difficulty with back and leg pain.     Immunization recommendations discussed.  Colonoscopy recommendations reviewed.   Medicare Attestation I have personally reviewed: The patient's medical and social history Their use of alcohol, tobacco or illicit drugs Their current medications and supplements The patient's functional ability including ADLs,fall risks, home safety risks, cognitive, and hearing and visual impairment Diet and physical activities Evidence for depression or mood disorders  The patient's weight, height, and BMI have been recorded in the chart.  I have made referrals, counseling, and provided education to the patient based on review of the above and I have provided the patient with a written personalized care plan for preventive services.

## 2018-04-23 NOTE — Patient Instructions (Signed)

## 2018-04-24 LAB — COMPREHENSIVE METABOLIC PANEL
ALT: 23 IU/L (ref 0–44)
AST: 20 IU/L (ref 0–40)
Albumin/Globulin Ratio: 1.5 (ref 1.2–2.2)
Albumin: 4.6 g/dL (ref 3.5–4.8)
Alkaline Phosphatase: 91 IU/L (ref 39–117)
BUN/Creatinine Ratio: 13 (ref 10–24)
BUN: 12 mg/dL (ref 8–27)
Bilirubin Total: 0.3 mg/dL (ref 0.0–1.2)
CHLORIDE: 103 mmol/L (ref 96–106)
CO2: 23 mmol/L (ref 20–29)
CREATININE: 0.94 mg/dL (ref 0.76–1.27)
Calcium: 9.7 mg/dL (ref 8.6–10.2)
GFR, EST AFRICAN AMERICAN: 93 mL/min/{1.73_m2} (ref >59)
GFR, EST NON AFRICAN AMERICAN: 81 mL/min/{1.73_m2} (ref >59)
GLUCOSE: 90 mg/dL (ref 65–99)
Globulin, Total: 3 g/dL (ref 1.5–4.5)
Potassium: 4.3 mmol/L (ref 3.5–5.2)
Sodium: 144 mmol/L (ref 134–144)
TOTAL PROTEIN: 7.6 g/dL (ref 6.0–8.5)

## 2018-04-24 LAB — CBC WITH DIFFERENTIAL/PLATELET
BASOS: 1 %
Basophils Absolute: 0.1 10*3/uL (ref 0.0–0.2)
EOS (ABSOLUTE): 0.2 10*3/uL (ref 0.0–0.4)
EOS: 2 %
HEMATOCRIT: 41.8 % (ref 37.5–51.0)
Hemoglobin: 14.3 g/dL (ref 13.0–17.7)
IMMATURE GRANULOCYTES: 0 %
Immature Grans (Abs): 0 10*3/uL (ref 0.0–0.1)
LYMPHS: 33 %
Lymphocytes Absolute: 2.5 10*3/uL (ref 0.7–3.1)
MCH: 29.4 pg (ref 26.6–33.0)
MCHC: 34.2 g/dL (ref 31.5–35.7)
MCV: 86 fL (ref 79–97)
Monocytes Absolute: 0.6 10*3/uL (ref 0.1–0.9)
Monocytes: 8 %
NEUTROS PCT: 56 %
Neutrophils Absolute: 4.3 10*3/uL (ref 1.4–7.0)
PLATELETS: 294 10*3/uL (ref 150–450)
RBC: 4.87 x10E6/uL (ref 4.14–5.80)
RDW: 13.6 % (ref 12.3–15.4)
WBC: 7.7 10*3/uL (ref 3.4–10.8)

## 2018-04-24 LAB — LIPID PANEL
CHOL/HDL RATIO: 3.1 ratio (ref 0.0–5.0)
Cholesterol, Total: 127 mg/dL (ref 100–199)
HDL: 41 mg/dL (ref >39)
LDL CALC: 63 mg/dL (ref 0–99)
Triglycerides: 113 mg/dL (ref 0–149)
VLDL CHOLESTEROL CAL: 23 mg/dL (ref 5–40)

## 2018-06-05 ENCOUNTER — Telehealth: Payer: Self-pay | Admitting: Cardiology

## 2018-06-05 MED ORDER — CLOPIDOGREL BISULFATE 75 MG PO TABS: 75.0000 mg | ORAL_TABLET | Freq: Every day | ORAL | 0 refills | Status: DC

## 2018-06-05 NOTE — Telephone Encounter (Signed)
° ° ° °  1. Which medications need to be refilled? (please list name of each medication and dose if known) Clopidogrel 75mg  tablet once daily  2. Which pharmacy/location (including street and city if local pharmacy) is medication to be sent to?[facility]  3. Do they need a 30 day or 90 day supply? 90

## 2018-06-05 NOTE — Telephone Encounter (Signed)
Refill for clopidogrel has been sent to [facility]. Patient is due for follow up in February 2020. Will have front desk contact patient to schedule an appointment.

## 2018-06-10 ENCOUNTER — Other Ambulatory Visit: Payer: Self-pay | Admitting: Family Medicine

## 2018-07-01 ENCOUNTER — Telehealth: Payer: Self-pay | Admitting: Family Medicine

## 2018-07-01 DIAGNOSIS — F909 Attention-deficit hyperactivity disorder, unspecified type: Secondary | ICD-10-CM

## 2018-07-01 MED ORDER — METHYLPHENIDATE HCL ER (OSM) 36 MG PO TBCR: 36.0000 mg | EXTENDED_RELEASE_TABLET | Freq: Every day | ORAL | 0 refills | Status: DC

## 2018-07-01 NOTE — Telephone Encounter (Signed)
Pt left message needs refill Concerta

## 2018-07-03 ENCOUNTER — Telehealth: Payer: Self-pay | Admitting: Family Medicine

## 2018-07-03 DIAGNOSIS — K219 Gastro-esophageal reflux disease without esophagitis: Secondary | ICD-10-CM

## 2018-07-03 DIAGNOSIS — F909 Attention-deficit hyperactivity disorder, unspecified type: Secondary | ICD-10-CM

## 2018-07-03 MED ORDER — PANTOPRAZOLE SODIUM 40 MG PO TBEC: 40.0000 mg | DELAYED_RELEASE_TABLET | Freq: Every day | ORAL | 3 refills | Status: DC

## 2018-07-03 NOTE — Telephone Encounter (Signed)
Pt left voicemail needing refills on Concerta and Pantoprazole.

## 2018-07-03 NOTE — Telephone Encounter (Signed)
Pt was called LVM due to no answer. KH

## 2018-07-03 NOTE — Telephone Encounter (Signed)
Let him know that the conservator was already called him

## 2018-07-04 NOTE — Telephone Encounter (Signed)
refilled 

## 2018-07-06 ENCOUNTER — Other Ambulatory Visit: Payer: Self-pay | Admitting: Family Medicine

## 2018-07-06 DIAGNOSIS — R05 Cough: Secondary | ICD-10-CM

## 2018-07-06 DIAGNOSIS — R059 Cough, unspecified: Secondary | ICD-10-CM

## 2018-07-07 NOTE — Telephone Encounter (Signed)
[facility] is requesting to fill pt Tessalon . [facility]

## 2018-07-21 ENCOUNTER — Telehealth: Payer: Self-pay | Admitting: Family Medicine

## 2018-07-21 NOTE — Telephone Encounter (Signed)
Pt called and stated his current cardio doc, Dr. Donnie Aho is not longer practicing. He would like a recommendation from you for a new doc. Please advise pt at [phone].

## 2018-07-21 NOTE — Telephone Encounter (Signed)
Pt was called and advised [facility] 

## 2018-07-21 NOTE — Telephone Encounter (Signed)
He can see any of the [facility] cardiologists

## 2018-08-11 ENCOUNTER — Other Ambulatory Visit: Payer: Self-pay | Admitting: Family Medicine

## 2018-08-11 DIAGNOSIS — J4530 Mild persistent asthma, uncomplicated: Secondary | ICD-10-CM

## 2018-08-11 DIAGNOSIS — J301 Allergic rhinitis due to pollen: Secondary | ICD-10-CM

## 2018-08-25 ENCOUNTER — Other Ambulatory Visit: Payer: Self-pay | Admitting: Cardiology

## 2018-08-25 DIAGNOSIS — I2510 Atherosclerotic heart disease of native coronary artery without angina pectoris: Secondary | ICD-10-CM

## 2018-08-28 ENCOUNTER — Other Ambulatory Visit: Payer: Self-pay

## 2018-08-28 ENCOUNTER — Telehealth: Payer: Self-pay | Admitting: Cardiology

## 2018-08-28 DIAGNOSIS — E785 Hyperlipidemia, unspecified: Secondary | ICD-10-CM

## 2018-08-28 MED ORDER — ATORVASTATIN CALCIUM 80 MG PO TABS: 80.0000 mg | ORAL_TABLET | Freq: Every day | ORAL | 3 refills | Status: DC

## 2018-08-28 NOTE — Telephone Encounter (Signed)
°*  STAT* If patient is at the pharmacy, call can be transferred to refill team.   1. Which medications need to be refilled? (please list name of each medication and dose if known) Atorvastatin tablets 80mg  once daily  2. Which pharmacy/location (including street and city if local pharmacy) is medication to be sent to?[facility]  3. Do they need a 30 day or 90 day supply? 90

## 2018-09-01 ENCOUNTER — Ambulatory Visit: Payer: Medicare Other | Admitting: Internal Medicine

## 2018-09-04 ENCOUNTER — Other Ambulatory Visit: Payer: Self-pay | Admitting: Family Medicine

## 2018-09-04 DIAGNOSIS — J4520 Mild intermittent asthma, uncomplicated: Secondary | ICD-10-CM

## 2018-09-04 NOTE — Telephone Encounter (Signed)
Is this ok to refill?  

## 2018-09-06 NOTE — Telephone Encounter (Signed)
Forwarding to you, refill request

## 2018-09-14 IMAGING — CT CT MAXILLOFACIAL W/O CM
2 of 3 series · 15 of 37 positions shown, 18 images · non-contrast
Comparison: None.

CLINICAL DATA: Acute recurrent frontal sinusitis. No relief after 3
rounds of antibiotics. Cough for 3 weeks. Surgery for deviated nasal
septum in 4165.

EXAM:
CT MAXILLOFACIAL WITHOUT CONTRAST
TECHNIQUE: Multidetector CT imaging of the maxillofacial structures was
performed. Multiplanar CT image reconstructions were also generated.
A small metallic BB was placed on the right temple in order to
reliably differentiate right from left.

[Series 601: coronal facial · coronal · 0.33mm/px · 3 of 80 slices shown]
[im 27/80  bone]
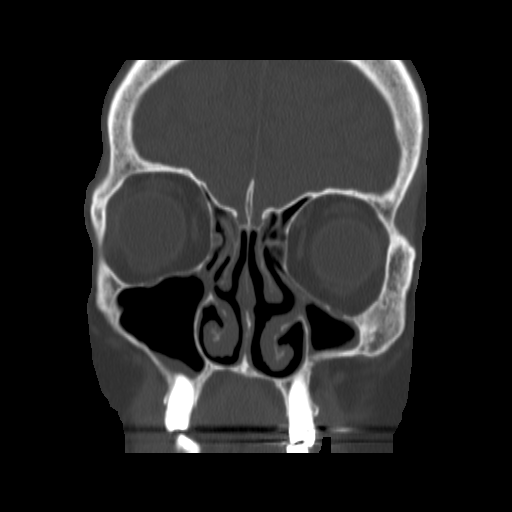
[im 40/80  bone]
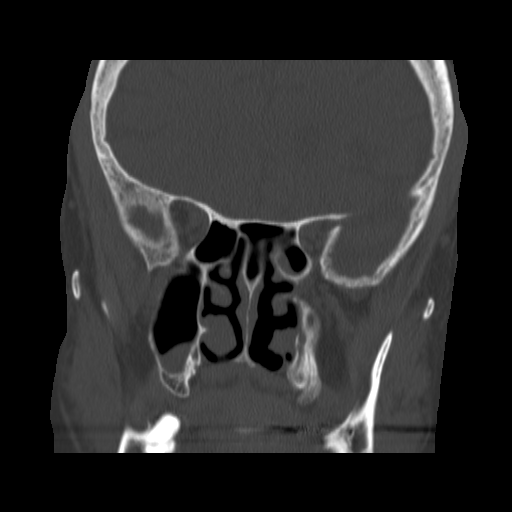
[im 53/80  bone]
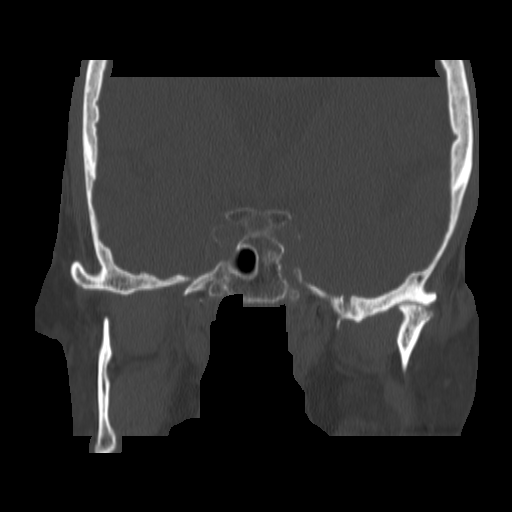

[Series 602: sagittal facial · sagittal · 0.33mm/px · 12 of 84 slices shown, 15 images]
[im 5/84  brain]
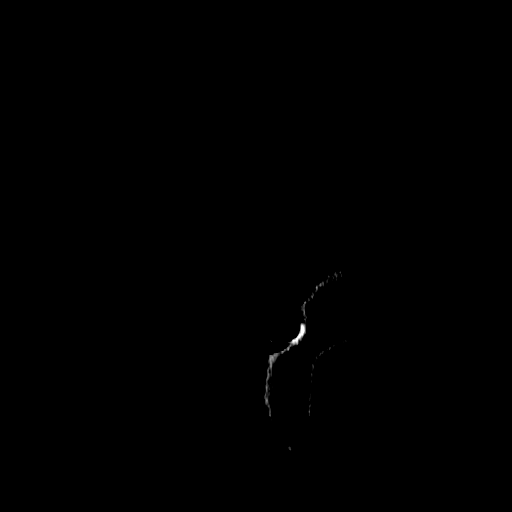
[im 5/84  bone]
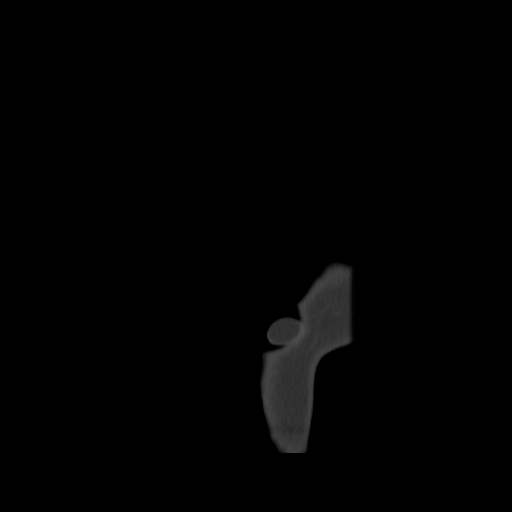
[im 15/84  bone]
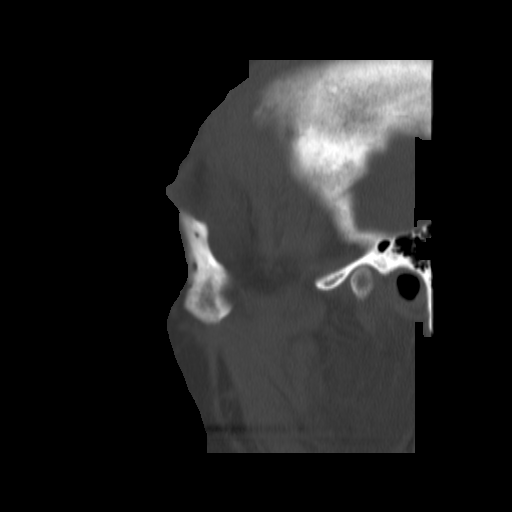
[im 20/84  bone]
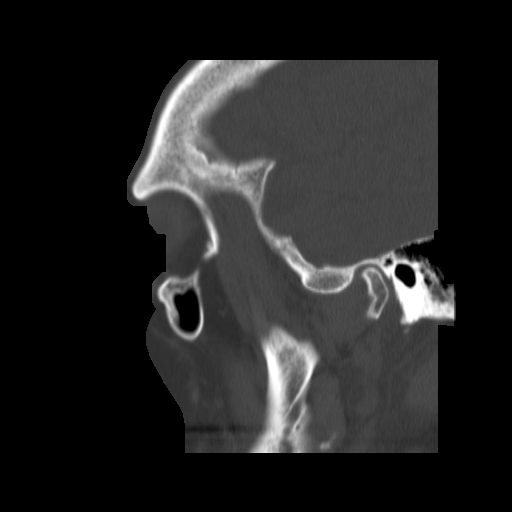
[im 25/84  bone]
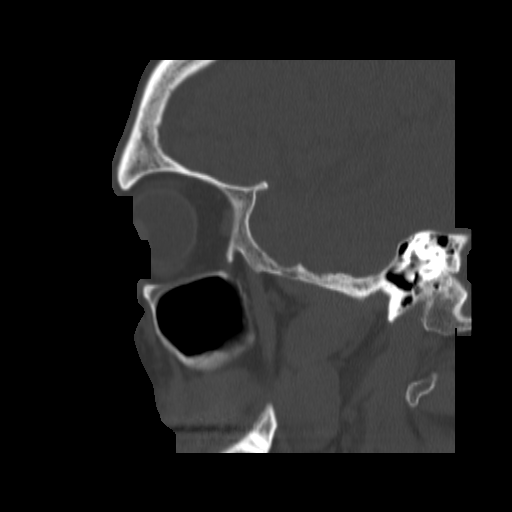
[im 35/84  brain]
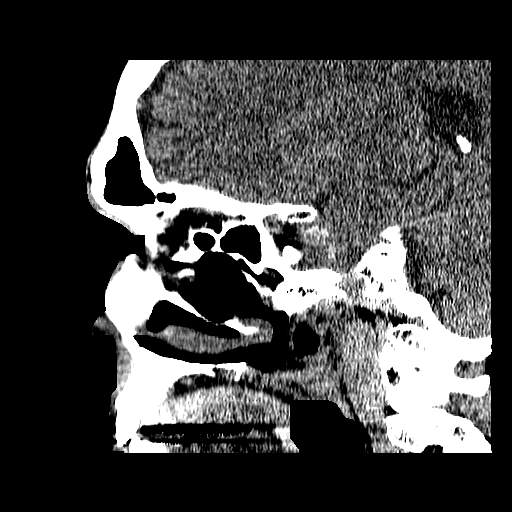
[im 35/84  bone]
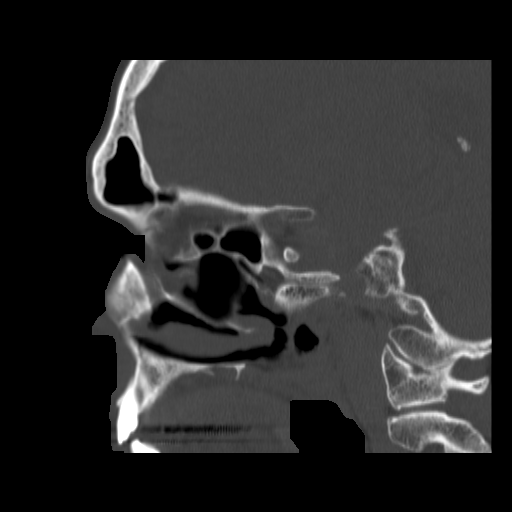
[im 40/84  bone]
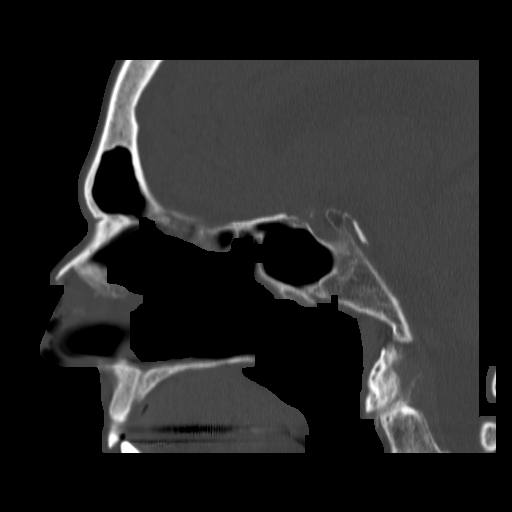
[im 44/84  bone]
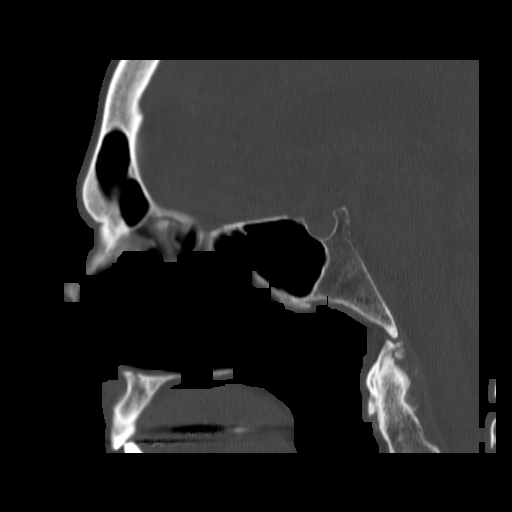
[im 54/84  bone]
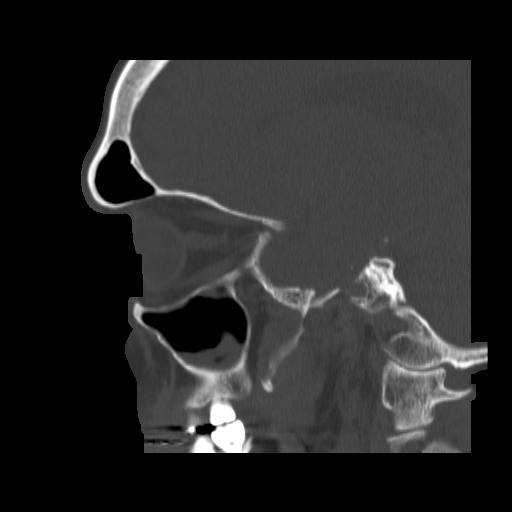
[im 59/84  brain]
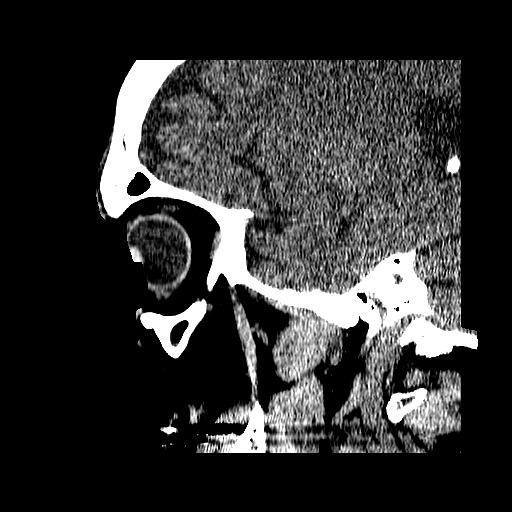
[im 59/84  bone]
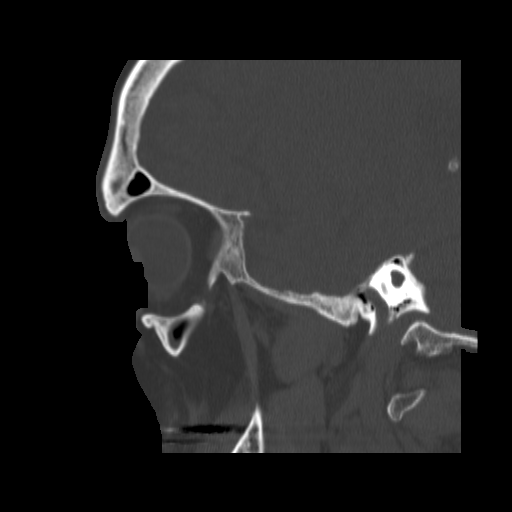
[im 64/84  bone]
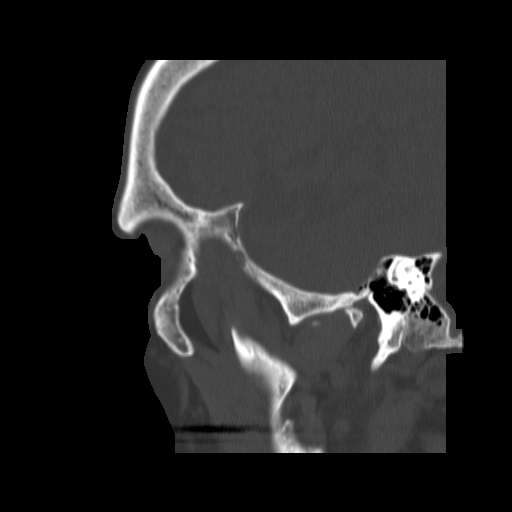
[im 74/84  bone]
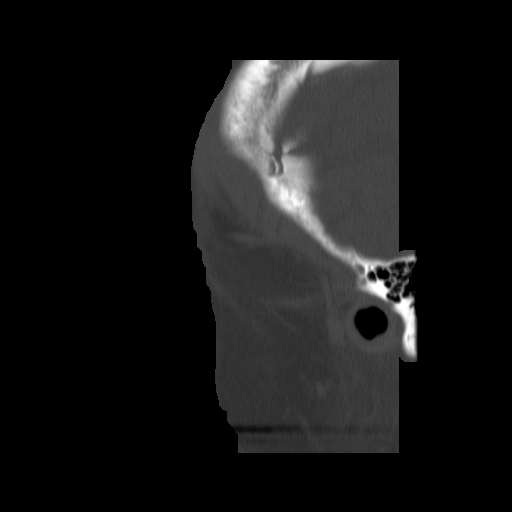
[im 79/84  bone]
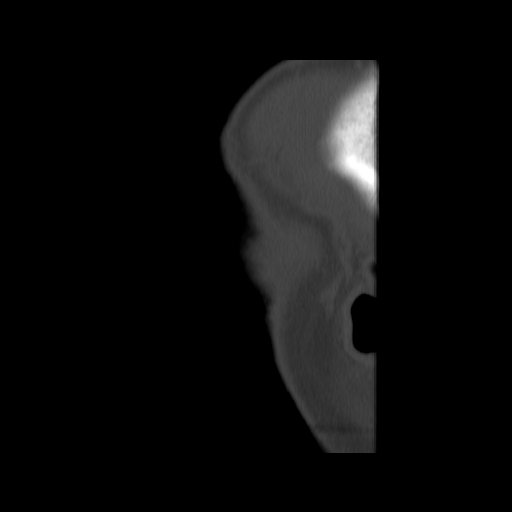

[15 of 37 positions shown; findings below may reference images not displayed]

FINDINGS: Osseous: No lytic or blastic lesions are present. No acute or
healing fractures are present.

Orbits: The globes and orbits are within normal limits.

Sinuses: The left tonsillar process is opposed to the lamina
papyracea with partial obstruction of the ostiomeatal complex. There
is soft tissue obstructing the ostiomeatal complex on the right. The
left maxillary sinus is shrunken with circumferential mucosal
thickening. This suggests a history of chronic disease. Mucosal
thickening is noted along the floor of the right maxillary sinus.
Mild mucosal thickening is present within the anterior ethmoid air
cells. There is focal mucosal thickening lateral to the
sphenoethmoid recess on the right. A small polyp or mucous retention
cyst is present in the left sphenoid sinus. The sphenoid sinuses are
otherwise clear.

The cribriform plate is within normal limits bilaterally. The
lateral lamella measures 6 mm, within normal limits. There is normal
pneumatization of the frontal sinuses along the orbital roof and the
sphenoid sinuses posteriorly.

Soft tissues: The soft tissues the face are unremarkable. There is
irregularity along a mucosa of the inferior turbinates bilaterally,
suggesting small polyps. The nasal cavity is clear. Nasal septum is
midline.

Limited intracranial: Mild generalized atrophy and white matter
disease is present. No focal lesions are evident.
IMPRESSION: 1. Lateral positioning of the onset process with chronic obstruction
of the ostiomeatal complex. A shrunken left maxillary sinus supports
a history of chronic disease.
2. Moderate mucosal thickening in the left maxillary sinus is most
prominent in item mild mucosal thickening inferiorly in the right
maxillary sinus with soft tissue obstructing the ostiomeatal
complex.
3. Irregularity of the mucosa in the nasal cavity bilaterally
suggesting small nasal polyps.
4. Mild mucosal thickening throughout the ethmoid air cells and
inferior frontal sinuses.
5. Minimal anterior sphenoid sinus disease as described.
6. Mild cerebral atrophy and white matter disease likely reflects
the sequela of chronic microvascular ischemia.

## 2018-09-20 IMAGING — CR DG CHEST 2V
2 series · 2 of 2 positions shown · non-contrast
Comparison: 10/06/2012

CLINICAL DATA: Dry cough for 3 weeks.  Preop lumbar surgery

EXAM:
CHEST  2 VIEW

[w chest pa]
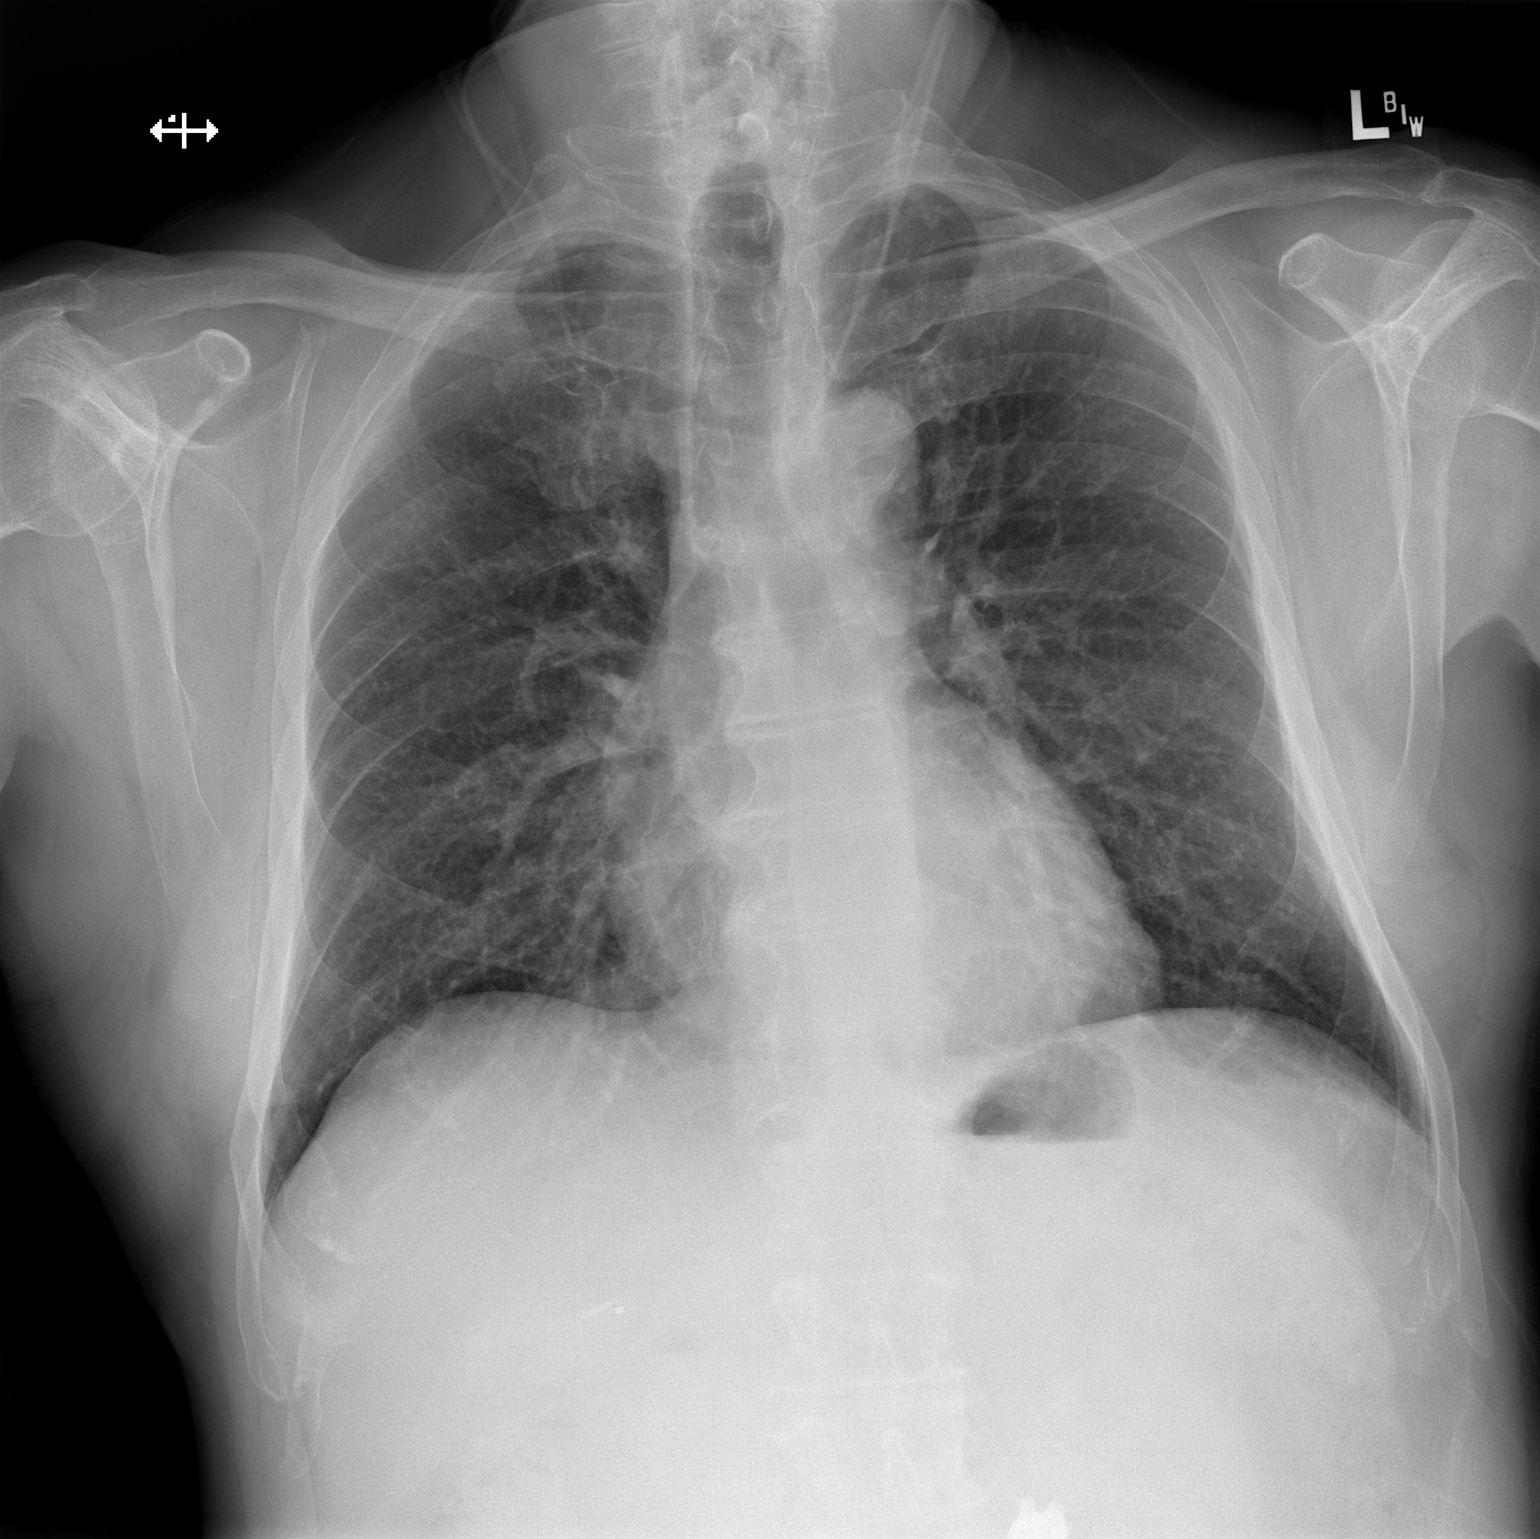

[w chest lat]
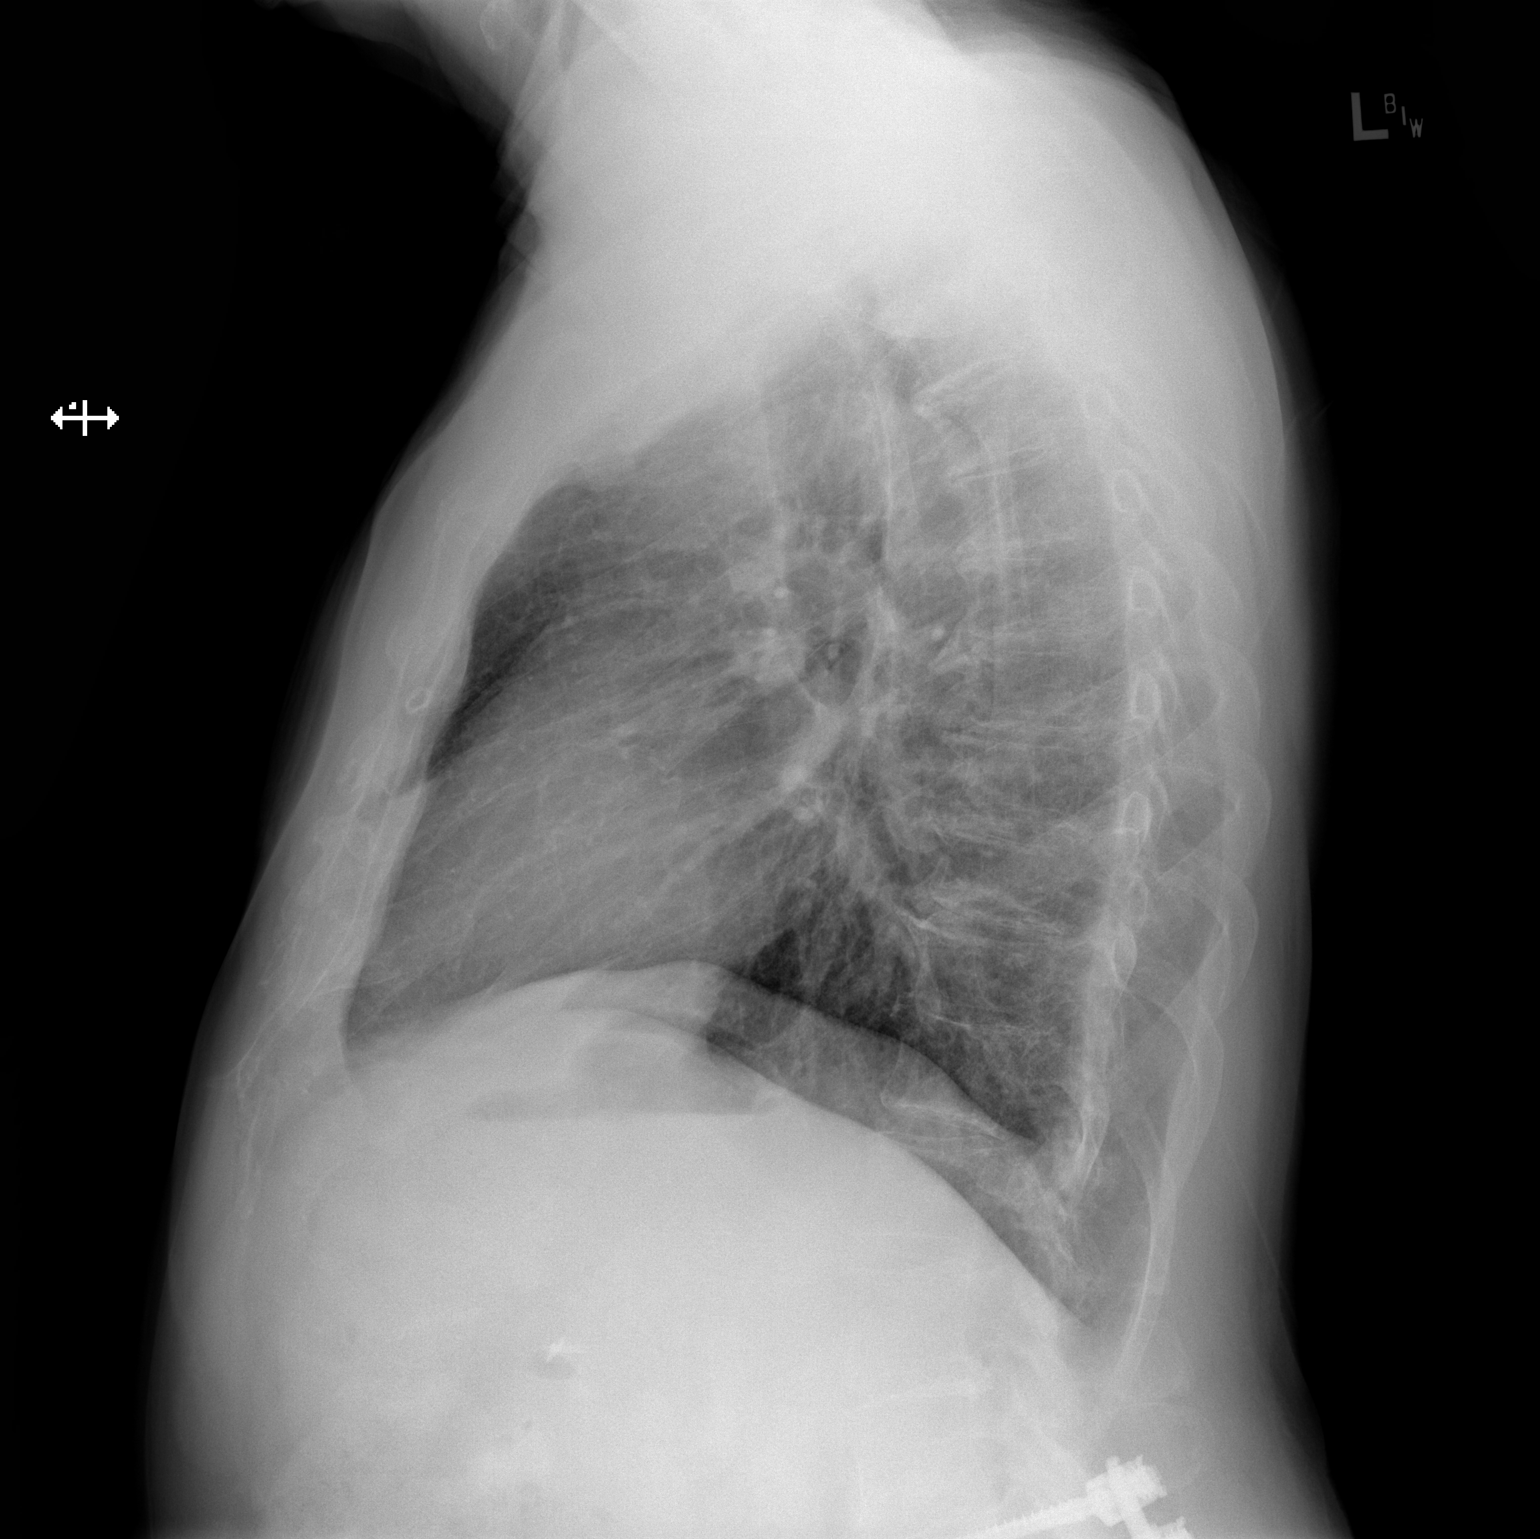

[2 of 2 positions shown; findings below may reference images not displayed]

FINDINGS: Heart and mediastinal contours are within normal limits. No focal
opacities or effusions. No acute bony abnormality.
IMPRESSION: No active cardiopulmonary disease.

## 2018-10-08 ENCOUNTER — Other Ambulatory Visit: Payer: Self-pay | Admitting: Cardiology

## 2018-10-08 ENCOUNTER — Telehealth: Payer: Self-pay | Admitting: Cardiology

## 2018-10-08 MED ORDER — DILTIAZEM HCL ER COATED BEADS 240 MG PO CP24: 240.0000 mg | ORAL_CAPSULE | Freq: Every day | ORAL | 0 refills | Status: DC

## 2018-10-08 NOTE — Telephone Encounter (Signed)
°*  STAT* If patient is at the pharmacy, call can be transferred to refill team.   1. Which medications need to be refilled? (please list name of each medication and dose if known) diltiazem (CARDIZEM CD) 240 MG 24    2. Which pharmacy/location (including street and city if local pharmacy) is medication to be sent to?  [facility] - [place], [place] - [address] [phone] (Phone) [phone] (Fax)    3. Do they need a 30 day or 90 day supply? 90 day

## 2018-10-08 NOTE — Telephone Encounter (Signed)
Virtual Visit Pre-Appointment Phone Call  "(Name), I am calling you today to discuss your upcoming appointment. We are currently trying to limit exposure to the virus that causes COVID-19 by seeing patients at home rather than in the office."  1. "What is the BEST phone number to call the day of the visit?" - include this in appointment notes  2. Do you have or have access to (through a family member/friend) a smartphone with video capability that we can use for your visit?" a. If yes - list this number in appt notes as cell (if different from BEST phone #) and list the appointment type as a VIDEO visit in appointment notes b. If no - list the appointment type as a PHONE visit in appointment notes  3. Confirm consent - "In the setting of the current Covid19 crisis, you are scheduled for a (phone or video) visit with your provider on (date) at (time).  Just as we do with many in-office visits, in order for you to participate in this visit, we must obtain consent.  If you'd like, I can send this to your mychart (if signed up) or email for you to review.  Otherwise, I can obtain your verbal consent now.  All virtual visits are billed to your insurance company just like a normal visit would be.  By agreeing to a virtual visit, we'd like you to understand that the technology does not allow for your provider to perform an examination, and thus may limit your provider's ability to fully assess your condition. If your provider identifies any concerns that need to be evaluated in person, we will make arrangements to do so.  Finally, though the technology is pretty good, we cannot assure that it will always work on either your or our end, and in the setting of a video visit, we may have to convert it to a phone-only visit.  In either situation, we cannot ensure that we have a secure connection.  Are you willing to proceed?" STAFF: Did the patient verbally acknowledge consent to telehealth visit? Document  YES/NO here: Yes  4. Advise patient to be prepared - "Two hours prior to your appointment, go ahead and check your blood pressure, pulse, oxygen saturation, and your weight (if you have the equipment to check those) and write them all down. When your visit starts, your provider will ask you for this information. If you have an Apple Watch or Kardia device, please plan to have heart rate information ready on the day of your appointment. Please have a pen and paper handy nearby the day of the visit as well."  5. Give patient instructions for MyChart download to smartphone OR [email or web address] as below if video visit (depending on what platform provider is using)  6. Inform patient they will receive a phone call 15 minutes prior to their appointment time (may be from unknown caller ID) so they should be prepared to answer    TELEPHONE CALL NOTE  [name] has been deemed a candidate for a follow-up tele-health visit to limit community exposure during the Covid-19 pandemic. I spoke with the patient via phone to ensure availability of phone/video source, confirm preferred email & phone number, and discuss instructions and expectations.  I reminded [name] to be prepared with any vital sign and/or heart rhythm information that could potentially be obtained via home monitoring, at the time of his visit. I reminded [name] to expect a phone call prior to  his visit.  [name] 10/08/2018 1:54 PM     FULL LENGTH CONSENT FOR TELE-HEALTH VISIT   I hereby voluntarily request, consent and authorize [facility] and its employed or contracted physicians, physician assistants, nurse practitioners or other licensed health care professionals (the Practitioner), to provide me with telemedicine health care services (the Services") as deemed necessary by the treating Practitioner. I acknowledge and consent to receive the Services by the Practitioner via telemedicine. I  understand that the telemedicine visit will involve communicating with the Practitioner through live audiovisual communication technology and the disclosure of certain medical information by electronic transmission. I acknowledge that I have been given the opportunity to request an in-person assessment or other available alternative prior to the telemedicine visit and am voluntarily participating in the telemedicine visit.  I understand that I have the right to withhold or withdraw my consent to the use of telemedicine in the course of my care at any time, without affecting my right to future care or treatment, and that the Practitioner or I may terminate the telemedicine visit at any time. I understand that I have the right to inspect all information obtained and/or recorded in the course of the telemedicine visit and may receive copies of available information for a reasonable fee.  I understand that some of the potential risks of receiving the Services via telemedicine include:   Delay or interruption in medical evaluation due to technological equipment failure or disruption;  Information transmitted may not be sufficient (e.g. poor resolution of images) to allow for appropriate medical decision making by the Practitioner; and/or   In rare instances, security protocols could fail, causing a breach of personal health information.  Furthermore, I acknowledge that it is my responsibility to provide information about my medical history, conditions and care that is complete and accurate to the best of my ability. I acknowledge that Practitioner's advice, recommendations, and/or decision may be based on factors not within their control, such as incomplete or inaccurate data provided by me or distortions of diagnostic images or specimens that may result from electronic transmissions. I understand that the practice of medicine is not an exact science and that Practitioner makes no warranties or guarantees  regarding treatment outcomes. I acknowledge that I will receive a copy of this consent concurrently upon execution via email to the email address I last provided but may also request a printed copy by calling the office of [facility].    I understand that my insurance will be billed for this visit.   I have read or had this consent read to me.  I understand the contents of this consent, which adequately explains the benefits and risks of the Services being provided via telemedicine.   I have been provided ample opportunity to ask questions regarding this consent and the Services and have had my questions answered to my satisfaction.  I give my informed consent for the services to be provided through the use of telemedicine in my medical care  By participating in this telemedicine visit I agree to the above.

## 2018-10-08 NOTE — Telephone Encounter (Signed)
Refill for diltiazem sent to [facility] as requested. Patient is scheduled for an overdue follow up appointment with Dr. Dulce Sellar on Monday, 10/12/2018, at 8:15 am.

## 2018-10-09 DIAGNOSIS — M459 Ankylosing spondylitis of unspecified sites in spine: Secondary | ICD-10-CM

## 2018-10-09 HISTORY — DX: Ankylosing spondylitis of unspecified sites in spine: M45.9

## 2018-10-09 NOTE — Progress Notes (Signed)
Virtual Visit via Video Note   This visit type was conducted due to national recommendations for restrictions regarding the COVID-19 Pandemic (e.g. social distancing) in an effort to limit this patient's exposure and mitigate transmission in our community.  Due to his co-morbid illnesses, this patient is at least at moderate risk for complications without adequate follow up.  This format is felt to be most appropriate for this patient at this time.  All issues noted in this document were discussed and addressed.  A limited physical exam was performed with this format.  Please refer to the patient's chart for his consent to telehealth for [facility].   Date:  10/12/2018   ID:  [name], DOB [birth year], MRN [id number]  Patient Location: Home Provider Location: Office  PCP:  Ronnald Nian, MD  Cardiologist:  No primary care provider on file. Dr Dulce Sellar Electrophysiologist:  None   Evaluation Performed:  Follow-Up Visit CAD  Chief Complaint:  CAD  History of Present Illness:    [name] is a 73 y.o. male with CADwith POBA 1995 and subsequent  PCI of RCA in 1999 and  EF 40% -45%, hypertension and hyperlipdemia last seen DR Donnie Aho 08/05/17.  Review of records catheterization 2009 patent stent right coronary artery 60% proximal LAD stenosis 90% proximal first diagonal stenosis and 50% proximal right coronary artery stenosis.  The patient does not have symptoms concerning for COVID-19 infection (fever, chills, cough, or new shortness of breath).   Fortunately he feels well and has had no angina dyspnea palpitation or syncope but is predominantly limited by his chronic arthritis.  He continues on long-term dual antiplatelet therapy which is appropriate has had no bleeding complication and I would continue the same with his multivessel CAD and repeated interventions.  Although he does not have edema or shortness of breath and concerned about LV dysfunction EF could be  worsened we will check an echocardiogram if less than 40 would require further evaluation including Entresto MRA and a repeat ischemia evaluation.  He has had no angina in the last year palpitations syncope or TIA.  He tells me that he was anemic when he went to donate blood 11.3 he is taking over-the-counter iron and I told him I would reassess at his next visit he has multiple potential etiologies including his rheumatologic disorder and if he remains abnormal will be referred back to his primary care physician   Past Medical History:  Diagnosis Date   ADD (attention deficit disorder)    Allergic rhinitis    skin test 02/23/08 vaccine 2009; allergy shots weekly through Dr. Maple Hudson   Anemia    takes Fe -    Anginal pain (HCC)    Ankylosing spondylitis (HCC)    Anxiety    sees therapist Vernell Leep   Arthritis    Asthma    Dr. 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 Donnie Aho   CHEILECTOMY  10/16/2011   Procedure: CHEILECTOMY;  Surgeon: Sherri Rad, MD;  Location: [facility];  Service: Orthopedics;  Laterality:  Right;   CHOLECYSTECTOMY     COLONOSCOPY     CORONARY ANGIOPLASTY     1995 after MI   CORONARY ANGIOPLASTY WITH STENT PLACEMENT     1999    ESOPHAGOGASTRODUODENOSCOPY  02/18/2012   Procedure: ESOPHAGOGASTRODUODENOSCOPY (EGD);  Surgeon: Florencia Reasons, MD;  Location: [facility];  Service: Endoscopy;  Laterality: N/A;   FOOT SURGERY     joint scraped, arthritis right foot   LUMBAR DISC SURGERY     x2   right index finger  mass removed- arthritis     Dr. Amanda Pea   SPINAL CORD STIMULATOR INSERTION N/A 11/21/2016   Procedure: LUMBAR SPINAL CORD STIMULATOR INSERTION;  Surgeon: Venita Lick, MD;  Location: [facility];  Service: Orthopedics;  Laterality: N/A;  Requests 2.5 hrs   TONSILLECTOMY     1953   UPPER GI ENDOSCOPY  [id number]   VASECTOMY     1980     Current Meds  Medication Sig   ALPRAZolam (XANAX) 0.25 MG tablet TAKE 1 TABLET BY MOUTH TWICE A DAY AS NEEDED   amoxicillin (AMOXIL) 500 MG tablet TAKE 4 TABLETS BY MOUTH 1 HOUR BEFORE DENTAL APPTS   aspirin EC 81 MG tablet Take 81 mg by mouth daily.   atorvastatin (LIPITOR) 80 MG tablet Take 1 tablet (80 mg total) by mouth daily.   azelastine (ASTELIN) 0.1 % nasal spray Place 2 sprays into both nostrils 2 (two) times daily. Use in each nostril as directed   benzonatate (TESSALON) 200 MG capsule TAKE 1 CAPSULE BY MOUTH THREE TIMES A DAY AS NEEDED   buPROPion (WELLBUTRIN XL) 300 MG 24 hr tablet TAKE 1 TABLET BY MOUTH  DAILY   CALCIUM ACETATE-MAGNESIUM CARB PO Take 1 capsule by mouth 2 (two) times daily.   calcium carbonate (OS-CAL) 600 MG TABS tablet Take 600 mg by mouth daily with breakfast.   cetirizine (ZYRTEC) 10 MG tablet Take 10 mg by mouth daily.     cholecalciferol (VITAMIN D) 1000 UNITS tablet Take 1,000 Units by mouth daily.   clopidogrel (PLAVIX) 75 MG tablet TAKE 1 TABLET BY MOUTH  DAILY . NEEDS A FOLLOW UP  APPOINTMENT BEFORE FURTHER  REFILLS   Coenzyme Q10 (COQ10) 200 MG CAPS Take 1 capsule by mouth  daily.   desvenlafaxine (PRISTIQ) 50 MG 24 hr tablet Take 1 tablet (50 mg total) by mouth daily.   diltiazem (CARDIZEM CD) 240 MG 24 hr capsule Take 1 capsule (240 mg total) by mouth daily.   Flaxseed, Linseed, (FLAX SEED OIL) 1000 MG CAPS Take 1 capsule by mouth daily.   GLUCOSAMINE CHONDROITIN COMPLX PO Take 1 tablet by mouth daily.   guaiFENesin (MUCINEX) 600 MG 12 hr tablet Take 600 mg by mouth 2 (two) times daily as needed for cough.    HYDROcodone-acetaminophen (NORCO) 10-325 MG tablet Take 1 tablet by mouth every 6 (six) hours as needed.   meclizine (ANTIVERT) 25  MG tablet TAKE 1 TABLET BY MOUTH  TWICE A DAY AS NEEDED FOR  DIZZINESS   methocarbamol (ROBAXIN) 500 MG tablet Take 1 tablet (500 mg total) by mouth 3 (three) times daily as needed for muscle spasms.   methylphenidate (CONCERTA) 36 MG PO CR tablet Take 1 tablet (36 mg total) by mouth daily.   montelukast (SINGULAIR) 10 MG tablet TAKE 1 TABLET BY MOUTH AT  BEDTIME   Multiple Vitamins-Minerals (MULTIVITAMIN WITH MINERALS) tablet Take 1 tablet by mouth daily.   nitroGLYCERIN (NITROSTAT) 0.4 MG SL tablet Place 0.4 mg under the tongue every 5 (five) minutes as needed.    pantoprazole (PROTONIX) 40 MG tablet Take 1 tablet (40 mg total) by mouth daily.   PROAIR HFA 108 (90 Base) MCG/ACT inhaler TAKE 2 PUFFS BY MOUTH EVERY 4 HOURS AS NEEDED   umeclidinium-vilanterol (ANORO ELLIPTA) 62.5-25 MCG/INH AEPB Inhale 1 puff into the lungs daily.     Allergies:   Patient has no known allergies.   Social History   Tobacco Use   Smoking status: Never Smoker   Smokeless tobacco: Never Used  Substance Use Topics   Alcohol use: Yes    Comment: drinks a glass of wine once a month    Drug use: No     Family Hx: The patient's family history includes Arthritis in his sister; Chronic fatigue in his daughter; Dementia in his father and mother; Diabetes in his mother; Heart disease in his father, mother, and sister. There is no  history of Cancer.  ROS:   Please see the history of present illness.     All other systems reviewed and are negative.   Prior CV studies:   The following studies were reviewed today:    Labs/Other Tests and Data Reviewed:    EKG:  No ECG reviewed.  Recent Labs: 04/23/2018: ALT 23; BUN 12; Creatinine, Ser 0.94; Hemoglobin 14.3; Platelets 294; Potassium 4.3; Sodium 144   Recent Lipid Panel Lab Results  Component Value Date/Time   CHOL 127 04/23/2018 01:31 PM   TRIG 113 04/23/2018 01:31 PM   HDL 41 04/23/2018 01:31 PM   CHOLHDL 3.1 04/23/2018 01:31 PM   CHOLHDL 3.4 10/15/2016 10:56 AM   LDLCALC 63 04/23/2018 01:31 PM    Ref Range & Units 52mo ago 66mo ago 5824yr ago 3619yr ago  Cholesterol, Total 100 - 199 mg/dL 161127  096110     Triglycerides 0 - 149 mg/dL 045113  [id number]   [id number]  R 164High  R  HDL >39 mg/dL 41  62ZHY32Low   86VHQ36Low  R 46NGE37Low  R  VLDL Cholesterol Cal 5 - 40 mg/dL 23  34     LDL Calculated 0 - 99 mg/dL 63  44  54 R 49 R, CM  Chol/HDL Ratio 0.0 - 5.0 ratio 3.1  3.4 CM 3.4 R 3.2 R    Wt Readings from Last 3 Encounters:  10/12/18 167 lb (75.8 kg)  04/23/18 167 lb 3.2 oz (75.8 kg)  04/15/18 170 lb 6.4 oz (77.3 kg)     Objective:    Vital Signs:  BP 117/82 (BP Location: Right Arm, Patient Position: Sitting)    Pulse 75    Ht 5\' 7"  (1.702 m)    Wt 167 lb (75.8 kg)    SpO2 98%    BMI 26.16 kg/m      ASSESSMENT & PLAN:    1. Coronary artery disease stable continue medical therapy if ejection fraction is worsened he did require  an ischemia evaluation for multivessel CAD alterations in medications but at this time New York Heart Association class I.  Current treatment does not include a beta-blocker because of his attention deficit disorder and he will continue dual antiplatelet calcium channel blocker and statin. 2. Hyperlipidemia stable check CMP and lipid before his office follow-up goal LDL less than 70 ideally less than 55 3. Hypertension stable continue current  treatment 4. Left ventricular dysfunction concerning last echocardiogram was 2016 recheck and if less than 40 and ischemia evaluation transition to [facility] and start MRA.  He is not on a beta-blocker. 5. Ankylosing spondylitis stable managed by PCP 6. Attention deficit disorder stable he is on adrenergic stimulant and not on a beta-blocker. 7. Anemia recheck his CBC plan is history  COVID-19 Education: The signs and symptoms of COVID-19 were discussed with the patient and how to seek care for testing (follow up with PCP or arrange E-visit).  The importance of social distancing was discussed today.  Time:   Today, I have spent 25 minutes with the patient with telehealth technology discussing the above problems.     Medication Adjustments/Labs and Tests Ordered: Current medicines are reviewed at length with the patient today.  Concerns regarding medicines are outlined above.   Tests Ordered: No orders of the defined types were placed in this encounter.   Medication Changes: No orders of the defined types were placed in this encounter.   Disposition:  Follow up in 1 month(s)  Signed, Norman Herrlich, MD  10/12/2018 8:44 AM    [facility]

## 2018-10-12 ENCOUNTER — Other Ambulatory Visit: Payer: Self-pay

## 2018-10-12 ENCOUNTER — Telehealth (INDEPENDENT_AMBULATORY_CARE_PROVIDER_SITE_OTHER): Payer: Medicare Other | Admitting: Cardiology

## 2018-10-12 ENCOUNTER — Encounter: Payer: Self-pay | Admitting: Cardiology

## 2018-10-12 VITALS — BP 117/82 | HR 75 | Ht 67.0 in | Wt 167.0 lb

## 2018-10-12 DIAGNOSIS — M450 Ankylosing spondylitis of multiple sites in spine: Secondary | ICD-10-CM

## 2018-10-12 DIAGNOSIS — I25118 Atherosclerotic heart disease of native coronary artery with other forms of angina pectoris: Secondary | ICD-10-CM

## 2018-10-12 DIAGNOSIS — D649 Anemia, unspecified: Secondary | ICD-10-CM

## 2018-10-12 DIAGNOSIS — I10 Essential (primary) hypertension: Secondary | ICD-10-CM

## 2018-10-12 DIAGNOSIS — I519 Heart disease, unspecified: Secondary | ICD-10-CM

## 2018-10-12 DIAGNOSIS — E782 Mixed hyperlipidemia: Secondary | ICD-10-CM

## 2018-10-12 DIAGNOSIS — F909 Attention-deficit hyperactivity disorder, unspecified type: Secondary | ICD-10-CM

## 2018-10-12 HISTORY — DX: Heart disease, unspecified: I51.9

## 2018-10-12 NOTE — Patient Instructions (Signed)
Medication Instructions:  Your physician recommends that you continue on your current medications as directed. Please refer to the Current Medication list given to you today.   If you need a refill on your cardiac medications before your next appointment, please call your pharmacy.   Lab work: Your physician recommends that you return for lab work in: 1ST week of June CMP,CBC,LIPID Fasting  If you have labs (blood work) drawn today and your tests are completely normal, you will receive your results only by: [name] MyChart Message (if you have MyChart) OR . A paper copy in the mail If you have any lab test that is abnormal or we need to change your treatment, we will call you to review the results.  Testing/Procedures:Your physician has requested that you have an echocardiogram. Echocardiography is a painless test that uses sound waves to create images of your heart. It provides your doctor with information about the size and shape of your heart and how well your heart's chambers and valves are working. This procedure takes approximately one hour. There are no restrictions for this procedure.    Follow-Up: At [facility], you and your health needs are our priority.  As part of our continuing mission to provide you with exceptional heart care, we have created designated Provider Care Teams.  These Care Teams include your primary Cardiologist (physician) and Advanced Practice Providers (APPs -  Physician Assistants and Nurse Practitioners) who all work together to provide you with the care you need, when you need it. You will need a follow up appointment in 1 months.  Please call our office 2 months in advance to schedule this appointment.  You may see No primary care provider on file. or another member of our [employer] Provider Team in [place]: Norman Herrlich, MD . Belva Crome, MD  Any Other Special Instructions Will Be Listed Below (If Applicable).   Echocardiogram An echocardiogram  is a procedure that uses painless sound waves (ultrasound) to produce an image of the heart. Images from an echocardiogram can provide important information about:  Signs of coronary artery disease (CAD).  Aneurysm detection. An aneurysm is a weak or damaged part of an artery wall that bulges out from the normal force of blood pumping through the body.  Heart size and shape. Changes in the size or shape of the heart can be associated with certain conditions, including heart failure, aneurysm, and CAD.  Heart muscle function.  Heart valve function.  Signs of a past heart attack.  Fluid buildup around the heart.  Thickening of the heart muscle.  A tumor or infectious growth around the heart valves. Tell a health care provider about:  Any allergies you have.  All medicines you are taking, including vitamins, herbs, eye drops, creams, and over-the-counter medicines.  Any blood disorders you have.  Any surgeries you have had.  Any medical conditions you have.  Whether you are pregnant or may be pregnant. What are the risks? Generally, this is a safe procedure. However, problems may occur, including:  Allergic reaction to dye (contrast) that may be used during the procedure. What happens before the procedure? No specific preparation is needed. You may eat and drink normally. What happens during the procedure?   An IV tube may be inserted into one of your veins.  You may receive contrast through this tube. A contrast is an injection that improves the quality of the pictures from your heart.  A gel will be applied to your chest.  A wand-like tool (transducer) will be moved over your chest. The gel will help to transmit the sound waves from the transducer.  The sound waves will harmlessly bounce off of your heart to allow the heart images to be captured in real-time motion. The images will be recorded on a computer. The procedure may vary among health care providers and  hospitals. What happens after the procedure?  You may return to your normal, everyday life, including diet, activities, and medicines, unless your health care provider tells you not to do that. Summary  An echocardiogram is a procedure that uses painless sound waves (ultrasound) to produce an image of the heart.  Images from an echocardiogram can provide important information about the size and shape of your heart, heart muscle function, heart valve function, and fluid buildup around your heart.  You do not need to do anything to prepare before this procedure. You may eat and drink normally.  After the echocardiogram is completed, you may return to your normal, everyday life, unless your health care provider tells you not to do that. This information is not intended to replace advice given to you by your health care provider. Make sure you discuss any questions you have with your health care provider. Document Released: 05/24/2000 Document Revised: 06/29/2016 Document Reviewed: 06/29/2016 Elsevier Interactive Patient Education  2019 [employer].

## 2018-11-06 ENCOUNTER — Telehealth: Payer: Self-pay | Admitting: Family Medicine

## 2018-11-06 MED ORDER — METHYLPHENIDATE HCL ER (OSM) 36 MG PO TBCR: 36.0000 mg | EXTENDED_RELEASE_TABLET | Freq: Every day | ORAL | 0 refills | Status: DC

## 2018-11-06 NOTE — Telephone Encounter (Signed)
Pt called and is requesting a refill on his concerta pt would like it sent to the [id number] - [place], [place] - [address]

## 2018-11-11 ENCOUNTER — Other Ambulatory Visit: Payer: Self-pay

## 2018-11-11 ENCOUNTER — Ambulatory Visit (HOSPITAL_BASED_OUTPATIENT_CLINIC_OR_DEPARTMENT_OTHER)
Admission: RE | Admit: 2018-11-11 | Discharge: 2018-11-11 | Disposition: A | Discharge status: Home / Self Care | Payer: Medicare Other | Source: Ambulatory Visit | Attending: Cardiology | Admitting: Cardiology

## 2018-11-11 DIAGNOSIS — I119 Hypertensive heart disease without heart failure: Secondary | ICD-10-CM | POA: Insufficient documentation

## 2018-11-11 DIAGNOSIS — J449 Chronic obstructive pulmonary disease, unspecified: Secondary | ICD-10-CM | POA: Diagnosis not present

## 2018-11-11 DIAGNOSIS — I2510 Atherosclerotic heart disease of native coronary artery without angina pectoris: Secondary | ICD-10-CM | POA: Diagnosis not present

## 2018-11-11 DIAGNOSIS — I519 Heart disease, unspecified: Secondary | ICD-10-CM | POA: Diagnosis present

## 2018-11-11 DIAGNOSIS — E119 Type 2 diabetes mellitus without complications: Secondary | ICD-10-CM | POA: Insufficient documentation

## 2018-11-11 DIAGNOSIS — I77810 Thoracic aortic ectasia: Secondary | ICD-10-CM | POA: Diagnosis not present

## 2018-11-11 NOTE — Progress Notes (Signed)
  Echocardiogram 2D Echocardiogram has been performed.  [name] 11/11/2018, 9:01 AM

## 2018-11-18 ENCOUNTER — Other Ambulatory Visit: Payer: Self-pay | Admitting: Cardiology

## 2018-11-18 DIAGNOSIS — I2510 Atherosclerotic heart disease of native coronary artery without angina pectoris: Secondary | ICD-10-CM

## 2018-11-20 ENCOUNTER — Encounter: Payer: Self-pay | Admitting: Cardiology

## 2018-11-20 ENCOUNTER — Other Ambulatory Visit: Payer: Self-pay

## 2018-11-20 ENCOUNTER — Telehealth (INDEPENDENT_AMBULATORY_CARE_PROVIDER_SITE_OTHER): Payer: Medicare Other | Admitting: Cardiology

## 2018-11-20 VITALS — BP 110/65 | HR 70 | Wt 157.0 lb

## 2018-11-20 DIAGNOSIS — I25118 Atherosclerotic heart disease of native coronary artery with other forms of angina pectoris: Secondary | ICD-10-CM

## 2018-11-20 DIAGNOSIS — I519 Heart disease, unspecified: Secondary | ICD-10-CM

## 2018-11-20 DIAGNOSIS — E782 Mixed hyperlipidemia: Secondary | ICD-10-CM

## 2018-11-20 DIAGNOSIS — I10 Essential (primary) hypertension: Secondary | ICD-10-CM

## 2018-11-20 NOTE — Progress Notes (Signed)
Virtual Visit via Video Note   This visit type was conducted due to national recommendations for restrictions regarding the COVID-19 Pandemic (e.g. social distancing) in an effort to limit this patient's exposure and mitigate transmission in our community.  Due to his co-morbid illnesses, this patient is at least at moderate risk for complications without adequate follow up.  This format is felt to be most appropriate for this patient at this time.  All issues noted in this document were discussed and addressed.  A limited physical exam was performed with this format.  Please refer to the patient's chart for his consent to telehealth for [facility].   Date:  11/20/2018   ID:  [name], DOB November 09, 1945, MRN [id number]  Patient Location: Home Provider Location: Office  PCP:  Ronnald NianLalonde, John C, MD  Cardiologist:  Norman HerrlichBrian Demaris Bousquet, MD  Electrophysiologist:  None   Evaluation Performed:  Follow-Up Visit  Chief Complaint:  73 yo male PMH CAD, HTN, HLD presents for 1 month follow up after echocardiogram.   History of Present Illness:    Dwayne PandyMitchell [name] is a 73 y.o. male with  CADwith POBA 1995 and subsequent  PCI of RCA in 1999 and  EF 40% -45%, hypertension and hyperlipdemia last seen 10/12/18.  Review of records catheterization 2009 patent stent right coronary artery 60% proximal LAD stenosis 90% proximal first diagonal stenosis and 50% proximal right coronary artery stenosis.   He reports feeling well. Reports chronic back pain that limits his exercise endeavors, but has been working in the garden, painting, and walking across the pool. Denies SOB, DOE, chest pain. Was encouraged at the results of his recent echocardiogram. Reports he "rarely has problems" with his heart and feels confident he knows when to call the cardiologist for help.   Echo:  11/11/18   IMPRESSIONS  1. The left ventricle has low normal systolic function, with an ejection fraction of 50-55%. The cavity size  was normal. Left ventricular diastolic Doppler parameters are consistent with impaired relaxation. No evidence of left ventricular regional wall  motion abnormalities.  2. The right ventricle has normal systolic function. The cavity was normal. There is no increase in right ventricular wall thickness.  3. No evidence of mitral valve stenosis.  4. The aortic valve is tricuspid. Mild thickening of the aortic valve. Mild calcification of the aortic valve. No stenosis of the aortic valve.  5. There is mild dilatation of the ascending aorta measuring 38 mm. The patient does not have symptoms concerning for COVID-19 infection (fever, chills, cough, or new shortness of breath).    Past Medical History:  Diagnosis Date  . ADD (attention deficit disorder)   . Allergic rhinitis    skin test 02/23/08 vaccine 2009; allergy shots weekly through Dr. Maple HudsonYoung  . Anemia    takes Fe -   . Anginal pain (HCC)   . Ankylosing spondylitis (HCC)   . Anxiety    sees therapist Vernell LeepKen Frazier  . Arthritis   . Asthma    Dr. Jetty Duhamellinton Young  . 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 CAD (coronary artery disease)    Dr. Donnie Ahoilley  . COPD (chronic obstructive pulmonary disease) (HCC)   . Depression   . Depression with anxiety   . Erectile dysfunction   . GERD (gastroesophageal reflux disease)   . History of benign positional vertigo   . History of kidney stones   . Hyperlipidemia   . Hypertension   . Hypogonadism  male    prior use on Testim  . Insomnia    prior borderline sleep study  . Lumbar disc disease   . Mild concentric left ventricular hypertrophy (LVH)   . 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 Pneumonia   . Shortness of breath dyspnea    Past Surgical History:  Procedure Laterality Date  . BRAVO PH STUDY  02/18/2012   Procedure: BRAVO PH STUDY;  Surgeon: Florencia Reasonsobert V Buccini, MD;  Location: [facility];  Service: Endoscopy;  Laterality: N/A;  . CARDIAC CATHETERIZATION  2006    Dr. Donnie Ahoilley  . CHEILECTOMY  10/16/2011   Procedure: CHEILECTOMY;  Surgeon: Sherri RadPaul A Bednarz, MD;  Location: [facility];  Service: Orthopedics;  Laterality: Right;  . CHOLECYSTECTOMY    . COLONOSCOPY    . CORONARY ANGIOPLASTY     1995 after MI  . CORONARY ANGIOPLASTY WITH STENT PLACEMENT     1999   . ESOPHAGOGASTRODUODENOSCOPY  02/18/2012   Procedure: ESOPHAGOGASTRODUODENOSCOPY (EGD);  Surgeon: Florencia Reasonsobert V Buccini, MD;  [facility];  Service: Endoscopy;  Laterality: N/A;  . FOOT SURGERY     joint scraped, arthritis right foot  . LUMBAR DISC SURGERY     x2  . right index finger  mass removed- arthritis     Dr. Amanda PeaGramig  . SPINAL CORD STIMULATOR INSERTION N/A 11/21/2016   Procedure: LUMBAR SPINAL CORD STIMULATOR INSERTION;  Surgeon: Venita LickBrooks, Dahari, MD;  Location: [facility];  Service: Orthopedics;  Laterality: N/A;  Requests 2.5 hrs  . TONSILLECTOMY     1953  . UPPER GI ENDOSCOPY  [id number]  . VASECTOMY     1980     No outpatient medications have been marked as taking for the 11/20/18 encounter (Appointment) with Baldo DaubMunley, Envy Meno J, MD.     Allergies:   Patient has no known allergies.   Social History   Tobacco Use  . Smoking status: Never Smoker  . Smokeless tobacco: Never Used  Substance Use Topics  . Alcohol use: Yes    Comment: drinks a glass of wine once a month   . Drug use: No     Family Hx: The patient's family history includes Arthritis in his sister; Chronic fatigue in his daughter; Dementia in his father and mother; Diabetes in his mother; Heart disease in his father, mother, and sister. There is no history of Cancer.  ROS:   Please see the history of present illness.    Review of Systems  Constitution: Negative for chills, fever and malaise/fatigue.  Cardiovascular: Negative for chest pain, dyspnea on exertion, irregular heartbeat, leg swelling and palpitations.  Respiratory: Negative for cough, shortness of breath and wheezing.   Musculoskeletal:  Positive for back pain (chronic).    All other systems reviewed and are negative.   Prior CV studies:   The following studies were reviewed today:  Echo 11/2018  Labs/Other Tests and Data Reviewed:    EKG:  No ECG reviewed.  Recent Labs: 04/23/2018: ALT 23; BUN 12; Creatinine, Ser 0.94; Hemoglobin 14.3; Platelets 294; Potassium 4.3; Sodium 144   Recent Lipid Panel Lab Results  Component Value Date/Time   CHOL 127 04/23/2018 01:31 PM   TRIG 113 04/23/2018 01:31 PM   HDL 41 04/23/2018 01:31 PM   CHOLHDL 3.1 04/23/2018 01:31 PM   CHOLHDL 3.4 10/15/2016 10:56 AM   LDLCALC 63 04/23/2018 01:31 PM    Wt Readings from Last 3 Encounters:  10/12/18 167 lb (75.8 kg)  04/23/18 167 lb 3.2  oz (75.8 kg)  04/15/18 170 lb 6.4 oz (77.3 kg)     Objective:    Vital Signs:  There were no vitals taken for this visit.   VITAL SIGNS:  reviewed GEN:  no acute distress EYES:  sclerae anicteric, EOMI - Extraocular Movements Intact NEURO:  alert and oriented x 3, no obvious focal deficit PSYCH:  normal affect  ASSESSMENT & PLAN:    1. LV dysfunction - Improved previous EF 40-45% now 50-55% on echo 11/2017. NYHA class I. No edema, SOB, DOE.  2. CAD - Stable, reports no anginal symptoms. Exercising by walking through the pool. Encourage exercise as tolerated due to chronic back pain. Continue long term DAPT as no bleeding complications. Continue GDMT of cardizem, aspirin, high intensity statin.  3. HLD - Stable, followed by PCP. Lipid profile 04/23/18 with LDL 63. Continue high intensity statin.  4. HTN - Well controlled. Continue current anti-hypertensive regimen.   COVID-19 Education: The signs and symptoms of COVID-19 were discussed with the patient and how to seek care for testing (follow up with PCP or arrange E-visit).  The importance of social distancing was discussed today.  Time:   Today, I have spent 25 minutes with the patient with telehealth technology discussing the above  problems.  An additional 5 minutes was spent reviewing his recent echocardiogram.    Medication Adjustments/Labs and Tests Ordered: Current medicines are reviewed at length with the patient today.  Concerns regarding medicines are outlined above.   Tests Ordered: No orders of the defined types were placed in this encounter.   Medication Changes: No orders of the defined types were placed in this encounter.   Follow Up:  In Person in 6 month(s)  Signed, Shirlee More, MD  11/20/2018 8:42 AM    [facility]

## 2018-11-20 NOTE — Patient Instructions (Signed)
Medication Instructions:  Your physician recommends that you continue on your current medications as directed. Please refer to the Current Medication list given to you today.  If you need a refill on your cardiac medications before your next appointment, please call your pharmacy.   Lab work: None If you have labs (blood work) drawn today and your tests are completely normal, you will receive your results only by: . MyChart Message (if you have MyChart) OR . A paper copy in the mail If you have any lab test that is abnormal or we need to change your treatment, we will call you to review the results.  Testing/Procedures: None  Follow-Up: At [facility], you and your health needs are our priority.  As part of our continuing mission to provide you with exceptional heart care, we have created designated Provider Care Teams.  These Care Teams include your primary Cardiologist (physician) and Advanced Practice Providers (APPs -  Physician Assistants and Nurse Practitioners) who all work together to provide you with the care you need, when you need it. You will need a follow up appointment in 6 months. Any Other Special Instructions Will Be Listed Below (If Applicable).    

## 2018-12-09 ENCOUNTER — Other Ambulatory Visit: Payer: Self-pay | Admitting: Cardiology

## 2018-12-14 ENCOUNTER — Ambulatory Visit: Payer: Medicare Other | Admitting: Internal Medicine

## 2018-12-17 ENCOUNTER — Encounter: Payer: Self-pay | Admitting: Family Medicine

## 2018-12-17 ENCOUNTER — Ambulatory Visit (INDEPENDENT_AMBULATORY_CARE_PROVIDER_SITE_OTHER): Payer: Medicare Other | Admitting: Family Medicine

## 2018-12-17 ENCOUNTER — Other Ambulatory Visit: Payer: Self-pay

## 2018-12-17 VITALS — BP 120/82 | HR 82 | Temp 98.2°F | Wt 163.0 lb

## 2018-12-17 DIAGNOSIS — N529 Male erectile dysfunction, unspecified: Secondary | ICD-10-CM

## 2018-12-17 DIAGNOSIS — L209 Atopic dermatitis, unspecified: Secondary | ICD-10-CM | POA: Diagnosis not present

## 2018-12-17 DIAGNOSIS — N4340 Spermatocele of epididymis, unspecified: Secondary | ICD-10-CM

## 2018-12-17 MED ORDER — TADALAFIL 20 MG PO TABS: 20.0000 mg | ORAL_TABLET | Freq: Every day | ORAL | 0 refills | Status: DC | PRN

## 2018-12-17 NOTE — Progress Notes (Signed)
   Subjective:    Patient ID: [name], male    DOB: 28-Aug-1945, 73 y.o.   MRN: [id number]  HPI He is here for evaluation of multiple issues.  He notes difficulty with itching in his scalp as well as in the corners of his nose especially during the spring when he has allergy season.  He has been using fluticasone for this.  He would like a refill. He is also noted difficulty with erectile dysfunction.  He has had difficulty with this in the past and did use Cialis.  He has not used it in quite some time. He also notes a lesion in his right scrotum that intermittently swells.  This started after he had a vasectomy in the 1980s.  Review of Systems     Objective:   Physical Exam Alert and in no distress.  Exam of the scalp shows no active lesions.  Exam of the nasal fold does show slight erythema and drying. Exam of the scrotum does show a 1 and half centimeter round smooth mobile lesion to the proximal vas deferens on the right.       Assessment & Plan:  Atopic dermatitis, unspecified type - Plan: I explained that it would be best to use cortisone cream on the nose.  He plans to try various topical preparations for his scalp including CBD oil.  I explained that there was no good data that this would be good or bad.  Erectile dysfunction, unspecified erectile dysfunction type - Plan: tadalafil (CIALIS) 20 MG tablet, tadalafil (CIALIS) 20 MG tablet, he will try Cialis.  Discussed use of this to help with erections in terms of using it at least an hour before time.  He will let me know if it works  Sales executive of epididymis - Plan: Reassured him that the spermatocele is of no major concern.  He was comfortable with that.

## 2018-12-30 ENCOUNTER — Other Ambulatory Visit: Payer: Self-pay

## 2018-12-30 MED ORDER — MECLIZINE HCL 25 MG PO TABS: ORAL_TABLET | ORAL | 0 refills | Status: DC

## 2018-12-30 NOTE — Telephone Encounter (Signed)
[facility] sent a fax requesting a refill on the following medication

## 2019-01-08 ENCOUNTER — Ambulatory Visit (INDEPENDENT_AMBULATORY_CARE_PROVIDER_SITE_OTHER): Payer: Medicare Other | Admitting: Internal Medicine

## 2019-01-08 ENCOUNTER — Encounter: Payer: Self-pay | Admitting: Internal Medicine

## 2019-01-08 ENCOUNTER — Other Ambulatory Visit: Payer: Self-pay

## 2019-01-08 DIAGNOSIS — I25118 Atherosclerotic heart disease of native coronary artery with other forms of angina pectoris: Secondary | ICD-10-CM | POA: Diagnosis not present

## 2019-01-08 DIAGNOSIS — J4520 Mild intermittent asthma, uncomplicated: Secondary | ICD-10-CM | POA: Diagnosis not present

## 2019-01-08 NOTE — Progress Notes (Signed)
Patient ID: [name], male    DOB: Dec 31, 1945, 73 y.o.   MRN: [id number]  HPI M never smoker followed for asthma, allergic rhinitis complicated by hx ADHD, CAD/ MI, GERD, anemia    --------------------------------------------------------------------------------   08/28/17- 2 yo M never smoker followed for asthma, allergic rhinitis complicated by hx ADHD, CAD/ MI, GERD, anemia, back pain/spinal stimulator -----Tightness in chest with chest congestion. SOB and cough at times.  CXR 11/13/16- No active cardiopulmonary disease. Chest is felt tighter and noticing more postnasal drip over the last 2 weeks.  No obvious infection or acute event.  Using guaifenesin twice daily.  Agrees timing suggests the current onset of pollen season.  01/08/2019- 63 yo M never smoker followed for asthma, allergic rhinitis complicated by hx ADHD, CAD/ MI, GERD, anemia, back pain/spinal stimulator -----F/U for Chronic Asthma; pt states breathing is "pretty good" most of the time, reports DOE Anoro, ProAir, Singulair Denies cough or wheeze. DOE climbing hills- maybe not different. No acute. Cardiac status stable with no new events. Infrequent need for rescue inhaler with no sleep disturbance  Review of Systems-see HPI + = positive Constitutional:   No-   weight loss, night sweats, fevers, chills, fatigue, lassitude. HEENT:   No-current  headaches, difficulty swallowing, tooth/dental problems, sore throat,       No-  sneezing, itching, ear ache, no-nasal congestion, post nasal drip,  CV:  No-chest pain, No- orthopnea, PND, swelling in lower extremities, anasarca, dizziness, palpitations Resp: +   shortness of breath with exertion or at rest.              No-   productive cough,   non-productive cough,  No- coughing up of blood.              No-   change in color of mucus.  No- wheezing.   Skin: No-   rash or lesions. GI:  No-   heartburn, indigestion, abdominal pain, nausea, vomiting,  GU:  MS:  No-    joint pain or swelling. Neuro-     nothing unusual Psych:  No- change in mood or affect. No depression or anxiety.  No memory loss.     Objective:   Physical Exam General- Alert, Oriented, Affect-appropriate, Distress- none acute Skin- rash-none, lesions- none, excoriation- none Lymphadenopathy- none Head- atraumatic            Eyes- Gross vision intact, PERRLA, conjunctivae clear secretions            Ears- Hearing, canals-normal            Nose- Clear, no-Septal dev, mucus, polyps, erosion, perforation             Throat- Mallampati II , mucosa clear, drainage- none, tonsils- atrophic.  Neck- flexible , trachea midline, no stridor , thyroid nl, carotid no bruit Chest - symmetrical excursion , unlabored           Heart/CV- RRR , no murmur , no gallop  , no rub, nl s1 s2                           - JVD- none , edema- none, stasis changes- none, varices- none           Lung-+ clear , good airflow,wheeze- none, cough- none , dullness-none, rub- none           Chest wall- + spinal stimulator Abd- Br/ Gen/ Rectal- Not done,  not indicated Extrem- Normal apparent strength and mobility Neuro- grossly intact to observation

## 2019-01-08 NOTE — Patient Instructions (Signed)
Ok to  continue current meds  Please call if we can help   

## 2019-02-11 ENCOUNTER — Other Ambulatory Visit: Payer: Self-pay | Admitting: Cardiology

## 2019-02-11 DIAGNOSIS — I2510 Atherosclerotic heart disease of native coronary artery without angina pectoris: Secondary | ICD-10-CM

## 2019-02-21 NOTE — Assessment & Plan Note (Signed)
Well controlled and current meds sufficient

## 2019-02-21 NOTE — Assessment & Plan Note (Signed)
He denies active concerns. Continues surveillance.

## 2019-04-22 DIAGNOSIS — M65332 Trigger finger, left middle finger: Secondary | ICD-10-CM

## 2019-04-22 DIAGNOSIS — M65331 Trigger finger, right middle finger: Secondary | ICD-10-CM | POA: Insufficient documentation

## 2019-04-22 HISTORY — DX: Trigger finger, left middle finger: M65.332

## 2019-04-22 HISTORY — DX: Trigger finger, right middle finger: M65.331

## 2019-05-03 ENCOUNTER — Telehealth: Payer: Self-pay

## 2019-05-03 ENCOUNTER — Telehealth: Payer: Self-pay | Admitting: Family Medicine

## 2019-05-03 DIAGNOSIS — F418 Other specified anxiety disorders: Secondary | ICD-10-CM

## 2019-05-03 MED ORDER — METHYLPHENIDATE HCL ER (OSM) 36 MG PO TBCR: 36.0000 mg | EXTENDED_RELEASE_TABLET | Freq: Every day | ORAL | 0 refills | Status: DC

## 2019-05-03 MED ORDER — DESVENLAFAXINE SUCCINATE ER 50 MG PO TB24: 50.0000 mg | ORAL_TABLET | Freq: Every day | ORAL | 3 refills | Status: DC

## 2019-05-03 MED ORDER — BUPROPION HCL ER (XL) 300 MG PO TB24: 300.0000 mg | ORAL_TABLET | Freq: Every day | ORAL | 3 refills | Status: DC

## 2019-05-03 NOTE — Telephone Encounter (Signed)
LVM for pt to call back to schedule an appt. KH 

## 2019-05-03 NOTE — Telephone Encounter (Signed)
Schedule him for blood work in a couple of months.

## 2019-05-03 NOTE — Telephone Encounter (Signed)
Pt called for refills of concerta. Please send to [facility] [address].

## 2019-05-03 NOTE — Telephone Encounter (Signed)
Received fax from [facility] for refill on Pristiq and bupropion pt. Was last seen 12/17/18.

## 2019-05-04 NOTE — Telephone Encounter (Signed)
appt made. KH 

## 2019-05-05 ENCOUNTER — Other Ambulatory Visit: Payer: Self-pay

## 2019-05-05 DIAGNOSIS — E785 Hyperlipidemia, unspecified: Secondary | ICD-10-CM

## 2019-05-05 MED ORDER — ATORVASTATIN CALCIUM 80 MG PO TABS: 80.0000 mg | ORAL_TABLET | Freq: Every day | ORAL | 0 refills | Status: DC

## 2019-05-10 ENCOUNTER — Ambulatory Visit (INDEPENDENT_AMBULATORY_CARE_PROVIDER_SITE_OTHER): Payer: Medicare Other | Admitting: Family Medicine

## 2019-05-10 ENCOUNTER — Encounter: Payer: Self-pay | Admitting: Family Medicine

## 2019-05-10 ENCOUNTER — Other Ambulatory Visit: Payer: Self-pay

## 2019-05-10 VITALS — BP 110/70 | HR 72 | Temp 96.0°F | Ht 67.0 in | Wt 166.2 lb

## 2019-05-10 DIAGNOSIS — N410 Acute prostatitis: Secondary | ICD-10-CM

## 2019-05-10 DIAGNOSIS — R350 Frequency of micturition: Secondary | ICD-10-CM

## 2019-05-10 LAB — POCT URINALYSIS DIP (PROADVANTAGE DEVICE)
Bilirubin, UA: NEGATIVE
Blood, UA: NEGATIVE
Glucose, UA: NEGATIVE mg/dL
Leukocytes, UA: NEGATIVE
Nitrite, UA: NEGATIVE
Protein Ur, POC: NEGATIVE mg/dL
Specific Gravity, Urine: 1.03
Urobilinogen, Ur: NEGATIVE
pH, UA: 6 (ref 5.0–8.0)

## 2019-05-10 MED ORDER — CIPROFLOXACIN HCL 500 MG PO TABS: 500.0000 mg | ORAL_TABLET | Freq: Two times a day (BID) | ORAL | 0 refills | Status: DC

## 2019-05-10 NOTE — Patient Instructions (Signed)
Take the antibiotics twice daily for 2 weeks. Contact us if your symptoms aren't improving within the next 2-3 days.  Definitely let us know if you are worsening--fever, nausea, vomiting, blood in the urine, or other complaints develop.   Prostatitis  Prostatitis is swelling of the prostate gland. The prostate helps to make semen. It is below a man's bladder, in front of the rectum. There are different types of prostatitis. Follow these instructions at home:   Take over-the-counter and prescription medicines only as told by your doctor.  If you were prescribed an antibiotic medicine, take it as told by your doctor. Do not stop taking the antibiotic even if you start to feel better.  If your doctor prescribed exercises, do them as directed.  Take sitz baths as told by your doctor. To take a sitz bath, sit in warm water that is deep enough to cover your hips and butt.  Keep all follow-up visits as told by your doctor. This is important. Contact a doctor if:  Your symptoms get worse.  You have a fever. Get help right away if:  You have chills.  You feel sick to your stomach (nauseous).  You throw up (vomit).  You feel light-headed.  You feel like you might pass out (faint).  You cannot pee (urinate).  You have blood or clumps of blood (blood clots) in your pee (urine). This information is not intended to replace advice given to you by your health care provider. Make sure you discuss any questions you have with your health care provider. Document Released: 11/26/2011 Document Revised: 05/09/2017 Document Reviewed: 02/15/2016 Elsevier Patient Education  2020 [employer].

## 2019-05-10 NOTE — Progress Notes (Signed)
Chief Complaint  Patient presents with  . Urinary Frequency    and pain/burning with urination that started last night. Dribbles when he goes to the bathroom.    Lastnight he noted some burning with urination.  Had slight discomfort this morning.  He had dribbling went he went to the bathroom in art class today.  He started having urinary frequency, but only dribbling.   Urine was normal in color, no blood, no abnormal odor.  Similar problem in the past, diagnosed as a bladder infection, several year ago, no problems since.  Only up 1 time to void at night usually, unchanged last night.    No fever, chills.  Some chronic low back pain, denies flank pain. Slight discomfort in lower abdomen. Denies any high risk sexual behavior.   PMH, PSH, SH reviewed Denies any recent medication changes  Outpatient Encounter Medications as of 05/10/2019  Medication Sig Note  . ALPRAZolam (XANAX) 0.25 MG tablet TAKE 1 TABLET BY MOUTH TWICE A DAY AS NEEDED   . aspirin EC 81 MG tablet Take 81 mg by mouth daily.   [name] atorvastatin (LIPITOR) 80 MG tablet Take 1 tablet (80 mg total) by mouth daily.   [name] buPROPion (WELLBUTRIN XL) 300 MG 24 hr tablet Take 1 tablet (300 mg total) by mouth daily.   . calcium carbonate (OS-CAL) 600 MG TABS tablet Take 600 mg by mouth daily with breakfast.   . cetirizine (ZYRTEC) 10 MG tablet Take 10 mg by mouth daily.     . clopidogrel (PLAVIX) 75 MG tablet TAKE 1 TABLET BY MOUTH  DAILY   . Coenzyme Q10 (COQ10) 200 MG CAPS Take 1 capsule by mouth daily.   [name] desvenlafaxine (PRISTIQ) 50 MG 24 hr tablet Take 1 tablet (50 mg total) by mouth daily.   [name] diltiazem (CARDIZEM CD) 240 MG 24 hr capsule TAKE 1 CAPSULE BY MOUTH  DAILY   . Flaxseed, Linseed, (FLAX SEED OIL) 1000 MG CAPS Take 1 capsule by mouth daily.   [name] GLUCOSAMINE CHONDROITIN COMPLX PO Take 1 tablet by mouth daily.   [name] guaiFENesin (MUCINEX) 600 MG 12 hr tablet Take 600 mg by mouth 2 (two) times daily as needed for cough.     Melene Muller ON 07/03/2019] methylphenidate (CONCERTA) 36 MG PO CR tablet Take 1 tablet (36 mg total) by mouth daily.   Melene Muller ON 06/02/2019] methylphenidate (CONCERTA) 36 MG PO CR tablet Take 1 tablet (36 mg total) by mouth daily.   . methylphenidate (CONCERTA) 36 MG PO CR tablet Take 1 tablet (36 mg total) by mouth daily.   . montelukast (SINGULAIR) 10 MG tablet TAKE 1 TABLET BY MOUTH AT  BEDTIME   . Multiple Vitamins-Minerals (MULTIVITAMIN WITH MINERALS) tablet Take 1 tablet by mouth daily.   . pantoprazole (PROTONIX) 40 MG tablet Take 1 tablet (40 mg total) by mouth daily.   [name] umeclidinium-vilanterol (ANORO ELLIPTA) 62.5-25 MCG/INH AEPB Inhale 1 puff into the lungs daily.   [name] amoxicillin (AMOXIL) 500 MG tablet TAKE 4 TABLETS BY MOUTH 1 HOUR BEFORE DENTAL APPTS   . azelastine (ASTELIN) 0.1 % nasal spray Place 2 sprays into both nostrils 2 (two) times daily. Use in each nostril as directed (Patient not taking: Reported on 05/10/2019) 04/15/2018: Admits to only using 1/2 spray, he doesn't like the taste.  . cholecalciferol (VITAMIN D) 1000 UNITS tablet Take 1,000 Units by mouth daily.   . meclizine (ANTIVERT) 25 MG tablet 1 twice a day as needed for dizziness (Patient  not taking: Reported on 05/10/2019)   . nitroGLYCERIN (NITROSTAT) 0.4 MG SL tablet Place 0.4 mg under the tongue every 5 (five) minutes as needed.    [name] PROAIR HFA 108 (90 Base) MCG/ACT inhaler TAKE 2 PUFFS BY MOUTH EVERY 4 HOURS AS NEEDED (Patient not taking: Reported on 05/10/2019)   . tadalafil (CIALIS) 20 MG tablet Take 1 tablet (20 mg total) by mouth daily as needed for erectile dysfunction. (Patient not taking: Reported on 05/10/2019)   . tadalafil (CIALIS) 20 MG tablet Take 1 tablet (20 mg total) by mouth daily as needed for erectile dysfunction. (Patient not taking: Reported on 05/10/2019)   . [DISCONTINUED] benzonatate (TESSALON) 200 MG capsule TAKE 1 CAPSULE BY MOUTH THREE TIMES A DAY AS NEEDED (Patient not taking: Reported on  12/17/2018)   . [DISCONTINUED] HYDROcodone-acetaminophen (NORCO) 10-325 MG tablet Take 1 tablet by mouth every 6 (six) hours as needed. (Patient not taking: Reported on 12/17/2018)   . [DISCONTINUED] methocarbamol (ROBAXIN) 500 MG tablet Take 1 tablet (500 mg total) by mouth 3 (three) times daily as needed for muscle spasms. (Patient not taking: Reported on 12/17/2018)    No facility-administered encounter medications on file as of 05/10/2019.    No Known Allergies  ROS:  No fever, chills, URI symptoms, cough, chest pain, shortness of breath.  No nausea, vomiting, constipation or diarrhea. Yesterday he had some trouble moving his bowels at first, but did have a normal movement. Urinary symptoms per HPI.  PHYSICAL EXAM:  Temp (!) 96 F (35.6 C) (Other (Comment))   Ht 5\' 7"  (1.702 m)   Wt 166 lb 3.2 oz (75.4 kg)   BMI 26.03 kg/m   Pleasant, well-appearing male, in good spirits, in no distress HEENT:conjunctiva and sclera are clear, EOMI. Wearing mask Neck: no lymphadenopathy or mass Heart: regular rate and rhythm Lungs: clear bilaterally Back: no CVA tenderness Abdomen: soft, nontender, no organomegaly or mass nontender in suprapubic area Extremities: no edema Rectal: Right side of prostate gland was significantly enlarged and boggy. Left side was normal, firm.  No nodules appreciated. Not tender  Urine dip: SG 1.030, trace ketones, otherwise normal   ASSESSMENT/PLAN:  Acute prostatitis - Plan: ciprofloxacin (CIPRO) 500 MG tablet  Urinary frequency - Plan: POCT Urinalysis DIP (Proadvantage Device)

## 2019-05-21 ENCOUNTER — Telehealth: Payer: Self-pay

## 2019-05-21 NOTE — Telephone Encounter (Signed)
LVM for pt to call back to office and schedule for a med well visit. [place]

## 2019-05-21 NOTE — Telephone Encounter (Signed)
-----   Message from Rita Ohara, MD sent at 05/10/2019  5:19 PM EST ----- Looks like you scheduled him for labs in January.He is due for CPE/AWV with Dr. Redmond School (last was 04/2018).  I saw him for acute visit. Please ensure he gets visit with JCL scheduled, not just labs.  Thanks!

## 2019-05-25 NOTE — Progress Notes (Signed)
Cardiology Office Note:    Date:  05/26/2019   ID:  [name], DOB Jan 03, 1946, MRN [id number]  PCP:  Ronnald Nian, MD  Cardiologist:  Norman Herrlich, MD    Referring MD: Ronnald Nian, MD    ASSESSMENT:    1. Coronary artery disease of native artery of native heart with stable angina pectoris (HCC)   2. Left ventricular dysfunction   3. Essential hypertension   4. Mixed hyperlipidemia   5. Educated about COVID-19 virus infection    PLAN:    In order of problems listed above:  1. Stable, he is now 25 years after his myocardial infarction New [place] Heart Association class I and is having no angina on current medical treatment.  He will continue aspirin rate limiting calcium channel blocker and high intensity statin.  We will renew his prescription for nitroglycerin that he can avoid emergency room. 2. Improved EF now low normal.  He has minimal enlargement of his thoracic aorta and does not require repeat imaging at this time 3. Stable BP at target continue treatment with his calcium channel blocker 4. Continue statin he has no side effects check liver function safety lipid profile efficacy goal LDL less than 70 5. Reinforced with 3 drug use encouraged him to wear eye protection   Next appointment: 1 year   Medication Adjustments/Labs and Tests Ordered: Current medicines are reviewed at length with the patient today.  Concerns regarding medicines are outlined above.  No orders of the defined types were placed in this encounter.  No orders of the defined types were placed in this encounter.   Chief Complaint  Patient presents with  . Follow-up    History of Present Illness:    [name] is a 73 y.o. male with a hx of  CADwith POBA 1995 and subsequent  PCI of RCA in 1999 and  EF 40% -45%, hypertension and hyperlipdemia  Review of records catheterization 2009 patent stent right coronary artery 60% proximal LAD stenosis,  90% proximal first diagonal  stenosis and 50% proximal right coronary artery stenosis.  He was last seen 11/20/2018.  Echocardiogram performed 11/20/2018 shows EF low normal 50 to 55% there is no segmental left ventricular dysfunction and there is mild dilation of the ascending aorta 38 mm.  Compliance with diet, lifestyle and medications: Yes  He practices all the preventive mechanisms for COVID-19 and I told him it is time to start wearing eye protection outside the home in public especially with a large building ventilation systems. He has had no angina shortness of breath chest pain palpitation or syncope and tolerates a statin without muscle pain or weakness.  He is due for labs today so we will check a comprehensive panel for liver function renal function and lipid profile goal LDL less than 70.  At this time after discussion with the patient we do not feel he requires an ischemia evaluation. Past Medical History:  Diagnosis Date  . ADD (attention deficit disorder)   . Allergic rhinitis    skin test 02/23/08 vaccine 2009; allergy shots weekly through Dr. Maple Hudson  . Anemia    takes Fe -   . Anginal pain (HCC)   . Ankylosing spondylitis (HCC)   . Anxiety    sees therapist Vernell Leep  . Arthritis   . Asthma    Dr. Jetty Duhamel  . 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 CAD (coronary artery disease)    Dr. Donnie Aho  .  COPD (chronic obstructive pulmonary disease) ([place])   . Depression   . Depression with anxiety   . Erectile dysfunction   . GERD (gastroesophageal reflux disease)   . History of benign positional vertigo   . History of kidney stones   . Hyperlipidemia   . Hypertension   . Hypogonadism male    prior use on Testim  . Insomnia    prior borderline sleep study  . Lumbar disc disease   . Mild concentric left ventricular hypertrophy (LVH)   . 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 Pneumonia   . Shortness of breath dyspnea     Past Surgical History:  Procedure Laterality  Date  . BRAVO [place] STUDY  02/18/2012   Procedure: BRAVO [place];  Surgeon: Cleotis Nipper, MD;  Location: [facility];  Service: Endoscopy;  Laterality: N/A;  . CARDIAC CATHETERIZATION  2006   Dr. Wynonia Lawman  . CHEILECTOMY  10/16/2011   Procedure: CHEILECTOMY;  Surgeon: Colin Rhein, MD;  Location: [place];  Service: Orthopedics;  Laterality: Right;  . CHOLECYSTECTOMY    . COLONOSCOPY    . CORONARY ANGIOPLASTY     1995 after MI  . CORONARY ANGIOPLASTY WITH STENT PLACEMENT     1999   . ESOPHAGOGASTRODUODENOSCOPY  02/18/2012   Procedure: ESOPHAGOGASTRODUODENOSCOPY (EGD);  Surgeon: Cleotis Nipper, MD;  Location: [facility];  Service: Endoscopy;  Laterality: N/A;  . FOOT SURGERY     joint scraped, arthritis right foot  . LUMBAR DISC SURGERY     x2  . right index finger  mass removed- arthritis     Dr. Amedeo Plenty  . SPINAL CORD STIMULATOR INSERTION N/A 11/21/2016   Procedure: LUMBAR SPINAL CORD STIMULATOR INSERTION;  Surgeon: Melina Schools, MD;  Location: [facility];  Service: Orthopedics;  Laterality: N/A;  Requests 2.5 hrs  . TONSILLECTOMY     1953  . UPPER GI ENDOSCOPY  [id number]  . VASECTOMY     1980    Current Medications: Current Meds  Medication Sig  . ALPRAZolam (XANAX) 0.25 MG tablet TAKE 1 TABLET BY MOUTH TWICE A DAY AS NEEDED  . amoxicillin (AMOXIL) 500 MG tablet TAKE 4 TABLETS BY MOUTH 1 HOUR BEFORE DENTAL APPTS  . aspirin EC 81 MG tablet Take 81 mg by mouth daily.  [name] atorvastatin (LIPITOR) 80 MG tablet Take 1 tablet (80 mg total) by mouth daily.  [name] azelastine (ASTELIN) 0.1 % nasal spray Place 2 sprays into both nostrils 2 (two) times daily. Use in each nostril as directed  . buPROPion (WELLBUTRIN XL) 300 MG 24 hr tablet Take 1 tablet (300 mg total) by mouth daily.  . calcium carbonate (OS-CAL) 600 MG TABS tablet Take 600 mg by mouth daily with breakfast.  . cetirizine (ZYRTEC) 10 MG tablet Take 10 mg by mouth daily.    . ciprofloxacin (CIPRO) 500 MG tablet Take  1 tablet (500 mg total) by mouth 2 (two) times daily.  . clopidogrel (PLAVIX) 75 MG tablet TAKE 1 TABLET BY MOUTH  DAILY  . Coenzyme Q10 (COQ10) 200 MG CAPS Take 1 capsule by mouth daily.  [name] desvenlafaxine (PRISTIQ) 50 MG 24 hr tablet Take 1 tablet (50 mg total) by mouth daily.  [name] diltiazem (CARDIZEM CD) 240 MG 24 hr capsule TAKE 1 CAPSULE BY MOUTH  DAILY  . ferrous sulfate 325 (65 FE) MG tablet Take 325 mg by mouth daily with breakfast.  . Flaxseed, Linseed, (FLAX SEED OIL) 1000 MG CAPS Take 1  capsule by mouth daily.  [name] GLUCOSAMINE CHONDROITIN COMPLX PO Take 1 tablet by mouth daily.  [name] guaiFENesin (MUCINEX) 600 MG 12 hr tablet Take 600 mg by mouth 2 (two) times daily as needed for cough.   . meclizine (ANTIVERT) 25 MG tablet 1 twice a day as needed for dizziness  . [START ON 07/03/2019] methylphenidate (CONCERTA) 36 MG PO CR tablet Take 1 tablet (36 mg total) by mouth daily.  . montelukast (SINGULAIR) 10 MG tablet TAKE 1 TABLET BY MOUTH AT  BEDTIME  . Multiple Vitamins-Minerals (MULTIVITAMIN WITH MINERALS) tablet Take 1 tablet by mouth daily.  . nitroGLYCERIN (NITROSTAT) 0.4 MG SL tablet Place 0.4 mg under the tongue every 5 (five) minutes as needed.   . pantoprazole (PROTONIX) 40 MG tablet Take 1 tablet (40 mg total) by mouth daily.  [name] PROAIR HFA 108 (90 Base) MCG/ACT inhaler TAKE 2 PUFFS BY MOUTH EVERY 4 HOURS AS NEEDED  . QUERCETIN PO Take 500 mg by mouth daily.  [name] umeclidinium-vilanterol (ANORO ELLIPTA) 62.5-25 MCG/INH AEPB Inhale 1 puff into the lungs daily.     Allergies:   Patient has no known allergies.   Social History   Socioeconomic History  . Marital status: Married    Spouse name: Not on file  . Number of children: Y  . Years of education: Not on file  . Highest education level: Not on file  Occupational History  . Occupation: Chief of Staff  . Occupation: retired- Chiropractor  Tobacco Use  . Smoking status: Never Smoker  . Smokeless  tobacco: Never Used  Substance and Sexual Activity  . Alcohol use: Yes    Comment: drinks a glass of wine once a month   . Drug use: No  . Sexual activity: Not Currently  Other Topics Concern  . Not on file  Social History Narrative   Married, has 2 children, 38yo son, 35yo daughter with fibromyalgia, chronic fatigue syndrome, former Metallurgist.   Artist (paints)   Social Determinants of Health   Financial Resource Strain:   . Difficulty of Paying Living Expenses: Not on file  Food Insecurity:   . Worried About Programme researcher, broadcasting/film/video in the Last Year: Not on file  . Ran Out of Food in the Last Year: Not on file  Transportation Needs:   . Lack of Transportation (Medical): Not on file  . Lack of Transportation (Non-Medical): Not on file  Physical Activity:   . Days of Exercise per Week: Not on file  . Minutes of Exercise per Session: Not on file  Stress:   . Feeling of Stress : Not on file  Social Connections:   . Frequency of Communication with Friends and Family: Not on file  . Frequency of Social Gatherings with Friends and Family: Not on file  . Attends Religious Services: Not on file  . Active Member of Clubs or Organizations: Not on file  . Attends Banker Meetings: Not on file  . Marital Status: Not on file     Family History: The patient's family history includes Arthritis in his sister; Chronic fatigue in his daughter; Dementia in his father and mother; Diabetes in his mother; Heart disease in his father, mother, and sister. There is no history of Cancer. ROS:   Please see the history of present illness.    All other systems reviewed and are negative.  EKGs/Labs/Other Studies Reviewed:    The following studies were reviewed today:  EKG:  EKG ordered today and personally reviewed.  The ekg ordered today demonstrates sinus rhythm old inferior infarction  Recent Labs: No results found for requested labs within last 8760 hours.  Recent Lipid  Panel    Component Value Date/Time   CHOL 127 04/23/2018 1331   TRIG 113 04/23/2018 1331   HDL 41 04/23/2018 1331   CHOLHDL 3.1 04/23/2018 1331   CHOLHDL 3.4 10/15/2016 1056   VLDL 33 (H) 10/15/2016 1056   LDLCALC 63 04/23/2018 1331    Physical Exam:    VS:  BP 124/80 (BP Location: Right Arm, Patient Position: Sitting, Cuff Size: Normal)   Pulse 74   Ht 5\' 7"  (1.702 m)   Wt 166 lb (75.3 kg)   SpO2 98%   BMI 26.00 kg/m     Wt Readings from Last 3 Encounters:  05/26/19 166 lb (75.3 kg)  05/10/19 166 lb 3.2 oz (75.4 kg)  01/08/19 166 lb (75.3 kg)     GEN:  Well nourished, well developed in no acute distress HEENT: Normal NECK: No JVD; No carotid bruits LYMPHATICS: No lymphadenopathy CARDIAC: RRR, no murmurs, rubs, gallops RESPIRATORY:  Clear to auscultation without rales, wheezing or rhonchi  ABDOMEN: Soft, non-tender, non-distended MUSCULOSKELETAL:  No edema; No deformity  SKIN: Warm and dry NEUROLOGIC:  Alert and oriented x 3 PSYCHIATRIC:  Normal affect    Signed, Norman HerrlichBrian Ayane Delancey, MD  05/26/2019 9:49 AM    [facility]

## 2019-05-26 ENCOUNTER — Encounter: Payer: Self-pay | Admitting: Cardiology

## 2019-05-26 ENCOUNTER — Ambulatory Visit (INDEPENDENT_AMBULATORY_CARE_PROVIDER_SITE_OTHER): Payer: Medicare Other | Admitting: Cardiology

## 2019-05-26 ENCOUNTER — Other Ambulatory Visit: Payer: Self-pay

## 2019-05-26 VITALS — BP 124/80 | HR 74 | Ht 67.0 in | Wt 166.0 lb

## 2019-05-26 DIAGNOSIS — I519 Heart disease, unspecified: Secondary | ICD-10-CM | POA: Diagnosis not present

## 2019-05-26 DIAGNOSIS — I10 Essential (primary) hypertension: Secondary | ICD-10-CM | POA: Diagnosis not present

## 2019-05-26 DIAGNOSIS — I25118 Atherosclerotic heart disease of native coronary artery with other forms of angina pectoris: Secondary | ICD-10-CM

## 2019-05-26 DIAGNOSIS — Z7189 Other specified counseling: Secondary | ICD-10-CM

## 2019-05-26 DIAGNOSIS — E782 Mixed hyperlipidemia: Secondary | ICD-10-CM

## 2019-05-26 MED ORDER — NITROGLYCERIN 0.4 MG SL SUBL: 0.4000 mg | SUBLINGUAL_TABLET | SUBLINGUAL | 11 refills | Status: DC | PRN

## 2019-05-26 NOTE — Patient Instructions (Addendum)
Medication Instructions:  Your physician recommends that you continue on your current medications as directed. Please refer to the Current Medication list given to you today.  *If you need a refill on your cardiac medications before your next appointment, please call your pharmacy*  Lab Work: Your physician recommends that you return for lab work today: CMP, lipid panel.   If you have labs (blood work) drawn today and your tests are completely normal, you will receive your results only by: . MyChart Message (if you have MyChart) OR . A paper copy in the mail If you have any lab test that is abnormal or we need to change your treatment, we will call you to review the results.  Testing/Procedures: You had an EKG today.   Follow-Up: At [facility], you and your health needs are our priority.  As part of our continuing mission to provide you with exceptional heart care, we have created designated Provider Care Teams.  These Care Teams include your primary Cardiologist (physician) and Advanced Practice Providers (APPs -  Physician Assistants and Nurse Practitioners) who all work together to provide you with the care you need, when you need it.  Your next appointment:   1 year(s)  The format for your next appointment:   In Person  Provider:   Brenden Rudman, MD    Purchase on line at [employer] or at [facility] on [address] [place]   

## 2019-05-26 NOTE — Addendum Note (Signed)
Addended by: Austin Miles on: 05/26/2019 10:09 AM   Modules accepted: Orders

## 2019-05-27 LAB — COMPREHENSIVE METABOLIC PANEL
ALT: 21 IU/L (ref 0–44)
AST: 18 IU/L (ref 0–40)
Albumin/Globulin Ratio: 1.9 (ref 1.2–2.2)
Albumin: 4.7 g/dL (ref 3.7–4.7)
Alkaline Phosphatase: 88 IU/L (ref 39–117)
BUN/Creatinine Ratio: 12 (ref 10–24)
BUN: 12 mg/dL (ref 8–27)
Bilirubin Total: 0.3 mg/dL (ref 0.0–1.2)
CO2: 25 mmol/L (ref 20–29)
Calcium: 9.4 mg/dL (ref 8.6–10.2)
Chloride: 105 mmol/L (ref 96–106)
Creatinine, Ser: 1.01 mg/dL (ref 0.76–1.27)
GFR calc Af Amer: 85 mL/min/{1.73_m2} (ref >59)
GFR calc non Af Amer: 73 mL/min/{1.73_m2} (ref >59)
Globulin, Total: 2.5 g/dL (ref 1.5–4.5)
Glucose: 91 mg/dL (ref 65–99)
Potassium: 3.9 mmol/L (ref 3.5–5.2)
Sodium: 143 mmol/L (ref 134–144)
Total Protein: 7.2 g/dL (ref 6.0–8.5)

## 2019-05-27 LAB — LIPID PANEL
Chol/HDL Ratio: 2.7 ratio (ref 0.0–5.0)
Cholesterol, Total: 122 mg/dL (ref 100–199)
HDL: 45 mg/dL (ref >39)
LDL Chol Calc (NIH): 47 mg/dL (ref 0–99)
Triglycerides: 184 mg/dL — ABNORMAL HIGH (ref 0–149)
VLDL Cholesterol Cal: 30 mg/dL (ref 5–40)

## 2019-06-02 ENCOUNTER — Ambulatory Visit: Payer: Medicare Other | Admitting: Cardiology

## 2019-06-06 ENCOUNTER — Other Ambulatory Visit: Payer: Self-pay | Admitting: Cardiology

## 2019-06-14 ENCOUNTER — Other Ambulatory Visit: Payer: Medicare Other

## 2019-06-14 ENCOUNTER — Other Ambulatory Visit: Payer: Self-pay

## 2019-06-14 DIAGNOSIS — I10 Essential (primary) hypertension: Secondary | ICD-10-CM

## 2019-06-15 LAB — CBC WITH DIFFERENTIAL/PLATELET
Basophils Absolute: 0.1 10*3/uL (ref 0.0–0.2)
Basos: 1 %
EOS (ABSOLUTE): 0.1 10*3/uL (ref 0.0–0.4)
Eos: 2 %
Hematocrit: 41.2 % (ref 37.5–51.0)
Hemoglobin: 14 g/dL (ref 13.0–17.7)
Immature Grans (Abs): 0 10*3/uL (ref 0.0–0.1)
Immature Granulocytes: 1 %
Lymphocytes Absolute: 1.9 10*3/uL (ref 0.7–3.1)
Lymphs: 29 %
MCH: 28.7 pg (ref 26.6–33.0)
MCHC: 34 g/dL (ref 31.5–35.7)
MCV: 85 fL (ref 79–97)
Monocytes Absolute: 0.6 10*3/uL (ref 0.1–0.9)
Monocytes: 9 %
Neutrophils Absolute: 3.8 10*3/uL (ref 1.4–7.0)
Neutrophils: 58 %
Platelets: 241 10*3/uL (ref 150–450)
RBC: 4.87 x10E6/uL (ref 4.14–5.80)
RDW: 16.3 % — ABNORMAL HIGH (ref 11.6–15.4)
WBC: 6.5 10*3/uL (ref 3.4–10.8)

## 2019-06-24 ENCOUNTER — Other Ambulatory Visit: Payer: Self-pay | Admitting: Family Medicine

## 2019-06-24 DIAGNOSIS — K219 Gastro-esophageal reflux disease without esophagitis: Secondary | ICD-10-CM

## 2019-06-24 NOTE — Telephone Encounter (Signed)
Is this okay to refill? 

## 2019-07-07 ENCOUNTER — Other Ambulatory Visit: Payer: Self-pay | Admitting: Cardiology

## 2019-07-07 DIAGNOSIS — E785 Hyperlipidemia, unspecified: Secondary | ICD-10-CM

## 2019-07-23 ENCOUNTER — Other Ambulatory Visit: Payer: Self-pay | Admitting: Cardiology

## 2019-07-23 DIAGNOSIS — I2510 Atherosclerotic heart disease of native coronary artery without angina pectoris: Secondary | ICD-10-CM

## 2019-07-27 ENCOUNTER — Ambulatory Visit: Payer: Medicare Other | Admitting: Family Medicine

## 2019-08-10 ENCOUNTER — Telehealth: Payer: Self-pay | Admitting: Internal Medicine

## 2019-08-10 ENCOUNTER — Ambulatory Visit (INDEPENDENT_AMBULATORY_CARE_PROVIDER_SITE_OTHER): Payer: Medicare Other | Admitting: Internal Medicine

## 2019-08-10 ENCOUNTER — Encounter: Payer: Self-pay | Admitting: Internal Medicine

## 2019-08-10 ENCOUNTER — Other Ambulatory Visit: Payer: Self-pay

## 2019-08-10 ENCOUNTER — Ambulatory Visit (INDEPENDENT_AMBULATORY_CARE_PROVIDER_SITE_OTHER): Payer: Medicare Other

## 2019-08-10 VITALS — BP 118/68 | HR 76 | Temp 98.0°F | Ht 67.0 in | Wt 166.6 lb

## 2019-08-10 DIAGNOSIS — K219 Gastro-esophageal reflux disease without esophagitis: Secondary | ICD-10-CM

## 2019-08-10 DIAGNOSIS — R05 Cough: Secondary | ICD-10-CM | POA: Diagnosis not present

## 2019-08-10 DIAGNOSIS — J301 Allergic rhinitis due to pollen: Secondary | ICD-10-CM | POA: Diagnosis not present

## 2019-08-10 DIAGNOSIS — J4520 Mild intermittent asthma, uncomplicated: Secondary | ICD-10-CM

## 2019-08-10 DIAGNOSIS — J849 Interstitial pulmonary disease, unspecified: Secondary | ICD-10-CM

## 2019-08-10 DIAGNOSIS — R053 Chronic cough: Secondary | ICD-10-CM

## 2019-08-10 MED ORDER — ANORO ELLIPTA 62.5-25 MCG/INH IN AEPB: 1.0000 | INHALATION_SPRAY | Freq: Every day | RESPIRATORY_TRACT | 0 refills | Status: DC

## 2019-08-10 NOTE — Progress Notes (Signed)
Patient ID: [name], male    DOB: [birth year], 74 y.o.   MRN: [id number]  HPI M never smoker followed for asthma, allergic rhinitis complicated by hx ADHD, CAD/ MI, GERD, anemia    --------------------------------------------------------------------------------  01/08/2019- 64 yo M never smoker followed for asthma, allergic rhinitis complicated by hx ADHD, CAD/ MI, GERD, anemia, back pain/spinal stimulator -----F/U for Chronic Asthma; pt states breathing is "pretty good" most of the time, reports DOE Anoro, ProAir, Singulair Denies cough or wheeze. DOE climbing hills- maybe not different. No acute. Cardiac status stable with no new events. Infrequent need for rescue inhaler with no sleep disturbance  08/10/19- 16 yo M never smoker followed for asthma, allergic rhinitis complicated by hx ADHD, CAD/ MI, GERD, anemia, back pain/spinal stimulator Anoro, ProAir, Singulair, zyrtec -----f/u Mild intermittent asthma without complication. Patient stated he has a cough .  Occasional Anoro and rescue inhaler. Unclear about purpose and hasn't noted that inhaler affect his breathing or  Persistent dry cough. No ACEI.[name]  Two friends have had pulmonary fibrosis, so he implies concern.  Mucinex not helpful. Admits much reflux in the past. Takes pantoprozole. Denies obvious aspiration event.  SOB climbing hills. Sometimes breathing feels raspy, but little phlegm.  Variable postnasal drip and throat clearing.   Review of Systems-see HPI + = positive Constitutional:   No-   weight loss, night sweats, fevers, chills, fatigue, lassitude. HEENT:   No-current  headaches, difficulty swallowing, tooth/dental problems, sore throat,       No-  sneezing, itching, ear ache, no-nasal congestion, post nasal drip,  CV:  No-chest pain, No- orthopnea, PND, swelling in lower extremities, anasarca, dizziness, palpitations Resp: +   shortness of breath with exertion or at rest.              No-   productive cough,    +non-productive cough,  No- coughing up of blood.              No-   change in color of mucus.  No- wheezing.   Skin: No-   rash or lesions. GI:  No-   heartburn, indigestion, abdominal pain, nausea, vomiting,  GU:  MS:  No-   joint pain or swelling. Neuro-     nothing unusual Psych:  No- change in mood or affect. No depression or anxiety.  No memory loss.     Objective:   Physical Exam General- Alert, Oriented, Affect-appropriate, Distress- none acute Skin- rash-none, lesions- none, excoriation- none Lymphadenopathy- none Head- atraumatic            Eyes- Gross vision intact, PERRLA, conjunctivae clear secretions            Ears- Hearing, canals-normal            Nose- Clear, no-Septal dev, mucus, polyps, erosion, perforation             Throat- Mallampati II , mucosa clear, drainage- none, tonsils- atrophic.  Neck- flexible , trachea midline, no stridor , thyroid nl, carotid no bruit Chest - symmetrical excursion , unlabored           Heart/CV- RRR , no murmur , no gallop  , no rub, nl s1 s2                           - JVD- none , edema- none, stasis changes- none, varices- none           Lung-+  minimal basilar crackle , good airflow,wheeze- none, cough- none , dullness-none, rub- none           Chest wall- + spinal stimulator Abd- Br/ Gen/ Rectal- Not done, not indicated Extrem- Normal apparent strength and mobility Neuro- grossly intact to observation

## 2019-08-10 NOTE — Patient Instructions (Signed)
Order- CXR   Dx chronic cough  Sample x 1 Anoro. Try using the Anoro regularly , one puff daily, for at least a week. See if you notice any improvement.   Pay special attention to preventing reflux. That may be the cause of your cough.  Please call if we can help

## 2019-08-10 NOTE — Assessment & Plan Note (Signed)
Discussed Flonase, antihistamines

## 2019-08-10 NOTE — Assessment & Plan Note (Addendum)
Complains more of dry cough than of wheeze. With hx reflux there is potential for microaspiration. Plan- CXR. Try using Anoro daily for a week.

## 2019-08-10 NOTE — Telephone Encounter (Signed)
Waymon Budge, MD  08/10/2019 3:45 PM EST    CXR shows what might be early pulmonary fibrosis. Order CT chest, High Resolution, ILD protocol, no contrast   Called and spoke with pt letting him know the results of the cxr and pt verbalized understanding. Order has been placed for the cxr. Nothing further needed.

## 2019-08-10 NOTE — Assessment & Plan Note (Signed)
Emphasize importance of reflux precautions. Continue acid blocker.

## 2019-08-17 ENCOUNTER — Encounter: Payer: Self-pay | Admitting: Family Medicine

## 2019-08-17 ENCOUNTER — Other Ambulatory Visit: Payer: Self-pay

## 2019-08-17 ENCOUNTER — Ambulatory Visit (INDEPENDENT_AMBULATORY_CARE_PROVIDER_SITE_OTHER): Payer: Medicare Other | Admitting: Family Medicine

## 2019-08-17 VITALS — BP 94/64 | HR 95 | Temp 97.8°F | Ht 66.5 in | Wt 161.4 lb

## 2019-08-17 DIAGNOSIS — N529 Male erectile dysfunction, unspecified: Secondary | ICD-10-CM

## 2019-08-17 DIAGNOSIS — M519 Unspecified thoracic, thoracolumbar and lumbosacral intervertebral disc disorder: Secondary | ICD-10-CM

## 2019-08-17 DIAGNOSIS — I10 Essential (primary) hypertension: Secondary | ICD-10-CM

## 2019-08-17 DIAGNOSIS — Z0000 Encounter for general adult medical examination without abnormal findings: Secondary | ICD-10-CM | POA: Diagnosis not present

## 2019-08-17 DIAGNOSIS — I25118 Atherosclerotic heart disease of native coronary artery with other forms of angina pectoris: Secondary | ICD-10-CM

## 2019-08-17 DIAGNOSIS — J301 Allergic rhinitis due to pollen: Secondary | ICD-10-CM

## 2019-08-17 DIAGNOSIS — H9113 Presbycusis, bilateral: Secondary | ICD-10-CM

## 2019-08-17 DIAGNOSIS — J4520 Mild intermittent asthma, uncomplicated: Secondary | ICD-10-CM | POA: Diagnosis not present

## 2019-08-17 DIAGNOSIS — M1990 Unspecified osteoarthritis, unspecified site: Secondary | ICD-10-CM

## 2019-08-17 DIAGNOSIS — Z87440 Personal history of urinary (tract) infections: Secondary | ICD-10-CM | POA: Diagnosis not present

## 2019-08-17 DIAGNOSIS — F418 Other specified anxiety disorders: Secondary | ICD-10-CM

## 2019-08-17 DIAGNOSIS — K219 Gastro-esophageal reflux disease without esophagitis: Secondary | ICD-10-CM

## 2019-08-17 DIAGNOSIS — F909 Attention-deficit hyperactivity disorder, unspecified type: Secondary | ICD-10-CM

## 2019-08-17 DIAGNOSIS — M450 Ankylosing spondylitis of multiple sites in spine: Secondary | ICD-10-CM

## 2019-08-17 DIAGNOSIS — E782 Mixed hyperlipidemia: Secondary | ICD-10-CM

## 2019-08-17 DIAGNOSIS — M4316 Spondylolisthesis, lumbar region: Secondary | ICD-10-CM

## 2019-08-17 LAB — POCT URINALYSIS DIP (PROADVANTAGE DEVICE)
Bilirubin, UA: NEGATIVE
Blood, UA: NEGATIVE
Glucose, UA: NEGATIVE mg/dL
Ketones, POC UA: NEGATIVE mg/dL
Leukocytes, UA: NEGATIVE
Nitrite, UA: NEGATIVE
Protein Ur, POC: NEGATIVE mg/dL
Specific Gravity, Urine: 1.015
Urobilinogen, Ur: 0.2
pH, UA: 6 (ref 5.0–8.0)

## 2019-08-17 MED ORDER — TADALAFIL 10 MG PO TABS: 10.0000 mg | ORAL_TABLET | Freq: Every day | ORAL | 5 refills | Status: DC | PRN

## 2019-08-17 NOTE — Patient Instructions (Signed)
  [name] , Thank you for taking time to come for your Medicare Wellness Visit. I appreciate your ongoing commitment to your health goals. Please review the following plan we discussed and let me know if I can assist you in the future.   These are the goals we discussed: Continue on your present medications but get involved in a regular exercise program. This is a list of the screening recommended for you and due dates:  Health Maintenance  Topic Date Due  . Colon Cancer Screening  10/21/2023  . Tetanus Vaccine  02/27/2027  . Flu Shot  Completed  .  Hepatitis C: One time screening is recommended by Center for Disease Control  (CDC) for  adults born from [birth year] through 1965.   Completed  . Pneumonia vaccines  Completed

## 2019-08-17 NOTE — Progress Notes (Signed)
[name] is a 74 y.o. male who presents for annual wellness visit,CPE and follow-up on chronic medical condi weight he is doing well on his present medication regimen.  Tions.  He has no particular concerns or complaints.  He continues to be followed for his underlying lung disease by Dr. Annamaria Boots and has a CT scan scheduled to further evaluate a lung issue.  He is followed by cardiology and is having no chest pain, shortness of breath or PND.  He had a UTI recently and at the present time is having no urinary symptoms.  Psychologic he is doing well on his present medication regimen.  He does occasionally use Xanax to help with anxiety but notes that this is getting less of a concern.  His ADD is under good control with Concerta.  He has been seen by neurosurgery in the past and is doing well with his chronic back pain.  He does have a stimulator in place.  His allergies seem to be under good control.  He continues to wear his hearing aids and does note a definite improvement in his hearing.  He continues on atorvastatin and having no aches or pains with that.  Reflux causes very little difficulty and he does use Protonix.  He does have some difficulty with erectile dysfunction and would like a refill on his Cialis.  Immunizations and Health Maintenance Immunization History  Administered Date(s) Administered  . Influenza Split 04/23/2011  . Influenza Whole 03/20/2009, 03/19/2010  . Influenza, High Dose Seasonal PF 04/12/2016, 02/26/2017, 04/02/2018, 01/30/2019, 01/30/2019  . Influenza,inj,Quad PF,6+ Mos 03/05/2013, 02/23/2014, 04/21/2015  . Influenza-Unspecified 04/02/2018, 01/30/2019  . PFIZER SARS-COV-2 Vaccination 07/02/2019, 07/23/2019  . Pneumococcal Conjugate-13 03/04/2014  . Pneumococcal Polysaccharide-23 06/10/2005, 05/19/2012  . Td 10/20/2006  . Tdap 10/20/2006, 02/26/2017  . Zoster 01/01/2007  . Zoster Recombinat (Shingrix) 12/10/2016, 07/15/2017   There are no preventive care  reminders to display for this patient.  Last colonoscopy:03/31/2006 Last PSA: 07/23/2013 Dentist: 08/18/19 Ophtho: 04/2019 Exercise: not much  Other doctors caring for patient include: Dr. Annamaria Boots pulmonology, [place] [facility]  Advanced Directives: not on file copy asked for Does Patient Have a Medical Advance Directive?: Yes Type of Advance Directive: [place] will Does patient want to make changes to medical advance directive?: No - Patient declined Copy of [place] in Chart?: No - copy requested  Depression screen:  See questionnaire below.     Depression screen [facility] 2/9 08/17/2019 04/23/2018 02/02/2018 10/15/2016 12/13/2015  Decreased Interest 0 0 0 0 0  Down, Depressed, Hopeless 0 0 0 0 0  PHQ - 2 Score 0 0 0 0 0  Some recent data might be hidden    Fall Screen: See Questionaire below.   Fall Risk  08/17/2019 04/23/2018 02/02/2018 10/15/2016 12/13/2015  Falls in the past year? 1 0 No No No  Number falls in past yr: 1 - - - -  Injury with Fall? 0 - - - -    ADL screen:  See questionnaire below.  Functional Status Survey: Is the patient deaf or have difficulty hearing?: Yes(Hearing aide) Does the patient have difficulty seeing, even when wearing glasses/contacts?: No Does the patient have difficulty concentrating, remembering, or making decisions?: No Does the patient have difficulty walking or climbing stairs?: No Does the patient have difficulty dressing or bathing?: No Does the patient have difficulty doing errands alone such as visiting a doctor's office or shopping?: No   Review of Systems  Constitutional: -, -unexpected weight change, -anorexia, -fatigue Allergy: -sneezing, -itching, -congestion Dermatology: denies changing moles, rash, lumps ENT: -runny nose, -ear pain, -sore throat,  Cardiology:  -chest pain, -palpitations, -orthopnea, Respiratory: -cough, -shortness of breath, -dyspnea on exertion, -wheezing,   Gastroenterology: -abdominal pain, -nausea, -vomiting, -diarrhea, -constipation, -dysphagia Hematology: -bleeding or bruising problems Musculoskeletal:-myalgias, -joint swelling, Ophthalmology: -vision changes,  Urology: -dysuria, -difficulty urinating,  -urinary frequency, -urgency, incontinence Neurology: -, -numbness, , -memory loss, -falls, -dizziness    PHYSICAL EXAM:  BP 94/64 (BP Location: Left Arm, Patient Position: Sitting) Comment (BP Location): left  Pulse 95   Temp 97.8 F (36.6 C)   Ht 5' 6.5" (1.689 m)   Wt 161 lb 6.4 oz (73.2 kg)   SpO2 98%   BMI 25.66 kg/m   General Appearance: Alert, cooperative, no distress, appears stated age Head: Normocephalic, without obvious abnormality, atraumatic Eyes: PERRL, conjunctiva/corneas clear, EOM's intact,Ears: Normal TM's and external ear canals Nose: Nares normal, mucosa normal, no drainage or sinus   tenderness Throat: Lips, mucosa, and tongue normal; teeth and gums normal Neck: Supple, no lymphadenopathy, thyroid:no enlargement/tenderness/nodules; no carotid bruit or JVD Lungs: Clear to auscultation bilaterally without wheezes, rales or ronchi; respirations unlabored Heart: Regular rate and rhythm, S1 and S2 normal, no murmur, rub or gallop Abdomen: Soft, non-tender, nondistended, normoactive bowel sounds, no masses, no hepatosplenomegaly Extremities: No clubbing, cyanosis or edema Pulses: 2+ and symmetric all extremities Skin: Skin color, texture, turgor normal, no rashes or lesions Lymph nodes: Cervical, supraclavicular, and axillary nodes normal Neurologic: CNII-XII intact, normal strength, sensation and gait; reflexes 2+ and symmetric throughout   Psych: Normal mood, affect, hygiene and grooming Recent blood work was reviewed. ASSESSMENT/PLAN: Routine general medical examination at a health care facility  Erectile dysfunction, unspecified erectile dysfunction type - Plan: tadalafil (CIALIS) 10 MG tablet  History of  UTI - Plan: POCT Urinalysis DIP (Proadvantage Device)  Presbycusis of both ears  Mild intermittent asthma without complication  Spondylolisthesis of lumbar region  Lumbar disc disease  Depression with anxiety  Gastroesophageal reflux disease, unspecified whether esophagitis present  Coronary artery disease of native artery of native heart with stable angina pectoris (HCC)  Arthritis  Ankylosing spondylitis of multiple sites in spine (HCC)  Allergic rhinitis due to pollen, unspecified seasonality  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  Very stable on his psych medications.  Did definitely encourage him to become more physically active as he has allowed Covid to interfere with his physical activities.   recommended at least 30 minutes of aerobic activity at least 5 days/week; . Immunization recommendations discussed.  Colonoscopy recommendations reviewed.   Medicare Attestation I have personally reviewed: The patient's medical and social history Their use of alcohol, tobacco or illicit drugs Their current medications and supplements The patient's functional ability including ADLs,fall risks, home safety risks, cognitive, and hearing and visual impairment Diet and physical activities Evidence for depression or mood disorders  The patient's weight, height, and BMI have been recorded in the chart.  I have made referrals, counseling, and provided education to the patient based on review of the above and I have provided the patient with a written personalized care plan for preventive services.     Sharlot Gowda, MD   08/17/2019

## 2019-08-19 ENCOUNTER — Telehealth: Payer: Self-pay | Admitting: Internal Medicine

## 2019-08-19 DIAGNOSIS — J45909 Unspecified asthma, uncomplicated: Secondary | ICD-10-CM

## 2019-08-19 MED ORDER — ANORO ELLIPTA 62.5-25 MCG/INH IN AEPB: 1.0000 | INHALATION_SPRAY | Freq: Every day | RESPIRATORY_TRACT | 6 refills | Status: DC

## 2019-08-19 NOTE — Telephone Encounter (Signed)
FYI: Spoke with pt and he states that sample of Anoro really helped and requested rx to pharmacy. Anoro rx sent. Pt verbalized understanding.

## 2019-08-20 ENCOUNTER — Other Ambulatory Visit: Payer: Self-pay

## 2019-08-20 ENCOUNTER — Ambulatory Visit (INDEPENDENT_AMBULATORY_CARE_PROVIDER_SITE_OTHER)
Admission: RE | Admit: 2019-08-20 | Discharge: 2019-08-20 | Disposition: A | Discharge status: Home / Self Care | Payer: Medicare Other | Source: Ambulatory Visit | Attending: Internal Medicine | Admitting: Internal Medicine

## 2019-08-20 ENCOUNTER — Telehealth: Payer: Self-pay | Admitting: Internal Medicine

## 2019-08-20 DIAGNOSIS — J849 Interstitial pulmonary disease, unspecified: Secondary | ICD-10-CM | POA: Diagnosis not present

## 2019-08-20 NOTE — Telephone Encounter (Signed)
Saw that Dr. Maple Hudson had sent result note of pt's CT to triage. Called and spoke with pt in regards to the results of the CT and pt verbalized understanding. Nothing further needed.

## 2019-08-20 NOTE — Telephone Encounter (Signed)
Pt calling requesting to know about the CT results which he had done today 3/12. Dr. Maple Hudson, please advise on this for pt.

## 2019-08-30 ENCOUNTER — Ambulatory Visit (INDEPENDENT_AMBULATORY_CARE_PROVIDER_SITE_OTHER): Payer: Medicare Other | Admitting: Family Medicine

## 2019-08-30 ENCOUNTER — Other Ambulatory Visit: Payer: Self-pay

## 2019-08-30 ENCOUNTER — Encounter: Payer: Self-pay | Admitting: Family Medicine

## 2019-08-30 VITALS — BP 102/64 | HR 75 | Temp 98.1°F | Wt 166.2 lb

## 2019-08-30 DIAGNOSIS — R140 Abdominal distension (gaseous): Secondary | ICD-10-CM

## 2019-08-30 DIAGNOSIS — Z0000 Encounter for general adult medical examination without abnormal findings: Secondary | ICD-10-CM

## 2019-08-30 NOTE — Progress Notes (Signed)
   Subjective:    Patient ID: [name], male    DOB: 1945-10-05, 74 y.o.   MRN: [id number]  HPI He is here for evaluation of possible abdominal hernia.  He noted this yesterday but is having no pain from it.   Review of Systems     Objective:   Physical Exam Alert and in no distress exam of the abdomen does show eventration centrally.  No true hernia noted.       Assessment & Plan:  Normal exam I explained that what he is feeling is actually stretching of the linea alba and not unusual.  He was comfortable with that.

## 2019-09-19 ENCOUNTER — Other Ambulatory Visit: Payer: Self-pay | Admitting: Family Medicine

## 2019-09-19 DIAGNOSIS — J301 Allergic rhinitis due to pollen: Secondary | ICD-10-CM

## 2019-09-19 DIAGNOSIS — J4530 Mild persistent asthma, uncomplicated: Secondary | ICD-10-CM

## 2019-10-06 ENCOUNTER — Other Ambulatory Visit: Payer: Self-pay | Admitting: Cardiology

## 2019-10-06 DIAGNOSIS — E785 Hyperlipidemia, unspecified: Secondary | ICD-10-CM

## 2019-10-07 ENCOUNTER — Telehealth: Payer: Self-pay | Admitting: Family Medicine

## 2019-10-07 MED ORDER — METHYLPHENIDATE HCL ER (OSM) 36 MG PO TBCR: 36.0000 mg | EXTENDED_RELEASE_TABLET | Freq: Every day | ORAL | 0 refills | Status: DC

## 2019-10-07 NOTE — Telephone Encounter (Signed)
Pt called and is requesting a refill on his concerta please send to the [id number] - [place], [place] - [address]

## 2019-12-01 ENCOUNTER — Telehealth: Payer: Self-pay | Admitting: Family Medicine

## 2019-12-01 MED ORDER — METHYLPHENIDATE HCL ER (OSM) 36 MG PO TBCR: 36.0000 mg | EXTENDED_RELEASE_TABLET | Freq: Every day | ORAL | 0 refills | Status: DC

## 2019-12-01 MED ORDER — METHYLPHENIDATE HCL ER (OSM) 36 MG PO TBCR
36.0000 mg | EXTENDED_RELEASE_TABLET | Freq: Every day | ORAL | 0 refills | Status: DC
Start: 2020-01-31 — End: 2020-03-28

## 2019-12-01 MED ORDER — METHYLPHENIDATE HCL ER (OSM) 36 MG PO TBCR
36.0000 mg | EXTENDED_RELEASE_TABLET | Freq: Every day | ORAL | 0 refills | Status: DC
Start: 2019-12-31 — End: 2020-03-28

## 2019-12-01 NOTE — Telephone Encounter (Signed)
Pt req refill generic [facility] to [facility] [address]

## 2019-12-27 ENCOUNTER — Other Ambulatory Visit: Payer: Self-pay | Admitting: Family Medicine

## 2019-12-27 NOTE — Telephone Encounter (Signed)
[facility] please advise Kh

## 2020-01-01 ENCOUNTER — Other Ambulatory Visit: Payer: Self-pay | Admitting: Cardiology

## 2020-01-01 DIAGNOSIS — I2510 Atherosclerotic heart disease of native coronary artery without angina pectoris: Secondary | ICD-10-CM

## 2020-01-10 ENCOUNTER — Ambulatory Visit: Payer: Medicare Other | Admitting: Internal Medicine

## 2020-03-08 ENCOUNTER — Telehealth: Payer: Self-pay | Admitting: Family Medicine

## 2020-03-08 MED ORDER — TADALAFIL 20 MG PO TABS: 20.0000 mg | ORAL_TABLET | Freq: Every day | ORAL | 1 refills | Status: DC | PRN

## 2020-03-08 NOTE — Telephone Encounter (Signed)
Pt called and is requesting a refill on his tadalafi pt would like 20 mg states the 10mg  is not enough pt would like it sent to the - [place], [place] - [address]

## 2020-03-08 NOTE — Telephone Encounter (Signed)
Let him know that I called the medication in but if the cost is too much, I can call it into [facility] and he can use good Rx

## 2020-03-09 NOTE — Telephone Encounter (Signed)
Pt. Aware that medication has been sent in. 

## 2020-03-19 ENCOUNTER — Other Ambulatory Visit: Payer: Self-pay | Admitting: Family Medicine

## 2020-03-19 DIAGNOSIS — F418 Other specified anxiety disorders: Secondary | ICD-10-CM

## 2020-03-20 NOTE — Telephone Encounter (Signed)
[facility] is requesting to fill pt wellbutin and pristiq please advise [facility]

## 2020-03-22 ENCOUNTER — Other Ambulatory Visit: Payer: Self-pay | Admitting: Cardiology

## 2020-03-22 DIAGNOSIS — I2510 Atherosclerotic heart disease of native coronary artery without angina pectoris: Secondary | ICD-10-CM

## 2020-03-28 ENCOUNTER — Telehealth: Payer: Self-pay

## 2020-03-28 MED ORDER — METHYLPHENIDATE HCL ER (OSM) 36 MG PO TBCR: 36.0000 mg | EXTENDED_RELEASE_TABLET | Freq: Every day | ORAL | 0 refills | Status: DC

## 2020-03-28 MED ORDER — METHYLPHENIDATE HCL ER (OSM) 36 MG PO TBCR
36.0000 mg | EXTENDED_RELEASE_TABLET | Freq: Every day | ORAL | 0 refills | Status: DC
Start: 2020-03-28 — End: 2020-06-21

## 2020-03-28 MED ORDER — METHYLPHENIDATE HCL ER (OSM) 36 MG PO TBCR
36.0000 mg | EXTENDED_RELEASE_TABLET | Freq: Every day | ORAL | 0 refills | Status: DC
Start: 2020-04-28 — End: 2020-06-21

## 2020-03-28 NOTE — Telephone Encounter (Signed)
Pt. Called stating that he needs a refill on his Concerta and pt. Last apt was 08/30/19.

## 2020-04-06 ENCOUNTER — Telehealth: Payer: Self-pay | Admitting: Internal Medicine

## 2020-04-06 NOTE — Telephone Encounter (Signed)
Lm for patient.  

## 2020-04-07 ENCOUNTER — Other Ambulatory Visit: Payer: Self-pay | Admitting: Cardiology

## 2020-04-07 DIAGNOSIS — I2510 Atherosclerotic heart disease of native coronary artery without angina pectoris: Secondary | ICD-10-CM

## 2020-04-07 NOTE — Telephone Encounter (Signed)
Tried calling the pt and there was no answer- LMTCB.  

## 2020-04-10 NOTE — Telephone Encounter (Signed)
LMTCB and closing msg since no call back

## 2020-04-11 ENCOUNTER — Telehealth: Payer: Self-pay | Admitting: Internal Medicine

## 2020-04-11 NOTE — Telephone Encounter (Signed)
LMTCB x1 for pt.  

## 2020-04-13 NOTE — Telephone Encounter (Signed)
lmtcb for pt.  

## 2020-04-14 MED ORDER — PREDNISONE 20 MG PO TABS: 20.0000 mg | ORAL_TABLET | Freq: Every day | ORAL | 0 refills | Status: DC

## 2020-04-14 NOTE — Telephone Encounter (Signed)
Spoke to patient, who stated that he was exposed to smoke 3 weeks ago.  Since being exposed he has had voice hoarseness, chest tightness and cough. Cough is occ productive with white mucus.  Denied, fever, chills, sweats or additional sx. Taking anoro daily and albuterol HFA 2-3x daily with some relief in sx.  Dr. Maple Hudson, please advise. Thanks  Current Outpatient Medications on File Prior to Visit  Medication Sig Dispense Refill   ALPRAZolam (XANAX) 0.25 MG tablet TAKE 1 TABLET BY MOUTH TWICE A DAY AS NEEDED 30 tablet 0   amoxicillin (AMOXIL) 500 MG tablet TAKE 4 TABLETS BY MOUTH 1 HOUR BEFORE DENTAL APPTS     aspirin EC 81 MG tablet Take 81 mg by mouth daily.     atorvastatin (LIPITOR) 80 MG tablet TAKE 1 TABLET BY MOUTH  DAILY 90 tablet 3   azelastine (ASTELIN) 0.1 % nasal spray Place 2 sprays into both nostrils 2 (two) times daily. Use in each nostril as directed 30 mL 12   buPROPion (WELLBUTRIN XL) 300 MG 24 hr tablet TAKE 1 TABLET BY MOUTH  DAILY 90 tablet 1   calcium carbonate (OS-CAL) 600 MG TABS tablet Take 600 mg by mouth daily with breakfast.     cetirizine (ZYRTEC) 10 MG tablet Take 10 mg by mouth daily.       clopidogrel (PLAVIX) 75 MG tablet TAKE 1 TABLET BY MOUTH  DAILY 90 tablet 3   Coenzyme Q10 (COQ10) 200 MG CAPS Take 1 capsule by mouth daily.     desvenlafaxine (PRISTIQ) 50 MG 24 hr tablet TAKE 1 TABLET BY MOUTH  DAILY 90 tablet 1   diltiazem (CARDIZEM CD) 240 MG 24 hr capsule TAKE 1 CAPSULE BY MOUTH  DAILY 90 capsule 3   ferrous sulfate 325 (65 FE) MG tablet Take 325 mg by mouth daily with breakfast.     Flaxseed, Linseed, (FLAX SEED OIL) 1000 MG CAPS Take 1 capsule by mouth daily.     fluocinonide (LIDEX) 0.05 % external solution APPLY 2X DAILY TO AFFECTED AREAS ON SCALP UP TO 2 WEEKS AS NEEFED FOR FLARES. AVOID FACE     GLUCOSAMINE CHONDROITIN COMPLX PO Take 1 tablet by mouth daily.     guaiFENesin (MUCINEX) 600 MG 12 hr tablet Take 600 mg by mouth 2  (two) times daily as needed for cough.      ketoconazole (NIZORAL) 2 % shampoo 3 TIMES A WEEK LATHER ON SCALP, LEAVE ON 5 8 MINUTES, RINSE WELL     meclizine (ANTIVERT) 25 MG tablet 1 twice a day as needed for dizziness 180 tablet 0   [START ON 05/28/2020] methylphenidate (CONCERTA) 36 MG PO CR tablet Take 1 tablet (36 mg total) by mouth daily. 30 tablet 0   [START ON 04/28/2020] methylphenidate (CONCERTA) 36 MG PO CR tablet Take 1 tablet (36 mg total) by mouth daily. 30 tablet 0   methylphenidate (CONCERTA) 36 MG PO CR tablet Take 1 tablet (36 mg total) by mouth daily. 30 tablet 0   montelukast (SINGULAIR) 10 MG tablet TAKE 1 TABLET BY MOUTH AT  BEDTIME 90 tablet 3   Multiple Vitamins-Minerals (MULTIVITAMIN WITH MINERALS) tablet Take 1 tablet by mouth daily.     nitroGLYCERIN (NITROSTAT) 0.4 MG SL tablet Place 1 tablet (0.4 mg total) under the tongue every 5 (five) minutes as needed. 25 tablet 11   pantoprazole (PROTONIX) 40 MG tablet TAKE 1 TABLET BY MOUTH EVERY DAY 90 tablet 3   PROAIR HFA 108 (90 Base)  MCG/ACT inhaler TAKE 2 PUFFS BY MOUTH EVERY 4 HOURS AS NEEDED 8.5 Inhaler 2   QUERCETIN PO Take 500 mg by mouth daily.     tadalafil (CIALIS) 20 MG tablet Take 1 tablet (20 mg total) by mouth daily as needed for erectile dysfunction. 30 tablet 1   umeclidinium-vilanterol (ANORO ELLIPTA) 62.5-25 MCG/INH AEPB Inhale 1 puff into the lungs daily. 5 each 6   No current facility-administered medications on file prior to visit.    No Known Allergies

## 2020-04-14 NOTE — Telephone Encounter (Signed)
Called and left VM on cell #. Called home # and spoke with patient, advised on Dr. Roxy Cedar recommendations.  Script sent to pharmacy after verifying pharmacy.  Nothing further needed.

## 2020-04-14 NOTE — Telephone Encounter (Signed)
If he is still using Anoro, which is on his med list, then suggest we offer  Prednsione 20 mg, # 5, 1 daily x 5 days

## 2020-05-12 ENCOUNTER — Other Ambulatory Visit: Payer: Self-pay | Admitting: Family Medicine

## 2020-05-12 DIAGNOSIS — F419 Anxiety disorder, unspecified: Secondary | ICD-10-CM | POA: Insufficient documentation

## 2020-05-12 DIAGNOSIS — F32A Depression, unspecified: Secondary | ICD-10-CM | POA: Insufficient documentation

## 2020-05-12 DIAGNOSIS — G4700 Insomnia, unspecified: Secondary | ICD-10-CM | POA: Insufficient documentation

## 2020-05-12 DIAGNOSIS — J189 Pneumonia, unspecified organism: Secondary | ICD-10-CM | POA: Insufficient documentation

## 2020-05-12 DIAGNOSIS — I517 Cardiomegaly: Secondary | ICD-10-CM | POA: Insufficient documentation

## 2020-05-12 DIAGNOSIS — N529 Male erectile dysfunction, unspecified: Secondary | ICD-10-CM | POA: Insufficient documentation

## 2020-05-12 DIAGNOSIS — J449 Chronic obstructive pulmonary disease, unspecified: Secondary | ICD-10-CM | POA: Insufficient documentation

## 2020-05-12 DIAGNOSIS — F988 Other specified behavioral and emotional disorders with onset usually occurring in childhood and adolescence: Secondary | ICD-10-CM | POA: Insufficient documentation

## 2020-05-12 DIAGNOSIS — D649 Anemia, unspecified: Secondary | ICD-10-CM | POA: Insufficient documentation

## 2020-05-12 DIAGNOSIS — E291 Testicular hypofunction: Secondary | ICD-10-CM | POA: Insufficient documentation

## 2020-05-12 DIAGNOSIS — I209 Angina pectoris, unspecified: Secondary | ICD-10-CM | POA: Insufficient documentation

## 2020-05-12 DIAGNOSIS — Z87442 Personal history of urinary calculi: Secondary | ICD-10-CM | POA: Insufficient documentation

## 2020-05-12 NOTE — Telephone Encounter (Signed)
[facility] Please advise [facility] 

## 2020-05-23 ENCOUNTER — Telehealth: Payer: Self-pay | Admitting: Family Medicine

## 2020-05-23 NOTE — Telephone Encounter (Signed)
Pt advised.

## 2020-05-23 NOTE — Telephone Encounter (Signed)
If he has had his shots he does not need to do anything unless he develops symptoms.

## 2020-05-23 NOTE — Telephone Encounter (Signed)
[name] called and said a repair man that was at their house on Saturday tested positive for Covid. He is not having any symptoms but wants to know what should he do

## 2020-05-25 NOTE — Progress Notes (Deleted)
Cardiology Office Note:    Date:  05/26/2020   ID:  [name], DOB [birth year], MRN [id number]  PCP:  Ronnald Nian, MD  Cardiologist:  Norman Herrlich, MD    Referring MD: Ronnald Nian, MD    ASSESSMENT:    1. Coronary artery disease of native artery of native heart with stable angina pectoris (HCC)   2. Left ventricular dysfunction   3. Essential hypertension   4. Mixed hyperlipidemia    PLAN:    In order of problems listed above:  1. ***   Next appointment: ***   Medication Adjustments/Labs and Tests Ordered: Current medicines are reviewed at length with the patient today.  Concerns regarding medicines are outlined above.  No orders of the defined types were placed in this encounter.  No orders of the defined types were placed in this encounter.  Chief complaint: Yearly follow-up for CAD with PCI most recently 1999 and mild LV dysfunction with subsequent normalization of ejection fraction.  History of Present Illness:    [name] is a 74 y.o. male with a hx of CADwith POBA 1995 and subsequent  PCI of RCA in 1999 and  EF 40% -45%, hypertension and hyperlipdemia  Review of records catheterization 2009 patent stent right coronary artery 60% proximal LAD stenosis,  90% proximal first diagonal stenosis and 50% proximal right coronary artery stenosis  He was last seen 05/26/2019.  He has a history of asthma and Dr. Jetty Duhamel in the office. Compliance with diet, lifestyle and medications: ***  He had a high-resolution CT of the chest performed 08/20/2019 showing no findings of interstitial lung disease he did have aortic atherosclerosis and coronary artery and aortic valve calcification.  Echocardiogram that was performed 11/11/2018 showed mild calcification of the aortic valve and no stenosis.  He also had mild enlargement ascending aorta 38 mm by echo was normal by CT.  His echocardiogram at that time showed a low normal ejection fraction of 50 to  55% and normal diastolic filling pressure. Past Medical History:  Diagnosis Date  . ADD (attention deficit disorder)   . Allergic rhinitis    skin test 02/23/08 vaccine 2009; allergy shots weekly through Dr. Maple Hudson  . Anemia    takes Fe -   . Anginal pain (HCC)   . Ankylosing spondylitis (HCC)   . Anxiety    sees therapist Vernell Leep  . Arthritis   . Asthma    Dr. Jetty Duhamel  . 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 CAD (coronary artery disease)    Dr. Donnie Aho  . COPD (chronic obstructive pulmonary disease) (HCC)   . Depression   . Depression with anxiety   . Erectile dysfunction   . GERD (gastroesophageal reflux disease)   . History of benign positional vertigo   . History of kidney stones   . Hyperlipidemia   . Hypertension   . Hypogonadism male    prior use on Testim  . Insomnia    prior borderline sleep study  . Left ventricular dysfunction 10/12/2018  . Lumbar disc disease   . Mild concentric left ventricular hypertrophy (LVH)   . 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 Pneumonia   . Shortness of breath dyspnea     Past Surgical History:  Procedure Laterality Date  . BRAVO PH STUDY  02/18/2012   Procedure: BRAVO PH STUDY;  Surgeon: Florencia Reasons, MD;  Location: [facility];  Service: Endoscopy;  Laterality: N/A;  .  CARDIAC CATHETERIZATION  2006   Dr. Donnie Aho  . CHEILECTOMY  10/16/2011   Procedure: CHEILECTOMY;  Surgeon: Sherri Rad, MD;  Location: [facility];  Service: Orthopedics;  Laterality: Right;  . CHOLECYSTECTOMY    . COLONOSCOPY    . CORONARY ANGIOPLASTY     1995 after MI  . CORONARY ANGIOPLASTY WITH STENT PLACEMENT     1999   . ESOPHAGOGASTRODUODENOSCOPY  02/18/2012   Procedure: ESOPHAGOGASTRODUODENOSCOPY (EGD);  Surgeon: Florencia Reasons, MD;  Location: [facility];  Service: Endoscopy;  Laterality: N/A;  . FOOT SURGERY     joint scraped, arthritis right foot  . LUMBAR DISC SURGERY     x2  . right  index finger  mass removed- arthritis     Dr. Amanda Pea  . SPINAL CORD STIMULATOR INSERTION N/A 11/21/2016   Procedure: LUMBAR SPINAL CORD STIMULATOR INSERTION;  Surgeon: Venita Lick, MD;  Location: [facility];  Service: Orthopedics;  Laterality: N/A;  Requests 2.5 hrs  . TONSILLECTOMY     1953  . UPPER GI ENDOSCOPY  [id number]  . VASECTOMY     1980    Current Medications: No outpatient medications have been marked as taking for the 05/26/20 encounter (Appointment) with Baldo Daub, MD.     Allergies:   Patient has no known allergies.   Social History   Socioeconomic History  . Marital status: Married    Spouse name: Not on file  . Number of children: Y  . Years of education: Not on file  . Highest education level: Not on file  Occupational History  . Occupation: Chief of Staff  . Occupation: retired- Chiropractor  Tobacco Use  . Smoking status: Never Smoker  . Smokeless tobacco: Never Used  Vaping Use  . Vaping Use: Never used  Substance and Sexual Activity  . Alcohol use: Yes    Comment: drinks a glass of wine once a month   . Drug use: No  . Sexual activity: Not Currently  Other Topics Concern  . Not on file  Social History Narrative   Married, has 2 children, 38yo son, 35yo daughter with fibromyalgia, chronic fatigue syndrome, former Metallurgist.   Artist (paints)   Social Determinants of Health   Financial Resource Strain: Not on file  Food Insecurity: Not on file  Transportation Needs: Not on file  Physical Activity: Not on file  Stress: Not on file  Social Connections: Not on file     Family History: The patient's ***family history includes Arthritis in his sister; Chronic fatigue in his daughter; Dementia in his father and mother; Diabetes in his mother; Heart disease in his father, mother, and sister. There is no history of Cancer. ROS:   Please see the history of present illness.    All other systems reviewed and are  negative.  EKGs/Labs/Other Studies Reviewed:    The following studies were reviewed today:  EKG:  EKG ordered today and personally reviewed.  The ekg ordered today demonstrates ***  Recent Labs: 06/14/2019: Hemoglobin 14.0; Platelets 241  Recent Lipid Panel    Component Value Date/Time   CHOL 122 05/26/2019 1008   TRIG 184 (H) 05/26/2019 1008   HDL 45 05/26/2019 1008   CHOLHDL 2.7 05/26/2019 1008   CHOLHDL 3.4 10/15/2016 1056   VLDL 33 (H) 10/15/2016 1056   LDLCALC 47 05/26/2019 1008    Physical Exam:    VS:  There were no vitals taken for this visit.  Wt Readings from Last 3 Encounters:  08/30/19 166 lb 3.2 oz (75.4 kg)  08/17/19 161 lb 6.4 oz (73.2 kg)  08/10/19 166 lb 9.6 oz (75.6 kg)     GEN: *** Well nourished, well developed in no acute distress HEENT: Normal NECK: No JVD; No carotid bruits LYMPHATICS: No lymphadenopathy CARDIAC: ***RRR, no murmurs, rubs, gallops RESPIRATORY:  Clear to auscultation without rales, wheezing or rhonchi  ABDOMEN: Soft, non-tender, non-distended MUSCULOSKELETAL:  No edema; No deformity  SKIN: Warm and dry NEUROLOGIC:  Alert and oriented x 3 PSYCHIATRIC:  Normal affect    Signed, Norman Herrlich, MD  05/26/2020 10:46 AM    [facility]

## 2020-05-26 ENCOUNTER — Ambulatory Visit: Payer: Medicare Other | Admitting: Cardiology

## 2020-05-26 ENCOUNTER — Other Ambulatory Visit: Payer: Self-pay

## 2020-06-19 ENCOUNTER — Telehealth: Payer: Self-pay | Admitting: Cardiology

## 2020-06-19 NOTE — Telephone Encounter (Signed)
Patient went to [facility] in [place] over the weekend because he has had a lot of acid reflux. The patient had an echocardiogram done while he was there and they were concerned about the results. The Urgent Care wanted the patient to be seen by Dr. Dulce Sellar ASAP. The patient would like to be worked in

## 2020-06-19 NOTE — Telephone Encounter (Signed)
Spoke to the patient just now and let him know that Dr. Dulce Sellar would like for him to drop this EKG off today for someone to review so that if it is urgent we can direct him to where he needs to go/do. Patient verbalizes understanding and states that he will drop this off at this office in just a couple of minutes.

## 2020-06-19 NOTE — Telephone Encounter (Signed)
Please get the EKG and show to DOD  If abnormal needs to be seen urgently  If not add on Weds with me

## 2020-06-19 NOTE — Telephone Encounter (Signed)
Follow Up:    Pt wants to know if there is anyway he can be worked in with Dr Dulce Sellar tomorrow?

## 2020-06-19 NOTE — Telephone Encounter (Signed)
This was discussed with patient through MyChart message

## 2020-06-20 NOTE — Telephone Encounter (Signed)
Tried to cal patient 2x the phone answers and then hangs up.

## 2020-06-20 NOTE — Telephone Encounter (Signed)
[name] is calling to see if he is needing to been sooner than his scheduled date due to his EKG results. I advised him there is not a sooner appt available with Dr. Dulce Sellar in [facility], but that I would send a message for a nurse to call him back in regards to it. Please advise.

## 2020-06-21 ENCOUNTER — Ambulatory Visit (INDEPENDENT_AMBULATORY_CARE_PROVIDER_SITE_OTHER): Payer: Medicare Other | Admitting: Cardiology

## 2020-06-21 ENCOUNTER — Other Ambulatory Visit: Payer: Self-pay

## 2020-06-21 ENCOUNTER — Encounter: Payer: Self-pay | Admitting: Cardiology

## 2020-06-21 VITALS — BP 112/68 | HR 90 | Ht 66.5 in | Wt 171.8 lb

## 2020-06-21 DIAGNOSIS — K219 Gastro-esophageal reflux disease without esophagitis: Secondary | ICD-10-CM

## 2020-06-21 DIAGNOSIS — I25118 Atherosclerotic heart disease of native coronary artery with other forms of angina pectoris: Secondary | ICD-10-CM | POA: Diagnosis not present

## 2020-06-21 DIAGNOSIS — E782 Mixed hyperlipidemia: Secondary | ICD-10-CM

## 2020-06-21 DIAGNOSIS — I493 Ventricular premature depolarization: Secondary | ICD-10-CM | POA: Diagnosis not present

## 2020-06-21 DIAGNOSIS — I10 Essential (primary) hypertension: Secondary | ICD-10-CM

## 2020-06-21 MED ORDER — SUCRALFATE 1 GM/10ML PO SUSP: 1.0000 g | Freq: Every day | ORAL | 3 refills | Status: DC

## 2020-06-21 NOTE — Telephone Encounter (Signed)
Tried to call again, number still hangs up as soon as answered.

## 2020-06-21 NOTE — Telephone Encounter (Signed)
Called patient scheduled him to be seen with Dr. Dulce Sellar today

## 2020-06-21 NOTE — Progress Notes (Signed)
Cardiology Office Note:    Date:  06/21/2020   ID:  [name], DOB 1945/11/03, MRN [id number]  PCP:  Ronnald Nian, MD  Cardiologist:  Norman Herrlich, MD    Referring MD: Ronnald Nian, MD    ASSESSMENT:    1. Coronary artery disease of native artery of native heart with stable angina pectoris (HCC)   2. Primary hypertension   3. Mixed hyperlipidemia   4. Frequent PVCs   5. Gastroesophageal reflux disease, unspecified whether esophagitis present    PLAN:    In order of problems listed above:  1. Symptoms are very atypical for cardiac very typical for acid reflux I asked her to remain on his PPI gave him a prescription for Carafate he will take once daily because of his known CAD and residual stenosis on most recent catheterization undergo a Myoview evaluation in the office.  If he has ischemia and high risk markers would benefit from further revascularization. 2. BP is at target continue current treatment currently not on antihypertensive agents 3. Continue his lipid-lowering treatment a atorvastatin check liver function lipid profile 4. None present today.  I do not think he requires a monitor at this time 5. Continue PPI given a prescription for Carafate.  Symptoms worsen may need GI referral   Next appointment: 6 months   Medication Adjustments/Labs and Tests Ordered: Current medicines are reviewed at length with the patient today.  Concerns regarding medicines are outlined above.  Orders Placed This Encounter  Procedures  . Comprehensive metabolic panel  . Lipid panel  . MYOCARDIAL PERFUSION IMAGING  . EKG 12-Lead   Meds ordered this encounter  Medications  . sucralfate (CARAFATE) 1 GM/10ML suspension    Sig: Take 10 mLs (1 g total) by mouth daily.    Dispense:  300 mL    Refill:  3    No chief complaint on file.   History of Present Illness:    [name] is a 75 y.o. male with a hx of CAD last seen 05/26/2019.  Compliance with diet,  lifestyle and medications: Yes His history includes CAD with balloon angioplasty 1995 subsequent PCI and stent to right coronary artery and 99 ejection fraction 40 to 45% with hypertension and hyper lipidemia.Review of records catheterization 2009 patent stent right coronary artery 60% proximal LAD stenosis,  90% proximal first diagonal stenosis and 50% proximal right coronary artery stenosis. Echocardiogram performed 11/20/2018 shows EF low normal 50 to 55% there is no segmental left ventricular dysfunction and there is mild dilation of the ascending aorta 38 mm.  He was recently seen in urgent care and advised to follow-up with cardiology.    I reviewed that EKG independently from 03/18/2021 showing sinus rhythm he had frequent PVCs and inferior infarction.  He went to urgent care because he was having what he calls acid nonexertional burning in chest sternal area associated with bitter acid taste in his mouth.  He was given a GI cocktail with relief.  He went to urgent care because in the past he has gone to the emergency room when this is happened and wanted to avoid it with COVID-19.  His EKG was unchanged QRS morphology but the PVCs were new.  He has known CAD but is having no exertional chest pain but symptoms he had were not related to activity relieved with rest and were prolonged lasting for hours and completely relieved with a GI cocktail. Past Medical History:  Diagnosis Date  . ADD (  attention deficit disorder)   . Allergic rhinitis    skin test 02/23/08 vaccine 2009; allergy shots weekly through Dr. Maple Hudson  . Anemia    takes Fe -   . Anginal pain (HCC)   . Ankylosing spondylitis (HCC)   . Anxiety    sees therapist Vernell Leep  . Arthritis   . Asthma    Dr. Jetty Duhamel  . 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 CAD (coronary artery disease)    Dr. Donnie Aho  . COPD (chronic obstructive pulmonary disease) (HCC)   . Depression   . Depression with anxiety   .  Erectile dysfunction   . GERD (gastroesophageal reflux disease)   . History of benign positional vertigo   . History of kidney stones   . Hyperlipidemia   . Hypertension   . Hypogonadism male    prior use on Testim  . Insomnia    prior borderline sleep study  . Left ventricular dysfunction 10/12/2018  . Lumbar disc disease   . Mild concentric left ventricular hypertrophy (LVH)   . 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 Pneumonia   . Shortness of breath dyspnea     Past Surgical History:  Procedure Laterality Date  . BRAVO PH STUDY  02/18/2012   Procedure: BRAVO PH STUDY;  Surgeon: Florencia Reasons, MD;  Location: [facility];  Service: Endoscopy;  Laterality: N/A;  . CARDIAC CATHETERIZATION  2006   Dr. Donnie Aho  . CHEILECTOMY  10/16/2011   Procedure: CHEILECTOMY;  Surgeon: Sherri Rad, MD;  Location: [facility];  Service: Orthopedics;  Laterality: Right;  . CHOLECYSTECTOMY    . COLONOSCOPY    . CORONARY ANGIOPLASTY     1995 after MI  . CORONARY ANGIOPLASTY WITH STENT PLACEMENT     1999   . ESOPHAGOGASTRODUODENOSCOPY  02/18/2012   Procedure: ESOPHAGOGASTRODUODENOSCOPY (EGD);  Surgeon: Florencia Reasons, MD;  Location: [facility];  Service: Endoscopy;  Laterality: N/A;  . FOOT SURGERY     joint scraped, arthritis right foot  . LUMBAR DISC SURGERY     x2  . right index finger  mass removed- arthritis     Dr. Amanda Pea  . SPINAL CORD STIMULATOR INSERTION N/A 11/21/2016   Procedure: LUMBAR SPINAL CORD STIMULATOR INSERTION;  Surgeon: Venita Lick, MD;  Location: [facility];  Service: Orthopedics;  Laterality: N/A;  Requests 2.5 hrs  . TONSILLECTOMY     1953  . UPPER GI ENDOSCOPY  [id number]  . VASECTOMY     1980    Current Medications: Current Meds  Medication Sig  . ALPRAZolam (XANAX) 0.25 MG tablet TAKE 1 TABLET BY MOUTH TWICE A DAY AS NEEDED  . amoxicillin (AMOXIL) 500 MG tablet TAKE 4 TABLETS BY MOUTH 1 HOUR BEFORE DENTAL APPTS  . aspirin EC 81 MG tablet  Take 81 mg by mouth daily.  [name] atorvastatin (LIPITOR) 80 MG tablet TAKE 1 TABLET BY MOUTH  DAILY  . azelastine (ASTELIN) 0.1 % nasal spray Place 2 sprays into both nostrils 2 (two) times daily. Use in each nostril as directed  . buPROPion (WELLBUTRIN XL) 300 MG 24 hr tablet TAKE 1 TABLET BY MOUTH  DAILY  . calcium carbonate (OS-CAL) 600 MG TABS tablet Take 600 mg by mouth daily with breakfast.  . cetirizine (ZYRTEC) 10 MG tablet Take 10 mg by mouth daily.  . clopidogrel (PLAVIX) 75 MG tablet TAKE 1 TABLET BY MOUTH  DAILY  . Coenzyme Q10 (COQ10) 200 MG  CAPS Take 1 capsule by mouth daily.  [name]. desvenlafaxine (PRISTIQ) 50 MG 24 hr tablet TAKE 1 TABLET BY MOUTH  DAILY  . diltiazem (CARDIZEM CD) 240 MG 24 hr capsule TAKE 1 CAPSULE BY MOUTH  DAILY  . Flaxseed, Linseed, (FLAX SEED OIL) 1000 MG CAPS Take 1 capsule by mouth daily.  . fluocinonide (LIDEX) 0.05 % external solution APPLY 2X DAILY TO AFFECTED AREAS ON SCALP UP TO 2 WEEKS AS NEEFED FOR FLARES. AVOID FACE  . GLUCOSAMINE CHONDROITIN COMPLX PO Take 1 tablet by mouth daily.  [name]. guaiFENesin (MUCINEX) 600 MG 12 hr tablet Take 600 mg by mouth 2 (two) times daily as needed for cough.  [name]. ketoconazole (NIZORAL) 2 % shampoo 3 TIMES A WEEK LATHER ON SCALP, LEAVE ON 5 8 MINUTES, RINSE WELL  . meclizine (ANTIVERT) 25 MG tablet 1 twice a day as needed for dizziness  . methylphenidate (CONCERTA) 36 MG PO CR tablet Take 1 tablet (36 mg total) by mouth daily.  . montelukast (SINGULAIR) 10 MG tablet TAKE 1 TABLET BY MOUTH AT  BEDTIME  . Multiple Vitamins-Minerals (MULTIVITAMIN WITH MINERALS) tablet Take 1 tablet by mouth daily.  . nitroGLYCERIN (NITROSTAT) 0.4 MG SL tablet Place 1 tablet (0.4 mg total) under the tongue every 5 (five) minutes as needed.  . pantoprazole (PROTONIX) 40 MG tablet TAKE 1 TABLET BY MOUTH EVERY DAY  . PROAIR HFA 108 (90 Base) MCG/ACT inhaler TAKE 2 PUFFS BY MOUTH EVERY 4 HOURS AS NEEDED  . sucralfate (CARAFATE) 1 GM/10ML suspension Take  10 mLs (1 g total) by mouth daily.  [name]. umeclidinium-vilanterol (ANORO ELLIPTA) 62.5-25 MCG/INH AEPB Inhale 1 puff into the lungs daily.     Allergies:   Patient has no known allergies.   Social History   Socioeconomic History  . Marital status: Married    Spouse name: Not on file  . Number of children: Y  . Years of education: Not on file  . Highest education level: Not on file  Occupational History  . Occupation: Chief of Staffchoir singer  . 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 Smoking status: Never Smoker  . Smokeless tobacco: Never Used  Vaping Use  . Vaping Use: Never used  Substance and Sexual Activity  . Alcohol use: Yes    Comment: drinks a glass of wine once a month   . Drug use: No  . Sexual activity: Not Currently  Other Topics Concern  . Not on file  Social History Narrative   Married, has 2 children, 38yo son, 35yo daughter with fibromyalgia, chronic fatigue syndrome, former Metallurgistpublic health nurse.   Artist (paints)   Social Determinants of Health   Financial Resource Strain: Not on file  Food Insecurity: Not on file  Transportation Needs: Not on file  Physical Activity: Not on file  Stress: Not on file  Social Connections: Not on file     Family History: The patient's family history includes Arthritis in his sister; Chronic fatigue in his daughter; Dementia in his father and mother; Diabetes in his mother; Heart disease in his father, mother, and sister. There is no history of Cancer. ROS:   Please see the history of present illness.    All other systems reviewed and are negative.  EKGs/Labs/Other Studies Reviewed:    The following studies were reviewed today:  EKG:  EKG ordered today and personally reviewed.  The ekg ordered today demonstrates sinus rhythm old inferior infarction today's EKG has no PVCs  Recent  Labs: No results found for requested labs within last 8760 hours.  Recent Lipid Panel    Component Value  Date/Time   CHOL 122 05/26/2019 1008   TRIG 184 (H) 05/26/2019 1008   HDL 45 05/26/2019 1008   CHOLHDL 2.7 05/26/2019 1008   CHOLHDL 3.4 10/15/2016 1056   VLDL 33 (H) 10/15/2016 1056   LDLCALC 47 05/26/2019 1008    Physical Exam:    VS:  BP 112/68   Pulse 90   Ht 5' 6.5" (1.689 m)   Wt 171 lb 12.8 oz (77.9 kg)   SpO2 99%   BMI 27.31 kg/m     Wt Readings from Last 3 Encounters:  06/21/20 171 lb 12.8 oz (77.9 kg)  08/30/19 166 lb 3.2 oz (75.4 kg)  08/17/19 161 lb 6.4 oz (73.2 kg)     GEN:  Well nourished, well developed in no acute distress HEENT: Normal NECK: No JVD; No carotid bruits LYMPHATICS: No lymphadenopathy CARDIAC: RRR, no murmurs, rubs, gallops RESPIRATORY:  Clear to auscultation without rales, wheezing or rhonchi  ABDOMEN: Soft, non-tender, non-distended MUSCULOSKELETAL:  No edema; No deformity  SKIN: Warm and dry NEUROLOGIC:  Alert and oriented x 3 PSYCHIATRIC:  Normal affect    Signed, Norman Herrlich, MD  06/21/2020 2:42 PM    [facility]

## 2020-06-21 NOTE — Patient Instructions (Signed)
Medication Instructions:  Your physician has recommended you make the following change in your medication:  START: Carafate 10 mls by mouth once daily at bedtime.  *If you need a refill on your cardiac medications before your next appointment, please call your pharmacy*   Lab Work: Your physician recommends that you return for lab work in: TODAY CMP, Lipids If you have labs (blood work) drawn today and your tests are completely normal, you will receive your results only by: [name] MyChart Message (if you have MyChart) OR . A paper copy in the mail If you have any lab test that is abnormal or we need to change your treatment, we will call you to review the results.   Testing/Procedures:   [facility] [address] [place], [place] [address] Phone:  [phone]    Please arrive 15 minutes prior to your appointment time for registration and insurance purposes.  The test will take approximately 3 to 4 hours to complete; you may bring reading material.  If someone comes with you to your appointment, they will need to remain in the main lobby due to limited space in the testing area. **If you are pregnant or breastfeeding, please notify the nuclear lab prior to your appointment**  How to prepare for your Myocardial Perfusion Test: . Do not eat or drink 3 hours prior to your test, except you may have water. . Do not consume products containing caffeine (regular or decaffeinated) 12 hours prior to your test. (ex: coffee, chocolate, sodas, tea). . Do bring a list of your current medications with you.  If not listed below, you may take your medications as normal. . HOLD Erectile dysfunction medication: Cialis . Do wear comfortable clothes (no dresses or overalls) and walking shoes, tennis shoes preferred (No heels or open toe shoes are allowed). . Do NOT wear cologne, perfume, aftershave, or lotions (deodorant is allowed). . If these instructions are not followed,  your test will have to be rescheduled.  Please report to [address] for your test.  If you have questions or concerns about your appointment, you can call the [facility] at [phone].  If you cannot keep your appointment, please provide 24 hours notification to the Nuclear Lab, to avoid a possible $50 charge to your account.    Follow-Up: At [facility], you and your health needs are our priority.  As part of our continuing mission to provide you with exceptional heart care, we have created designated Provider Care Teams.  These Care Teams include your primary Cardiologist (physician) and Advanced Practice Providers (APPs -  Physician Assistants and Nurse Practitioners) who all work together to provide you with the care you need, when you need it.  We recommend signing up for the patient portal called "MyChart".  Sign up information is provided on this After Visit Summary.  MyChart is used to connect with patients for Virtual Visits (Telemedicine).  Patients are able to view lab/test results, encounter notes, upcoming appointments, etc.  Non-urgent messages can be sent to your provider as well.   To learn more about what you can do with MyChart, go to [email or web address].    Your next appointment:   6 week(s)  The format for your next appointment:   In Person  Provider:   Norman Herrlich, MD   Other Instructions

## 2020-06-21 NOTE — Telephone Encounter (Signed)
Farhad is returning [name]'s call. Please advise.

## 2020-06-21 NOTE — Telephone Encounter (Signed)
Left message on "other" number to return call.

## 2020-06-22 ENCOUNTER — Telehealth: Payer: Self-pay

## 2020-06-22 LAB — COMPREHENSIVE METABOLIC PANEL
ALT: 18 IU/L (ref 0–44)
AST: 20 IU/L (ref 0–40)
Albumin/Globulin Ratio: 1.5 (ref 1.2–2.2)
Albumin: 4.1 g/dL (ref 3.7–4.7)
Alkaline Phosphatase: 81 IU/L (ref 44–121)
BUN/Creatinine Ratio: 10 (ref 10–24)
BUN: 10 mg/dL (ref 8–27)
Bilirubin Total: 0.2 mg/dL (ref 0.0–1.2)
CO2: 24 mmol/L (ref 20–29)
Calcium: 8.8 mg/dL (ref 8.6–10.2)
Chloride: 107 mmol/L — ABNORMAL HIGH (ref 96–106)
Creatinine, Ser: 1 mg/dL (ref 0.76–1.27)
GFR calc Af Amer: 85 mL/min/{1.73_m2} (ref >59)
GFR calc non Af Amer: 74 mL/min/{1.73_m2} (ref >59)
Globulin, Total: 2.8 g/dL (ref 1.5–4.5)
Glucose: 101 mg/dL — ABNORMAL HIGH (ref 65–99)
Potassium: 3.9 mmol/L (ref 3.5–5.2)
Sodium: 143 mmol/L (ref 134–144)
Total Protein: 6.9 g/dL (ref 6.0–8.5)

## 2020-06-22 LAB — LIPID PANEL
Chol/HDL Ratio: 2.7 ratio (ref 0.0–5.0)
Cholesterol, Total: 110 mg/dL (ref 100–199)
HDL: 41 mg/dL (ref >39)
LDL Chol Calc (NIH): 46 mg/dL (ref 0–99)
Triglycerides: 133 mg/dL (ref 0–149)
VLDL Cholesterol Cal: 23 mg/dL (ref 5–40)

## 2020-06-22 NOTE — Telephone Encounter (Signed)
Spoke with patient regarding results and recommendation.  Patient verbalizes understanding and is agreeable to plan of care. Advised patient to call back with any issues or concerns.  

## 2020-06-22 NOTE — Telephone Encounter (Signed)
-----   Message from Baldo Daub, MD sent at 06/22/2020 12:17 PM EST ----- Good results no changes

## 2020-06-28 ENCOUNTER — Telehealth (HOSPITAL_COMMUNITY): Payer: Self-pay | Admitting: *Deleted

## 2020-06-28 NOTE — Telephone Encounter (Signed)
Patient given detailed instructions per Myocardial Perfusion Study Information Sheet for the test on 07/04/20 at 8:00. Patient notified to arrive 15 minutes early and that it is imperative to arrive on time for appointment to keep from having the test rescheduled.  If you need to cancel or reschedule your appointment, please call the office within 24 hours of your appointment. . Patient verbalized understanding.[name]

## 2020-06-29 ENCOUNTER — Telehealth: Payer: Self-pay

## 2020-06-29 NOTE — Telephone Encounter (Signed)
Attestation order placed. 

## 2020-06-29 NOTE — Addendum Note (Signed)
Addended by: Norman Herrlich on: 06/29/2020 08:27 AM   Modules accepted: Orders

## 2020-07-04 ENCOUNTER — Other Ambulatory Visit: Payer: Self-pay

## 2020-07-04 ENCOUNTER — Telehealth: Payer: Self-pay

## 2020-07-04 ENCOUNTER — Ambulatory Visit (INDEPENDENT_AMBULATORY_CARE_PROVIDER_SITE_OTHER): Payer: Medicare Other

## 2020-07-04 VITALS — Ht 66.0 in | Wt 171.0 lb

## 2020-07-04 DIAGNOSIS — R110 Nausea: Secondary | ICD-10-CM | POA: Diagnosis not present

## 2020-07-04 DIAGNOSIS — I10 Essential (primary) hypertension: Secondary | ICD-10-CM | POA: Diagnosis not present

## 2020-07-04 DIAGNOSIS — I2510 Atherosclerotic heart disease of native coronary artery without angina pectoris: Secondary | ICD-10-CM | POA: Diagnosis not present

## 2020-07-04 LAB — MYOCARDIAL PERFUSION IMAGING
LV dias vol: 88 mL (ref 62–150)
LV sys vol: 43 mL
Peak HR: 96 {beats}/min
Rest HR: 69 {beats}/min
SDS: 1
SRS: 6
SSS: 7
TID: 1.04

## 2020-07-04 MED ORDER — TECHNETIUM TC 99M TETROFOSMIN IV KIT
31.6000 | PACK | Freq: Once | INTRAVENOUS | Status: AC | PRN
  Administered 2020-07-04: 31.6 via INTRAVENOUS

## 2020-07-04 MED ORDER — TECHNETIUM TC 99M TETROFOSMIN IV KIT
11.0000 | PACK | Freq: Once | INTRAVENOUS | Status: AC | PRN
  Administered 2020-07-04: 11 via INTRAVENOUS

## 2020-07-04 MED ORDER — REGADENOSON 0.4 MG/5ML IV SOLN
0.4000 mg | Freq: Once | INTRAVENOUS | Status: AC
  Administered 2020-07-04: 0.4 mg via INTRAVENOUS

## 2020-07-04 MED ORDER — AMINOPHYLLINE 25 MG/ML IV SOLN
150.0000 mg | Freq: Once | INTRAVENOUS | Status: AC
  Administered 2020-07-04: 150 mg via INTRAVENOUS

## 2020-07-04 NOTE — Telephone Encounter (Signed)
Patient notified of results and verbalized understanding.  

## 2020-07-04 NOTE — Telephone Encounter (Signed)
-----   Message from Baldo Daub, MD sent at 07/04/2020 12:43 PM EST ----- Good result, no indication residual blockage needing a stent.

## 2020-07-10 ENCOUNTER — Ambulatory Visit: Payer: Medicare Other | Admitting: Cardiology

## 2020-07-24 ENCOUNTER — Other Ambulatory Visit: Payer: Self-pay | Admitting: Cardiology

## 2020-07-24 NOTE — Telephone Encounter (Signed)
Rx refill sent to pharmacy. 

## 2020-07-31 ENCOUNTER — Telehealth: Payer: Self-pay

## 2020-07-31 ENCOUNTER — Other Ambulatory Visit: Payer: Self-pay | Admitting: Family Medicine

## 2020-07-31 MED ORDER — METHYLPHENIDATE HCL ER (OSM) 36 MG PO TBCR: 36.0000 mg | EXTENDED_RELEASE_TABLET | Freq: Every day | ORAL | 0 refills | Status: DC

## 2020-07-31 NOTE — Telephone Encounter (Signed)
Pt. Called stating he needs a refill on his Concerta to [facility] [address] Pt. Last apt is 08/30/19.

## 2020-07-31 NOTE — Telephone Encounter (Signed)
Please schedule for annual wellness. Per Dr. Susann Givens. [facility]

## 2020-07-31 NOTE — Telephone Encounter (Signed)
Called pt. [name] with wife he will call back today or tomorrow to schedule AWV.

## 2020-07-31 NOTE — Telephone Encounter (Signed)
Set him up for an annual wellness visit

## 2020-08-01 ENCOUNTER — Telehealth: Payer: Self-pay | Admitting: Cardiology

## 2020-08-01 NOTE — Telephone Encounter (Signed)
Spoke with patient about fasting. Patient verbalizes understanding. No further questions or concerns at this time.

## 2020-08-01 NOTE — Telephone Encounter (Signed)
[name] is calling wanting to know if he needs to fast for his upcoming appointment. The only active request in the system are from 2020. Please advise.

## 2020-08-01 NOTE — Progress Notes (Signed)
Cardiology Office Note:    Date:  08/02/2020   ID:  Patterson, Dwayne 1946/04/06, MRN [id number]  PCP:  Ronnald Nian, MD  Cardiologist:  Norman Herrlich, MD    Referring MD: Ronnald Nian, MD    ASSESSMENT:    1. Coronary artery disease of native artery of native heart with stable angina pectoris (HCC)   2. Primary hypertension   3. Mixed hyperlipidemia    PLAN:    In order of problems listed above:  1. Stable his myocardial perfusion test showed no ischemia he has stable chronic disease having acid reflux symptoms with dropout aspirin and continue his other cardiac medications including high intensity statin and calcium channel blocker. 2. Stable continue current treatment 3. Continue high intensity statin his recent lipids 1 month ago shows an LDL cholesterol of 46.   Next appointment: 1 year   Medication Adjustments/Labs and Tests Ordered: Current medicines are reviewed at length with the patient today.  Concerns regarding medicines are outlined above.  No orders of the defined types were placed in this encounter.  No orders of the defined types were placed in this encounter.   Chief Complaint  Patient presents with  . Coronary Artery Disease  . Hypertension  . Hyperlipidemia    History of Present Illness:    [name] is a 75 y.o. male with a hx of CAD with PCI in 1995 balloon angioplasty subsequent PCI and stent to the right coronary artery in 1999 ejection fraction 40 to 45% hypertension and hyperlipidemia.  Shows mild enlargement ascending aorta 38 mm.  Last seen 06/21/2020 with nonanginal chest pain very suggestive of esophageal reflux placed on a PPI and underwent a myocardial perfusion image 07/05/2019 showing ejection fraction of 56% with normal perfusion and no ischemia.  Compliance with diet, lifestyle and medications: Yes  He had a good result adding Carafate and resolution of his acid reflux symptoms.  He feels well and his myocardial  perfusion study was normal.  Having no angina shortness of breath palpitation or syncope no longer has heartburn or indigestion.  I think you are safe to drop aspirin out and hopefully help his GI symptoms and remain on his clopidogrel as single antiplatelet therapy. Past Medical History:  Diagnosis Date  . ADD (attention deficit disorder)   . Allergic rhinitis    skin test 02/23/08 vaccine 2009; allergy shots weekly through Dr. Maple Hudson  . Anemia    takes Fe -   . Anginal pain (HCC)   . Ankylosing spondylitis (HCC)   . Anxiety    sees therapist Vernell Leep  . Arthritis   . Asthma    Dr. Jetty Duhamel  . 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 CAD (coronary artery disease)    Dr. Donnie Aho  . COPD (chronic obstructive pulmonary disease) (HCC)   . Depression   . Depression with anxiety   . Erectile dysfunction   . GERD (gastroesophageal reflux disease)   . History of benign positional vertigo   . History of kidney stones   . Hyperlipidemia   . Hypertension   . Hypogonadism male    prior use on Testim  . Insomnia    prior borderline sleep study  . Left ventricular dysfunction 10/12/2018  . Lumbar disc disease   . Mild concentric left ventricular hypertrophy (LVH)   . 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 Pneumonia   . Shortness of breath dyspnea  Past Surgical History:  Procedure Laterality Date  . BRAVO PH STUDY  02/18/2012   Procedure: BRAVO PH STUDY;  Surgeon: Florencia Reasons, MD;  Location: [facility];  Service: Endoscopy;  Laterality: N/A;  . CARDIAC CATHETERIZATION  2006   Dr. Donnie Aho  . CHEILECTOMY  10/16/2011   Procedure: CHEILECTOMY;  Surgeon: Sherri Rad, MD;  Location: [facility];  Service: Orthopedics;  Laterality: Right;  . CHOLECYSTECTOMY    . COLONOSCOPY    . CORONARY ANGIOPLASTY     1995 after MI  . CORONARY ANGIOPLASTY WITH STENT PLACEMENT     1999   . ESOPHAGOGASTRODUODENOSCOPY  02/18/2012   Procedure:  ESOPHAGOGASTRODUODENOSCOPY (EGD);  Surgeon: Florencia Reasons, MD;  Location: [facility];  Service: Endoscopy;  Laterality: N/A;  . FOOT SURGERY     joint scraped, arthritis right foot  . LUMBAR DISC SURGERY     x2  . right index finger  mass removed- arthritis     Dr. Amanda Pea  . SPINAL CORD STIMULATOR INSERTION N/A 11/21/2016   Procedure: LUMBAR SPINAL CORD STIMULATOR INSERTION;  Surgeon: Venita Lick, MD;  Location: [facility];  Service: Orthopedics;  Laterality: N/A;  Requests 2.5 hrs  . TONSILLECTOMY     1953  . UPPER GI ENDOSCOPY  [id number]  . VASECTOMY     1980    Current Medications: Current Meds  Medication Sig  . ALPRAZolam (XANAX) 0.25 MG tablet TAKE 1 TABLET BY MOUTH TWICE A DAY AS NEEDED  . amoxicillin (AMOXIL) 500 MG tablet TAKE 4 TABLETS BY MOUTH 1 HOUR BEFORE DENTAL APPTS  . aspirin EC 81 MG tablet Take 81 mg by mouth daily.  [name] atorvastatin (LIPITOR) 80 MG tablet TAKE 1 TABLET BY MOUTH  DAILY  . azelastine (ASTELIN) 0.1 % nasal spray Place 2 sprays into both nostrils 2 (two) times daily. Use in each nostril as directed  . buPROPion (WELLBUTRIN XL) 300 MG 24 hr tablet TAKE 1 TABLET BY MOUTH  DAILY  . calcium carbonate (OS-CAL) 600 MG TABS tablet Take 600 mg by mouth daily with breakfast.  . cetirizine (ZYRTEC) 10 MG tablet Take 10 mg by mouth daily.  . clopidogrel (PLAVIX) 75 MG tablet TAKE 1 TABLET BY MOUTH  DAILY  . desvenlafaxine (PRISTIQ) 50 MG 24 hr tablet TAKE 1 TABLET BY MOUTH  DAILY  . diltiazem (CARDIZEM CD) 240 MG 24 hr capsule TAKE 1 CAPSULE BY MOUTH  DAILY  . ferrous sulfate 325 (65 FE) MG tablet Take 325 mg by mouth daily with breakfast.  . Flaxseed, Linseed, (FLAX SEED OIL) 1000 MG CAPS Take 1 capsule by mouth daily.  . fluocinonide (LIDEX) 0.05 % external solution APPLY 2X DAILY TO AFFECTED AREAS ON SCALP UP TO 2 WEEKS AS NEEFED FOR FLARES. AVOID FACE  . GLUCOSAMINE CHONDROITIN COMPLX PO Take 1 tablet by mouth daily.  [name] guaiFENesin (MUCINEX) 600 MG 12 hr tablet  Take 600 mg by mouth 2 (two) times daily as needed for cough.  [name] ketoconazole (NIZORAL) 2 % shampoo 3 TIMES A WEEK LATHER ON SCALP, LEAVE ON 5 8 MINUTES, RINSE WELL  . meclizine (ANTIVERT) 25 MG tablet 1 twice a day as needed for dizziness  . methylphenidate (CONCERTA) 36 MG PO CR tablet Take 1 tablet (36 mg total) by mouth daily.  . montelukast (SINGULAIR) 10 MG tablet TAKE 1 TABLET BY MOUTH AT  BEDTIME  . Multiple Vitamins-Minerals (MULTIVITAMIN WITH MINERALS) tablet Take 1 tablet by mouth daily.  . nitroGLYCERIN (NITROSTAT)  0.4 MG SL tablet Place 1 tablet (0.4 mg total) under the tongue every 5 (five) minutes as needed.  . pantoprazole (PROTONIX) 40 MG tablet TAKE 1 TABLET BY MOUTH EVERY DAY  . PROAIR HFA 108 (90 Base) MCG/ACT inhaler TAKE 2 PUFFS BY MOUTH EVERY 4 HOURS AS NEEDED  . sucralfate (CARAFATE) 1 GM/10ML suspension Take 10 mLs (1 g total) by mouth daily.  [name] umeclidinium-vilanterol (ANORO ELLIPTA) 62.5-25 MCG/INH AEPB Inhale 1 puff into the lungs daily.     Allergies:   Patient has no known allergies.   Social History   Socioeconomic History  . Marital status: Married    Spouse name: Not on file  . Number of children: Y  . Years of education: Not on file  . Highest education level: Not on file  Occupational History  . Occupation: Chief of Staff  . Occupation: retired- Chiropractor  Tobacco Use  . Smoking status: Never Smoker  . Smokeless tobacco: Never Used  Vaping Use  . Vaping Use: Never used  Substance and Sexual Activity  . Alcohol use: Yes    Comment: drinks a glass of wine once a month   . Drug use: No  . Sexual activity: Not Currently  Other Topics Concern  . Not on file  Social History Narrative   Married, has 2 children, 38yo son, 35yo daughter with fibromyalgia, chronic fatigue syndrome, former Metallurgist.   Artist (paints)   Social Determinants of Health   Financial Resource Strain: Not on file  Food Insecurity:  Not on file  Transportation Needs: Not on file  Physical Activity: Not on file  Stress: Not on file  Social Connections: Not on file     Family History: The patient's family history includes Arthritis in his sister; Chronic fatigue in his daughter; Dementia in his father and mother; Diabetes in his mother; Heart disease in his father, mother, and sister. There is no history of Cancer. ROS:   Please see the history of present illness.    All other systems reviewed and are negative.  EKGs/Labs/Other Studies Reviewed:    The following studies were reviewed today:    Recent Labs: 06/21/2020: ALT 18; BUN 10; Creatinine, Ser 1.00; Potassium 3.9; Sodium 143  Recent Lipid Panel    Component Value Date/Time   CHOL 110 06/21/2020 1402   TRIG 133 06/21/2020 1402   HDL 41 06/21/2020 1402   CHOLHDL 2.7 06/21/2020 1402   CHOLHDL 3.4 10/15/2016 1056   VLDL 33 (H) 10/15/2016 1056   LDLCALC 46 06/21/2020 1402    Physical Exam:    VS:  BP 98/60   Pulse 78   Ht 5\' 6"  (1.676 m)   Wt 166 lb 9.6 oz (75.6 kg)   SpO2 98%   BMI 26.89 kg/m     Wt Readings from Last 3 Encounters:  08/02/20 166 lb 9.6 oz (75.6 kg)  07/04/20 171 lb (77.6 kg)  06/21/20 171 lb 12.8 oz (77.9 kg)     GEN:  Well nourished, well developed in no acute distress HEENT: Normal NECK: No JVD; No carotid bruits LYMPHATICS: No lymphadenopathy CARDIAC: RRR, no murmurs, rubs, gallops RESPIRATORY:  Clear to auscultation without rales, wheezing or rhonchi  ABDOMEN: Soft, non-tender, non-distended MUSCULOSKELETAL:  No edema; No deformity  SKIN: Warm and dry NEUROLOGIC:  Alert and oriented x 3 PSYCHIATRIC:  Normal affect    Signed, 08/19/20, MD  08/02/2020 8:49 AM    [facility]

## 2020-08-02 ENCOUNTER — Encounter: Payer: Self-pay | Admitting: Cardiology

## 2020-08-02 ENCOUNTER — Other Ambulatory Visit: Payer: Self-pay

## 2020-08-02 ENCOUNTER — Ambulatory Visit (INDEPENDENT_AMBULATORY_CARE_PROVIDER_SITE_OTHER): Payer: Medicare Other | Admitting: Cardiology

## 2020-08-02 VITALS — BP 98/60 | HR 78 | Ht 66.0 in | Wt 166.6 lb

## 2020-08-02 DIAGNOSIS — E782 Mixed hyperlipidemia: Secondary | ICD-10-CM | POA: Diagnosis not present

## 2020-08-02 DIAGNOSIS — I10 Essential (primary) hypertension: Secondary | ICD-10-CM

## 2020-08-02 DIAGNOSIS — I25118 Atherosclerotic heart disease of native coronary artery with other forms of angina pectoris: Secondary | ICD-10-CM | POA: Diagnosis not present

## 2020-08-02 NOTE — Patient Instructions (Signed)
Medication Instructions:  Your physician has recommended you make the following change in your medication:  STOP: Aspirin  *If you need a refill on your cardiac medications before your next appointment, please call your pharmacy*   Lab Work: None If you have labs (blood work) drawn today and your tests are completely normal, you will receive your results only by: . MyChart Message (if you have MyChart) OR . A paper copy in the mail If you have any lab test that is abnormal or we need to change your treatment, we will call you to review the results.   Testing/Procedures: None   Follow-Up: At [facility], you and your health needs are our priority.  As part of our continuing mission to provide you with exceptional heart care, we have created designated Provider Care Teams.  These Care Teams include your primary Cardiologist (physician) and Advanced Practice Providers (APPs -  Physician Assistants and Nurse Practitioners) who all work together to provide you with the care you need, when you need it.  We recommend signing up for the patient portal called "MyChart".  Sign up information is provided on this After Visit Summary.  MyChart is used to connect with patients for Virtual Visits (Telemedicine).  Patients are able to view lab/test results, encounter notes, upcoming appointments, etc.  Non-urgent messages can be sent to your provider as well.   To learn more about what you can do with MyChart, go to [email or web address].    Your next appointment:   1 year(s)  The format for your next appointment:   In Person  Provider:   Brian Munley, MD   Other Instructions   

## 2020-08-09 ENCOUNTER — Ambulatory Visit: Payer: Medicare Other | Admitting: Internal Medicine

## 2020-08-16 ENCOUNTER — Other Ambulatory Visit: Payer: Self-pay | Admitting: Internal Medicine

## 2020-08-16 DIAGNOSIS — J45909 Unspecified asthma, uncomplicated: Secondary | ICD-10-CM

## 2020-08-30 ENCOUNTER — Encounter: Payer: Self-pay | Admitting: Family Medicine

## 2020-08-30 ENCOUNTER — Ambulatory Visit (INDEPENDENT_AMBULATORY_CARE_PROVIDER_SITE_OTHER): Payer: Medicare Other | Admitting: Family Medicine

## 2020-08-30 ENCOUNTER — Other Ambulatory Visit: Payer: Self-pay

## 2020-08-30 VITALS — BP 110/70 | HR 76 | Temp 98.1°F | Wt 167.8 lb

## 2020-08-30 DIAGNOSIS — F341 Dysthymic disorder: Secondary | ICD-10-CM

## 2020-08-30 DIAGNOSIS — F418 Other specified anxiety disorders: Secondary | ICD-10-CM

## 2020-08-30 DIAGNOSIS — I700 Atherosclerosis of aorta: Secondary | ICD-10-CM

## 2020-08-30 DIAGNOSIS — K219 Gastro-esophageal reflux disease without esophagitis: Secondary | ICD-10-CM | POA: Diagnosis not present

## 2020-08-30 DIAGNOSIS — J4530 Mild persistent asthma, uncomplicated: Secondary | ICD-10-CM

## 2020-08-30 DIAGNOSIS — E785 Hyperlipidemia, unspecified: Secondary | ICD-10-CM

## 2020-08-30 DIAGNOSIS — E782 Mixed hyperlipidemia: Secondary | ICD-10-CM

## 2020-08-30 DIAGNOSIS — F909 Attention-deficit hyperactivity disorder, unspecified type: Secondary | ICD-10-CM

## 2020-08-30 DIAGNOSIS — N5201 Erectile dysfunction due to arterial insufficiency: Secondary | ICD-10-CM | POA: Diagnosis not present

## 2020-08-30 DIAGNOSIS — I25118 Atherosclerotic heart disease of native coronary artery with other forms of angina pectoris: Secondary | ICD-10-CM

## 2020-08-30 DIAGNOSIS — N529 Male erectile dysfunction, unspecified: Secondary | ICD-10-CM

## 2020-08-30 DIAGNOSIS — J301 Allergic rhinitis due to pollen: Secondary | ICD-10-CM

## 2020-08-30 DIAGNOSIS — J45909 Unspecified asthma, uncomplicated: Secondary | ICD-10-CM

## 2020-08-30 DIAGNOSIS — J4520 Mild intermittent asthma, uncomplicated: Secondary | ICD-10-CM

## 2020-08-30 HISTORY — DX: Atherosclerosis of aorta: I70.0

## 2020-08-30 MED ORDER — TADALAFIL 20 MG PO TABS
20.0000 mg | ORAL_TABLET | Freq: Every day | ORAL | 5 refills | Status: DC | PRN
Start: 2020-08-30 — End: 2021-10-11

## 2020-08-30 MED ORDER — DESVENLAFAXINE SUCCINATE ER 50 MG PO TB24: 50.0000 mg | ORAL_TABLET | Freq: Every day | ORAL | 3 refills | Status: DC

## 2020-08-30 MED ORDER — AZELASTINE HCL 0.1 % NA SOLN: 2.0000 | Freq: Two times a day (BID) | NASAL | 3 refills | Status: DC

## 2020-08-30 MED ORDER — MONTELUKAST SODIUM 10 MG PO TABS
10.0000 mg | ORAL_TABLET | Freq: Every day | ORAL | 3 refills | Status: DC
Start: 2020-08-30 — End: 2022-05-15

## 2020-08-30 MED ORDER — ANORO ELLIPTA 62.5-25 MCG/INH IN AEPB
INHALATION_SPRAY | RESPIRATORY_TRACT | 3 refills | Status: DC
Start: 2020-08-30 — End: 2020-09-13

## 2020-08-30 MED ORDER — ATORVASTATIN CALCIUM 80 MG PO TABS: 80.0000 mg | ORAL_TABLET | Freq: Every day | ORAL | 3 refills | Status: DC

## 2020-08-30 MED ORDER — BUPROPION HCL ER (XL) 300 MG PO TB24: 300.0000 mg | ORAL_TABLET | Freq: Every day | ORAL | 3 refills | Status: DC

## 2020-08-30 MED ORDER — PANTOPRAZOLE SODIUM 40 MG PO TBEC: 40.0000 mg | DELAYED_RELEASE_TABLET | Freq: Every day | ORAL | 3 refills | Status: DC

## 2020-08-30 MED ORDER — METHYLPHENIDATE HCL ER (OSM) 36 MG PO TBCR: 36.0000 mg | EXTENDED_RELEASE_TABLET | Freq: Every day | ORAL | 0 refills | Status: DC

## 2020-08-30 NOTE — Progress Notes (Signed)
   Subjective:    Patient ID: [name], male    DOB: [birth year], 76 y.o.   MRN: [id number]  HPI He is here for medication management check.  He does have underlying ADD and would like a refill on his Concerta.  He uses this daily and gets good relief with it for better focus.  He would like a refill on his Cialis.  He does have some slight erectile dysfunction and he does find this useful.  He knows not to take this with nitroglycerin.  He rarely uses that medication.  He does have reflux disease and is using sucralfate as well as pantoprazole which seems to keep this under control.  He continues on Trintellix as well as Wellbutrin which tends to help keep him psychologically in a good spot.  He does follow-up with pulmonary and does have underlying asthma as well as allergies well controlled.  He rarely uses albuterol.  He does take Singulair with no difficulty.  Also is on Anoro.  Does have x-ray evidence of atherosclerosis.   Review of Systems     Objective:   Physical Exam Alert and in no distress. Tympanic membranes and canals are normal. Pharyngeal area is normal. Neck is supple without adenopathy or thyromegaly. Cardiac exam shows a regular sinus rhythm without murmurs or gallops. Lungs are clear to auscultation.        Assessment & Plan:  Attention deficit hyperactivity disorder (ADHD), unspecified ADHD type - Plan: methylphenidate (CONCERTA) 36 MG PO CR tablet  Erectile dysfunction due to arterial insufficiency - Plan: tadalafil (CIALIS) 20 MG tablet  Gastroesophageal reflux disease, unspecified whether esophagitis present  Coronary artery disease of native artery of native heart with stable angina pectoris (HCC)  Mixed hyperlipidemia  Dysthymia  Mild intermittent asthma without complication  Erectile dysfunction, unspecified erectile dysfunction type  Atherosclerosis of aorta (HCC)  Gastroesophageal reflux disease without esophagitis - Plan: pantoprazole  (PROTONIX) 40 MG tablet  Mild persistent chronic asthma without complication - Plan: montelukast (SINGULAIR) 10 MG tablet  Non-seasonal allergic rhinitis due to pollen - Plan: montelukast (SINGULAIR) 10 MG tablet  Depression with anxiety - Plan: desvenlafaxine (PRISTIQ) 50 MG 24 hr tablet, buPROPion (WELLBUTRIN XL) 300 MG 24 hr tablet  Chronic asthma without complication, unspecified asthma severity, unspecified whether persistent - Plan: azelastine (ASTELIN) 0.1 % nasal spray, umeclidinium-vilanterol (ANORO ELLIPTA) 62.5-25 MCG/INH AEPB  Hyperlipidemia, unspecified hyperlipidemia type - Plan: atorvastatin (LIPITOR) 80 MG tablet

## 2020-09-13 ENCOUNTER — Other Ambulatory Visit: Payer: Self-pay | Admitting: Internal Medicine

## 2020-09-13 DIAGNOSIS — J45909 Unspecified asthma, uncomplicated: Secondary | ICD-10-CM

## 2020-12-03 NOTE — Progress Notes (Signed)
[name] is a 75 y.o. male who presents for annual wellness visit,CPE and follow-up on chronic medical conditions.  He has no major concerns or complaints.  He does need to refill on his ADD medications.  He does have intermittent difficulty with BPPV and does do well on meclizine.  He does have reflux symptoms and presently is taking his Protonix as well as using some sucralfate given to him by cardiology.  Continues have difficulty with some back discomfort but does keep himself fairly active.  He has had a couple of falls due to tripping over his shoes.  He does have a previous history of hypogonadism but has not been on testosterone in quite some time.  He continues on Wellbutrin as well as Pristiq and seems to be doing well on that.  He sees cardiology regularly and continues on Plavix, Cardizem and does have nitroglycerin if he needs it.  His breathing seems to be doing well on his present medication regimen.  He does use ProAir sparingly.  His allergies seem to be under good control on his present medication regimen.  He does wear hearing aids.  He has x-ray evidence of atherosclerosis of the aorta.   Immunizations and Health Maintenance Immunization History  Administered Date(s) Administered   Fluad Quad(high Dose 65+) 04/14/2020   Influenza Split 04/23/2011   Influenza Whole 03/20/2009, 03/19/2010   Influenza, High Dose Seasonal PF 04/12/2016, 02/26/2017, 04/02/2018, 01/30/2019, 01/30/2019   Influenza,inj,Quad PF,6+ Mos 03/05/2013, 02/23/2014, 04/21/2015   Influenza-Unspecified 04/02/2018, 01/30/2019   PFIZER Comirnaty(Gray Top)Covid-19 Tri-Sucrose Vaccine 06/22/2020   PFIZER(Purple Top)SARS-COV-2 Vaccination 07/02/2019, 07/23/2019   Pneumococcal Conjugate-13 03/04/2014   Pneumococcal Polysaccharide-23 06/10/2005, 05/19/2012   Td 10/20/2006   Tdap 10/20/2006, 02/26/2017   Zoster Recombinat (Shingrix) 12/10/2016, 07/15/2017   Zoster, Live 01/01/2007   Health Maintenance Due   Topic Date Due   COVID-19 Vaccine (4 - Booster for Pfizer series) 10/20/2020    Last colonoscopy:03/31/2006 Last PSA: 07/23/2013 Dentist: Q six months  Ophtho: Q year Exercise: N/A  Other doctors caring for patient include: Dr. Dulce Sellar cardiology Dr Maple Hudson  Advanced Directives: Does Patient Have a Medical Advance Directive?: Yes Type of Advance Directive: Living will Copy asked for Depression screen:  See questionnaire below.     Depression screen [facility] 2/9 12/04/2020 08/17/2019 04/23/2018 02/02/2018 10/15/2016  Decreased Interest 0 0 0 0 0  Down, Depressed, Hopeless 0 0 0 0 0  PHQ - 2 Score 0 0 0 0 0  Altered sleeping 0 - - - -  Tired, decreased energy 1 - - - -  Change in appetite 0 - - - -  Feeling bad or failure about yourself  0 - - - -  Trouble concentrating 0 - - - -  Moving slowly or fidgety/restless 0 - - - -  Suicidal thoughts 0 - - - -  PHQ-9 Score 1 - - - -  Difficult doing work/chores Not difficult at all - - - -  Some recent data might be hidden    Fall Screen: See Questionaire below.   Fall Risk  12/04/2020 08/17/2019 04/23/2018 02/02/2018 10/15/2016  Falls in the past year? 1 1 0 No No  Number falls in past yr: 1 1 - - -  Injury with Fall? 0 0 - - -  Risk for fall due to : Impaired balance/gait;Impaired mobility - - - -  Follow up Falls evaluation completed - - - -    ADL screen:  See questionnaire below.  Functional Status Survey: Is the patient deaf or have difficulty hearing?: No Does the patient have difficulty seeing, even when wearing glasses/contacts?: No Does the patient have difficulty concentrating, remembering, or making decisions?: No Does the patient have difficulty walking or climbing stairs?: Yes Does the patient have difficulty dressing or bathing?: No Does the patient have difficulty doing errands alone such as visiting a doctor's office or shopping?: No   Review of Systems  Constitutional: -, -unexpected weight change, -anorexia,  -fatigue Allergy: -sneezing, -itching, -congestion Dermatology: denies changing moles, rash, lumps ENT: -runny nose, -ear pain, -sore throat,  Cardiology:  -chest pain, -palpitations, -orthopnea, Respiratory: -cough, -shortness of breath, -dyspnea on exertion, -wheezing,  Gastroenterology: -abdominal pain, -nausea, -vomiting, -diarrhea, -constipation, -dysphagia Hematology: -bleeding or bruising problems Musculoskeletal: -arthralgias, -myalgias, -joint swelling, -back pain, - Ophthalmology: -vision changes,  Urology: -dysuria, -difficulty urinating,  -urinary frequency, -urgency, incontinence Neurology: -, -numbness, , -memory loss, -falls, -dizziness    PHYSICAL EXAM:  BP 120/80   Pulse 80   Temp (!) 96.7 F (35.9 C)   Ht 5' 6.25" (1.683 m)   Wt 158 lb 12.8 oz (72 kg)   SpO2 98%   BMI 25.44 kg/m   General Appearance: Alert, cooperative, no distress, appears stated age Head: Normocephalic, without obvious abnormality, atraumatic Eyes: PERRL, conjunctiva/corneas clear, EOM's intact, Ears: Normal TM's and external ear canals Nose: Nares normal, mucosa normal, no drainage or sinus   tenderness Throat: Lips, mucosa, and tongue normal; teeth and gums normal Neck: Supple, no lymphadenopathy, thyroid:no enlargement/tenderness/nodules; no carotid bruit or JVD Lungs: Clear to auscultation bilaterally without wheezes, rales or ronchi; respirations unlabored Heart: Regular rate and rhythm, S1 and S2 normal, no murmur, rub or gallop Abdomen: Soft, non-tender, nondistended, normoactive bowel sounds, no masses, no hepatosplenomegaly Extremities: No clubbing, cyanosis or edema Pulses: 2+ and symmetric all extremities Skin: Skin color, texture, turgor normal, no rashes or lesions Lymph nodes: Cervical, supraclavicular, and axillary nodes normal Neurologic: CNII-XII intact, normal strength, sensation and gait; reflexes 2+ and symmetric throughout   Psych: Normal mood, affect, hygiene and  grooming  ASSESSMENT/PLAN: Routine general medical examination at a health care facility  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  Benign paroxysmal positional vertigo, unspecified laterality  Gastroesophageal reflux disease, unspecified whether esophagitis present  Coronary artery disease of native artery of native heart with stable angina pectoris (HCC)  Depression with anxiety - Plan: buPROPion (WELLBUTRIN XL) 150 MG 24 hr tablet  Mixed hyperlipidemia  Lumbar disc disease  Primary hypertension  Allergic rhinitis due to pollen, unspecified seasonality  Arthritis  Presbycusis of both ears  Mild intermittent asthma without complication  Atherosclerosis of aorta (HCC)  Immunization, viral disease - Plan: CANCELED: PFIZER Comirnaty(GRAY TOP)COVID-19 Vaccine  Other chronic pain  Hypogonadism male - Plan: Testosterone  Left ventricular dysfunction  Mild persistent chronic asthma without complication He is to cut back on his Wellbutrin to see if we can actually get him off that medication.  He is comfortable trying that.  I will come down 150 mg and follow-up in 1 month.  We will check his testosterone and see if he actually needs some testosterone at this point.  He will continue to be followed by cardiology.  Did recommend he get physically active and do physical therapy to help with his back.   recommended at least 30 minutes of aerobic activity at least 5 days/week;  Immunization recommendations discussed.  Colonoscopy recommendations reviewed.   Medicare Attestation I have personally reviewed: The patient's  medical and social history Their use of alcohol, tobacco or illicit drugs Their current medications and supplements The patient's functional ability including ADLs,fall risks, home safety risks, cognitive, and hearing and visual impairment Diet and physical activities Evidence for depression or mood disorders  The patient's weight, height, and  BMI have been recorded in the chart.  I have made referrals, counseling, and provided education to the patient based on review of the above and I have provided the patient with a written personalized care plan for preventive services.     Sharlot Gowda, MD   12/04/2020

## 2020-12-04 ENCOUNTER — Encounter: Payer: Self-pay | Admitting: Family Medicine

## 2020-12-04 ENCOUNTER — Other Ambulatory Visit: Payer: Self-pay

## 2020-12-04 ENCOUNTER — Ambulatory Visit (INDEPENDENT_AMBULATORY_CARE_PROVIDER_SITE_OTHER): Payer: Medicare Other | Admitting: Family Medicine

## 2020-12-04 VITALS — BP 120/80 | HR 80 | Temp 96.7°F | Ht 66.25 in | Wt 158.8 lb

## 2020-12-04 DIAGNOSIS — M1990 Unspecified osteoarthritis, unspecified site: Secondary | ICD-10-CM

## 2020-12-04 DIAGNOSIS — I519 Heart disease, unspecified: Secondary | ICD-10-CM

## 2020-12-04 DIAGNOSIS — I25118 Atherosclerotic heart disease of native coronary artery with other forms of angina pectoris: Secondary | ICD-10-CM

## 2020-12-04 DIAGNOSIS — I700 Atherosclerosis of aorta: Secondary | ICD-10-CM

## 2020-12-04 DIAGNOSIS — F418 Other specified anxiety disorders: Secondary | ICD-10-CM

## 2020-12-04 DIAGNOSIS — F909 Attention-deficit hyperactivity disorder, unspecified type: Secondary | ICD-10-CM | POA: Diagnosis not present

## 2020-12-04 DIAGNOSIS — K219 Gastro-esophageal reflux disease without esophagitis: Secondary | ICD-10-CM

## 2020-12-04 DIAGNOSIS — M519 Unspecified thoracic, thoracolumbar and lumbosacral intervertebral disc disorder: Secondary | ICD-10-CM

## 2020-12-04 DIAGNOSIS — J4530 Mild persistent asthma, uncomplicated: Secondary | ICD-10-CM

## 2020-12-04 DIAGNOSIS — H8110 Benign paroxysmal vertigo, unspecified ear: Secondary | ICD-10-CM

## 2020-12-04 DIAGNOSIS — Z0000 Encounter for general adult medical examination without abnormal findings: Secondary | ICD-10-CM | POA: Diagnosis not present

## 2020-12-04 DIAGNOSIS — J4520 Mild intermittent asthma, uncomplicated: Secondary | ICD-10-CM

## 2020-12-04 DIAGNOSIS — E782 Mixed hyperlipidemia: Secondary | ICD-10-CM

## 2020-12-04 DIAGNOSIS — J301 Allergic rhinitis due to pollen: Secondary | ICD-10-CM

## 2020-12-04 DIAGNOSIS — I10 Essential (primary) hypertension: Secondary | ICD-10-CM

## 2020-12-04 DIAGNOSIS — H9113 Presbycusis, bilateral: Secondary | ICD-10-CM

## 2020-12-04 DIAGNOSIS — G8929 Other chronic pain: Secondary | ICD-10-CM

## 2020-12-04 DIAGNOSIS — E291 Testicular hypofunction: Secondary | ICD-10-CM

## 2020-12-04 DIAGNOSIS — Z23 Encounter for immunization: Secondary | ICD-10-CM

## 2020-12-04 MED ORDER — BUPROPION HCL ER (XL) 150 MG PO TB24: 150.0000 mg | ORAL_TABLET | Freq: Every day | ORAL | 0 refills | Status: DC

## 2020-12-04 NOTE — Patient Instructions (Signed)
  Mr. Bunn , Thank you for taking time to come for your Medicare Wellness Visit. I appreciate your ongoing commitment to your health goals. Please review the following plan we discussed and let me know if I can assist you in the future.   These are the goals we discussed: Call me in one month and let me know ow you are doing   This is a list of the screening recommended for you and due dates:  Health Maintenance  Topic Date Due   COVID-19 Vaccine (4 - Booster for Pfizer series) 10/20/2020   Flu Shot  01/08/2021   Colon Cancer Screening  10/21/2023   Tetanus Vaccine  02/27/2027   Hepatitis C Screening: USPSTF Recommendation to screen - Ages 18-79 yo.  Completed   Pneumonia vaccines  Completed   Zoster (Shingles) Vaccine  Completed   HPV Vaccine  Aged Out

## 2020-12-05 LAB — TESTOSTERONE: Testosterone: 431 ng/dL (ref 264–916)

## 2020-12-15 ENCOUNTER — Other Ambulatory Visit: Payer: Self-pay | Admitting: Medical

## 2020-12-15 ENCOUNTER — Telehealth: Payer: Self-pay

## 2020-12-15 DIAGNOSIS — F909 Attention-deficit hyperactivity disorder, unspecified type: Secondary | ICD-10-CM

## 2020-12-15 MED ORDER — METHYLPHENIDATE HCL ER (OSM) 36 MG PO TBCR: 36.0000 mg | EXTENDED_RELEASE_TABLET | Freq: Every day | ORAL | 0 refills | Status: DC

## 2020-12-15 MED ORDER — MECLIZINE HCL 25 MG PO TABS: ORAL_TABLET | ORAL | 0 refills | Status: DC

## 2020-12-15 NOTE — Telephone Encounter (Signed)
Pt needs refill Meclizine & Concerta BOTH to local pharmacy

## 2020-12-21 ENCOUNTER — Ambulatory Visit: Payer: Medicare Other

## 2020-12-21 ENCOUNTER — Telehealth: Payer: Self-pay | Admitting: Cardiology

## 2020-12-21 NOTE — Telephone Encounter (Signed)
Advised that Dr. Dulce Sellar is out of the office today and it will be tomorrow before he responds. Advised if reflux he should reach out to his GI or PCP for recommendations. Pt then states you willcare when I have a heart attack. Advised to go to the ED if concerns are chest pain and pt states that this has been going on for "months" and he can no longer stand the pain. Pt verbalized understanding and had no additional questions.

## 2020-12-21 NOTE — Telephone Encounter (Signed)
Pt c/o medication issue:  1. Name of Medication:  pantoprazole (PROTONIX) 40 MG tablet sucralfate (CARAFATE) 1 GM/10ML suspension  2. How are you currently taking this medication (dosage and times per day)? Was taking for acid   3. Are you having a reaction (difficulty breathing--STAT)? no  4. What is your medication issue? Patient states he was prescribed both medications for his acid. He states it worked for a while and then got worse. He states he went to a walk in clinic and they gave him the sucralfate tablets and baclofen, but that also stopped helping. He states he will do whatever Dr. Dulce Sellar says to get some relief.

## 2020-12-22 NOTE — Telephone Encounter (Signed)
Patient calling back to follow up.

## 2020-12-22 NOTE — Telephone Encounter (Signed)
Was able to return pt call regarding his f/u of his acid reflux. Reports was recent started on sucralfate and baclofen. Advised the Baclofen helps with muscle spasms, can help with the spasms in his epiglottis, not so much with reducing he acidity in his stomach,  but the sucralfate with help with any stomach ulcers causing that the ulcers may be causing.  Advised the use of his Protonix 40 mg, as he has not been taking recently, found his old bottle and stated to discard 10/2020, advised to do so as medication loses their potency. Pt verbalized understanding, did advised based on the EMR, he has 3 refills remaining that he can call to have refill. Pt stated will do so.  Suggest OTC Pepcid for breakthrough heartburn throughout the day, 10-20 mg later in the day with Protonix and sucralfate in the am. Also reeducated pt on diet changes, reduce fried food, acid foods, tomato based food, citrus fruits, chocolate, and carbonated beverages. Pt verbalized understanding.  Suggest GI specialist, reports he has an appt with Dr. Hilda Lias next week. Strongly advised to keep that appt for further evaluation and to keep his PCP Dr. Susann Givens updated regarding his GERD.   Call back for any cardiac related symptoms such as CP (not r/t acid reflex), sudden SOB, HR or BP issues. Pt verbalized understanding, very grateful for the extensive phone conversation and education. Otherwise all questions or concerns were address and no additional concerns at this time. Agreeable to plan, will call back for anything further.

## 2020-12-27 ENCOUNTER — Telehealth: Payer: Self-pay

## 2020-12-27 NOTE — Telephone Encounter (Signed)
   [facility] Pre-operative Risk Assessment    Patient Name: [name]  DOB: 19-Dec-1945 MRN: [id number]  HEARTCARE STAFF:  - IMPORTANT!!!!!! Under Visit Info/Reason for Call, type in Other and utilize the format Clearance MM/DD/YY or Clearance TBD. Do not use dashes or single digits. - Please review there is not already an duplicate clearance open for this procedure. - If request is for dental extraction, please clarify the # of teeth to be extracted. - If the patient is currently at the dentist's office, call Pre-Op Callback Staff (MA/nurse) to input urgent request.  - If the patient is not currently in the dentist office, please route to the Pre-Op pool.  Request for surgical clearance:  What type of surgery is being performed? Colonoscopy, endoscopy  When is this surgery scheduled? 01/26/2021  What type of clearance is required (medical clearance vs. Pharmacy clearance to hold med vs. Both)? Both  Are there any medications that need to be held prior to surgery and how long? Plavix- amount of days unspecified.   Practice name and name of physician performing surgery? [facility]. Dr. Cristina Gong  What is the office phone number? [phone]   7.   What is the office fax number? [phone]  8.   Anesthesia type (None, local, MAC, general) ? General   Gita Kudo 12/27/2020, 11:31 AM  _________________________________________________________________   (provider comments below)

## 2020-12-29 ENCOUNTER — Encounter: Payer: Self-pay | Admitting: Family Medicine

## 2020-12-29 ENCOUNTER — Other Ambulatory Visit: Payer: Self-pay

## 2020-12-29 ENCOUNTER — Ambulatory Visit (INDEPENDENT_AMBULATORY_CARE_PROVIDER_SITE_OTHER): Payer: Medicare Other | Admitting: Family Medicine

## 2020-12-29 VITALS — BP 120/70 | HR 78 | Temp 98.0°F | Wt 158.0 lb

## 2020-12-29 DIAGNOSIS — R490 Dysphonia: Secondary | ICD-10-CM

## 2020-12-29 DIAGNOSIS — M519 Unspecified thoracic, thoracolumbar and lumbosacral intervertebral disc disorder: Secondary | ICD-10-CM

## 2020-12-29 DIAGNOSIS — K219 Gastro-esophageal reflux disease without esophagitis: Secondary | ICD-10-CM

## 2020-12-29 DIAGNOSIS — R2689 Other abnormalities of gait and mobility: Secondary | ICD-10-CM

## 2020-12-29 NOTE — Telephone Encounter (Signed)
    Patient Name: [name]  DOB: [birth year] MRN: [id number]   Primary Cardiologist: Norman Herrlich, MD   Chart reviewed as part of pre-operative protocol coverage for colonoscopy and endoscopy 01/26/2021.     Patient has history of CAD, hypertension, and hyperlipidemia.  He was last seen in office 08/02/2020 by his primary cardiologist.  He reported reflux symptoms with recommendation to follow-up with GI. He has done well since that time with continued reflux reported.  Therefore, based on ACC/AHA guidelines, the patient would be at acceptable risk for the planned procedure without further cardiovascular testing.   Per Dr. Dulce Sellar, acceptable to hold Plavix.  Recommend discontinuing clopidogrel 7 days prior to surgery and initiation of aspirin 81 mg daily at that time.  Reinitiation of clopidogrel (and discontinuation of aspirin) should be done as soon as it is felt safe to do so after surgery.   I will route this recommendation to the requesting party via Epic fax function and remove from pre-op pool. Please call with questions.  Lennon Alstrom, PA-C 12/29/2020, 1:11 PM

## 2020-12-29 NOTE — Telephone Encounter (Signed)
   Patient Name: [name]  DOB: 1946/01/21 MRN: [id number]  Primary Cardiologist: Norman Herrlich, MD  Chart reviewed as part of pre-operative protocol coverage for colonoscopy and endoscopy 01/26/2021.    Patient has history of CAD, hypertension, and hyperlipidemia.  He was last seen in office 08/02/2020 by his primary cardiologist.  He reported reflux symptoms with recommendation to follow-up with GI.  Will route to primary cardiologist for recommendations regarding Plavix and to confirm that he is OK to not be on ASA at this time (reviewed office note - I believe that is what is documented but would like clarification).    Lennon Alstrom, PA-C 12/29/2020, 12:08 PM

## 2020-12-29 NOTE — Progress Notes (Signed)
   Subjective:    Patient ID: [name], male    DOB: June 25, 1945, 75 y.o.   MRN: [id number]  HPI He is here for consult concerning multiple issues.  He notes that he walks with a slightly forward stance causing him to always feel like he is shuffling his feet and falling.  He apparently has fallen on several occasions.  He does have difficulty with chronic back pain and does have a stimulator in.  He does complain of intermittent pain but cannot definitely say that is what is causing his forward posture.  He has not had that adjusted.  Towards the end of the interview he then mentioned the fact that he had seen a chiropractor for his back pain as well.  He also sees cardiology and was complaining of reflux symptoms.  He was placed on Protonix as well as sucralfate.  He has been using Protonix in the morning and sucralfate at night and has been doing this for at least a month.  He still has reflux type symptoms.  He has made an appointment to see Dr. Matthias Hughs to get an endoscopy done.  His main concern is the hoarse voice that he is having.  His wife is with him and endorses all of the above.   Review of Systems     Objective:   Physical Exam Alert and in no distress.  When asked to stand up straight he was able to do that with minimal discomfort.  Cardiac exam shows regular rhythm without murmurs gallops.  Lungs clear to auscultation.  DTRs normal.       Assessment & Plan:  Shuffling gait  Hoarse voice quality  Gastroesophageal reflux disease, unspecified whether esophagitis present  Lumbar disc disease I explained that the shuffling gait could potentially be related to his back pain and he needs to get the stimulator adjusted.  If that does not work then we can consider physical therapy and also possible referral to neurology as some of his symptoms could be Parkinson's related.  He will follow-up with Bu[facility]ini concerning the reflux symptoms.  Discussed always keeping me informed  concerning all these medications.  Recommend that he follow-up with Dr. Matthias Hughs and I concerning reflux rather than his cardiologist.  If his hoarse voice does not improve, we can refer to ENT but would prefer to see what Dr. Matthias Hughs has to say.  I explained that it is always best to run his medical care through the as he is getting fragmented care seeing various specialists which can sometimes interfere with the quality of his care.  Greater than 40 minutes spent in counseling and coordination of care.

## 2021-01-08 ENCOUNTER — Other Ambulatory Visit: Payer: Self-pay | Admitting: Family Medicine

## 2021-01-08 DIAGNOSIS — F418 Other specified anxiety disorders: Secondary | ICD-10-CM

## 2021-01-08 NOTE — Telephone Encounter (Signed)
[facility] is requesting a 90 for wellbutrin. Please advise [facility]

## 2021-01-26 LAB — HM COLONOSCOPY

## 2021-02-12 ENCOUNTER — Other Ambulatory Visit: Payer: Self-pay | Admitting: Cardiology

## 2021-02-12 DIAGNOSIS — I2510 Atherosclerotic heart disease of native coronary artery without angina pectoris: Secondary | ICD-10-CM

## 2021-02-13 NOTE — Telephone Encounter (Signed)
Clopidogrel 75 mg  # 90 x 3 refills sent to  [facility] ([facility]) - [place], [place] - [phone] [address]

## 2021-02-26 DIAGNOSIS — K5792 Diverticulitis of intestine, part unspecified, without perforation or abscess without bleeding: Secondary | ICD-10-CM

## 2021-03-09 ENCOUNTER — Other Ambulatory Visit: Payer: Medicare Other

## 2021-03-12 ENCOUNTER — Other Ambulatory Visit: Payer: Medicare Other

## 2021-03-13 ENCOUNTER — Other Ambulatory Visit: Payer: Self-pay

## 2021-03-13 ENCOUNTER — Ambulatory Visit (INDEPENDENT_AMBULATORY_CARE_PROVIDER_SITE_OTHER): Payer: Medicare Other

## 2021-03-13 DIAGNOSIS — Z23 Encounter for immunization: Secondary | ICD-10-CM | POA: Diagnosis not present

## 2021-04-13 ENCOUNTER — Other Ambulatory Visit: Payer: Self-pay

## 2021-04-13 DIAGNOSIS — F909 Attention-deficit hyperactivity disorder, unspecified type: Secondary | ICD-10-CM

## 2021-04-14 MED ORDER — METHYLPHENIDATE HCL ER (OSM) 36 MG PO TBCR: 36.0000 mg | EXTENDED_RELEASE_TABLET | Freq: Every day | ORAL | 0 refills | Status: DC

## 2021-04-23 ENCOUNTER — Ambulatory Visit (INDEPENDENT_AMBULATORY_CARE_PROVIDER_SITE_OTHER): Payer: Medicare Other | Admitting: Family Medicine

## 2021-04-23 ENCOUNTER — Encounter: Payer: Self-pay | Admitting: Family Medicine

## 2021-04-23 ENCOUNTER — Other Ambulatory Visit: Payer: Self-pay

## 2021-04-23 VITALS — BP 110/68 | HR 72 | Temp 97.3°F | Wt 161.6 lb

## 2021-04-23 DIAGNOSIS — S76212A Strain of adductor muscle, fascia and tendon of left thigh, initial encounter: Secondary | ICD-10-CM

## 2021-04-23 NOTE — Progress Notes (Signed)
   Subjective:    Patient ID: [name], male    DOB: Dec 14, 1945, 75 y.o.   MRN: [id number]  HPI He is here for for evaluation of left inguinal pain.  He states that on Saturday he did a lot of heavy lifting but noted no difficulty at that time.  He did note the next day have some left inguinal discomfort similar to when he had a hernia. He also complains of some bilateral hip discomfort and occasional pain down his left leg.  He has been exercising but mainly doing water aerobic type stuff but not on a strenuous level like he has in the past.   Review of Systems     Objective:   Physical Exam Alert and in no distress.  Exam of the left inguinal area shows no hernia.  Testes normal.  No real tenderness in the inguinal ligament area.       Assessment & Plan:  Inguinal strain, left, initial encounter I reassured him that I saw no evidence of hernia.  Recommend supportive care.  Also recommend increase his physical activity and do more stretching.  If he continues have difficulty, he should return for further evaluation.

## 2021-05-17 HISTORY — PX: SKIN BIOPSY: SHX1

## 2021-05-18 HISTORY — PX: OTHER SURGICAL HISTORY: SHX169

## 2021-07-31 ENCOUNTER — Other Ambulatory Visit: Payer: Self-pay | Admitting: Family Medicine

## 2021-08-01 NOTE — Telephone Encounter (Signed)
Is this okay to refill? 

## 2021-08-10 DIAGNOSIS — Q72899 Other reduction defects of unspecified lower limb: Secondary | ICD-10-CM | POA: Insufficient documentation

## 2021-08-10 DIAGNOSIS — M79671 Pain in right foot: Secondary | ICD-10-CM | POA: Insufficient documentation

## 2021-08-10 HISTORY — DX: Pain in right foot: M79.671

## 2021-08-10 HISTORY — DX: Other reduction defects of unspecified lower limb: Q72.899

## 2021-08-16 ENCOUNTER — Other Ambulatory Visit: Payer: Self-pay | Admitting: Cardiology

## 2021-08-29 ENCOUNTER — Other Ambulatory Visit: Payer: Self-pay | Admitting: Family Medicine

## 2021-08-29 ENCOUNTER — Telehealth: Payer: Self-pay

## 2021-08-29 DIAGNOSIS — F418 Other specified anxiety disorders: Secondary | ICD-10-CM

## 2021-08-29 NOTE — Telephone Encounter (Signed)
[facility] is requesting to fill pt pristiq. Please advise [facility] ?

## 2021-08-29 NOTE — Telephone Encounter (Signed)
? ?  Pre-operative Risk Assessment  ?  ?Patient Name: [name]  ?DOB: Mar 21, 1946 ?MRN: [id number]  ? ?  ? ?Request for Surgical Clearance   ? ?Procedure:   REPLACEMENT OF SPINAL CORD STIMULATOR BATTERY ? ?Date of Surgery:  Clearance TBD                              ?   ?Surgeon:  DR. DAHARI BROOKS ?Surgeon's Group or Practice Name:  [facility] ?Phone number:  [phone] ?Fax number:  [phone] ?  ?Type of Clearance Requested:   ?- Medical  ?  ?Type of Anesthesia:  General  ?  ?Additional requests/questions:  Please advise surgeon/provider what medications should be held.  ?Please instruct on Plavix and if patient can stop this 5-7 days prior to surgery ? ? ?Signed, ?Viviano Simas   ?08/29/2021, 2:36 PM   ?

## 2021-08-30 NOTE — Telephone Encounter (Signed)
Primary Cardiologist:Brian Bettina Gavia, MD ? ?Chart reviewed as part of pre-operative protocol coverage. Because of [name]'s past medical history and time since last visit, he/she will require a follow-up visit in order to better assess preoperative cardiovascular risk. ? ?Pre-op covering staff: ?- Please schedule appointment and call patient to inform them. ?- Please contact requesting surgeon's office via preferred method (i.e, phone, fax) to inform them of need for appointment prior to surgery. ? ?If applicable, this message will also be routed to pharmacy pool and/or primary cardiologist for input on holding anticoagulant/antiplatelet agent as requested below so that this information is available at time of patient's appointment.  ? ?[name], NP  ?08/30/2021, 11:20 AM  ? ?

## 2021-08-30 NOTE — Telephone Encounter (Signed)
Pt has appt with Dr. Bettina Gavia 09/11/21. Will add pre op clearance needed to appt notes. Will send FYI to requesting office the pt has appt 09/11/21 ?

## 2021-09-03 ENCOUNTER — Other Ambulatory Visit: Payer: Self-pay

## 2021-09-03 ENCOUNTER — Ambulatory Visit (INDEPENDENT_AMBULATORY_CARE_PROVIDER_SITE_OTHER): Payer: Medicare Other | Admitting: Family Medicine

## 2021-09-03 ENCOUNTER — Encounter: Payer: Self-pay | Admitting: Family Medicine

## 2021-09-03 VITALS — BP 108/68 | HR 64 | Ht 67.0 in | Wt 160.0 lb

## 2021-09-03 DIAGNOSIS — N529 Male erectile dysfunction, unspecified: Secondary | ICD-10-CM

## 2021-09-03 DIAGNOSIS — F418 Other specified anxiety disorders: Secondary | ICD-10-CM

## 2021-09-03 DIAGNOSIS — I25118 Atherosclerotic heart disease of native coronary artery with other forms of angina pectoris: Secondary | ICD-10-CM

## 2021-09-03 DIAGNOSIS — M519 Unspecified thoracic, thoracolumbar and lumbosacral intervertebral disc disorder: Secondary | ICD-10-CM

## 2021-09-03 DIAGNOSIS — Z01818 Encounter for other preprocedural examination: Secondary | ICD-10-CM

## 2021-09-03 DIAGNOSIS — J4530 Mild persistent asthma, uncomplicated: Secondary | ICD-10-CM | POA: Diagnosis not present

## 2021-09-03 NOTE — Progress Notes (Signed)
? ?  Subjective:  ? ? Patient ID: [name], male    DOB: [birth year], 76 y.o.   MRN: [id number] ? ?HPI ?He is here for preoperative evaluation.  He has chronic back pain and does have a stimulator in his back that batteries need to be changed.  Does have an underlying history of coronary artery disease.  He has had no chest pain, shortness of breath, PND or DOE.  He is scheduled to see cardiology for preoperative evaluation.  He also has underlying asthma and has this under adequate control.  He has seen Dr. Maple Hudson in the past for this.  He does have questions concerning his bupropion.  He apparently has bupropion 150 and 300.  Review of the record indicates he should just be on the 150.  He also states that the tell Cialis is not as effective and he has tried other ED medications. ? ? ?Review of Systems ? ?   ?Objective:  ? Physical Exam ?Alert and in no distress.  Cardiac exam shows regular rhythm without murmurs or gallops.  Lungs are clear to auscultation.  Unable to do EKG due to machine malfunction. ? ? ? ?   ?Assessment & Plan:  ?Preoperative evaluation to rule out surgical contraindication ? ?Lumbar disc disease ? ?Coronary artery disease of native artery of native heart with stable angina pectoris (HCC) ? ?Mild persistent chronic asthma without complication ? ?Depression with anxiety ? ?Erectile dysfunction, unspecified erectile dysfunction type ?From my perspective he is cleared for surgery pending further evaluation from cardiology. ?Continue on his present medication regimen with the stipulation of just using 150 mg of the bupropion 150 mg. ?I explained that at this time since he has tried other ED meds without success, the only option would be to see urology for possible injections.  He is not really ready to do that. ? ?

## 2021-09-11 ENCOUNTER — Ambulatory Visit (INDEPENDENT_AMBULATORY_CARE_PROVIDER_SITE_OTHER): Payer: Medicare Other | Admitting: Cardiology

## 2021-09-11 ENCOUNTER — Encounter: Payer: Self-pay | Admitting: Cardiology

## 2021-09-11 VITALS — BP 102/56 | HR 80 | Ht 67.0 in | Wt 158.4 lb

## 2021-09-11 DIAGNOSIS — I25118 Atherosclerotic heart disease of native coronary artery with other forms of angina pectoris: Secondary | ICD-10-CM

## 2021-09-11 DIAGNOSIS — Z860100 Personal history of colon polyps, unspecified: Secondary | ICD-10-CM

## 2021-09-11 DIAGNOSIS — R634 Abnormal weight loss: Secondary | ICD-10-CM | POA: Insufficient documentation

## 2021-09-11 DIAGNOSIS — R6881 Early satiety: Secondary | ICD-10-CM | POA: Insufficient documentation

## 2021-09-11 DIAGNOSIS — E782 Mixed hyperlipidemia: Secondary | ICD-10-CM | POA: Diagnosis not present

## 2021-09-11 DIAGNOSIS — R600 Localized edema: Secondary | ICD-10-CM

## 2021-09-11 DIAGNOSIS — E611 Iron deficiency: Secondary | ICD-10-CM

## 2021-09-11 DIAGNOSIS — R490 Dysphonia: Secondary | ICD-10-CM | POA: Insufficient documentation

## 2021-09-11 DIAGNOSIS — I10 Essential (primary) hypertension: Secondary | ICD-10-CM | POA: Diagnosis not present

## 2021-09-11 DIAGNOSIS — Z01810 Encounter for preprocedural cardiovascular examination: Secondary | ICD-10-CM

## 2021-09-11 DIAGNOSIS — K5901 Slow transit constipation: Secondary | ICD-10-CM | POA: Insufficient documentation

## 2021-09-11 DIAGNOSIS — K2970 Gastritis, unspecified, without bleeding: Secondary | ICD-10-CM | POA: Insufficient documentation

## 2021-09-11 DIAGNOSIS — K5900 Constipation, unspecified: Secondary | ICD-10-CM | POA: Insufficient documentation

## 2021-09-11 DIAGNOSIS — K224 Dyskinesia of esophagus: Secondary | ICD-10-CM | POA: Insufficient documentation

## 2021-09-11 DIAGNOSIS — Z85038 Personal history of other malignant neoplasm of large intestine: Secondary | ICD-10-CM | POA: Insufficient documentation

## 2021-09-11 DIAGNOSIS — R059 Cough, unspecified: Secondary | ICD-10-CM | POA: Insufficient documentation

## 2021-09-11 HISTORY — DX: Abnormal weight loss: R63.4

## 2021-09-11 HISTORY — DX: Dyskinesia of esophagus: K22.4

## 2021-09-11 HISTORY — DX: Slow transit constipation: K59.01

## 2021-09-11 HISTORY — DX: Dysphonia: R49.0

## 2021-09-11 HISTORY — DX: Early satiety: R68.81

## 2021-09-11 HISTORY — DX: Personal history of colon polyps, unspecified: Z86.0100

## 2021-09-11 HISTORY — DX: Iron deficiency: E61.1

## 2021-09-11 NOTE — Progress Notes (Signed)
?Cardiology Office Note:   ? ?Date:  09/11/2021  ? ?ID:  JAMESPAUL Patterson, DOB Feb 26, 1946, MRN [id number] ? ?PCP:  Denita Lung, MD  ?Cardiologist:  Shirlee More, MD   ? ?Referring MD: Denita Lung, MD  ? ? ?ASSESSMENT:   ? ?1. Coronary artery disease of native artery of native heart with stable angina pectoris ([place])   ?2. Primary hypertension   ?3. Mixed hyperlipidemia   ?4. Bilateral lower extremity edema   ?5. Preoperative cardiovascular examination   ? ?PLAN:   ? ?In order of problems listed above: ? ?Sellers continues doing well with CAD he has had no angina current treatment after PCI stable CAD continue his medical therapy including clopidogrel his rate limiting calcium channel blocker and statin.  He has mild edema and I offered to switch him to a beta-blocker but he does not want to change medication ?BP at target continue his current medication ?Continues high intensity statin has been over a year we will check a CMP and lipid profile ?Continue to monitor mild side effect to his calcium channel blocker ?He will hold his clopidogrel 7 days prior to changing his spinal cord stimulator usually restarted 48 hours after ? ? ?Next appointment: 1 year ? ? ?Medication Adjustments/Labs and Tests Ordered: ?Current medicines are reviewed at length with the patient today.  Concerns regarding medicines are outlined above.  ?No orders of the defined types were placed in this encounter. ? ?No orders of the defined types were placed in this encounter. ? ? ?Chief Complaint  ?Patient presents with  ? Follow-up  ? Leg Swelling  ? Hyperlipidemia  ? ? ?History of Present Illness:   ? ?[name] is a 76 y.o. male with a hx of CAD with PCI balloon angioplasty 1995 subsequent PCI and stent right coronary artery in 1999 mild LV dysfunction EF 40 to 45% hypertension hyperlipidemia and top normal ascending aorta at 38 mm.  He was seen January 2022 with nonanginal chest pain suggestive of esophageal reflux placed on  a PPI with resolution of symptoms underwent a myocardial perfusion imaging 07/05/2019 showing ejection fraction 56% with normal perfusion and function.  He was last seen 08/02/2020.[name] ?Compliance with diet, lifestyle and medications: Yes ? ?He has several concerns ?First he is scheduled for change on his spinal cord stimulator generator and asked for a note to his surgeon about holding Plavix he will stop it for 7 days prior to procedure usually started 2 days afterwards.  He has stable CAD having no angina and requires no further preoperative evaluation or testing. ?He is concerned about a life insurance apparently taking nitroglycerin as a problem he asked for a note and I told him I can give him a note that he has not needed nitroglycerin in the last 3 years.  I told him unfortunately it is a marker that he has CAD and is probably the reason he is having a problem obtaining life insurance ?From a medical perspective he is doing well no angina dyspnea palpitation or syncope.  He has not had a lipid profile in over a year we will do 1 in the office today ?He tolerates clopidogrel without bleeding and his statin without muscle pain or weakness ?He also has a mild degree of dependent edema related to his calcium channel blocker. ?Past Medical History:  ?Diagnosis Date  ? ADD (attention deficit disorder)   ? Allergic rhinitis   ? skin test 02/23/08 vaccine 2009; allergy shots weekly through  Dr. Annamaria Boots  ? Anemia   ? takes Fe -   ? Anginal pain ([place])   ? Ankylosing spondylitis ([place])   ? Anxiety   ? sees therapist Jackelyn Hoehn  ? Arthritis   ? Asthma   ? Dr. Baird Lyons  ? 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 CAD (coronary artery disease)   ? Dr. Wynonia Lawman  ? COPD (chronic obstructive pulmonary disease) ([place])   ? Depression   ? Depression with anxiety   ? Erectile dysfunction   ? GERD (gastroesophageal reflux disease)   ? History of benign positional vertigo   ? History of kidney stones   ?  Hyperlipidemia   ? Hypertension   ? Hypogonadism male   ? prior use on Testim  ? Insomnia   ? prior borderline sleep study  ? Left ventricular dysfunction 10/12/2018  ? Lumbar disc disease   ? Mild concentric left ventricular hypertrophy (LVH)   ? 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 Pneumonia   ? Shortness of breath dyspnea   ? ? ?Past Surgical History:  ?Procedure Laterality Date  ? BRAVO [place] STUDY  02/18/2012  ? Procedure: BRAVO [place];  Surgeon: Cleotis Nipper, MD;  Location: [facility];  Service: Endoscopy;  Laterality: N/A;  ? CARDIAC CATHETERIZATION  06/10/2004  ? Dr. Wynonia Lawman  ? CHEILECTOMY  10/16/2011  ? Procedure: CHEILECTOMY;  Surgeon: Colin Rhein, MD;  Location: [place];  Service: Orthopedics;  Laterality: Right;  ? CHOLECYSTECTOMY    ? COLONOSCOPY    ? CORONARY ANGIOPLASTY    ? 1995 after MI  ? CORONARY ANGIOPLASTY WITH STENT PLACEMENT    ? 1999   ? ESOPHAGOGASTRODUODENOSCOPY  02/18/2012  ? Procedure: ESOPHAGOGASTRODUODENOSCOPY (EGD);  Surgeon: Cleotis Nipper, MD;  Location: [facility];  Service: Endoscopy;  Laterality: N/A;  ? FOOT SURGERY    ? joint scraped, arthritis right foot  ? LUMBAR DISC SURGERY    ? x2  ? right index finger  mass removed- arthritis    ? Dr. Amedeo Plenty  ? [facility]  Right 05/18/2021  ? medial toe consistent with callus  ? SKIN BIOPSY Left 05/17/2021  ? actinic keratosis  ? SPINAL CORD STIMULATOR INSERTION N/A 11/21/2016  ? Procedure: LUMBAR SPINAL CORD STIMULATOR INSERTION;  Surgeon: Melina Schools, MD;  Location: [place];  Service: Orthopedics;  Laterality: N/A;  Requests 2.5 hrs  ? TONSILLECTOMY    ? 1953  ? [facility] ENDOSCOPY  [id number]  ? VASECTOMY    ? 1980  ? ? ?Current Medications: ?Current Meds  ?Medication Sig  ? ALPRAZolam (XANAX) 0.25 MG tablet TAKE 1 TABLET BY MOUTH TWICE A DAY AS NEEDED  ? atorvastatin (LIPITOR) 80 MG tablet Take 1 tablet (80 mg total) by mouth daily.  ? azelastine (ASTELIN) 0.1 % nasal spray Place 2 sprays into both nostrils 2  (two) times daily. Use in each nostril as directed  ? buPROPion (WELLBUTRIN XL) 300 MG 24 hr tablet Take 150 mg by mouth daily.  ? calcium carbonate (OS-CAL) 600 MG TABS tablet Take 600 mg by mouth daily with breakfast.  ? Calcium-Magnesium-Vitamin D (CALCIUM MAGNESIUM PO) Take by mouth.  ? clopidogrel (PLAVIX) 75 MG tablet TAKE 1 TABLET BY MOUTH  DAILY  ? Coenzyme Q10 (COQ10) 200 MG CAPS Take 1 capsule by mouth daily.  ? desvenlafaxine (PRISTIQ) 50 MG 24 hr tablet TAKE 1 TABLET BY MOUTH  DAILY  ? diltiazem (CARDIZEM CD) 240  MG 24 hr capsule TAKE 1 CAPSULE BY MOUTH  DAILY  ? ferrous sulfate 325 (65 FE) MG tablet Take 325 mg by mouth daily with breakfast.  ? Flaxseed, Linseed, (FLAX SEED OIL) 1000 MG CAPS Take 1 capsule by mouth daily.  ? GLUCOSAMINE CHONDROITIN COMPLX PO Take 1 tablet by mouth daily.  ? guaiFENesin (MUCINEX) 600 MG 12 hr tablet Take 600 mg by mouth 2 (two) times daily as needed for cough.  ? levocetirizine (XYZAL) 5 MG tablet Take 5 mg by mouth daily.  ? meclizine (ANTIVERT) 25 MG tablet 1 twice a day as needed for dizziness  ? methylphenidate (CONCERTA) 36 MG PO CR tablet Take 1 tablet (36 mg total) by mouth daily.  ? montelukast (SINGULAIR) 10 MG tablet Take 1 tablet (10 mg total) by mouth at bedtime.  ? Multiple Vitamins-Minerals (MULTIVITAMIN WITH MINERALS) tablet Take 1 tablet by mouth daily.  ? nitroGLYCERIN (NITROSTAT) 0.4 MG SL tablet Place 1 tablet (0.4 mg total) under the tongue every 5 (five) minutes as needed.  ? pantoprazole (PROTONIX) 40 MG tablet Take 1 tablet (40 mg total) by mouth daily.  ? PROAIR HFA 108 (90 Base) MCG/ACT inhaler TAKE 2 PUFFS BY MOUTH EVERY 4 HOURS AS NEEDED  ? sucralfate (CARAFATE) 1 GM/10ML suspension Take 10 mLs (1 g total) by mouth daily.  ? tadalafil (CIALIS) 20 MG tablet Take 1 tablet (20 mg total) by mouth daily as needed for erectile dysfunction.  ? umeclidinium-vilanterol (ANORO ELLIPTA) 62.5-25 MCG/INH AEPB INHALE 1 PUFF BY MOUTH EVERY DAY  ?   ? ?Allergies:   Patient has no known allergies.  ? ?Social History  ? ?Socioeconomic History  ? Marital status: Married  ?  Spouse name: Not on file  ? Number of children: Y  ? Years of education: Not on file  ? Highest educa

## 2021-09-11 NOTE — Patient Instructions (Signed)
Medication Instructions:  ?Your physician has recommended you make the following change in your medication:  ?Stop Clopidogrel 7 days prior to surgery and restart 2 days after ? ?*If you need a refill on your cardiac medications before your next appointment, please call your pharmacy* ? ? ?Lab Work: ?Your physician recommends that you return for lab work in: Today for a CMP and Lipid Panel ? ?If you have labs (blood work) drawn today and your tests are completely normal, you will receive your results only by: ?MyChart Message (if you have MyChart) OR ?A paper copy in the mail ?If you have any lab test that is abnormal or we need to change your treatment, we will call you to review the results. ? ? ?Testing/Procedures: ?NONE ? ? ?Follow-Up: ?At [facility], you and your health needs are our priority.  As part of our continuing mission to provide you with exceptional heart care, we have created designated Provider Care Teams.  These Care Teams include your primary Cardiologist (physician) and Advanced Practice Providers (APPs -  Physician Assistants and Nurse Practitioners) who all work together to provide you with the care you need, when you need it. ? ?We recommend signing up for the patient portal called "MyChart".  Sign up information is provided on this After Visit Summary.  MyChart is used to connect with patients for Virtual Visits (Telemedicine).  Patients are able to view lab/test results, encounter notes, upcoming appointments, etc.  Non-urgent messages can be sent to your provider as well.   ?To learn more about what you can do with MyChart, go to [email or web address].   ? ?Your next appointment:   ?1 year(s) ? ?The format for your next appointment:   ?In Person ? ?Provider:   ?Norman Herrlich, MD  ? ? ?Other Instructions ?  ?

## 2021-09-12 ENCOUNTER — Telehealth: Payer: Self-pay

## 2021-09-12 LAB — COMPREHENSIVE METABOLIC PANEL
ALT: 19 IU/L (ref 0–44)
AST: 17 IU/L (ref 0–40)
Albumin/Globulin Ratio: 1.8 (ref 1.2–2.2)
Albumin: 4.2 g/dL (ref 3.7–4.7)
Alkaline Phosphatase: 76 IU/L (ref 44–121)
BUN/Creatinine Ratio: 21 (ref 10–24)
BUN: 17 mg/dL (ref 8–27)
Bilirubin Total: 0.2 mg/dL (ref 0.0–1.2)
CO2: 22 mmol/L (ref 20–29)
Calcium: 9 mg/dL (ref 8.6–10.2)
Chloride: 107 mmol/L — ABNORMAL HIGH (ref 96–106)
Creatinine, Ser: 0.8 mg/dL (ref 0.76–1.27)
Globulin, Total: 2.3 g/dL (ref 1.5–4.5)
Glucose: 98 mg/dL (ref 70–99)
Potassium: 4.3 mmol/L (ref 3.5–5.2)
Sodium: 141 mmol/L (ref 134–144)
Total Protein: 6.5 g/dL (ref 6.0–8.5)
eGFR: 92 mL/min/{1.73_m2} (ref >59)

## 2021-09-12 LAB — LIPID PANEL
Chol/HDL Ratio: 2.4 ratio (ref 0.0–5.0)
Cholesterol, Total: 91 mg/dL — ABNORMAL LOW (ref 100–199)
HDL: 38 mg/dL — ABNORMAL LOW (ref >39)
LDL Chol Calc (NIH): 37 mg/dL (ref 0–99)
Triglycerides: 78 mg/dL (ref 0–149)
VLDL Cholesterol Cal: 16 mg/dL (ref 5–40)

## 2021-09-12 NOTE — Telephone Encounter (Signed)
Patient notified of results.

## 2021-09-12 NOTE — Telephone Encounter (Signed)
-----   Message from Baldo Daub, MD sent at 09/12/2021  9:50 AM EDT ----- ?Regarding: FW: ?Good result no changes in treatment ?----- Message ----- ?From: Interface, [facility] Lab Results In ?Sent: 09/12/2021   5:38 AM EDT ?To: Baldo Daub, MD ? ?

## 2021-09-21 ENCOUNTER — Ambulatory Visit: Payer: Self-pay | Admitting: Orthopedic Surgery

## 2021-09-30 ENCOUNTER — Other Ambulatory Visit: Payer: Self-pay | Admitting: Family Medicine

## 2021-09-30 DIAGNOSIS — E785 Hyperlipidemia, unspecified: Secondary | ICD-10-CM

## 2021-10-02 NOTE — Pre-Procedure Instructions (Signed)
Surgical Instructions ? ? ? Your procedure is scheduled on Thursday, May 4th. ? Report to [facility] Main Entrance "A" at 8:25 A.M., then check in with the Admitting office. ? Call this number if you have problems the morning of surgery: ? [phone] ? ? If you have any questions prior to your surgery date call [phone]: Open Monday-Friday 8am-4pm ? ? ? Remember: ? Do not eat after midnight the night before your surgery ? ?You may drink clear liquids until 7:25 a.m. the morning of your surgery.   ?Clear liquids allowed are: Water, Non-Citrus Juices (without pulp), Carbonated Beverages, Clear Tea, Black Coffee ONLY (NO MILK, CREAM OR POWDERED CREAMER of any kind), and Gatorade. ?  ? Take these medicines the morning of surgery with A SIP OF WATER:  ?atorvastatin (LIPITOR)  ?buPROPion (WELLBUTRIN XL)  ?cetirizine (ZYRTEC) ?desvenlafaxine (PRISTIQ)  ?diltiazem (CARDIZEM CD)  ?pantoprazole (PROTONIX)  ?umeclidinium-vilanterol (ANORO ELLIPTA) ? ?As needed: ?ALPRAZolam Prudy Feeler)  ?azelastine (ASTELIN) nasal spray ?meclizine (ANTIVERT)  ?Proair Inhaler-please bring with you to the hospital.  ? ?Per your cardiologist, you are to HOLD clopidogrel (PLAVIX) for 7 days pre-operatively.  ? ?As of today, STOP taking any Aspirin (unless otherwise instructed by your surgeon) Aleve, Naproxen, Ibuprofen, Motrin, Advil, Goody's, BC's, all herbal medications, fish oil, and all vitamins. ? ?         ?Do not wear jewelry. ?Do not wear lotions, powders, colognes, or deodorant. ?Men may shave face and neck. ?Do not bring valuables to the hospital. ? ?[facility] is not responsible for any belongings or valuables. .  ? ?Do NOT Smoke (Tobacco/Vaping)  24 hours prior to your procedure ? ?If you use a CPAP at night, you may bring your mask for your overnight stay. ?  ?Contacts, glasses, hearing aids, dentures or partials may not be worn into surgery, please bring cases for these belongings ?  ?For patients admitted to the hospital,  discharge time will be determined by your treatment team. ?  ?Patients discharged the day of surgery will not be allowed to drive home, and someone needs to stay with them for 24 hours. ? ? ?SURGICAL WAITING ROOM VISITATION ?Patients having surgery or a procedure in a hospital may have two support people. ?Children under the age of 30 must have an adult with them who is not the patient. ?They may stay in the waiting area during the procedure and may switch out with other visitors. If the patient needs to stay at the hospital during part of their recovery, the visitor guidelines for inpatient rooms apply. ? ?Please refer to the [facility] website for the visitor guidelines for Inpatients (after your surgery is over and you are in a regular room).  ? ? ? ? ? ?Special instructions:   ? ?Oral Hygiene is also important to reduce your risk of infection.  Remember - BRUSH YOUR TEETH THE MORNING OF SURGERY WITH YOUR REGULAR TOOTHPASTE ? ? ?[facility]- Preparing For Surgery ? ?Before surgery, you can play an important role. Because skin is not sterile, your skin needs to be as free of germs as possible. You can reduce the number of germs on your skin by washing with CHG (chlorahexidine gluconate) Soap before surgery.  CHG is an antiseptic cleaner which kills germs and bonds with the skin to continue killing germs even after washing.   ? ? ?Please do not use if you have an allergy to CHG or antibacterial soaps. If your skin becomes reddened/irritated stop using the CHG.  ?  Do not shave (including legs and underarms) for at least 48 hours prior to first CHG shower. It is OK to shave your face. ? ?Please follow these instructions carefully. ?  ? ? Shower the NIGHT BEFORE SURGERY and the MORNING OF SURGERY with CHG Soap.  ? If you chose to wash your hair, wash your hair first as usual with your normal shampoo. After you shampoo, rinse your hair and body thoroughly to remove the shampoo.  Then [employer] and genitals (private  parts) with your normal soap and rinse thoroughly to remove soap. ? ?After that Use CHG Soap as you would any other liquid soap. You can apply CHG directly to the skin and wash gently with a scrungie or a clean washcloth.  ? ?Apply the CHG Soap to your body ONLY FROM THE NECK DOWN.  Do not use on open wounds or open sores. Avoid contact with your eyes, ears, mouth and genitals (private parts). Wash Face and genitals (private parts)  with your normal soap.  ? ?Wash thoroughly, paying special attention to the area where your surgery will be performed. ? ?Thoroughly rinse your body with warm water from the neck down. ? ?DO NOT shower/wash with your normal soap after using and rinsing off the CHG Soap. ? ?Pat yourself dry with a CLEAN TOWEL. ? ?Wear CLEAN PAJAMAS to bed the night before surgery ? ?Place CLEAN SHEETS on your bed the night before your surgery ? ?DO NOT SLEEP WITH PETS. ? ? ?Day of Surgery: ? ?Take a shower with CHG soap. ?Wear Clean/Comfortable clothing the morning of surgery ?Do not apply any deodorants/lotions.   ?Remember to brush your teeth WITH YOUR REGULAR TOOTHPASTE. ? ? ? ?If you received a COVID test during your pre-op visit, it is requested that you wear a mask when out in public, stay away from anyone that may not be feeling well, and notify your surgeon if you develop symptoms. If you have been in contact with anyone that has tested positive in the last 10 days, please notify your surgeon. ? ?  ?Please read over the following fact sheets that you were given.  ? ?

## 2021-10-03 ENCOUNTER — Encounter (HOSPITAL_COMMUNITY)
Admission: RE | Admit: 2021-10-03 | Discharge: 2021-10-03 | Disposition: A | Discharge status: Home / Self Care | Payer: Medicare Other | Source: Ambulatory Visit | Attending: Orthopedic Surgery | Admitting: Orthopedic Surgery

## 2021-10-03 ENCOUNTER — Encounter (HOSPITAL_COMMUNITY): Payer: Self-pay

## 2021-10-03 ENCOUNTER — Other Ambulatory Visit: Payer: Self-pay

## 2021-10-03 VITALS — BP 111/75 | HR 71 | Temp 97.7°F | Resp 18 | Ht 67.0 in | Wt 158.5 lb

## 2021-10-03 DIAGNOSIS — Z955 Presence of coronary angioplasty implant and graft: Secondary | ICD-10-CM | POA: Insufficient documentation

## 2021-10-03 DIAGNOSIS — Z01812 Encounter for preprocedural laboratory examination: Secondary | ICD-10-CM | POA: Diagnosis present

## 2021-10-03 DIAGNOSIS — I2510 Atherosclerotic heart disease of native coronary artery without angina pectoris: Secondary | ICD-10-CM | POA: Insufficient documentation

## 2021-10-03 DIAGNOSIS — J45909 Unspecified asthma, uncomplicated: Secondary | ICD-10-CM | POA: Diagnosis not present

## 2021-10-03 DIAGNOSIS — Z01818 Encounter for other preprocedural examination: Secondary | ICD-10-CM

## 2021-10-03 LAB — CBC
HCT: 39.7 % (ref 39.0–52.0)
Hemoglobin: 12.7 g/dL — ABNORMAL LOW (ref 13.0–17.0)
MCH: 26.2 pg (ref 26.0–34.0)
MCHC: 32 g/dL (ref 30.0–36.0)
MCV: 82 fL (ref 80.0–100.0)
Platelets: 270 10*3/uL (ref 150–400)
RBC: 4.84 MIL/uL (ref 4.22–5.81)
RDW: 17.3 % — ABNORMAL HIGH (ref 11.5–15.5)
WBC: 7.1 10*3/uL (ref 4.0–10.5)
nRBC: 0 % (ref 0.0–0.2)

## 2021-10-03 LAB — SURGICAL PCR SCREEN
MRSA, PCR: NEGATIVE
Staphylococcus aureus: POSITIVE — AB

## 2021-10-03 NOTE — Progress Notes (Signed)
PCP - Sharlot Gowda MD ?Cardiologist - Norman Herrlich MD ? ?PPM/ICD - Denies ?Device Orders -  ?Rep Notified -  ? ?Chest x-ray - na ?EKG - 09/11/21 ?Stress Test - 07/04/20 ?ECHO - 11/11/18 ?Cardiac Cath - 1999 ? ?Sleep Study - none ?CPAP -  ? ?Fasting Blood Sugar - n/a ?Checks Blood Sugar _____ times a day ? ?Blood Thinner Instructions:Pt states his LD of Plavix will be 10/05/21 ?Aspirin Instructions:na ? ?ERAS Protcol -clear liquids until 0725 ?PRE-SURGERY Ensure or G2- no ? ?COVID TEST- na ? ? ?Anesthesia review: yes-cardiac history;provider order ? ?Patient denies shortness of breath, fever, cough and chest pain at PAT appointment ? ? ?All instructions explained to the patient, with a verbal understanding of the material. Patient agrees to go over the instructions while at home for a better understanding. Patient also instructed to wear a mask when out in public prior to surgery. . The opportunity to ask questions was provided. ?  ?

## 2021-10-04 NOTE — Progress Notes (Signed)
Anesthesia Chart Review: ? ?Follows with cardiology for history of CAD s/p PCI balloon angioplasty 1995 with subsequent PCI and stent of the RCA in 1999. He was seen January 2022 with nonanginal chest pain suggestive of esophageal reflux placed on a PPI with resolution of symptoms underwent a myocardial perfusion imaging 07/05/2019 showing ejection fraction 56% with normal perfusion and function.  Seen by Dr. Dulce Sellar on 09/11/2021 for preop evaluation.  Doing well at that time from CAD standpoint, no angina.  He was cleared to hold Plavix 7 days prior to spinal cord stimulator revision. ? ?Patient reports last dose Plavix 10/05/2021. ? ?History of mild persistent asthma, followed by PCP. ? ?Preop labs reviewed, unremarkable. ? ?EKG 09/11/2021: NSR.  Rate 80.  Inferior infarct, age undetermined. ? ?Nuclear stress 07/04/20: ?Nuclear stress EF: 51%. ?There was no ST segment deviation noted during stress. ?Defect 1: There is a small defect of mild severity present in the basal inferior and mid inferior location. ?This is a low risk study. ?The left ventricular ejection fraction is mildly decreased (45-54%). ?No evidence of ischemia or MI. ?Diaphragmatic attenuaton noted. ?Low normal EF. ? ?TTE 11/11/2018: ?Nuclear stress EF: 51%. ?There was no ST segment deviation noted during stress. ?Defect 1: There is a small defect of mild severity present in the basal inferior and mid inferior location. ?This is a low risk study. ?The left ventricular ejection fraction is mildly decreased (45-54%). ?No evidence of ischemia or MI. ?Diaphragmatic attenuaton noted. ?Low normal EF. ? ? ?Antionette Poles, PA-C ?[facility] Short Stay [facility]/Anesthesiology ?Phone [phone] ?10/04/2021 4:22 PM ? ?

## 2021-10-04 NOTE — Anesthesia Preprocedure Evaluation (Addendum)
Anesthesia Evaluation  ?Patient identified by MRN, date of birth, ID band ?Patient awake ? ? ? ?Reviewed: ?Allergy & Precautions, NPO status , Patient's Chart, lab work & pertinent test results ? ?Airway ?Mallampati: II ? ?TM Distance: >3 FB ?Neck ROM: Full ? ? ? Dental ?no notable dental hx. ?(+) Dental Advisory Given ?  ?Pulmonary ?shortness of breath, asthma , COPD,  ?  ?Pulmonary exam normal ?breath sounds clear to auscultation ? ? ? ? ? ? Cardiovascular ?hypertension, Pt. on medications ?+ CAD, + Past MI and + Cardiac Stents  ?Normal cardiovascular exam ?Rhythm:Regular Rate:Normal ? ? ?  ?Neuro/Psych ?Anxiety Depression negative neurological ROS ? negative psych ROS  ? GI/Hepatic ?Neg liver ROS, GERD  ,  ?Endo/Other  ?negative endocrine ROS ? Renal/GU ?negative Renal ROS  ?negative genitourinary ?  ?Musculoskeletal ? ?(+) Arthritis , Osteoarthritis,   ? Abdominal ?  ?Peds ?negative pediatric ROS ?(+)  Hematology ?negative hematology ROS ?(+)   ?Anesthesia Other Findings ? ? Reproductive/Obstetrics ?negative OB ROS ? ?  ? ? ? ? ? ? ? ? ? ? ? ? ? ?  ?  ? ? ? ? ? ? ?Anesthesia Physical ?Anesthesia Plan ? ?ASA: 3 ? ?Anesthesia Plan: General  ? ?Post-op Pain Management: Dilaudid IV and Minimal or no pain anticipated  ? ?Induction: Intravenous ? ?PONV Risk Score and Plan: 2 and Ondansetron, Midazolam and Treatment may vary due to age or medical condition ? ?Airway Management Planned: Oral ETT ? ?Additional Equipment:  ? ?Intra-op Plan:  ? ?Post-operative Plan: Extubation in OR ? ?Informed Consent: I have reviewed the patients History and Physical, chart, labs and discussed the procedure including the risks, benefits and alternatives for the proposed anesthesia with the patient or authorized representative who has indicated his/her understanding and acceptance.  ? ? ? ?Dental advisory given ? ?Plan Discussed with: CRNA ? ?Anesthesia Plan Comments: (PAT note by Antionette Poles,  PA-C: ?Follows with cardiology for history of CAD s/p PCI balloon angioplasty 1995 with subsequent PCI and stent of the RCA in 1999. He was seen January 2022 with nonanginal chest pain suggestive of esophageal reflux placed on a PPI with resolution of symptoms underwent a myocardial perfusion imaging 07/05/2019 showing ejection fraction 56% with normal perfusion and function.  Seen by Dr. Dulce Sellar on 09/11/2021 for preop evaluation.  Doing well at that time from CAD standpoint, no angina.  He was cleared to hold Plavix 7 days prior to spinal cord stimulator revision. ? ?Patient reports last dose Plavix 10/05/2021. ? ?History of mild persistent asthma, followed by PCP. ? ?Preop labs reviewed, unremarkable. ? ?EKG 09/11/2021: NSR.  Rate 80.  Inferior infarct, age undetermined. ? ?Nuclear stress 07/04/20: ?? Nuclear stress EF: 51%. ?? There was no ST segment deviation noted during stress. ?? Defect 1: There is a small defect of mild severity present in the basal inferior and mid inferior location. ?? This is a low risk study. ?? The left ventricular ejection fraction is mildly decreased (45-54%). ?? No evidence of ischemia or MI. ?? Diaphragmatic attenuaton noted. ?? Low normal EF. ? ?TTE 11/11/2018: ?? Nuclear stress EF: 51%. ?? There was no ST segment deviation noted during stress. ?? Defect 1: There is a small defect of mild severity present in the basal inferior and mid inferior location. ?? This is a low risk study. ?? The left ventricular ejection fraction is mildly decreased (45-54%). ?? No evidence of ischemia or MI. ?? Diaphragmatic attenuaton noted. ?? Low normal EF. ? ?)  ? ? ? ? ? ?  Anesthesia Quick Evaluation ? ?

## 2021-10-10 ENCOUNTER — Other Ambulatory Visit: Payer: Self-pay | Admitting: Internal Medicine

## 2021-10-10 DIAGNOSIS — J45909 Unspecified asthma, uncomplicated: Secondary | ICD-10-CM

## 2021-10-11 ENCOUNTER — Ambulatory Visit (HOSPITAL_COMMUNITY): Payer: Medicare Other | Admitting: Physician Assistant

## 2021-10-11 ENCOUNTER — Encounter (HOSPITAL_COMMUNITY)
Admission: RE | Disposition: A | Discharge status: Home / Self Care | Payer: Self-pay | Source: Home / Self Care | Attending: Orthopedic Surgery

## 2021-10-11 ENCOUNTER — Ambulatory Visit (HOSPITAL_BASED_OUTPATIENT_CLINIC_OR_DEPARTMENT_OTHER): Payer: Medicare Other | Admitting: Anesthesiology

## 2021-10-11 ENCOUNTER — Other Ambulatory Visit: Payer: Self-pay

## 2021-10-11 ENCOUNTER — Ambulatory Visit (HOSPITAL_COMMUNITY)
Admission: RE | Admit: 2021-10-11 | Discharge: 2021-10-11 | Disposition: A | Discharge status: Home / Self Care | Payer: Medicare Other | Attending: Orthopedic Surgery | Admitting: Orthopedic Surgery

## 2021-10-11 ENCOUNTER — Encounter (HOSPITAL_COMMUNITY): Payer: Self-pay | Admitting: Orthopedic Surgery

## 2021-10-11 DIAGNOSIS — I2510 Atherosclerotic heart disease of native coronary artery without angina pectoris: Secondary | ICD-10-CM | POA: Insufficient documentation

## 2021-10-11 DIAGNOSIS — I10 Essential (primary) hypertension: Secondary | ICD-10-CM | POA: Diagnosis not present

## 2021-10-11 DIAGNOSIS — Z955 Presence of coronary angioplasty implant and graft: Secondary | ICD-10-CM | POA: Insufficient documentation

## 2021-10-11 DIAGNOSIS — I252 Old myocardial infarction: Secondary | ICD-10-CM | POA: Insufficient documentation

## 2021-10-11 DIAGNOSIS — K219 Gastro-esophageal reflux disease without esophagitis: Secondary | ICD-10-CM | POA: Insufficient documentation

## 2021-10-11 DIAGNOSIS — F32A Depression, unspecified: Secondary | ICD-10-CM | POA: Diagnosis not present

## 2021-10-11 DIAGNOSIS — Z79899 Other long term (current) drug therapy: Secondary | ICD-10-CM | POA: Insufficient documentation

## 2021-10-11 DIAGNOSIS — J449 Chronic obstructive pulmonary disease, unspecified: Secondary | ICD-10-CM | POA: Insufficient documentation

## 2021-10-11 DIAGNOSIS — Z4542 Encounter for adjustment and management of neuropacemaker (brain) (peripheral nerve) (spinal cord): Secondary | ICD-10-CM

## 2021-10-11 DIAGNOSIS — F419 Anxiety disorder, unspecified: Secondary | ICD-10-CM | POA: Diagnosis not present

## 2021-10-11 HISTORY — PX: SPINAL CORD STIMULATOR BATTERY EXCHANGE: SHX6202

## 2021-10-11 SURGERY — SPINAL CORD STIMULATOR BATTERY EXCHANGE
Anesthesia: General

## 2021-10-11 MED ORDER — PROPOFOL 10 MG/ML IV BOLUS
INTRAVENOUS | Status: AC
  Filled 2021-10-11: qty 20

## 2021-10-11 MED ORDER — ONDANSETRON HCL 4 MG/2ML IJ SOLN
INTRAMUSCULAR | Status: DC | PRN
  Administered 2021-10-11: 4 mg via INTRAVENOUS

## 2021-10-11 MED ORDER — AMISULPRIDE (ANTIEMETIC) 5 MG/2ML IV SOLN
10.0000 mg | Freq: Once | INTRAVENOUS | Status: AC | PRN
  Administered 2021-10-11: 10 mg via INTRAVENOUS

## 2021-10-11 MED ORDER — LIDOCAINE 2% (20 MG/ML) 5 ML SYRINGE
INTRAMUSCULAR | Status: DC | PRN
  Administered 2021-10-11: 60 mg via INTRAVENOUS

## 2021-10-11 MED ORDER — ORAL CARE MOUTH RINSE: 15.0000 mL | Freq: Once | OROMUCOSAL | Status: AC

## 2021-10-11 MED ORDER — PHENYLEPHRINE 80 MCG/ML (10ML) SYRINGE FOR IV PUSH (FOR BLOOD PRESSURE SUPPORT)
PREFILLED_SYRINGE | INTRAVENOUS | Status: AC
  Filled 2021-10-11: qty 10

## 2021-10-11 MED ORDER — CHLORHEXIDINE GLUCONATE 0.12 % MT SOLN
OROMUCOSAL | Status: AC
  Administered 2021-10-11: 15 mL via OROMUCOSAL
  Filled 2021-10-11: qty 15

## 2021-10-11 MED ORDER — OXYCODONE HCL 5 MG PO TABS: 5.0000 mg | ORAL_TABLET | Freq: Once | ORAL | Status: DC | PRN

## 2021-10-11 MED ORDER — AMISULPRIDE (ANTIEMETIC) 5 MG/2ML IV SOLN
INTRAVENOUS | Status: AC
  Filled 2021-10-11: qty 4

## 2021-10-11 MED ORDER — PROMETHAZINE HCL 25 MG/ML IJ SOLN: 6.2500 mg | INTRAMUSCULAR | Status: DC | PRN

## 2021-10-11 MED ORDER — BUPIVACAINE LIPOSOME 1.3 % IJ SUSP
INTRAMUSCULAR | Status: AC
  Filled 2021-10-11: qty 20

## 2021-10-11 MED ORDER — ONDANSETRON HCL 4 MG PO TABS: 4.0000 mg | ORAL_TABLET | Freq: Three times a day (TID) | ORAL | 0 refills | Status: DC | PRN

## 2021-10-11 MED ORDER — DEXAMETHASONE SODIUM PHOSPHATE 10 MG/ML IJ SOLN
INTRAMUSCULAR | Status: DC | PRN
  Administered 2021-10-11: 4 mg via INTRAVENOUS

## 2021-10-11 MED ORDER — CEFAZOLIN SODIUM-DEXTROSE 2-4 GM/100ML-% IV SOLN
2.0000 g | INTRAVENOUS | Status: AC
  Administered 2021-10-11: 2 g via INTRAVENOUS

## 2021-10-11 MED ORDER — OXYCODONE-ACETAMINOPHEN 10-325 MG PO TABS: 1.0000 | ORAL_TABLET | Freq: Four times a day (QID) | ORAL | 0 refills | Status: AC | PRN

## 2021-10-11 MED ORDER — BUPIVACAINE-EPINEPHRINE 0.25% -1:200000 IJ SOLN
INTRAMUSCULAR | Status: DC | PRN
  Administered 2021-10-11: 20 mL
  Administered 2021-10-11: 10 mL

## 2021-10-11 MED ORDER — BUPIVACAINE-EPINEPHRINE (PF) 0.25% -1:200000 IJ SOLN
INTRAMUSCULAR | Status: AC
  Filled 2021-10-11: qty 30

## 2021-10-11 MED ORDER — PHENYLEPHRINE 80 MCG/ML (10ML) SYRINGE FOR IV PUSH (FOR BLOOD PRESSURE SUPPORT)
PREFILLED_SYRINGE | INTRAVENOUS | Status: DC | PRN
  Administered 2021-10-11 (×5): 80 ug via INTRAVENOUS

## 2021-10-11 MED ORDER — BUPIVACAINE HCL (PF) 0.5 % IJ SOLN
INTRAMUSCULAR | Status: DC | PRN
  Administered 2021-10-11: 20 mL

## 2021-10-11 MED ORDER — CHLORHEXIDINE GLUCONATE 0.12 % MT SOLN: 15.0000 mL | Freq: Once | OROMUCOSAL | Status: AC

## 2021-10-11 MED ORDER — LACTATED RINGERS IV SOLN: INTRAVENOUS | Status: DC

## 2021-10-11 MED ORDER — THROMBIN (RECOMBINANT) 20000 UNITS EX SOLR
CUTANEOUS | Status: AC
  Filled 2021-10-11: qty 20000

## 2021-10-11 MED ORDER — OXYCODONE HCL 5 MG/5ML PO SOLN: 5.0000 mg | Freq: Once | ORAL | Status: DC | PRN

## 2021-10-11 MED ORDER — CEFAZOLIN SODIUM-DEXTROSE 2-4 GM/100ML-% IV SOLN
INTRAVENOUS | Status: AC
  Filled 2021-10-11: qty 100

## 2021-10-11 MED ORDER — 0.9 % SODIUM CHLORIDE (POUR BTL) OPTIME
TOPICAL | Status: DC | PRN
  Administered 2021-10-11: 1000 mL

## 2021-10-11 MED ORDER — FENTANYL CITRATE (PF) 250 MCG/5ML IJ SOLN
INTRAMUSCULAR | Status: AC
  Filled 2021-10-11: qty 5

## 2021-10-11 MED ORDER — SUCCINYLCHOLINE CHLORIDE 200 MG/10ML IV SOSY
PREFILLED_SYRINGE | INTRAVENOUS | Status: DC | PRN
  Administered 2021-10-11: 120 mg via INTRAVENOUS

## 2021-10-11 MED ORDER — DEXAMETHASONE SODIUM PHOSPHATE 10 MG/ML IJ SOLN
INTRAMUSCULAR | Status: AC
  Filled 2021-10-11: qty 1

## 2021-10-11 MED ORDER — SUCCINYLCHOLINE CHLORIDE 200 MG/10ML IV SOSY
PREFILLED_SYRINGE | INTRAVENOUS | Status: AC
  Filled 2021-10-11: qty 10

## 2021-10-11 MED ORDER — HYDROMORPHONE HCL 1 MG/ML IJ SOLN: 0.2500 mg | INTRAMUSCULAR | Status: DC | PRN

## 2021-10-11 MED ORDER — FENTANYL CITRATE (PF) 250 MCG/5ML IJ SOLN
INTRAMUSCULAR | Status: DC | PRN
  Administered 2021-10-11: 100 ug via INTRAVENOUS

## 2021-10-11 MED ORDER — ONDANSETRON HCL 4 MG/2ML IJ SOLN
INTRAMUSCULAR | Status: AC
  Filled 2021-10-11: qty 2

## 2021-10-11 MED ORDER — PROPOFOL 10 MG/ML IV BOLUS
INTRAVENOUS | Status: DC | PRN
  Administered 2021-10-11: 150 mg via INTRAVENOUS

## 2021-10-11 MED ORDER — ROCURONIUM BROMIDE 10 MG/ML (PF) SYRINGE
PREFILLED_SYRINGE | INTRAVENOUS | Status: AC
  Filled 2021-10-11: qty 10

## 2021-10-11 MED ORDER — ROCURONIUM BROMIDE 10 MG/ML (PF) SYRINGE
PREFILLED_SYRINGE | INTRAVENOUS | Status: DC | PRN
  Administered 2021-10-11: 30 mg via INTRAVENOUS

## 2021-10-11 MED ORDER — LIDOCAINE 2% (20 MG/ML) 5 ML SYRINGE
INTRAMUSCULAR | Status: AC
  Filled 2021-10-11: qty 5

## 2021-10-11 SURGICAL SUPPLY — 53 items
BAG COUNTER SPONGE SURGICOUNT (BAG) ×1 IMPLANT
BAG SPNG CNTER NS LX DISP (BAG)
CANISTER SUCT 3000ML PPV (MISCELLANEOUS) ×2 IMPLANT
CLSR STERI-STRIP ANTIMIC 1/2X4 (GAUZE/BANDAGES/DRESSINGS) ×2 IMPLANT
CONTROLLER NEUROSTIM PATIENT (NEUROSURGERY SUPPLIES) ×1 IMPLANT
CORD BIPOLAR FORCEPS 12FT (ELECTRODE) ×2 IMPLANT
DRAIN CHANNEL 15F RND FF W/TCR (WOUND CARE) IMPLANT
DRAPE INCISE IOBAN 66X45 STRL (DRAPES) ×2 IMPLANT
DRAPE LAPAROTOMY 100X72 PEDS (DRAPES) ×2 IMPLANT
DRAPE POUCH INSTRU U-SHP 10X18 (DRAPES) ×2 IMPLANT
DRAPE SURG 17X23 STRL (DRAPES) ×2 IMPLANT
DRAPE U-SHAPE 47X51 STRL (DRAPES) ×2 IMPLANT
DRSG AQUACEL AG ADV 3.5X 6 (GAUZE/BANDAGES/DRESSINGS) ×2 IMPLANT
DRSG OPSITE POSTOP 4X6 (GAUZE/BANDAGES/DRESSINGS) ×2 IMPLANT
DURAPREP 26ML APPLICATOR (WOUND CARE) ×2 IMPLANT
ELECT CAUTERY BLADE 6.4 (BLADE) ×2 IMPLANT
ELECT PENCIL ROCKER SW 15FT (MISCELLANEOUS) ×2 IMPLANT
ELECT REM PT RETURN 9FT ADLT (ELECTROSURGICAL) ×2
ELECTRODE REM PT RTRN 9FT ADLT (ELECTROSURGICAL) ×1 IMPLANT
GENERATOR PULSE PROCLAIM 5ELIT (Neuro Prosthesis/Implant) IMPLANT
GLOVE BIO SURGEON STRL SZ 6.5 (GLOVE) ×2 IMPLANT
GLOVE BIOGEL PI IND STRL 6.5 (GLOVE) ×1 IMPLANT
GLOVE BIOGEL PI IND STRL 8.5 (GLOVE) ×1 IMPLANT
GLOVE BIOGEL PI INDICATOR 6.5 (GLOVE) ×1
GLOVE BIOGEL PI INDICATOR 8.5 (GLOVE) ×1
GLOVE SS BIOGEL STRL SZ 8.5 (GLOVE) ×2 IMPLANT
GLOVE SUPERSENSE BIOGEL SZ 8.5 (GLOVE) ×2
GOWN STRL REUS W/ TWL LRG LVL3 (GOWN DISPOSABLE) ×2 IMPLANT
GOWN STRL REUS W/TWL 2XL LVL3 (GOWN DISPOSABLE) ×2 IMPLANT
GOWN STRL REUS W/TWL LRG LVL3 (GOWN DISPOSABLE) ×4
KIT BASIN OR (CUSTOM PROCEDURE TRAY) ×2 IMPLANT
KIT TURNOVER KIT B (KITS) ×2 IMPLANT
MAGNET NEUROSTIM ETERNA PAT (NEUROSURGERY SUPPLIES) ×1 IMPLANT
NDL SUT 6 .5 CRC .975X.05 MAYO (NEEDLE) ×1 IMPLANT
NEEDLE 22X1 1/2 (OR ONLY) (NEEDLE) IMPLANT
NEEDLE MAYO TAPER (NEEDLE) ×2
NS IRRIG 1000ML POUR BTL (IV SOLUTION) ×2 IMPLANT
PACK GENERAL/GYN (CUSTOM PROCEDURE TRAY) ×2 IMPLANT
PACK UNIVERSAL I (CUSTOM PROCEDURE TRAY) ×2 IMPLANT
PAD ARMBOARD 7.5X6 YLW CONV (MISCELLANEOUS) ×4 IMPLANT
PULSE GENERATOR PROCLAIM 5ELIT (Neuro Prosthesis/Implant) ×2 IMPLANT
SPONGE T-LAP 4X18 ~~LOC~~+RFID (SPONGE) ×2 IMPLANT
STAPLER VISISTAT 35W (STAPLE) IMPLANT
SUT ETHIBOND NAB CT1 #1 30IN (SUTURE) ×4 IMPLANT
SUT MNCRL AB 3-0 PS2 27 (SUTURE) ×4 IMPLANT
SUT VIC AB 1 CT1 18XCR BRD 8 (SUTURE) ×1 IMPLANT
SUT VIC AB 1 CT1 8-18 (SUTURE) ×2
SUT VIC AB 2-0 CT1 18 (SUTURE) ×4 IMPLANT
SYR BULB IRRIG 60ML STRL (SYRINGE) ×2 IMPLANT
SYR CONTROL 10ML LL (SYRINGE) IMPLANT
TOWEL GREEN STERILE (TOWEL DISPOSABLE) ×2 IMPLANT
TOWEL GREEN STERILE FF (TOWEL DISPOSABLE) ×4 IMPLANT
WATER STERILE IRR 1000ML POUR (IV SOLUTION) ×2 IMPLANT

## 2021-10-11 NOTE — Brief Op Note (Signed)
10/11/2021 ? ?11:26 AM ? ?PATIENT:  [name]  76 y.o. male ? 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 ?PROCEDURE:  Procedure(s) with comments: ?SPINAL CORD STIMULATOR BATTERY EXCHANGE (N/A) - 60 mins ? ?SURGEON:  Surgeon(s) and Role: ?   Venita Lick, MD - Primary ? ?PHYSICIAN ASSISTANT:  ? ?ASSISTANTS: none  ? ?ANESTHESIA:   general ? ?EBL:  5 mL  ? ?BLOOD ADMINISTERED:none ? ?DRAINS: none  ? ?LOCAL MEDICATIONS USED:  MARCAINE    and OTHER exparel ? ?SPECIMEN:  No Specimen ? ?DISPOSITION OF SPECIMEN:  N/A ? ?COUNTS:  YES ? ?TOURNIQUET:  * No tourniquets in log * ? ?DICTATION: .Dragon Dictation ? ?PLAN OF CARE: Discharge to home after PACU ? ?PATIENT DISPOSITION:  PACU - hemodynamically stable. ?  ? ?

## 2021-10-11 NOTE — H&P (Signed)
? ? ? ?History: ?Worthy presents for replacement of his spinal cord stimulator battery. Patient has had a spinal cord stimulator in place for some time now and has been very effective in managing his pain. He recently noted that the overall effectiveness of the spinal cord stimulator has begun to deteriorate. Interrogation of the battery by the rep indicated that it is no longer functioning properly and does not require replacement. ? ? ?Past Medical History:  ?Diagnosis Date  ? ADD (attention deficit disorder)   ? Allergic rhinitis   ? skin test 02/23/08 vaccine 2009; allergy shots weekly through Dr. Maple Hudson  ? Anemia   ? takes Fe -   ? Anginal pain (HCC)   ? Ankylosing spondylitis (HCC)   ? Anxiety   ? sees therapist Vernell Leep  ? Arthritis   ? Asthma   ? Dr. Jetty Duhamel  ? 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 CAD (coronary artery disease)   ? Dr. Donnie Aho  ? COPD (chronic obstructive pulmonary disease) (HCC)   ? Depression   ? Depression with anxiety   ? Erectile dysfunction   ? GERD (gastroesophageal reflux disease)   ? History of benign positional vertigo   ? History of kidney stones   ? Hyperlipidemia   ? Hypertension   ? Hypogonadism male   ? prior use on Testim  ? Insomnia   ? prior borderline sleep study  ? Left ventricular dysfunction 10/12/2018  ? Lumbar disc disease   ? Mild concentric left ventricular hypertrophy (LVH)   ? 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 Pneumonia   ? Shortness of breath dyspnea   ? ? ?No Active Allergies ? ?No current facility-administered medications on file prior to encounter.  ? ?Current Outpatient Medications on File Prior to Encounter  ?Medication Sig Dispense Refill  ? ALPRAZolam (XANAX) 0.25 MG tablet TAKE 1 TABLET BY MOUTH TWICE A DAY AS NEEDED 30 tablet 1  ? buPROPion (WELLBUTRIN XL) 300 MG 24 hr tablet Take 300 mg by mouth daily.    ? calcium carbonate (OS-CAL) 600 MG TABS tablet Take 600 mg by mouth daily with breakfast.    ? cetirizine  (ZYRTEC) 10 MG tablet Take 10 mg by mouth daily.    ? clopidogrel (PLAVIX) 75 MG tablet TAKE 1 TABLET BY MOUTH  DAILY 90 tablet 3  ? desvenlafaxine (PRISTIQ) 50 MG 24 hr tablet TAKE 1 TABLET BY MOUTH  DAILY 90 tablet 3  ? diltiazem (CARDIZEM CD) 240 MG 24 hr capsule TAKE 1 CAPSULE BY MOUTH  DAILY 90 capsule 3  ? Flaxseed, Linseed, (FLAX SEED OIL) 1000 MG CAPS Take 1,000 mg by mouth every 7 (seven) days.    ? GLUCOSAMINE CHONDROITIN COMPLX PO Take 1 tablet by mouth daily.    ? guaiFENesin (MUCINEX) 600 MG 12 hr tablet Take 600 mg by mouth 2 (two) times daily as needed for cough.    ? meclizine (ANTIVERT) 25 MG tablet 1 twice a day as needed for dizziness 180 tablet 0  ? methylphenidate (CONCERTA) 36 MG PO CR tablet Take 1 tablet (36 mg total) by mouth daily. 90 tablet 0  ? montelukast (SINGULAIR) 10 MG tablet Take 1 tablet (10 mg total) by mouth at bedtime. 90 tablet 3  ? Multiple Vitamins-Minerals (MULTIVITAMIN WITH MINERALS) tablet Take 1 tablet by mouth daily.    ? pantoprazole (PROTONIX) 40 MG tablet Take 1 tablet (40 mg total) by mouth daily. 90 tablet 3  ?  sucralfate (CARAFATE) 1 GM/10ML suspension Take 10 mLs (1 g total) by mouth daily. (Patient taking differently: Take 1 g by mouth daily as needed (acid reflux).) 300 mL 3  ? tadalafil (CIALIS) 20 MG tablet Take 1 tablet (20 mg total) by mouth daily as needed for erectile dysfunction. 30 tablet 5  ? umeclidinium-vilanterol (ANORO ELLIPTA) 62.5-25 MCG/INH AEPB INHALE 1 PUFF BY MOUTH EVERY DAY 60 each 5  ? azelastine (ASTELIN) 0.1 % nasal spray Place 2 sprays into both nostrils 2 (two) times daily. Use in each nostril as directed (Patient taking differently: Place 2 sprays into both nostrils 2 (two) times daily as needed (congestion). Use in each nostril as directed) 90 mL 3  ? ferrous sulfate 325 (65 FE) MG tablet Take 325 mg by mouth once a week.    ? nitroGLYCERIN (NITROSTAT) 0.4 MG SL tablet Place 1 tablet (0.4 mg total) under the tongue every 5 (five)  minutes as needed. (Patient not taking: Reported on 09/28/2021) 25 tablet 11  ? PROAIR HFA 108 (90 Base) MCG/ACT inhaler TAKE 2 PUFFS BY MOUTH EVERY 4 HOURS AS NEEDED 8.5 Inhaler 2  ? ? ?Physical Exam: ?Vitals:  ? 10/11/21 0855  ?BP: 124/88  ?Pulse: 75  ?Resp: 17  ?Temp: 98.2 ?F (36.8 ?C)  ?SpO2: 97%  ? ?Body mass index is 23.96 kg/m?. ?[name] is a pleasant individual, who appears younger than their stated age. ? ?He is alert and orientated ?3. ? ?Lungs: Clear to auscultation bilaterally. No shortness of breath, chest pain. ? ?Cardiac: Regular rate and rhythm no rubs gallops murmurs ? ?Abdomen is soft and non-tender, soft and nontender loss of bowel and bladder control, no rebound tenderness. ? ?Negative: skin lesions abrasions contusions ? ?Peripheral pulses: 2+ dorsalis pedis/posterior tibialis pulses bilaterally. ?LE compartments are: Soft and nontender. ? ?Gait pattern: Slightly altered gait pattern due to chronic back pain ? ?Assistive devices: None ? ?Neuro: 5/5 motor strength in lower extremity. Positive dysesthesias and neuropathic leg pain. Negative Babinski test, no clonus, 1+ deep tendon reflexes at the knee absent at the Achilles. ? ?Musculoskeletal: Significant low back pain with palpation and range of motion. ?Spinal cord stimulator battery was interrogated by the [employer] spine rep and was found to be no longer functioning. ? ? ?Image: ?No results found. ? ?A/P: ?Ayeden presents for replacement of his spinal cord stimulator battery. Patient has had a spinal cord stimulator in place for some time now and has been very effective in managing his pain. He recently noted that the overall effectiveness of the spinal cord stimulator has begun to deteriorate. Interrogation of the battery by the rep indicated that it is no longer functioning properly and does not require replacement. ? 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 I have gone over the surgical procedure  which will be replacing the spinal cord stimulator battery. We will of course get preoperative medical clearance from his primary care physician. ? ?Risks and benefits of surgery were discussed with the patient. These include: Infection, bleeding, death, stroke, paralysis, ongoing or worse pain, need for additional surgery, Failure of the battery requiring reoperation. Damage injury to the leads requiring the surgery to be aborted. Migration of the lead, failure to obtain similar results. ? ?Treatment plan: Plan on spinal cord stimulator battery replacement ?

## 2021-10-11 NOTE — Anesthesia Procedure Notes (Signed)
Procedure Name: Intubation ?Date/Time: 10/11/2021 10:41 AM ?Performed by: Lovie Chol, CRNA ?Pre-anesthesia Checklist: Patient identified, Emergency Drugs available, Suction available and Patient being monitored ?Patient Re-evaluated:Patient Re-evaluated prior to induction ?Oxygen Delivery Method: Circle System Utilized ?Preoxygenation: Pre-oxygenation with 100% oxygen ?Induction Type: IV induction ?Ventilation: Mask ventilation without difficulty ?Laryngoscope Size: Hyacinth Meeker and 3 ?Grade View: Grade I ?Tube type: Oral ?Tube size: 7.0 mm ?Number of attempts: 1 ?Airway Equipment and Method: Stylet ?Placement Confirmation: ETT inserted through vocal cords under direct vision, positive ETCO2 and breath sounds checked- equal and bilateral ?Secured at: 22 cm ?Tube secured with: Tape ?Dental Injury: Teeth and Oropharynx as per pre-operative assessment  ? ? ? ? ?

## 2021-10-11 NOTE — Op Note (Signed)
OPERATIVE REPORT ? ?DATE OF SURGERY: 10/11/2021 ? ?PATIENT NAME:  [name] ?MRN: [id number] ?DOB: August 24, 1945 ? ?PCP: Ronnald Nian, MD ? ?PRE-OPERATIVE DIAGNOSIS: End-of-life of spinal cord stimulator battery ? ?POST-OPERATIVE DIAGNOSIS: Same ? ?PROCEDURE:   ?Replacement of spinal cord stimulator battery ? ?SURGEON:  Venita Lick, MD ? ?PHYSICIAN ASSISTANT: None ? ?ANESTHESIA:   General ? ?EBL: Minimal ? ?Complications: None ? ?Implants: Proclaim XR battery ? ?BRIEF HISTORY: ?[name] is a 76 y.o. male who has had a spinal cord stimulator in place for approximately 6 years.  Recently the battery failed to function properly.  The battery was interrogated and noted to be at end-of-life.  As result we elected to move forward with replacement.  All appropriate risks, benefits, and alternatives were discussed with the patient and consent was obtained. ? ?PROCEDURE DETAILS: ?Patient was brought into the operating room and was properly positioned on the operating room table.  After induction with general anesthesia the patient was endotracheally intubated.  A timeout was taken to confirm all important data: including patient, procedure, and the level. Teds, SCD's were applied.  ? ?Patient was turned prone onto the Wilson frame and the right flank region where the battery was inserted was prepped and draped in a standard fashion the previous incision site was infiltrated with quarter percent Marcaine with epinephrine and then reincised.  I sharply dissected down to the battery and then gently delivered out of the wound.  The leads were separated and the wound irrigated with normal saline.  I made sure that hemostasis with Bovie.  At this point the electrocautery was disconnected and the replacement battery was brought onto the field.  The leads were then repositioned into the new battery and secured according manufacture standards.  The battery was then reinserted with the excess lead on the  undersurface I secured the battery to the deep fascia with two #1 Vicryl sutures.  The wound was irrigated copiously normal saline and then I closed in a layered fashion with interrupted 1 Vicryl suture, 2-0 Vicryl suture, and a 3-0 Monocryl for the skin.  I then injected the area with quarter percent Marcaine with Exparel for postoperative analgesia.  The battery was tested once again and was noted to be functioning.  Dry dressings were applied and the patient was extubated and transferred the PACU without incident.  The end of the case all needle and sponge counts were correct.  There were no adverse intraoperative events. ? ? ?Venita Lick, MD ?10/11/2021 ?11:22 AM ? ? ?

## 2021-10-11 NOTE — Transfer of Care (Signed)
Immediate Anesthesia Transfer of Care Note ? ?Patient: [name] ? ?Procedure(s) Performed: SPINAL CORD STIMULATOR BATTERY EXCHANGE ? ?Patient Location: PACU ? ?Anesthesia Type:General ? ?Level of Consciousness: oriented, drowsy and patient cooperative ? ?Airway & Oxygen Therapy: Patient Spontanous Breathing and Patient connected to nasal cannula oxygen ? ?Post-op Assessment: Report given to RN and Post -op Vital signs reviewed and stable ? ?Post vital signs: Reviewed ? ?Last Vitals:  ?Vitals Value Taken Time  ?BP 118/71 10/11/21 1129  ?Temp    ?Pulse 84 10/11/21 1130  ?Resp 15 10/11/21 1130  ?SpO2 99 % 10/11/21 1130  ?Vitals shown include unvalidated device data. ? ?Last Pain:  ?Vitals:  ? 10/11/21 0909  ?TempSrc:   ?PainSc: 0-No pain  ?   ? ?  ? ?Complications: No notable events documented. ?

## 2021-10-11 NOTE — Discharge Instructions (Addendum)
? ?Spinal Cord Stimulator Replacement Care After ?This sheet gives you information about how to care for yourself after your procedure. Your health care provider may also give you more specific instructions. If you have problems or questions, contact your health care provider. ?What can I expect after the procedure? ?After the procedure, it is common to have: ?Soreness or pain. ?Some swelling in the area where the hardware was removed. ?A small amount of blood or clear fluid coming from your incision. ?Follow these instructions at home: ?If you have a cast: ?Do not stick anything inside the cast to scratch your skin. Doing that increases your risk of infection. ?Check the skin around the cast every day. Tell your health care provider about any concerns. ?You may put lotion on dry skin around the edges of the cast. Do not put lotion on the skin underneath the cast. ?Keep the cast clean and dry. ?Do not take baths, swim, or use a hot tub until your health care provider approves. Ask your health care provider if you may take showers. You may only be allowed to take sponge baths. ?Keep the bandage (dressing) dry until your health care provider says it can be removed. ?Ok to shower in 5 days.   ? ?Incision care ? ?Follow instructions from your health care provider about how to take care of your incision. Make sure you: ?Wash your hands with soap and water before you change your dressing. If soap and water are not available, use hand sanitizer. ?Change your dressing as told by your health care provider. ?Leave stitches (sutures), skin glue, or adhesive strips in place. These skin closures may need to stay in place for 2 weeks or longer. If adhesive strip edges start to loosen and curl up, you may trim the loose edges. Do not remove adhesive strips completely unless your health care provider tells you to do that. ?Check your incision area every day for signs of infection. Check for: ?Redness. ?More swelling or pain. ?More  fluid or blood. ?Warmth. ?Pus or a bad smell. ?Managing pain, stiffness, and swelling ? ?If directed, put ice on the affected area: ?Put ice in a plastic bag. ?Place a towel between your skin and the bag. ?Leave the ice on for 20 minutes, 2-3 times a day. ?Move your fingers or toes often to avoid stiffness and to lessen swelling. ? ?Driving ?Do not drive or use heavy machinery while taking prescription pain medicine. ?Do not drive for 24 hours if you were given a medicine to help you relax (sedative) during your procedure. ?Ask your health care provider when it is safe to drive if you have a cast, splint, or boot on the affected limb. ?Activity ?Ask your health care provider what activities are safe for you during recovery, and ask what activities you need to avoid. ?Do not use the injured limb to support your body weight until your health care provider says that you can. ?Do not play contact sports until your health care provider approves. ?Do exercises as told by your health care provider. ?Avoid sitting for a long time without moving. Get up and move around at least every few hours. This will help prevent blood clots. ?General instructions ?Do not put pressure on any part of the cast or splint until it is fully hardened. This may take several hours. ?If you are taking prescription pain medicine, take actions to prevent or treat constipation. Your health care provider may recommend that you: ?Drink enough fluid to keep your  urine pale yellow. ?Eat foods that are high in fiber, such as fresh fruits and vegetables, whole grains, and beans. ?Limit foods that are high in fat and processed sugars, such as fried or sweet foods. ?Take an over-the-counter or prescription medicine for constipation. ?Do not use any products that contain nicotine or tobacco, such as cigarettes and e-cigarettes. These can delay bone healing after surgery. If you need help quitting, ask your health care provider. ?Take over-the-counter and  prescription medicines only as told by your health care provider. ?Keep all follow-up visits as told by your health care provider. This is important. ?Contact a health care provider if: ?You have lasting pain. ?You have redness around your incision. ?You have more swelling or pain around your incision. ?You have more fluid or blood coming from your incision. ?Your incision feels warm to the touch. ?You have pus or a bad smell coming from your incision. ?You are unable to do exercises or physical activity as told by your health care provider. ?Get help right away if: ?You have difficulty breathing. ?You have chest pain. ?You have severe pain. ?You have a fever or chills. ?You have numbness for more than 24 hours in the area where the hardware was removed. ?Summary ?After the procedure, it is common to have some pain and swelling in the area where the hardware was removed. ?Follow instructions from your health care provider about how to take care of your incision. ?Return to your normal activities as told by your health care provider. Ask your health care provider what activities are safe for you. ?This information is not intended to replace advice given to you by your health care provider. Make sure you discuss any questions you have with your health care provider.  ? ? ?RESTART PLAVIX ON Saturday 10/13/21 ?

## 2021-10-11 NOTE — Anesthesia Postprocedure Evaluation (Signed)
Anesthesia Post Note ? ?Patient: [name] ? ?Procedure(s) Performed: SPINAL CORD STIMULATOR BATTERY EXCHANGE ? ?  ? ?Patient location during evaluation: PACU ?Anesthesia Type: General ?Level of consciousness: awake and alert ?Pain management: pain level controlled ?Vital Signs Assessment: post-procedure vital signs reviewed and stable ?Respiratory status: spontaneous breathing, nonlabored ventilation and respiratory function stable ?Cardiovascular status: blood pressure returned to baseline and stable ?Postop Assessment: no apparent nausea or vomiting ?Anesthetic complications: no ? ? ?No notable events documented. ? ?Last Vitals:  ?Vitals:  ? 10/11/21 1145 10/11/21 1200  ?BP: 116/75 116/73  ?Pulse: 76 71  ?Resp: 20 15  ?Temp:  (!) 36.4 ?C  ?SpO2: 97% 97%  ?  ?Last Pain:  ?Vitals:  ? 10/11/21 1200  ?TempSrc:   ?PainSc: 0-No pain  ? ? ?  ?  ?  ?  ?  ?  ? ?Lowella Curb ? ? ? ? ?

## 2021-10-12 ENCOUNTER — Other Ambulatory Visit: Payer: Self-pay | Admitting: Internal Medicine

## 2021-10-12 ENCOUNTER — Encounter (HOSPITAL_COMMUNITY): Payer: Self-pay | Admitting: Orthopedic Surgery

## 2021-10-12 DIAGNOSIS — J45909 Unspecified asthma, uncomplicated: Secondary | ICD-10-CM

## 2021-10-17 ENCOUNTER — Other Ambulatory Visit: Payer: Self-pay | Admitting: Family Medicine

## 2021-10-17 NOTE — Telephone Encounter (Signed)
[facility] is requesting wellbutrin. Please advised kh ?

## 2021-11-12 ENCOUNTER — Telehealth: Payer: Self-pay | Admitting: Cardiology

## 2021-11-12 NOTE — Telephone Encounter (Signed)
Pt states that he is to come and pick up a copy of a letter for insurance purposes. Pt needs to know which office to pick letter up at. Please advise

## 2021-11-12 NOTE — Telephone Encounter (Signed)
Called patient and informed him that his letter was at the [facility] office and was ready for pick-up.

## 2021-11-14 ENCOUNTER — Encounter: Payer: Self-pay | Admitting: Medical

## 2021-11-14 ENCOUNTER — Ambulatory Visit (INDEPENDENT_AMBULATORY_CARE_PROVIDER_SITE_OTHER): Payer: Medicare Other | Admitting: Medical

## 2021-11-14 VITALS — BP 118/72 | HR 72 | Ht 67.0 in | Wt 159.8 lb

## 2021-11-14 DIAGNOSIS — M5440 Lumbago with sciatica, unspecified side: Secondary | ICD-10-CM | POA: Diagnosis not present

## 2021-11-14 DIAGNOSIS — M519 Unspecified thoracic, thoracolumbar and lumbosacral intervertebral disc disorder: Secondary | ICD-10-CM

## 2021-11-14 DIAGNOSIS — G8929 Other chronic pain: Secondary | ICD-10-CM

## 2021-11-14 DIAGNOSIS — M62838 Other muscle spasm: Secondary | ICD-10-CM

## 2021-11-14 DIAGNOSIS — M4316 Spondylolisthesis, lumbar region: Secondary | ICD-10-CM

## 2021-11-14 DIAGNOSIS — Z9689 Presence of other specified functional implants: Secondary | ICD-10-CM | POA: Insufficient documentation

## 2021-11-14 DIAGNOSIS — M79605 Pain in left leg: Secondary | ICD-10-CM

## 2021-11-14 HISTORY — DX: Presence of other specified functional implants: Z96.89

## 2021-11-14 HISTORY — DX: Pain in left leg: M79.605

## 2021-11-14 HISTORY — DX: Other chronic pain: G89.29

## 2021-11-14 HISTORY — DX: Other muscle spasm: M62.838

## 2021-11-14 MED ORDER — TIZANIDINE HCL 4 MG PO TABS: 4.0000 mg | ORAL_TABLET | Freq: Two times a day (BID) | ORAL | 0 refills | Status: DC | PRN

## 2021-11-14 NOTE — Progress Notes (Signed)
Subjective:  [name] is a 76 y.o. male who presents for Chief Complaint  Patient presents with   Leg Pain    Left leg pain x 4 days. Behind knee and up his thigh. Saw chiropractor last week and Mon and Tues this week-did not help. Took some Tylenol and did not help. Has a spinal stim and had to have surgery to get battery replaced-just mentioned but does not think they are related.      Here for leg pain.  He normally sees Dr. Susann Givens here for care  He also sees Dr. Venita Lick at [facility] who recently did surgery for spinal cord stimulator.  He has a history of chronic back pain, history of spinal cord stimulator, history of degenerative disc disease.  He denies any recent change in his back pain but does have chronic back pain.  He does get sciatica from time to time and a few weeks ago felt sciatica on his left side when he was lying flat in the bed.  In the last 4 days that he has had pains in the left leg behind the knee radiating from mid thigh to mid calf.  No numbness or tingling or weakness.  No recent fall or trauma or injury.  No fever.  He has pain medicine prescribed to orthopedics but has not taken it for this.  No saddle anesthesia, no incontinence.  No other aggravating or relieving factors.    No other c/o.  Past Medical History:  Diagnosis Date   ADD (attention deficit disorder)    Allergic rhinitis    skin test 02/23/08 vaccine 2009; allergy shots weekly through Dr. Maple Hudson   Anemia    takes Fe -    Anginal pain (HCC)    Ankylosing spondylitis (HCC)    Anxiety    sees therapist Vernell Leep   Arthritis    Asthma    Dr. 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     cetirizine (ZYRTEC) 10 MG tablet Take 10 mg by mouth daily.     clopidogrel (PLAVIX) 75 MG tablet TAKE 1 TABLET BY MOUTH  DAILY 90 tablet 3   desvenlafaxine (PRISTIQ) 50 MG 24 hr tablet TAKE 1 TABLET BY MOUTH  DAILY 90 tablet 3   diltiazem (CARDIZEM CD) 240 MG 24 hr capsule TAKE  1 CAPSULE BY MOUTH  DAILY 90 capsule 3   guaiFENesin (MUCINEX) 600 MG 12 hr tablet Take 600 mg by mouth 2 (two) times daily as needed for cough.     methylphenidate (CONCERTA) 36 MG PO CR tablet Take 1 tablet (36 mg total) by mouth daily. 90 tablet 0   Multiple Vitamins-Minerals (MULTIVITAMIN WITH MINERALS) tablet Take 1 tablet by mouth daily.     ALPRAZolam (XANAX) 0.25 MG tablet TAKE 1 TABLET BY MOUTH TWICE A DAY AS NEEDED (Patient not taking: Reported on 11/14/2021) 30 tablet 1   azelastine (ASTELIN) 0.1 % nasal spray Place 2 sprays into both nostrils 2 (two) times daily. Use in each nostril as directed (Patient not taking: Reported on 11/14/2021) 90 mL 3   ferrous sulfate 325 (65 FE) MG tablet Take 325 mg by mouth once a week. (Patient not taking: Reported on 11/14/2021)     Flaxseed, Linseed, (FLAX SEED OIL) 1000 MG CAPS Take 1,000 mg by mouth every 7 (seven) days. (Patient not taking: Reported on 11/14/2021)     GLUCOSAMINE  CHONDROITIN COMPLX PO Take 1 tablet by mouth daily. (Patient not taking: Reported on 11/14/2021)     meclizine (ANTIVERT) 25 MG tablet 1 twice a day as needed for dizziness (Patient not taking: Reported on 11/14/2021) 180 tablet 0   montelukast (SINGULAIR) 10 MG tablet Take 1 tablet (10 mg total) by mouth at bedtime. (Patient not taking: Reported on 11/14/2021) 90 tablet 3   pantoprazole (PROTONIX) 40 MG tablet Take 1 tablet (40 mg total) by mouth daily. (Patient not taking: Reported on 11/14/2021) 90 tablet 3   PROAIR HFA 108 (90 Base) MCG/ACT inhaler TAKE 2 PUFFS BY MOUTH EVERY 4 HOURS AS NEEDED (Patient not taking: Reported on 11/14/2021) 8.5 Inhaler 2   sucralfate (CARAFATE) 1 GM/10ML suspension Take 10 mLs (1 g total) by mouth daily. (Patient not taking: Reported on 11/14/2021) 300 mL 3   umeclidinium-vilanterol (ANORO ELLIPTA) 62.5-25 MCG/INH AEPB INHALE 1 PUFF BY MOUTH EVERY DAY (Patient not taking: Reported on 11/14/2021) 60 each 5   No current facility-administered medications on file prior to visit.   Past Surgical History:  Procedure Laterality Date   BACK SURGERY     BRAVO [facility] STUDY  02/18/2012   Procedure: BRAVO PH STUDY;  Surgeon: Florencia Reasons, MD;  Location: [facility];  Service: Endoscopy;  Laterality: N/A;   CARDIAC CATHETERIZATION  06/10/2004   Dr. Donnie Aho   CHEILECTOMY  10/16/2011   Procedure: CHEILECTOMY;  Surgeon: Sherri Rad, MD;  Location: [facility];  Service: Orthopedics;  Laterality: Right;   CHOLECYSTECTOMY     COLONOSCOPY     CORONARY ANGIOPLASTY     1995 after MI   CORONARY ANGIOPLASTY WITH STENT PLACEMENT     1999    ESOPHAGOGASTRODUODENOSCOPY  02/18/2012   Procedure: ESOPHAGOGASTRODUODENOSCOPY (EGD);  Surgeon: Florencia Reasons, MD;  Location: [facility];  Service: Endoscopy;  Laterality: N/A;   FOOT SURGERY     joint scraped, arthritis right foot   HERNIA REPAIR     LUMBAR DISC SURGERY     x2   right index finger  mass removed- arthritis      Dr. Amanda Pea   [facility]  Right 05/18/2021   medial toe consistent with callus   SKIN BIOPSY Left 05/17/2021   actinic keratosis   SPINAL CORD STIMULATOR BATTERY EXCHANGE N/A 10/11/2021   Procedure: SPINAL CORD STIMULATOR BATTERY EXCHANGE;  Surgeon: Venita Lick, MD;  Location: [facility] OR;  Service: Orthopedics;  Laterality: N/A;  60 mins   SPINAL CORD STIMULATOR INSERTION N/A 11/21/2016   Procedure: LUMBAR SPINAL CORD STIMULATOR INSERTION;  Surgeon: Venita LickBrooks, Dahari, MD;  Location: [facility] OR;  Service: Orthopedics;  Laterality: N/A;  Requests 2.5 hrs   TONSILLECTOMY     1953   UPPER GI ENDOSCOPY  [id number]   VASECTOMY     1980    The following portions of the patient's history were reviewed and updated as appropriate: allergies, current medications, past family history, past medical history, past social history, past surgical history and problem list.  ROS Otherwise as in subjective above  Objective: BP 118/72   Pulse 72   Ht 5\' 7"  (1.702 m)   Wt 159 lb 12.8 oz (72.5 kg)   BMI 25.03 kg/m   General appearance: alert, no distress, well developed, well nourished Back nontender Gait is limited due to pain, using a cane He seems favoring the right leg Left leg is somewhat tender over the posterior thigh and calf and along the anterior tibia just below the knee but no obvious swelling or deformity of the knee or leg.  No discoloration of the leg.  Hip and knee range of motion seems relatively normal without significant laxity or pain He does seem to have pain in the leg from mid thigh to mid calf region when standing but not necessarily just sitting. Legs otherwise neurovascularly intact, strength seems within normal limits, DTRs blunted of lower legs    Assessment: Encounter Diagnoses  Name Primary?   Leg pain, left Yes   Spondylolisthesis of lumbar region    Lumbar disc disease    Chronic low back pain with sciatica, sciatica laterality unspecified, unspecified back pain laterality    Spinal  cord stimulator status    Muscle spasm      Plan: We discussed possible differential which could include radicular pain coming from lumbar spine issue given his known chronic back pain and disc issues, knee arthritis that is radiating from the knee, muscle strain and spasm.  No obvious signs of blood clot or infection or other.  I do not have access to recent imaging or orthopedic notes.  The last imaging we have on file of his lumbar spine was 2019 and he has had much more recent imaging through orthopedics.  We also do not have any prior left knee x-ray.  Advise he follow-up with orthopedics as he plans to do within the next week anyhow.  In the meantime can use tizanidine as needed for muscle spasm.  Discussed risk and benefits of safe use of this medication.    We discussed doing some alternating heat and cold therapy.  Advised stretching and relative rest.  We discussed 1 possible issue could be statin induced myalgia.  We discussed possibly cutting back on the statin for 3 to 5 days to see if that makes any difference.  I asked him to call back within a week to give us an update on symptoms.  Follow-up with orthopedics as planned soon.  [name] was seen today for leg pain.  Diagnoses and all orders for this visit:  Leg pain, left  Spondylolisthesis of lumbar region  Lumbar disc disease  Chronic low back pain with sciatica, sciatica laterality unspecified, unspecified back pain laterality  Spinal cord stimulator status  Muscle spasm  Other orders -     tiZANidine (ZANAFLEX) 4 MG tablet; Take 1 tablet (4 mg total) by mouth 2 (two) times daily as needed for  muscle spasms.    Follow up: 1 wk

## 2021-11-22 ENCOUNTER — Telehealth: Payer: Self-pay

## 2021-11-22 DIAGNOSIS — F909 Attention-deficit hyperactivity disorder, unspecified type: Secondary | ICD-10-CM

## 2021-11-22 MED ORDER — METHYLPHENIDATE HCL ER (OSM) 36 MG PO TBCR: 36.0000 mg | EXTENDED_RELEASE_TABLET | Freq: Every day | ORAL | 0 refills | Status: DC

## 2021-11-22 NOTE — Telephone Encounter (Signed)
Pt. Called needing a refill on his Concerta to [facility] on [address]. Last apt 11/14/21 and next apt 12/12/21.

## 2021-12-07 ENCOUNTER — Ambulatory Visit (INDEPENDENT_AMBULATORY_CARE_PROVIDER_SITE_OTHER): Payer: Medicare Other

## 2021-12-07 VITALS — BP 110/62 | HR 71 | Temp 97.5°F | Ht 66.5 in | Wt 162.8 lb

## 2021-12-07 DIAGNOSIS — Z0000 Encounter for general adult medical examination without abnormal findings: Secondary | ICD-10-CM

## 2021-12-07 NOTE — Patient Instructions (Signed)
[name] , Thank you for taking time to come for your Medicare Wellness Visit. I appreciate your ongoing commitment to your health goals. Please review the following plan we discussed and let me know if I can assist you in the future.   Screening recommendations/referrals: Colonoscopy: completed 01/26/2021 Recommended yearly ophthalmology/optometry visit for glaucoma screening and checkup Recommended yearly dental visit for hygiene and checkup  Vaccinations: Influenza vaccine: due 01/08/2022 Pneumococcal vaccine: completed 03/04/2014 Tdap vaccine: completed 02/26/2017, due 02/27/2027 Shingles vaccine: completed    Covid-19: 03/13/2021, 06/22/2020, 07/23/2019, 07/02/2019  Advanced directives: copy in chart  Conditions/risks identified: none  Next appointment: Follow up in one year for your annual wellness visit.   Preventive Care 50 Years and Older, Male Preventive care refers to lifestyle choices and visits with your health care provider that can promote health and wellness. What does preventive care include? A yearly physical exam. This is also called an annual well check. Dental exams once or twice a year. Routine eye exams. Ask your health care provider how often you should have your eyes checked. Personal lifestyle choices, including: Daily care of your teeth and gums. Regular physical activity. Eating a healthy diet. Avoiding tobacco and drug use. Limiting alcohol use. Practicing safe sex. Taking low doses of aspirin every day. Taking vitamin and mineral supplements as recommended by your health care provider. What happens during an annual well check? The services and screenings done by your health care provider during your annual well check will depend on your age, overall health, lifestyle risk factors, and family history of disease. Counseling  Your health care provider may ask you questions about your: Alcohol use. Tobacco use. Drug use. Emotional well-being. Home and  relationship well-being. Sexual activity. Eating habits. History of falls. Memory and ability to understand (cognition). Work and work Astronomer. Screening  You may have the following tests or measurements: Height, weight, and BMI. Blood pressure. Lipid and cholesterol levels. These may be checked every 5 years, or more frequently if you are over 78 years old. Skin check. Lung cancer screening. You may have this screening every year starting at age 36 if you have a 30-pack-year history of smoking and currently smoke or have quit within the past 15 years. Fecal occult blood test (FOBT) of the stool. You may have this test every year starting at age 44. Flexible sigmoidoscopy or colonoscopy. You may have a sigmoidoscopy every 5 years or a colonoscopy every 10 years starting at age 61. Prostate cancer screening. Recommendations will vary depending on your family history and other risks. Hepatitis C blood test. Hepatitis B blood test. Sexually transmitted disease (STD) testing. Diabetes screening. This is done by checking your blood sugar (glucose) after you have not eaten for a while (fasting). You may have this done every 1-3 years. Abdominal aortic aneurysm (AAA) screening. You may need this if you are a current or former smoker. Osteoporosis. You may be screened starting at age 53 if you are at high risk. Talk with your health care provider about your test results, treatment options, and if necessary, the need for more tests. Vaccines  Your health care provider may recommend certain vaccines, such as: Influenza vaccine. This is recommended every year. Tetanus, diphtheria, and acellular pertussis (Tdap, Td) vaccine. You may need a Td booster every 10 years. Zoster vaccine. You may need this after age 49. Pneumococcal 13-valent conjugate (PCV13) vaccine. One dose is recommended after age 54. Pneumococcal polysaccharide (PPSV23) vaccine. One dose is recommended after age 2.  Talk to your  health care provider about which screenings and vaccines you need and how often you need them. This information is not intended to replace advice given to you by your health care provider. Make sure you discuss any questions you have with your health care provider. Document Released: 06/23/2015 Document Revised: 02/14/2016 Document Reviewed: 03/28/2015 Elsevier Interactive Patient Education  2017 [employer] Prevention in the Home Falls can cause injuries. They can happen to people of all ages. There are many things you can do to make your home safe and to help prevent falls. What can I do on the outside of my home? Regularly fix the edges of walkways and driveways and fix any cracks. Remove anything that might make you trip as you walk through a door, such as a raised step or threshold. Trim any bushes or trees on the path to your home. Use bright outdoor lighting. Clear any walking paths of anything that might make someone trip, such as rocks or tools. Regularly check to see if handrails are loose or broken. Make sure that both sides of any steps have handrails. Any raised decks and porches should have guardrails on the edges. Have any leaves, snow, or ice cleared regularly. Use sand or salt on walking paths during winter. Clean up any spills in your garage right away. This includes oil or grease spills. What can I do in the bathroom? Use night lights. Install grab bars by the toilet and in the tub and shower. Do not use towel bars as grab bars. Use non-skid mats or decals in the tub or shower. If you need to sit down in the shower, use a plastic, non-slip stool. Keep the floor dry. Clean up any water that spills on the floor as soon as it happens. Remove soap buildup in the tub or shower regularly. Attach bath mats securely with double-sided non-slip rug tape. Do not have throw rugs and other things on the floor that can make you trip. What can I do in the bedroom? Use night  lights. Make sure that you have a light by your bed that is easy to reach. Do not use any sheets or blankets that are too big for your bed. They should not hang down onto the floor. Have a firm chair that has side arms. You can use this for support while you get dressed. Do not have throw rugs and other things on the floor that can make you trip. What can I do in the kitchen? Clean up any spills right away. Avoid walking on wet floors. Keep items that you use a lot in easy-to-reach places. If you need to reach something above you, use a strong step stool that has a grab bar. Keep electrical cords out of the way. Do not use floor polish or wax that makes floors slippery. If you must use wax, use non-skid floor wax. Do not have throw rugs and other things on the floor that can make you trip. What can I do with my stairs? Do not leave any items on the stairs. Make sure that there are handrails on both sides of the stairs and use them. Fix handrails that are broken or loose. Make sure that handrails are as long as the stairways. Check any carpeting to make sure that it is firmly attached to the stairs. Fix any carpet that is loose or worn. Avoid having throw rugs at the top or bottom of the stairs. If you do have throw  rugs, attach them to the floor with carpet tape. Make sure that you have a light switch at the top of the stairs and the bottom of the stairs. If you do not have them, ask someone to add them for you. What else can I do to help prevent falls? Wear shoes that: Do not have high heels. Have rubber bottoms. Are comfortable and fit you well. Are closed at the toe. Do not wear sandals. If you use a stepladder: Make sure that it is fully opened. Do not climb a closed stepladder. Make sure that both sides of the stepladder are locked into place. Ask someone to hold it for you, if possible. Clearly mark and make sure that you can see: Any grab bars or handrails. First and last  steps. Where the edge of each step is. Use tools that help you move around (mobility aids) if they are needed. These include: Canes. Walkers. Scooters. Crutches. Turn on the lights when you go into a dark area. Replace any light bulbs as soon as they burn out. Set up your furniture so you have a clear path. Avoid moving your furniture around. If any of your floors are uneven, fix them. If there are any pets around you, be aware of where they are. Review your medicines with your doctor. Some medicines can make you feel dizzy. This can increase your chance of falling. Ask your doctor what other things that you can do to help prevent falls. This information is not intended to replace advice given to you by your health care provider. Make sure you discuss any questions you have with your health care provider. Document Released: 03/23/2009 Document Revised: 11/02/2015 Document Reviewed: 07/01/2014 Elsevier Interactive Patient Education  2017 [employer].

## 2021-12-07 NOTE — Progress Notes (Signed)
Subjective:   Reed PandyMitchell W Imm is a 76 y.o. male who presents for Medicare Annual/Subsequent preventive examination.  Review of Systems     Cardiac Risk Factors include: advanced age (>1355men, 55>65 women);dyslipidemia;hypertension;male gender     Objective:    Today's Vitals   12/07/21 1115 12/07/21 1121  BP: 110/62   Pulse: 71   Temp: (!) 97.5 F (36.4 C)   TempSrc: Oral   SpO2: 97%   Weight: 162 lb 12.8 oz (73.8 kg)   Height: 5' 6.5" (1.689 m)   PainSc:  2    Body mass index is 25.88 kg/m.     12/07/2021   11:34 AM 10/03/2021   10:38 AM 12/04/2020    1:59 PM 08/17/2019    9:40 AM 04/23/2018   10:50 AM 11/13/2016   10:28 AM 10/15/2016   10:33 AM  Advanced Directives  Does Patient Have a Medical Advance Directive? Yes Yes Yes Yes Yes Yes Yes  Type of Estate agentAdvance Directive Healthcare Power of [place];Living will  Living will Healthcare Power of [place];Living will Healthcare Power of [place];Living will Healthcare Power of [place];Living will   Does patient want to make changes to medical advance directive?  No - Patient declined  No - Patient declined No - Patient declined No - Patient declined   Copy of Healthcare Power of Attorney in Chart? Yes - validated most recent copy scanned in chart (See row information)   No - copy requested Yes - validated most recent copy scanned in chart (See row information) No - copy requested     Current Medications (verified) Outpatient Encounter Medications as of 12/07/2021  Medication Sig   atorvastatin (LIPITOR) 80 MG tablet TAKE 1 TABLET BY MOUTH  DAILY   azelastine (ASTELIN) 0.1 % nasal spray Place 2 sprays into both nostrils 2 (two) times daily. Use in each nostril as directed   buPROPion (WELLBUTRIN XL) 300 MG 24 hr tablet TAKE 1 TABLET BY MOUTH  DAILY   calcium carbonate (OS-CAL) 600 MG TABS tablet Take 600 mg by mouth daily with breakfast.   cetirizine (ZYRTEC) 10 MG tablet Take 10 mg by mouth daily.   clopidogrel (PLAVIX) 75 MG  tablet TAKE 1 TABLET BY MOUTH  DAILY   desvenlafaxine (PRISTIQ) 50 MG 24 hr tablet TAKE 1 TABLET BY MOUTH  DAILY   diltiazem (CARDIZEM CD) 240 MG 24 hr capsule TAKE 1 CAPSULE BY MOUTH  DAILY   GLUCOSAMINE CHONDROITIN COMPLX PO Take 1 tablet by mouth daily.   guaiFENesin (MUCINEX) 600 MG 12 hr tablet Take 600 mg by mouth 2 (two) times daily as needed for cough.   meclizine (ANTIVERT) 25 MG tablet 1 twice a day as needed for dizziness   methylphenidate (CONCERTA) 36 MG PO CR tablet Take 1 tablet (36 mg total) by mouth daily.   montelukast (SINGULAIR) 10 MG tablet Take 1 tablet (10 mg total) by mouth at bedtime.   Multiple Vitamins-Minerals (MULTIVITAMIN WITH MINERALS) tablet Take 1 tablet by mouth daily.   tiZANidine (ZANAFLEX) 4 MG tablet Take 1 tablet (4 mg total) by mouth 2 (two) times daily as needed for muscle spasms.   ALPRAZolam (XANAX) 0.25 MG tablet TAKE 1 TABLET BY MOUTH TWICE A DAY AS NEEDED (Patient not taking: Reported on 12/07/2021)   ferrous sulfate 325 (65 FE) MG tablet Take 325 mg by mouth once a week. (Patient not taking: Reported on 11/14/2021)   Flaxseed, Linseed, (FLAX SEED OIL) 1000 MG CAPS Take 1,000 mg by mouth every  7 (seven) days. (Patient not taking: Reported on 11/14/2021)   pantoprazole (PROTONIX) 40 MG tablet Take 1 tablet (40 mg total) by mouth daily. (Patient not taking: Reported on 11/14/2021)   PROAIR HFA 108 (90 Base) MCG/ACT inhaler TAKE 2 PUFFS BY MOUTH EVERY 4 HOURS AS NEEDED (Patient not taking: Reported on 12/07/2021)   sucralfate (CARAFATE) 1 GM/10ML suspension Take 10 mLs (1 g total) by mouth daily. (Patient not taking: Reported on 12/07/2021)   umeclidinium-vilanterol (ANORO ELLIPTA) 62.5-25 MCG/INH AEPB INHALE 1 PUFF BY MOUTH EVERY DAY (Patient not taking: Reported on 11/14/2021)   No facility-administered encounter medications on file as of 12/07/2021.    Allergies (verified) Patient has no active allergies.   History: Past Medical History:  Diagnosis Date    ADD (attention deficit disorder)    Allergic rhinitis    skin test 02/23/08 vaccine 2009; allergy shots weekly through Dr. Maple Hudson   Anemia    takes Fe -    Anginal pain (HCC)    Ankylosing spondylitis (HCC)    Anxiety    sees therapist Vernell Leep   Arthritis    Asthma    Dr. 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 Donnie Aho   CHEILECTOMY  10/16/2011   Procedure: CHEILECTOMY;  Surgeon: Sherri Rad, MD;  Location: [facility];  Service: Orthopedics;  Laterality: Right;   CHOLECYSTECTOMY     COLONOSCOPY     CORONARY ANGIOPLASTY     1995 after MI   CORONARY ANGIOPLASTY WITH STENT PLACEMENT     1999    ESOPHAGOGASTRODUODENOSCOPY  02/18/2012   Procedure: ESOPHAGOGASTRODUODENOSCOPY (EGD);  Surgeon: Florencia Reasons, MD;  Location: [facility];  Service: Endoscopy;  Laterality: N/A;   FOOT SURGERY     joint scraped, arthritis right foot   HERNIA REPAIR     LUMBAR DISC SURGERY     x2   right index finger  mass removed- arthritis     Dr. Amanda Pea   [facility]  Right 05/18/2021    medial toe consistent with callus   SKIN BIOPSY Left 05/17/2021   actinic keratosis   SPINAL CORD STIMULATOR BATTERY EXCHANGE N/A 10/11/2021   Procedure: SPINAL CORD STIMULATOR BATTERY EXCHANGE;  Surgeon: Venita Lick, MD;  Location: [facility] OR;  Service: Orthopedics;  Laterality: N/A;  60 mins   SPINAL CORD STIMULATOR INSERTION N/A 11/21/2016   Procedure: LUMBAR SPINAL CORD STIMULATOR INSERTION;  Surgeon: Venita Lick,  MD;  Location: [facility] OR;  Service: Orthopedics;  Laterality: N/A;  Requests 2.5 hrs   TONSILLECTOMY     1953   UPPER GI ENDOSCOPY  [id number]   VASECTOMY     1980   Family History  Problem Relation Age of Onset   Heart disease Father    Dementia Father    Diabetes Mother    Dementia Mother    Heart disease Mother    Chronic fatigue Daughter    Heart disease Sister        stent ,CAD   Arthritis Sister    Cancer Neg Hx    Social History   Socioeconomic History   Marital status: Married    Spouse name: Not on file   Number of children: Y   Years of education: Not on file   Highest education level: Not on file  Occupational History   Occupation: choir singer   Occupation: retired- Firefighter employees  Tobacco Use   Smoking status: Never   Smokeless tobacco: Never  Building services engineer Use: Never used  Substance and Sexual Activity   Alcohol use: Yes    Comment: occasional   Drug use: No   Sexual activity: Not Currently  Other Topics Concern   Not on file  Social History Narrative   Married, has 2 children, 38yo son, 35yo daughter with fibromyalgia, chronic fatigue syndrome, former Metallurgist.   Artist (paints)   Social Determinants of Health   Financial Resource Strain: Low Risk  (12/07/2021)   Overall Financial Resource Strain (CARDIA)    Difficulty of Paying Living Expenses: Not hard at all  Food Insecurity: No Food Insecurity (12/07/2021)   Hunger Vital Sign    Worried About Running Out of Food in the Last Year: Never true     Ran Out of Food in the Last Year: Never true  Transportation Needs: No Transportation Needs (12/07/2021)   PRAPARE - Administrator, Civil Service (Medical): No    Lack of Transportation (Non-Medical): No  Physical Activity: Inactive (12/07/2021)   Exercise Vital Sign    Days of Exercise per Week: 0 days    Minutes of Exercise per Session: 0 min  Stress: No Stress Concern Present (12/07/2021)   [employer] of Occupational Health - Occupational Stress Questionnaire    Feeling of Stress : Not at all  Social Connections: Not on file    Tobacco Counseling Counseling given: Not Answered   Clinical Intake:  Pre-visit preparation completed: Yes  Pain : 0-10 Pain Score: 2  Pain Type: Acute pain Pain Location: Knee Pain Orientation: Left Pain Descriptors / Indicators: Sharp, Aching Pain Onset: 1 to 4 weeks ago Pain Frequency: Intermittent     Nutritional Status: BMI 25 -29 Overweight Nutritional Risks: None Diabetes: No  How often do you need to have someone help you when you read instructions, pamphlets, or other written materials from your doctor or pharmacy?: 1 - Never What is the last grade level you completed in school?: masters degree  Diabetic? no  Interpreter Needed?: No  Information entered by :: NAllen LPN   Activities of Daily Living    12/07/2021   11:38 AM 10/03/2021   10:44 AM  In your present state of health, do you have any difficulty performing the following activities:  Hearing? 0   Comment has hearing aids   Vision? 0   Difficulty concentrating or making decisions? 1   Walking or climbing stairs?  0   Dressing or bathing? 0   Doing errands, shopping? 0 0  Preparing Food and eating ? N   Using the Toilet? N   In the past six months, have you accidently leaked urine? N   Do you have problems with loss of bowel control? N   Managing your Medications? N   Managing your Finances? N   Housekeeping or managing your Housekeeping? N      Patient Care Team: Ronnald Nian, MD as PCP - General (Family Medicine) Dulce Sellar Iline Oven, MD as PCP - Cardiology (Cardiology) Othella Boyer, MD as Consulting Physician (Cardiology) Waymon Budge, MD as Consulting Physician (Pulmonary Disease)  Indicate any recent Medical Services you may have received from other than [facility] providers in the past year (date may be approximate).     Assessment:   This is a routine wellness examination for [place].  Hearing/Vision screen Vision Screening - Comments:: Regular eye exams, [facility]  Dietary issues and exercise activities discussed: Current Exercise Habits: The patient does not participate in regular exercise at present   Goals Addressed             This Visit's Progress    Patient Stated       12/07/2021, no goals       Depression Screen    12/07/2021   11:37 AM 12/04/2020    2:01 PM 08/17/2019    9:25 AM 04/23/2018   10:27 AM 02/02/2018    2:52 PM 10/15/2016   10:05 AM 12/13/2015   10:04 AM  PHQ 2/9 Scores  PHQ - 2 Score 0 0 0 0 0 0 0  PHQ- 9 Score 1 1         Fall Risk    12/07/2021   11:36 AM 12/04/2020    2:00 PM 08/17/2019    9:24 AM 04/23/2018   10:27 AM 02/02/2018    2:52 PM  Fall Risk   Falls in the past year? 1 1 1  0 No  Comment rushing and not paying attention      Number falls in past yr: 1 1 1     Injury with Fall? 0 0 0    Risk for fall due to : History of fall(s);Medication side effect;Impaired mobility;Impaired balance/gait Impaired balance/gait;Impaired mobility     Follow up  Falls evaluation completed       FALL RISK PREVENTION PERTAINING TO THE HOME:  Any stairs in or around the home? Yes  If so, are there any without handrails? No  Home free of loose throw rugs in walkways, pet beds, electrical cords, etc? Yes  Adequate lighting in your home to reduce risk of falls? Yes   ASSISTIVE DEVICES UTILIZED TO PREVENT FALLS:  Life alert? No  Use of a cane, walker or w/c? No  Grab  bars in the bathroom? Yes  Shower chair or bench in shower? No  Elevated toilet seat or a handicapped toilet? Yes   TIMED UP AND GO:  Was the test performed? No .    Gait slow and steady without use of assistive device  Cognitive Function:        12/07/2021   11:41 AM  6CIT Screen  What Year? 0 points  What month? 0 points  What time? 0 points  Count back from 20 0 points  Months in reverse 0 points  Repeat phrase 0 points  Total Score 0 points    Immunizations Immunization History  Administered Date(s) Administered  Fluad Quad(high Dose 65+) 04/14/2020, 03/13/2021   Influenza Split 04/23/2011   Influenza Whole 03/20/2009, 03/19/2010   Influenza, High Dose Seasonal PF 04/12/2016, 02/26/2017, 04/02/2018, 01/30/2019, 01/30/2019   Influenza,inj,Quad PF,6+ Mos 03/05/2013, 02/23/2014, 04/21/2015   Influenza-Unspecified 04/02/2018, 01/30/2019   PFIZER Comirnaty(Gray Top)Covid-19 Tri-Sucrose Vaccine 06/22/2020   PFIZER(Purple Top)SARS-COV-2 Vaccination 07/02/2019, 07/23/2019   Pfizer Covid-19 Vaccine Bivalent Booster 46yrs & up 03/13/2021   Pneumococcal Conjugate-13 03/04/2014   Pneumococcal Polysaccharide-23 06/10/2005, 05/19/2012   Td 10/20/2006   Tdap 10/20/2006, 02/26/2017   Zoster Recombinat (Shingrix) 12/10/2016, 07/15/2017   Zoster, Live 01/01/2007    TDAP status: Up to date  Flu Vaccine status: Up to date  Pneumococcal vaccine status: Up to date  Covid-19 vaccine status: Completed vaccines  Qualifies for Shingles Vaccine? Yes   Zostavax completed Yes   Shingrix Completed?: Yes  Screening Tests Health Maintenance  Topic Date Due   COVID-19 Vaccine (5 - Pfizer series) 07/14/2021   INFLUENZA VACCINE  01/08/2022   TETANUS/TDAP  02/27/2027   COLONOSCOPY (Pts 45-59yrs Insurance coverage will need to be confirmed)  01/27/2031   Pneumonia Vaccine 16+ Years old  Completed   Hepatitis C Screening  Completed   Zoster Vaccines- Shingrix  Completed   HPV  VACCINES  Aged Out    Health Maintenance  Health Maintenance Due  Topic Date Due   COVID-19 Vaccine (5 - Pfizer series) 07/14/2021    Colorectal cancer screening: No longer required.   Lung Cancer Screening: (Low Dose CT Chest recommended if Age 58-80 years, 30 pack-year currently smoking OR have quit w/in 15years.) does not qualify.   Lung Cancer Screening Referral: no  Additional Screening:  Hepatitis C Screening: does qualify; Completed 10/10/2015  Vision Screening: Recommended annual ophthalmology exams for early detection of glaucoma and other disorders of the eye. Is the patient up to date with their annual eye exam?  Yes  Who is the provider or what is the name of the office in which the patient attends annual eye exams? [facility] If pt is not established with a provider, would they like to be referred to a provider to establish care? No .   Dental Screening: Recommended annual dental exams for proper oral hygiene  Community Resource Referral / Chronic Care Management: CRR required this visit?  No   CCM required this visit?  No      Plan:     I have personally reviewed and noted the following in the patient's chart:   Medical and social history Use of alcohol, tobacco or illicit drugs  Current medications and supplements including opioid prescriptions. Patient is not currently taking opioid prescriptions. Functional ability and status Nutritional status Physical activity Advanced directives List of other physicians Hospitalizations, surgeries, and ER visits in previous 12 months Vitals Screenings to include cognitive, depression, and falls Referrals and appointments  In addition, I have reviewed and discussed with patient certain preventive protocols, quality metrics, and best practice recommendations. A written personalized care plan for preventive services as well as general preventive health recommendations were provided to patient.      Barb Merino, LPN   4/48/1856   Nurse Notes: none

## 2021-12-12 ENCOUNTER — Ambulatory Visit: Payer: Medicare Other | Admitting: Family Medicine

## 2021-12-12 NOTE — Progress Notes (Deleted)
Complete physical exam  Patient: [name]   DOB: May 29, 1946   76 y.o. Male  MRN: [id number]  Subjective:    No chief complaint on file.   [name] is a 76 y.o. male who presents today for a complete physical exam. He reports consuming a {diet types:17450} diet. {types:19826} He generally feels {DESC; WELL/FAIRLY WELL/POORLY:18703}. He reports sleeping {DESC; WELL/FAIRLY WELL/POORLY:18703}. He {does/does not:200015} have additional problems to discuss today.    Most recent fall risk assessment:    12/07/2021   11:36 AM  Fall Risk   Falls in the past year? 1  Comment rushing and not paying attention  Number falls in past yr: 1  Injury with Fall? 0  Risk for fall due to : History of fall(s);Medication side effect;Impaired mobility;Impaired balance/gait     Most recent depression screenings:    12/07/2021   11:37 AM 12/04/2020    2:01 PM  PHQ 2/9 Scores  PHQ - 2 Score 0 0  PHQ- 9 Score 1 1    {VISON DENTAL STD PSA (Optional):27386}  {History (Optional):23778}  Patient Care Team: Ronnald Nian, MD as PCP - General (Family Medicine) Dulce Sellar Iline Oven, MD as PCP - Cardiology (Cardiology) Othella Boyer, MD as Consulting Physician (Cardiology) Waymon Budge, MD as Consulting Physician (Pulmonary Disease)   Outpatient Medications Prior to Visit  Medication Sig   ALPRAZolam (XANAX) 0.25 MG tablet TAKE 1 TABLET BY MOUTH TWICE A DAY AS NEEDED (Patient not taking: Reported on 12/07/2021)   atorvastatin (LIPITOR) 80 MG tablet TAKE 1 TABLET BY MOUTH  DAILY   azelastine (ASTELIN) 0.1 % nasal spray Place 2 sprays into both nostrils 2 (two) times daily. Use in each nostril as directed   buPROPion (WELLBUTRIN XL) 300 MG 24 hr tablet TAKE 1 TABLET BY MOUTH  DAILY   calcium carbonate (OS-CAL) 600 MG TABS tablet Take 600 mg by mouth daily with breakfast.   cetirizine (ZYRTEC) 10 MG tablet Take 10 mg by mouth daily.   clopidogrel (PLAVIX) 75 MG tablet TAKE 1  TABLET BY MOUTH  DAILY   desvenlafaxine (PRISTIQ) 50 MG 24 hr tablet TAKE 1 TABLET BY MOUTH  DAILY   diltiazem (CARDIZEM CD) 240 MG 24 hr capsule TAKE 1 CAPSULE BY MOUTH  DAILY   ferrous sulfate 325 (65 FE) MG tablet Take 325 mg by mouth once a week. (Patient not taking: Reported on 11/14/2021)   Flaxseed, Linseed, (FLAX SEED OIL) 1000 MG CAPS Take 1,000 mg by mouth every 7 (seven) days. (Patient not taking: Reported on 11/14/2021)   GLUCOSAMINE CHONDROITIN COMPLX PO Take 1 tablet by mouth daily.   guaiFENesin (MUCINEX) 600 MG 12 hr tablet Take 600 mg by mouth 2 (two) times daily as needed for cough.   meclizine (ANTIVERT) 25 MG tablet 1 twice a day as needed for dizziness   methylphenidate (CONCERTA) 36 MG PO CR tablet Take 1 tablet (36 mg total) by mouth daily.   montelukast (SINGULAIR) 10 MG tablet Take 1 tablet (10 mg total) by mouth at bedtime.   Multiple Vitamins-Minerals (MULTIVITAMIN WITH MINERALS) tablet Take 1 tablet by mouth daily.   pantoprazole (PROTONIX) 40 MG tablet Take 1 tablet (40 mg total) by mouth daily. (Patient not taking: Reported on 11/14/2021)   PROAIR HFA 108 (90 Base) MCG/ACT inhaler TAKE 2 PUFFS BY MOUTH EVERY 4 HOURS AS NEEDED (Patient not taking: Reported on 12/07/2021)   sucralfate (CARAFATE) 1 GM/10ML suspension Take 10 mLs (1 g total)  by mouth daily. (Patient not taking: Reported on 12/07/2021)   tiZANidine (ZANAFLEX) 4 MG tablet Take 1 tablet (4 mg total) by mouth 2 (two) times daily as needed for muscle spasms.   umeclidinium-vilanterol (ANORO ELLIPTA) 62.5-25 MCG/INH AEPB INHALE 1 PUFF BY MOUTH EVERY DAY (Patient not taking: Reported on 11/14/2021)   No facility-administered medications prior to visit.    ROS        Objective:     There were no vitals taken for this visit. {Vitals History (Optional):23777}  Physical Exam   No results found for any visits on 12/12/21. {Show previous labs (optional):23779}    Assessment & Plan:    Routine Health  Maintenance and Physical Exam  Immunization History  Administered Date(s) Administered   Fluad Quad(high Dose 65+) 04/14/2020, 03/13/2021   Influenza Split 04/23/2011   Influenza Whole 03/20/2009, 03/19/2010   Influenza, High Dose Seasonal PF 04/12/2016, 02/26/2017, 04/02/2018, 01/30/2019, 01/30/2019   Influenza,inj,Quad PF,6+ Mos 03/05/2013, 02/23/2014, 04/21/2015   Influenza-Unspecified 04/02/2018, 01/30/2019   PFIZER Comirnaty(Gray Top)Covid-19 Tri-Sucrose Vaccine 06/22/2020   PFIZER(Purple Top)SARS-COV-2 Vaccination 07/02/2019, 07/23/2019   Pfizer Covid-19 Vaccine Bivalent Booster 4yrs & up 03/13/2021   Pneumococcal Conjugate-13 03/04/2014   Pneumococcal Polysaccharide-23 06/10/2005, 05/19/2012   Td 10/20/2006   Tdap 10/20/2006, 02/26/2017   Zoster Recombinat (Shingrix) 12/10/2016, 07/15/2017   Zoster, Live 01/01/2007    Health Maintenance  Topic Date Due   COVID-19 Vaccine (5 - Pfizer series) 07/14/2021   INFLUENZA VACCINE  01/08/2022   TETANUS/TDAP  02/27/2027   COLONOSCOPY (Pts 45-32yrs Insurance coverage will need to be confirmed)  01/27/2031   Pneumonia Vaccine 9+ Years old  Completed   Hepatitis C Screening  Completed   Zoster Vaccines- Shingrix  Completed   HPV VACCINES  Aged Out    Discussed health benefits of physical activity, and encouraged him to engage in regular exercise appropriate for his age and condition.  Problem List Items Addressed This Visit   None  No follow-ups on file.     Renelda Loma, RMA

## 2021-12-24 ENCOUNTER — Encounter: Payer: Self-pay | Admitting: Family Medicine

## 2021-12-24 ENCOUNTER — Ambulatory Visit
Admission: RE | Admit: 2021-12-24 | Discharge: 2021-12-24 | Disposition: A | Discharge status: Home / Self Care | Payer: Medicare Other | Source: Ambulatory Visit | Attending: Family Medicine | Admitting: Family Medicine

## 2021-12-24 ENCOUNTER — Ambulatory Visit (INDEPENDENT_AMBULATORY_CARE_PROVIDER_SITE_OTHER): Payer: Medicare Other | Admitting: Family Medicine

## 2021-12-24 VITALS — BP 102/66 | HR 83 | Temp 97.7°F | Ht 64.5 in | Wt 158.6 lb

## 2021-12-24 DIAGNOSIS — M25562 Pain in left knee: Secondary | ICD-10-CM

## 2021-12-24 DIAGNOSIS — N529 Male erectile dysfunction, unspecified: Secondary | ICD-10-CM

## 2021-12-24 DIAGNOSIS — Z9689 Presence of other specified functional implants: Secondary | ICD-10-CM

## 2021-12-24 DIAGNOSIS — D649 Anemia, unspecified: Secondary | ICD-10-CM

## 2021-12-24 DIAGNOSIS — F418 Other specified anxiety disorders: Secondary | ICD-10-CM

## 2021-12-24 DIAGNOSIS — G8929 Other chronic pain: Secondary | ICD-10-CM | POA: Diagnosis not present

## 2021-12-24 DIAGNOSIS — M519 Unspecified thoracic, thoracolumbar and lumbosacral intervertebral disc disorder: Secondary | ICD-10-CM | POA: Diagnosis not present

## 2021-12-24 DIAGNOSIS — E782 Mixed hyperlipidemia: Secondary | ICD-10-CM

## 2021-12-24 DIAGNOSIS — K219 Gastro-esophageal reflux disease without esophagitis: Secondary | ICD-10-CM

## 2021-12-24 DIAGNOSIS — R42 Dizziness and giddiness: Secondary | ICD-10-CM

## 2021-12-24 DIAGNOSIS — M1990 Unspecified osteoarthritis, unspecified site: Secondary | ICD-10-CM

## 2021-12-24 DIAGNOSIS — F909 Attention-deficit hyperactivity disorder, unspecified type: Secondary | ICD-10-CM

## 2021-12-24 DIAGNOSIS — I25118 Atherosclerotic heart disease of native coronary artery with other forms of angina pectoris: Secondary | ICD-10-CM

## 2021-12-24 HISTORY — DX: Dizziness and giddiness: R42

## 2021-12-24 LAB — CBC WITH DIFFERENTIAL/PLATELET
Basophils Absolute: 0.1 10*3/uL (ref 0.0–0.2)
Basos: 1 %
EOS (ABSOLUTE): 0.1 10*3/uL (ref 0.0–0.4)
Eos: 2 %
Hematocrit: 38.3 % (ref 37.5–51.0)
Hemoglobin: 12.3 g/dL — ABNORMAL LOW (ref 13.0–17.7)
Immature Grans (Abs): 0 10*3/uL (ref 0.0–0.1)
Immature Granulocytes: 0 %
Lymphocytes Absolute: 1.8 10*3/uL (ref 0.7–3.1)
Lymphs: 28 %
MCH: 27.3 pg (ref 26.6–33.0)
MCHC: 32.1 g/dL (ref 31.5–35.7)
MCV: 85 fL (ref 79–97)
Monocytes Absolute: 0.5 10*3/uL (ref 0.1–0.9)
Monocytes: 8 %
Neutrophils Absolute: 4 10*3/uL (ref 1.4–7.0)
Neutrophils: 61 %
Platelets: 250 10*3/uL (ref 150–450)
RBC: 4.5 x10E6/uL (ref 4.14–5.80)
RDW: 17 % — ABNORMAL HIGH (ref 11.6–15.4)
WBC: 6.5 10*3/uL (ref 3.4–10.8)

## 2021-12-24 MED ORDER — METHYLPHENIDATE HCL ER (OSM) 54 MG PO TBCR: 54.0000 mg | EXTENDED_RELEASE_TABLET | ORAL | 0 refills | Status: DC

## 2021-12-24 MED ORDER — ALPRAZOLAM 0.25 MG PO TABS: 0.2500 mg | ORAL_TABLET | Freq: Two times a day (BID) | ORAL | 1 refills | Status: DC | PRN

## 2021-12-24 NOTE — Progress Notes (Deleted)
[name] is a 76 y.o. male who presents for annual wellness visit and follow-up on chronic medical conditions.  He has the following concerns:   Immunizations and Health Maintenance Immunization History  Administered Date(s) Administered   Fluad Quad(high Dose 65+) 04/14/2020, 03/13/2021   Influenza Split 04/23/2011   Influenza Whole 03/20/2009, 03/19/2010   Influenza, High Dose Seasonal PF 04/12/2016, 02/26/2017, 04/02/2018, 01/30/2019, 01/30/2019   Influenza,inj,Quad PF,6+ Mos 03/05/2013, 02/23/2014, 04/21/2015   Influenza-Unspecified 04/02/2018, 01/30/2019   PFIZER Comirnaty(Gray Top)Covid-19 Tri-Sucrose Vaccine 06/22/2020   PFIZER(Purple Top)SARS-COV-2 Vaccination 07/02/2019, 07/23/2019   Pfizer Covid-19 Vaccine Bivalent Booster 61yrs & up 03/13/2021   Pneumococcal Conjugate-13 03/04/2014   Pneumococcal Polysaccharide-23 06/10/2005, 05/19/2012   Td 10/20/2006   Tdap 10/20/2006, 02/26/2017   Zoster Recombinat (Shingrix) 12/10/2016, 07/15/2017   Zoster, Live 01/01/2007   Health Maintenance Due  Topic Date Due   COVID-19 Vaccine (5 - Pfizer series) 07/14/2021    Last colonoscopy: Last PSA: Dentist: Ophtho: Exercise:  Other doctors caring for patient include:  Advanced Directives:    Depression screen:  See questionnaire below.        12/07/2021   11:37 AM 12/04/2020    2:01 PM 08/17/2019    9:25 AM 04/23/2018   10:27 AM 02/02/2018    2:52 PM  Depression screen PHQ 2/9  Decreased Interest 0 0 0 0 0  Down, Depressed, Hopeless 0 0 0 0 0  PHQ - 2 Score 0 0 0 0 0  Altered sleeping 0 0     Tired, decreased energy 1 1     Change in appetite 0 0     Feeling bad or failure about yourself  0 0     Trouble concentrating 0 0     Moving slowly or fidgety/restless 0 0     Suicidal thoughts 0 0     PHQ-9 Score 1 1     Difficult doing work/chores Not difficult at all Not difficult at all       Fall Screen: See Questionaire below.      12/07/2021   11:36 AM  12/04/2020    2:00 PM 08/17/2019    9:24 AM 04/23/2018   10:27 AM 02/02/2018    2:52 PM  Fall Risk   Falls in the past year? 1 1 1  0 No  Comment rushing and not paying attention      Number falls in past yr: 1 1 1     Injury with Fall? 0 0 0    Risk for fall due to : History of fall(s);Medication side effect;Impaired mobility;Impaired balance/gait Impaired balance/gait;Impaired mobility     Follow up  Falls evaluation completed       ADL screen:  See questionnaire below.  Functional Status Survey:     Review of Systems  Constitutional: -, -unexpected weight change, -anorexia, -fatigue Allergy: -sneezing, -itching, -congestion Dermatology: denies changing moles, rash, lumps ENT: -runny nose, -ear pain, -sore throat,  Cardiology:  -chest pain, -palpitations, -orthopnea, Respiratory: -cough, -shortness of breath, -dyspnea on exertion, -wheezing,  Gastroenterology: -abdominal pain, -nausea, -vomiting, -diarrhea, -constipation, -dysphagia Hematology: -bleeding or bruising problems Musculoskeletal: -arthralgias, -myalgias, -joint swelling, -back pain, - Ophthalmology: -vision changes,  Urology: -dysuria, -difficulty urinating,  -urinary frequency, -urgency, incontinence Neurology: -, -numbness, , -memory loss, -falls, -dizziness    PHYSICAL EXAM:  There were no vitals taken for this visit.  General Appearance: Alert, cooperative, no distress, appears stated age Head: Normocephalic, without obvious abnormality, atraumatic Eyes: PERRL, conjunctiva/corneas clear, EOM's intact,  fundi benign Ears: Normal TM's and external ear canals Nose: Nares normal, mucosa normal, no drainage or sinus   tenderness Throat: Lips, mucosa, and tongue normal; teeth and gums normal Neck: Supple, no lymphadenopathy, thyroid:no enlargement/tenderness/nodules; no carotid bruit or JVD Lungs: Clear to auscultation bilaterally without wheezes, rales or ronchi; respirations unlabored Heart: Regular rate and  rhythm, S1 and S2 normal, no murmur, rub or gallop Abdomen: Soft, non-tender, nondistended, normoactive bowel sounds, no masses, no hepatosplenomegaly Extremities: No clubbing, cyanosis or edema Pulses: 2+ and symmetric all extremities Skin: Skin color, texture, turgor normal, no rashes or lesions Lymph nodes: Cervical, supraclavicular, and axillary nodes normal Neurologic: CNII-XII intact, normal strength, sensation and gait; reflexes 2+ and symmetric throughout   Psych: Normal mood, affect, hygiene and grooming  ASSESSMENT/PLAN: No diagnosis found.    Discussed PSA screening (risks/benefits), recommended at least 30 minutes of aerobic activity at least 5 days/week; proper sunscreen use reviewed; healthy diet and alcohol recommendations (less than or equal to 2 drinks/day) reviewed; regular seatbelt use; changing batteries in smoke detectors. Immunization recommendations discussed.  Colonoscopy recommendations reviewed.   Medicare Attestation I have personally reviewed: The patient's medical and social history Their use of alcohol, tobacco or illicit drugs Their current medications and supplements The patient's functional ability including ADLs,fall risks, home safety risks, cognitive, and hearing and visual impairment Diet and physical activities Evidence for depression or mood disorders  The patient's weight, height, and BMI have been recorded in the chart.  I have made referrals, counseling, and provided education to the patient based on review of the above and I have provided the patient with a written personalized care plan for preventive services.     Sharlot Gowda, MD   12/24/2021

## 2021-12-24 NOTE — Progress Notes (Signed)
Complete physical exam  Patient: [name]   DOB: 02/05/1946   76 y.o. Male  MRN: [id number]  Subjective:    Chief Complaint  Patient presents with   Annual Exam    Fasting     ANGELDEJESUS Patterson is a 76 y.o. male who presents today for a complete physical exam. He reports consuming a general diet. Gym/ health club routine includes stationary bike. He generally feels fairly well. He reports sleeping well. He does have additional problems to discuss today.  He complains of left knee pain especially with walking.  No popping, locking or grinding.  He has not really tried any medications for this and has not had any x-rays.  He also has a history of low back pain and does have a stimulator in place.  He does complain of walking with a shuffling gait mainly because of the pain.  He states he has a fear of falling because of the back pain.  He does also have right foot pain but has seen podiatry and the recommendation was to get shoes that are wider in the toe to help with the pain.  He does have underlying erectile dysfunction but Viagra and Cialis have not been very useful.  He thinks his ADD is not under good control and would like to go to a higher level.  He also tried to give blood recently and they denied him stating that his hemoglobin was too low.  Psychologically he seems to be fairly stable.  He would like a refill on Xanax but uses this very sparingly.  He follows up regularly with cardiology.  No chest pain, shortness of breath, PND or DOE.  He does have reflux disease and presently is using pantoprazole as well as sucralfate.  He notes that certain foods do cause more distress.   Most recent fall risk assessment:    12/07/2021   11:36 AM  Fall Risk   Falls in the past year? 1  Comment rushing and not paying attention  Number falls in past yr: 1  Injury with Fall? 0  Risk for fall due to : History of fall(s);Medication side effect;Impaired mobility;Impaired balance/gait      Most recent depression screenings:    12/07/2021   11:37 AM 12/04/2020    2:01 PM  PHQ 2/9 Scores  PHQ - 2 Score 0 0  PHQ- 9 Score 1 1      Patient Active Problem List   Diagnosis Date Noted   Dizziness 12/24/2021   Leg pain, left 11/14/2021   Chronic low back pain with sciatica 11/14/2021   Spinal cord stimulator status 11/14/2021   Muscle spasm 11/14/2021   Constipation 09/11/2021   Slow transit constipation 09/11/2021   Cough 09/11/2021   Early satiety 09/11/2021   History of malignant neoplasm of colon 09/11/2021   Hoarseness 09/11/2021   Iron deficiency 09/11/2021   Personal history of colonic polyps 09/11/2021   Weight decreased 09/11/2021   Esophageal motility disorder 09/11/2021   Gastritis 09/11/2021   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   Pain in both feet 08/10/2021   Diverticulitis 02/26/2021   Atherosclerosis of aorta (HCC) 08/30/2020   Mild concentric left ventricular hypertrophy (LVH)    Hypogonadism male    History of kidney stones    Erectile dysfunction    COPD (chronic obstructive pulmonary disease) ([place])    Acquired trigger finger of left middle finger 04/22/2019   Acquired trigger finger of right middle finger 04/22/2019   Left ventricular  dysfunction 10/12/2018   Ankylosing spondylitis ([place]) 10/09/2018   Presbycusis of both ears 04/23/2018   Mild intermittent asthma without complication 85/07/7739   Headache 01/30/2017   Chronic pain 11/21/2016   Arthritis 10/15/2016   Spondylolisthesis of lumbar region 02/28/2015   Hyperlipidemia    Lumbar disc disease    Hypertension    CAD (coronary artery disease) 05/19/2012   GERD (gastroesophageal reflux disease)    Depression with anxiety    Chronic asthma 05/17/2011   Allergic rhinitis due to pollen 08/08/2010   History of benign positional vertigo    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 Annamaria Boots   Anemia    takes Fe -    Anginal pain ([place])    Ankylosing spondylitis ([place])    Anxiety    sees therapist Jackelyn Hoehn   Arthritis    Asthma    Dr. 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 Wynonia Lawman   CHEILECTOMY  10/16/2011   Procedure: CHEILECTOMY;  Surgeon: Colin Rhein, MD;  Location: [place];  Service: Orthopedics;  Laterality: Right;   CHOLECYSTECTOMY     COLONOSCOPY     CORONARY ANGIOPLASTY     1995 after MI   CORONARY ANGIOPLASTY WITH STENT PLACEMENT     1999    ESOPHAGOGASTRODUODENOSCOPY  02/18/2012   Procedure: ESOPHAGOGASTRODUODENOSCOPY (EGD);  Surgeon: Cleotis Nipper, MD;  Location: [facility];  Service: Endoscopy;  Laterality: N/A;   FOOT SURGERY     joint scraped, arthritis right foot   HERNIA REPAIR     LUMBAR DISC SURGERY     x2   right index finger  mass removed- arthritis     Dr. Amedeo Plenty   [facility]  Right 05/18/2021   medial toe consistent with callus   SKIN  BIOPSY Left 05/17/2021   actinic keratosis   SPINAL CORD STIMULATOR BATTERY EXCHANGE N/A 10/11/2021   Procedure: SPINAL CORD STIMULATOR BATTERY EXCHANGE;  Surgeon: Melina Schools, MD;  Location: [place];  Service: Orthopedics;  Laterality: N/A;  60 mins   SPINAL CORD STIMULATOR INSERTION N/A 11/21/2016   Procedure: LUMBAR SPINAL CORD STIMULATOR INSERTION;  Surgeon: Melina Schools, MD;  Location: [place];  Service: Orthopedics;  Laterality: N/A;  Requests  2.5 hrs   TONSILLECTOMY     1953   UPPER GI ENDOSCOPY  [id number]   VASECTOMY     1980   Social History   Tobacco Use   Smoking status: Never   Smokeless tobacco: Never  Vaping Use   Vaping Use: Never used  Substance Use Topics   Alcohol use: Yes    Comment: occasional   Drug use: No   Family History  Problem Relation Age of Onset   Heart disease Father    Dementia Father    Diabetes Mother    Dementia Mother    Heart disease Mother    Chronic fatigue Daughter    Heart disease Sister        stent ,CAD   Arthritis Sister    Cancer Neg Hx    No Active Allergies    Patient Care Team: Denita Lung, MD as PCP - General (Family Medicine) Richardo Priest, MD as PCP - Cardiology (Cardiology) Jacolyn Reedy, MD as Consulting Physician (Cardiology) Deneise Lever, MD as Consulting Physician (Pulmonary Disease)   Outpatient Medications Prior to Visit  Medication Sig Note   atorvastatin (LIPITOR) 80 MG tablet TAKE 1 TABLET BY MOUTH  DAILY    azelastine (ASTELIN) 0.1 % nasal spray Place 2 sprays into both nostrils 2 (two) times daily. Use in each nostril as directed    buPROPion (WELLBUTRIN XL) 300 MG 24 hr tablet TAKE 1 TABLET BY MOUTH  DAILY    calcium carbonate (OS-CAL) 600 MG TABS tablet Take 600 mg by mouth daily with breakfast.    cetirizine (ZYRTEC) 10 MG tablet Take 10 mg by mouth daily.    clopidogrel (PLAVIX) 75 MG tablet TAKE 1 TABLET BY MOUTH  DAILY    desvenlafaxine (PRISTIQ) 50 MG 24 hr tablet TAKE 1 TABLET BY  MOUTH  DAILY    diltiazem (CARDIZEM CD) 240 MG 24 hr capsule TAKE 1 CAPSULE BY MOUTH  DAILY    ferrous sulfate 325 (65 FE) MG tablet Take 325 mg by mouth once a week.    Flaxseed, Linseed, (FLAX SEED OIL) 1000 MG CAPS Take 1,000 mg by mouth every 7 (seven) days.    GLUCOSAMINE CHONDROITIN COMPLX PO Take 1 tablet by mouth daily.    guaiFENesin (MUCINEX) 600 MG 12 hr tablet Take 600 mg by mouth 2 (two) times daily as needed for cough.    meclizine (ANTIVERT) 25 MG tablet 1 twice a day as needed for dizziness    montelukast (SINGULAIR) 10 MG tablet Take 1 tablet (10 mg total) by mouth at bedtime.    Multiple Vitamins-Minerals (MULTIVITAMIN WITH MINERALS) tablet Take 1 tablet by mouth daily.    pantoprazole (PROTONIX) 40 MG tablet Take 1 tablet (40 mg total) by mouth daily.    sucralfate (CARAFATE) 1 GM/10ML suspension Take 10 mLs (1 g total) by mouth daily.    umeclidinium-vilanterol (ANORO ELLIPTA) 62.5-25 MCG/INH AEPB INHALE 1 PUFF BY MOUTH EVERY DAY    [DISCONTINUED] methylphenidate (CONCERTA) 36 MG PO CR tablet Take 1 tablet (36 mg total) by mouth daily.    PROAIR HFA 108 (90 Base) MCG/ACT inhaler TAKE 2 PUFFS BY MOUTH EVERY 4 HOURS AS NEEDED (Patient not taking: Reported on 12/07/2021) 12/24/2021: Prn last dose few month ago   tiZANidine (ZANAFLEX) 4 MG tablet Take 1 tablet (4 mg total) by mouth 2 (two) times daily as needed for muscle spasms. (Patient not taking: Reported on 12/24/2021)    [DISCONTINUED] ALPRAZolam (XANAX) 0.25  MG tablet TAKE 1 TABLET BY MOUTH TWICE A DAY AS NEEDED (Patient not taking: Reported on 12/07/2021)    No facility-administered medications prior to visit.    Review of Systems  All other systems reviewed and are negative.         Objective:     BP 102/66   Pulse 83   Temp 97.7 F (36.5 C)   Ht 5' 4.5" (1.638 m)   Wt 158 lb 9.6 oz (71.9 kg)   SpO2 96%   BMI 26.80 kg/m  BP Readings from Last 3 Encounters:  12/24/21 102/66  12/07/21 110/62  11/14/21  118/72   Wt Readings from Last 3 Encounters:  12/24/21 158 lb 9.6 oz (71.9 kg)  12/07/21 162 lb 12.8 oz (73.8 kg)  11/14/21 159 lb 12.8 oz (72.5 kg)      Physical Exam  Alert and in no distress. Tympanic membranes and canals are normal. Pharyngeal area is normal. Neck is supple without adenopathy or thyromegaly. Cardiac exam shows a regular sinus rhythm without murmurs or gallops. Lungs are clear to auscultation.  Full motion of the hip without pain.  No tenderness to palpation of the left knee.  No effusion noted.  Negative anterior drawer.  Negative McMurray's testing as well as normal lateral and medial collateral ligaments intact.   Lab Results  Component Value Date   WBC 7.1 10/03/2021   HGB 12.7 (L) 10/03/2021   HCT 39.7 10/03/2021   MCV 82.0 10/03/2021   MCH 26.2 10/03/2021   RDW 17.3 (H) 10/03/2021   PLT 270 44/06/270   Last metabolic panel Lab Results  Component Value Date   GLUCOSE 98 09/11/2021   NA 141 09/11/2021   K 4.3 09/11/2021   CL 107 (H) 09/11/2021   CO2 22 09/11/2021   BUN 17 09/11/2021   CREATININE 0.80 09/11/2021   EGFR 92 09/11/2021   CALCIUM 9.0 09/11/2021   PROT 6.5 09/11/2021   ALBUMIN 4.2 09/11/2021   LABGLOB 2.3 09/11/2021   AGRATIO 1.8 09/11/2021   BILITOT 0.2 09/11/2021   ALKPHOS 76 09/11/2021   AST 17 09/11/2021   ALT 19 09/11/2021   ANIONGAP 7 11/13/2016   Last lipids Lab Results  Component Value Date   CHOL 91 (L) 09/11/2021   HDL 38 (L) 09/11/2021   LDLCALC 37 09/11/2021   TRIG 78 09/11/2021   CHOLHDL 2.4 09/11/2021        Assessment & Plan:    Chronic pain of left knee - Plan: DG Knee Complete 4 Views Left  Lumbar disc disease  Spinal cord stimulator status  Depression with anxiety - Plan: ALPRAZolam (XANAX) 0.25 MG tablet  Coronary artery disease of native artery of native heart with stable angina pectoris (HCC)  Gastroesophageal reflux disease, unspecified whether esophagitis present  Attention deficit  disorder of adult with hyperactivity - Plan: methylphenidate (CONCERTA) 54 MG PO CR tablet  Mixed hyperlipidemia  Arthritis  Other chronic pain  Erectile dysfunction, unspecified erectile dysfunction type  Dizziness  Anemia, unspecified type - Plan: CBC with Differential/Platelet  Immunization History  Administered Date(s) Administered   Fluad Quad(high Dose 65+) 04/14/2020, 03/13/2021   Influenza Split 04/23/2011   Influenza Whole 03/20/2009, 03/19/2010   Influenza, High Dose Seasonal PF 04/12/2016, 02/26/2017, 04/02/2018, 01/30/2019, 01/30/2019   Influenza,inj,Quad PF,6+ Mos 03/05/2013, 02/23/2014, 04/21/2015   Influenza-Unspecified 04/02/2018, 01/30/2019   PFIZER Comirnaty(Gray Top)Covid-19 Tri-Sucrose Vaccine 06/22/2020   PFIZER(Purple Top)SARS-COV-2 Vaccination 07/02/2019, 07/23/2019   Pfizer Covid-19 Vaccine Bivalent Booster 64yrs & up  03/13/2021   Pneumococcal Conjugate-13 03/04/2014   Pneumococcal Polysaccharide-23 06/10/2005, 05/19/2012   Td 10/20/2006   Tdap 10/20/2006, 02/26/2017   Zoster Recombinat (Shingrix) 12/10/2016, 07/15/2017   Zoster, Live 01/01/2007    Health Maintenance  Topic Date Due   COVID-19 Vaccine (5 - Pfizer series) 07/14/2021   INFLUENZA VACCINE  01/08/2022   TETANUS/TDAP  02/27/2027   Pneumonia Vaccine 37+ Years old  Completed   Hepatitis C Screening  Completed   Zoster Vaccines- Shingrix  Completed   HPV VACCINES  Aged Out   COLONOSCOPY (Pts 45-79yrs Insurance coverage will need to be confirmed)  Discontinued  Encouraged him to discuss the spinal stimulator since he is still having some pain which I think is contributing to his shuffling gait.  I do not think this is necessarily Parkinson's.  Also recommend he discuss physical therapy with his orthopedic surgeon to help with his back pain as well as his shuffling gait.  Recommend 2 Tylenol 4 times per day for the pain.  We will also check an x-ray on that.  I will also increase his Concerta  to see if that will help with his ADD symptoms.  Discussed health benefits of physical activity, and encouraged him to engage in regular exercise appropriate for his age and condition.  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 (Chronic)   Relevant Medications   methylphenidate (CONCERTA) 54 MG PO CR tablet   CAD (coronary artery disease) (Chronic)   Depression with anxiety (Chronic)   Relevant Medications   ALPRAZolam (XANAX) 0.25 MG tablet   GERD (gastroesophageal reflux disease) (Chronic)   Hyperlipidemia (Chronic)   Lumbar disc disease (Chronic)   Arthritis   Chronic pain   Dizziness   Erectile dysfunction   Spinal cord stimulator status   Other Visit Diagnoses     Chronic pain of left knee    -  Primary   Relevant Orders   DG Knee Complete 4 Views Left   Anemia, unspecified type       Relevant Orders   CBC with Differential/Platelet      Return in about 1 year (around 12/25/2022) for awv/cpe.     Jill Alexanders, MD

## 2021-12-24 NOTE — Patient Instructions (Addendum)
Health Maintenance, Male Take 2 Tylenol 4 times per day for your knee pain Adopting a healthy lifestyle and getting preventive care are important in promoting health and wellness. Ask your health care provider about: The right schedule for you to have regular tests and exams. Things you can do on your own to prevent diseases and keep yourself healthy. What should I know about diet, weight, and exercise? Eat a healthy diet  Eat a diet that includes plenty of vegetables, fruits, low-fat dairy products, and lean protein. Do not eat a lot of foods that are high in solid fats, added sugars, or sodium. Maintain a healthy weight Body mass index (BMI) is a measurement that can be used to identify possible weight problems. It estimates body fat based on height and weight. Your health care provider can help determine your BMI and help you achieve or maintain a healthy weight. Get regular exercise Get regular exercise. This is one of the most important things you can do for your health. Most adults should: Exercise for at least 150 minutes each week. The exercise should increase your heart rate and make you sweat (moderate-intensity exercise). Do strengthening exercises at least twice a week. This is in addition to the moderate-intensity exercise. Spend less time sitting. Even light physical activity can be beneficial. Watch cholesterol and blood lipids Have your blood tested for lipids and cholesterol at 76 years of age, then have this test every 5 years. You may need to have your cholesterol levels checked more often if: Your lipid or cholesterol levels are high. You are older than 76 years of age. You are at high risk for heart disease. What should I know about cancer screening? Many types of cancers can be detected early and may often be prevented. Depending on your health history and family history, you may need to have cancer screening at various ages. This may include screening for: Colorectal  cancer. Prostate cancer. Skin cancer. Lung cancer. What should I know about heart disease, diabetes, and high blood pressure? Blood pressure and heart disease High blood pressure causes heart disease and increases the risk of stroke. This is more likely to develop in people who have high blood pressure readings or are overweight. Talk with your health care provider about your target blood pressure readings. Have your blood pressure checked: Every 3-5 years if you are 25-4 years of age. Every year if you are 53 years old or older. If you are between the ages of 73 and 64 and are a current or former smoker, ask your health care provider if you should have a one-time screening for abdominal aortic aneurysm (AAA). Diabetes Have regular diabetes screenings. This checks your fasting blood sugar level. Have the screening done: Once every three years after age 39 if you are at a normal weight and have a low risk for diabetes. More often and at a younger age if you are overweight or have a high risk for diabetes. What should I know about preventing infection? Hepatitis B If you have a higher risk for hepatitis B, you should be screened for this virus. Talk with your health care provider to find out if you are at risk for hepatitis B infection. Hepatitis C Blood testing is recommended for: Everyone born from [birth year] through 1965. Anyone with known risk factors for hepatitis C. Sexually transmitted infections (STIs) You should be screened each year for STIs, including gonorrhea and chlamydia, if: You are sexually active and are younger than 76 years of  age. Bonita Quin are older than 76 years of age and your health care provider tells you that you are at risk for this type of infection. Your sexual activity has changed since you were last screened, and you are at increased risk for chlamydia or gonorrhea. Ask your health care provider if you are at risk. Ask your health care provider about whether you are at  high risk for HIV. Your health care provider may recommend a prescription medicine to help prevent HIV infection. If you choose to take medicine to prevent HIV, you should first get tested for HIV. You should then be tested every 3 months for as long as you are taking the medicine. Follow these instructions at home: Alcohol use Do not drink alcohol if your health care provider tells you not to drink. If you drink alcohol: Limit how much you have to 0-2 drinks a day. Know how much alcohol is in your drink. In the [place] one drink equals one 12 oz bottle of beer (355 mL), one 5 oz glass of wine (148 mL), or one 1 oz glass of hard liquor (44 mL). Lifestyle Do not use any products that contain nicotine or tobacco. These products include cigarettes, chewing tobacco, and vaping devices, such as e-cigarettes. If you need help quitting, ask your health care provider. Do not use street drugs. Do not share needles. Ask your health care provider for help if you need support or information about quitting drugs. General instructions Schedule regular health, dental, and eye exams. Stay current with your vaccines. Tell your health care provider if: You often feel depressed. You have ever been abused or do not feel safe at home. Summary Adopting a healthy lifestyle and getting preventive care are important in promoting health and wellness. Follow your health care provider's instructions about healthy diet, exercising, and getting tested or screened for diseases. Follow your health care provider's instructions on monitoring your cholesterol and blood pressure. This information is not intended to replace advice given to you by your health care provider. Make sure you discuss any questions you have with your health care provider. Document Revised: 10/16/2020 Document Reviewed: 10/16/2020 Elsevier Patient Education  2023 [employer].

## 2021-12-25 DIAGNOSIS — I739 Peripheral vascular disease, unspecified: Secondary | ICD-10-CM

## 2021-12-25 HISTORY — DX: Peripheral vascular disease, unspecified: I73.9

## 2022-01-01 ENCOUNTER — Other Ambulatory Visit: Payer: Self-pay

## 2022-01-01 DIAGNOSIS — G8929 Other chronic pain: Secondary | ICD-10-CM

## 2022-01-03 ENCOUNTER — Other Ambulatory Visit: Payer: Self-pay | Admitting: Cardiology

## 2022-01-03 DIAGNOSIS — I2510 Atherosclerotic heart disease of native coronary artery without angina pectoris: Secondary | ICD-10-CM

## 2022-01-08 ENCOUNTER — Other Ambulatory Visit: Payer: Self-pay | Admitting: Family Medicine

## 2022-01-08 DIAGNOSIS — E785 Hyperlipidemia, unspecified: Secondary | ICD-10-CM

## 2022-01-08 NOTE — Telephone Encounter (Signed)
This was filled for 6 months on 09/2021

## 2022-01-24 ENCOUNTER — Other Ambulatory Visit: Payer: Self-pay | Admitting: Family Medicine

## 2022-01-24 DIAGNOSIS — E785 Hyperlipidemia, unspecified: Secondary | ICD-10-CM

## 2022-01-24 NOTE — Telephone Encounter (Signed)
Pt was given a 6 month supply in April. Pt shouldn't be out until October

## 2022-01-30 ENCOUNTER — Telehealth: Payer: Self-pay | Admitting: Family Medicine

## 2022-01-30 DIAGNOSIS — F909 Attention-deficit hyperactivity disorder, unspecified type: Secondary | ICD-10-CM

## 2022-01-30 MED ORDER — METHYLPHENIDATE HCL ER (OSM) 54 MG PO TBCR: 54.0000 mg | EXTENDED_RELEASE_TABLET | ORAL | 0 refills | Status: DC

## 2022-01-30 NOTE — Telephone Encounter (Signed)
Pt needs refill Concerta [facility] [address]

## 2022-02-13 ENCOUNTER — Encounter: Payer: Self-pay | Admitting: Internal Medicine

## 2022-03-15 ENCOUNTER — Telehealth: Payer: Self-pay | Admitting: Family Medicine

## 2022-03-15 DIAGNOSIS — F909 Attention-deficit hyperactivity disorder, unspecified type: Secondary | ICD-10-CM

## 2022-03-15 NOTE — Telephone Encounter (Signed)
Needs refill on Concerta  [facility] [address]

## 2022-03-16 MED ORDER — METHYLPHENIDATE HCL ER (OSM) 54 MG PO TBCR: 54.0000 mg | EXTENDED_RELEASE_TABLET | ORAL | 0 refills | Status: DC

## 2022-03-19 ENCOUNTER — Encounter: Payer: Self-pay | Admitting: Internal Medicine

## 2022-04-01 ENCOUNTER — Encounter: Payer: Self-pay | Admitting: Internal Medicine

## 2022-04-12 ENCOUNTER — Other Ambulatory Visit: Payer: Self-pay

## 2022-04-12 DIAGNOSIS — K219 Gastro-esophageal reflux disease without esophagitis: Secondary | ICD-10-CM

## 2022-04-12 MED ORDER — PANTOPRAZOLE SODIUM 40 MG PO TBEC: 40.0000 mg | DELAYED_RELEASE_TABLET | Freq: Every day | ORAL | 1 refills | Status: DC

## 2022-04-28 ENCOUNTER — Other Ambulatory Visit: Payer: Self-pay | Admitting: Family Medicine

## 2022-04-28 DIAGNOSIS — E785 Hyperlipidemia, unspecified: Secondary | ICD-10-CM

## 2022-05-14 ENCOUNTER — Telehealth: Payer: Self-pay | Admitting: Family Medicine

## 2022-05-14 NOTE — Telephone Encounter (Signed)
Pt informed

## 2022-05-14 NOTE — Telephone Encounter (Signed)
Pt was filling his 2 week med container & realized he has 2 different bottles of Wellbutrin 150mg  & 300mg .  He doesn't know which he has been taking.  He says he's doing well mentally (no issues) on whichever one he's been taking but he's just not sure which strength that is. He has pills left in both bottles.  The med list says 300mg  but in one of the last office notes says he's taking 150mg .  Which strength should he be on?

## 2022-05-15 ENCOUNTER — Encounter: Payer: Self-pay | Admitting: Family Medicine

## 2022-05-15 ENCOUNTER — Ambulatory Visit (INDEPENDENT_AMBULATORY_CARE_PROVIDER_SITE_OTHER): Payer: Medicare Other | Admitting: Family Medicine

## 2022-05-15 VITALS — BP 104/64 | HR 66 | Wt 167.0 lb

## 2022-05-15 DIAGNOSIS — M25552 Pain in left hip: Secondary | ICD-10-CM | POA: Diagnosis not present

## 2022-05-15 DIAGNOSIS — R202 Paresthesia of skin: Secondary | ICD-10-CM

## 2022-05-15 DIAGNOSIS — Z9689 Presence of other specified functional implants: Secondary | ICD-10-CM | POA: Diagnosis not present

## 2022-05-15 DIAGNOSIS — Z23 Encounter for immunization: Secondary | ICD-10-CM | POA: Diagnosis not present

## 2022-05-15 NOTE — Patient Instructions (Signed)
Do your PT and work on proper posturing

## 2022-05-15 NOTE — Progress Notes (Signed)
   Subjective:    Patient ID: [name], male    DOB: [birth year], 76 y.o.   MRN: [id number]  HPI He is here initially for evaluation of a several month history of left hip pain.  He does have a previous history of low back pain and has a spinal cord stimulator.  He has been involved in physical therapy for his back but states that this is a different kind of pain.  He also notes that he tends to walk leaning forward with a shuffling gait and when asked further why he did that he could not give me a good answer.  He apparently has fallen once or twice.  He states that the hip pain is different than the chronic back pain he has had difficulty with.  He did do physical therapy for several months and was given instructions to do at home stating it did indeed help but he has not continued it at home.  We then also described a tingling sensation in his both feet and the fact that he had seen a podiatrist for this in the past and was told to buy shoes that fit him better.   Review of Systems     Objective:   Physical Exam He has noted to have a gait that is slightly forward in nature with shuffling.  When asked to stand up straight he can do this without difficulty.  No tenderness palpation over the spine but some slight discomfort on the left in the area of the gluteus medius and minimus.  No tenderness over SI joint.  Negative straight leg raising.  DTRs are normal.  Exam of his feet shows normal sensation and reflexes.       Assessment & Plan:  Pain of left hip  Spinal cord stimulator status  Need for COVID-19 vaccine - Plan: Pfizer Fall 2023 Covid-19 Vaccine 49yrs and older  Need for immunization against influenza - Plan: Flu Vaccine QUAD High Dose(Fluad)  Tingling sensation I explained that I thought the pain he was having was more musculoskeletal and definitely encouraged him to do the physical therapy at home.  Also discussed his shuffling gait and posture strongly encouraged him  to stand up straight as he can since he is having no difficulty doing that but is just gotten in the habit of leaning forward.  Explained that that makes him look like he is going to fall forward.  He is to work harder on that.  Recommend looser fitting shoes.  He then asked questions about itching sensation in his right lower extremity.  Exam of the right lower extremity showed no lesions.  Recommend cortisone cream for that.  Towards the end of the encounter he then mentions several other things that he would like to discuss and ask him to make a return appointment for that.  Over 30 minutes spent discussing all these issues

## 2022-06-29 ENCOUNTER — Other Ambulatory Visit: Payer: Self-pay | Admitting: Cardiology

## 2022-07-09 ENCOUNTER — Ambulatory Visit (INDEPENDENT_AMBULATORY_CARE_PROVIDER_SITE_OTHER): Payer: Medicare Other | Admitting: Family Medicine

## 2022-07-09 ENCOUNTER — Encounter: Payer: Self-pay | Admitting: Family Medicine

## 2022-07-09 VITALS — BP 124/72 | HR 76 | Temp 98.2°F | Resp 16 | Wt 162.6 lb

## 2022-07-09 DIAGNOSIS — I10 Essential (primary) hypertension: Secondary | ICD-10-CM

## 2022-07-09 DIAGNOSIS — I25118 Atherosclerotic heart disease of native coronary artery with other forms of angina pectoris: Secondary | ICD-10-CM | POA: Diagnosis not present

## 2022-07-09 DIAGNOSIS — E782 Mixed hyperlipidemia: Secondary | ICD-10-CM | POA: Diagnosis not present

## 2022-07-09 DIAGNOSIS — F909 Attention-deficit hyperactivity disorder, unspecified type: Secondary | ICD-10-CM | POA: Diagnosis not present

## 2022-07-09 MED ORDER — METHYLPHENIDATE HCL ER (OSM) 54 MG PO TBCR: 54.0000 mg | EXTENDED_RELEASE_TABLET | ORAL | 0 refills | Status: DC

## 2022-07-09 NOTE — Progress Notes (Signed)
   Subjective:    Patient ID: [name], male    DOB: 1946/01/24, 77 y.o.   MRN: [id number]  HPI He is here today complaining of tremor that apparently is intermittent in nature and not at the present time.  He also complains of gait disturbance and weakness.  He had a longer list of other issues 1 of which was dyspnea on exertion.  No PND, chest pain, diaphoresis.  He has a previous history of CAD with PCI.  He does follow-up with cardiology.  The last note was reviewed.   Review of Systems     Objective:   Physical Exam Alert and in no distress.  No tremor noted.  Cardiac exam shows regular rhythm without murmurs or gallops.  Lungs are clear to auscultation.  EKG read by me shows a sinus rhythm otherwise normal.       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  Explained to that the tremor and the other concerns that he has had will require more time and to make an appointment and make sure that he tells the front office that he has multiple issues that he wants to deal with.

## 2022-07-10 LAB — COMPREHENSIVE METABOLIC PANEL
ALT: 18 IU/L (ref 0–44)
AST: 16 IU/L (ref 0–40)
Albumin/Globulin Ratio: 1.7 (ref 1.2–2.2)
Albumin: 4.6 g/dL (ref 3.8–4.8)
Alkaline Phosphatase: 77 IU/L (ref 44–121)
BUN/Creatinine Ratio: 11 (ref 10–24)
BUN: 11 mg/dL (ref 8–27)
Bilirubin Total: 0.3 mg/dL (ref 0.0–1.2)
CO2: 21 mmol/L (ref 20–29)
Calcium: 9.7 mg/dL (ref 8.6–10.2)
Chloride: 106 mmol/L (ref 96–106)
Creatinine, Ser: 1 mg/dL (ref 0.76–1.27)
Globulin, Total: 2.7 g/dL (ref 1.5–4.5)
Glucose: 95 mg/dL (ref 70–99)
Potassium: 4.3 mmol/L (ref 3.5–5.2)
Sodium: 142 mmol/L (ref 134–144)
Total Protein: 7.3 g/dL (ref 6.0–8.5)
eGFR: 78 mL/min/{1.73_m2} (ref >59)

## 2022-07-10 LAB — CBC WITH DIFFERENTIAL/PLATELET
Basophils Absolute: 0 10*3/uL (ref 0.0–0.2)
Basos: 0 %
EOS (ABSOLUTE): 0 10*3/uL (ref 0.0–0.4)
Eos: 0 %
Hematocrit: 39.1 % (ref 37.5–51.0)
Hemoglobin: 12.6 g/dL — ABNORMAL LOW (ref 13.0–17.7)
Immature Grans (Abs): 0 10*3/uL (ref 0.0–0.1)
Immature Granulocytes: 0 %
Lymphocytes Absolute: 1.9 10*3/uL (ref 0.7–3.1)
Lymphs: 21 %
MCH: 27 pg (ref 26.6–33.0)
MCHC: 32.2 g/dL (ref 31.5–35.7)
MCV: 84 fL (ref 79–97)
Monocytes Absolute: 0.7 10*3/uL (ref 0.1–0.9)
Monocytes: 7 %
Neutrophils Absolute: 6.6 10*3/uL (ref 1.4–7.0)
Neutrophils: 72 %
Platelets: 320 10*3/uL (ref 150–450)
RBC: 4.66 x10E6/uL (ref 4.14–5.80)
RDW: 14.6 % (ref 11.6–15.4)
WBC: 9.2 10*3/uL (ref 3.4–10.8)

## 2022-07-10 LAB — LIPID PANEL
Chol/HDL Ratio: 2.5 ratio (ref 0.0–5.0)
Cholesterol, Total: 133 mg/dL (ref 100–199)
HDL: 54 mg/dL (ref >39)
LDL Chol Calc (NIH): 58 mg/dL (ref 0–99)
Triglycerides: 121 mg/dL (ref 0–149)
VLDL Cholesterol Cal: 21 mg/dL (ref 5–40)

## 2022-07-17 NOTE — Addendum Note (Signed)
Addended by: Denita Lung on: 07/17/2022 08:43 AM   Modules accepted: Orders

## 2022-07-18 NOTE — Progress Notes (Signed)
Cardiology Office Note:    Date:  07/19/2022   ID:  [name], DOB [birth year], MRN [id number]  PCP:  Denita Lung, MD  Cardiologist:  Shirlee More, MD    Referring MD: Denita Lung, MD    ASSESSMENT:    1. Coronary artery disease of native artery of native heart with stable angina pectoris ([place])   2. Left ventricular dysfunction   3. Primary hypertension   4. 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  He has statin myopathy and will discontinue atorvastatin will contact me in 1 month by MyChart if improved nonstatin treatment like PCSK9 inhibitor or bempedoic acid   Next appointment: 9 months   Medication Adjustments/Labs and Tests Ordered: Current medicines are reviewed at length with the patient today.  Concerns regarding medicines are outlined above.  No orders of the defined types were placed in this encounter.  No orders of the defined types were placed in this encounter.   Follow-up CAD   History of Present Illness:    [name] is a 77 y.o. male with a hx of CAD with PCI and balloon angioplasty in 1995 subsequent PCI and stent right coronary artery 1999 mild LV dysfunction EF 40 to 45% subsequently normalizing by echo in 2021 hypertensive heart disease hyperlipidemia and top normal ascending aortic diameter 38 mm last seen 09/11/2021.   Recent evaluation PCP 07/09/2022 was for tremor and gait dysfunction.  Compliance with diet, lifestyle and medications: Yes  From a CAD perspective doing well not having angina edema shortness of breath  palpitation. He has developed gait dysfunction weakness falls and it may be a statin myopathy. He will withdrawal his high intensity statin for a month and he will contact me to let me know if he is improved if he is I would avoid statins and we can either use bempedoic acid or injectable PCSK9 inhibitor He request me to examine his feet he has varicosities Past Medical History:  Diagnosis Date   ADD (attention deficit disorder)    Allergic rhinitis    skin test 02/23/08 vaccine 2009; allergy shots weekly through Dr. Annamaria Boots   Anemia    takes Fe -    Anginal pain ([place])    Ankylosing spondylitis ([place])    Anxiety    sees therapist Jackelyn Hoehn   Arthritis    Asthma    Dr. 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 Wynonia Lawman   CHEILECTOMY  10/16/2011   Procedure: CHEILECTOMY;  Surgeon: Colin Rhein, MD;  Location: [place];  Service: Orthopedics;  Laterality: Right;   CHOLECYSTECTOMY     COLONOSCOPY     CORONARY ANGIOPLASTY      1995 after MI   CORONARY ANGIOPLASTY WITH STENT PLACEMENT     1999    ESOPHAGOGASTRODUODENOSCOPY  02/18/2012   Procedure: ESOPHAGOGASTRODUODENOSCOPY (EGD);  Surgeon: Cleotis Nipper, MD;  Location: [facility];  Service: Endoscopy;  Laterality: N/A;   FOOT SURGERY     joint scraped, arthritis right foot   HERNIA REPAIR     LUMBAR DISC SURGERY     x2   right index finger  mass removed- arthritis     Dr. Amedeo Plenty   [facility]  Right 05/18/2021   medial toe consistent with callus   SKIN BIOPSY Left 05/17/2021   actinic keratosis   SPINAL CORD STIMULATOR BATTERY EXCHANGE N/A 10/11/2021   Procedure: SPINAL CORD STIMULATOR BATTERY EXCHANGE;  Surgeon: Melina Schools, MD;  Location: [place];  Service: Orthopedics;  Laterality: N/A;  60 mins   SPINAL CORD STIMULATOR INSERTION N/A 11/21/2016   Procedure: LUMBAR SPINAL CORD STIMULATOR INSERTION;  Surgeon: Melina Schools, MD;  Location: [place];  Service: Orthopedics;  Laterality: N/A;  Requests 2.5 hrs   TONSILLECTOMY     1953   [facility] ENDOSCOPY  [id number]   VASECTOMY     1980    Current Medications: Current Meds  Medication Sig   ALPRAZolam (XANAX) 0.25 MG tablet Take 1 tablet (0.25 mg total) by mouth 2 (two) times daily as needed.   atorvastatin (LIPITOR) 80 MG tablet TAKE 1 TABLET BY MOUTH DAILY   azelastine (ASTELIN) 0.1 % nasal spray Place 2 sprays into both nostrils 2 (two) times daily. Use in each nostril as directed   buPROPion (WELLBUTRIN XL) 300 MG 24 hr tablet TAKE 1 TABLET BY MOUTH  DAILY   calcium carbonate (OS-CAL) 600 MG TABS tablet Take 600 mg by mouth daily with breakfast.   cetirizine (ZYRTEC) 10 MG tablet Take 10 mg by mouth daily.   clopidogrel (PLAVIX) 75 MG tablet TAKE 1 TABLET BY MOUTH  DAILY   desvenlafaxine (PRISTIQ) 50 MG 24 hr tablet TAKE 1 TABLET BY MOUTH  DAILY   diltiazem (CARDIZEM CD) 240 MG 24 hr capsule Take 1 capsule (240 mg total) by mouth daily.   ferrous sulfate 325 (65 FE) MG tablet Take 325 mg by mouth once a  week.   Flaxseed, Linseed, (FLAX SEED OIL) 1000 MG CAPS Take 1,000 mg by mouth every 7 (seven) days.   GLUCOSAMINE CHONDROITIN COMPLX PO Take 1 tablet by mouth daily.   guaiFENesin (MUCINEX) 600 MG 12 hr tablet Take 600 mg by mouth 2 (two) times daily as needed for cough.   meclizine (ANTIVERT) 25 MG tablet 1 twice a day as needed for dizziness   methylphenidate (CONCERTA) 54 MG PO CR tablet Take 1 tablet (54 mg total) by mouth every morning.   Multiple Vitamins-Minerals (MULTIVITAMIN WITH MINERALS) tablet Take 1 tablet by  mouth daily.   pantoprazole (PROTONIX) 40 MG tablet Take 1 tablet (40 mg total) by mouth daily.   PROAIR HFA 108 (90 Base) MCG/ACT inhaler TAKE 2 PUFFS BY MOUTH EVERY 4 HOURS AS NEEDED   sucralfate (CARAFATE) 1 GM/10ML suspension Take 10 mLs (1 g total) by mouth daily. (Patient taking differently: Take 1 g by mouth daily as needed (Constipation).)   umeclidinium-vilanterol (ANORO ELLIPTA) 62.5-25 MCG/INH AEPB INHALE 1 PUFF BY MOUTH EVERY DAY     Allergies:   Patient has no active allergies.   Social History   Socioeconomic History   Marital status: Married    Spouse name: Not on file   Number of children: Y   Years of education: Not on file   Highest education level: Not on file  Occupational History   Occupation: choir singer   Occupation: retired- Astronomer  Tobacco Use   Smoking status: Never   Smokeless tobacco: Never  Scientific laboratory technician Use: Never used  Substance and Sexual Activity   Alcohol use: Yes    Comment: occasional   Drug use: No   Sexual activity: Not Currently  Other Topics Concern   Not on file  Social History Narrative   Married, has 2 children, 49yo son, 75yo daughter with fibromyalgia, chronic fatigue syndrome, former Engineer, production.   Artist (paints)   Social Determinants of Health   Financial Resource Strain: Low Risk  (12/07/2021)   Overall Financial Resource Strain (CARDIA)    Difficulty of  Paying Living Expenses: Not hard at all  Food Insecurity: No Food Insecurity (12/07/2021)   Hunger Vital Sign    Worried About Running Out of Food in the Last Year: Never true    Ran Out of Food in the Last Year: Never true  Transportation Needs: No Transportation Needs (12/07/2021)   PRAPARE - Hydrologist (Medical): No    Lack of Transportation (Non-Medical): No  Physical Activity: Inactive (12/07/2021)   Exercise Vital Sign    Days of Exercise per Week: 0 days    Minutes of Exercise per Session: 0 min  Stress: No Stress Concern Present (12/07/2021)   [place]    Feeling of Stress : Not at all  Social Connections: Not on file     Family History: The patient's family history includes Arthritis in his sister; Chronic fatigue in his daughter; Dementia in his father and mother; Diabetes in his mother; Heart disease in his father, mother, and sister. There is no history of Cancer. ROS:   Please see the history of present illness.    All other systems reviewed and are negative.  EKGs/Labs/Other Studies Reviewed:    The following studies were reviewed today:    Recent Labs: 07/09/2022: ALT 18; BUN 11; Creatinine, Ser 1.00; Hemoglobin 12.6; Platelets 320; Potassium 4.3; Sodium 142  Recent Lipid Panel    Component Value Date/Time   CHOL 133 07/09/2022 1230   TRIG 121 07/09/2022 1230   HDL 54 07/09/2022 1230   CHOLHDL 2.5 07/09/2022 1230   CHOLHDL 3.4 10/15/2016 1056   VLDL 33 (H) 10/15/2016 1056   LDLCALC 58 07/09/2022 1230    Physical Exam:    VS:  BP 136/74 (BP Location: Left Arm, Patient Position: Sitting)   Pulse 74   Ht 5' 4.5" (1.638 m)   Wt 162 lb 9.6 oz (73.8 kg)   SpO2 98%   BMI  27.48 kg/m     Wt Readings from Last 3 Encounters:  07/19/22 162 lb 9.6 oz (73.8 kg)  07/09/22 162 lb 9.6 oz (73.8 kg)  05/15/22 167 lb (75.8 kg)     GEN: He has difficulty getting out of a  chair seating and has a shuffling gait well nourished, well developed in no acute distress HEENT: Normal NECK: No JVD; No carotid bruits LYMPHATICS: No lymphadenopathy CARDIAC: RRR, no murmurs, rubs, gallops RESPIRATORY:  Clear to auscultation without rales, wheezing or rhonchi  ABDOMEN: Soft, non-tender, non-distended MUSCULOSKELETAL:  No edema; No deformity  SKIN: Warm and dry NEUROLOGIC:  Alert and oriented x 3 PSYCHIATRIC:  Normal affect  Full pedal pulses mild varicosities  Seen with Jerl Santos CMA chaperone   Signed, Shirlee More, MD  07/19/2022 3:24 PM    [facility]

## 2022-07-19 ENCOUNTER — Encounter: Payer: Self-pay | Admitting: Cardiology

## 2022-07-19 ENCOUNTER — Ambulatory Visit: Payer: Medicare Other | Attending: Cardiology | Admitting: Cardiology

## 2022-07-19 VITALS — BP 136/74 | HR 74 | Ht 64.5 in | Wt 162.6 lb

## 2022-07-19 DIAGNOSIS — E782 Mixed hyperlipidemia: Secondary | ICD-10-CM | POA: Diagnosis not present

## 2022-07-19 DIAGNOSIS — I10 Essential (primary) hypertension: Secondary | ICD-10-CM

## 2022-07-19 DIAGNOSIS — I519 Heart disease, unspecified: Secondary | ICD-10-CM

## 2022-07-19 DIAGNOSIS — I25118 Atherosclerotic heart disease of native coronary artery with other forms of angina pectoris: Secondary | ICD-10-CM

## 2022-07-19 NOTE — Patient Instructions (Signed)
Medication Instructions:  Your physician has recommended you make the following change in your medication:  Stop Atorvastatin for 1 Month Send message through MyChart letting Dr. Bettina Gavia know how you are feeling in 1 month  *If you need a refill on your cardiac medications before your next appointment, please call your pharmacy*   Lab Work: NONE If you have labs (blood work) drawn today and your tests are completely normal, you will receive your results only by: [place] (if you have MyChart) OR A paper copy in the mail If you have any lab test that is abnormal or we need to change your treatment, we will call you to review the results.   Testing/Procedures: NONE   Follow-Up: At [facility], you and your health needs are our priority.  As part of our continuing mission to provide you with exceptional heart care, we have created designated Provider Care Teams.  These Care Teams include your primary Cardiologist (physician) and Advanced Practice Providers (APPs -  Physician Assistants and Nurse Practitioners) who all work together to provide you with the care you need, when you need it.  We recommend signing up for the patient portal called "MyChart".  Sign up information is provided on this After Visit Summary.  MyChart is used to connect with patients for Virtual Visits (Telemedicine).  Patients are able to view lab/test results, encounter notes, upcoming appointments, etc.  Non-urgent messages can be sent to your provider as well.   To learn more about what you can do with MyChart, go to [email or web address].    Your next appointment:   6 month(s)  Provider:   Shirlee More, MD    Other Instructions

## 2022-08-05 ENCOUNTER — Other Ambulatory Visit: Payer: Self-pay | Admitting: Family Medicine

## 2022-08-05 DIAGNOSIS — F418 Other specified anxiety disorders: Secondary | ICD-10-CM

## 2022-09-09 ENCOUNTER — Encounter (HOSPITAL_COMMUNITY): Payer: Self-pay | Admitting: Emergency Medicine

## 2022-09-09 ENCOUNTER — Emergency Department (HOSPITAL_COMMUNITY)
Admission: EM | Admit: 2022-09-09 | Discharge: 2022-09-10 | Disposition: A | Discharge status: Home / Self Care | Payer: Medicare Other | Attending: Emergency Medicine | Admitting: Emergency Medicine

## 2022-09-09 ENCOUNTER — Other Ambulatory Visit: Payer: Self-pay

## 2022-09-09 ENCOUNTER — Emergency Department (HOSPITAL_COMMUNITY): Payer: Medicare Other

## 2022-09-09 DIAGNOSIS — I10 Essential (primary) hypertension: Secondary | ICD-10-CM | POA: Diagnosis not present

## 2022-09-09 DIAGNOSIS — Z20822 Contact with and (suspected) exposure to covid-19: Secondary | ICD-10-CM | POA: Diagnosis not present

## 2022-09-09 DIAGNOSIS — Z7951 Long term (current) use of inhaled steroids: Secondary | ICD-10-CM | POA: Diagnosis not present

## 2022-09-09 DIAGNOSIS — W19XXXA Unspecified fall, initial encounter: Secondary | ICD-10-CM | POA: Insufficient documentation

## 2022-09-09 DIAGNOSIS — R4182 Altered mental status, unspecified: Secondary | ICD-10-CM | POA: Diagnosis present

## 2022-09-09 DIAGNOSIS — R059 Cough, unspecified: Secondary | ICD-10-CM | POA: Insufficient documentation

## 2022-09-09 DIAGNOSIS — J45909 Unspecified asthma, uncomplicated: Secondary | ICD-10-CM | POA: Diagnosis not present

## 2022-09-09 DIAGNOSIS — J449 Chronic obstructive pulmonary disease, unspecified: Secondary | ICD-10-CM | POA: Diagnosis not present

## 2022-09-09 DIAGNOSIS — R0981 Nasal congestion: Secondary | ICD-10-CM | POA: Insufficient documentation

## 2022-09-09 DIAGNOSIS — Z79899 Other long term (current) drug therapy: Secondary | ICD-10-CM | POA: Diagnosis not present

## 2022-09-09 DIAGNOSIS — Y92002 Bathroom of unspecified non-institutional (private) residence single-family (private) house as the place of occurrence of the external cause: Secondary | ICD-10-CM | POA: Diagnosis not present

## 2022-09-09 DIAGNOSIS — I2510 Atherosclerotic heart disease of native coronary artery without angina pectoris: Secondary | ICD-10-CM | POA: Diagnosis not present

## 2022-09-09 LAB — COMPREHENSIVE METABOLIC PANEL
ALT: 24 U/L (ref 0–44)
AST: 22 U/L (ref 15–41)
Albumin: 3.7 g/dL (ref 3.5–5.0)
Alkaline Phosphatase: 51 U/L (ref 38–126)
Anion gap: 11 (ref 5–15)
BUN: 7 mg/dL — ABNORMAL LOW (ref 8–23)
CO2: 23 mmol/L (ref 22–32)
Calcium: 9 mg/dL (ref 8.9–10.3)
Chloride: 104 mmol/L (ref 98–111)
Creatinine, Ser: 0.9 mg/dL (ref 0.61–1.24)
GFR, Estimated: >60 mL/min (ref >60)
Glucose, Bld: 100 mg/dL — ABNORMAL HIGH (ref 70–99)
Potassium: 3.2 mmol/L — ABNORMAL LOW (ref 3.5–5.1)
Sodium: 138 mmol/L (ref 135–145)
Total Bilirubin: 0.4 mg/dL (ref 0.3–1.2)
Total Protein: 7.1 g/dL (ref 6.5–8.1)

## 2022-09-09 LAB — CBC WITH DIFFERENTIAL/PLATELET
Abs Immature Granulocytes: 0.06 10*3/uL (ref 0.00–0.07)
Basophils Absolute: 0 10*3/uL (ref 0.0–0.1)
Basophils Relative: 0 %
Eosinophils Absolute: 0 10*3/uL (ref 0.0–0.5)
Eosinophils Relative: 0 %
HCT: 41.4 % (ref 39.0–52.0)
Hemoglobin: 13.6 g/dL (ref 13.0–17.0)
Immature Granulocytes: 1 %
Lymphocytes Relative: 10 %
Lymphs Abs: 1.1 10*3/uL (ref 0.7–4.0)
MCH: 28.2 pg (ref 26.0–34.0)
MCHC: 32.9 g/dL (ref 30.0–36.0)
MCV: 85.7 fL (ref 80.0–100.0)
Monocytes Absolute: 0.9 10*3/uL (ref 0.1–1.0)
Monocytes Relative: 9 %
Neutro Abs: 8.6 10*3/uL — ABNORMAL HIGH (ref 1.7–7.7)
Neutrophils Relative %: 80 %
Platelets: 189 10*3/uL (ref 150–400)
RBC: 4.83 MIL/uL (ref 4.22–5.81)
RDW: 18.2 % — ABNORMAL HIGH (ref 11.5–15.5)
WBC: 10.8 10*3/uL — ABNORMAL HIGH (ref 4.0–10.5)
nRBC: 0 % (ref 0.0–0.2)

## 2022-09-09 LAB — URINALYSIS, ROUTINE W REFLEX MICROSCOPIC
Bilirubin Urine: NEGATIVE
Glucose, UA: NEGATIVE mg/dL
Hgb urine dipstick: NEGATIVE
Ketones, ur: 5 mg/dL — AB
Leukocytes,Ua: NEGATIVE
Nitrite: NEGATIVE
Protein, ur: NEGATIVE mg/dL
Specific Gravity, Urine: 1.015 (ref 1.005–1.030)
pH: 7 (ref 5.0–8.0)

## 2022-09-09 LAB — RESP PANEL BY RT-PCR (RSV, FLU A&B, COVID)  RVPGX2
Influenza A by PCR: NEGATIVE
Influenza B by PCR: NEGATIVE
Resp Syncytial Virus by PCR: NEGATIVE
SARS Coronavirus 2 by RT PCR: NEGATIVE

## 2022-09-09 LAB — CK: Total CK: 67 U/L (ref 49–397)

## 2022-09-09 NOTE — ED Provider Notes (Incomplete)
[facility] Provider Note   CSN: [id number] Arrival date & time: 09/09/22  2019     History {Add pertinent medical, surgical, social history, OB history to HPI:1} Chief Complaint  Patient presents with   [name] is a 77 y.o. male.  HPI 77 year old male with history of CAD, anxiety, insomnia, depression, GERD, hypertension, lumbar disc disease with spinal cord stimulator, COPD, asthma presents to the ER with altered mental status.  History provided by patient and the wife at bedside.  Patient reportedly since yesterday has been more altered than baseline.  He reports nasal congestion and cough and taking Mucinex at night.  This morning the wife noticed that he was just more confused.  He stated that he was getting to take a shower and after 30 minutes his wife came to check on him and he had fallen in the bathroom.  He states he did not hit his head or lose consciousness.  He is on Plavix but no other anticoagulation.  He denies any abdominal pain or hip pain.  He states he has chronic back pain and has a spinal cord stimulator, and does not have any increased pain than normal.  He has been able to ambulate.  He reports calling his PCP who recommended he be evaluated in the emergency department.  He denies any history of stroke.  He has no chest pain.    Home Medications Prior to Admission medications   Medication Sig Start Date End Date Taking? Authorizing Provider  ALPRAZolam (XANAX) 0.25 MG tablet TAKE 1 TABLET BY MOUTH 2 TIMES DAILY AS NEEDED. 08/05/22   Denita Lung, MD  azelastine (ASTELIN) 0.1 % nasal spray Place 2 sprays into both nostrils 2 (two) times daily. Use in each nostril as directed 08/30/20   Denita Lung, MD  buPROPion (WELLBUTRIN XL) 300 MG 24 hr tablet TAKE 1 TABLET BY MOUTH  DAILY 10/17/21   Denita Lung, MD  calcium carbonate (OS-CAL) 600 MG TABS tablet Take 600 mg by mouth daily with breakfast.     [provider]  cetirizine (ZYRTEC) 10 MG tablet Take 10 mg by mouth daily.    [provider]  clopidogrel (PLAVIX) 75 MG tablet TAKE 1 TABLET BY MOUTH  DAILY 01/04/22   Richardo Priest, MD  desvenlafaxine (PRISTIQ) 50 MG 24 hr tablet TAKE 1 TABLET BY MOUTH  DAILY 08/29/21   Denita Lung, MD  diltiazem (CARDIZEM CD) 240 MG 24 hr capsule Take 1 capsule (240 mg total) by mouth daily. 07/01/22   Richardo Priest, MD  ferrous sulfate 325 (65 FE) MG tablet Take 325 mg by mouth once a week.    [provider]  Flaxseed, Linseed, (FLAX SEED OIL) 1000 MG CAPS Take 1,000 mg by mouth every 7 (seven) days.    [provider]  GLUCOSAMINE CHONDROITIN COMPLX PO Take 1 tablet by mouth daily.    [provider]  guaiFENesin (MUCINEX) 600 MG 12 hr tablet Take 600 mg by mouth 2 (two) times daily as needed for cough.    [provider]  meclizine (ANTIVERT) 25 MG tablet 1 twice a day as needed for dizziness 12/15/20   Denita Lung, MD  methylphenidate (CONCERTA) 54 MG PO CR tablet Take 1 tablet (54 mg total) by mouth every morning. 07/09/22   Denita Lung, MD  Multiple Vitamins-Minerals (MULTIVITAMIN WITH MINERALS) tablet Take 1 tablet by mouth  daily.    [provider]  pantoprazole (PROTONIX) 40 MG tablet Take 1 tablet (40 mg total) by mouth daily. 04/12/22   Denita Lung, MD  PROAIR HFA [phone] Base) MCG/ACT inhaler TAKE 2 PUFFS BY MOUTH EVERY 4 HOURS AS NEEDED 09/07/18   Denita Lung, MD  sucralfate (CARAFATE) 1 GM/10ML suspension Take 10 mLs (1 g total) by mouth daily. Patient taking differently: Take 1 g by mouth daily as needed (Constipation). 06/21/20   Richardo Priest, MD  umeclidinium-vilanterol (ANORO ELLIPTA) 62.5-25 MCG/INH AEPB INHALE 1 PUFF BY MOUTH EVERY DAY 09/13/20   Deneise Lever, MD      Allergies    Patient has no active allergies.    Review of Systems   Review of Systems  Physical Exam Updated Vital Signs BP (!)  144/89 (BP Location: Left Arm)   Pulse 99   Temp 99.9 F (37.7 C) (Oral)   Resp 17   Ht 5' 4.5" (1.638 m)   Wt 73.8 kg   SpO2 100%   BMI 27.50 kg/m  Physical Exam  ED Results / Procedures / Treatments   Labs (all labs ordered are listed, but only abnormal results are displayed) Labs Reviewed  CBC WITH DIFFERENTIAL/PLATELET - Abnormal; Notable for the following components:      Result Value   WBC 10.8 (*)    RDW 18.2 (*)    Neutro Abs 8.6 (*)    All other components within normal limits  URINALYSIS, ROUTINE W REFLEX MICROSCOPIC - Abnormal; Notable for the following components:   APPearance HAZY (*)    Ketones, ur 5 (*)    All other components within normal limits  RESP PANEL BY RT-PCR (RSV, FLU A&B, COVID)  RVPGX2  COMPREHENSIVE METABOLIC PANEL  CK    EKG None  Radiology No results found.  Procedures Procedures  {Document cardiac monitor, telemetry assessment procedure when appropriate:1}  Medications Ordered in ED Medications - No data to display  ED Course/ Medical Decision Making/ A&P   {   Click here for ABCD2, HEART and other calculatorsREFRESH Note before signing :1}                          Medical Decision Making Amount and/or Complexity of Data Reviewed Labs: ordered. Radiology: ordered.   ***  {Document critical care time when appropriate:1} {Document review of labs and clinical decision tools ie heart score, Chads2Vasc2 etc:1}  {Document your independent review of radiology images, and any outside records:1} {Document your discussion with family members, caretakers, and with consultants:1} {Document social determinants of health affecting pt's care:1} {Document your decision making why or why not admission, treatments were needed:1} Final Clinical Impression(s) / ED Diagnoses Final diagnoses:  None    Rx / DC Orders ED Discharge Orders     None

## 2022-09-09 NOTE — ED Triage Notes (Signed)
Pt BIB EMS from home pt has had worsening confusion since, confused at baseline per family. Fell this morning, found in the floor. Pt unsteady on his feet with shuffling gait. Pt states that he took a Xanax yesterday unsure if he took one today. Pt takes Plavix, denies hitting head. C/o generalized pain and chronic back pain.  EMS VS:  142/84 HR 100 98% RA  CBG 107

## 2022-09-10 ENCOUNTER — Emergency Department (HOSPITAL_COMMUNITY): Payer: Medicare Other

## 2022-09-10 MED ORDER — POTASSIUM CHLORIDE CRYS ER 20 MEQ PO TBCR
40.0000 meq | EXTENDED_RELEASE_TABLET | Freq: Once | ORAL | Status: AC
  Administered 2022-09-10: 40 meq via ORAL
  Filled 2022-09-10: qty 2

## 2022-09-10 MED ORDER — DOXYCYCLINE HYCLATE 100 MG PO TABS
100.0000 mg | ORAL_TABLET | Freq: Once | ORAL | Status: AC
  Administered 2022-09-10: 100 mg via ORAL
  Filled 2022-09-10: qty 1

## 2022-09-10 NOTE — Discharge Instructions (Addendum)
I recommend close follow-up with your primary care physician for reevaluation.  Please refer to the fall prevention handoff to prevent fall at home.  Please do not hesitate to return to emergency department if worrisome signs symptoms we discussed become apparent.

## 2022-09-10 NOTE — ED Provider Notes (Signed)
Patient care taken over at shift handoff from outgoing provider.  For full HPI, please refer to previous provider's note.  Plan is to follow-up with MRI.  If the patient is stable,negative MRI he can be discharged with PCP follow-up.  Physical Exam  BP 116/76   Pulse 79   Temp 98.4 F (36.9 C) (Oral)   Resp 17   Ht 5' 4.5" (1.638 m)   Wt 73.8 kg   SpO2 98%   BMI 27.50 kg/m   Physical Exam Vitals and nursing note reviewed.  Constitutional:      Appearance: Normal appearance.  HENT:     Head: Normocephalic and atraumatic.     Mouth/Throat:     Mouth: Mucous membranes are moist.  Eyes:     General: No scleral icterus. Cardiovascular:     Rate and Rhythm: Normal rate and regular rhythm.     Pulses: Normal pulses.     Heart sounds: Normal heart sounds.  Pulmonary:     Effort: Pulmonary effort is normal.     Breath sounds: Normal breath sounds.  Abdominal:     General: Abdomen is flat.     Palpations: Abdomen is soft.     Tenderness: There is no abdominal tenderness.  Musculoskeletal:        General: No deformity.  Skin:    General: Skin is warm.     Findings: No rash.  Neurological:     General: No focal deficit present.     Mental Status: He is alert.     Comments: Cranial nerves II through XII intact. Intact sensation to light touch in all 4 extremities. 5/5 strength in all 4 extremities. Intact finger-to-nose and heel-to-shin of all 4 extremities. No visual field cuts. No neglect noted. No aphasia noted.   Psychiatric:        Mood and Affect: Mood normal.     Procedures  Procedures  ED Course / MDM    Medical Decision Making Amount and/or Complexity of Data Reviewed Labs: ordered. Radiology: ordered.  Risk Prescription drug management.   77 year old male with a history of CAD, hypertension, lumbar disc disease with spinal cord stimulator, COPD presents for evaluation of fall and altered mental status.  Patient's wife reports that the patient has  increased altered than baseline since yesterday.  Workup included CMP with mild kalemia 2.3.  CBC with mild leukocytosis 10.8.  UA unremarkable.  Chest x-ray showed no active cardiopulmonary disease.  CT head negative for intracranial hemorrhage.  Patient's ambulated by this provider in the hallway.  His gait was mildly unsteady compared the baseline per wife.  MRI ordered however patient has a spinal cord stimulator and it is not compatible for MRI.  I consulted neurology Dr. 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  Pt is ambulatory in the hallway. Outpatient follow-up with primary care physician is recommended.  I advised patient and family to follow-up with his primary care physician for further evaluation and management, return to the ER if new or worsening symptoms.  Disposition Continued outpatient therapy. Follow-up with PCP recommended for reevaluation of symptoms. Treatment plan discussed with patient.  Pt acknowledged understanding was agreeable to the plan. Worrisome signs and symptoms were discussed with patient, and patient acknowledged understanding to return to the ED if they noticed these signs and symptoms. Patient was stable upon discharge.   This chart was dictated using voice recognition software.  Despite best efforts to proofread,  errors can occur  which can change the documentation meaning.          Rex Kras, PA 09/11/22 1228    Hayden Rasmussen, MD 09/12/22 1045

## 2022-09-11 ENCOUNTER — Telehealth: Payer: Self-pay

## 2022-09-11 NOTE — Transitions of Care (Post Inpatient/ED Visit) (Signed)
   09/11/2022  Name: [name] MRN: [id number] DOB: 04-29-1946  Today's TOC FU Call Status: Today's TOC FU Call Status:: Successful TOC FU Call Competed TOC FU Call Complete Date: 09/11/22  Transition Care Management Follow-up Telephone Call Date of Discharge: 09/09/22 Discharge Facility: [facility]) Type of Discharge: Emergency Department Reason for ED Visit: Other: ("AMS") How have you been since you were released from the hospital?: Better (Patient states that he is "doing better-but still having some little headaches at times-taking Tylenol." He has some soreness to his back area at times.) Any questions or concerns?: No  Items Reviewed: Did you receive and understand the discharge instructions provided?: Yes Medications obtained and verified?: No (patient states no changes to meds during ED visit-did not wish to review meds) Any new allergies since your discharge?: No Dietary orders reviewed?: NA Do you have support at home?: Yes People in Home: spouse Name of Support/Comfort Primary Source: [facility] and Equipment/Supplies: [place] Ordered?: NA Any new equipment or medical supplies ordered?: NA  Functional Questionnaire: Do you need assistance with bathing/showering or dressing?: No Do you need assistance with meal preparation?: No Do you need assistance with eating?: No Do you have difficulty maintaining continence: No Do you need assistance with getting out of bed/getting out of a chair/moving?: No Do you have difficulty managing or taking your medications?: No  Follow up appointments reviewed: PCP Follow-up appointment confirmed?: Yes Date of PCP follow-up appointment?: 09/12/22 Follow-up Provider: Dr. [facility] Follow-up appointment confirmed?: NA Do you need transportation to your follow-up appointment?: No Do you understand care options if your condition(s) worsen?: Yes-patient verbalized  understanding  SDOH Interventions Today    Flowsheet Row Most Recent Value  SDOH Interventions   Food Insecurity Interventions Intervention Not Indicated  Transportation Interventions Intervention Not Indicated      TOC Interventions Today    Flowsheet Row Most Recent Value  TOC Interventions   TOC Interventions Discussed/Reviewed TOC Interventions Discussed      Interventions Today    Flowsheet Row Most Recent Value  General Interventions   General Interventions Discussed/Reviewed General Interventions Discussed, Doctor Visits  Doctor Visits Discussed/Reviewed Doctor Visits Discussed, PCP  PCP/Specialist Visits Compliance with follow-up visit  Education Interventions   Education Provided Provided Education  Provided Verbal Education On Nutrition, Medication, When to see the doctor  Nutrition Interventions   Nutrition Discussed/Reviewed Nutrition Discussed  Pharmacy Interventions   Pharmacy Dicussed/Reviewed Pharmacy Topics Discussed, Medications and their functions  Safety Interventions   Safety Discussed/Reviewed Safety Discussed, Fall Risk, Home Safety       Hetty Blend [facility] Care Management Care Management Community Coordinator Direct Phone: [phone] Toll Free: [phone] Fax: [phone]

## 2022-09-12 ENCOUNTER — Encounter: Payer: Self-pay | Admitting: Family Medicine

## 2022-09-12 ENCOUNTER — Ambulatory Visit (INDEPENDENT_AMBULATORY_CARE_PROVIDER_SITE_OTHER): Payer: Medicare Other | Admitting: Family Medicine

## 2022-09-12 VITALS — BP 108/70 | HR 82 | Temp 97.5°F | Resp 20 | Ht 65.25 in | Wt 156.6 lb

## 2022-09-12 DIAGNOSIS — N3941 Urge incontinence: Secondary | ICD-10-CM | POA: Diagnosis not present

## 2022-09-12 DIAGNOSIS — R269 Unspecified abnormalities of gait and mobility: Secondary | ICD-10-CM

## 2022-09-12 DIAGNOSIS — R410 Disorientation, unspecified: Secondary | ICD-10-CM | POA: Diagnosis not present

## 2022-09-12 NOTE — Progress Notes (Signed)
   Subjective:    Patient ID: [name], male    DOB: [birth year], 77 y.o.   MRN: [id number]  HPI He is here for recheck after recently being seen in the emergency room.  He did have URI symptoms prior to then having difficulty with being confused.  His wife found him in the bathroom on the floor and took him to the hospital.  The emergency room record was reviewed and the evaluation including blood work and scans was negative.  He is here today for follow-up.  His wife has noted that he is less confused today.  When asked further questions he then discussed the fact that he is having urge incontinence symptoms but only occasionally has been unable to make it to the bathroom.  He and his wife both have been commented on the fact that they have noted a change in his gait as he seems to lean forward and walk with a shuffling type gait and this has been apparently getting worse over the last several months.   Review of Systems     Objective:   Physical Exam Alert and in no distress.  TMs normal.  Neck heart and lungs normal.       Assessment & Plan:  Urge incontinence  Gait disturbance  Confusion I discussed the urge incontinence and the possibility of treating this with medications versus using pads.  He will keep me informed concerning this and might need referral to urology if he continues have difficulty.  He and his wife are comfortable with that. We then discussed the gait disturbance and the fact that this could be a Parkinson's-like issue that could also be causing some of his confusion.  I will make a referral to neurology.

## 2022-09-14 NOTE — Progress Notes (Signed)
Patient ID: [name], male    DOB: 29-Mar-1946, 77 y.o.   MRN: [id number]  HPI M never smoker followed for asthma, allergic rhinitis complicated by hx ADHD, CAD/ MI, GERD, anemia    --------------------------------------------------------------------------------  08/10/19- 65 yo M never smoker followed for asthma, allergic rhinitis complicated by hx ADHD, CAD/ MI, GERD, anemia, back pain/spinal stimulator Anoro, ProAir, Singulair, zyrtec -----f/u Mild intermittent asthma without complication. Patient stated he has a cough .  Occasional Anoro and rescue inhaler. Unclear about purpose and hasn't noted that inhaler affect his breathing or  Persistent dry cough. No ACEI.[name]  Two friends have had pulmonary fibrosis, so he implies concern.  Mucinex not helpful. Admits much reflux in the past. Takes pantoprozole. Denies obvious aspiration event.  SOB climbing hills. Sometimes breathing feels raspy, but little phlegm.  Variable postnasal drip and throat clearing.   09/16/22- 76 yo M never smoker followed for asthma, allergic rhinitis complicated by hx ADHD, CAD/ MI, GERD, anemia, back pain/spinal stimulator Anoro, ProAir, Singulair, zyrtec, Concerta ER 54, xanax 0.25 mg bid prn,  ED 09/10/22- fall and altered mental status. Concussion. Coming to re-establish : c/o Hoarseness and not sleeping well Wife is here Complains of hoarseness, cannot saying.  Swallows without problem.  Little postnasal drip.  No dysphagia.  No heartburn recognized. During sleep wife says he talks and laughs.  Loud snoring.  He thinks he is dreaming more in the past month. Has spinal stimulator for back pain.  Takes lorazepam for sleep and if needed for itching. CXR 09/09/22- IMPRESSION: No active cardiopulmonary disease.  Borderline cardiomegaly. HRCT 08/20/19- IMPRESSION: 1. No evidence of interstitial lung disease. 2. Aortic atherosclerosis (ICD10-I70.0). Coronary artery calcification.   Review of Systems-see HPI  + = positive Constitutional:   No-   weight loss, night sweats, fevers, chills, fatigue, lassitude. HEENT:   No-current  headaches, difficulty swallowing, tooth/dental problems, sore throat,       No-  sneezing, itching, ear ache, no-nasal congestion, post nasal drip,  CV:  No-chest pain, No- orthopnea, PND, swelling in lower extremities, anasarca, dizziness, palpitations Resp: +   shortness of breath with exertion or at rest.              No-   productive cough,   +non-productive cough,  No- coughing up of blood.              No-   change in color of mucus.  No- wheezing.   Skin: No-   rash or lesions. GI:  No-   heartburn, indigestion, abdominal pain, nausea, vomiting,  GU:  MS:  No-   joint pain or swelling. Neuro-     nothing unusual Psych:  No- change in mood or affect. No depression or anxiety.  No memory loss.     Objective:   Physical Exam General- Alert, Oriented, Affect-appropriate, Distress- none acute Skin- rash-none, lesions- none, excoriation- none Lymphadenopathy- none Head- atraumatic            Eyes- Gross vision intact, PERRLA, conjunctivae clear secretions            Ears- Hearing, canals-normal            Nose- Clear, no-Septal dev, mucus, polyps, erosion, perforation             Throat- Mallampati II , mucosa clear, drainage- none, tonsils- atrophic. +weak voice Neck- flexible , trachea midline, +no stridor , thyroid nl, carotid no bruit Chest - symmetrical excursion ,  unlabored           Heart/CV- RRR , no murmur , no gallop  , no rub, nl s1 s2                           - JVD- none , edema- none, stasis changes- none, varices- none           Lung-+ clear , good airflow,wheeze- none, cough- none , dullness-none, rub- none           Chest wall- + spinal stimulator Abd- Br/ Gen/ Rectal- Not done, not indicated Extrem- Normal apparent strength and mobility Neuro- grossly intact to observation

## 2022-09-16 ENCOUNTER — Ambulatory Visit (INDEPENDENT_AMBULATORY_CARE_PROVIDER_SITE_OTHER): Payer: Medicare Other | Admitting: Internal Medicine

## 2022-09-16 ENCOUNTER — Encounter: Payer: Self-pay | Admitting: Internal Medicine

## 2022-09-16 VITALS — BP 108/64 | HR 75 | Wt 156.8 lb

## 2022-09-16 DIAGNOSIS — R0683 Snoring: Secondary | ICD-10-CM

## 2022-09-16 DIAGNOSIS — R490 Dysphonia: Secondary | ICD-10-CM

## 2022-09-16 NOTE — Patient Instructions (Addendum)
Order- refer to ENT   dx hoarseness  Order- schedule split night sleep study   dx snoring, REM Behavior Disorder  Ok to tke your lorazepam the night of your sleep test.

## 2022-09-19 ENCOUNTER — Telehealth: Payer: Self-pay

## 2022-09-19 ENCOUNTER — Encounter: Payer: Self-pay | Admitting: Neurology

## 2022-09-19 NOTE — Telephone Encounter (Signed)
error 

## 2022-10-02 ENCOUNTER — Encounter (HOSPITAL_BASED_OUTPATIENT_CLINIC_OR_DEPARTMENT_OTHER): Payer: Medicare Other | Admitting: Internal Medicine

## 2022-10-10 ENCOUNTER — Telehealth: Payer: Self-pay | Admitting: Family Medicine

## 2022-10-10 ENCOUNTER — Other Ambulatory Visit (HOSPITAL_COMMUNITY): Payer: Self-pay

## 2022-10-10 DIAGNOSIS — F909 Attention-deficit hyperactivity disorder, unspecified type: Secondary | ICD-10-CM

## 2022-10-10 MED ORDER — METHYLPHENIDATE HCL ER (OSM) 54 MG PO TBCR
54.0000 mg | EXTENDED_RELEASE_TABLET | ORAL | 0 refills | Status: DC
Start: 2022-10-10 — End: 2022-11-28
  Filled 2022-10-10: qty 30, 30d supply, fill #0

## 2022-10-10 NOTE — Telephone Encounter (Signed)
Pt called and states that he hasn't been taking his concerta because he hasn't been able to find it. He states that he really has been able to tell the difference upon pfm recommendation he found at [facility] out pt pharmacy. Please refill of concerta 54 mg to [facility] out pt pharmacy. Pt can be reached at [phone].

## 2022-10-11 ENCOUNTER — Other Ambulatory Visit (HOSPITAL_COMMUNITY): Payer: Self-pay

## 2022-10-17 ENCOUNTER — Telehealth: Payer: Self-pay | Admitting: Family Medicine

## 2022-10-17 ENCOUNTER — Other Ambulatory Visit: Payer: Self-pay

## 2022-10-17 MED ORDER — MECLIZINE HCL 25 MG PO TABS: ORAL_TABLET | ORAL | 0 refills | Status: DC

## 2022-10-17 NOTE — Telephone Encounter (Signed)
[name] requesting a refill on meclizine to [id number] - [place], [place] - [address]

## 2022-10-18 ENCOUNTER — Encounter: Payer: Self-pay | Admitting: Internal Medicine

## 2022-10-18 DIAGNOSIS — R0683 Snoring: Secondary | ICD-10-CM | POA: Insufficient documentation

## 2022-10-18 NOTE — Assessment & Plan Note (Signed)
Snoring, nonrefreshing sleep and wife's description of active dreaming with some punching and kicking which suggests REM Behavior Disorder.  Appropriate discussion. Plan-schedule sleep study.  Prefer in center study for documentation of inappropriate activity during dreaming.

## 2022-10-18 NOTE — Assessment & Plan Note (Signed)
He does not recognize either reflux or postnasal drainage. Plan-refer to ENT for evaluation of hoarseness

## 2022-10-21 ENCOUNTER — Ambulatory Visit (HOSPITAL_BASED_OUTPATIENT_CLINIC_OR_DEPARTMENT_OTHER): Payer: Medicare Other | Attending: Internal Medicine | Admitting: Internal Medicine

## 2022-10-21 VITALS — Ht 66.0 in | Wt 165.0 lb

## 2022-10-21 DIAGNOSIS — R0683 Snoring: Secondary | ICD-10-CM | POA: Diagnosis not present

## 2022-10-21 DIAGNOSIS — G4733 Obstructive sleep apnea (adult) (pediatric): Secondary | ICD-10-CM | POA: Insufficient documentation

## 2022-10-27 DIAGNOSIS — R0683 Snoring: Secondary | ICD-10-CM

## 2022-10-27 NOTE — Procedures (Signed)
    Patient Name: [name], [name] Date: 10/21/2022 Gender: Male D.O.B: [birth year] Age (years): 37 Referring Provider: Jetty Duhamel MD, ABSM Height (inches): 66 Interpreting Physician: Jetty Duhamel MD, ABSM Weight (lbs): 165 RPSGT: Ulyess Mort BMI: 27 MRN: [id number] Neck Size: 15.50  CLINICAL INFORMATION Sleep Study Type: NPSG Indication for sleep study: COPD, Fatigue, Hypertension, Snoring Epworth Sleepiness Score: 5  SLEEP STUDY TECHNIQUE As per the AASM Manual for the Scoring of Sleep and Associated Events v2.3 (April 2016) with a hypopnea requiring 4% desaturations.  The channels recorded and monitored were frontal, central and occipital EEG, electrooculogram (EOG), submentalis EMG (chin), nasal and oral airflow, thoracic and abdominal wall motion, anterior tibialis EMG, snore microphone, electrocardiogram, and pulse oximetry.  MEDICATIONS Medications self-administered by patient taken the night of the study : FERROUS SULFATE, MULTIPLE VITAMINS-MINERALS, OS-CAL  SLEEP ARCHITECTURE The study was initiated at 10:52:20 PM and ended at 4:50:32 AM.  Sleep onset time was 122.0 minutes and the sleep efficiency was 33.2%. The total sleep time was 119 minutes.  Stage REM latency was N/A minutes.  The patient spent 72.7% of the night in stage N1 sleep, 27.3% in stage N2 sleep, 0.0% in stage N3 and 0% in REM.  Alpha intrusion was absent.  Supine sleep was 19.33%.  RESPIRATORY PARAMETERS The overall apnea/hypopnea index (AHI) was 28.7 per hour. There were 5 total apneas, including 2 obstructive, 1 central and 2 mixed apneas. There were 52 hypopneas and 110 RERAs.  The AHI during Stage REM sleep was N/A per hour.  AHI while supine was 20.9 per hour.  The mean oxygen saturation was 93.1%. The minimum SpO2 during sleep was 89.0%.  soft snoring was noted during this study.  CARDIAC DATA The 2 lead EKG demonstrated sinus rhythm. The mean heart rate was 67.3 beats  per minute. Other EKG findings include: PVCs.  LEG MOVEMENT DATA The total PLMS were 0 with a resulting PLMS index of 0.0. Associated arousal with leg movement index was 3.5 .  IMPRESSIONS - Moderate obstructive sleep apnea occurred during this study (AHI = 28.7/h). - Patient had difficulty initiating sleep,with sleep onset around 01:00AM. Insufficient early sleep and events to meet protocol requirement for split CPAP titration.  - No significant central sleep apnea occurred during this study (CAI = 0.5/h). - The patient had minimal or no oxygen desaturation during the study (Min O2 = 89.0%, Mean 93.1%) - The patient snored with soft snoring volume. - EKG findings include PVCs. - Clinically significant periodic limb movements did not occur during sleep. No significant associated arousals. - Sleep architecture was markedly fragmented with frequent arousals. No significant parasomnia noted.  DIAGNOSIS - Obstructive Sleep Apnea (G47.33)  RECOMMENDATIONS - Suggest CPAP titration sleep study or autopap. Other options would be based on clinical judgment. - Consider if intervention for insomnia or previously described parasomnia would be helpful. - Sleep hygiene should be reviewed to assess factors that may improve sleep quality. - Weight management and regular exercise should be initiated or continued if appropriate.  [Electronically signed] 10/27/2022 12:21 PM  Jetty Duhamel MD, ABSM Diplomate, American Board of Sleep Medicine NPI: [id number]                         Jetty Duhamel Diplomate, American Board of Sleep Medicine  ELECTRONICALLY SIGNED ON:  10/27/2022, 12:11 PM [facility] PH: [phone]   FX: [phone] ACCREDITED BY THE AMERICAN ACADEMY OF SLEEP MEDICINE

## 2022-10-31 ENCOUNTER — Other Ambulatory Visit: Payer: Self-pay | Admitting: Family Medicine

## 2022-10-31 DIAGNOSIS — K219 Gastro-esophageal reflux disease without esophagitis: Secondary | ICD-10-CM

## 2022-11-01 NOTE — Progress Notes (Unsigned)
Assessment/Plan:   1.  Vascular Parkinsonism  -I had a long discussion with the patient and family.  I told the patient that I do not see evidence of idiopathic parkinsons disease but I do think that it is possible that the patient has vascular parkinsonism.  This is evidenced by the fact that the patient looks pretty good clinically when sitting down but looks more parkinsonian when ambulating, with short shuffling steps.  I talked to the patient about the difference between idiopathic Parkinson's disease and vascular parkinsonism.  I told the patient that carbidopa/levodopa only works about 30% of the time in patients with vascular parkinsonism, and in those patients that it does work, it usually only works for a few years.  The patient would like to try the medication.  We will slowly work up to carbidopa/levodopa 25/100, 1 tablet 3 times per day at 8 AM/noon/4 PM.  -Refer to physical therapy  2.  Chronic vertigo  -This has been going on for many years  -on meclizine  3.  Memory change  -suspect due to meds  -doubt neurodegenerative  Subjective:   [name] was seen today in the movement disorders clinic for neurologic consultation at the request of Ronnald Nian, MD.  The consultation is for the evaluation of shuffling gait and to rule out Parkinson's disease.  Medical records made available to me are reviewed.  Pt with wife who supplements hx.  Patient was in the emergency room on April 1 with mental status change.  Patient had apparently had a URI and had been taking Mucinex and had some degree of confusion with the URI/medication.  Patient had gotten in the shower and his wife went into check on him 30 minutes later and found that he had fallen.  There was apparently no syncope and patient did not hit his head.  Patient went to the hospital and was found to be hypokalemic at 2.3.  Patient did not have MRI because of spinal cord stimulator.  Patient followed up with primary  care physician, at which point they noted that patient had been having a chronic change in gait with forward leaning trunk and gait for several months (perhaps longer per pt/wife)  He was sent here for further evaluation.   Specific Symptoms:  Tremor: occasionally with use of the hands (both hands, mild) Family hx of similar:  Yes.   mother had Parkinsons Disease but was also on chronic reglan Voice: intermittent hoarseness - affects his singing Sleep:   Vivid Dreams:  No.  Acting out dreams:  some talking and rarely will fall OOB Wet Pillows: No. Postural symptoms:  Yes.    Falls?  States that he will trip going up stairs about 50% of the time.  He doesn't have as much trouble on flat ground Bradykinesia symptoms: shuffling gait, slow movements, and difficulty getting out of a chair Loss of smell:  Yes.   Loss of taste:  No. Urinary Incontinence:  No. Difficulty Swallowing:  rarely Handwriting, micrographia: No., just sloppier Trouble with ADL's:  No.  Trouble buttoning clothing: its slower but "I can do it." Depression:  No. Memory changes:  minimal, but wife expressed some concerns Hallucinations:  No.  visual distortions: No. N/V:  No. Lightheaded:  Yes.  , if "I first get up or turn my head or car next to me turns in traffic, I get vertigo."  Takes chronic meclizine  Syncope: No. Diplopia:  No. Prior exposure to reglan/antipsychotics: No.  Patient's last CT brain was September 10, 2022.  This was nonacute.  Patient does have spinal cord stimulator and it is unclear if this is MRI compatible.  PREVIOUS MEDICATIONS: none to date  ALLERGIES:  No Active Allergies  CURRENT MEDICATIONS:  Current Meds  Medication Sig   acetaminophen (TYLENOL) 500 MG tablet Take 1,000 mg by mouth every 6 (six) hours as needed.   ALPRAZolam (XANAX) 0.25 MG tablet TAKE 1 TABLET BY MOUTH 2 TIMES DAILY AS NEEDED.   azelastine (ASTELIN) 0.1 % nasal spray Place 2 sprays into both nostrils 2 (two) times  daily. Use in each nostril as directed   buPROPion (WELLBUTRIN XL) 300 MG 24 hr tablet TAKE 1 TABLET BY MOUTH  DAILY   calcium carbonate (OS-CAL) 600 MG TABS tablet Take 600 mg by mouth daily with breakfast.   cetirizine (ZYRTEC) 10 MG tablet Take 10 mg by mouth daily.   clopidogrel (PLAVIX) 75 MG tablet TAKE 1 TABLET BY MOUTH  DAILY   desvenlafaxine (PRISTIQ) 50 MG 24 hr tablet TAKE 1 TABLET BY MOUTH  DAILY   diltiazem (CARDIZEM CD) 240 MG 24 hr capsule Take 1 capsule (240 mg total) by mouth daily.   ferrous sulfate 325 (65 FE) MG tablet Take 325 mg by mouth once a week.   Flaxseed, Linseed, (FLAX SEED OIL) 1000 MG CAPS Take 1,000 mg by mouth every 7 (seven) days.   GLUCOSAMINE CHONDROITIN COMPLX PO Take 1 tablet by mouth daily.   guaiFENesin (MUCINEX) 600 MG 12 hr tablet Take 600 mg by mouth 2 (two) times daily as needed for cough.   meclizine (ANTIVERT) 25 MG tablet 1 twice a day as needed for dizziness   methylphenidate (CONCERTA) 54 MG PO CR tablet Take 1 tablet (54 mg total) by mouth every morning.   Multiple Vitamins-Minerals (MULTIVITAMIN WITH MINERALS) tablet Take 1 tablet by mouth daily.   pantoprazole (PROTONIX) 40 MG tablet TAKE 1 TABLET BY MOUTH EVERY DAY   PROAIR HFA 108 (90 Base) MCG/ACT inhaler TAKE 2 PUFFS BY MOUTH EVERY 4 HOURS AS NEEDED   sucralfate (CARAFATE) 1 GM/10ML suspension Take 10 mLs (1 g total) by mouth daily. (Patient taking differently: Take 1 g by mouth daily as needed (Constipation).)   umeclidinium-vilanterol (ANORO ELLIPTA) 62.5-25 MCG/INH AEPB INHALE 1 PUFF BY MOUTH EVERY DAY     Objective:   VITALS:   Vitals:   11/06/22 1016  BP: 134/82  Pulse: 61  SpO2: 99%  Weight: 157 lb 4.8 oz (71.4 kg)  Height: 5\' 3"  (1.6 m)    GEN:  The patient appears stated age and is in NAD. HEENT:  Normocephalic, atraumatic.  The mucous membranes are moist. The superficial temporal arteries are without ropiness or tenderness. CV:  RRR Lungs:  CTAB Neck/HEME:  There  are no carotid bruits bilaterally.  Neurological examination:  Orientation: The patient is alert and oriented x3.  History is accurate and detailed. Cranial nerves: There is good facial symmetry. Extraocular muscles are intact. The visual fields are full to confrontational testing. The speech is fluent and clear. Soft palate rises symmetrically and there is no tongue deviation. Hearing is intact to conversational tone. Sensation: Sensation is intact to light and pinprick throughout (facial, trunk, extremities). Vibration is intact at the bilateral big toe. There is no extinction with double simultaneous stimulation. There is no sensory dermatomal level identified. Motor: Strength is 5/5 in the bilateral upper and lower extremities.   Shoulder shrug is equal and symmetric.  There  is no pronator drift.  There are no fasciculations noted.   Deep tendon reflexes: Deep tendon reflexes are 2/4 at the bilateral biceps, triceps, brachioradialis, left patella, absent at the right patella and bilateral achilles. Plantar responses are downgoing bilaterally.  Movement examination: Tone: There is nl tone in the bilateral upper extremities.  The tone in the lower extremities is nl.  Abnormal movements: none even with distraction procedures Coordination:  There is no decremation with RAM's, with any form of RAMS, including alternating supination and pronation of the forearm, hand opening and closing, finger taps, heel taps and toe taps.  Gait and Station: The patient has no difficulty arising out of a deep-seated chair without the use of the hands. The patient's stride length is decreased and he slightly drags the R leg.  He shuffles the more he ambulates.  He is forward flexed.   I have reviewed and interpreted the following labs independently   Chemistry      Component Value Date/Time   NA 138 09/09/2022 2041   NA 142 07/09/2022 1230   K 3.2 (L) 09/09/2022 2041   CL 104 09/09/2022 2041   CO2 23 09/09/2022  2041   BUN 7 (L) 09/09/2022 2041   BUN 11 07/09/2022 1230   CREATININE 0.90 09/09/2022 2041   CREATININE 1.07 10/15/2016 1056      Component Value Date/Time   CALCIUM 9.0 09/09/2022 2041   ALKPHOS 51 09/09/2022 2041   AST 22 09/09/2022 2041   ALT 24 09/09/2022 2041   BILITOT 0.4 09/09/2022 2041   BILITOT 0.3 07/09/2022 1230      Lab Results  Component Value Date   TSH 1.010 05/19/2012   Lab Results  Component Value Date   WBC 10.8 (H) 09/09/2022   HGB 13.6 09/09/2022   HCT 41.4 09/09/2022   MCV 85.7 09/09/2022   PLT 189 09/09/2022     Total time spent on today's visit was 60 minutes, including both face-to-face time and nonface-to-face time.  Time included that spent on review of records (prior notes available to me/labs/imaging if pertinent), discussing treatment and goals, answering patient's questions and coordinating care.  Cc:  Ronnald Nian, MD

## 2022-11-06 ENCOUNTER — Ambulatory Visit (INDEPENDENT_AMBULATORY_CARE_PROVIDER_SITE_OTHER): Payer: Medicare Other | Admitting: Neurology

## 2022-11-06 ENCOUNTER — Encounter: Payer: Self-pay | Admitting: Neurology

## 2022-11-06 VITALS — BP 134/82 | HR 61 | Ht 63.0 in | Wt 157.3 lb

## 2022-11-06 DIAGNOSIS — G214 Vascular parkinsonism: Secondary | ICD-10-CM

## 2022-11-06 MED ORDER — CARBIDOPA-LEVODOPA 25-100 MG PO TABS: 1.0000 | ORAL_TABLET | Freq: Three times a day (TID) | ORAL | 1 refills | Status: DC

## 2022-11-06 NOTE — Patient Instructions (Signed)
Start Carbidopa Levodopa as follows:  Take 1/2 tablet three times daily, at least 30 minutes before meals (approximately 8am/noon/4pm), for one week  Then take 1/2 tablet in the morning, 1/2 tablet in the afternoon, 1 tablet in the evening, at least 30 minutes before meals, for one week  Then take 1/2 tablet in the morning, 1 tablet in the afternoon, 1 tablet in the evening, at least 30 minutes before meals, for one week  Then take 1 tablet three times daily at 8am/noon/4pm, at least 30 minutes before meals   As a reminder, carbidopa/levodopa can be taken at the same time as a carbohydrate, but we like to have you take your pill either 30 minutes before a protein source or 1 hour after as protein can interfere with carbidopa/levodopa absorption.    

## 2022-11-12 ENCOUNTER — Encounter: Payer: Self-pay | Admitting: Family Medicine

## 2022-11-12 ENCOUNTER — Telehealth (INDEPENDENT_AMBULATORY_CARE_PROVIDER_SITE_OTHER): Payer: Medicare Other | Admitting: Family Medicine

## 2022-11-12 DIAGNOSIS — G20A1 Parkinson's disease without dyskinesia, without mention of fluctuations: Secondary | ICD-10-CM

## 2022-11-12 DIAGNOSIS — G4733 Obstructive sleep apnea (adult) (pediatric): Secondary | ICD-10-CM

## 2022-11-12 DIAGNOSIS — E782 Mixed hyperlipidemia: Secondary | ICD-10-CM

## 2022-11-12 DIAGNOSIS — G214 Vascular parkinsonism: Secondary | ICD-10-CM | POA: Insufficient documentation

## 2022-11-12 HISTORY — DX: Obstructive sleep apnea (adult) (pediatric): G47.33

## 2022-11-12 HISTORY — DX: Parkinson's disease without dyskinesia, without mention of fluctuations: G20.A1

## 2022-11-12 HISTORY — DX: Vascular parkinsonism: G21.4

## 2022-11-12 NOTE — Progress Notes (Signed)
   Subjective:    Patient ID: [name], male    DOB: 02/14/1946, 77 y.o.   MRN: [id number]  HPI Documentation for virtual audio and video telecommunications through Caregility encounter: The patient was located at home. 2 patient identifiers used.  The provider was located in the office. The patient did consent to this visit and is aware of possible charges through their insurance for this visit. The other persons participating in this telemedicine service were none. Time spent on call was 5 minutes and in review of previous records >30 minutes total for counseling and coordination of care.  This virtual service is not related to other E/M service within previous 7 days.  He had a sleep study done in mid May which did show evidence of OSA with AHI of 28.7.  I discussed this in detail with him.  Explained that we will need to set him up on a CPAP titration, get him on an appliance that he can get accustomed to as he does admit to having some difficulty with claustrophobia.  Explained that we will need to get a readout on that to make sure it is working and that he is getting a benefit from it. He was also seen by Dr. Arbutus Leas for evaluation of his Parkinson's disease and is now started on medications for that and will be slowly titrated up on the medication.  He is to be seen again by her in about 6 months. He then complained of continued difficulty with weakness and was told by cardiology to stop the Lipitor thinking that that medicine could be causing trouble however he states that it has had no effect on changing his symptoms.  Review of Systems     Objective:   Physical Exam Alert and in no distress otherwise not examined       Assessment & Plan:  OSA (obstructive sleep apnea)       He will be set up for CPAP auto titrate and then I will get a copy of how well he is doing so I can document appropriately the benefit from this.  Explained sleep hygiene to him in detail concerning  caffeine, decongestants, regular exercise.  He will keep me informed concerning this. Parkinson's disease, unspecified whether dyskinesia present,  unspecified whether manifestations fluctuate        Explained that some of his weakness could easily be from the Parkinson's disease and he will slowly increase his medications and hopefully this plus the OSA can help with his fatigue. Mixed hyperlipidemia        Since stopping the atorvastatin has had no effect on his weakness, I recommend that he start taking this again.

## 2022-11-14 ENCOUNTER — Telehealth: Payer: Self-pay | Admitting: Cardiology

## 2022-11-14 NOTE — Telephone Encounter (Signed)
*  STAT* If patient is at the pharmacy, call can be transferred to refill team.   1. Which medications need to be refilled? (please list name of each medication and dose if known) pantoprazole (PROTONIX) 40 MG tablet  2. Which pharmacy/location (including street and city if local pharmacy) is medication to be sent to? [id number] - [place], [place] - [address]  3. Do they need a 30 day or 90 day supply?  90 day supply  Patient states he is almost out of medication.

## 2022-11-14 NOTE — Telephone Encounter (Signed)
Advised that this medication we did not prescribe. Pt verbalized understanding and had no additional questions.

## 2022-11-15 ENCOUNTER — Ambulatory Visit: Payer: Medicare Other | Attending: Neurology | Admitting: Physical Therapy

## 2022-11-15 DIAGNOSIS — R2689 Other abnormalities of gait and mobility: Secondary | ICD-10-CM

## 2022-11-15 DIAGNOSIS — R2681 Unsteadiness on feet: Secondary | ICD-10-CM | POA: Diagnosis present

## 2022-11-15 DIAGNOSIS — R293 Abnormal posture: Secondary | ICD-10-CM

## 2022-11-15 DIAGNOSIS — G214 Vascular parkinsonism: Secondary | ICD-10-CM | POA: Insufficient documentation

## 2022-11-15 DIAGNOSIS — R41841 Cognitive communication deficit: Secondary | ICD-10-CM | POA: Diagnosis not present

## 2022-11-15 DIAGNOSIS — R471 Dysarthria and anarthria: Secondary | ICD-10-CM | POA: Diagnosis present

## 2022-11-15 DIAGNOSIS — R296 Repeated falls: Secondary | ICD-10-CM | POA: Diagnosis present

## 2022-11-15 NOTE — Therapy (Signed)
OUTPATIENT PHYSICAL THERAPY NEURO EVALUATION   Patient Name: MICHIO THIER MRN: [id number] DOB:1946/01/03, 77 y.o., male Today's Date: 11/15/2022   PCP: Ronnald Nian, MD  REFERRING PROVIDER: Vladimir Faster, DO  END OF SESSION:  PT End of Session - 11/15/22 1115     Visit Number 1    Number of Visits 17   Plus eval   Date for PT Re-Evaluation 01/10/23    Authorization Type [employer]    Progress Note Due on Visit 10    PT Start Time 1112   Pt arrived late   PT Stop Time 1147    PT Time Calculation (min) 35 min    Equipment Utilized During Treatment Gait belt    Activity Tolerance Patient tolerated treatment well    Behavior During Therapy WFL for tasks assessed/performed             Past Medical History:  Diagnosis Date   ADD (attention deficit disorder)    Allergic rhinitis    skin test 02/23/08 vaccine 2009; allergy shots weekly through Dr. Maple Hudson   Anemia    takes Fe -    Anginal pain (HCC)    Ankylosing spondylitis (HCC)    Anxiety    sees therapist Vernell Leep   Arthritis    Asthma    Dr. 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 Donnie Aho   CHEILECTOMY  10/16/2011   Procedure: CHEILECTOMY;  Surgeon: Sherri Rad, MD;  Location: [facility];  Service: Orthopedics;  Laterality: Right;   CHOLECYSTECTOMY     COLONOSCOPY     CORONARY ANGIOPLASTY     1995 after MI   CORONARY ANGIOPLASTY WITH STENT PLACEMENT     1999    ESOPHAGOGASTRODUODENOSCOPY  02/18/2012   Procedure: ESOPHAGOGASTRODUODENOSCOPY (EGD);  Surgeon: Florencia Reasons, MD;  Location: [facility];  Service: Endoscopy;  Laterality: N/A;   FOOT SURGERY     joint scraped, arthritis right foot   HERNIA REPAIR     LUMBAR DISC SURGERY     x2   right index finger  mass removed- arthritis     Dr. Amanda Pea   [facility]  Right 05/18/2021   medial toe consistent with callus   SKIN BIOPSY Left 05/17/2021   actinic keratosis   SPINAL CORD STIMULATOR BATTERY EXCHANGE N/A 10/11/2021   Procedure: SPINAL CORD STIMULATOR BATTERY EXCHANGE;  Surgeon: Venita Lick, MD;  Location: [facility];  Service: Orthopedics;  Laterality: N/A;  60 mins   SPINAL CORD STIMULATOR INSERTION N/A 11/21/2016   Procedure: LUMBAR SPINAL CORD STIMULATOR INSERTION;  Surgeon: Venita Lick, MD;  Location: [facility];  Service: Orthopedics;  Laterality: N/A;  Requests 2.5 hrs   TONSILLECTOMY     1953   UPPER GI ENDOSCOPY  [id number]   VASECTOMY     1980   Patient Active Problem List   Diagnosis Date Noted   OSA (obstructive sleep apnea) 11/12/2022   Parkinson's disease 11/12/2022   Snoring 10/18/2022   Peripheral vascular disease (HCC) 12/25/2021   Dizziness 12/24/2021   Leg pain, left 11/14/2021   Chronic low back pain with sciatica 11/14/2021   Spinal cord stimulator status 11/14/2021   Muscle spasm 11/14/2021   Constipation 09/11/2021   Slow transit constipation 09/11/2021   Cough 09/11/2021   Early satiety 09/11/2021   History of malignant neoplasm of colon  09/11/2021   Hoarseness 09/11/2021   Iron deficiency 09/11/2021   Personal history of colonic polyps 09/11/2021   Weight decreased 09/11/2021   Esophageal motility disorder 09/11/2021   Gastritis 09/11/2021   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   Pain in both feet 08/10/2021   Diverticulitis 02/26/2021   Atherosclerosis of aorta (HCC) 08/30/2020   Mild concentric left ventricular hypertrophy (LVH)    Hypogonadism male    History of kidney stones    Erectile dysfunction    COPD (chronic obstructive pulmonary disease) (HCC)    Acquired trigger finger of left middle finger 04/22/2019   Acquired trigger finger of right middle finger 04/22/2019   Left ventricular dysfunction 10/12/2018   Ankylosing spondylitis (HCC) 10/09/2018   Presbycusis of both ears 04/23/2018   Mild intermittent asthma without complication 04/23/2018   Headache 01/30/2017   Chronic pain 11/21/2016   Arthritis 10/15/2016   Spondylolisthesis of lumbar region 02/28/2015   Hyperlipidemia    Lumbar disc disease    Hypertension    CAD (coronary artery disease) 05/19/2012   GERD (gastroesophageal reflux disease)    Depression with anxiety    Chronic asthma 05/17/2011   Allergic rhinitis due to pollen 08/08/2010   History of benign positional vertigo    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 Pt perseverating on how the medicine Dr. Arbutus Leas gave him is the same as the one  Dr. Dareen Piano gave him for his "itchy feet" and is making him drowsy. Has had several falls this past year, all in forward direction. Trips over his R foot the most. Can usually get up on his own but needs help occasionally. Does not want to use AD as he does not think he needs it.   Pt accompanied by:  Wife, [name]  PERTINENT HISTORY: Pt falls frequently, always in forward direction and insists on using back stairs despite frequent falls over top step. Started Sinemet week of 11/04/22  PAIN:  Are you having pain? Yes: NPRS scale: 3/10 Pain location: Low back  Pain description: "it just hurts"  PRECAUTIONS: Fall  WEIGHT BEARING RESTRICTIONS: No  FALLS: Has patient fallen in last 6 months? Yes. Number of falls "more than I can count"  LIVING ENVIRONMENT: Lives with: lives with their spouse Lives in: House/apartment Stairs: Yes: External: 3 in front and 5 in back steps; on right going up, on left going up, can reach both, and always uses back steps (where he has the most falls) Has following equipment at home: Single point cane  PLOF: Independent  PATIENT GOALS: "I want to be in the next Olympics" "I would like to walk with minimal pain and get up and down with no assistance"  OBJECTIVE:   COGNITION: Overall cognitive status: Impaired   SENSATION: Denies numbness/tingling in BUE/BLEs  COORDINATION: Heel to shin test: WNL bilaterally   EDEMA: Frequent swelling in R ankle    POSTURE: rounded shoulders, forward head, and left pelvic obliquity  LOWER EXTREMITY MMT:  Tested in seated position   MMT Right Eval Left Eval  Hip flexion 4+ 5  Hip extension    Hip abduction 4 4  Hip adduction 5 5  Hip internal rotation    Hip external rotation    Knee flexion 5 5  Knee extension 5 5  Ankle dorsiflexion 3 5  Ankle plantarflexion    Ankle inversion    Ankle eversion    (Blank rows = not tested)  BED MOBILITY:  Typically slides out of the bed due to not backing up far  enough prior to sitting on bed   TRANSFERS: Assistive device utilized: None  Sit to stand: Min A and Mod A Stand to sit: Min A Pt relies heavily on BUE support w/very minimal anterior lean. On attempt to stand without UE support, required mod A to prevent posterior fall into chair. Pt unable to obtain full hip/knee extension in stance and has poor eccentric control    GAIT: Gait pattern: step through pattern, decreased arm swing- Right, decreased arm swing- Left, decreased step length- Right, decreased step length- Left, decreased stride length, decreased hip/knee flexion- Right, decreased hip/knee flexion- Left, decreased ankle dorsiflexion- Right, decreased ankle dorsiflexion- Left, shuffling, festinating, trunk flexed, narrow BOS, poor foot clearance- Right, and poor foot clearance- Left Distance walked: Various clinic distances  Assistive device utilized: None Level of assistance: Min A Comments: Pt demonstrates foot drop of RLE and slides R foot forward. Min tactile cues provided to anterior shoulder to correct anterior lean and reduce festination. Pt reports he tends to "run" forward w/walking.    FUNCTIONAL TESTS:   [facility] PT Assessment - 11/15/22 1137       Transfers   Five time sit to stand comments  15.41s  w/BUE support, unbale to reach full extension. Significant retropulsion w/o UE support requiring mod A to prevent flipping chair over     Ambulation/Gait   Gait velocity 32.8' over 16.75s = 1.68ft/s no AD   min A due to festination              TODAY'S TREATMENT:           Next Session                                                                                                                       PATIENT EDUCATION: Education details: POC, eval findings Person educated: Patient and Spouse Education method: Explanation Education comprehension: verbalized understanding and needs further education  HOME EXERCISE PROGRAM: To be established   GOALS: Goals  reviewed with patient? Yes  SHORT TERM GOALS: Target date: 12/13/2022    Pt will be independent with initial HEP for improved strength, balance, transfers and gait.  Baseline: not established on eval  Goal status: INITIAL  2.  Pt will improve gait velocity to at least 2.33ft/s w/LRAD and CGA for improved gait efficiency and reduced fall risk   Baseline: 1.95 ft/s w/o AD and min A Goal status: INITIAL  3.  Pt will perform single sit to stand without UE support w/CGA and no retropulsion for improved safety w/transfers and body mechanics   Baseline: mod A due to significant retropulsion  Goal status: INITIAL  4.  TUG to be assessed and STG/LTG written  Baseline:  Goal status: INITIAL  5.  Pt and wife will verbalize fall prevention techniques for home and community setting for reduced fall frequency and improved safety.  Baseline: Pt falling multiple times per week Goal status: INITIAL  6.  Pt will trial various assistive devices in clinic to determine safest option for mobility  Baseline: not using AD - has SPC Goal status: INITIAL  LONG TERM GOALS: Target date: 01/10/2023    Pt will be independent with final HEP for improved strength, balance, transfers and gait.  Baseline:  Goal status: INITIAL  2.  Pt will improve gait velocity to at least 2.6 ft/s w/LRAD and SBA for improved gait efficiency and independence   Baseline: 1.95 ft/s w/o AD and min A Goal status: INITIAL  3.  TUG goal  Baseline:  Goal status: INITIAL  4.  Pt will improve 5 x STS to less than or equal to 15 seconds w/good posture and form to demonstrate improved functional strength and transfer efficiency.   Baseline: 15.41s w/BUE support, significant posterior lean and flexed posture  Goal status: INITIAL  5.  Pt will trial various bracing options on RLE to reduce foot slap and fall frequency.  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 Pt's PMH is significant for: CAD, anxiety, insomnia, depression, GERD, hypertension, lumbar disc disease with spinal cord stimulator, COPD, asthma. The following  deficits were present during the exam: decreased strength, forward flexed posture, absent safety awareness, foot slap of RLE, postural deficits and impaired balance. Based on fall history, impulsiveness and decreased safety awareness, pt is a high fall risk. Pt would benefit from skilled PT to address these impairments and functional limitations to maximize functional mobility independence.    OBJECTIVE IMPAIRMENTS: Abnormal gait, decreased balance, decreased cognition, decreased coordination, decreased knowledge of condition, decreased knowledge of use of DME, decreased mobility, difficulty walking, decreased strength, decreased safety awareness, impaired perceived functional ability, improper body mechanics, postural dysfunction, and pain  ACTIVITY LIMITATIONS: carrying, lifting, bending, standing, squatting, stairs, transfers, bed mobility, reach over head, locomotion level, and caring for others  PARTICIPATION LIMITATIONS: medication management, interpersonal relationship, driving, shopping, community activity, and yard work  PERSONAL FACTORS: Age, Behavior pattern, Fitness, Past/current experiences, and 1 comorbidity: Vascular parkinsonism  are also affecting patient's functional outcome.   REHAB POTENTIAL: Fair due to diagnosis   CLINICAL DECISION MAKING: Evolving/moderate complexity  EVALUATION COMPLEXITY: Moderate  PLAN:  PT FREQUENCY: 2x/week  PT DURATION: 8 weeks  PLANNED INTERVENTIONS: Therapeutic exercises, Therapeutic activity, Neuromuscular re-education, Balance training, Gait training, Patient/Family education, Self Care, Joint mobilization, Stair training, Vestibular training, Canalith repositioning, Orthotic/Fit training, DME instructions, Manual therapy, and  Re-evaluation  PLAN FOR NEXT SESSION: TUG and update goal. Trial various AD (cane vs rollator vs RW vs U-step). Establish HEP for sit to stands, increased step length, posture. Trial braces on RLE   Jill Alexanders Armend Hochstatter, PT, DPT 11/15/2022, 12:28 PM

## 2022-11-18 ENCOUNTER — Encounter: Payer: Self-pay | Admitting: Physical Therapy

## 2022-11-18 ENCOUNTER — Ambulatory Visit: Payer: Medicare Other | Admitting: Physical Therapy

## 2022-11-18 DIAGNOSIS — R293 Abnormal posture: Secondary | ICD-10-CM

## 2022-11-18 DIAGNOSIS — R296 Repeated falls: Secondary | ICD-10-CM

## 2022-11-18 DIAGNOSIS — R2681 Unsteadiness on feet: Secondary | ICD-10-CM

## 2022-11-18 DIAGNOSIS — R2689 Other abnormalities of gait and mobility: Secondary | ICD-10-CM

## 2022-11-18 NOTE — Therapy (Signed)
OUTPATIENT PHYSICAL THERAPY NEURO TREATMENT   Patient Name: [name] MRN: [id number] DOB:07-23-1945, 77 y.o., male Today's Date: 11/18/2022   PCP: Dwayne Nian, MD  REFERRING PROVIDER: Vladimir Faster, DO  END OF SESSION:  PT End of Session - 11/18/22 1535     Visit Number 2    Number of Visits 17   Plus eval   Date for PT Re-Evaluation 01/10/23    Authorization Type [employer]    Progress Note Due on Visit 10    PT Start Time 1533    PT Stop Time 1615    PT Time Calculation (min) 42 min    Equipment Utilized During Treatment Gait belt    Activity Tolerance Patient tolerated treatment well    Behavior During Therapy WFL for tasks assessed/performed             Past Medical History:  Diagnosis Date   ADD (attention deficit disorder)    Allergic rhinitis    skin test 02/23/08 vaccine 2009; allergy shots weekly through Dr. Maple Patterson   Anemia    takes Fe -    Anginal pain (HCC)    Ankylosing spondylitis (HCC)    Anxiety    sees therapist [name]   Arthritis    Asthma    Dr. 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 Donnie Patterson   CHEILECTOMY  10/16/2011   Procedure: CHEILECTOMY;  Surgeon: Dwayne Rad, MD;  Location: [facility];  Service: Orthopedics;  Laterality: Right;   CHOLECYSTECTOMY     COLONOSCOPY     CORONARY ANGIOPLASTY     1995 after MI   CORONARY ANGIOPLASTY WITH STENT PLACEMENT     1999    ESOPHAGOGASTRODUODENOSCOPY  02/18/2012   Procedure: ESOPHAGOGASTRODUODENOSCOPY (EGD);  Surgeon: Dwayne Reasons, MD;  Location: [facility];  Service: Endoscopy;  Laterality: N/A;   FOOT SURGERY     joint scraped, arthritis right foot   HERNIA REPAIR     LUMBAR DISC SURGERY     x2   right index finger  mass removed- arthritis     Dr. Amanda Patterson   [facility]  Right 05/18/2021   medial toe consistent with callus   SKIN BIOPSY Left 05/17/2021   actinic keratosis   SPINAL CORD STIMULATOR BATTERY EXCHANGE N/A 10/11/2021   Procedure: SPINAL CORD STIMULATOR BATTERY EXCHANGE;  Surgeon: Dwayne Lick,  MD;  Location: [facility];  Service: Orthopedics;  Laterality: N/A;  60 mins   SPINAL CORD STIMULATOR INSERTION N/A 11/21/2016   Procedure: LUMBAR SPINAL CORD STIMULATOR INSERTION;  Surgeon: Dwayne Lick, MD;  Location: [facility];  Service: Orthopedics;  Laterality: N/A;  Requests 2.5 hrs   TONSILLECTOMY     1953   UPPER GI ENDOSCOPY  [id number]   VASECTOMY     1980   Patient Active Problem List   Diagnosis Date Noted   OSA (obstructive sleep apnea) 11/12/2022   Parkinson's disease 11/12/2022   Snoring 10/18/2022   Peripheral vascular disease (HCC) 12/25/2021   Dizziness 12/24/2021   Leg pain, left 11/14/2021   Chronic low back pain with sciatica 11/14/2021   Spinal cord stimulator status 11/14/2021   Muscle spasm 11/14/2021   Constipation 09/11/2021   Slow transit constipation 09/11/2021   Cough 09/11/2021   Early satiety 09/11/2021   History of malignant neoplasm of colon 09/11/2021    Hoarseness 09/11/2021   Iron deficiency 09/11/2021   Personal history of colonic polyps 09/11/2021   Weight decreased 09/11/2021   Esophageal motility disorder 09/11/2021   Gastritis 09/11/2021   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   Pain in both feet 08/10/2021   Diverticulitis 02/26/2021   Atherosclerosis of aorta (HCC) 08/30/2020   Mild concentric left ventricular hypertrophy (LVH)    Hypogonadism male    History of kidney stones    Erectile dysfunction    COPD (chronic obstructive pulmonary disease) (HCC)    Acquired trigger finger of left middle finger 04/22/2019   Acquired trigger finger of right middle finger 04/22/2019   Left ventricular dysfunction 10/12/2018   Ankylosing spondylitis (HCC) 10/09/2018   Presbycusis of both ears 04/23/2018   Mild intermittent asthma without complication 04/23/2018   Headache 01/30/2017   Chronic pain 11/21/2016   Arthritis 10/15/2016   Spondylolisthesis of lumbar region 02/28/2015   Hyperlipidemia    Lumbar disc disease    Hypertension    CAD (coronary artery disease) 05/19/2012   GERD (gastroesophageal reflux disease)    Depression with anxiety    Chronic asthma 05/17/2011   Allergic rhinitis due to pollen 08/08/2010   History of benign positional vertigo    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 Had an ok weekend. Has been taking his Carbidopa - Levodopa.   Pt accompanied by:  Wife,  [name]  PERTINENT HISTORY: Pt falls frequently, always in forward direction and insists on using back stairs despite frequent falls over top step. Started Sinemet week of 11/04/22  PAIN:  Are you having pain? No  PRECAUTIONS: Fall  WEIGHT BEARING RESTRICTIONS: No  FALLS: Has patient fallen in last 6 months? Yes. Number of falls "more than I can count"  LIVING ENVIRONMENT: Lives with: lives with their spouse Lives in: House/apartment Stairs: Yes: External: 3 in front and 5 in back steps; on right going up, on left going up, can reach both, and always uses back steps (where he has the most falls) Has following equipment at home: Single point cane  PLOF: Independent  PATIENT GOALS: "I want to be in the next Olympics" "I would like to walk with minimal pain and get up and down with no assistance"  OBJECTIVE:   COGNITION: Overall cognitive status: Impaired   SENSATION: Denies numbness/tingling in BUE/BLEs  COORDINATION: Heel to shin test: WNL bilaterally   EDEMA: Frequent swelling in R ankle    POSTURE: rounded shoulders, forward head, and left pelvic obliquity  LOWER EXTREMITY MMT:  Tested in seated position   MMT Right Eval Left Eval  Hip flexion 4+ 5  Hip extension    Hip abduction 4 4  Hip adduction 5 5  Hip internal rotation    Hip external rotation    Knee flexion 5 5  Knee extension 5 5  Ankle dorsiflexion 3 5  Ankle plantarflexion    Ankle inversion    Ankle eversion    (Blank rows = not tested)   TODAY'S TREATMENT:    TRANSFERS: Assistive device utilized: None  Sit to stand: Min A  Stand to sit: Min A Pt relies heavily on BUE support w/very minimal anterior lean. Pt with BLE bracing against mat table and more narrow BOS.   GAIT: Gait pattern: step through pattern, decreased arm swing- Right, decreased arm swing- Left, decreased step length- Right, decreased step length- Left, decreased stride length, decreased hip/knee flexion- Right, decreased  hip/knee flexion- Left, decreased ankle dorsiflexion- Right, decreased ankle dorsiflexion- Left, shuffling, festinating, trunk flexed, narrow BOS, poor foot clearance- Right, and poor foot clearance- Left Distance walked: 230' x 2 with RW and rollator, 115 ' x 1 with U-Step Rollator Assistive device utilized: None, RW, rollator, U-Step Rollator  Level of assistance: Min A with no AD, CGA with remainder of AD  Comments: With no AD, pt with incr forward flexed posture and ambulating on toes with decr foot clearance of RLE. Pt needing min A for balance with incr gait speed and festination.      At end of session with no AD, cued for posture and step length/height and slowed pace. Cued to pretend like he is in a marching band for improved foot clearance (this worked better for patient). Pt able to slow down his pace and only needed CGA for gait             [facility] PT Assessment - 11/18/22 1546       Standardized Balance Assessment   Standardized Balance Assessment Timed Up and Go Test      Timed Up and Go Test   Normal TUG (seconds) 20   no AD            PATIENT  EDUCATION: Education details: Investment banker, operational with different devices, pros/cons of each walker and purpose of AD to decr fall risk (pt initially hesitant), educated that pt would be safest at this time using a 2 wheeled RW  to decr fall risk and improve gait mechanics and that we can work on going through Community education officer for a [employer], but that will take some time. Effects of Parkinsonism on gait and needing to think about larger movements/more effort for more normal movement patterns.  Person educated: Patient and Spouse Education method: Explanation Education comprehension: verbalized understanding and needs further education  HOME EXERCISE PROGRAM: To be established   GOALS: Goals reviewed with patient? Yes  SHORT TERM GOALS: Target date: 12/13/2022   Pt will be independent with initial HEP for improved strength, balance,  transfers and gait.  Baseline: not established on eval  Goal status: INITIAL  2.  Pt will improve gait velocity to at least 2.95ft/s w/LRAD and CGA for improved gait efficiency and reduced fall risk   Baseline: 1.95 ft/s w/o AD and min A Goal status: INITIAL  3.  Pt will perform single sit to stand without UE support w/CGA and no retropulsion for improved safety w/transfers and body mechanics   Baseline: mod A due to significant retropulsion  Goal status: INITIAL  4. Pt will improve TUG time to 17 seconds or less with LRAD in order to demo decrease fall risk. Baseline: 20 seconds no AD Goal status: INITIAL  5.  Pt and wife will verbalize fall prevention techniques for home and community setting for reduced fall frequency and improved safety.  Baseline: Pt falling multiple times per week Goal status: INITIAL  6.  Pt will trial various assistive devices in clinic to determine safest option for mobility  Baseline: not using AD - has SPC Goal status: INITIAL    LONG TERM GOALS: Target date: 01/10/2023  Pt will be independent with final HEP for improved strength, balance, transfers and gait.  Baseline:  Goal status: INITIAL  2.  Pt will improve gait velocity to at least 2.6 ft/s w/LRAD and SBA for improved gait efficiency and independence   Baseline: 1.95 ft/s w/o AD and min A Goal status: INITIAL  3.  Pt will improve TUG time to 15 seconds or less with LRAD and supervision  in order to demo decrease fall risk. Baseline: 20 seconds with no AD, CGA Goal status: INITIAL  4.  Pt will improve 5 x STS to less than or equal to 15 seconds w/good posture and form to demonstrate improved functional strength and transfer efficiency.   Baseline: 15.41s w/BUE support, significant posterior lean and flexed posture  Goal status: INITIAL  5.  Pt will trial various bracing options on RLE to reduce foot slap and fall frequency.  Baseline:  Goal status: INITIAL   ASSESSMENT:  CLINICAL  IMPRESSION: Patient is a 77 year old male referred to Neuro OPPT for vascular parkinsonism. Pt's PMH is significant for: CAD, anxiety, insomnia, depression, GERD, hypertension, lumbar disc disease with spinal cord stimulator, COPD, asthma. The following deficits were present during the exam: decreased strength, forward flexed posture, absent safety awareness, foot slap of RLE, postural deficits and impaired balance. Based on fall history, impulsiveness and decreased safety awareness, pt is a high fall risk. Pt would benefit from skilled PT to address these impairments and functional limitations to maximize functional mobility independence.    OBJECTIVE IMPAIRMENTS: Abnormal gait, decreased balance, decreased cognition, decreased coordination, decreased knowledge of condition, decreased knowledge  of use of DME, decreased mobility, difficulty walking, decreased strength, decreased safety awareness, impaired perceived functional ability, improper body mechanics, postural dysfunction, and pain  ACTIVITY LIMITATIONS: carrying, lifting, bending, standing, squatting, stairs, transfers, bed mobility, reach over head, locomotion level, and caring for others  PARTICIPATION LIMITATIONS: medication management, interpersonal relationship, driving, shopping, community activity, and yard work  PERSONAL FACTORS: Age, Behavior pattern, Fitness, Past/current experiences, and 1 comorbidity: Vascular parkinsonism  are also affecting patient's functional outcome.   REHAB POTENTIAL: Fair due to diagnosis   CLINICAL DECISION MAKING: Evolving/moderate complexity  EVALUATION COMPLEXITY: Moderate  PLAN:  PT FREQUENCY: 2x/week  PT DURATION: 8 weeks  PLANNED INTERVENTIONS: Therapeutic exercises, Therapeutic activity, Neuromuscular re-education, Balance training, Gait training, Patient/Family education, Self Care, Joint mobilization, Stair training, Vestibular training, Canalith repositioning, Orthotic/Fit training, DME  instructions, Manual therapy, and Re-evaluation  PLAN FOR NEXT SESSION: practice with U-Step rollator. Did pt find RW?  Establish HEP for sit to stands, increased step length, posture. Try some seated PWR moves?  Trial braces on RLE   Drake Leach, PT, DPT 11/18/2022, 4:24 PM

## 2022-11-20 ENCOUNTER — Encounter: Payer: Self-pay | Admitting: Physical Therapy

## 2022-11-20 ENCOUNTER — Other Ambulatory Visit: Payer: Self-pay | Admitting: Family Medicine

## 2022-11-20 ENCOUNTER — Ambulatory Visit: Payer: Medicare Other | Admitting: Physical Therapy

## 2022-11-20 DIAGNOSIS — R2689 Other abnormalities of gait and mobility: Secondary | ICD-10-CM

## 2022-11-20 DIAGNOSIS — R2681 Unsteadiness on feet: Secondary | ICD-10-CM

## 2022-11-20 DIAGNOSIS — R293 Abnormal posture: Secondary | ICD-10-CM | POA: Diagnosis not present

## 2022-11-20 DIAGNOSIS — R296 Repeated falls: Secondary | ICD-10-CM

## 2022-11-20 NOTE — Therapy (Signed)
OUTPATIENT PHYSICAL THERAPY NEURO TREATMENT   Patient Name: [name] MRN: [id number] DOB:1945/07/06, 77 y.o., male Today's Date: 11/20/2022   PCP: Ronnald Nian, MD  REFERRING PROVIDER: Vladimir Faster, DO  END OF SESSION:  PT End of Session - 11/20/22 0807     Visit Number 3    Number of Visits 17   Plus eval   Date for PT Re-Evaluation 01/10/23    Authorization Type [employer] Medicare    Progress Note Due on Visit 10    PT Start Time 0807   pt late to session and needing to use restroom   PT Stop Time 0845    PT Time Calculation (min) 38 min    Equipment Utilized During Treatment Gait belt    Activity Tolerance Patient tolerated treatment well    Behavior During Therapy WFL for tasks assessed/performed             Past Medical History:  Diagnosis Date   ADD (attention deficit disorder)    Allergic rhinitis    skin test 02/23/08 vaccine 2009; allergy shots weekly through Dr. Maple Hudson   Anemia    takes Fe -    Anginal pain (HCC)    Ankylosing spondylitis (HCC)    Anxiety    sees therapist Vernell Leep   Arthritis    Asthma    Dr. 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 Donnie Aho   CHEILECTOMY  10/16/2011   Procedure: CHEILECTOMY;  Surgeon: Sherri Rad, MD;  Location: [facility];  Service: Orthopedics;  Laterality: Right;   CHOLECYSTECTOMY     COLONOSCOPY     CORONARY ANGIOPLASTY     1995 after MI   CORONARY ANGIOPLASTY WITH STENT PLACEMENT     1999    ESOPHAGOGASTRODUODENOSCOPY  02/18/2012   Procedure: ESOPHAGOGASTRODUODENOSCOPY (EGD);  Surgeon: Florencia Reasons, MD;  Location: [facility];  Service: Endoscopy;  Laterality: N/A;   FOOT SURGERY     joint scraped, arthritis right foot   HERNIA REPAIR     LUMBAR DISC SURGERY     x2   right index finger  mass removed- arthritis     Dr. Amanda Pea   [facility]  Right 05/18/2021   medial toe consistent with callus   SKIN BIOPSY Left 05/17/2021   actinic keratosis   SPINAL CORD STIMULATOR BATTERY EXCHANGE N/A 10/11/2021  Procedure: SPINAL CORD STIMULATOR BATTERY EXCHANGE;  Surgeon: Venita Lick, MD;  Location: [facility];  Service: Orthopedics;  Laterality: N/A;  60 mins   SPINAL CORD STIMULATOR INSERTION N/A 11/21/2016   Procedure: LUMBAR SPINAL CORD STIMULATOR INSERTION;  Surgeon: Venita Lick, MD;  Location: [facility] OR;  Service: Orthopedics;  Laterality: N/A;  Requests 2.5 hrs   TONSILLECTOMY     1953   UPPER GI ENDOSCOPY  [id number]   VASECTOMY     1980   Patient Active Problem List   Diagnosis Date Noted   OSA (obstructive sleep apnea) 11/12/2022   Parkinson's disease 11/12/2022   Snoring 10/18/2022   Peripheral vascular disease (HCC) 12/25/2021   Dizziness 12/24/2021   Leg pain, left 11/14/2021   Chronic low back pain with sciatica 11/14/2021   Spinal cord stimulator status 11/14/2021   Muscle spasm 11/14/2021   Constipation 09/11/2021   Slow transit constipation 09/11/2021   Cough 09/11/2021   Early satiety 09/11/2021   History of  malignant neoplasm of colon 09/11/2021   Hoarseness 09/11/2021   Iron deficiency 09/11/2021   Personal history of colonic polyps 09/11/2021   Weight decreased 09/11/2021   Esophageal motility disorder 09/11/2021   Gastritis 09/11/2021   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   Pain in both feet 08/10/2021   Diverticulitis 02/26/2021   Atherosclerosis of aorta (HCC) 08/30/2020   Mild concentric left ventricular hypertrophy (LVH)    Hypogonadism male    History of kidney stones    Erectile dysfunction    COPD (chronic obstructive pulmonary disease) (HCC)    Acquired trigger finger of left middle finger 04/22/2019   Acquired trigger finger of right middle finger 04/22/2019   Left ventricular dysfunction 10/12/2018   Ankylosing spondylitis (HCC) 10/09/2018   Presbycusis of both ears 04/23/2018   Mild intermittent asthma without complication 04/23/2018   Headache 01/30/2017   Chronic pain 11/21/2016   Arthritis 10/15/2016   Spondylolisthesis of lumbar region 02/28/2015   Hyperlipidemia    Lumbar disc disease    Hypertension    CAD (coronary artery disease) 05/19/2012   GERD (gastroesophageal reflux disease)    Depression with anxiety    Chronic asthma 05/17/2011   Allergic rhinitis due to pollen 08/08/2010   History of benign positional vertigo    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 Reports feeling dizzy today and  having to take  a Meclizine. Reports hx of motion sickness where he was treated at this clinic. Had an almost fall - was looking for pants. Toes got caught up on a step, but did not fall.   Pt accompanied by:  Wife, [name]  PERTINENT HISTORY: Pt falls frequently, always in forward direction and insists on using back stairs despite frequent falls over top step. Started Sinemet week of 11/04/22  PAIN:  Are you having pain? No  PRECAUTIONS: Fall  WEIGHT BEARING RESTRICTIONS: No  FALLS: Has patient fallen in last 6 months? Yes. Number of falls "more than I can count"  LIVING ENVIRONMENT: Lives with: lives with their spouse Lives in: House/apartment Stairs: Yes: External: 3 in front and 5 in back steps; on right going up, on left going up, can reach both, and always uses back steps (where he has the most falls) Has following equipment at home: Single point cane  PLOF: Independent  PATIENT GOALS: "I want to be in the next Olympics" "I would like to walk with minimal pain and get up and down with no assistance"  OBJECTIVE:   COGNITION: Overall cognitive status: Impaired   SENSATION: Denies numbness/tingling in BUE/BLEs  COORDINATION: Heel to shin test: WNL bilaterally   EDEMA: Frequent swelling in R ankle    POSTURE: rounded shoulders, forward head, and left pelvic obliquity  LOWER EXTREMITY MMT:  Tested in seated position   MMT Right Eval Left Eval  Hip flexion 4+ 5  Hip extension    Hip abduction 4 4  Hip adduction 5 5  Hip internal rotation    Hip external rotation    Knee flexion 5 5  Knee extension 5 5  Ankle dorsiflexion 3 5  Ankle plantarflexion    Ankle inversion    Ankle eversion    (Blank rows = not tested)   TODAY'S TREATMENT:    TRANSFERS: Assistive device utilized: None  Sit to stand: SBA/CGA Stand to sit: SBA/CGA Pt initially relies heavily on BUE support w/very minimal anterior lean. Pt with BLE bracing against mat table and more narrow BOS.   Went over cues  to scoot out towards edge, widening BOS, tucking feet under, and big effort with forward lean. Cues for tall posture and looking ahead once in standing as pt tends to look down at floor and with incr forward flexed posture. Cued to hold for a few seconds. Performed 10 reps, then performed an additional 10 reps per pt request. Pt with no episodes of BLE bracing    GAIT: Gait pattern: step through pattern, decreased arm swing- Right, decreased arm swing- Left, decreased step length- Right, decreased step length- Left, decreased stride length, decreased hip/knee flexion- Right, decreased hip/knee flexion- Left, decreased ankle dorsiflexion- Right, decreased ankle dorsiflexion- Left, shuffling, festinating, trunk flexed, narrow BOS, poor foot clearance- Right, and poor foot clearance- Left Distance walked: 230' x 1 with RW, 230' x 1 with U-Step Rollator Assistive device utilized: Agricultural consultant  Level of assistance: Supervision  Comments: Pt brought in his RW from home - provided tennis balls for easier propulsion and adjusted to proper height   With RW, cues for posture/looking ahead and stride length/foot clearance. Cues throughout for incr effort with foot clearance, pt did well with these cues, but did need reminders throughout gait. Educated on importance of big movement patterns with gait with Parkinsonism  and purpose of thinking about moving big. Pt with no festination episodes with RW   Practiced again with U-Step rollator with pt needing reminders on how to use the brakes. Pt with more forward flexed posture and needs cues to stay close to it throughout with incr stride length/foot clearance. When pt conversing with therapist during gait with dual tasking, pt unable to maintain stride length and foot clearance and walks on his toes.   Discussed at this time RW seems to be the best and safe option with improved gait mechanics and pt already has one at home. Pt and pt's spouse in  agreement   NMR: Pt performs PWR! Moves in sitting position    PWR! Up for improved posture x20 reps, cues for scap retraction and looking ahead and holding for a few seconds   PWR! Step for improved step initiation x12 reps, cues for incr foot clearance and step height   Cues provided for technique, level of effort and purpose of each exercise. Pt initially reporting effort level as 2/10, with cues to work towards a 7/10. Also cued for counting out loud    PATIENT EDUCATION: Education details: Discussed pt safest to use RW at this time for gait for gait mechanics and to decr fall risk. Effects of Parkinsonism on gait and needing to think about larger movements/more effort for more normal movement patterns. Sit to stands and some seated PWR moves to HEP for larger movement patterns. Pt reporting having more of a hoarse voice and he is in choir at his church, discussed possibility of speech therapy to work on this and strategies for voice volume. Pt interested in this. PT to send referral request to Dr. Arbutus Leas   Person educated: Patient and Spouse Education method: Explanation, Demonstration, Verbal cues, and Handouts Education comprehension: verbalized understanding, returned demonstration, verbal cues required, and needs further education  HOME EXERCISE PROGRAM: Seated PWR Up/Step   Access Code: [email or web address] URL: [email or web address] Date: 11/20/2022 Prepared by: Sherlie Ban  Exercises - Sit to Stand with Armchair  - 1-2 x daily - 5 x weekly - 2 sets - 10 reps  GOALS: Goals reviewed with patient? Yes  SHORT TERM GOALS: Target date: 12/13/2022   Pt will be independent with initial HEP for improved strength, balance, transfers and gait.  Baseline: not established on eval  Goal status: INITIAL  2.  Pt will improve gait velocity to at least 2.26ft/s w/LRAD and CGA for improved gait efficiency and reduced fall risk   Baseline: 1.95 ft/s w/o AD and min A Goal status:  INITIAL  3.  Pt will perform single sit to stand without UE support w/CGA and no retropulsion for improved safety w/transfers and body mechanics   Baseline: mod A due to significant retropulsion  Goal status: INITIAL  4. Pt will improve TUG time to 17 seconds or less with LRAD in order to demo decrease fall risk. Baseline: 20 seconds no AD Goal status: INITIAL  5.  Pt and wife will verbalize fall prevention techniques for home and community setting for reduced fall frequency and improved safety.  Baseline: Pt falling multiple times per week Goal status: INITIAL  6.  Pt will trial various assistive devices in clinic to determine safest option for mobility  Baseline: not using AD - has SPC Goal status: INITIAL    LONG TERM GOALS: Target date: 01/10/2023  Pt will be independent with final HEP for improved strength, balance, transfers and gait.  Baseline:  Goal status: INITIAL  2.  Pt will improve gait velocity to at least 2.6 ft/s w/LRAD and SBA for improved gait efficiency and independence   Baseline: 1.95 ft/s w/o AD and min A Goal status: INITIAL  3.  Pt will improve TUG time to 15 seconds or less with LRAD and supervision  in order to demo decrease fall risk. Baseline: 20 seconds with no AD, CGA Goal status: INITIAL  4.  Pt will improve 5 x STS to less than or equal to 15 seconds w/good posture and form to demonstrate improved functional strength and transfer efficiency.   Baseline: 15.41s w/BUE support, significant posterior lean and flexed posture  Goal status: INITIAL  5.  Pt will trial various bracing options on RLE to reduce foot slap and fall frequency.  Baseline:  Goal status: INITIAL   ASSESSMENT:  CLINICAL IMPRESSION: Pt brought in his RW from home - provided tennis balls and adjusted to appropriate height for pt. Pt able to ambulate with supervision with RW with cued for posture, step length and foot clearance throughout. Determined this will be the best AD  for pt at this time to decr fall risk and improve gait mechanics. Remainder of session focused on initiating HEP for sit <> stands and seated PWR moves. With sit <> stands, with cues for proper set up and incr forward lean pt able to perform with supervision without BLE bracing against mat table. Does need continuous cues for posture/hip extension in standing as pt with tendency to look at the ground once in standing. Pt reports dizziness during session, but reports this is normal for him (has a longstanding hx of this) and takes Meclizine. But pt able to tolerate everything well. Will continue to progress towards LTGs.    OBJECTIVE IMPAIRMENTS: Abnormal gait, decreased balance, decreased cognition, decreased coordination, decreased knowledge of condition, decreased knowledge of use of DME, decreased mobility, difficulty walking, decreased strength, decreased safety awareness, impaired perceived functional ability, improper body mechanics, postural dysfunction, and pain  ACTIVITY LIMITATIONS: carrying, lifting, bending, standing, squatting, stairs, transfers, bed mobility, reach over head, locomotion level, and caring for others  PARTICIPATION LIMITATIONS: medication management, interpersonal relationship, driving, shopping, community activity, and yard work  PERSONAL FACTORS: Age, Behavior pattern, Fitness, Past/current experiences, and 1 comorbidity: Vascular parkinsonism  are also affecting patient's functional outcome.   REHAB POTENTIAL: Fair due to diagnosis   CLINICAL DECISION MAKING: Evolving/moderate complexity  EVALUATION COMPLEXITY: Moderate  PLAN:  PT FREQUENCY: 2x/week  PT DURATION: 8 weeks  PLANNED INTERVENTIONS: Therapeutic exercises, Therapeutic activity, Neuromuscular re-education, Balance training, Gait training, Patient/Family education, Self Care, Joint mobilization, Stair training, Vestibular training, Canalith repositioning, Orthotic/Fit training, DME instructions, Manual  therapy, and Re-evaluation  PLAN FOR NEXT SESSION: practice gait with RW. Add to HEP for increased step length, posture. Try some seated PWR moves?  Trial braces on RLE   Drake Leach, PT, DPT 11/20/2022, 8:49 AM

## 2022-11-20 NOTE — Patient Instructions (Signed)
Sit to Stand Transfers:  Scoot out to the edge of the chair Place your feet flat on the floor, shoulder width apart.  Make sure your feet are tucked just under your knees. Lean forward (nose over toes) with momentum, and stand up tall with your best posture.  If you need to use your arms, use them as a quick boost up to stand. If you are in a low or soft chair, you can lean back and then forward up to stand, in order to get more momentum. Once you are standing, make sure you are looking ahead and standing tall.  To sit down:  Back up until you feel the chair behind your legs. Bend at you hips, reaching  Back for you chair, if needed, then slowly squat to sit down on your chair. 

## 2022-11-22 ENCOUNTER — Telehealth: Payer: Self-pay | Admitting: Physical Therapy

## 2022-11-22 ENCOUNTER — Other Ambulatory Visit: Payer: Self-pay

## 2022-11-22 DIAGNOSIS — G214 Vascular parkinsonism: Secondary | ICD-10-CM

## 2022-11-22 DIAGNOSIS — R490 Dysphonia: Secondary | ICD-10-CM

## 2022-11-22 NOTE — Telephone Encounter (Signed)
Referral sent 

## 2022-11-22 NOTE — Telephone Encounter (Signed)
Dr. Arbutus Leas, Lance Morin  has been seen by PT for possible vascular Parkinsonism. The patient would benefit from a speech therapy evaluation due to hypophonia and hoarseness.   If you agree, please place an order in [facility] workque in [facility] or fax the order to [phone]. Please send to [address] location.    Thank you, Sherlie Ban, PT, DPT 11/22/22 9:55 AM    [facility] [address] [place], [place]  [address] Phone:  [phone] Fax:  [phone]

## 2022-11-26 ENCOUNTER — Ambulatory Visit: Payer: Medicare Other | Admitting: Physical Therapy

## 2022-11-27 ENCOUNTER — Other Ambulatory Visit: Payer: Self-pay | Admitting: Cardiology

## 2022-11-27 DIAGNOSIS — I2510 Atherosclerotic heart disease of native coronary artery without angina pectoris: Secondary | ICD-10-CM

## 2022-11-28 ENCOUNTER — Telehealth: Payer: Self-pay | Admitting: Family Medicine

## 2022-11-28 DIAGNOSIS — F909 Attention-deficit hyperactivity disorder, unspecified type: Secondary | ICD-10-CM

## 2022-11-28 MED ORDER — METHYLPHENIDATE HCL ER (OSM) 54 MG PO TBCR: 54.0000 mg | EXTENDED_RELEASE_TABLET | ORAL | 0 refills | Status: DC

## 2022-11-28 NOTE — Telephone Encounter (Signed)
Pt called and is requesting a refill on his concerta  Please send to the [id number] - [place], [place] - [address]

## 2022-11-29 ENCOUNTER — Other Ambulatory Visit: Payer: Self-pay

## 2022-11-29 ENCOUNTER — Ambulatory Visit: Payer: Medicare Other | Admitting: Physical Therapy

## 2022-11-29 ENCOUNTER — Ambulatory Visit: Payer: Medicare Other | Admitting: Speech Pathology

## 2022-11-29 ENCOUNTER — Encounter: Payer: Self-pay | Admitting: Speech Pathology

## 2022-11-29 VITALS — BP 112/70

## 2022-11-29 DIAGNOSIS — R471 Dysarthria and anarthria: Secondary | ICD-10-CM

## 2022-11-29 DIAGNOSIS — R2681 Unsteadiness on feet: Secondary | ICD-10-CM

## 2022-11-29 DIAGNOSIS — R2689 Other abnormalities of gait and mobility: Secondary | ICD-10-CM

## 2022-11-29 DIAGNOSIS — R293 Abnormal posture: Secondary | ICD-10-CM | POA: Diagnosis not present

## 2022-11-29 DIAGNOSIS — R296 Repeated falls: Secondary | ICD-10-CM

## 2022-11-29 NOTE — Patient Instructions (Signed)
[email or web address] --- watch this one hour presentation on how ST can help you with your voice/speech.

## 2022-11-29 NOTE — Therapy (Signed)
OUTPATIENT SPEECH LANGUAGE PATHOLOGY PARKINSON'S EVALUATION   Patient Name: [name] MRN: [id number] DOB:[birth year], 77 y.o., male Today's Date: 11/29/2022  PCP: Ronnald Nian, MD REFERRING PROVIDER: Vladimir Faster, DO  END OF SESSION:  End of Session - 11/29/22 1151     Visit Number 1    Number of Visits 13    Date for SLP Re-Evaluation 01/24/23    Authorization Type [employer]    Progress Note Due on Visit 10    SLP Start Time 1100    SLP Stop Time  1150    SLP Time Calculation (min) 50 min    Activity Tolerance Patient tolerated treatment well             Past Medical History:  Diagnosis Date   ADD (attention deficit disorder)    Allergic rhinitis    skin test 02/23/08 vaccine 2009; allergy shots weekly through Dr. Maple Hudson   Anemia    takes Fe -    Anginal pain (HCC)    Ankylosing spondylitis (HCC)    Anxiety    sees therapist Vernell Leep   Arthritis    Asthma    Dr. 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  Donnie Aho   CHEILECTOMY  10/16/2011   Procedure: CHEILECTOMY;  Surgeon: Sherri Rad, MD;  Location: [facility];  Service: Orthopedics;  Laterality: Right;   CHOLECYSTECTOMY     COLONOSCOPY     CORONARY ANGIOPLASTY     1995 after MI   CORONARY ANGIOPLASTY WITH STENT PLACEMENT     1999    ESOPHAGOGASTRODUODENOSCOPY  02/18/2012   Procedure: ESOPHAGOGASTRODUODENOSCOPY (EGD);  Surgeon: Florencia Reasons, MD;  Location: [facility];  Service: Endoscopy;  Laterality: N/A;   FOOT SURGERY     joint scraped, arthritis right foot   HERNIA REPAIR     LUMBAR DISC SURGERY     x2   right index finger  mass removed- arthritis     Dr. Amanda Pea   [facility]  Right 05/18/2021   medial toe consistent with callus   SKIN BIOPSY Left 05/17/2021   actinic keratosis   SPINAL CORD STIMULATOR BATTERY EXCHANGE N/A 10/11/2021   Procedure: SPINAL CORD STIMULATOR BATTERY EXCHANGE;  Surgeon: Venita Lick, MD;  Location: [facility] OR;  Service: Orthopedics;  Laterality: N/A;  60 mins   SPINAL CORD STIMULATOR INSERTION N/A 11/21/2016  Procedure: LUMBAR SPINAL CORD STIMULATOR INSERTION;  Surgeon: Venita Lick, MD;  Location: [facility] OR;  Service: Orthopedics;  Laterality: N/A;  Requests 2.5 hrs   TONSILLECTOMY     1953   UPPER GI ENDOSCOPY  [id number]   VASECTOMY     1980   Patient Active Problem List   Diagnosis Date Noted   OSA (obstructive sleep apnea) 11/12/2022   Parkinson's disease 11/12/2022   Snoring 10/18/2022   Peripheral vascular disease (HCC) 12/25/2021   Dizziness 12/24/2021   Leg pain, left 11/14/2021   Chronic low back pain with sciatica 11/14/2021   Spinal cord stimulator status 11/14/2021   Muscle spasm 11/14/2021   Constipation 09/11/2021   Slow transit constipation 09/11/2021   Cough 09/11/2021   Early satiety 09/11/2021   History of malignant neoplasm of colon 09/11/2021   Hoarseness 09/11/2021   Iron deficiency 09/11/2021   Personal history of colonic polyps 09/11/2021   Weight  decreased 09/11/2021   Esophageal motility disorder 09/11/2021   Gastritis 09/11/2021   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   Pain in both feet 08/10/2021   Diverticulitis 02/26/2021   Atherosclerosis of aorta (HCC) 08/30/2020   Mild concentric left ventricular hypertrophy (LVH)    Hypogonadism male    History of kidney stones    Erectile dysfunction    COPD (chronic obstructive pulmonary disease) (HCC)    Acquired trigger finger of left middle finger 04/22/2019   Acquired trigger finger of right middle finger 04/22/2019   Left ventricular dysfunction 10/12/2018   Ankylosing spondylitis (HCC) 10/09/2018   Presbycusis of both ears 04/23/2018   Mild intermittent asthma without complication 04/23/2018   Headache 01/30/2017   Chronic pain 11/21/2016   Arthritis 10/15/2016   Spondylolisthesis of lumbar region 02/28/2015   Hyperlipidemia    Lumbar disc disease    Hypertension    CAD (coronary artery disease) 05/19/2012   GERD (gastroesophageal reflux disease)    Depression with anxiety    Chronic asthma 05/17/2011   Allergic rhinitis due to pollen 08/08/2010   History of benign positional vertigo    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  Patient was in the emergency room on April 1 with mental status change.  Patient had apparently had a URI and had been taking Mucinex and had some degree of confusion with the URI/medication.  Patient had gotten in the shower and his wife went into check on him 30 minutes later and found that he had fallen.  There was apparently no  syncope and patient did not hit his head.  Patient went to the hospital and was found to be hypokalemic at 2.3.  Patient did not have MRI because of spinal cord stimulator.  Patient followed up with primary care physician, at which point they noted that patient had been having a chronic change in gait with forward leaning trunk and gait for several months (perhaps longer per pt/wife)   PAIN:  Are you having pain? No  FALLS: Has patient fallen in last 6 months?  Yes-Patient currently receiving PT as of 11/15/22.   LIVING ENVIRONMENT: Lives with: lives with their spouse Lives in: House/apartment  PLOF:  Level of assistance: Needed assistance with ADLs, Needed assistance with IADLS Employment: Retired  PATIENT GOALS: "get back to singing"  OBJECTIVE:   COGNITION: Overall cognitive status: Within functional limits for tasks assessed Comments: Not formally assessed, patient was able to follow simple and complex instructions.   MOTOR SPEECH: Overall motor speech: impaired Level of impairment: Word, Phrase, Sentence, and Conversation Respiration: diaphragmatic/abdominal breathing Phonation: hoarse, volume decay  Resonance:  In throat  Articulation: Appears intact Intelligibility: Intelligible Motor planning: Appears intact Effective technique: slow rate, increased vocal intensity, over articulate, and pause  ORAL MOTOR EXAMINATION: Overall status: WFL  OBJECTIVE VOICE ASSESSMENT: Sustained "ah" maximum phonation time: 78 seconds Sustained "ah" loudness average: 75-76 dB Conversational loudness average: 64 dB Conversational loudness range: 61-67 dB Voice quality: hoarse, rough, and low vocal intensity Stimulability trials: Given SLP modeling and usual mod cues, loudness average increased to 74 dB (range of 71 to 78) at sentence level.  Comments: Patient complains of hoarse voice, and spouse endorses low vocal intensity.   Completed audio recording of patients baseline voice  without cueing from SLP: No  Pt does not report difficulty with swallowing which does not warrant further evaluation.  PATIENT REPORTED OUTCOME MEASURES (PROM): Communication Participation Item Bank: Patient and spouse report decreased effective communication while traveling a car, having conversations with others at a distance, and when the environment is noisy. Baseline of assessment 21.5/32.   TODAY'S TREATMENT:                                                                                                                                         DATE:   11/29/22: Collaborated with patient and spouse to generate POC and goals. Initiated training in use of intentional voice to optimize vocal intensity. Use of verbal cues and models effective in increasing volume and resolving volume decay noted as baseline. Introduced speak out program to pt and spouse.   PATIENT EDUCATION: Education details: see above.  Person educated: Patient and Spouse Education method: Medical illustrator Education comprehension: verbalized understanding and returned demonstration  HOME EXERCISE PROGRAM: Speak Out!    GOALS: Goals reviewed with patient? Yes  SHORT TERM GOALS: Target date: 12/27/2022  Pt will complete HEP at least 1x/day given occasional min A over 2 sessions  Baseline: Goal status: INITIAL  2.  Pt will meet or exceed dB targets during reading exercises with 90% accuracy with rare min-A.  Baseline:  Goal status: INITIAL  3.  Pt will demonstrate SOVTE and vocal function exercises with mod-I.  Baseline:  Goal status: INITIAL  LONG TERM GOALS: Target date: 01/10/2023  1.  Pt and spouse will report reduced need to repeat information at home > 1 week.  Baseline: 21.5/32 Goal status: INITIAL  2.  Pt will utilize dysarthria compensations in 5-10 minute conversation and maintain WNL conversational volume (70-72 dB) given occasional min A over  2 sessions   Baseline:  Goal status:  INITIAL  3.  Pt will demonstrate intentional voice and flow phonation during singing exercise with min-A  Baseline:  Goal status: INITIAL   4.  Pt will report improvement via PROM by d/c   Baseline: 21.5  Goal status: INITIAL   ASSESSMENT:  CLINICAL IMPRESSION: Patient is a 77 y.o. M who was seen today for motor speech s/p hoarse voice and decreased volume. Patient was able to increase volume upon initiation of phrases but needed prompting to keep his volume up during long, unstructured conversation. Patient encouraged to use his "presenting" voice to speak up and out with intent. Evaluation reveals mild dysarthria. Pt's speech is c/b hoarse vocal quality, reduced vocal intensity, volume decay. Pt reports frustration with hoarse voice and states that he does not feel like he is speaking softy, however his wife endorsed improvement over Speak Out! [place]ils during todays session. Pt would benefit from skilled ST to address aforementioned deficits to enhance communication efficacy.    OBJECTIVE IMPAIRMENTS: Objective impairments include dysarthria. These impairments are limiting patient from effectively communicating at home and in community.Factors affecting potential to achieve goals and functional outcome are co-morbidities and medical prognosis. Patient will benefit from skilled SLP services to address above impairments and improve overall function.  REHAB POTENTIAL: Excellent  PLAN:  SLP FREQUENCY: 1-2x/week  SLP DURATION: 8 weeks   PLANNED INTERVENTIONS: Cueing hierachy, Oral motor exercises, SLP instruction and feedback, Compensatory strategies, and Patient/family education  Maia Breslow, CCC-SLP 11/29/2022, 12:19 PM

## 2022-11-29 NOTE — Therapy (Signed)
OUTPATIENT PHYSICAL THERAPY NEURO TREATMENT   Patient Name: [name] MRN: [id number] DOB:1945/10/05, 77 y.o., male Today's Date: 11/29/2022   PCP: Dwayne Nian, MD  REFERRING PROVIDER: Vladimir Faster, DO  END OF SESSION:  PT End of Session - 11/29/22 1019     Visit Number 4    Number of Visits 17   Plus eval   Date for PT Re-Evaluation 01/10/23    Authorization Type [employer] Medicare    Progress Note Due on Visit 10    PT Start Time 1016    PT Stop Time 1101    PT Time Calculation (min) 45 min    Equipment Utilized During Treatment Gait belt    Activity Tolerance Patient tolerated treatment well    Behavior During Therapy WFL for tasks assessed/performed              Past Medical History:  Diagnosis Date   ADD (attention deficit disorder)    Allergic rhinitis    skin test 02/23/08 vaccine 2009; allergy shots weekly through Dr. Maple Patterson   Anemia    takes Fe -    Anginal pain (HCC)    Ankylosing spondylitis (HCC)    Anxiety    sees therapist [name]   Arthritis    Asthma    Dr. 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 Donnie Patterson   CHEILECTOMY  10/16/2011   Procedure: CHEILECTOMY;  Surgeon: Dwayne Rad, MD;  Location: [facility];  Service: Orthopedics;  Laterality: Right;   CHOLECYSTECTOMY     COLONOSCOPY     CORONARY ANGIOPLASTY     1995 after MI   CORONARY ANGIOPLASTY WITH STENT PLACEMENT     1999    ESOPHAGOGASTRODUODENOSCOPY  02/18/2012   Procedure: ESOPHAGOGASTRODUODENOSCOPY (EGD);  Surgeon: Dwayne Reasons, MD;  Location: [facility];  Service: Endoscopy;  Laterality: N/A;   FOOT SURGERY     joint scraped, arthritis right foot   HERNIA REPAIR     LUMBAR DISC SURGERY     x2   right index finger  mass removed- arthritis     Dr. Amanda Patterson   [name]  Right 05/18/2021   medial toe consistent with callus   SKIN BIOPSY Left 05/17/2021   actinic keratosis   SPINAL CORD STIMULATOR BATTERY EXCHANGE N/A 10/11/2021   Procedure: SPINAL CORD STIMULATOR BATTERY EXCHANGE;  Surgeon: [name],  Dwayne Garret, MD;  Location: [facility];  Service: Orthopedics;  Laterality: N/A;  60 mins   SPINAL CORD STIMULATOR INSERTION N/A 11/21/2016   Procedure: LUMBAR SPINAL CORD STIMULATOR INSERTION;  Surgeon: Dwayne Lick, MD;  Location: [facility];  Service: Orthopedics;  Laterality: N/A;  Requests 2.5 hrs   TONSILLECTOMY     1953   UPPER GI ENDOSCOPY  [id number]   VASECTOMY     1980   Patient Active Problem List   Diagnosis Date Noted   OSA (obstructive sleep apnea) 11/12/2022   Parkinson's disease 11/12/2022   Snoring 10/18/2022   Peripheral vascular disease (HCC) 12/25/2021   Dizziness 12/24/2021   Leg pain, left 11/14/2021   Chronic low back pain with sciatica 11/14/2021   Spinal cord stimulator status 11/14/2021   Muscle spasm 11/14/2021   Constipation 09/11/2021   Slow transit constipation 09/11/2021   Cough 09/11/2021   Early satiety 09/11/2021   History of malignant neoplasm of colon 09/11/2021    Hoarseness 09/11/2021   Iron deficiency 09/11/2021   Personal history of colonic polyps 09/11/2021   Weight decreased 09/11/2021   Esophageal motility disorder 09/11/2021   Gastritis 09/11/2021   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   Pain in both feet 08/10/2021   Diverticulitis 02/26/2021   Atherosclerosis of aorta (HCC) 08/30/2020   Mild concentric left ventricular hypertrophy (LVH)    Hypogonadism male    History of kidney stones    Erectile dysfunction    COPD (chronic obstructive pulmonary disease) (HCC)    Acquired trigger finger of left middle finger 04/22/2019   Acquired trigger finger of right middle finger 04/22/2019   Left ventricular dysfunction 10/12/2018   Ankylosing spondylitis (HCC) 10/09/2018   Presbycusis of both ears 04/23/2018   Mild intermittent asthma without complication 04/23/2018   Headache 01/30/2017   Chronic pain 11/21/2016   Arthritis 10/15/2016   Spondylolisthesis of lumbar region 02/28/2015   Hyperlipidemia    Lumbar disc disease    Hypertension    CAD (coronary artery disease) 05/19/2012   GERD (gastroesophageal reflux disease)    Depression with anxiety    Chronic asthma 05/17/2011   Allergic rhinitis due to pollen 08/08/2010   History of benign positional vertigo    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 States he is dizzy today, took a meclizine in the lobby. Rating dizziness as 3/10 today. States he has had one  fall since last visit, was working at [employer] and tripped over a Mudlogger had to help him up.   Pt accompanied by:  Wife, [name]  PERTINENT HISTORY: Pt falls frequently, always in forward direction and insists on using back stairs despite frequent falls over top step. Started Sinemet week of 11/04/22  PAIN:  Are you having pain? No  PRECAUTIONS: Fall  WEIGHT BEARING RESTRICTIONS: No  FALLS: Has patient fallen in last 6 months? Yes. Number of falls "more than I can count"  LIVING ENVIRONMENT: Lives with: lives with their spouse Lives in: House/apartment Stairs: Yes: External: 3 in front and 5 in back steps; on right going up, on left going up, can reach both, and always uses back steps (where he has the most falls) Has following equipment at home: Single point cane  PLOF: Independent  PATIENT GOALS: "I want to be in the next Olympics" "I would like to walk with minimal pain and get up and down with no assistance"  OBJECTIVE:   COGNITION: Overall cognitive status: Impaired   SENSATION: Denies numbness/tingling in BUE/BLEs  COORDINATION: Heel to shin test: WNL bilaterally   EDEMA: Frequent swelling in R ankle    POSTURE: rounded shoulders, forward head, and left pelvic obliquity  LOWER EXTREMITY MMT:  Tested in seated position   MMT Right Eval Left Eval  Hip flexion 4+ 5  Hip extension    Hip abduction 4 4  Hip adduction 5 5  Hip internal rotation    Hip external rotation    Knee flexion 5 5  Knee extension 5 5  Ankle dorsiflexion 3 5  Ankle plantarflexion    Ankle inversion    Ankle eversion    (Blank rows = not tested)   TODAY'S TREATMENT:   Ther Act  Pt inquiring about purpose of CPAP machine and initially stating he would not wear one despite needing one per his sleep study. Educated pt on purpose of CPAP and importance of wearing one to reduce hypoxemia. Pt then inquiring if he can wear a nasal cannula instead, so educated on the difference  between nasal cannula and CPAP and how a cannula is not appropriate. Pt verbalized understanding.  Discussed events that led to pt's fall earlier in the week and how he got up from the ground. Pt reports that he tripped over a rug in the gallery and his wife, Gigi Gin, had to "lift me up". Pt was not using his walker. Educated pt on the 3 things to look for prior to standing from a fall (broken bones, excessive bleeding, hitting his head) and encouraged pt to start doing a quick scan of his environment to highlight fall risk items and plan for how to avoid a fall. In this case, throw rugs are the high fall risk item and pt could use his walker or avoid the rug to avoid a fall. Pt states he did not "need the walker" as he was "just plugging things in". Reminded pt that bending forward has been a cause of falls for him in the past and the walker is to be used as a steadying assist. Pt verbalized understanding.   NMR  Reviewed and demonstrated proper floor transfers for improved safety w/fall recovery and BLE strength. Had pt demonstrate this to therapist without cues to better  assess pt's knowledge and pt slid down onto floor mat in anterior direction to lower to floor. Once on floor, pt in quadruped but stated he needed to get on his back and "roll around" to move. Cued pt on crawling in quadruped instead of rolling and pt able to crawl towards mat table and perform tall kneel > half kneel >standing w/CGA. Demonstrated to pt how to perform proper transfer from mat table to floor mat and pt able to teach back and demonstrate w/CGA. Final floor mat to mat table transfer w/CGA. Pt will benefit from continued practice with transfer for improved fall recovery, safety and global strength.    TRANSFERS: Assistive device utilized: None  Sit to stand: SBA/CGA Stand to sit: SBA/CGA Pt initially relies heavily on BUE support w/very minimal anterior lean. Pt with BLE bracing against mat table and more narrow BOS.       PATIENT EDUCATION: Education details: See ther act section  Person educated: Patient and Spouse Education method: Explanation, Demonstration, Verbal cues, and Handouts Education comprehension: verbalized understanding, returned demonstration, verbal cues required, and needs further education  HOME EXERCISE PROGRAM: Seated PWR Up/Step   Access Code: [email or web address] URL: [email or web address] Date: 11/20/2022 Prepared by: Sherlie Ban  Exercises - Sit to Stand with Armchair  - 1-2 x daily - 5 x weekly - 2 sets - 10 reps  GOALS: Goals reviewed with patient? Yes  SHORT TERM GOALS: Target date: 12/13/2022   Pt will be independent with initial HEP for improved strength, balance, transfers and gait.  Baseline: not established on eval  Goal status: INITIAL  2.  Pt will improve gait velocity to at least 2.54ft/s w/LRAD and CGA for improved gait efficiency and reduced fall risk   Baseline: 1.95 ft/s w/o AD and min A Goal status: INITIAL  3.  Pt will perform single sit to stand without UE support w/CGA and no retropulsion for improved safety w/transfers and body mechanics   Baseline: mod A due to significant retropulsion  Goal status: INITIAL  4. Pt will improve TUG time to 17 seconds or less with LRAD in order to demo decrease fall risk. Baseline: 20 seconds no AD Goal status: INITIAL  5.  Pt and wife will verbalize fall prevention techniques for home and community setting for reduced fall frequency and improved safety.  Baseline: Pt falling multiple times per week Goal status: INITIAL  6.  Pt will trial various assistive devices in clinic to determine safest option for mobility  Baseline: not using AD - has SPC Goal status: INITIAL    LONG TERM GOALS: Target date: 01/10/2023  Pt will be independent with final HEP for improved strength, balance, transfers and gait.  Baseline:  Goal status: INITIAL  2.  Pt will improve gait velocity to at least 2.6 ft/s  w/LRAD and SBA for improved gait efficiency and independence   Baseline: 1.95 ft/s w/o AD and min A Goal status: INITIAL  3.  Pt will improve TUG time to 15 seconds or less with LRAD and supervision  in order to demo decrease fall risk. Baseline: 20 seconds with no AD, CGA Goal status: INITIAL  4.  Pt will improve 5 x STS to less than or equal to 15 seconds w/good posture and form to demonstrate improved functional strength and transfer efficiency.   Baseline: 15.41s w/BUE support, significant posterior lean and flexed posture  Goal status: INITIAL  5.  Pt will trial various bracing options on RLE to reduce foot slap and fall  frequency.  Baseline:  Goal status: INITIAL   ASSESSMENT:  CLINICAL IMPRESSION: Emphasis of skilled PT session on pt education regarding use of RW, identifying fall hazards and practicing floor transfers. Pt reports he had one fall this week due to tripping over throw rug and he did not have his RW. Pt's wife had to lift pt to get up, so practiced proper floor transfers during session. Pt required min-mod verbal cues to properly perform and would benefit from continued practice in different scenarios. Continue POC.    OBJECTIVE IMPAIRMENTS: Abnormal gait, decreased balance, decreased cognition, decreased coordination, decreased knowledge of condition, decreased knowledge of use of DME, decreased mobility, difficulty walking, decreased strength, decreased safety awareness, impaired perceived functional ability, improper body mechanics, postural dysfunction, and pain  ACTIVITY LIMITATIONS: carrying, lifting, bending, standing, squatting, stairs, transfers, bed mobility, reach over head, locomotion level, and caring for others  PARTICIPATION LIMITATIONS: medication management, interpersonal relationship, driving, shopping, community activity, and yard work  PERSONAL FACTORS: Age, Behavior pattern, Fitness, Past/current experiences, and 1 comorbidity: Vascular  parkinsonism  are also affecting patient's functional outcome.   REHAB POTENTIAL: Fair due to diagnosis   CLINICAL DECISION MAKING: Evolving/moderate complexity  EVALUATION COMPLEXITY: Moderate  PLAN:  PT FREQUENCY: 2x/week  PT DURATION: 8 weeks  PLANNED INTERVENTIONS: Therapeutic exercises, Therapeutic activity, Neuromuscular re-education, Balance training, Gait training, Patient/Family education, Self Care, Joint mobilization, Stair training, Vestibular training, Canalith repositioning, Orthotic/Fit training, DME instructions, Manual therapy, and Re-evaluation  PLAN FOR NEXT SESSION: practice gait with RW. Add to HEP for increased step length, posture. Try some seated PWR moves?  Trial braces on RLE. Floor transfer    Jill Alexanders Hayzel Ruberg, PT, DPT 11/29/2022, 11:19 AM

## 2022-12-03 ENCOUNTER — Encounter: Payer: Self-pay | Admitting: Physical Therapy

## 2022-12-03 ENCOUNTER — Ambulatory Visit: Payer: Medicare Other | Admitting: Physical Therapy

## 2022-12-03 DIAGNOSIS — R293 Abnormal posture: Secondary | ICD-10-CM | POA: Diagnosis not present

## 2022-12-03 DIAGNOSIS — R2689 Other abnormalities of gait and mobility: Secondary | ICD-10-CM

## 2022-12-03 DIAGNOSIS — R2681 Unsteadiness on feet: Secondary | ICD-10-CM

## 2022-12-03 NOTE — Patient Instructions (Addendum)
Tips to reduce freezing episodes with standing or walking:   Stand tall with your feet wide, so that you can rock and weight shift through your hips. Don't try to fight the freeze: if you begin taking slower, faster, smaller steps, STOP, get your posture tall, and RESET your posture and balance.  Take a deep breath before taking the BIG step to start again. March in place, with high knee stepping, to get started walking again. Use auditory cues:  Count out loud, think of a familiar tune or song or cadence, use pocket metronome, to use rhythm to get started walking again. Use visual cues:  Use a line to step over, use laser pointer line to step over, (using BIG steps) to start walking again. Use visual targets to keep your posture tall (look ahead and focus on an object or target at eye level). As you approach where your destination with walking, count your steps out loud and/or focus on your target with your eyes until you are fully there. Use appropriate assistive device, as advised by your physical therapist to assist with taking longer, consistent steps.   

## 2022-12-03 NOTE — Therapy (Signed)
OUTPATIENT PHYSICAL THERAPY NEURO TREATMENT   Patient Name: [name] MRN: [id number] DOB:February 07, 1946, 77 y.o., male Today's Date: 12/03/2022   PCP: Ronnald Nian, MD  REFERRING PROVIDER: Vladimir Faster, DO  END OF SESSION:  PT End of Session - 12/03/22 1152     Visit Number 5    Number of Visits 17   Plus eval   Date for PT Re-Evaluation 01/10/23    Authorization Type [employer] Medicare    Progress Note Due on Visit 10    PT Start Time 1150   pt late to session   PT Stop Time 1232    PT Time Calculation (min) 42 min    Equipment Utilized During Treatment Gait belt    Activity Tolerance Patient tolerated treatment well    Behavior During Therapy WFL for tasks assessed/performed              Past Medical History:  Diagnosis Date   ADD (attention deficit disorder)    Allergic rhinitis    skin test 02/23/08 vaccine 2009; allergy shots weekly through Dr. Maple Hudson   Anemia    takes Fe -    Anginal pain (HCC)    Ankylosing spondylitis (HCC)    Anxiety    sees therapist Vernell Leep   Arthritis    Asthma    Dr. 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 Donnie Aho   CHEILECTOMY  10/16/2011   Procedure: CHEILECTOMY;  Surgeon: Sherri Rad, MD;  Location: [facility];  Service: Orthopedics;  Laterality: Right;   CHOLECYSTECTOMY     COLONOSCOPY     CORONARY ANGIOPLASTY     1995 after MI   CORONARY ANGIOPLASTY WITH STENT PLACEMENT     1999    ESOPHAGOGASTRODUODENOSCOPY  02/18/2012   Procedure: ESOPHAGOGASTRODUODENOSCOPY (EGD);  Surgeon: Florencia Reasons, MD;  Location: [facility];  Service: Endoscopy;  Laterality: N/A;   FOOT SURGERY     joint scraped, arthritis right foot   HERNIA REPAIR     LUMBAR DISC SURGERY     x2   right index finger  mass removed- arthritis     Dr. Amanda Pea   [facility]  Right 05/18/2021   medial toe consistent with callus   SKIN BIOPSY Left 05/17/2021   actinic keratosis   SPINAL CORD STIMULATOR BATTERY EXCHANGE N/A 10/11/2021   Procedure: SPINAL CORD STIMULATOR  BATTERY EXCHANGE;  Surgeon: Venita Lick, MD;  Location: [facility] OR;  Service: Orthopedics;  Laterality: N/A;  60 mins   SPINAL CORD STIMULATOR INSERTION N/A 11/21/2016   Procedure: LUMBAR SPINAL CORD STIMULATOR INSERTION;  Surgeon: Venita Lick, MD;  Location: [facility] OR;  Service: Orthopedics;  Laterality: N/A;  Requests 2.5 hrs   TONSILLECTOMY     1953   UPPER GI ENDOSCOPY  [id number]   VASECTOMY     1980   Patient Active Problem List   Diagnosis Date Noted   OSA (obstructive sleep apnea) 11/12/2022   Parkinson's disease 11/12/2022   Snoring 10/18/2022   Peripheral vascular disease (HCC) 12/25/2021   Dizziness 12/24/2021   Leg pain, left 11/14/2021   Chronic low back pain with sciatica 11/14/2021   Spinal cord stimulator status 11/14/2021   Muscle spasm 11/14/2021   Constipation 09/11/2021   Slow transit constipation 09/11/2021   Cough 09/11/2021   Early satiety 09/11/2021   History of malignant neoplasm of colon  09/11/2021   Hoarseness 09/11/2021   Iron deficiency 09/11/2021   Personal history of colonic polyps 09/11/2021   Weight decreased 09/11/2021   Esophageal motility disorder 09/11/2021   Gastritis 09/11/2021   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   Pain in both feet 08/10/2021   Diverticulitis 02/26/2021   Atherosclerosis of aorta (HCC) 08/30/2020   Mild concentric left ventricular hypertrophy (LVH)    Hypogonadism male    History of kidney stones    Erectile dysfunction    COPD (chronic obstructive pulmonary disease) (HCC)    Acquired trigger finger of left middle finger 04/22/2019   Acquired trigger finger of right middle finger 04/22/2019   Left ventricular dysfunction 10/12/2018   Ankylosing spondylitis (HCC) 10/09/2018   Presbycusis of both ears 04/23/2018   Mild intermittent asthma without complication 04/23/2018   Headache 01/30/2017   Chronic pain 11/21/2016   Arthritis 10/15/2016   Spondylolisthesis of lumbar region 02/28/2015   Hyperlipidemia    Lumbar disc disease    Hypertension    CAD (coronary artery disease) 05/19/2012   GERD (gastroesophageal reflux disease)    Depression with anxiety    Chronic asthma 05/17/2011   Allergic rhinitis due to pollen 08/08/2010   History of benign positional vertigo    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 Celebrated his 52nd wedding anniversary with [name] yesterday. No new falls. Did things in the yard and was seated.  Exercises are going slow at home. Has been using the RW all the time.   Pt accompanied by:  Wife, [name]  PERTINENT HISTORY: Pt falls frequently, always in forward direction and insists on using back stairs despite frequent falls over top step. Started Sinemet week of 11/04/22  PAIN:  Are you having pain? No  PRECAUTIONS: Fall  WEIGHT BEARING RESTRICTIONS: No  FALLS: Has patient fallen in last 6 months? Yes. Number of falls "more than I can count"  LIVING ENVIRONMENT: Lives with: lives with their spouse Lives in: House/apartment Stairs: Yes: External: 3 in front and 5 in back steps; on right going up, on left going up, can reach both, and always uses back steps (where he has the most falls) Has following equipment at home: Single point cane  PLOF: Independent  PATIENT GOALS: "I want to be in the next Olympics" "I would like to walk with minimal pain and get up and down with no assistance"  OBJECTIVE:   COGNITION: Overall cognitive status: Impaired   SENSATION: Denies numbness/tingling in BUE/BLEs  COORDINATION: Heel to shin test: WNL bilaterally   EDEMA: Frequent swelling in R ankle    POSTURE: rounded shoulders, forward head, and left pelvic obliquity  LOWER EXTREMITY MMT:  Tested in seated position   MMT Right Eval Left Eval  Hip flexion 4+ 5  Hip extension    Hip abduction 4 4  Hip adduction 5 5  Hip internal rotation    Hip external rotation    Knee flexion 5 5  Knee extension 5 5  Ankle dorsiflexion 3 5  Ankle plantarflexion    Ankle inversion    Ankle eversion    (Blank rows = not tested)   TODAY'S TREATMENT:   Ther Act  Education on techniques to help with freezing/festination episodes at home to decr fall risk and purpose of weight shifting, and provided handout: STOP and reset with tall posture and rock through hips and then take a big step to continue, practiced x2 times during session with pt performing rocking technique for weight  shifting.  TRANSFERS: Assistive device utilized: None  Sit to stand: SBA Stand to sit: SBA Reviewed technique for sit <> stands and pressing up from mat table and not RW: Went over cues to scoot out towards edge, widening BOS, tucking feet under, and big effort with forward lean. Cues for tall posture and looking ahead once in standing, performed 10 reps   Therapeutic Exercise SciFit Multi-peaks at Gear 2.5 for 8 minutes, pt initially reporting effort level as 3/10 for larger amplitude movement patterns and not having much ROM. PT encouraging pt throughout for larger effort and movements and helping move SciFit at times. Cued to reach up to an effort level as 7/10. Pt reports his ADD has him distracted from keeping movements big. During last 2 minutes, pt reporting his effort level at a 6-7/10. Afterwards during gait with RW during session, pt realized it was easier to pick up his R feet   NMR: At countertop:  Pt performs PWR! Moves in standing position at countertop:   PWR! Rock for improved weight shifting x10 reps each side  PWR! Step for improved step initiation x10 reps each side, with focus on incr foot clearance, esp with RLE   Cues provided for larger amplitude movement patterns and  esp foot clearance with RLE when stepping. Cues to work towards a 7/10. Educated on purpose of exercises and how they relate to function.     PATIENT EDUCATION: Education details: Use of RW at all times, strategies for freezing/festination strategies, standing PWR Rock and Step to [employer] at [employer] educated: Patient and Spouse Education method: Programmer, multimedia, Facilities manager, Verbal cues, and Handouts Education comprehension: verbalized understanding, returned demonstration, verbal cues required, and needs further education  HOME EXERCISE PROGRAM: Seated PWR Up/Step  Standing PWR Rock/Step  Access Code: [email or web address] URL: [email or web address] Date: 11/20/2022 Prepared by: Sherlie Ban  Exercises - Sit to Stand with Armchair  - 1-2 x daily - 5 x weekly - 2 sets - 10 reps  GOALS: Goals reviewed with patient? Yes  SHORT TERM GOALS: Target date: 12/13/2022   Pt will be independent with initial HEP for improved strength, balance, transfers and gait.  Baseline: not established on eval  Goal status: INITIAL  2.  Pt will improve gait velocity to at least 2.70ft/s w/LRAD and CGA for improved gait efficiency and reduced fall risk   Baseline: 1.95 ft/s w/o AD and min A Goal status: INITIAL  3.  Pt will perform single sit to stand without UE support w/CGA and no retropulsion for improved safety w/transfers and body mechanics   Baseline: mod A due to significant retropulsion  Goal status: INITIAL  4. Pt will improve TUG time to 17 seconds or less with LRAD in order to demo decrease fall risk. Baseline: 20 seconds no AD Goal status: INITIAL  5.  Pt and wife will verbalize fall prevention techniques for home and community setting for reduced fall frequency and improved safety.  Baseline: Pt falling multiple times per week Goal status: INITIAL  6.  Pt will trial various assistive devices in clinic to determine safest option for mobility  Baseline: not using AD - has SPC Goal status: INITIAL    LONG TERM GOALS: Target date: 01/10/2023  Pt will be independent with final HEP for improved strength, balance, transfers and gait.  Baseline:  Goal status: INITIAL  2.  Pt will improve gait velocity to at least 2.6 ft/s w/LRAD and SBA for improved gait efficiency and independence   Baseline: 1.95 ft/s w/o AD and min A Goal status: INITIAL  3.  Pt will improve TUG time to 15 seconds or less with LRAD and supervision  in order to demo decrease fall risk. Baseline: 20 seconds with no AD, CGA Goal status: INITIAL  4.  Pt will improve 5 x STS to less than or equal to 15 seconds w/good posture and form to demonstrate improved functional strength and transfer  efficiency.   Baseline: 15.41s w/BUE support, significant posterior lean and flexed posture  Goal status: INITIAL  5.  Pt will trial various bracing options on RLE to reduce foot slap and fall frequency.  Baseline:  Goal status: INITIAL   ASSESSMENT:  CLINICAL IMPRESSION: Today's skilled session focused on education for freezing/festination and strategies as pt reports episodes at home when he is not using an AD. Also educated on importance of use of RW at all times to decr fall risk. Pt able to demo understanding of stopping, reset with tall posture and then taking a big step to continue again. Remainder of session focused on attempting larger amplitude movements on SciFit (needs max cues throughout for this) and adding standing PWR Rock and Step at [employer] to [employer] for balance. Pt needing cues for foot clearance with  RLE.  Continue POC.    OBJECTIVE IMPAIRMENTS: Abnormal gait, decreased balance, decreased cognition, decreased coordination, decreased knowledge of condition, decreased knowledge of use of DME, decreased mobility, difficulty walking, decreased strength, decreased safety awareness, impaired perceived functional ability, improper body mechanics, postural dysfunction, and pain  ACTIVITY LIMITATIONS: carrying, lifting, bending, standing, squatting, stairs, transfers, bed mobility, reach over head, locomotion level, and caring for others  PARTICIPATION LIMITATIONS: medication management, interpersonal relationship, driving, shopping, community activity, and yard work  PERSONAL FACTORS: Age, Behavior pattern, Fitness, Past/current experiences, and 1 comorbidity: Vascular parkinsonism  are also affecting patient's functional outcome.   REHAB POTENTIAL: Fair due to diagnosis   CLINICAL DECISION MAKING: Evolving/moderate complexity  EVALUATION COMPLEXITY: Moderate  PLAN:  PT FREQUENCY: 2x/week  PT DURATION: 8 weeks  PLANNED INTERVENTIONS: Therapeutic exercises, Therapeutic  activity, Neuromuscular re-education, Balance training, Gait training, Patient/Family education, Self Care, Joint mobilization, Stair training, Vestibular training, Canalith repositioning, Orthotic/Fit training, DME instructions, Manual therapy, and Re-evaluation  PLAN FOR NEXT SESSION: standing balance, RLE foot clearance tasks. practice gait with RW. Add to HEP for increased step length, posture as needed. Trial braces on RLE. Floor transfer    Drake Leach, PT, DPT 12/03/2022, 12:48 PM

## 2022-12-06 ENCOUNTER — Ambulatory Visit: Payer: Medicare Other | Admitting: Physical Therapy

## 2022-12-06 DIAGNOSIS — R2689 Other abnormalities of gait and mobility: Secondary | ICD-10-CM

## 2022-12-06 DIAGNOSIS — R293 Abnormal posture: Secondary | ICD-10-CM

## 2022-12-06 DIAGNOSIS — R2681 Unsteadiness on feet: Secondary | ICD-10-CM

## 2022-12-06 NOTE — Therapy (Signed)
OUTPATIENT PHYSICAL THERAPY NEURO TREATMENT   Patient Name: [name] MRN: [id number] DOB:08/12/1945, 77 y.o., male Today's Date: 12/06/2022   PCP: Ronnald Nian, MD  REFERRING PROVIDER: Vladimir Faster, DO  END OF SESSION:  PT End of Session - 12/06/22 1106     Visit Number 6    Number of Visits 17   Plus eval   Date for PT Re-Evaluation 01/10/23    Authorization Type [employer]    Progress Note Due on Visit 10    PT Start Time 1103    PT Stop Time 1145    PT Time Calculation (min) 42 min    Equipment Utilized During Treatment Gait belt    Activity Tolerance Patient tolerated treatment well    Behavior During Therapy WFL for tasks assessed/performed               Past Medical History:  Diagnosis Date   ADD (attention deficit disorder)    Allergic rhinitis    skin test 02/23/08 vaccine 2009; allergy shots weekly through Dr. Maple Hudson   Anemia    takes Fe -    Anginal pain (HCC)    Ankylosing spondylitis (HCC)    Anxiety    sees therapist Vernell Leep   Arthritis    Asthma    Dr. 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 Donnie Aho   CHEILECTOMY  10/16/2011   Procedure: CHEILECTOMY;  Surgeon: Sherri Rad, MD;  Location: [facility];  Service: Orthopedics;  Laterality: Right;   CHOLECYSTECTOMY     COLONOSCOPY     CORONARY ANGIOPLASTY     1995 after MI   CORONARY ANGIOPLASTY WITH STENT PLACEMENT     1999    ESOPHAGOGASTRODUODENOSCOPY  02/18/2012   Procedure: ESOPHAGOGASTRODUODENOSCOPY (EGD);  Surgeon: Florencia Reasons, MD;  Location: [facility];  Service: Endoscopy;  Laterality: N/A;   FOOT SURGERY     joint scraped, arthritis right foot   HERNIA REPAIR     LUMBAR DISC SURGERY     x2   right index finger  mass removed- arthritis     Dr. Amanda Pea   [facility]  Right 05/18/2021   medial toe consistent with callus   SKIN BIOPSY Left 05/17/2021   actinic keratosis   SPINAL CORD STIMULATOR BATTERY EXCHANGE N/A 10/11/2021   Procedure: SPINAL CORD STIMULATOR BATTERY EXCHANGE;  Surgeon:  Venita Lick, MD;  Location: [facility];  Service: Orthopedics;  Laterality: N/A;  60 mins   SPINAL CORD STIMULATOR INSERTION N/A 11/21/2016   Procedure: LUMBAR SPINAL CORD STIMULATOR INSERTION;  Surgeon: Venita Lick, MD;  Location: [facility];  Service: Orthopedics;  Laterality: N/A;  Requests 2.5 hrs   TONSILLECTOMY     1953   UPPER GI ENDOSCOPY  [id number]   VASECTOMY     1980   Patient Active Problem List   Diagnosis Date Noted   OSA (obstructive sleep apnea) 11/12/2022   Parkinson's disease 11/12/2022   Snoring 10/18/2022   Peripheral vascular disease (HCC) 12/25/2021   Dizziness 12/24/2021   Leg pain, left 11/14/2021   Chronic low back pain with sciatica 11/14/2021   Spinal cord stimulator status 11/14/2021   Muscle spasm 11/14/2021   Constipation 09/11/2021   Slow transit constipation 09/11/2021   Cough 09/11/2021   Early satiety 09/11/2021   History of malignant neoplasm of colon 09/11/2021    Hoarseness 09/11/2021   Iron deficiency 09/11/2021   Personal history of colonic polyps 09/11/2021   Weight decreased 09/11/2021   Esophageal motility disorder 09/11/2021   Gastritis 09/11/2021   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   Pain in both feet 08/10/2021   Diverticulitis 02/26/2021   Atherosclerosis of aorta (HCC) 08/30/2020   Mild concentric left ventricular hypertrophy (LVH)    Hypogonadism male    History of kidney stones    Erectile dysfunction    COPD (chronic obstructive pulmonary disease) (HCC)    Acquired trigger finger of left middle finger 04/22/2019   Acquired trigger finger of right middle finger 04/22/2019   Left ventricular dysfunction 10/12/2018   Ankylosing spondylitis (HCC) 10/09/2018   Presbycusis of both ears 04/23/2018   Mild intermittent asthma without complication 04/23/2018   Headache 01/30/2017   Chronic pain 11/21/2016   Arthritis 10/15/2016   Spondylolisthesis of lumbar region 02/28/2015   Hyperlipidemia    Lumbar disc disease    Hypertension    CAD (coronary artery disease) 05/19/2012   GERD (gastroesophageal reflux disease)    Depression with anxiety    Chronic asthma 05/17/2011   Allergic rhinitis due to pollen 08/08/2010   History of benign positional vertigo    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 Reports he had a fall on Wednesday while helping carry groceries into house. Tripped over door ledge and landed on his face.  [name] assisted him up. Reports he shattered his glasses and has a few bruises but otherwise denies injuries.   Pt accompanied by:  Wife, [name]  PERTINENT HISTORY: Pt falls frequently, always in forward direction and insists on using back stairs despite frequent falls over top step. Started Sinemet week of 11/04/22  PAIN:  Are you having pain? No  PRECAUTIONS: Fall  WEIGHT BEARING RESTRICTIONS: No  FALLS: Has patient fallen in last 6 months? Yes. Number of falls "more than I can count"  LIVING ENVIRONMENT: Lives with: lives with their spouse Lives in: House/apartment Stairs: Yes: External: 3 in front and 5 in back steps; on right going up, on left going up, can reach both, and always uses back steps (where he has the most falls) Has following equipment at home: Single point cane  PLOF: Independent  PATIENT GOALS: "I want to be in the next Olympics" "I would like to walk with minimal pain and get up and down with no assistance"  OBJECTIVE:   COGNITION: Overall cognitive status: Impaired   SENSATION: Denies numbness/tingling in BUE/BLEs  COORDINATION: Heel to shin test: WNL bilaterally   EDEMA: Frequent swelling in R ankle    POSTURE: rounded shoulders, forward head, and left pelvic obliquity  LOWER EXTREMITY MMT:  Tested in seated position   MMT Right Eval Left Eval  Hip flexion 4+ 5  Hip extension    Hip abduction 4 4  Hip adduction 5 5  Hip internal rotation    Hip external rotation    Knee flexion 5 5  Knee extension 5 5  Ankle dorsiflexion 3 5  Ankle plantarflexion    Ankle inversion    Ankle eversion    (Blank rows = not tested)   TODAY'S TREATMENT:   Ther Ex SciFit multi-peaks level 5 for 8 minutes using BUE/BLEs for neural priming for reciprocal movement, dynamic cardiovascular warmup and increased amplitude of stepping. Max verbal cues to maintain 7/10 effort and high amplitude movement throughout. RPE of 4/10 following activity   NMR  6 Blaze  pods on random reach setting for improved LE coordination, increased step length/clearance.  Performed on 2 minute intervals with 2-3 minute rest periods.  Pt requires min A guarding. Performed at ballet bar Round 1:  lateral setup navigating two 4" foam beams.  29 hits. Mod verbal cues to maintain side stepping, as pt turning forward. Noted decreased step clearance w/RLE >LLE Round 2:  same setup as round 1.  27 hits. Improved side stepping this round  Round 3:  3 pods placed in front of 4" foam beam to work on adv/retreat setup.  37 hits. RPE of 7/10 following activity  Notable errors/deficits:  Pt frequently knocking beam over w/RLE and demonstrating poor foot placement despite max cues throughout.     TRANSFERS: Assistive device utilized: None  Sit to stand: SBA Stand to sit: SBA  PATIENT EDUCATION: Education details: Use of RW at all times, continue HEP  Person educated: Patient and Spouse Education method: Programmer, multimedia, Facilities manager, Verbal cues, and Handouts Education comprehension: verbalized understanding, returned demonstration, verbal cues required, and needs further education  HOME EXERCISE PROGRAM: Seated PWR Up/Step  Standing PWR Rock/Step  Access Code: [email or web address] URL: [email or web address] Date: 11/20/2022 Prepared by: Sherlie Ban  Exercises - Sit to Stand with Armchair  -  1-2 x daily - 5 x weekly - 2 sets - 10 reps  GOALS: Goals reviewed with patient? Yes  SHORT TERM GOALS: Target date: 12/13/2022   Pt will be independent with initial HEP for improved strength, balance, transfers and gait.  Baseline: not established on eval  Goal status: INITIAL  2.  Pt will improve gait velocity to at least 2.67ft/s w/LRAD and CGA for improved gait efficiency and reduced fall risk   Baseline: 1.95 ft/s w/o AD and min A Goal status: INITIAL  3.  Pt will perform single sit to stand without UE support w/CGA and no retropulsion for improved safety w/transfers  and body mechanics   Baseline: mod A due to significant retropulsion  Goal status: INITIAL  4. Pt will improve TUG time to 17 seconds or less with LRAD in order to demo decrease fall risk. Baseline: 20 seconds no AD Goal status: INITIAL  5.  Pt and wife will verbalize fall prevention techniques for home and community setting for reduced fall frequency and improved safety.  Baseline: Pt falling multiple times per week Goal status: INITIAL  6.  Pt will trial various assistive devices in clinic to determine safest option for mobility  Baseline: not using AD - has SPC Goal status: INITIAL    LONG TERM GOALS: Target date: 01/10/2023  Pt will be independent with final HEP for improved strength, balance, transfers and gait.  Baseline:  Goal status: INITIAL  2.  Pt will improve gait velocity to at least 2.6 ft/s w/LRAD and SBA for improved gait efficiency and independence   Baseline: 1.95 ft/s w/o AD and min A Goal status: INITIAL  3.  Pt will improve TUG time to 15 seconds or less with LRAD and supervision  in order to demo decrease fall risk. Baseline: 20 seconds with no AD, CGA Goal status: INITIAL  4.  Pt will improve 5 x STS to less than or equal to 15 seconds w/good posture and form to demonstrate improved functional strength and transfer efficiency.   Baseline: 15.41s w/BUE support, significant posterior lean and flexed posture  Goal status: INITIAL  5.  Pt will trial various bracing options on RLE to reduce foot slap and fall frequency.  Baseline:  Goal status: INITIAL   ASSESSMENT:  CLINICAL IMPRESSION: Emphasis of skilled PT session on endurance, increased amplitude of movement and increased step clearance/length. Pt continues to require max cues to sustain attention to Scifit and maintain high amplitude movement. Pt demonstrates decreased step clearance of RLE, frequently knocking foam beam over w/blaze pod activity despite max cues to slow down and focus on foot  placement. Continue POC.    OBJECTIVE IMPAIRMENTS: Abnormal gait, decreased balance, decreased cognition, decreased coordination, decreased knowledge of condition, decreased knowledge of use of DME, decreased mobility, difficulty walking, decreased strength, decreased safety awareness, impaired perceived functional ability, improper body mechanics, postural dysfunction, and pain  ACTIVITY LIMITATIONS: carrying, lifting, bending, standing, squatting, stairs, transfers, bed mobility, reach over head, locomotion level, and caring for others  PARTICIPATION LIMITATIONS: medication management, interpersonal relationship, driving, shopping, community activity, and yard work  PERSONAL FACTORS: Age, Behavior pattern, Fitness, Past/current experiences, and 1 comorbidity: Vascular parkinsonism  are also affecting patient's functional outcome.   REHAB POTENTIAL: Fair due to diagnosis   CLINICAL DECISION MAKING: Evolving/moderate complexity  EVALUATION COMPLEXITY: Moderate  PLAN:  PT FREQUENCY: 2x/week  PT DURATION: 8 weeks  PLANNED INTERVENTIONS: Therapeutic exercises, Therapeutic activity, Neuromuscular re-education, Balance training, Gait training, Patient/Family education, Self Care, Joint  mobilization, Stair training, Vestibular training, Canalith repositioning, Orthotic/Fit training, DME instructions, Manual therapy, and Re-evaluation  PLAN FOR NEXT SESSION: standing balance, RLE foot clearance tasks. practice gait with RW. Add to HEP for increased step length, posture as needed. Trial braces on RLE. Floor transfer    Jill Alexanders Kesi Perrow, PT, DPT 12/06/2022, 11:54 AM

## 2022-12-10 ENCOUNTER — Ambulatory Visit: Payer: Medicare Other | Attending: Neurology | Admitting: Physical Therapy

## 2022-12-10 DIAGNOSIS — R471 Dysarthria and anarthria: Secondary | ICD-10-CM | POA: Insufficient documentation

## 2022-12-10 DIAGNOSIS — R296 Repeated falls: Secondary | ICD-10-CM | POA: Diagnosis present

## 2022-12-10 DIAGNOSIS — R2681 Unsteadiness on feet: Secondary | ICD-10-CM | POA: Diagnosis present

## 2022-12-10 DIAGNOSIS — R293 Abnormal posture: Secondary | ICD-10-CM | POA: Insufficient documentation

## 2022-12-10 DIAGNOSIS — R2689 Other abnormalities of gait and mobility: Secondary | ICD-10-CM | POA: Diagnosis present

## 2022-12-10 NOTE — Therapy (Signed)
OUTPATIENT PHYSICAL THERAPY NEURO TREATMENT   Patient Name: [name] MRN: [id number] DOB:1945-07-07, 77 y.o., male Today's Date: 12/10/2022   PCP: Ronnald Nian, MD  REFERRING PROVIDER: Vladimir Faster, DO  END OF SESSION:  PT End of Session - 12/10/22 1108     Visit Number 7    Number of Visits 17   Plus eval   Date for PT Re-Evaluation 01/10/23    Authorization Type [employer] Medicare    Progress Note Due on Visit 10    PT Start Time 1105    PT Stop Time 1147    PT Time Calculation (min) 42 min    Equipment Utilized During Treatment Gait belt    Activity Tolerance Patient tolerated treatment well    Behavior During Therapy WFL for tasks assessed/performed                Past Medical History:  Diagnosis Date   ADD (attention deficit disorder)    Allergic rhinitis    skin test 02/23/08 vaccine 2009; allergy shots weekly through Dr. Maple Hudson   Anemia    takes Fe -    Anginal pain (HCC)    Ankylosing spondylitis (HCC)    Anxiety    sees therapist Vernell Leep   Arthritis    Asthma    Dr. 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 Donnie Aho   CHEILECTOMY  10/16/2011   Procedure: CHEILECTOMY;  Surgeon: Sherri Rad, MD;  Location: [facility];  Service: Orthopedics;  Laterality: Right;   CHOLECYSTECTOMY     COLONOSCOPY     CORONARY ANGIOPLASTY     1995 after MI   CORONARY ANGIOPLASTY WITH STENT PLACEMENT     1999    ESOPHAGOGASTRODUODENOSCOPY  02/18/2012   Procedure: ESOPHAGOGASTRODUODENOSCOPY (EGD);  Surgeon: Florencia Reasons, MD;  Location: [facility];  Service: Endoscopy;  Laterality: N/A;   FOOT SURGERY     joint scraped, arthritis right foot   HERNIA REPAIR     LUMBAR DISC SURGERY     x2   right index finger  mass removed- arthritis     Dr. Amanda Pea   [facility]  Right 05/18/2021   medial toe consistent with callus   SKIN BIOPSY Left 05/17/2021   actinic keratosis   SPINAL CORD STIMULATOR BATTERY EXCHANGE N/A 10/11/2021   Procedure: SPINAL CORD STIMULATOR BATTERY EXCHANGE;  Surgeon: Venita Lick, MD;  Location: [facility] OR;  Service: Orthopedics;  Laterality: N/A;  60 mins   SPINAL CORD STIMULATOR INSERTION N/A 11/21/2016   Procedure: LUMBAR SPINAL CORD STIMULATOR INSERTION;  Surgeon: Venita Lick, MD;  Location: [facility];  Service: Orthopedics;  Laterality: N/A;  Requests 2.5 hrs   TONSILLECTOMY     1953   UPPER GI ENDOSCOPY  [id number]   VASECTOMY     1980   Patient Active Problem List   Diagnosis Date Noted   OSA (obstructive sleep apnea) 11/12/2022   Parkinson's disease 11/12/2022   Snoring 10/18/2022   Peripheral vascular disease (HCC) 12/25/2021   Dizziness 12/24/2021   Leg pain, left 11/14/2021   Chronic low back pain with sciatica 11/14/2021   Spinal cord stimulator status 11/14/2021   Muscle spasm 11/14/2021   Constipation 09/11/2021   Slow transit constipation 09/11/2021   Cough 09/11/2021   Early satiety 09/11/2021   History of malignant neoplasm of colon  09/11/2021   Hoarseness 09/11/2021   Iron deficiency 09/11/2021   Personal history of colonic polyps 09/11/2021   Weight decreased 09/11/2021   Esophageal motility disorder 09/11/2021   Gastritis 09/11/2021   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   Pain in both feet 08/10/2021   Diverticulitis 02/26/2021   Atherosclerosis of aorta (HCC) 08/30/2020   Mild concentric left ventricular hypertrophy (LVH)    Hypogonadism male    History of kidney stones    Erectile dysfunction    COPD (chronic obstructive pulmonary disease) (HCC)    Acquired trigger finger of left middle finger 04/22/2019   Acquired trigger finger of right middle finger 04/22/2019   Left ventricular dysfunction 10/12/2018   Ankylosing spondylitis (HCC) 10/09/2018   Presbycusis of both ears 04/23/2018   Mild intermittent asthma without complication 04/23/2018   Headache 01/30/2017   Chronic pain 11/21/2016   Arthritis 10/15/2016   Spondylolisthesis of lumbar region 02/28/2015   Hyperlipidemia    Lumbar disc disease    Hypertension    CAD (coronary artery disease) 05/19/2012   GERD (gastroesophageal reflux disease)    Depression with anxiety    Chronic asthma 05/17/2011   Allergic rhinitis due to pollen 08/08/2010   History of benign positional vertigo    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 Reports he had a near miss over the weekend, did not have his RW and lost his balance to the R  side. HEP is going okay.   Pt accompanied by:  Wife, [name]  PERTINENT HISTORY: Pt falls frequently, always in forward direction and insists on using back stairs despite frequent falls over top step. Started Sinemet week of 11/04/22  PAIN:  Are you having pain? No  PRECAUTIONS: Fall  WEIGHT BEARING RESTRICTIONS: No  FALLS: Has patient fallen in last 6 months? Yes. Number of falls "more than I can count"  LIVING ENVIRONMENT: Lives with: lives with their spouse Lives in: House/apartment Stairs: Yes: External: 3 in front and 5 in back steps; on right going up, on left going up, can reach both, and always uses back steps (where he has the most falls) Has following equipment at home: Single point cane  PLOF: Independent  PATIENT GOALS: "I want to be in the next Olympics" "I would like to walk with minimal pain and get up and down with no assistance"  OBJECTIVE:   COGNITION: Overall cognitive status: Impaired   SENSATION: Denies numbness/tingling in BUE/BLEs  COORDINATION: Heel to shin test: WNL bilaterally   EDEMA: Frequent swelling in R ankle    POSTURE: rounded shoulders, forward head, and left pelvic obliquity  LOWER EXTREMITY MMT:  Tested in seated position   MMT Right Eval Left Eval  Hip flexion 4+ 5  Hip extension    Hip abduction 4 4  Hip adduction 5 5  Hip internal rotation    Hip external rotation    Knee flexion 5 5  Knee extension 5 5  Ankle dorsiflexion 3 5  Ankle plantarflexion    Ankle inversion    Ankle eversion    (Blank rows = not tested)   TODAY'S TREATMENT:   NMR In // bars, 6 Blaze pods on random reach setting for improved turns, LE coordination and freezing strategies.  Performed on 2 minute intervals with 2-3 minute rest periods.  Pt requires CGA-min A guarding. Round 1:  circle setup.  26 hits. Max verbal cues for pt to fully turn to tap pods rather than tap laterally/posteriorly, but pt unable to follow cues. Educated pt on proper LE  sequencing w/turns (leading w/LLE when turning to L and vice versa), which pt able to teach back intermittently throughout session  Round 2:  circle setup.  21 hits. Noted improved turning sequencing this date  Round 3:  on airex, circle setup.  17 hits. Min A needed due to lateral instability.  Notable errors/deficits:  decreased step clearance w/RLE, requiring cues to avoid sliding RLE on ground. Noted minor freezing throughout activity, more so w/increased velocity of turns   Ther Act  Educated pt on freezing strategies (stop, weightshift, take step) and encouraged pt to be more observant as to where and when his freezing occurs to better identify triggers. Reviewed proper LE sequencing when turning to avoid crossing feet and turning too quickly. Pt verbalized understanding.     TRANSFERS: Assistive device utilized: None  Sit to stand: SBA Stand to sit: SBA  PATIENT EDUCATION: Education details: Use of RW at all times, continue HEP  Person educated: Patient and Spouse Education method: Programmer, multimedia, Facilities manager, Verbal cues, and Handouts Education comprehension: verbalized understanding, returned demonstration, verbal cues required, and needs further education  HOME EXERCISE PROGRAM: Seated PWR Up/Step  Standing PWR Rock/Step  Access Code: [email or web address] URL: [email or web address] Date: 11/20/2022  Prepared by: Sherlie Ban  Exercises - Sit to Stand with Armchair  - 1-2 x daily - 5 x weekly - 2 sets - 10 reps  GOALS: Goals reviewed with patient? Yes  SHORT TERM GOALS: Target date: 12/13/2022   Pt will be independent with initial HEP for improved strength, balance, transfers and gait.  Baseline: not established on eval  Goal status: INITIAL  2.  Pt will improve gait velocity to at least 2.33ft/s w/LRAD and CGA for improved gait efficiency and reduced fall risk   Baseline: 1.95 ft/s w/o AD and min A Goal status: INITIAL  3.  Pt will perform single sit to  stand without UE support w/CGA and no retropulsion for improved safety w/transfers and body mechanics   Baseline: mod A due to significant retropulsion  Goal status: INITIAL  4. Pt will improve TUG time to 17 seconds or less with LRAD in order to demo decrease fall risk. Baseline: 20 seconds no AD Goal status: INITIAL  5.  Pt and wife will verbalize fall prevention techniques for home and community setting for reduced fall frequency and improved safety.  Baseline: Pt falling multiple times per week Goal status: INITIAL  6.  Pt will trial various assistive devices in clinic to determine safest option for mobility  Baseline: not using AD - has SPC Goal status: INITIAL    LONG TERM GOALS: Target date: 01/10/2023  Pt will be independent with final HEP for improved strength, balance, transfers and gait.  Baseline:  Goal status: INITIAL  2.  Pt will improve gait velocity to at least 2.6 ft/s w/LRAD and SBA for improved gait efficiency and independence   Baseline: 1.95 ft/s w/o AD and min A Goal status: INITIAL  3.  Pt will improve TUG time to 15 seconds or less with LRAD and supervision  in order to demo decrease fall risk. Baseline: 20 seconds with no AD, CGA Goal status: INITIAL  4.  Pt will improve 5 x STS to less than or equal to 15 seconds w/good posture and form to demonstrate improved functional strength and transfer efficiency.   Baseline: 15.41s w/BUE support, significant posterior lean and flexed posture  Goal status: INITIAL  5.  Pt will trial various bracing options on RLE to reduce foot slap and fall frequency.  Baseline:  Goal status: INITIAL   ASSESSMENT:  CLINICAL IMPRESSION: Emphasis of skilled PT session on LE coordination, turns, and education on freezing strategies. Pt reports he had a few instances of freezing this past weekend and noted minor freezing during session. Pt reports feeling unstable w/turns, so majority of session spent reviewing proper turns  sequencing and utilizing blaze pods to practice proper form. Pt able to teach back freezing strategies and proper turn sequencing at end of session, but will benefit from review next session. Continue POC.    OBJECTIVE IMPAIRMENTS: Abnormal gait, decreased balance, decreased cognition, decreased coordination, decreased knowledge of condition, decreased knowledge of use of DME, decreased mobility, difficulty walking, decreased strength, decreased safety awareness, impaired perceived functional ability, improper body mechanics, postural dysfunction, and pain  ACTIVITY LIMITATIONS: carrying, lifting, bending, standing, squatting, stairs, transfers, bed mobility, reach over head, locomotion level, and caring for others  PARTICIPATION LIMITATIONS: medication management, interpersonal relationship, driving, shopping, community activity, and yard work  PERSONAL FACTORS: Age, Behavior pattern, Fitness, Past/current experiences, and 1 comorbidity: Vascular parkinsonism  are also affecting patient's functional outcome.   REHAB POTENTIAL: Fair due to diagnosis   CLINICAL DECISION MAKING: Evolving/moderate complexity  EVALUATION COMPLEXITY: Moderate  PLAN:  PT FREQUENCY: 2x/week  PT DURATION: 8 weeks  PLANNED INTERVENTIONS: Therapeutic exercises, Therapeutic activity, Neuromuscular re-education, Balance training, Gait training, Patient/Family education, Self Care, Joint mobilization, Stair training, Vestibular training, Canalith repositioning, Orthotic/Fit training, DME instructions, Manual therapy, and Re-evaluation  PLAN FOR NEXT SESSION: Goal assessment. Review freezing/turns. standing balance, RLE foot clearance tasks. practice gait with RW. Add to HEP for increased step length, posture as needed. Trial braces on RLE. Floor transfer    Jill Alexanders Clydell Sposito, PT, DPT 12/10/2022, 11:53 AM

## 2022-12-13 ENCOUNTER — Encounter: Payer: Self-pay | Admitting: Physical Therapy

## 2022-12-13 ENCOUNTER — Ambulatory Visit: Payer: Medicare Other

## 2022-12-13 ENCOUNTER — Ambulatory Visit: Payer: Medicare Other | Admitting: Physical Therapy

## 2022-12-13 DIAGNOSIS — R471 Dysarthria and anarthria: Secondary | ICD-10-CM

## 2022-12-13 DIAGNOSIS — R2689 Other abnormalities of gait and mobility: Secondary | ICD-10-CM

## 2022-12-13 DIAGNOSIS — R2681 Unsteadiness on feet: Secondary | ICD-10-CM

## 2022-12-13 DIAGNOSIS — R293 Abnormal posture: Secondary | ICD-10-CM

## 2022-12-13 NOTE — Therapy (Addendum)
OUTPATIENT SPEECH LANGUAGE PATHOLOGY PARKINSON'S TREATMENT   Patient Name: [name] MRN: [id number] DOB:[birth year], 77 y.o., male Today's Date: 12/13/2022  PCP: Ronnald Nian, MD REFERRING PROVIDER: Vladimir Faster, DO  END OF SESSION:  End of Session - 12/13/22 1319     Visit Number 2    Number of Visits 13    Date for SLP Re-Evaluation 01/24/23    Authorization Type UHC    Progress Note Due on Visit 10    SLP Start Time 1319    SLP Stop Time  1400    SLP Time Calculation (min) 41 min    Activity Tolerance Patient tolerated treatment well              Past Medical History:  Diagnosis Date   ADD (attention deficit disorder)    Allergic rhinitis    skin test 02/23/08 vaccine 2009; allergy shots weekly through Dr. Maple Hudson   Anemia    takes Fe -    Anginal pain (HCC)    Ankylosing spondylitis (HCC)    Anxiety    sees therapist Vernell Leep   Arthritis    Asthma    Dr. 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  Donnie Aho   CHEILECTOMY  10/16/2011   Procedure: CHEILECTOMY;  Surgeon: Sherri Rad, MD;  Location: [facility];  Service: Orthopedics;  Laterality: Right;   CHOLECYSTECTOMY     COLONOSCOPY     CORONARY ANGIOPLASTY     1995 after MI   CORONARY ANGIOPLASTY WITH STENT PLACEMENT     1999    ESOPHAGOGASTRODUODENOSCOPY  02/18/2012   Procedure: ESOPHAGOGASTRODUODENOSCOPY (EGD);  Surgeon: Florencia Reasons, MD;  Location: [facility];  Service: Endoscopy;  Laterality: N/A;   FOOT SURGERY     joint scraped, arthritis right foot   HERNIA REPAIR     LUMBAR DISC SURGERY     x2   right index finger  mass removed- arthritis     Dr. Amanda Pea   [facility]  Right 05/18/2021   medial toe consistent with callus   SKIN BIOPSY Left 05/17/2021   actinic keratosis   SPINAL CORD STIMULATOR BATTERY EXCHANGE N/A 10/11/2021   Procedure: SPINAL CORD STIMULATOR BATTERY EXCHANGE;  Surgeon: Venita Lick, MD;  Location: [facility];  Service: Orthopedics;  Laterality: N/A;  60 mins   SPINAL CORD STIMULATOR INSERTION N/A 11/21/2016  Procedure: LUMBAR SPINAL CORD STIMULATOR INSERTION;  Surgeon: Venita Lick, MD;  Location: [facility];  Service: Orthopedics;  Laterality: N/A;  Requests 2.5 hrs   TONSILLECTOMY     1953   UPPER GI ENDOSCOPY  [id number]   VASECTOMY     1980   Patient Active Problem List   Diagnosis Date Noted   OSA (obstructive sleep apnea) 11/12/2022   Parkinson's disease 11/12/2022   Snoring 10/18/2022   Peripheral vascular disease (HCC) 12/25/2021   Dizziness 12/24/2021   Leg pain, left 11/14/2021   Chronic low back pain with sciatica 11/14/2021   Spinal cord stimulator status 11/14/2021   Muscle spasm 11/14/2021   Constipation 09/11/2021   Slow transit constipation 09/11/2021   Cough 09/11/2021   Early satiety 09/11/2021   History of malignant neoplasm of colon 09/11/2021   Hoarseness 09/11/2021   Iron deficiency 09/11/2021   Personal history of colonic polyps 09/11/2021   Weight  decreased 09/11/2021   Esophageal motility disorder 09/11/2021   Gastritis 09/11/2021   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   Pain in both feet 08/10/2021   Diverticulitis 02/26/2021   Atherosclerosis of aorta (HCC) 08/30/2020   Mild concentric left ventricular hypertrophy (LVH)    Hypogonadism male    History of kidney stones    Erectile dysfunction    COPD (chronic obstructive pulmonary disease) (HCC)    Acquired trigger finger of left middle finger 04/22/2019   Acquired trigger finger of right middle finger 04/22/2019   Left ventricular dysfunction 10/12/2018   Ankylosing spondylitis (HCC) 10/09/2018   Presbycusis of both ears 04/23/2018   Mild intermittent asthma without complication 04/23/2018   Headache 01/30/2017   Chronic pain 11/21/2016   Arthritis 10/15/2016   Spondylolisthesis of lumbar region 02/28/2015   Hyperlipidemia    Lumbar disc disease    Hypertension    CAD (coronary artery disease) 05/19/2012   GERD (gastroesophageal reflux disease)    Depression with anxiety    Chronic asthma 05/17/2011   Allergic rhinitis due to pollen 08/08/2010   History of benign positional vertigo    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  Patient was in the emergency room on April 1 with mental status change.  Patient had apparently had a URI and had been taking Mucinex and had some degree of confusion with the URI/medication.  Patient had gotten in the shower and his wife went into check on him 30 minutes later and found that he had fallen.  There was apparently no syncope  and patient did not hit his head.  Patient went to the hospital and was found to be hypokalemic at 2.3.  Patient did not have MRI because of spinal cord stimulator.  Patient followed up with primary care physician, at which point they noted that patient had been having a chronic change in gait with forward leaning trunk and gait for several months (perhaps longer per pt/wife)   PAIN:  Are you having pain? No  PATIENT GOALS: "get back to singing"  OBJECTIVE:   TODAY'S TREATMENT:  12/13/22: Educated pt and wife on speech-related PD impairments with education provided on throat clear alternatives, diaphragmatic breathing (prior knowledge as singer), and targeted dysarthria program, Speak Out!, to address decline in conversational volume and hoarseness. Pt has not yet received Speak Out! Book. Targeted improving vocal quality and increasing intensity through progressively difficulty speech tasks using Speak Out! program, lesson 1. ST leads pt through exercises providing usual model prior to pt execution. usual mod-A required to achieve target dB this date, particularly to optimize breath support. Increased awareness of volume decay and decreased breath support exhibited as session progressed. Averaged this date:  Sustained "ah" 83 dB Reading 72 dB Cognitive speech task 71 dB.  Conversational sample of approx 3 minutes, pt averages upper 60s dB with occasional mod-A.   11/29/22: Collaborated with patient and spouse to generate POC and goals. Initiated training in use of intentional voice to optimize vocal intensity. Use of verbal cues and models effective in increasing volume and resolving volume decay noted as baseline. Introduced speak out program to pt and spouse.   PATIENT EDUCATION: Education details: see above.  Person educated: Patient and Spouse Education  method: Medical illustrator Education comprehension: verbalized understanding and returned demonstration  HOME EXERCISE PROGRAM: Speak Out!    GOALS: Goals reviewed with patient? Yes  SHORT TERM GOALS: Target date: 12/27/2022  Pt will complete HEP at least 1x/day given occasional min A over 2 sessions  Baseline: Goal status: IN PROGRESS  2.  Pt will meet or exceed dB targets during reading exercises with 90% accuracy with rare min-A.  Baseline:  Goal status: IN PROGRESS  3.  Pt will demonstrate SOVTE and vocal function exercises with mod-I.  Baseline:  Goal status: IN PROGRESS  LONG TERM GOALS: Target date: 01/10/2023  1.  Pt and spouse will report reduced need to repeat information at home > 1 week.  Baseline: 21.5/32 Goal status: IN PROGRESS  2.  Pt will utilize dysarthria compensations in 5-10 minute conversation and maintain WNL conversational volume (70-72 dB) given occasional min A over 2 sessions   Baseline:  Goal status: IN PROGRESS  3.  Pt will demonstrate intentional voice and flow phonation during singing exercise with min-A  Baseline:  Goal status: IN PROGRESS   4.  Pt will report improvement via PROM by d/c   Baseline: CPIB=21.5  Goal status: IN PROGRESS   ASSESSMENT:  CLINICAL IMPRESSION: Patient is a 77 y.o. M who was seen today for motor speech s/p hoarse voice and decreased volume. Patient was able to increase volume upon initiation of phrases but needed prompting to keep his volume up during long, unstructured conversation. Patient encouraged to use his "presenting" voice to speak up and out with intent. Evaluation reveals mild dysarthria. Pt's speech is c/b hoarse vocal quality, reduced vocal intensity, volume decay. Pt reports frustration with hoarse voice and states that he does not feel like he is speaking softy, however his wife endorsed improvement over Speak Out! Trials during todays session. Pt would benefit from skilled ST to address  aforementioned deficits to enhance communication efficacy.    OBJECTIVE IMPAIRMENTS: Objective impairments include dysarthria. These impairments are limiting patient from effectively communicating at home and in community.Factors affecting potential to achieve goals and functional outcome are co-morbidities and medical prognosis. Patient will benefit from skilled SLP services to address above impairments and improve overall function.  REHAB POTENTIAL: Excellent  PLAN:  SLP FREQUENCY: 1-2x/week  SLP DURATION: 8 weeks   PLANNED INTERVENTIONS: Cueing hierachy,  Oral motor exercises, SLP instruction and feedback, Compensatory strategies, and Patient/family education  Gracy Racer, CCC-SLP 12/13/2022, 1:19 PM

## 2022-12-13 NOTE — Therapy (Signed)
OUTPATIENT PHYSICAL THERAPY NEURO TREATMENT   Patient Name: [name] MRN: [id number] DOB:Aug 09, 1945, 77 y.o., male Today's Date: 12/13/2022   PCP: Ronnald Nian, MD  REFERRING PROVIDER: Vladimir Faster, DO  END OF SESSION:  PT End of Session - 12/13/22 1406     Visit Number 8    Number of Visits 17   Plus eval   Date for PT Re-Evaluation 01/10/23    Authorization Type [employer] Medicare    Progress Note Due on Visit 10    PT Start Time 1404   handoff with speech   PT Stop Time 1444    PT Time Calculation (min) 40 min    Equipment Utilized During Treatment Gait belt    Activity Tolerance Patient tolerated treatment well    Behavior During Therapy WFL for tasks assessed/performed                Past Medical History:  Diagnosis Date   ADD (attention deficit disorder)    Allergic rhinitis    skin test 02/23/08 vaccine 2009; allergy shots weekly through Dr. Maple Hudson   Anemia    takes Fe -    Anginal pain (HCC)    Ankylosing spondylitis (HCC)    Anxiety    sees therapist Vernell Leep   Arthritis    Asthma    Dr. 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 Donnie Aho   CHEILECTOMY  10/16/2011   Procedure: CHEILECTOMY;  Surgeon: Sherri Rad, MD;  Location: [facility];  Service: Orthopedics;  Laterality: Right;   CHOLECYSTECTOMY     COLONOSCOPY     CORONARY ANGIOPLASTY     1995 after MI   CORONARY ANGIOPLASTY WITH STENT PLACEMENT     1999    ESOPHAGOGASTRODUODENOSCOPY  02/18/2012   Procedure: ESOPHAGOGASTRODUODENOSCOPY (EGD);  Surgeon: Florencia Reasons, MD;  Location: [facility];  Service: Endoscopy;  Laterality: N/A;   FOOT SURGERY     joint scraped, arthritis right foot   HERNIA REPAIR     LUMBAR DISC SURGERY     x2   right index finger  mass removed- arthritis     Dr. Amanda Pea   [facility]  Right 05/18/2021   medial toe consistent with callus   SKIN BIOPSY Left 05/17/2021   actinic keratosis   SPINAL CORD STIMULATOR BATTERY EXCHANGE N/A 10/11/2021   Procedure: SPINAL CORD  STIMULATOR BATTERY EXCHANGE;  Surgeon: Venita Lick, MD;  Location: [facility] OR;  Service: Orthopedics;  Laterality: N/A;  60 mins   SPINAL CORD STIMULATOR INSERTION N/A 11/21/2016   Procedure: LUMBAR SPINAL CORD STIMULATOR INSERTION;  Surgeon: Venita Lick, MD;  Location: [facility] OR;  Service: Orthopedics;  Laterality: N/A;  Requests 2.5 hrs   TONSILLECTOMY     1953   UPPER GI ENDOSCOPY  [id number]   VASECTOMY     1980   Patient Active Problem List   Diagnosis Date Noted   OSA (obstructive sleep apnea) 11/12/2022   Parkinson's disease 11/12/2022   Snoring 10/18/2022   Peripheral vascular disease (HCC) 12/25/2021   Dizziness 12/24/2021   Leg pain, left 11/14/2021   Chronic low back pain with sciatica 11/14/2021   Spinal cord stimulator status 11/14/2021   Muscle spasm 11/14/2021   Constipation 09/11/2021   Slow transit constipation 09/11/2021   Cough 09/11/2021   Early satiety 09/11/2021   History of malignant neoplasm  of colon 09/11/2021   Hoarseness 09/11/2021   Iron deficiency 09/11/2021   Personal history of colonic polyps 09/11/2021   Weight decreased 09/11/2021   Esophageal motility disorder 09/11/2021   Gastritis 09/11/2021   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   Pain in both feet 08/10/2021   Diverticulitis 02/26/2021   Atherosclerosis of aorta (HCC) 08/30/2020   Mild concentric left ventricular hypertrophy (LVH)    Hypogonadism male    History of kidney stones    Erectile dysfunction    COPD (chronic obstructive pulmonary disease) (HCC)    Acquired trigger finger of left middle finger 04/22/2019   Acquired trigger finger of right middle finger 04/22/2019   Left ventricular dysfunction 10/12/2018   Ankylosing spondylitis (HCC) 10/09/2018   Presbycusis of both ears 04/23/2018   Mild intermittent asthma without complication 04/23/2018   Headache 01/30/2017   Chronic pain 11/21/2016   Arthritis 10/15/2016   Spondylolisthesis of lumbar region 02/28/2015   Hyperlipidemia    Lumbar disc disease    Hypertension    CAD (coronary artery disease) 05/19/2012   GERD (gastroesophageal reflux disease)    Depression with anxiety    Chronic asthma 05/17/2011   Allergic rhinitis due to pollen 08/08/2010   History of benign positional vertigo    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 No new falls. Pt reports rarely doing his exercises at home, would like to review them today.   Pt  accompanied by:  Wife, [name]  PERTINENT HISTORY: Pt falls frequently, always in forward direction and insists on using back stairs despite frequent falls over top step. Started Sinemet week of 11/04/22  PAIN:  Are you having pain? No  PRECAUTIONS: Fall  WEIGHT BEARING RESTRICTIONS: No  FALLS: Has patient fallen in last 6 months? Yes. Number of falls "more than I can count"  LIVING ENVIRONMENT: Lives with: lives with their spouse Lives in: House/apartment Stairs: Yes: External: 3 in front and 5 in back steps; on right going up, on left going up, can reach both, and always uses back steps (where he has the most falls) Has following equipment at home: Single point cane  PLOF: Independent  PATIENT GOALS: "I want to be in the next Olympics" "I would like to walk with minimal pain and get up and down with no assistance"  OBJECTIVE:   COGNITION: Overall cognitive status: Impaired   SENSATION: Denies numbness/tingling in BUE/BLEs  COORDINATION: Heel to shin test: WNL bilaterally   EDEMA: Frequent swelling in R ankle    POSTURE: rounded shoulders, forward head, and left pelvic obliquity  LOWER EXTREMITY MMT:  Tested in seated position   MMT Right Eval Left Eval  Hip flexion 4+ 5  Hip extension    Hip abduction 4 4  Hip adduction 5 5  Hip internal rotation    Hip external rotation    Knee flexion 5 5  Knee extension 5 5  Ankle dorsiflexion 3 5  Ankle plantarflexion    Ankle inversion    Ankle eversion    (Blank rows = not tested)   TODAY'S TREATMENT:    GAIT: Gait pattern: step through pattern, decreased arm swing- Right, decreased arm swing- Left, decreased step length- Right, decreased step length- Left, decreased stride length, decreased hip/knee flexion- Right, decreased hip/knee flexion- Left, decreased ankle dorsiflexion- Right, decreased ankle dorsiflexion- Left, shuffling, trunk flexed, narrow BOS, poor foot clearance- Right, and poor foot clearance-  Left Distance walked: Clinic distances  Assistive device utilized: Agricultural consultant  Level of assistance: Supervision  Comments: Cued to focus on step length and heel strike. Pt had two instances of catching his R toe, but able to maintain balance.   Ther Act  10 reps sit <> stands with no UE support from mat table, performed an additional 5 reps. Reviewed cues to scoot out towards edge, widening BOS, tucking feet under, and big effort with forward lean. Cues for tall posture and looking ahead once in standing as pt tends to look down at floor and with incr forward flexed posture. When pt performs this way, pt with no episodes of BLE bracing. Reviewed this exercise from HEP. Performed an additional x5 reps at end of session  Gait speed: 20.6 seconds with RW = 1.59 ft/sec TUG: 18.4 seconds with RW  NMR: Pt performs PWR! Moves in sitting position    PWR! Up for improved posture x20 reps, cues for scap retraction and looking ahead and holding for a few seconds    PWR! Step for improved step initiation x12 reps, cues for incr foot clearance and step height    Cues provided for technique, level of effort and purpose of each exercise. Also cued for counting out loud    Pt performs PWR! Moves in standing position at  edge of mat table with chair in front of him for balance:    PWR! Rock for improved weight shifting x10 reps each side, cued to look up at hands    PWR! Step for improved step initiation x10 reps each side, with focus on incr foot clearance, esp with RLE. Pt more fatigued at end of session    PATIENT EDUCATION: Education details: Use of RW at all times, continue HEP, results of goals, reviewed freezing strategies, reviewed HEP  Person educated: Patient and Spouse Education method: Explanation, Demonstration, and Verbal cues Education comprehension: verbalized understanding, returned demonstration, verbal cues required, and needs further education  HOME EXERCISE PROGRAM: Seated  PWR Up/Step  Standing PWR Rock/Step  Access Code: [email or web address] URL: [email or web address] Date: 11/20/2022 Prepared by: Sherlie Ban  Exercises - Sit to Stand with Armchair  - 1-2 x daily - 5 x weekly - 2 sets - 10 reps  GOALS: Goals reviewed with patient? Yes  SHORT TERM GOALS: Target date: 12/13/2022   Pt will be independent with initial HEP for improved strength, balance, transfers and gait.  Baseline: pt reports rarely performing it at home  Goal status: IN PROGRESS  2.  Pt will improve gait velocity to at least 2.59ft/s w/LRAD and CGA for improved gait efficiency and reduced fall risk   Baseline: 1.95 ft/s w/o AD and min A  20.6 seconds with RW = 1.59 ft/sec on 12/13/22, with supervision/CGA Goal status: NOT MET  3.  Pt will perform single sit to stand without UE support w/CGA and no retropulsion for improved safety w/transfers and body mechanics   Baseline: mod A due to significant retropulsion, met on 12/13/22 Goal status: MET  4. Pt will improve TUG time to 17 seconds or less with LRAD in order to demo decrease fall risk. Baseline: 20 seconds no AD  18.4 seconds with RW on 12/13/22 Goal status: NOT MET  5.  Pt and wife will verbalize fall prevention techniques for home and community setting for reduced fall frequency and improved safety.  Baseline: Pt falling multiple times per week Goal status: INITIAL  6.  Pt will trial various assistive devices in clinic to determine safest option for mobility  Baseline: pt using RW  Goal status: MET    LONG TERM GOALS: Target date: 01/10/2023  Pt will be independent with final HEP for improved strength, balance, transfers and gait.  Baseline:  Goal status: INITIAL  2.  Pt will improve gait velocity to at least 2.6 ft/s w/LRAD and SBA for improved gait efficiency and independence   Baseline: 1.95 ft/s w/o AD and min A Goal status: INITIAL  3.  Pt will improve TUG time to 15 seconds or less with LRAD and  supervision  in order to demo decrease fall risk. Baseline: 20 seconds with no AD, CGA Goal status: INITIAL  4.  Pt will improve 5 x STS to less than or equal to 15 seconds w/good posture and form to demonstrate improved functional strength and transfer efficiency.   Baseline: 15.41s w/BUE support, significant posterior lean and flexed posture  Goal status: INITIAL  5.  Pt will trial various bracing options on RLE to reduce foot slap and fall frequency.  Baseline:  Goal status: INITIAL   ASSESSMENT:  CLINICAL IMPRESSION: Today's skilled session focused on assessing pt's STGs. Pt met STG #3 and #6. Pt improved sit <> stand and can perform without retropulsion for improved safety/balance. Pt does need intermittent reminder cues to scoot towards the edge and  perform with incr forward lean. Pt met STG #6 - is consistently using a RW for improved mobility and safety with gait. Pt did not meet STGs #2 and #4 in regards to TUG and gait speed. Pt improved TUG time with RW, but not quite to goal level. Pt's gait speed with RW is slower than with no AD, but pt is demonstrating improved mechanics and not having any festination episodes. Pt also needing only supervision, compared to min A with no AD. Remainder of session focused on reviewing pt's HEP as pt had not been consistently performing at home. Will continue to progress towards LTGs.    OBJECTIVE IMPAIRMENTS: Abnormal gait, decreased balance, decreased cognition, decreased coordination, decreased knowledge of condition, decreased knowledge of use of DME, decreased mobility, difficulty walking, decreased strength, decreased safety awareness, impaired perceived functional ability, improper body mechanics, postural dysfunction, and pain  ACTIVITY LIMITATIONS: carrying, lifting, bending, standing, squatting, stairs, transfers, bed mobility, reach over head, locomotion level, and caring for others  PARTICIPATION LIMITATIONS: medication management,  interpersonal relationship, driving, shopping, community activity, and yard work  PERSONAL FACTORS: Age, Behavior pattern, Fitness, Past/current experiences, and 1 comorbidity: Vascular parkinsonism  are also affecting patient's functional outcome.   REHAB POTENTIAL: Fair due to diagnosis   CLINICAL DECISION MAKING: Evolving/moderate complexity  EVALUATION COMPLEXITY: Moderate  PLAN:  PT FREQUENCY: 2x/week  PT DURATION: 8 weeks  PLANNED INTERVENTIONS: Therapeutic exercises, Therapeutic activity, Neuromuscular re-education, Balance training, Gait training, Patient/Family education, Self Care, Joint mobilization, Stair training, Vestibular training, Canalith repositioning, Orthotic/Fit training, DME instructions, Manual therapy, and Re-evaluation  PLAN FOR NEXT SESSION: provide handout for fall prevention. Review freezing/turns. standing balance, RLE foot clearance tasks. practice gait with RW. Add to HEP for increased step length, posture as needed. Trial braces on RLE. Floor transfer    Drake Leach, PT, DPT 12/13/2022, 3:54 PM

## 2022-12-13 NOTE — Patient Instructions (Signed)
[facility] [place] Formerly known as [employer] [address]  [address], [place] [address]  [phone]   Be mindful of your throat clearing - how frequently is this occurring?  Instead,  Take a sip of water and/or  Swallow your saliva hard

## 2022-12-15 NOTE — Progress Notes (Unsigned)
Patient ID: GHAITH KATA, male    DOB: 11-05-1945, 77 y.o.   MRN: [id number]  HPI M never smoker followed for asthma, allergic rhinitis complicated by hx ADHD, CAD/ MI, GERD, anemia    --------------------------------------------------------------------------------   09/16/22- 77 yo M never smoker followed for asthma, allergic rhinitis complicated by hx ADHD, CAD/ MI, GERD, anemia, back pain/spinal stimulator Anoro, ProAir, Singulair, zyrtec, Concerta ER 54, xanax 0.25 mg bid prn,  ED 09/10/22- fall and altered mental status. Concussion. Coming to re-establish : c/o Hoarseness and not sleeping well Wife is here Complains of hoarseness, cannot saying.  Swallows without problem.  Little postnasal drip.  No dysphagia.  No heartburn recognized. During sleep wife says he talks and laughs.  Loud snoring.  He thinks he is dreaming more in the past month. Has spinal stimulator for back pain.  Takes lorazepam for sleep and if needed for itching. CXR 09/09/22- IMPRESSION: No active cardiopulmonary disease.  Borderline cardiomegaly. HRCT 08/20/19- IMPRESSION: 1. No evidence of interstitial lung disease. 2. Aortic atherosclerosis (ICD10-I70.0). Coronary artery calcification.  12/16/22- 77 yo M never smoker followed for Asthma, allergic Rhinitis complicated by hx ADHD, CAD/ MI, GERD, anemia, back pain/spinal stimulator, Vascular Parkinson's,  Anoro, ProAir, Singulair, zyrtec, Concerta ER 54, xanax 0.25 mg bid prn,  ED 09/10/22- fall and altered mental status. Concussion.  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  I explained moderately severe obstructive sleep apnea.  His wife already uses CPAP from [place] so we are going to establish him with the same.  I did briefly review alternative treatments. He has now seen Dr. Arbutus Leas for  Neurology with referral for speech therapy because of his hoarseness.  Review of Systems-see HPI + = positive Constitutional:   No-   weight loss, night sweats, fevers, chills, fatigue, lassitude. HEENT:   No-current  headaches, difficulty swallowing, tooth/dental problems, sore throat,       No-  sneezing, itching, ear ache, no-nasal congestion, post nasal drip,  CV:  No-chest pain, No- orthopnea, PND, swelling in lower extremities, anasarca, dizziness, palpitations Resp: +   shortness of breath with exertion or at rest.              No-   productive cough,   +non-productive cough,  No- coughing up of blood.              No-   change in color of mucus.  No- wheezing.   Skin: No-   rash or lesions. GI:  No-   heartburn, indigestion, abdominal pain, nausea, vomiting,  GU:  MS:  No-   joint pain or swelling. Neuro-     nothing unusual Psych:  No- change in mood or affect. No depression or anxiety.  No memory loss.     Objective:   Physical Exam General- Alert, Oriented, Affect-appropriate, Distress- none acute Skin- rash-none, lesions- none, excoriation- none Lymphadenopathy- none Head- atraumatic            Eyes- Gross vision intact, PERRLA, conjunctivae clear secretions            Ears- Hearing, canals-normal            Nose- Clear, no-Septal dev, mucus, polyps, erosion, perforation             Throat- Mallampati II , mucosa  clear, drainage- none, tonsils- atrophic. +weak voice Neck- flexible , trachea midline, +no stridor , thyroid nl, carotid no bruit Chest - symmetrical excursion , unlabored           Heart/CV- RRR , no murmur , no gallop  , no rub, nl s1 s2                           - JVD- none , edema- none, stasis changes- none, varices- none           Lung-+ clear , good airflow,wheeze- none, cough- none , dullness-none, rub- none           Chest wall- + spinal stimulator Abd- Br/ Gen/ Rectal- Not done, not indicated Extrem- Normal apparent strength and mobility Neuro-  grossly intact to observation

## 2022-12-16 ENCOUNTER — Encounter: Payer: Self-pay | Admitting: Internal Medicine

## 2022-12-16 ENCOUNTER — Ambulatory Visit (INDEPENDENT_AMBULATORY_CARE_PROVIDER_SITE_OTHER): Payer: Medicare Other | Admitting: Internal Medicine

## 2022-12-16 VITALS — BP 116/68 | HR 69 | Temp 97.7°F | Ht 63.0 in | Wt 156.0 lb

## 2022-12-16 DIAGNOSIS — J4520 Mild intermittent asthma, uncomplicated: Secondary | ICD-10-CM

## 2022-12-16 DIAGNOSIS — G4733 Obstructive sleep apnea (adult) (pediatric): Secondary | ICD-10-CM

## 2022-12-16 NOTE — Assessment & Plan Note (Signed)
Currently asymptomatic and uncomplicated.  His breathing feels normal.

## 2022-12-16 NOTE — Patient Instructions (Signed)
Order- new DME [facility] (Wife uses [facility]), new CPAP auto 5-20, mask of choice, humidifier, supplies,. Please install AirView/ card

## 2022-12-16 NOTE — Assessment & Plan Note (Signed)
Appropriate discussion. Plan-new CPAP auto 5-20 to be established through [facility] since that is his wife's DME as well.

## 2022-12-17 ENCOUNTER — Ambulatory Visit: Payer: Medicare Other | Admitting: Physical Therapy

## 2022-12-17 ENCOUNTER — Ambulatory Visit (INDEPENDENT_AMBULATORY_CARE_PROVIDER_SITE_OTHER): Payer: Medicare Other

## 2022-12-17 ENCOUNTER — Ambulatory Visit: Payer: Medicare Other | Admitting: Speech Pathology

## 2022-12-17 VITALS — BP 110/60 | HR 71 | Temp 98.1°F | Ht 66.0 in | Wt 158.6 lb

## 2022-12-17 DIAGNOSIS — R2689 Other abnormalities of gait and mobility: Secondary | ICD-10-CM

## 2022-12-17 DIAGNOSIS — R293 Abnormal posture: Secondary | ICD-10-CM

## 2022-12-17 DIAGNOSIS — R2681 Unsteadiness on feet: Secondary | ICD-10-CM

## 2022-12-17 DIAGNOSIS — R471 Dysarthria and anarthria: Secondary | ICD-10-CM

## 2022-12-17 DIAGNOSIS — Z0000 Encounter for general adult medical examination without abnormal findings: Secondary | ICD-10-CM

## 2022-12-17 DIAGNOSIS — R296 Repeated falls: Secondary | ICD-10-CM

## 2022-12-17 NOTE — Therapy (Signed)
OUTPATIENT SPEECH LANGUAGE PATHOLOGY [facility]   Patient Name: [name] MRN: [id number] DOB:[birth year], 77 y.o., male Today's Date: 12/17/2022  PCP: Ronnald Nian, MD REFERRING PROVIDER: Vladimir Faster, DO  END OF SESSION:  End of Session - 12/17/22 1151     Visit Number 3    Number of Visits 13    Date for SLP Re-Evaluation 01/24/23    Authorization Type [employer]    Progress Note Due on Visit 10    SLP Start Time 1151    SLP Stop Time  1236    SLP Time Calculation (min) 45 min    Activity Tolerance Patient tolerated treatment well               Past Medical History:  Diagnosis Date   ADD (attention deficit disorder)    Allergic rhinitis    skin test 02/23/08 vaccine 2009; allergy shots weekly through Dr. Maple Hudson   Anemia    takes Fe -    Anginal pain (HCC)    Ankylosing spondylitis (HCC)    Anxiety    sees therapist Vernell Leep   Arthritis    Asthma    Dr. 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  Donnie Aho   CHEILECTOMY  10/16/2011   Procedure: CHEILECTOMY;  Surgeon: Sherri Rad, MD;  Location: [facility];  Service: Orthopedics;  Laterality: Right;   CHOLECYSTECTOMY     COLONOSCOPY     CORONARY ANGIOPLASTY     1995 after MI   CORONARY ANGIOPLASTY WITH STENT PLACEMENT     1999    ESOPHAGOGASTRODUODENOSCOPY  02/18/2012   Procedure: ESOPHAGOGASTRODUODENOSCOPY (EGD);  Surgeon: Florencia Reasons, MD;  Location: [facility];  Service: Endoscopy;  Laterality: N/A;   FOOT SURGERY     joint scraped, arthritis right foot   HERNIA REPAIR     LUMBAR DISC SURGERY     x2   right index finger  mass removed- arthritis     Dr. Amanda Pea   [facility]  Right 05/18/2021   medial toe consistent with callus   SKIN BIOPSY Left 05/17/2021   actinic keratosis   SPINAL CORD STIMULATOR BATTERY EXCHANGE N/A 10/11/2021   Procedure: SPINAL CORD STIMULATOR BATTERY EXCHANGE;  Surgeon: Venita Lick, MD;  Location: [facility] OR;  Service: Orthopedics;  Laterality: N/A;  60 mins   SPINAL CORD STIMULATOR INSERTION N/A 11/21/2016  Procedure: LUMBAR SPINAL CORD STIMULATOR INSERTION;  Surgeon: Venita Lick, MD;  Location: [facility] OR;  Service: Orthopedics;  Laterality: N/A;  Requests 2.5 hrs   TONSILLECTOMY     1953   UPPER GI ENDOSCOPY  [id number]   VASECTOMY     1980   Patient Active Problem List   Diagnosis Date Noted   OSA (obstructive sleep apnea) 11/12/2022   Parkinson's disease 11/12/2022   Snoring 10/18/2022   Peripheral vascular disease (HCC) 12/25/2021   Dizziness 12/24/2021   Leg pain, left 11/14/2021   Chronic low back pain with sciatica 11/14/2021   Spinal cord stimulator status 11/14/2021   Muscle spasm 11/14/2021   Constipation 09/11/2021   Slow transit constipation 09/11/2021   Cough 09/11/2021   Early satiety 09/11/2021   History of malignant neoplasm of colon 09/11/2021   Hoarseness 09/11/2021   Iron deficiency 09/11/2021   Personal history of colonic polyps 09/11/2021   Weight  decreased 09/11/2021   Esophageal motility disorder 09/11/2021   Gastritis 09/11/2021   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   Pain in both feet 08/10/2021   Diverticulitis 02/26/2021   Atherosclerosis of aorta (HCC) 08/30/2020   Mild concentric left ventricular hypertrophy (LVH)    Hypogonadism male    History of kidney stones    Erectile dysfunction    COPD (chronic obstructive pulmonary disease) (HCC)    Acquired trigger finger of left middle finger 04/22/2019   Acquired trigger finger of right middle finger 04/22/2019   Left ventricular dysfunction 10/12/2018   Ankylosing spondylitis (HCC) 10/09/2018   Presbycusis of both ears 04/23/2018   Mild intermittent asthma without complication 04/23/2018   Headache 01/30/2017   Chronic pain 11/21/2016   Arthritis 10/15/2016   Spondylolisthesis of lumbar region 02/28/2015   Hyperlipidemia    Lumbar disc disease    Hypertension    CAD (coronary artery disease) 05/19/2012   GERD (gastroesophageal reflux disease)    Depression with anxiety    Chronic asthma 05/17/2011   Allergic rhinitis due to pollen 08/08/2010   History of benign positional vertigo    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  Patient was in the emergency room on April 1 with mental status change.  Patient had apparently had a URI and had been taking Mucinex and had some degree of confusion with the URI/medication.  Patient had gotten in the shower and his wife went into check on him 30 minutes later and found that he had fallen.  There was apparently  no syncope and patient did not hit his head.  Patient went to the hospital and was found to be hypokalemic at 2.3.  Patient did not have MRI because of spinal cord stimulator.  Patient followed up with primary care physician, at which point they noted that patient had been having a chronic change in gait with forward leaning trunk and gait for several months (perhaps longer per pt/wife)   PAIN:  Are you having pain? No  PATIENT GOALS: "get back to singing"  OBJECTIVE:   TODAY'S TREATMENT:  12/17/22: Patient led through lesson 2 of Speak Out! Pt states that they still haven't received their Speak Out! Book. Education on slowing down while working through warm-ups in order to create sustained sound with good, diaphragmatic, breath support. Education given on layout of [employer], and how to speak with intent. Patient continued to want to rush through the work and required frequent mod-A for reminders to slow down and speak out. Pt benefiting from "ool" for sustained phonation and glides to improve vocal quality. Conversational sample of approximately 4 minutes, with pt averages mid to high 60s with occasional mod-A. Sustained "oh" 83 dB Reading 72 dB Cognitive speech task 71 dB.   12/13/22: Educated pt and wife on speech-related PD impairments with education provided on throat clear alternatives, diaphragmatic breathing (prior knowledge as singer), and targeted dysarthria program, Speak Out!, to address decline in conversational volume and hoarseness. Pt has not yet received Speak Out! Book. Targeted improving vocal quality and increasing intensity through progressively difficulty speech tasks using Speak Out! program, lesson 1. ST leads pt through exercises providing usual model prior to pt execution. usual mod-A required to achieve target dB this date, particularly  to optimize breath support. Increased awareness of volume decay and decreased breath support exhibited as session progressed. Averaged this date:  Sustained "ah" 83 dB Reading 72 dB Cognitive speech task 71 dB.  Conversational sample of approx 3 minutes, pt averages upper 60s dB with occasional mod-A.   11/29/22: Collaborated with patient and spouse to generate POC and goals. Initiated training in use of intentional voice to optimize vocal intensity. Use of verbal cues and models effective in increasing volume and resolving volume decay noted as baseline. Introduced speak out program to pt and spouse.   PATIENT EDUCATION: Education details: see above.  Person educated: Patient and Spouse Education method: Medical illustrator Education comprehension: verbalized understanding and returned demonstration  HOME EXERCISE PROGRAM: Speak Out!    GOALS: Goals reviewed with patient? Yes  SHORT TERM GOALS: Target date: 12/27/2022  Pt will complete HEP at least 1x/day given occasional min A over 2 sessions  Baseline: Goal status: IN PROGRESS  2.  Pt will meet or exceed dB targets during reading exercises with 90% accuracy with rare min-A.  Baseline:  Goal status: IN PROGRESS  3.  Pt will demonstrate SOVTE and vocal function exercises with mod-I.  Baseline:  Goal status: IN PROGRESS  LONG TERM GOALS: Target date: 01/10/2023  1.  Pt and spouse will report reduced need to repeat information at home > 1 week.  Baseline: 21.5/32 Goal status: IN PROGRESS  2.  Pt will utilize dysarthria compensations in 5-10 minute conversation and maintain WNL conversational volume (70-72 dB) given occasional min A over 2 sessions   Baseline:  Goal status: IN PROGRESS  3.  Pt will demonstrate intentional voice and flow phonation during singing exercise with min-A  Baseline:  Goal status: IN PROGRESS   4.  Pt will report improvement via PROM by d/c   Baseline: CPIB=21.5  Goal status: IN  PROGRESS   ASSESSMENT:  CLINICAL IMPRESSION: Patient is a 77 y.o. M who was seen today for motor speech s/p hoarse voice and decreased volume. Patient was able to increase volume upon initiation of phrases but needed prompting to keep his volume up during long, unstructured conversation. Patient encouraged to use his "presenting" voice to speak up and out with intent. Evaluation reveals mild dysarthria. Pt's speech is c/b hoarse vocal quality, reduced vocal intensity,  volume decay. Pt reports frustration with hoarse voice and states that he does not feel like he is speaking softy, however his wife endorsed improvement over Speak Out! Trials during todays session. Pt would benefit from skilled ST to address aforementioned deficits to enhance communication efficacy.    OBJECTIVE IMPAIRMENTS: Objective impairments include dysarthria. These impairments are limiting patient from effectively communicating at home and in community.Factors affecting potential to achieve goals and functional outcome are co-morbidities and medical prognosis. Patient will benefit from skilled SLP services to address above impairments and improve overall function.  REHAB POTENTIAL: Excellent  PLAN:  SLP FREQUENCY: 1-2x/week  SLP DURATION: 8 weeks   PLANNED INTERVENTIONS: Cueing hierachy, Oral motor exercises, SLP instruction and feedback, Compensatory strategies, and Patient/family education  [place], Student-SLP 12/17/2022, 11:51 AM

## 2022-12-17 NOTE — Therapy (Signed)
OUTPATIENT PHYSICAL THERAPY NEURO TREATMENT   Patient Name: [name] MRN: [id number] DOB:Apr 07, 1946, 77 y.o., male Today's Date: 12/17/2022   PCP: Ronnald Nian, MD  REFERRING PROVIDER: Vladimir Faster, DO  END OF SESSION:  PT End of Session - 12/17/22 1108     Visit Number 9    Number of Visits 17   Plus eval   Date for PT Re-Evaluation 01/10/23    Authorization Type [employer]    Progress Note Due on Visit 10    PT Start Time 1105    PT Stop Time 1145    PT Time Calculation (min) 40 min    Equipment Utilized During Treatment Gait belt    Activity Tolerance Patient tolerated treatment well    Behavior During Therapy WFL for tasks assessed/performed                Past Medical History:  Diagnosis Date   ADD (attention deficit disorder)    Allergic rhinitis    skin test 02/23/08 vaccine 2009; allergy shots weekly through Dr. Maple Hudson   Anemia    takes Fe -    Anginal pain (HCC)    Ankylosing spondylitis (HCC)    Anxiety    sees therapist Vernell Leep   Arthritis    Asthma    Dr. 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 Donnie Aho   CHEILECTOMY  10/16/2011   Procedure: CHEILECTOMY;  Surgeon: Sherri Rad, MD;  Location: [facility];  Service: Orthopedics;  Laterality: Right;   CHOLECYSTECTOMY     COLONOSCOPY     CORONARY ANGIOPLASTY     1995 after MI   CORONARY ANGIOPLASTY WITH STENT PLACEMENT     1999    ESOPHAGOGASTRODUODENOSCOPY  02/18/2012   Procedure: ESOPHAGOGASTRODUODENOSCOPY (EGD);  Surgeon: Florencia Reasons, MD;  Location: [facility];  Service: Endoscopy;  Laterality: N/A;   FOOT SURGERY     joint scraped, arthritis right foot   HERNIA REPAIR     LUMBAR DISC SURGERY     x2   right index finger  mass removed- arthritis     Dr. Amanda Pea   [facility]  Right 05/18/2021   medial toe consistent with callus   SKIN BIOPSY Left 05/17/2021   actinic keratosis   SPINAL CORD STIMULATOR BATTERY EXCHANGE N/A 10/11/2021   Procedure: SPINAL CORD STIMULATOR BATTERY EXCHANGE;  Surgeon: Venita Lick, MD;  Location: [facility] OR;  Service: Orthopedics;  Laterality: N/A;  60 mins   SPINAL CORD STIMULATOR INSERTION N/A 11/21/2016   Procedure: LUMBAR SPINAL CORD STIMULATOR INSERTION;  Surgeon: Venita Lick, MD;  Location: [facility] OR;  Service: Orthopedics;  Laterality: N/A;  Requests 2.5 hrs   TONSILLECTOMY     1953   UPPER GI ENDOSCOPY  [id number]   VASECTOMY     1980   Patient Active Problem List   Diagnosis Date Noted   OSA (obstructive sleep apnea) 11/12/2022   Parkinson's disease 11/12/2022   Snoring 10/18/2022   Peripheral vascular disease (HCC) 12/25/2021   Dizziness 12/24/2021   Leg pain, left 11/14/2021   Chronic low back pain with sciatica 11/14/2021   Spinal cord stimulator status 11/14/2021   Muscle spasm 11/14/2021   Constipation 09/11/2021   Slow transit constipation 09/11/2021   Cough 09/11/2021   Early satiety 09/11/2021   History of malignant neoplasm of colon  09/11/2021   Hoarseness 09/11/2021   Iron deficiency 09/11/2021   Personal history of colonic polyps 09/11/2021   Weight decreased 09/11/2021   Esophageal motility disorder 09/11/2021   Gastritis 09/11/2021   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   Pain in both feet 08/10/2021   Diverticulitis 02/26/2021   Atherosclerosis of aorta (HCC) 08/30/2020   Mild concentric left ventricular hypertrophy (LVH)    Hypogonadism male    History of kidney stones    Erectile dysfunction    COPD (chronic obstructive pulmonary disease) (HCC)    Acquired trigger finger of left middle finger 04/22/2019   Acquired trigger finger of right middle finger 04/22/2019   Left ventricular dysfunction 10/12/2018   Ankylosing spondylitis (HCC) 10/09/2018   Presbycusis of both ears 04/23/2018   Mild intermittent asthma without complication 04/23/2018   Headache 01/30/2017   Chronic pain 11/21/2016   Arthritis 10/15/2016   Spondylolisthesis of lumbar region 02/28/2015   Hyperlipidemia    Lumbar disc disease    Hypertension    CAD (coronary artery disease) 05/19/2012   GERD (gastroesophageal reflux disease)    Depression with anxiety    Chronic asthma 05/17/2011   Allergic rhinitis due to pollen 08/08/2010   History of benign positional vertigo    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 Have tried the exercises a little bit at home.   Pt accompanied by:  Wife, [name]  PERTINENT HISTORY: Pt falls  frequently, always in forward direction and insists on using back stairs despite frequent falls over top step. Started Sinemet week of 11/04/22  PAIN:  Are you having pain? No  PRECAUTIONS: Fall  WEIGHT BEARING RESTRICTIONS: No  FALLS: Has patient fallen in last 6 months? Yes. Number of falls "more than I can count"  LIVING ENVIRONMENT: Lives with: lives with their spouse Lives in: House/apartment Stairs: Yes: External: 3 in front and 5 in back steps; on right going up, on left going up, can reach both, and always uses back steps (where he has the most falls) Has following equipment at home: Single point cane  PLOF: Independent  PATIENT GOALS: "I want to be in the next Olympics" "I would like to walk with minimal pain and get up and down with no assistance"  OBJECTIVE:   COGNITION: Overall cognitive status: Impaired   SENSATION: Denies numbness/tingling in BUE/BLEs  COORDINATION: Heel to shin test: WNL bilaterally   EDEMA: Frequent swelling in R ankle    POSTURE: rounded shoulders, forward head, and left pelvic obliquity  LOWER EXTREMITY MMT:  Tested in seated position   MMT Right Eval Left Eval  Hip flexion 4+ 5  Hip extension    Hip abduction 4 4  Hip adduction 5 5  Hip internal rotation    Hip external rotation    Knee flexion 5 5  Knee extension 5 5  Ankle dorsiflexion 3 5  Ankle plantarflexion    Ankle inversion    Ankle eversion    (Blank rows = not tested)   TODAY'S TREATMENT:    GAIT: Gait pattern: step through pattern, decreased arm swing- Right, decreased arm swing- Left, decreased step length- Right, decreased step length- Left, decreased stride length, decreased hip/knee flexion- Right, decreased hip/knee flexion- Left, decreased ankle dorsiflexion- Right, decreased ankle dorsiflexion- Left, shuffling, trunk flexed, narrow BOS, poor foot clearance- Right, and poor foot clearance- Left Distance walked: Clinic distances  Assistive device  utilized: Agricultural consultant  Level of assistance: Supervision  Comments: Continued to focus on step length and heel strike and staying close to [employer]   NMR:  With 6 blaze pods either on 6" step or 4" step for improved foot clearance, SLS, weight shifting, reaction times, performed on single random taps, performed with no UE support: Performed on level ground, 3 bouts of 1 minute each with 30 second rest break: 26 hits, 31 hits, 33 hits Performed on air ex, 2 bouts of 1 minute each with 30 second rest break: 32 hits, 35 hits   Standing on air ex with 4# slam ball and wide BOS, cues for tall posture and raising ball overhead with elbows extending and then tossing it to ground and catching it with big amplitude, pt initially not able to catch ball due to not tossing it hard enough, with cues to work towards a 7-8/10 level of effort, pt then able to throw and catch it, performed in center 12-15 reps. Performed starting to move laterally for trunk rotation with hitting ball to colorful floor disc to R/L x12 reps each side, pt needing cues to reset in the middle with tall posture  In // bars: with step to pattern stepping over 4 smaller orange obstacles leading with LLE first and then RLE x4 reps each leg, pt needing cues for slowed  pace and deliberate foot clearance esp with RLE as pt first tends to hit obstacles and knock them over due to moving too quickly, pt improved with slowed pace , then alternating pattern stepping over down and back x4 reps with focus on continued slowed pace and foot clearance     PATIENT EDUCATION: Education details: Continue HEP, gait training with foot clearance and incr stride length Person educated: Patient and Spouse Education method: Explanation, Demonstration, and Verbal cues Education comprehension: verbalized understanding, returned demonstration, verbal cues required, and needs further education  HOME EXERCISE PROGRAM: Seated PWR Up/Step  Standing PWR  Rock/Step  Access Code: [email or web address] URL: [email or web address] Date: 11/20/2022 Prepared by: Sherlie Ban  Exercises - Sit to Stand with Armchair  - 1-2 x daily - 5 x weekly - 2 sets - 10 reps  GOALS: Goals reviewed with patient? Yes  SHORT TERM GOALS: Target date: 12/13/2022   Pt will be independent with initial HEP for improved strength, balance, transfers and gait.  Baseline: pt reports rarely performing it at home  Goal status: IN PROGRESS  2.  Pt will improve gait velocity to at least 2.78ft/s w/LRAD and CGA for improved gait efficiency and reduced fall risk   Baseline: 1.95 ft/s w/o AD and min A  20.6 seconds with RW = 1.59 ft/sec on 12/13/22, with supervision/CGA Goal status: NOT MET  3.  Pt will perform single sit to stand without UE support w/CGA and no retropulsion for improved safety w/transfers and body mechanics   Baseline: mod A due to significant retropulsion, met on 12/13/22 Goal status: MET  4. Pt will improve TUG time to 17 seconds or less with LRAD in order to demo decrease fall risk. Baseline: 20 seconds no AD  18.4 seconds with RW on 12/13/22 Goal status: NOT MET  5.  Pt and wife will verbalize fall prevention techniques for home and community setting for reduced fall frequency and improved safety.  Baseline: Pt falling multiple times per week Goal status: INITIAL  6.  Pt will trial various assistive devices in clinic to determine safest option for mobility  Baseline: pt using RW  Goal status: MET    LONG TERM GOALS: Target date: 01/10/2023  Pt will be independent with final HEP for improved strength, balance, transfers and gait.  Baseline:  Goal status: INITIAL  2.  Pt will improve gait velocity to at least 2.6 ft/s w/LRAD and SBA for improved gait efficiency and independence   Baseline: 1.95 ft/s w/o AD and min A Goal status: INITIAL  3.  Pt will improve TUG time to 15 seconds or less with LRAD and supervision  in order to demo  decrease fall risk. Baseline: 20 seconds with no AD, CGA Goal status: INITIAL  4.  Pt will improve 5 x STS to less than or equal to 15 seconds w/good posture and form to demonstrate improved functional strength and transfer efficiency.   Baseline: 15.41s w/BUE support, significant posterior lean and flexed posture  Goal status: INITIAL  5.  Pt will trial various bracing options on RLE to reduce foot slap and fall frequency.  Baseline:  Goal status: INITIAL   ASSESSMENT:  CLINICAL IMPRESSION: Today's skilled session focused on balance strategies with foot clearance tasks (esp with RLE), SLS, posture, and large amplitude movements. With blaze pod activities on elevated surfaces and when stepping over obstacles, needing cues to slow down and focus on deliberate foot clearance, esp with RLE. Pt does better with his balance when he slows  down. Will continue to progress towards LTGs.    OBJECTIVE IMPAIRMENTS: Abnormal gait, decreased balance, decreased cognition, decreased coordination, decreased knowledge of condition, decreased knowledge of use of DME, decreased mobility, difficulty walking, decreased strength, decreased safety awareness, impaired perceived functional ability, improper body mechanics, postural dysfunction, and pain  ACTIVITY LIMITATIONS: carrying, lifting, bending, standing, squatting, stairs, transfers, bed mobility, reach over head, locomotion level, and caring for others  PARTICIPATION LIMITATIONS: medication management, interpersonal relationship, driving, shopping, community activity, and yard work  PERSONAL FACTORS: Age, Behavior pattern, Fitness, Past/current experiences, and 1 comorbidity: Vascular parkinsonism  are also affecting patient's functional outcome.   REHAB POTENTIAL: Fair due to diagnosis   CLINICAL DECISION MAKING: Evolving/moderate complexity  EVALUATION COMPLEXITY: Moderate  PLAN:  PT FREQUENCY: 2x/week  PT DURATION: 8 weeks  PLANNED  INTERVENTIONS: Therapeutic exercises, Therapeutic activity, Neuromuscular re-education, Balance training, Gait training, Patient/Family education, Self Care, Joint mobilization, Stair training, Vestibular training, Canalith repositioning, Orthotic/Fit training, DME instructions, Manual therapy, and Re-evaluation  PLAN FOR NEXT SESSION: will need 10th visit PN.   provide handout for fall prevention. Review freezing/turns. standing balance, RLE foot clearance tasks. practice gait with RW. Add to HEP for increased step length, posture as needed. Trial braces on RLE. Floor transfer    Drake Leach, PT, DPT 12/17/2022, 12:21 PM

## 2022-12-17 NOTE — Progress Notes (Signed)
Subjective:   [name] is a 77 y.o. male who presents for Medicare Annual/Subsequent preventive examination.  Visit Complete: In person    Review of Systems     Cardiac Risk Factors include: advanced age (>67men, >83 women);male gender;dyslipidemia;hypertension     Objective:    Today's Vitals   12/17/22 0925  BP: 110/60  Pulse: 71  Temp: 98.1 F (36.7 C)  TempSrc: Oral  SpO2: 96%  Weight: 158 lb 9.6 oz (71.9 kg)  Height: 5\' 6"  (1.676 m)   Body mass index is 25.6 kg/m.     12/17/2022    9:35 AM 11/29/2022   11:09 AM 11/15/2022   11:15 AM 11/06/2022   10:17 AM 10/21/2022    8:34 PM 09/09/2022    8:33 PM 12/07/2021   11:34 AM  Advanced Directives  Does Patient Have a Medical Advance Directive? Yes Yes Yes Yes Yes Yes Yes  Type of Estate agent of [place];Living will Living will Living will Living will Healthcare Power of [place];Living will Healthcare Power of [place];Living will Healthcare Power of [place];Living will  Does patient want to make changes to medical advance directive?     No - Patient declined    Copy of Healthcare Power of Attorney in Chart? Yes - validated most recent copy scanned in chart (See row information)    No - copy requested  Yes - validated most recent copy scanned in chart (See row information)    Current Medications (verified) Outpatient Encounter Medications as of 12/17/2022  Medication Sig   acetaminophen (TYLENOL) 500 MG tablet Take 1,000 mg by mouth every 6 (six) hours as needed.   ALPRAZolam (XANAX) 0.25 MG tablet TAKE 1 TABLET BY MOUTH 2 TIMES DAILY AS NEEDED.   amoxicillin (AMOXIL) 500 MG capsule TAKE 4 CAPSULES BY MOUTH 1 HOUR PRIOR TO DENTAL APPOINTMENT   azelastine (ASTELIN) 0.1 % nasal spray Place 2 sprays into both nostrils 2 (two) times daily. Use in each nostril as directed   buPROPion (WELLBUTRIN XL) 300 MG 24 hr tablet TAKE 1 TABLET BY MOUTH DAILY   calcium carbonate (OS-CAL) 600 MG TABS tablet  Take 600 mg by mouth daily with breakfast.   carbidopa-levodopa (SINEMET IR) 25-100 MG tablet Take 1 tablet by mouth 3 (three) times daily. 8am/noon/4pm   cetirizine (ZYRTEC) 10 MG tablet Take 10 mg by mouth daily.   clopidogrel (PLAVIX) 75 MG tablet Take 1 tablet (75 mg total) by mouth daily.   desvenlafaxine (PRISTIQ) 50 MG 24 hr tablet TAKE 1 TABLET BY MOUTH  DAILY   diltiazem (CARDIZEM CD) 240 MG 24 hr capsule Take 1 capsule (240 mg total) by mouth daily.   ferrous sulfate 325 (65 FE) MG tablet Take 325 mg by mouth daily.   Flaxseed, Linseed, (FLAX SEED OIL) 1000 MG CAPS Take 1,000 mg by mouth every 7 (seven) days.   GLUCOSAMINE CHONDROITIN COMPLX PO Take 1 tablet by mouth daily.   guaiFENesin (MUCINEX) 600 MG 12 hr tablet Take 600 mg by mouth 2 (two) times daily as needed for cough.   meclizine (ANTIVERT) 25 MG tablet 1 twice a day as needed for dizziness   methylphenidate (CONCERTA) 54 MG PO CR tablet Take 1 tablet (54 mg total) by mouth every morning.   Multiple Vitamins-Minerals (MULTIVITAMIN WITH MINERALS) tablet Take 1 tablet by mouth daily.   pantoprazole (PROTONIX) 40 MG tablet TAKE 1 TABLET BY MOUTH EVERY DAY   PROAIR HFA 108 (90 Base) MCG/ACT inhaler TAKE 2  PUFFS BY MOUTH EVERY 4 HOURS AS NEEDED   umeclidinium-vilanterol (ANORO ELLIPTA) 62.5-25 MCG/INH AEPB INHALE 1 PUFF BY MOUTH EVERY DAY   sucralfate (CARAFATE) 1 GM/10ML suspension Take 10 mLs (1 g total) by mouth daily. (Patient not taking: Reported on 12/17/2022)   No facility-administered encounter medications on file as of 12/17/2022.    Allergies (verified) Patient has no active allergies.   History: Past Medical History:  Diagnosis Date   ADD (attention deficit disorder)    Allergic rhinitis    skin test 02/23/08 vaccine 2009; allergy shots weekly through Dr. Maple Hudson   Anemia    takes Fe -    Anginal pain (HCC)    Ankylosing spondylitis (HCC)    Anxiety    sees therapist Vernell Leep   Arthritis    Asthma    Dr.  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 Donnie Aho   CHEILECTOMY  10/16/2011   Procedure: CHEILECTOMY;  Surgeon: Sherri Rad, MD;  Location: [facility];  Service: Orthopedics;  Laterality: Right;   CHOLECYSTECTOMY     COLONOSCOPY     CORONARY ANGIOPLASTY     1995 after MI   CORONARY ANGIOPLASTY WITH STENT PLACEMENT     1999    ESOPHAGOGASTRODUODENOSCOPY  02/18/2012   Procedure: ESOPHAGOGASTRODUODENOSCOPY (EGD);  Surgeon: Florencia Reasons, MD;  Location: [facility];  Service: Endoscopy;  Laterality: N/A;   FOOT SURGERY     joint scraped, arthritis right foot   HERNIA REPAIR     LUMBAR DISC SURGERY     x2   right index finger  mass removed- arthritis     Dr. Amanda Pea   [facility]  Right 05/18/2021   medial toe consistent with callus   SKIN BIOPSY Left 05/17/2021   actinic keratosis   SPINAL CORD STIMULATOR BATTERY EXCHANGE N/A 10/11/2021   Procedure: SPINAL CORD STIMULATOR BATTERY EXCHANGE;  Surgeon: Venita Lick, MD;  Location:  [facility] OR;  Service: Orthopedics;  Laterality: N/A;  60 mins   SPINAL CORD STIMULATOR INSERTION N/A 11/21/2016   Procedure: LUMBAR SPINAL CORD STIMULATOR INSERTION;  Surgeon: Venita Lick, MD;  Location: [facility] OR;  Service: Orthopedics;  Laterality: N/A;  Requests 2.5 hrs   TONSILLECTOMY     1953   UPPER GI ENDOSCOPY  [id number]   VASECTOMY     1980   Family History  Problem Relation Age of  Onset   Diabetes Mother    Dementia Mother    Heart disease Mother    Parkinson's disease Mother    Heart disease Father    Dementia Father    Fibromyalgia Daughter    Cancer Neg Hx    Social History   Socioeconomic History   Marital status: Married    Spouse name: Not on file   Number of children: Not on file   Years of education: Not on file   Highest education level: Not on file  Occupational History   Occupation: choir singer   Occupation: retired    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   Right handed    Artist (paints)   Social Determinants of Health   Financial Resource Strain: Low Risk  (12/17/2022)   Overall Financial Resource Strain (CARDIA)    Difficulty of Paying Living Expenses: Not hard at all  Food Insecurity: No Food Insecurity (12/17/2022)   Hunger Vital Sign    Worried About Running Out of Food in the Last Year: Never true    Ran Out of Food in the Last Year: Never true  Transportation Needs: No Transportation Needs (12/17/2022)   PRAPARE - Administrator, Civil Service (Medical): No    Lack of Transportation (Non-Medical): No  Physical Activity:  Inactive (12/17/2022)   Exercise Vital Sign    Days of Exercise per Week: 0 days    Minutes of Exercise per Session: 0 min  Stress: No Stress Concern Present (12/17/2022)   [employer] of Occupational Health - Occupational Stress Questionnaire    Feeling of Stress : Not at all  Social Connections: Socially Integrated (12/17/2022)   Social Connection and Isolation Panel [NHANES]    Frequency of Communication with Friends and Family: More than three times a week    Frequency of Social Gatherings with Friends and Family: Once a week    Attends Religious Services: More than 4 times per year    Active Member of [employer] or Organizations: Yes    Attends Engineer, structural: More than 4 times per year    Marital Status: Married    Tobacco Counseling Counseling given: Not Answered   Clinical Intake:  Pre-visit preparation completed: Yes  Pain : No/denies pain     Nutritional Status: BMI 25 -29 Overweight Nutritional Risks: None Diabetes: No  How often do you need to have someone help you when you read instructions, pamphlets, or other written materials from your doctor or pharmacy?: 1 - Never  Interpreter Needed?: No  Information entered by :: NAllen LPN   Activities of Daily Living    12/17/2022    9:26 AM  In your present state of health, do you have any difficulty performing the following activities:  Hearing? 1  Comment has hearing aids  Vision? 0  Difficulty concentrating or making decisions? 1  Walking or climbing stairs? 1  Dressing or bathing? 0  Doing errands, shopping? 1  Preparing Food and eating ? N  Using the  Toilet? N  In the past six months, have you accidently leaked urine? N  Do you have problems with loss of bowel control? N  Managing your Medications? N  Managing your Finances? N  Housekeeping or managing your Housekeeping? Y    Patient Care Team: Ronnald Nian, MD as PCP - General (Family Medicine) Baldo Daub, MD as PCP -  Cardiology (Cardiology) Othella Boyer, MD as Consulting Physician (Cardiology) Waymon Budge, MD as Consulting Physician (Pulmonary Disease)  Indicate any recent Medical Services you may have received from other than [facility] providers in the past year (date may be approximate).     Assessment:   This is a routine wellness examination for [place].  Hearing/Vision screen Hearing Screening - Comments:: Has hearing aids that are maintained Vision Screening - Comments:: Regular eye exams, [facility]  Dietary issues and exercise activities discussed:     Goals Addressed             This Visit's Progress    Patient Stated       12/17/2022, wants to get rid of walker and be more careful with stairs       Depression Screen    12/17/2022    9:38 AM 05/15/2022    9:12 AM 12/07/2021   11:37 AM 12/04/2020    2:01 PM 08/17/2019    9:25 AM 04/23/2018   10:27 AM 02/02/2018    2:52 PM  PHQ 2/9 Scores  PHQ - 2 Score 0 1 0 0 0 0 0  PHQ- 9 Score 0 3 1 1        Fall Risk    12/17/2022    9:35 AM 11/06/2022   10:17 AM 07/09/2022   11:07 AM 12/07/2021   11:36 AM 12/04/2020    2:00 PM  Fall Risk   Falls in the past year? 1 1 1 1 1   Comment fell in shower, missed step   rushing and not paying attention   Number falls in past yr: 1 1 1 1 1   Injury with Fall? 0 1 0 0 0  Risk for fall due to : History of fall(s);Impaired balance/gait;Impaired mobility;Medication side effect  History of fall(s) History of fall(s);Medication side effect;Impaired mobility;Impaired balance/gait Impaired balance/gait;Impaired mobility  Follow up Falls prevention discussed;Falls evaluation completed Falls evaluation completed Falls evaluation completed;Falls prevention discussed  Falls evaluation completed    MEDICARE RISK AT HOME:  Medicare Risk at Home - 12/17/22 0937     Any stairs in or around the home? Yes    If so, are there any without handrails? No    Home free of loose throw rugs in walkways, pet  beds, electrical cords, etc? Yes    Adequate lighting in your home to reduce risk of falls? Yes    Life alert? No    Use of a cane, walker or w/c? Yes    Grab bars in the bathroom? Yes    Shower chair or bench in shower? Yes    Elevated toilet seat or a handicapped toilet? Yes             TIMED UP AND GO:  Was the test performed?  Yes  Length of time to ambulate 10 feet: 7 sec Gait slow and steady with assistive device    Cognitive Function:        12/17/2022    9:39 AM 12/07/2021   11:41 AM  6CIT Screen  What Year? 4 points 0 points  What month? 0 points 0 points  What time? 0 points 0 points  Count back from 20 0 points 0 points  Months in reverse 2 points 0 points  Repeat phrase 6 points 0 points  Total Score 12 points 0 points    Immunizations Immunization History  Administered Date(s) Administered   COVID-19, mRNA, vaccine(Comirnaty)12 years and older 05/15/2022   Fluad Quad(high Dose 65+) 04/14/2020, 03/13/2021, 05/15/2022   Influenza Split 04/23/2011   Influenza Whole 03/20/2009, 03/19/2010   Influenza, High Dose Seasonal PF 04/12/2016, 02/26/2017, 04/02/2018, 01/30/2019, 01/30/2019   Influenza,inj,Quad PF,6+ Mos 03/05/2013, 02/23/2014, 04/21/2015   Influenza-Unspecified 04/02/2018, 01/30/2019   PFIZER Comirnaty(Gray Top)Covid-19 Tri-Sucrose Vaccine 06/22/2020   PFIZER(Purple Top)SARS-COV-2 Vaccination 07/02/2019, 07/23/2019   Pfizer Covid-19 Vaccine Bivalent Booster 27yrs & up 03/13/2021   Pneumococcal Conjugate-13 03/04/2014   Pneumococcal Polysaccharide-23 06/10/2005, 05/19/2012   Td 10/20/2006   Tdap 10/20/2006, 02/26/2017   Zoster Recombinant(Shingrix) 12/10/2016, 07/15/2017   Zoster, Live 01/01/2007    TDAP status: Up to date  Flu Vaccine status: Up to date  Pneumococcal vaccine status: Up to date  Covid-19 vaccine status: Completed vaccines  Qualifies for Shingles Vaccine? Yes   Zostavax completed Yes   Shingrix Completed?:  Yes  Screening Tests Health Maintenance  Topic Date Due   INFLUENZA VACCINE  01/09/2023   Medicare Annual Wellness (AWV)  12/17/2023   DTaP/Tdap/Td (4 - Td or Tdap) 02/27/2027   Pneumonia Vaccine 47+ Years old  Completed   COVID-19 Vaccine  Completed   Hepatitis C Screening  Completed   Zoster Vaccines- Shingrix  Completed   HPV VACCINES  Aged Out   Colonoscopy  Discontinued    Health Maintenance  There are no preventive care reminders to display for this patient.   Colorectal cancer screening: No longer required.   Lung Cancer Screening: (Low Dose CT Chest recommended if Age 33-80 years, 20 pack-year currently smoking OR have quit w/in 15years.) does not qualify.   Lung Cancer Screening Referral: no  Additional Screening:  Hepatitis C Screening: does qualify; Completed 10/10/2015  Vision Screening: Recommended annual ophthalmology exams for early detection of glaucoma and other disorders of the eye. Is the patient up to date with their annual eye exam?  Yes  Who is the provider or what is the name of the office in which the patient attends annual eye exams? [facility] If pt is not established with a provider, would they like to be referred to a provider to establish care? No .   Dental Screening: Recommended annual dental exams for proper oral hygiene  Diabetic Foot Exam: n/a  Community Resource Referral / Chronic Care Management: CRR required this visit?  No   CCM required this visit?  No     Plan:     I have personally reviewed and noted the following in the patient's chart:   Medical and social history Use of alcohol, tobacco or illicit drugs  Current medications and supplements including opioid prescriptions. Patient is not currently taking opioid prescriptions. Functional ability and status Nutritional status Physical activity Advanced directives List of other physicians Hospitalizations, surgeries, and ER visits in previous 12  months Vitals Screenings to include cognitive, depression, and falls Referrals and appointments  In addition, I have reviewed and discussed with patient certain preventive protocols, quality metrics, and best practice recommendations. A written personalized care plan for preventive services as well as general preventive health recommendations were provided to patient.     Barb Merino, LPN  12/17/2022   After Visit Summary: in person  Nurse Notes:  6 CIT 12. Wife says memory has been getting worse. Has appointment with you on the 24th.

## 2022-12-17 NOTE — Patient Instructions (Signed)
[name] , Thank you for taking time to come for your Medicare Wellness Visit. I appreciate your ongoing commitment to your health goals. Please review the following plan we discussed and let me know if I can assist you in the future.   These are the goals we discussed:  Goals      Patient Stated     12/07/2021, no goals     Patient Stated     12/17/2022, wants to get rid of walker and be more careful with stairs        This is a list of the screening recommended for you and due dates:  Health Maintenance  Topic Date Due   Flu Shot  01/09/2023   Medicare Annual Wellness Visit  12/17/2023   DTaP/Tdap/Td vaccine (4 - Td or Tdap) 02/27/2027   Pneumonia Vaccine  Completed   COVID-19 Vaccine  Completed   Hepatitis C Screening  Completed   Zoster (Shingles) Vaccine  Completed   HPV Vaccine  Aged Out   Colon Cancer Screening  Discontinued    Advanced directives: copy in chart  Conditions/risks identified: none  Next appointment: Follow up in one year for your annual wellness visit.   Preventive Care 50 Years and Older, Male  Preventive care refers to lifestyle choices and visits with your health care provider that can promote health and wellness. What does preventive care include? A yearly physical exam. This is also called an annual well check. Dental exams once or twice a year. Routine eye exams. Ask your health care provider how often you should have your eyes checked. Personal lifestyle choices, including: Daily care of your teeth and gums. Regular physical activity. Eating a healthy diet. Avoiding tobacco and drug use. Limiting alcohol use. Practicing safe sex. Taking low doses of aspirin every day. Taking vitamin and mineral supplements as recommended by your health care provider. What happens during an annual well check? The services and screenings done by your health care provider during your annual well check will depend on your age, overall health, lifestyle  risk factors, and family history of disease. Counseling  Your health care provider may ask you questions about your: Alcohol use. Tobacco use. Drug use. Emotional well-being. Home and relationship well-being. Sexual activity. Eating habits. History of falls. Memory and ability to understand (cognition). Work and work Astronomer. Screening  You may have the following tests or measurements: Height, weight, and BMI. Blood pressure. Lipid and cholesterol levels. These may be checked every 5 years, or more frequently if you are over 27 years old. Skin check. Lung cancer screening. You may have this screening every year starting at age 63 if you have a 30-pack-year history of smoking and currently smoke or have quit within the past 15 years. Fecal occult blood test (FOBT) of the stool. You may have this test every year starting at age 62. Flexible sigmoidoscopy or colonoscopy. You may have a sigmoidoscopy every 5 years or a colonoscopy every 10 years starting at age 1. Prostate cancer screening. Recommendations will vary depending on your family history and other risks. Hepatitis C blood test. Hepatitis B blood test. Sexually transmitted disease (STD) testing. Diabetes screening. This is done by checking your blood sugar (glucose) after you have not eaten for a while (fasting). You may have this done every 1-3 years. Abdominal aortic aneurysm (AAA) screening. You may need this if you are a current or former smoker. Osteoporosis. You may be screened starting at age 61 if you are at  high risk. Talk with your health care provider about your test results, treatment options, and if necessary, the need for more tests. Vaccines  Your health care provider may recommend certain vaccines, such as: Influenza vaccine. This is recommended every year. Tetanus, diphtheria, and acellular pertussis (Tdap, Td) vaccine. You may need a Td booster every 10 years. Zoster vaccine. You may need this after age  11. Pneumococcal 13-valent conjugate (PCV13) vaccine. One dose is recommended after age 26. Pneumococcal polysaccharide (PPSV23) vaccine. One dose is recommended after age 57. Talk to your health care provider about which screenings and vaccines you need and how often you need them. This information is not intended to replace advice given to you by your health care provider. Make sure you discuss any questions you have with your health care provider. Document Released: 06/23/2015 Document Revised: 02/14/2016 Document Reviewed: 03/28/2015 Elsevier Interactive Patient Education  2017 [employer].  Fall Prevention in the Home Falls can cause injuries. They can happen to people of all ages. There are many things you can do to make your home safe and to help prevent falls. What can I do on the outside of my home? Regularly fix the edges of walkways and driveways and fix any cracks. Remove anything that might make you trip as you walk through a door, such as a raised step or threshold. Trim any bushes or trees on the path to your home. Use bright outdoor lighting. Clear any walking paths of anything that might make someone trip, such as rocks or tools. Regularly check to see if handrails are loose or broken. Make sure that both sides of any steps have handrails. Any raised decks and porches should have guardrails on the edges. Have any leaves, snow, or ice cleared regularly. Use sand or salt on walking paths during winter. Clean up any spills in your garage right away. This includes oil or grease spills. What can I do in the bathroom? Use night lights. Install grab bars by the toilet and in the tub and shower. Do not use towel bars as grab bars. Use non-skid mats or decals in the tub or shower. If you need to sit down in the shower, use a plastic, non-slip stool. Keep the floor dry. Clean up any water that spills on the floor as soon as it happens. Remove soap buildup in the tub or shower  regularly. Attach bath mats securely with double-sided non-slip rug tape. Do not have throw rugs and other things on the floor that can make you trip. What can I do in the bedroom? Use night lights. Make sure that you have a light by your bed that is easy to reach. Do not use any sheets or blankets that are too big for your bed. They should not hang down onto the floor. Have a firm chair that has side arms. You can use this for support while you get dressed. Do not have throw rugs and other things on the floor that can make you trip. What can I do in the kitchen? Clean up any spills right away. Avoid walking on wet floors. Keep items that you use a lot in easy-to-reach places. If you need to reach something above you, use a strong step stool that has a grab bar. Keep electrical cords out of the way. Do not use floor polish or wax that makes floors slippery. If you must use wax, use non-skid floor wax. Do not have throw rugs and other things on the floor that  can make you trip. What can I do with my stairs? Do not leave any items on the stairs. Make sure that there are handrails on both sides of the stairs and use them. Fix handrails that are broken or loose. Make sure that handrails are as long as the stairways. Check any carpeting to make sure that it is firmly attached to the stairs. Fix any carpet that is loose or worn. Avoid having throw rugs at the top or bottom of the stairs. If you do have throw rugs, attach them to the floor with carpet tape. Make sure that you have a light switch at the top of the stairs and the bottom of the stairs. If you do not have them, ask someone to add them for you. What else can I do to help prevent falls? Wear shoes that: Do not have high heels. Have rubber bottoms. Are comfortable and fit you well. Are closed at the toe. Do not wear sandals. If you use a stepladder: Make sure that it is fully opened. Do not climb a closed stepladder. Make sure that  both sides of the stepladder are locked into place. Ask someone to hold it for you, if possible. Clearly mark and make sure that you can see: Any grab bars or handrails. First and last steps. Where the edge of each step is. Use tools that help you move around (mobility aids) if they are needed. These include: Canes. Walkers. Scooters. Crutches. Turn on the lights when you go into a dark area. Replace any light bulbs as soon as they burn out. Set up your furniture so you have a clear path. Avoid moving your furniture around. If any of your floors are uneven, fix them. If there are any pets around you, be aware of where they are. Review your medicines with your doctor. Some medicines can make you feel dizzy. This can increase your chance of falling. Ask your doctor what other things that you can do to help prevent falls. This information is not intended to replace advice given to you by your health care provider. Make sure you discuss any questions you have with your health care provider. Document Released: 03/23/2009 Document Revised: 11/02/2015 Document Reviewed: 07/01/2014 Elsevier Interactive Patient Education  2017 [employer].

## 2022-12-19 NOTE — Therapy (Signed)
OUTPATIENT SPEECH LANGUAGE PATHOLOGY P[facility]INSON'S TREATMENT   Patient Name: [name] MRN: [id number] DOB:07/28/1945, 77 y.o., male Today's Date: 12/20/2022  PCP: Ronnald Nian, MD REFERRING PROVIDER: Vladimir Faster, DO  END OF SESSION:  End of Session - 12/20/22 1014     Visit Number 4    Number of Visits 13    Date for SLP Re-Evaluation 01/24/23    Authorization Type UHC    SLP Start Time 1015    SLP Stop Time  1100    SLP Time Calculation (min) 45 min    Activity Tolerance Patient tolerated treatment well                Past Medical History:  Diagnosis Date   ADD (attention deficit disorder)    Allergic rhinitis    skin test 02/23/08 vaccine 2009; allergy shots weekly through Dr. Maple Hudson   Anemia    takes Fe -    Anginal pain (HCC)    Ankylosing spondylitis (HCC)    Anxiety    sees therapist Vernell Leep   Arthritis    Asthma    Dr. 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 Donnie Aho   CHEILECTOMY  10/16/2011    Procedure: CHEILECTOMY;  Surgeon: Sherri Rad, MD;  Location: [facility];  Service: Orthopedics;  Laterality: Right;   CHOLECYSTECTOMY     COLONOSCOPY     CORONARY ANGIOPLASTY     1995 after MI   CORONARY ANGIOPLASTY WITH STENT PLACEMENT     1999    ESOPHAGOGASTRODUODENOSCOPY  02/18/2012   Procedure: ESOPHAGOGASTRODUODENOSCOPY (EGD);  Surgeon: Florencia Reasons, MD;  Location: [facility];  Service: Endoscopy;  Laterality: N/A;   FOOT SURGERY     joint scraped, arthritis right foot   HERNIA REPAIR     LUMBAR DISC SURGERY     x2   right index finger  mass removed- arthritis     Dr. Amanda Pea   [facility]  Right 05/18/2021   medial toe consistent with callus   SKIN BIOPSY Left 05/17/2021   actinic keratosis   SPINAL CORD STIMULATOR BATTERY EXCHANGE N/A 10/11/2021   Procedure: SPINAL CORD STIMULATOR BATTERY EXCHANGE;  Surgeon: Venita Lick, MD;  Location: [facility];  Service: Orthopedics;  Laterality: N/A;  60 mins   SPINAL CORD STIMULATOR INSERTION N/A 11/21/2016   Procedure: LUMBAR SPINAL CORD STIMULATOR INSERTION;  Surgeon: Venita Lick, MD;  Location: [facility];  Service: Orthopedics;  Laterality: N/A;  Requests 2.5 hrs   TONSILLECTOMY     1953   UPPER GI ENDOSCOPY  [id number]   VASECTOMY     1980   Patient Active Problem List   Diagnosis Date Noted   OSA (obstructive sleep apnea) 11/12/2022   Parkinson's disease 11/12/2022   Snoring 10/18/2022   Peripheral vascular disease (HCC) 12/25/2021   Dizziness 12/24/2021   Leg pain, left 11/14/2021   Chronic low back pain with sciatica 11/14/2021   Spinal cord stimulator status 11/14/2021   Muscle spasm 11/14/2021   Constipation 09/11/2021   Slow transit constipation 09/11/2021   Cough 09/11/2021   Early satiety 09/11/2021   History of malignant neoplasm of colon 09/11/2021   Hoarseness 09/11/2021   Iron deficiency 09/11/2021   Personal history of colonic polyps 09/11/2021   Weight decreased 09/11/2021   Esophageal  motility disorder 09/11/2021   Gastritis 09/11/2021   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   Pain in both feet 08/10/2021   Diverticulitis 02/26/2021   Atherosclerosis of aorta (HCC) 08/30/2020   Mild concentric left ventricular hypertrophy (LVH)    Hypogonadism male    History of kidney stones    Erectile dysfunction    COPD (chronic obstructive pulmonary disease) (HCC)    Acquired trigger finger of left middle finger 04/22/2019   Acquired trigger finger of right middle finger 04/22/2019   Left ventricular dysfunction 10/12/2018   Ankylosing spondylitis (HCC) 10/09/2018   Presbycusis of both ears 04/23/2018   Mild intermittent asthma without complication 04/23/2018   Headache 01/30/2017   Chronic pain 11/21/2016   Arthritis 10/15/2016   Spondylolisthesis of lumbar region 02/28/2015   Hyperlipidemia    Lumbar disc disease    Hypertension    CAD (coronary artery disease) 05/19/2012   GERD (gastroesophageal reflux disease)    Depression with anxiety    Chronic asthma 05/17/2011   Allergic rhinitis due to pollen 08/08/2010   History of benign positional vertigo    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  Patient was in the emergency room on April 1 with mental status change.  Patient had apparently had a URI and had been taking Mucinex and had some degree of confusion with the URI/medication.  Patient had gotten in the shower and his wife went into check on him 30 minutes later and found that he had fallen.  There was apparently no syncope and patient did not hit his head.  Patient went to the hospital and was  found to be hypokalemic at 2.3.  Patient did not have MRI because of spinal cord stimulator.  Patient followed up with primary care physician, at which point they noted that patient had been having a chronic change in gait with forward leaning trunk and gait for several months (perhaps longer per pt/wife)   PAIN:  Are you having pain? No  PATIENT GOALS: "get back to singing"  OBJECTIVE:   TODAY'S TREATMENT:  12/20/22: Entered with suboptimal conversational volume with average fading from mid to low 60s dB with usual hoarseness noted. No attempts to correct volume decay or hoarseness demonstrated. To optimize vocal intensity and clarity, SLP led pt through lesson 3 of Speak Out!  Usual mod cues required to use intentional volume, rate, and breath support to maximize performance. Conversational sample of approximately 4 minutes, with pt averages high fading to mid 60s with occasional mod-A. Sustained "oh" 81 dB Reading 72 dB   12/17/22: Patient led through lesson 2 of Speak Out! Pt states that they still haven't received their Speak Out! Book. Education on slowing down while working through warm-ups in order to create sustained sound with good, diaphragmatic, breath support. Education given on layout of [employer], and how to speak with intent. Patient continued to want to rush through the work and required frequent mod-A for reminders to slow down and speak out. Pt benefiting from "ool" for sustained phonation and glides to improve vocal quality. Conversational sample of approximately 4 minutes, with pt averages mid to high 60s with occasional mod-A. Sustained "oh" 83 dB Reading 72 dB Cognitive speech task 71 dB.   12/13/22: Educated pt and wife on speech-related PD impairments with education provided on throat clear alternatives, diaphragmatic breathing (prior  knowledge as singer), and targeted dysarthria program, Speak Out!, to address decline in conversational volume and hoarseness. Pt has not yet received Speak Out! Book. Targeted improving vocal quality and increasing intensity through progressively difficulty speech tasks using Speak Out! program, lesson 1. ST leads pt through exercises providing usual model prior to pt execution. usual mod-A required to achieve target dB this date, particularly to optimize breath support. Increased awareness of volume decay and decreased breath support exhibited as session progressed. Averaged this date:  Sustained "ah" 83 dB Reading 72 dB Cognitive speech task 71 dB.  Conversational sample of approx 3 minutes, pt averages upper 60s dB with occasional mod-A.   11/29/22: Collaborated with patient and spouse to generate POC and goals. Initiated training in use of intentional voice to optimize vocal intensity. Use of verbal cues and models effective in increasing volume and resolving volume decay noted as baseline. Introduced speak out program to pt and spouse.   PATIENT EDUCATION: Education details: see above.  Person educated: Patient and Spouse Education method: Medical illustrator Education comprehension: verbalized understanding and returned demonstration  HOME EXERCISE PROGRAM: Speak Out!    GOALS: Goals reviewed with patient? Yes  SHORT TERM GOALS: Target date: 12/27/2022  Pt will complete HEP at least 1x/day given occasional min A over 2 sessions  Baseline: Goal status: IN PROGRESS  2.  Pt will meet or exceed dB targets during reading exercises with 90% accuracy with rare min-A.  Baseline:  Goal status: IN PROGRESS  3.  Pt will demonstrate SOVTE and vocal function exercises with mod-I.  Baseline:  Goal status: IN PROGRESS  LONG TERM GOALS: Target date: 01/10/2023  1.  Pt and spouse will report reduced need to repeat information at home > 1 week.  Baseline: 21.5/32 Goal status: IN  PROGRESS  2.  Pt will utilize dysarthria compensations in 5-10 minute conversation and maintain WNL conversational volume (70-72 dB) given occasional min A over 2 sessions   Baseline:  Goal status: IN PROGRESS  3.  Pt will demonstrate intentional voice and flow phonation during singing exercise with min-A  Baseline:  Goal status: IN PROGRESS   4.  Pt will report improvement via PROM by  d/c   Baseline: CPIB=21.5  Goal status: IN PROGRESS   ASSESSMENT:  CLINICAL IMPRESSION: Patient is a 77 y.o. M who was seen today for motor speech s/p hoarse voice and decreased volume. Patient was able to increase volume upon initiation of phrases but needed prompting to keep his volume up during long, unstructured conversation. Patient encouraged to use his "presenting" voice to speak up and out with intent. Pt's speech is c/b intermittent hoarse vocal quality, reduced vocal intensity, volume decay. Conducted ongoing education and training of Speak Out! Program to maximize volume intensity and clarity with usual mod A required at this time to achieve targeted dB levels. Pt would benefit from skilled ST to address aforementioned deficits to enhance communication efficacy.    OBJECTIVE IMPAIRMENTS: Objective impairments include dysarthria. These impairments are limiting patient from effectively communicating at home and in community.Factors affecting potential to achieve goals and functional outcome are co-morbidities and medical prognosis. Patient will benefit from skilled SLP services to address above impairments and improve overall function.  REHAB POTENTIAL: Excellent  PLAN:  SLP FREQUENCY: 1-2x/week  SLP DURATION: 8 weeks   PLANNED INTERVENTIONS: Cueing hierachy, Oral motor exercises, SLP instruction and feedback, Compensatory strategies, and Patient/family education  Gracy Racer, CCC-SLP 12/20/2022, 10:14 AM

## 2022-12-20 ENCOUNTER — Ambulatory Visit: Payer: Medicare Other | Admitting: Physical Therapy

## 2022-12-20 ENCOUNTER — Encounter: Payer: Self-pay | Admitting: Physical Therapy

## 2022-12-20 ENCOUNTER — Ambulatory Visit: Payer: Medicare Other

## 2022-12-20 DIAGNOSIS — R2681 Unsteadiness on feet: Secondary | ICD-10-CM

## 2022-12-20 DIAGNOSIS — R471 Dysarthria and anarthria: Secondary | ICD-10-CM

## 2022-12-20 DIAGNOSIS — R2689 Other abnormalities of gait and mobility: Secondary | ICD-10-CM

## 2022-12-20 DIAGNOSIS — R293 Abnormal posture: Secondary | ICD-10-CM

## 2022-12-20 DIAGNOSIS — R296 Repeated falls: Secondary | ICD-10-CM

## 2022-12-20 NOTE — Therapy (Signed)
OUTPATIENT PHYSICAL THERAPY NEURO TREATMENT/10th VISIT PN   Patient Name: [name] MRN: [id number] DOB:[birth year], 77 y.o., male Today's Date: 12/20/2022   PCP: Ronnald Nian, MD  REFERRING PROVIDER: Vladimir Faster, DO  10th Visit Physical Therapy Progress Note  Dates of Reporting Period: 11/15/22 to 12/20/22    END OF SESSION:  PT End of Session - 12/20/22 1105     Visit Number 10    Number of Visits 17   Plus eval   Date for PT Re-Evaluation 01/10/23    Authorization Type [employer] Medicare    Progress Note Due on Visit 10    PT Start Time 1103    PT Stop Time 1145   3 minutes non billable due to pt using restroom   PT Time Calculation (min) 42 min    Equipment Utilized During Treatment Gait belt    Activity Tolerance Patient tolerated treatment well    Behavior During Therapy WFL for tasks assessed/performed                Past Medical History:  Diagnosis Date   ADD (attention deficit disorder)    Allergic rhinitis    skin test 02/23/08 vaccine 2009; allergy shots weekly through Dr. Maple Hudson   Anemia    takes Fe -    Anginal pain (HCC)    Ankylosing spondylitis (HCC)    Anxiety    sees therapist Vernell Leep   Arthritis    Asthma    Dr. 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 Donnie Aho   CHEILECTOMY  10/16/2011   Procedure: CHEILECTOMY;  Surgeon: Sherri Rad, MD;  Location: [facility];  Service: Orthopedics;  Laterality: Right;   CHOLECYSTECTOMY     COLONOSCOPY     CORONARY ANGIOPLASTY     1995 after MI   CORONARY ANGIOPLASTY WITH STENT PLACEMENT     1999    ESOPHAGOGASTRODUODENOSCOPY  02/18/2012   Procedure: ESOPHAGOGASTRODUODENOSCOPY (EGD);  Surgeon: Florencia Reasons, MD;  Location: [facility];  Service: Endoscopy;  Laterality: N/A;   FOOT SURGERY     joint scraped, arthritis right foot   HERNIA REPAIR     LUMBAR DISC SURGERY     x2   right index finger  mass removed- arthritis     Dr. Amanda Pea   [facility]  Right 05/18/2021   medial toe consistent with  callus   SKIN BIOPSY Left 05/17/2021   actinic keratosis   SPINAL CORD STIMULATOR BATTERY EXCHANGE N/A 10/11/2021   Procedure: SPINAL CORD STIMULATOR BATTERY EXCHANGE;  Surgeon: Venita Lick, MD;  Location: [facility];  Service: Orthopedics;  Laterality: N/A;  60 mins   SPINAL CORD STIMULATOR INSERTION N/A 11/21/2016   Procedure: LUMBAR SPINAL CORD STIMULATOR INSERTION;  Surgeon: Venita Lick, MD;  Location: [facility];  Service: Orthopedics;  Laterality: N/A;  Requests 2.5 hrs   TONSILLECTOMY     1953   UPPER GI ENDOSCOPY  [id number]   VASECTOMY     1980   Patient Active Problem List   Diagnosis Date Noted   OSA (obstructive sleep apnea) 11/12/2022   Parkinson's disease 11/12/2022   Snoring 10/18/2022   Peripheral vascular disease (HCC) 12/25/2021   Dizziness 12/24/2021   Leg pain, left 11/14/2021   Chronic low back pain with sciatica 11/14/2021   Spinal cord stimulator status 11/14/2021   Muscle spasm 11/14/2021    Constipation 09/11/2021   Slow transit constipation 09/11/2021   Cough 09/11/2021   Early satiety 09/11/2021   History of malignant neoplasm of colon 09/11/2021   Hoarseness 09/11/2021   Iron deficiency 09/11/2021   Personal history of colonic polyps 09/11/2021   Weight decreased 09/11/2021   Esophageal motility disorder 09/11/2021   Gastritis 09/11/2021   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   Pain in both feet 08/10/2021   Diverticulitis 02/26/2021   Atherosclerosis of aorta (HCC) 08/30/2020   Mild concentric left ventricular hypertrophy (LVH)    Hypogonadism male    History of kidney stones    Erectile dysfunction    COPD (chronic obstructive pulmonary disease) (HCC)    Acquired trigger finger of left middle finger 04/22/2019   Acquired trigger finger of right middle finger 04/22/2019   Left ventricular dysfunction 10/12/2018   Ankylosing spondylitis (HCC) 10/09/2018   Presbycusis of both ears 04/23/2018   Mild intermittent asthma without complication 04/23/2018   Headache 01/30/2017   Chronic pain 11/21/2016   Arthritis 10/15/2016   Spondylolisthesis of lumbar region 02/28/2015   Hyperlipidemia    Lumbar disc disease    Hypertension    CAD (coronary artery disease) 05/19/2012   GERD (gastroesophageal reflux disease)    Depression with anxiety    Chronic asthma 05/17/2011   Allergic rhinitis due to pollen 08/08/2010   History of benign positional vertigo    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   Pt accompanied by:  Wife, [name]  PERTINENT HISTORY: Pt falls frequently, always in forward direction and insists on using back stairs despite frequent falls over top step. Started Sinemet week of 11/04/22  PAIN:  Are you having pain? No  PRECAUTIONS: Fall  WEIGHT BEARING RESTRICTIONS: No  FALLS: Has patient fallen in last 6 months? Yes. Number of falls "more than I can count"  LIVING ENVIRONMENT: Lives with: lives with their spouse Lives in: House/apartment Stairs: Yes: External: 3 in front and 5 in back steps; on right going up, on left going up, can reach both, and always uses back steps (where he has the most falls) Has following equipment at home: Single point cane  PLOF: Independent  PATIENT GOALS: "I want to be in the next Olympics" "I would like to walk with minimal pain and get up and down with no assistance"  OBJECTIVE:   COGNITION: Overall cognitive status: Impaired   SENSATION: Denies numbness/tingling in BUE/BLEs  COORDINATION: Heel to shin test: WNL bilaterally   EDEMA: Frequent swelling in R ankle    POSTURE: rounded shoulders, forward head, and left pelvic obliquity  LOWER EXTREMITY MMT:  Tested in seated position   MMT Right Eval Left Eval  Hip flexion 4+ 5  Hip extension    Hip abduction 4 4  Hip adduction 5 5  Hip internal rotation    Hip external rotation    Knee flexion 5 5  Knee extension 5 5  Ankle dorsiflexion 3 5  Ankle plantarflexion    Ankle inversion    Ankle eversion    (Blank rows = not tested)   TODAY'S TREATMENT:    GAIT: Gait pattern: step through pattern, decreased arm swing- Right, decreased arm swing- Left, decreased step length- Right, decreased step length- Left, decreased stride length, decreased hip/knee flexion- Right, decreased hip/knee flexion- Left, decreased ankle dorsiflexion- Right, decreased ankle dorsiflexion- Left, shuffling, trunk  flexed, narrow BOS, poor foot clearance- Right, and poor foot clearance- Left Distance walked: Clinic distances  Assistive device utilized: Agricultural consultant  Level of assistance: Supervision  Comments: Continued to focus on step length and heel strike and staying close to RW   Provided pt with new tennis balls to RW for easier propulsion    Therapeutic Exercise SciFit [place] at [employer] 2.5 for 8 minutes, pt initially reporting effort level as 5/10 for larger amplitude movement patterns and not having much ROM. PT encouraging pt throughout for larger effort and movements. Cued to reach up to an effort level as 7/10.  NMR:  With 6 blaze pods on mirror in superior/lateral/inferior directions with pt standing on an air ex for weight shifting, trunk rotation, reaction times, and visual scanning  Performed 2 bouts of 1 minute each with reaching with ipsilateral UE: 29 hits, 26 hits Performed 3 bouts of 1 minute each  with reaching with contralateral UE for trunk rotation: 18 hits, 20 hits, 21 hits. Pt more challenged with this for balance   With multi-directional stepping sheet on mirror for pt to look ahead for more upright posture:  Following directions on sheet with large amplitude steps and naming direction of step out loud, cues for incr step length in R posterior direction. Progressed difficulty by having patient step laterally over 2" obstacle for improved foot clearance  Progressed difficulty to pt standing on air ex for foot clearance, initially pt needing to hold onto chair  for balance and progressing to no UE support, min guard/min A for balance, pt improved step amplitude with incr reps     PATIENT EDUCATION: Education details: Continue HEP, gait training with foot clearance and incr stride length Person educated: Patient and Spouse Education method: Programmer, multimedia, Demonstration, and Verbal cues Education comprehension: verbalized understanding, returned demonstration, verbal cues  required, and needs further education  HOME EXERCISE PROGRAM: Seated PWR Up/Step  Standing PWR Rock/Step  Access Code: [email or web address] URL: [email or web address] Date: 11/20/2022 Prepared by: Sherlie Ban  Exercises - Sit to Stand with Armchair  - 1-2 x daily - 5 x weekly - 2 sets - 10 reps  GOALS: Goals reviewed with patient? Yes  SHORT TERM GOALS: Target date: 12/13/2022   Pt will be independent with initial HEP for improved strength, balance, transfers and gait.  Baseline: pt reports rarely performing it at home  Goal status: IN PROGRESS  2.  Pt will improve gait velocity to at least 2.50ft/s w/LRAD and CGA for improved gait efficiency and reduced fall risk   Baseline: 1.95 ft/s w/o AD and min A  20.6 seconds with RW = 1.59 ft/sec on 12/13/22, with supervision/CGA Goal status: NOT MET  3.  Pt will perform single sit to stand without UE support w/CGA and no retropulsion for improved safety w/transfers and body mechanics   Baseline: mod A due to significant retropulsion, met on 12/13/22 Goal status: MET  4. Pt will improve TUG time to 17 seconds or less with LRAD in order to demo decrease fall risk. Baseline: 20 seconds no AD  18.4 seconds with RW on 12/13/22 Goal status: NOT MET  5.  Pt and wife will verbalize fall prevention techniques for home and community setting for reduced fall frequency and improved safety.  Baseline: Pt falling multiple times per week Goal status: INITIAL  6.  Pt will trial various assistive devices in clinic to determine safest option for mobility  Baseline: pt using RW  Goal status: MET    LONG TERM GOALS: Target date: 01/10/2023  Pt will be independent with final HEP for improved strength, balance, transfers and gait.  Baseline:  Goal status: INITIAL  2.  Pt will improve gait velocity to at least 2.6 ft/s w/LRAD and SBA for improved gait efficiency and independence   Baseline: 1.95 ft/s w/o AD and min A Goal status:  INITIAL  3.  Pt will improve TUG time to 15 seconds or less with LRAD and supervision  in order to demo decrease fall risk. Baseline: 20 seconds with no AD, CGA Goal status: INITIAL  4.  Pt will improve 5 x STS to less than or equal to 15 seconds w/good posture and form to demonstrate improved functional strength and transfer efficiency.   Baseline: 15.41s w/BUE support, significant posterior lean and flexed posture  Goal status: INITIAL  5.  Pt will trial various bracing options on RLE to reduce foot slap and fall frequency.  Baseline:  Goal status: INITIAL   ASSESSMENT:  CLINICAL IMPRESSION: 10th visit PN: STGs assessed on 12/13/22. Pt met STG #3 and #6. Pt improved sit <> stand and can perform without retropulsion for improved safety/balance. Pt does need intermittent reminder cues to scoot towards the edge and perform with incr forward lean. Pt met STG #6 - is consistently using a RW for improved mobility and safety with gait. Pt did not meet STGs #2 and #4 in regards to TUG and gait speed. Pt improved TUG time with RW, but not quite  to goal level. Pt's gait speed with RW is slower than with no AD, but pt is demonstrating improved mechanics and not having any festination episodes. Today's session focused on SciFit for large amplitude movements/neural priming, stepping strategies, and blaze pods for reactive balance/trunk rotation. Pt needing min guard/min A initially for stepping on a compliant surface due to decr foot clearance, but improved with incr reps. Will continue to progress towards LTGs.    OBJECTIVE IMPAIRMENTS: Abnormal gait, decreased balance, decreased cognition, decreased coordination, decreased knowledge of condition, decreased knowledge of use of DME, decreased mobility, difficulty walking, decreased strength, decreased safety awareness, impaired perceived functional ability, improper body mechanics, postural dysfunction, and pain  ACTIVITY LIMITATIONS: carrying, lifting,  bending, standing, squatting, stairs, transfers, bed mobility, reach over head, locomotion level, and caring for others  PARTICIPATION LIMITATIONS: medication management, interpersonal relationship, driving, shopping, community activity, and yard work  PERSONAL FACTORS: Age, Behavior pattern, Fitness, Past/current experiences, and 1 comorbidity: Vascular parkinsonism  are also affecting patient's functional outcome.   REHAB POTENTIAL: Fair due to diagnosis   CLINICAL DECISION MAKING: Evolving/moderate complexity  EVALUATION COMPLEXITY: Moderate  PLAN:  PT FREQUENCY: 2x/week  PT DURATION: 8 weeks  PLANNED INTERVENTIONS: Therapeutic exercises, Therapeutic activity, Neuromuscular re-education, Balance training, Gait training, Patient/Family education, Self Care, Joint mobilization, Stair training, Vestibular training, Canalith repositioning, Orthotic/Fit training, DME instructions, Manual therapy, and Re-evaluation  PLAN FOR NEXT SESSION: will need 10th visit PN.   provide handout for fall prevention. Review freezing/turns. standing balance, RLE foot clearance tasks. practice gait with RW. Add to HEP for increased step length, posture as needed. Trial braces on RLE. Floor transfer    Drake Leach, PT, DPT 12/20/2022, 11:57 AM

## 2022-12-24 ENCOUNTER — Ambulatory Visit: Payer: Medicare Other | Admitting: Physical Therapy

## 2022-12-24 ENCOUNTER — Encounter: Payer: Self-pay | Admitting: Physical Therapy

## 2022-12-24 DIAGNOSIS — R2689 Other abnormalities of gait and mobility: Secondary | ICD-10-CM | POA: Diagnosis not present

## 2022-12-24 DIAGNOSIS — R296 Repeated falls: Secondary | ICD-10-CM

## 2022-12-24 DIAGNOSIS — R293 Abnormal posture: Secondary | ICD-10-CM

## 2022-12-24 DIAGNOSIS — R2681 Unsteadiness on feet: Secondary | ICD-10-CM

## 2022-12-24 NOTE — Therapy (Signed)
OUTPATIENT PHYSICAL THERAPY NEURO TREATMENT   Patient Name: [name] MRN: [id number] DOB:13-Nov-1945, 77 y.o., male Today's Date: 12/24/2022   PCP: Ronnald Nian, MD  REFERRING PROVIDER: Vladimir Faster, DO  END OF SESSION:  PT End of Session - 12/24/22 1107     Visit Number 11    Number of Visits 17   Plus eval   Date for PT Re-Evaluation 01/10/23    Authorization Type [employer] Medicare    Progress Note Due on Visit 10    PT Start Time 1105    PT Stop Time 1145    PT Time Calculation (min) 40 min    Equipment Utilized During Treatment Gait belt    Activity Tolerance Patient tolerated treatment well    Behavior During Therapy WFL for tasks assessed/performed                Past Medical History:  Diagnosis Date   ADD (attention deficit disorder)    Allergic rhinitis    skin test 02/23/08 vaccine 2009; allergy shots weekly through Dr. Maple Hudson   Anemia    takes Fe -    Anginal pain (HCC)    Ankylosing spondylitis (HCC)    Anxiety    sees therapist Vernell Leep   Arthritis    Asthma    Dr. 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 Donnie Aho   CHEILECTOMY  10/16/2011   Procedure: CHEILECTOMY;  Surgeon: Sherri Rad, MD;  Location: [facility];  Service: Orthopedics;  Laterality: Right;   CHOLECYSTECTOMY     COLONOSCOPY     CORONARY ANGIOPLASTY     1995 after MI   CORONARY ANGIOPLASTY WITH STENT PLACEMENT     1999    ESOPHAGOGASTRODUODENOSCOPY  02/18/2012   Procedure: ESOPHAGOGASTRODUODENOSCOPY (EGD);  Surgeon: Florencia Reasons, MD;  Location: [facility];  Service: Endoscopy;  Laterality: N/A;   FOOT SURGERY     joint scraped, arthritis right foot   HERNIA REPAIR     LUMBAR DISC SURGERY     x2   right index finger  mass removed- arthritis     Dr. Amanda Pea   [facility]  Right 05/18/2021   medial toe consistent with callus   SKIN BIOPSY Left 05/17/2021   actinic keratosis   SPINAL CORD STIMULATOR BATTERY EXCHANGE N/A 10/11/2021   Procedure: SPINAL CORD STIMULATOR BATTERY EXCHANGE;  Surgeon: Venita Lick, MD;  Location: [facility] OR;  Service: Orthopedics;  Laterality: N/A;  60 mins   SPINAL CORD STIMULATOR INSERTION N/A 11/21/2016   Procedure: LUMBAR SPINAL CORD STIMULATOR INSERTION;  Surgeon: Venita Lick, MD;  Location: [facility] OR;  Service: Orthopedics;  Laterality: N/A;  Requests 2.5 hrs   TONSILLECTOMY     1953   UPPER GI ENDOSCOPY  [id number]   VASECTOMY     1980   Patient Active Problem List   Diagnosis Date Noted   OSA (obstructive sleep apnea) 11/12/2022   Parkinson's disease 11/12/2022   Snoring 10/18/2022   Peripheral vascular disease (HCC) 12/25/2021   Dizziness 12/24/2021   Leg pain, left 11/14/2021   Chronic low back pain with sciatica 11/14/2021   Spinal cord stimulator status 11/14/2021   Muscle spasm 11/14/2021   Constipation 09/11/2021   Slow transit constipation 09/11/2021   Cough 09/11/2021   Early satiety 09/11/2021   History of malignant neoplasm of colon  09/11/2021   Hoarseness 09/11/2021   Iron deficiency 09/11/2021   Personal history of colonic polyps 09/11/2021   Weight decreased 09/11/2021   Esophageal motility disorder 09/11/2021   Gastritis 09/11/2021   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   Pain in both feet 08/10/2021   Diverticulitis 02/26/2021   Atherosclerosis of aorta (HCC) 08/30/2020   Mild concentric left ventricular hypertrophy (LVH)    Hypogonadism male    History of kidney stones    Erectile dysfunction    COPD (chronic obstructive pulmonary disease) (HCC)    Acquired trigger finger of left middle finger 04/22/2019   Acquired trigger finger of right middle finger 04/22/2019   Left ventricular dysfunction 10/12/2018   Ankylosing spondylitis (HCC) 10/09/2018   Presbycusis of both ears 04/23/2018   Mild intermittent asthma without complication 04/23/2018   Headache 01/30/2017   Chronic pain 11/21/2016   Arthritis 10/15/2016   Spondylolisthesis of lumbar region 02/28/2015   Hyperlipidemia    Lumbar disc disease    Hypertension    CAD (coronary artery disease) 05/19/2012   GERD (gastroesophageal reflux disease)    Depression with anxiety    Chronic asthma 05/17/2011   Allergic rhinitis due to pollen 08/08/2010   History of benign positional vertigo    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 Had a busy weekend and did a lot of walking. No falls.   Pt accompanied by:  Wife,  [name]  PERTINENT HISTORY: Pt falls frequently, always in forward direction and insists on using back stairs despite frequent falls over top step. Started Sinemet week of 11/04/22  PAIN:  Are you having pain? No  PRECAUTIONS: Fall  WEIGHT BEARING RESTRICTIONS: No  FALLS: Has patient fallen in last 6 months? Yes. Number of falls "more than I can count"  LIVING ENVIRONMENT: Lives with: lives with their spouse Lives in: House/apartment Stairs: Yes: External: 3 in front and 5 in back steps; on right going up, on left going up, can reach both, and always uses back steps (where he has the most falls) Has following equipment at home: Single point cane  PLOF: Independent  PATIENT GOALS: "I want to be in the next Olympics" "I would like to walk with minimal pain and get up and down with no assistance"  OBJECTIVE:   COGNITION: Overall cognitive status: Impaired   SENSATION: Denies numbness/tingling in BUE/BLEs  COORDINATION: Heel to shin test: WNL bilaterally   EDEMA: Frequent swelling in R ankle    POSTURE: rounded shoulders, forward head, and left pelvic obliquity  LOWER EXTREMITY MMT:  Tested in seated position   MMT Right Eval Left Eval  Hip flexion 4+ 5  Hip extension    Hip abduction 4 4  Hip adduction 5 5  Hip internal rotation    Hip external rotation    Knee flexion 5 5  Knee extension 5 5  Ankle dorsiflexion 3 5  Ankle plantarflexion    Ankle inversion    Ankle eversion    (Blank rows = not tested)   TODAY'S TREATMENT:    GAIT: Gait pattern: step through pattern, decreased arm swing- Right, decreased arm swing- Left, decreased step length- Right, decreased step length- Left, decreased stride length, decreased hip/knee flexion- Right, decreased hip/knee flexion- Left, decreased ankle dorsiflexion- Right, decreased ankle dorsiflexion- Left, shuffling, trunk flexed, narrow BOS, poor foot clearance- Right, and poor foot clearance- Left Distance walked: Clinic  distances  Assistive device utilized: Agricultural consultant  Level of assistance: Supervision  Comments: Continued cues to focus on step length and heel strike and staying close to [employer]   STAIRS:  Level of Assistance: CGA/SBA  Stair Negotiation Technique: Alternating Pattern  with Bilateral Rails  Number of Stairs: 4 x 7 reps total (28 steps, pt kept wanting to work on steps)   Height of Stairs: 4  Comments: Practiced stairs as pt has difficulty going down his deck steps. Pt has 2 railings. Worked on alternating pattern ascending and descending with use of bilateral hand rails. Educated to use rails AT ALL TIMES for safety. When descending, pt tends to get heel of shoe caught due to not kicking leg out far enough to clear step. Cued pt for deliberate "big kick" when coming down the steps to help with clearing foot on step. This cue worked for pt and pt with improvement when descending. Pt also reporting this felt easier. Discussed with pt's spouse proper cue to give pt for doing this at home.   Pt reporting that he has cement stairs that go down to the laundry room with no railing and that he enjoys doing laundry. Discussed safety concerns and that he should not be doing these stairs (pt's spouse has not let pt doing these at home).    NMR:  On air ex: 15 reps sit <> stands into PWR Up with pt needing to use UE support to stand and focus on immediate tall posture once in standing and eccentric control back to mat table, once pt sat back down, had pt immediately stand back up again. There were a few instances where pt did not lean far enough forwards and then had an episode of retropulsion back to mat table  Holding purple ball and raising it over head x10 reps for shoulder extension, elbow extension, posture and 5 second holds, additional cues for hip extension at end range    Working on resisted stepping strategies in // bars with resistance around pelvis With one PT providing resistance and 2nd PT  providing min guard/min A for balance, worked on alternating forward stepping to 2 targets, cued for weight shifting when stepping forwards, beginning with green resistance and then progressing to heavier blue resistance, beginning with UE support > none, with cued for posture. Performed massed practice with adding in difficulty by random resistance and resisting from different directions with pt needing to use bars at times for balance  Lateral stepping to target with blue resistance, cued for posture, beginning with stepping to target and then progressing difficulty by stepping over 2" obstacle for foot clearance and SLS, pt more challenged with RLE, performed approx. 15 reps each side    PATIENT EDUCATION: Education details: Continue HEP, stair training  Person educated: Patient and Spouse Education method: Explanation, Demonstration, and Verbal cues Education comprehension: verbalized understanding, returned demonstration, verbal cues required, and needs further education  HOME EXERCISE PROGRAM: Seated PWR Up/Step  Standing PWR Rock/Step  Access Code: [email or web address] URL: [email or web address] Date: 11/20/2022 Prepared by: Sherlie Ban  Exercises - Sit to Stand with Armchair  - 1-2 x daily - 5 x weekly - 2 sets - 10 reps  GOALS: Goals reviewed with patient? Yes  SHORT TERM GOALS: Target date: 12/13/2022   Pt will be independent with initial HEP for improved strength, balance, transfers and gait.  Baseline: pt reports rarely performing it at home  Goal status: IN PROGRESS  2.  Pt will improve gait velocity to at least 2.25ft/s w/LRAD and CGA for improved gait efficiency and reduced fall risk   Baseline: 1.95 ft/s w/o AD and min A  20.6 seconds with RW = 1.59 ft/sec on 12/13/22, with supervision/CGA Goal status: NOT MET  3.  Pt will perform single sit to stand without UE support w/CGA and no retropulsion for improved safety w/transfers and body mechanics   Baseline:  mod A due to significant retropulsion, met on 12/13/22 Goal status: MET  4. Pt will improve TUG time to 17 seconds or less with LRAD in order to demo decrease fall risk. Baseline: 20 seconds no AD  18.4 seconds with RW on 12/13/22 Goal status: NOT MET  5.  Pt and wife will verbalize fall prevention techniques for home and community setting for reduced fall frequency and improved safety.  Baseline: Pt falling multiple times per week Goal status: INITIAL  6.  Pt will trial various assistive devices in clinic to determine safest option for mobility  Baseline: pt using RW  Goal status: MET    LONG TERM GOALS: Target date: 01/10/2023  Pt will be independent with final HEP for improved strength, balance, transfers and gait.  Baseline:  Goal status: INITIAL  2.  Pt will improve gait velocity to at least 2.6 ft/s w/LRAD and SBA for improved gait efficiency and independence  Baseline: 1.95 ft/s w/o AD and min A Goal status: INITIAL  3.  Pt will improve TUG time to 15 seconds or less with LRAD and supervision  in order to demo decrease fall risk. Baseline: 20 seconds with no AD, CGA Goal status: INITIAL  4.  Pt will improve 5 x STS to less than or equal to 15 seconds w/good posture and form to demonstrate improved functional strength and transfer efficiency.   Baseline: 15.41s w/BUE support, significant posterior lean and flexed posture  Goal status: INITIAL  5.  Pt will trial various bracing options on RLE to reduce foot slap and fall frequency.  Baseline:  Goal status: INITIAL   ASSESSMENT:  CLINICAL IMPRESSION: Today's skilled session focused on resisted stepping strategies, balance on compliant surfaces with sit <> stands, and stair training. Pt reporting difficulties with descending stairs due to the heel of his shoe getting stuck. Practiced stairs, and pt did better with cues for deliberate "big kick" when descending to allow for foot clearance on step. This worked well for pt  and went over with pt's spouse cues to use for home. Educated to use both handrails at all times. Will continue to progress towards LTGs.    OBJECTIVE IMPAIRMENTS: Abnormal gait, decreased balance, decreased cognition, decreased coordination, decreased knowledge of condition, decreased knowledge of use of DME, decreased mobility, difficulty walking, decreased strength, decreased safety awareness, impaired perceived functional ability, improper body mechanics, postural dysfunction, and pain  ACTIVITY LIMITATIONS: carrying, lifting, bending, standing, squatting, stairs, transfers, bed mobility, reach over head, locomotion level, and caring for others  PARTICIPATION LIMITATIONS: medication management, interpersonal relationship, driving, shopping, community activity, and yard work  PERSONAL FACTORS: Age, Behavior pattern, Fitness, Past/current experiences, and 1 comorbidity: Vascular parkinsonism  are also affecting patient's functional outcome.   REHAB POTENTIAL: Fair due to diagnosis   CLINICAL DECISION MAKING: Evolving/moderate complexity  EVALUATION COMPLEXITY: Moderate  PLAN:  PT FREQUENCY: 2x/week  PT DURATION: 8 weeks  PLANNED INTERVENTIONS: Therapeutic exercises, Therapeutic activity, Neuromuscular re-education, Balance training, Gait training, Patient/Family education, Self Care, Joint mobilization, Stair training, Vestibular training, Canalith repositioning, Orthotic/Fit training, DME instructions, Manual therapy, and Re-evaluation  PLAN FOR NEXT SESSION:  Add walking program for endurance. Review freezing/turns. RLE foot clearance tasks. practice gait with RW. trial braces on RLE. Floor transfer    Drake Leach, PT, DPT 12/24/2022, 12:41 PM

## 2022-12-25 ENCOUNTER — Encounter: Payer: Self-pay | Admitting: Neurology

## 2022-12-26 ENCOUNTER — Ambulatory Visit: Payer: Medicare Other | Admitting: Speech Pathology

## 2022-12-26 ENCOUNTER — Ambulatory Visit: Payer: Medicare Other | Admitting: Physical Therapy

## 2022-12-26 DIAGNOSIS — R2689 Other abnormalities of gait and mobility: Secondary | ICD-10-CM | POA: Diagnosis not present

## 2022-12-26 DIAGNOSIS — R2681 Unsteadiness on feet: Secondary | ICD-10-CM

## 2022-12-26 DIAGNOSIS — R471 Dysarthria and anarthria: Secondary | ICD-10-CM

## 2022-12-26 DIAGNOSIS — R293 Abnormal posture: Secondary | ICD-10-CM

## 2022-12-26 NOTE — Therapy (Signed)
OUTPATIENT SPEECH LANGUAGE PATHOLOGY [facility]   Patient Name: [name] MRN: [id number] DOB:1945-11-12, 77 y.o., male Today's Date: 12/26/2022  PCP: Ronnald Nian, MD REFERRING PROVIDER: Vladimir Faster, DO  END OF SESSION:  End of Session - 12/26/22 1150     Visit Number 5    Number of Visits 13    Date for SLP Re-Evaluation 01/24/23    Authorization Type [employer]    Progress Note Due on Visit 10    SLP Start Time 1150    SLP Stop Time  1235    SLP Time Calculation (min) 45 min    Activity Tolerance Patient tolerated treatment well             Past Medical History:  Diagnosis Date   ADD (attention deficit disorder)    Allergic rhinitis    skin test 02/23/08 vaccine 2009; allergy shots weekly through Dr. Maple Hudson   Anemia    takes Fe -    Anginal pain (HCC)    Ankylosing spondylitis (HCC)    Anxiety    sees therapist Vernell Leep   Arthritis    Asthma    Dr. 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 Donnie Aho    CHEILECTOMY  10/16/2011   Procedure: CHEILECTOMY;  Surgeon: Sherri Rad, MD;  Location: [facility];  Service: Orthopedics;  Laterality: Right;   CHOLECYSTECTOMY     COLONOSCOPY     CORONARY ANGIOPLASTY     1995 after MI   CORONARY ANGIOPLASTY WITH STENT PLACEMENT     1999    ESOPHAGOGASTRODUODENOSCOPY  02/18/2012   Procedure: ESOPHAGOGASTRODUODENOSCOPY (EGD);  Surgeon: Florencia Reasons, MD;  Location: [facility];  Service: Endoscopy;  Laterality: N/A;   FOOT SURGERY     joint scraped, arthritis right foot   HERNIA REPAIR     LUMBAR DISC SURGERY     x2   right index finger  mass removed- arthritis     Dr. Amanda Pea   [facility]  Right 05/18/2021   medial toe consistent with callus   SKIN BIOPSY Left 05/17/2021   actinic keratosis   SPINAL CORD STIMULATOR BATTERY EXCHANGE N/A 10/11/2021   Procedure: SPINAL CORD STIMULATOR BATTERY EXCHANGE;  Surgeon: Venita Lick, MD;  Location: [facility] OR;  Service: Orthopedics;  Laterality: N/A;  60 mins   SPINAL CORD STIMULATOR INSERTION N/A 11/21/2016  Procedure: LUMBAR SPINAL CORD STIMULATOR INSERTION;  Surgeon: Venita Lick, MD;  Location: [facility];  Service: Orthopedics;  Laterality: N/A;  Requests 2.5 hrs   TONSILLECTOMY     1953   UPPER GI ENDOSCOPY  [id number]   VASECTOMY     1980   Patient Active Problem List   Diagnosis Date Noted   OSA (obstructive sleep apnea) 11/12/2022   Parkinson's disease 11/12/2022   Snoring 10/18/2022   Peripheral vascular disease (HCC) 12/25/2021   Dizziness 12/24/2021   Leg pain, left 11/14/2021   Chronic low back pain with sciatica 11/14/2021   Spinal cord stimulator status 11/14/2021   Muscle spasm 11/14/2021   Constipation 09/11/2021   Slow transit constipation 09/11/2021   Cough 09/11/2021   Early satiety 09/11/2021   History of malignant neoplasm of colon 09/11/2021   Hoarseness 09/11/2021   Iron deficiency 09/11/2021   Personal history of colonic polyps 09/11/2021   Weight decreased  09/11/2021   Esophageal motility disorder 09/11/2021   Gastritis 09/11/2021   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   Pain in both feet 08/10/2021   Diverticulitis 02/26/2021   Atherosclerosis of aorta (HCC) 08/30/2020   Mild concentric left ventricular hypertrophy (LVH)    Hypogonadism male    History of kidney stones    Erectile dysfunction    COPD (chronic obstructive pulmonary disease) (HCC)    Acquired trigger finger of left middle finger 04/22/2019   Acquired trigger finger of right middle finger 04/22/2019   Left ventricular dysfunction 10/12/2018   Ankylosing spondylitis (HCC) 10/09/2018   Presbycusis of both ears 04/23/2018   Mild intermittent asthma without complication 04/23/2018   Headache 01/30/2017   Chronic pain 11/21/2016   Arthritis 10/15/2016   Spondylolisthesis of lumbar region 02/28/2015   Hyperlipidemia    Lumbar disc disease    Hypertension    CAD (coronary artery disease) 05/19/2012   GERD (gastroesophageal reflux disease)    Depression with anxiety    Chronic asthma 05/17/2011   Allergic rhinitis due to pollen 08/08/2010   History of benign positional vertigo    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  Patient was in the emergency room on April 1 with mental status change.  Patient had apparently had a URI and had been taking Mucinex and had some degree of confusion with the URI/medication.  Patient had gotten in the shower and his wife went into check on him 30 minutes later and found that he had fallen.  There was apparently no syncope and patient did not hit his head.  Patient went  to the hospital and was found to be hypokalemic at 2.3.  Patient did not have MRI because of spinal cord stimulator.  Patient followed up with primary care physician, at which point they noted that patient had been having a chronic change in gait with forward leaning trunk and gait for several months (perhaps longer per pt/wife)   PAIN:  Are you having pain? No  PATIENT GOALS: "get back to singing"  OBJECTIVE:   TODAY'S TREATMENT:  12/26/22: Entered with suboptimal conversational volume with average lower 60s dB. Targeted dysarthria through lesson 4 of Speak Out! Patient required frequent mod-A of frequent feedback, models, reminders to breathe, and visual feedback to increase volume. Patient states that he still has not received his Speak Out book, and another book will be sent. Patient endorses that hoarse voice decreases when he uses intentional voice.   Sustained "ah": 84 dB.    Reading: 73 dB.    Conversational average: participated 5 min unstructured conversation averaging lower 60s dB. Next session will start with SOVTE.   12/20/22: Entered with suboptimal conversational volume with average fading from mid to low 60s dB with usual hoarseness noted. No attempts to correct volume decay or hoarseness demonstrated. To optimize vocal intensity and clarity, SLP led pt through lesson 3 of Speak Out!  Usual mod cues required to use intentional volume, rate, and breath support to maximize performance. Conversational sample of approximately 4 minutes, with pt averages high fading to mid 60s with occasional mod-A. Sustained "oh" 81 dB Reading 72 dB   12/17/22: Patient led through lesson 2 of Speak Out! Pt states that they still haven't received their Speak Out! Book. Education on slowing down while working through warm-ups in order to create sustained sound with good,  diaphragmatic, breath support. Education given on layout of [employer], and how to speak with intent. Patient continued to want to rush through the work and required frequent mod-A for reminders to slow down and speak out. Pt benefiting from "ool" for sustained phonation and glides to improve vocal quality. Conversational sample of approximately 4 minutes, with pt averages mid to high 60s with occasional mod-A. Sustained "oh" 83 dB Reading 72 dB Cognitive speech task 71 dB.   12/13/22: Educated pt and wife on speech-related PD impairments with education provided on throat clear alternatives, diaphragmatic breathing (prior knowledge as singer), and targeted dysarthria program, Speak Out!, to address decline in conversational volume and hoarseness. Pt has not yet received Speak Out! Book. Targeted improving vocal quality and increasing intensity through progressively difficulty speech tasks using Speak Out! program, lesson 1. ST leads pt through exercises providing usual model prior to pt execution. usual mod-A required to achieve target dB this date, particularly to optimize breath support. Increased awareness of volume decay and decreased breath support exhibited as session progressed. Averaged this date:  Sustained "ah" 83 dB Reading 72 dB Cognitive speech task 71 dB.  Conversational sample of approx 3 minutes, pt averages upper 60s dB with occasional mod-A.   11/29/22: Collaborated with patient and spouse to generate POC and goals. Initiated training in use of intentional voice to optimize vocal intensity. Use of verbal cues and models effective in increasing volume and resolving volume decay noted as baseline. Introduced speak out program to pt and spouse.   PATIENT EDUCATION: Education details: see above.  Person educated: Patient and Spouse Education method: Medical illustrator Education comprehension: verbalized understanding and returned demonstration  HOME EXERCISE  PROGRAM: Speak Out!    GOALS: Goals reviewed with patient? Yes  SHORT TERM GOALS: Target date: 12/27/2022  Pt will complete HEP at least 1x/day given occasional min A over 2 sessions  Baseline: Goal status: Deferred--Pt has not received Speak Out! Book yet.   2.  Pt will meet or exceed dB targets during reading exercises with 90% accuracy with rare min-A.  Baseline:  Goal status: MET  3.  Pt will demonstrate SOVTE and vocal function exercises with mod-I.  Baseline:  Goal status: MET  LONG TERM GOALS: Target date: 01/10/2023  1.  Pt and spouse will report reduced need to repeat information at home > 1 week.  Baseline: 21.5/32 Goal status: IN PROGRESS  2.  Pt will utilize dysarthria compensations in 5-10 minute conversation and maintain WNL conversational volume (70-72 dB) given occasional min A over 2 sessions   Baseline:  Goal status: IN PROGRESS  3.  Pt will demonstrate intentional voice and flow phonation during singing exercise with min-A  Baseline:  Goal status: IN PROGRESS   4.  Pt will report improvement via PROM by d/c   Baseline: CPIB=21.5  Goal status: IN PROGRESS   ASSESSMENT:  CLINICAL IMPRESSION: Patient is a 77 y.o. M who was seen today for motor speech s/p hoarse voice and decreased volume. Patient was able to increase volume upon initiation of phrases but needed prompting to keep his volume up during long, unstructured conversation. Patient encouraged to use his "presenting" voice to speak up and out with intent. Pt's speech is c/b intermittent hoarse vocal quality, reduced vocal intensity, volume decay. Conducted ongoing education and training of Speak Out! Program to maximize volume intensity and clarity with usual mod A required at this time to achieve targeted dB levels. Pt would benefit from skilled ST to address aforementioned deficits to enhance communication efficacy.    OBJECTIVE IMPAIRMENTS: Objective impairments include dysarthria. These impairments  are limiting patient from effectively communicating at home and in community.Factors affecting potential to achieve goals and functional outcome are co-morbidities and medical prognosis. Patient will benefit from skilled SLP services to address above impairments and improve overall function.  REHAB POTENTIAL: Excellent  PLAN:  SLP FREQUENCY: 1-2x/week  SLP DURATION: 8 weeks   PLANNED INTERVENTIONS: Cueing hierachy, Oral motor exercises, SLP instruction and feedback, Compensatory strategies, and Patient/family education  [place], Student-SLP 12/26/2022, 11:50 AM

## 2022-12-26 NOTE — Therapy (Signed)
OUTPATIENT PHYSICAL THERAPY NEURO TREATMENT   Patient Name: [name] MRN: [id number] DOB:[birth year], 77 y.o., male Today's Date: 12/26/2022   PCP: Ronnald Nian, MD  REFERRING PROVIDER: Vladimir Faster, DO  END OF SESSION:  PT End of Session - 12/26/22 1105     Visit Number 12    Number of Visits 17   Plus eval   Date for PT Re-Evaluation 01/10/23    Authorization Type [employer] Medicare    Progress Note Due on Visit 10    PT Start Time 1103    PT Stop Time 1148    PT Time Calculation (min) 45 min    Equipment Utilized During Treatment Gait belt    Activity Tolerance Patient tolerated treatment well    Behavior During Therapy WFL for tasks assessed/performed                 Past Medical History:  Diagnosis Date   ADD (attention deficit disorder)    Allergic rhinitis    skin test 02/23/08 vaccine 2009; allergy shots weekly through Dr. Maple Hudson   Anemia    takes Fe -    Anginal pain (HCC)    Ankylosing spondylitis (HCC)    Anxiety    sees therapist Vernell Leep   Arthritis    Asthma    Dr. 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 Donnie Aho   CHEILECTOMY  10/16/2011   Procedure: CHEILECTOMY;  Surgeon: Sherri Rad, MD;  Location: [facility];  Service: Orthopedics;  Laterality: Right;   CHOLECYSTECTOMY     COLONOSCOPY     CORONARY ANGIOPLASTY     1995 after MI   CORONARY ANGIOPLASTY WITH STENT PLACEMENT     1999    ESOPHAGOGASTRODUODENOSCOPY  02/18/2012   Procedure: ESOPHAGOGASTRODUODENOSCOPY (EGD);  Surgeon: Florencia Reasons, MD;  Location: [facility];  Service: Endoscopy;  Laterality: N/A;   FOOT SURGERY     joint scraped, arthritis right foot   HERNIA REPAIR     LUMBAR DISC SURGERY     x2   right index finger  mass removed- arthritis     Dr. Amanda Pea   [facility]  Right 05/18/2021   medial toe consistent with callus   SKIN BIOPSY Left 05/17/2021   actinic keratosis   SPINAL CORD STIMULATOR BATTERY EXCHANGE N/A 10/11/2021   Procedure: SPINAL CORD STIMULATOR BATTERY EXCHANGE;  Surgeon: Venita Lick, MD;  Location: [facility];  Service: Orthopedics;  Laterality: N/A;  60 mins   SPINAL CORD STIMULATOR INSERTION N/A 11/21/2016   Procedure: LUMBAR SPINAL CORD STIMULATOR INSERTION;  Surgeon: Venita Lick, MD;  Location: [facility];  Service: Orthopedics;  Laterality: N/A;  Requests 2.5 hrs   TONSILLECTOMY     1953   UPPER GI ENDOSCOPY  220254   VASECTOMY     1980   Patient Active Problem List   Diagnosis Date Noted   OSA (obstructive sleep apnea) 11/12/2022   Parkinson's disease 11/12/2022   Snoring 10/18/2022   Peripheral vascular disease (HCC) 12/25/2021   Dizziness 12/24/2021   Leg pain, left 11/14/2021   Chronic low back pain with sciatica 11/14/2021   Spinal cord stimulator status 11/14/2021   Muscle spasm 11/14/2021   Constipation 09/11/2021   Slow transit constipation 09/11/2021   Cough 09/11/2021   Early satiety 09/11/2021   History of malignant neoplasm of colon  09/11/2021   Hoarseness 09/11/2021   Iron deficiency 09/11/2021   Personal history of colonic polyps 09/11/2021   Weight decreased 09/11/2021   Esophageal motility disorder 09/11/2021   Gastritis 09/11/2021   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   Pain in both feet 08/10/2021   Diverticulitis 02/26/2021   Atherosclerosis of aorta (HCC) 08/30/2020   Mild concentric left ventricular hypertrophy (LVH)    Hypogonadism male    History of kidney stones    Erectile dysfunction    COPD (chronic obstructive pulmonary disease) (HCC)    Acquired trigger finger of left middle finger 04/22/2019   Acquired trigger finger of right middle finger 04/22/2019   Left ventricular dysfunction 10/12/2018   Ankylosing spondylitis (HCC) 10/09/2018   Presbycusis of both ears 04/23/2018   Mild intermittent asthma without complication 04/23/2018   Headache 01/30/2017   Chronic pain 11/21/2016   Arthritis 10/15/2016   Spondylolisthesis of lumbar region 02/28/2015   Hyperlipidemia    Lumbar disc disease    Hypertension    CAD (coronary artery disease) 05/19/2012   GERD (gastroesophageal reflux disease)    Depression with anxiety    Chronic asthma 05/17/2011   Allergic rhinitis due to pollen 08/08/2010   History of benign positional vertigo    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 Noticing he is running into obstacles on the right side more.   Pt accompanied by:  Wife,  [name]  PERTINENT HISTORY: Pt falls frequently, always in forward direction and insists on using back stairs despite frequent falls over top step. Started Sinemet week of 11/04/22  PAIN:  Are you having pain? No  PRECAUTIONS: Fall  WEIGHT BEARING RESTRICTIONS: No  FALLS: Has patient fallen in last 6 months? Yes. Number of falls "more than I can count"  LIVING ENVIRONMENT: Lives with: lives with their spouse Lives in: House/apartment Stairs: Yes: External: 3 in front and 5 in back steps; on right going up, on left going up, can reach both, and always uses back steps (where he has the most falls) Has following equipment at home: Single point cane  PLOF: Independent  PATIENT GOALS: "I want to be in the next Olympics" "I would like to walk with minimal pain and get up and down with no assistance"  OBJECTIVE:   COGNITION: Overall cognitive status: Impaired   SENSATION: Denies numbness/tingling in BUE/BLEs  COORDINATION: Heel to shin test: WNL bilaterally   EDEMA: Frequent swelling in R ankle    POSTURE: rounded shoulders, forward head, and left pelvic obliquity  LOWER EXTREMITY MMT:  Tested in seated position   MMT Right Eval Left Eval  Hip flexion 4+ 5  Hip extension    Hip abduction 4 4  Hip adduction 5 5  Hip internal rotation    Hip external rotation    Knee flexion 5 5  Knee extension 5 5  Ankle dorsiflexion 3 5  Ankle plantarflexion    Ankle inversion    Ankle eversion    (Blank rows = not tested)   TODAY'S TREATMENT:    NMR:  At mat table, spent session performing mass practice of sit tot stands as pt reports feeling as though his "knees are weak". Pt unable to perform sit to stand without UE support from standard mat height, so added airex under bottom. Pt able to perform 3 reps w/added height but noted significant bracing against mat table w/back of legs. Noted tightness in bilateral hamstrings, so performed seated hamstring stretch, x1 minute per side.  Pt reported feeling his "patella move" w/stretch. Provided max multimodal cues using NDT principles for proper sit to stand transfer (scooting hips towards edge of seat, anterior weight shift) and pt very challenged by this, but after lots of practice and encouragement from wife and therapist, pt able to perform well. Pt continued to have inconsistent episodes of retropulsion, but was able to correct hip/foot position without therapist cues throughout. Progressed to performing from standard mat height and pt nervous to try, but was able to perform 5 reps without UE support. Added weighted ball (6#) to movement to facilitate anterior weight shift and pt reported feeling more stable when holding ball. Lastly attempted to perform w/blue wedge under forefeet to bias posterior lean and facilitate strong anterior lean. Pt very anxious about this, requiring BUE support and min A initially due to posterior LOB. However, pt able to perform 3-4 reps independently w/no instability by end of session.  Added sit to stands without UE support to HEP (see bolded below) for pt to practice at home for improved BLE strength and anterior weight shift.    PATIENT EDUCATION: Education details: updates to HEP Person educated: Patient and Spouse Education method: Explanation, Facilities manager, Verbal cues, and Handouts Education comprehension: verbalized understanding,  returned demonstration, verbal cues required, and needs further education  HOME EXERCISE PROGRAM: Seated PWR Up/Step  Standing PWR Rock/Step  Access Code: [email or web address] URL: [email or web address] Date: 12/26/2022 Prepared by: Alethia Berthold Ronee Ranganathan  Exercises - Sit to Stand Without Arm Support  - 1 x daily - 7 x weekly - 3 sets - 10 reps  GOALS: Goals reviewed with patient? Yes  SHORT TERM GOALS: Target date: 12/13/2022   Pt will be independent with initial HEP for improved strength, balance, transfers and gait.  Baseline: pt reports rarely  performing it at home  Goal status: IN PROGRESS  2.  Pt will improve gait velocity to at least 2.63ft/s w/LRAD and CGA for improved gait efficiency and reduced fall risk   Baseline: 1.95 ft/s w/o AD and min A  20.6 seconds with RW = 1.59 ft/sec on 12/13/22, with supervision/CGA Goal status: NOT MET  3.  Pt will perform single sit to stand without UE support w/CGA and no retropulsion for improved safety w/transfers and body mechanics   Baseline: mod A due to significant retropulsion, met on 12/13/22 Goal status: MET  4. Pt will improve TUG time to 17 seconds or less with LRAD in order to demo decrease fall risk. Baseline: 20 seconds no AD  18.4 seconds with RW on 12/13/22 Goal status: NOT MET  5.  Pt and wife will verbalize fall prevention techniques for home and community setting for reduced fall frequency and improved safety.  Baseline: Pt falling multiple times per week Goal status: INITIAL  6.  Pt will trial various assistive devices in clinic to determine safest option for mobility  Baseline: pt using RW  Goal status: MET    LONG TERM GOALS: Target date: 01/10/2023  Pt will be independent with final HEP for improved strength, balance, transfers and gait.  Baseline:  Goal status: INITIAL  2.  Pt will improve gait velocity to at least 2.6 ft/s w/LRAD and SBA for improved gait efficiency and independence   Baseline: 1.95 ft/s w/o AD and min A Goal status: INITIAL  3.  Pt will improve TUG time to 15 seconds or less with LRAD and supervision  in order to demo decrease fall risk. Baseline: 20 seconds with no AD, CGA Goal status: INITIAL  4.  Pt will improve 5 x STS to less than or equal to 15 seconds w/good posture and form to demonstrate improved functional strength and transfer efficiency.   Baseline: 15.41s w/BUE support, significant posterior lean and flexed posture  Goal status: INITIAL  5.  Pt will trial various bracing options on RLE to reduce foot slap and fall  frequency.  Baseline:  Goal status: INITIAL   ASSESSMENT:  CLINICAL IMPRESSION: Emphasis of skilled PT session on BLE strength and proper sit to stand technique. Pt reports feeling "weak in the knees" and noted pt bracing against seated surface to stand. Pt initially unable to facilitate anterior weight shift w/transfer or stabilize independently, but quickly demonstrated improvements in body mechanics and stabilization w/practice. Continue POC.    OBJECTIVE IMPAIRMENTS: Abnormal gait, decreased balance, decreased cognition, decreased coordination, decreased knowledge of condition, decreased knowledge of use of DME, decreased mobility, difficulty walking, decreased strength, decreased safety awareness, impaired perceived functional ability, improper body mechanics, postural dysfunction, and pain  ACTIVITY LIMITATIONS: carrying, lifting, bending, standing, squatting, stairs, transfers, bed mobility, reach over head, locomotion level, and caring for others  PARTICIPATION LIMITATIONS: medication management, interpersonal relationship, driving, shopping, community activity, and yard work  PERSONAL FACTORS: Age, Behavior pattern,  Fitness, Past/current experiences, and 1 comorbidity: Vascular parkinsonism  are also affecting patient's functional outcome.   REHAB POTENTIAL: Fair due to diagnosis   CLINICAL DECISION MAKING: Evolving/moderate complexity  EVALUATION COMPLEXITY: Moderate  PLAN:  PT FREQUENCY: 2x/week  PT DURATION: 8 weeks  PLANNED INTERVENTIONS: Therapeutic exercises, Therapeutic activity, Neuromuscular re-education, Balance training, Gait training, Patient/Family education, Self Care, Joint mobilization, Stair training, Vestibular training, Canalith repositioning, Orthotic/Fit training, DME instructions, Manual therapy, and Re-evaluation  PLAN FOR NEXT SESSION:  Add walking program for endurance. Review freezing/turns. RLE foot clearance tasks. practice gait with RW. trial  braces on RLE. Floor transfer    Jill Alexanders Onyx Edgley, PT, DPT 12/26/2022, 11:53 AM

## 2022-12-31 ENCOUNTER — Encounter: Payer: Self-pay | Admitting: Physical Therapy

## 2022-12-31 ENCOUNTER — Ambulatory Visit: Payer: Medicare Other | Admitting: Physical Therapy

## 2022-12-31 ENCOUNTER — Ambulatory Visit: Payer: Medicare Other | Admitting: Speech Pathology

## 2022-12-31 VITALS — BP 132/83 | HR 82

## 2022-12-31 DIAGNOSIS — R296 Repeated falls: Secondary | ICD-10-CM

## 2022-12-31 DIAGNOSIS — R2689 Other abnormalities of gait and mobility: Secondary | ICD-10-CM

## 2022-12-31 DIAGNOSIS — R471 Dysarthria and anarthria: Secondary | ICD-10-CM

## 2022-12-31 DIAGNOSIS — R293 Abnormal posture: Secondary | ICD-10-CM

## 2022-12-31 DIAGNOSIS — R2681 Unsteadiness on feet: Secondary | ICD-10-CM

## 2022-12-31 NOTE — Therapy (Signed)
OUTPATIENT PHYSICAL THERAPY NEURO TREATMENT   Patient Name: [name] MRN: [id number] DOB:01-09-1946, 77 y.o., male Today's Date: 12/31/2022   PCP: Ronnald Nian, MD  REFERRING PROVIDER: Vladimir Faster, DO  END OF SESSION:  PT End of Session - 12/31/22 1105     Visit Number 13    Number of Visits 17   Plus eval   Date for PT Re-Evaluation 01/10/23    Authorization Type [employer] Medicare    Progress Note Due on Visit 10    PT Start Time 1103    PT Stop Time 1144    PT Time Calculation (min) 41 min    Equipment Utilized During Treatment Gait belt    Activity Tolerance Patient tolerated treatment well    Behavior During Therapy WFL for tasks assessed/performed                 Past Medical History:  Diagnosis Date   ADD (attention deficit disorder)    Allergic rhinitis    skin test 02/23/08 vaccine 2009; allergy shots weekly through Dr. Maple Hudson   Anemia    takes Fe -    Anginal pain (HCC)    Ankylosing spondylitis (HCC)    Anxiety    sees therapist Vernell Leep   Arthritis    Asthma    Dr. 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 Donnie Aho   CHEILECTOMY  10/16/2011   Procedure: CHEILECTOMY;  Surgeon: Sherri Rad, MD;  Location: [facility];  Service: Orthopedics;  Laterality: Right;   CHOLECYSTECTOMY     COLONOSCOPY     CORONARY ANGIOPLASTY     1995 after MI   CORONARY ANGIOPLASTY WITH STENT PLACEMENT     1999    ESOPHAGOGASTRODUODENOSCOPY  02/18/2012   Procedure: ESOPHAGOGASTRODUODENOSCOPY (EGD);  Surgeon: Florencia Reasons, MD;  Location: [facility];  Service: Endoscopy;  Laterality: N/A;   FOOT SURGERY     joint scraped, arthritis right foot   HERNIA REPAIR     LUMBAR DISC SURGERY     x2   right index finger  mass removed- arthritis     Dr. Amanda Pea   [facility]  Right 05/18/2021   medial toe consistent with callus   SKIN BIOPSY Left 05/17/2021   actinic keratosis   SPINAL CORD STIMULATOR BATTERY EXCHANGE N/A 10/11/2021   Procedure: SPINAL CORD STIMULATOR BATTERY EXCHANGE;  Surgeon: Venita Lick, MD;  Location: [facility] OR;  Service: Orthopedics;  Laterality: N/A;  60 mins   SPINAL CORD STIMULATOR INSERTION N/A 11/21/2016   Procedure: LUMBAR SPINAL CORD STIMULATOR INSERTION;  Surgeon: Venita Lick, MD;  Location: [facility] OR;  Service: Orthopedics;  Laterality: N/A;  Requests 2.5 hrs   TONSILLECTOMY     1953   UPPER GI ENDOSCOPY  [id number]   VASECTOMY     1980   Patient Active Problem List   Diagnosis Date Noted   OSA (obstructive sleep apnea) 11/12/2022   Parkinson's disease 11/12/2022   Snoring 10/18/2022   Peripheral vascular disease (HCC) 12/25/2021   Dizziness 12/24/2021   Leg pain, left 11/14/2021   Chronic low back pain with sciatica 11/14/2021   Spinal cord stimulator status 11/14/2021   Muscle spasm 11/14/2021   Constipation 09/11/2021   Slow transit constipation 09/11/2021   Cough 09/11/2021   Early satiety 09/11/2021   History of malignant neoplasm of colon  09/11/2021   Hoarseness 09/11/2021   Iron deficiency 09/11/2021   Personal history of colonic polyps 09/11/2021   Weight decreased 09/11/2021   Esophageal motility disorder 09/11/2021   Gastritis 09/11/2021   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   Pain in both feet 08/10/2021   Diverticulitis 02/26/2021   Atherosclerosis of aorta (HCC) 08/30/2020   Mild concentric left ventricular hypertrophy (LVH)    Hypogonadism male    History of kidney stones    Erectile dysfunction    COPD (chronic obstructive pulmonary disease) (HCC)    Acquired trigger finger of left middle finger 04/22/2019   Acquired trigger finger of right middle finger 04/22/2019   Left ventricular dysfunction 10/12/2018   Ankylosing spondylitis (HCC) 10/09/2018   Presbycusis of both ears 04/23/2018   Mild intermittent asthma without complication 04/23/2018   Headache 01/30/2017   Chronic pain 11/21/2016   Arthritis 10/15/2016   Spondylolisthesis of lumbar region 02/28/2015   Hyperlipidemia    Lumbar disc disease    Hypertension    CAD (coronary artery disease) 05/19/2012   GERD (gastroesophageal reflux disease)    Depression with anxiety    Chronic asthma 05/17/2011   Allergic rhinitis due to pollen 08/08/2010   History of benign positional vertigo    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 Took a Meclizine, feeling better. Had a good bday. Has been more focused with his  steps. Still has to remember to slide to the front of the seat before standing.   Pt accompanied by:  Wife, [name]  PERTINENT HISTORY: Pt falls frequently, always in forward direction and insists on using back stairs despite frequent falls over top step. Started Sinemet week of 11/04/22  PAIN:  Are you having pain? No  Vitals:   12/31/22 1108  BP: 132/83  Pulse: 82     PRECAUTIONS: Fall  WEIGHT BEARING RESTRICTIONS: No  FALLS: Has patient fallen in last 6 months? Yes. Number of falls "more than I can count"  LIVING ENVIRONMENT: Lives with: lives with their spouse Lives in: House/apartment Stairs: Yes: External: 3 in front and 5 in back steps; on right going up, on left going up, can reach both, and always uses back steps (where he has the most falls) Has following equipment at home: Single point cane  PLOF: Independent  PATIENT GOALS: "I want to be in the next Olympics" "I would like to walk with minimal pain and get up and down with no assistance"  OBJECTIVE:   COGNITION: Overall cognitive status: Impaired   SENSATION: Denies numbness/tingling in BUE/BLEs  COORDINATION: Heel to shin test: WNL bilaterally   EDEMA: Frequent swelling in R ankle    POSTURE: rounded shoulders, forward head, and left pelvic obliquity  LOWER EXTREMITY MMT:  Tested in seated position   MMT Right Eval Left Eval  Hip flexion 4+ 5  Hip extension    Hip abduction 4 4  Hip adduction 5 5  Hip internal rotation    Hip external rotation    Knee flexion 5 5  Knee extension 5 5  Ankle dorsiflexion 3 5  Ankle plantarflexion    Ankle inversion    Ankle eversion    (Blank rows = not tested)   TODAY'S TREATMENT:    Therapeutic Exercise SciFit Multi-peaks at Gear 2.5 for 8 minutes for neural priming, reciprocal movement patterns, and larger amplitude movements., pt initially reporting effort level as 6/10 for larger amplitude movement patterns and not having much ROM. PT encouraging pt  throughout for larger effort and movements. Cued to reach up to an effort level as 7/10.  pt reporting effort level as 8/10 at the end.   NMR:  Reviewed proper sit <> stand technique (scooting out towards edge, tucking feet under him, and performing with incr forward lean to stand), performed 2 reps with BUE support, and performed 8 reps with no UE support  - pt with a couple reps of retropulsion, but improved with cues for bigger movements with nose over toes.  On rockerboard in A/P direction: Static stance holding balance 3 x 30 seconds, focus on tall posture with hip extension and trying to keep board in the middle, as pt with tendency to have incr weight bearing through heels Rocking board x10 reps with trying to maintain tall posture and incr weight through heels and onto toes  Alternating SLS taps to 2 cones, x15 reps each side, cues for slowed pace and incr foot clearance with RLE, as pt with tendency to knock cone over at times with RLE, beginning with UE > no UE support, pt does better when slowing down.  Alternating posterior step on and off board x15 reps each side for weight shifting, foot clearance, cues for posture and taking his time with  deliberate foot clearance  On outside of // bars: With blue resistance band around pelvis, PT providing random perturbations posteriorly and pt having to to take a big step backwards, performed x12 reps each side, with beginning with UE support > none, pt able to initiate one big step, more off balance with the RLE as step was more narrow, CGA for balance  Alternating forward stepping with bring arms out into PWR Up posture x10 reps, needs cues for proper technique and upright posture with arms extended and hands open   PATIENT EDUCATION: Education details: continue HEP, working on sit <> stands with proper technique and incr step length/posture with gait  Person educated: Patient and Spouse Education method: Programmer, multimedia, Facilities manager, Verbal cues,  and Handouts Education comprehension: verbalized understanding, returned demonstration, verbal cues required, and needs further education  HOME EXERCISE PROGRAM: Seated PWR Up/Step  Standing PWR Rock/Step  Access Code: [email or web address] URL: [email or web address] Date: 12/26/2022 Prepared by: Alethia Berthold Plaster  Exercises - Sit to Stand Without Arm Support  - 1 x daily - 7 x weekly - 3 sets - 10 reps  GOALS: Goals reviewed with patient? Yes  SHORT TERM GOALS: Target date: 12/13/2022   Pt will be independent with initial HEP for improved strength, balance, transfers and gait.  Baseline: pt reports rarely performing it at home  Goal status: IN PROGRESS  2.  Pt will improve gait velocity to at least 2.10ft/s w/LRAD and CGA for improved gait efficiency and reduced fall risk   Baseline: 1.95 ft/s w/o AD and min A  20.6 seconds with RW = 1.59 ft/sec on 12/13/22, with supervision/CGA Goal status: NOT MET  3.  Pt will perform single sit to stand without UE support w/CGA and no retropulsion for improved safety w/transfers and body mechanics   Baseline: mod A due to significant retropulsion, met on 12/13/22 Goal status: MET  4. Pt will improve TUG time to 17 seconds or less with LRAD in order to demo decrease fall risk. Baseline: 20 seconds no AD  18.4 seconds with RW on 12/13/22 Goal status: NOT MET  5.  Pt and wife will verbalize fall prevention techniques for home and community setting for reduced fall frequency and improved safety.  Baseline: Pt falling multiple times per week Goal status: INITIAL  6.  Pt will trial various assistive devices in clinic to determine safest option for mobility  Baseline: pt using RW  Goal status: MET    LONG TERM GOALS: Target date: 01/10/2023  Pt will be independent with final HEP for improved strength, balance, transfers and gait.  Baseline:  Goal status: INITIAL  2.  Pt will improve gait velocity to at least 2.6 ft/s w/LRAD and SBA for  improved gait efficiency and independence   Baseline: 1.95 ft/s w/o AD and min A Goal status: INITIAL  3.  Pt will improve TUG time to 15 seconds or less with LRAD and supervision  in order to demo decrease fall risk. Baseline: 20 seconds with no AD, CGA Goal status: INITIAL  4.  Pt will improve 5 x STS to less than or equal to 15 seconds w/good posture and form to demonstrate improved functional strength and transfer efficiency.   Baseline: 15.41s w/BUE support, significant posterior lean and flexed posture  Goal status: INITIAL  5.  Pt will trial various bracing options on RLE to reduce foot slap and fall frequency.  Baseline:  Goal status: INITIAL   ASSESSMENT:  CLINICAL IMPRESSION: Reviewed sit <> stand training today with  pt able to demo proper technique with scooting out towards edge and incr forward lean for anterior weight shift. With incr reps, and cues for larger amplitude, pt able to performed without BLE bracing against mat table. Worked on standing balance strategies on unlevel surfaces, with working on decr UE support, pt does better with balance when cued to slow down and focus on deliberate foot clearance and step height. On rockerboard, pt with incr weight posteriorly through heels. Will continue to progress towards LTGs.    OBJECTIVE IMPAIRMENTS: Abnormal gait, decreased balance, decreased cognition, decreased coordination, decreased knowledge of condition, decreased knowledge of use of DME, decreased mobility, difficulty walking, decreased strength, decreased safety awareness, impaired perceived functional ability, improper body mechanics, postural dysfunction, and pain  ACTIVITY LIMITATIONS: carrying, lifting, bending, standing, squatting, stairs, transfers, bed mobility, reach over head, locomotion level, and caring for others  PARTICIPATION LIMITATIONS: medication management, interpersonal relationship, driving, shopping, community activity, and yard  work  PERSONAL FACTORS: Age, Behavior pattern, Fitness, Past/current experiences, and 1 comorbidity: Vascular parkinsonism  are also affecting patient's functional outcome.   REHAB POTENTIAL: Fair due to diagnosis   CLINICAL DECISION MAKING: Evolving/moderate complexity  EVALUATION COMPLEXITY: Moderate  PLAN:  PT FREQUENCY: 2x/week  PT DURATION: 8 weeks  PLANNED INTERVENTIONS: Therapeutic exercises, Therapeutic activity, Neuromuscular re-education, Balance training, Gait training, Patient/Family education, Self Care, Joint mobilization, Stair training, Vestibular training, Canalith repositioning, Orthotic/Fit training, DME instructions, Manual therapy, and Re-evaluation  PLAN FOR NEXT SESSION:  Add walking program for endurance. Review freezing/turns. RLE foot clearance tasks. practice gait with RW. trial braces on RLE. Floor transfer    Drake Leach, PT, DPT 12/31/2022, 12:50 PM

## 2022-12-31 NOTE — Therapy (Signed)
OUTPATIENT SPEECH LANGUAGE PATHOLOGY [facility]   Patient Name: [name] MRN: [id number] DOB:1946/04/06, 77 y.o., male Today's Date: 12/31/2022  PCP: Ronnald Nian, MD REFERRING PROVIDER: Vladimir Faster, DO  END OF SESSION:  End of Session - 12/31/22 1018     Visit Number 6    Number of Visits 13    Date for SLP Re-Evaluation 01/24/23    Authorization Type U[employer]    Progress Note Due on Visit 10    SLP Start Time 1018    SLP Stop Time  1103    SLP Time Calculation (min) 45 min    Activity Tolerance Patient tolerated treatment well             Past Medical History:  Diagnosis Date   ADD (attention deficit disorder)    Allergic rhinitis    skin test 02/23/08 vaccine 2009; allergy shots weekly through Dr. Maple Hudson   Anemia    takes Fe -    Anginal pain (HCC)    Ankylosing spondylitis (HCC)    Anxiety    sees therapist Vernell Leep   Arthritis    Asthma    Dr. 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 Donnie Aho    CHEILECTOMY  10/16/2011   Procedure: CHEILECTOMY;  Surgeon: Sherri Rad, MD;  Location: [facility];  Service: Orthopedics;  Laterality: Right;   CHOLECYSTECTOMY     COLONOSCOPY     CORONARY ANGIOPLASTY     1995 after MI   CORONARY ANGIOPLASTY WITH STENT PLACEMENT     1999    ESOPHAGOGASTRODUODENOSCOPY  02/18/2012   Procedure: ESOPHAGOGASTRODUODENOSCOPY (EGD);  Surgeon: Florencia Reasons, MD;  Location: [facility];  Service: Endoscopy;  Laterality: N/A;   FOOT SURGERY     joint scraped, arthritis right foot   HERNIA REPAIR     LUMBAR DISC SURGERY     x2   right index finger  mass removed- arthritis     Dr. Amanda Pea   [facility]  Right 05/18/2021   medial toe consistent with callus   SKIN BIOPSY Left 05/17/2021   actinic keratosis   SPINAL CORD STIMULATOR BATTERY EXCHANGE N/A 10/11/2021   Procedure: SPINAL CORD STIMULATOR BATTERY EXCHANGE;  Surgeon: Venita Lick, MD;  Location: [facility] OR;  Service: Orthopedics;  Laterality: N/A;  60 mins   SPINAL CORD STIMULATOR INSERTION N/A 11/21/2016  Procedure: LUMBAR SPINAL CORD STIMULATOR INSERTION;  Surgeon: Venita Lick, MD;  Location: [facility] OR;  Service: Orthopedics;  Laterality: N/A;  Requests 2.5 hrs   TONSILLECTOMY     1953   UPPER GI ENDOSCOPY  [id number]   VASECTOMY     1980   Patient Active Problem List   Diagnosis Date Noted   OSA (obstructive sleep apnea) 11/12/2022   Parkinson's disease 11/12/2022   Snoring 10/18/2022   Peripheral vascular disease (HCC) 12/25/2021   Dizziness 12/24/2021   Leg pain, left 11/14/2021   Chronic low back pain with sciatica 11/14/2021   Spinal cord stimulator status 11/14/2021   Muscle spasm 11/14/2021   Constipation 09/11/2021   Slow transit constipation 09/11/2021   Cough 09/11/2021   Early satiety 09/11/2021   History of malignant neoplasm of colon 09/11/2021   Hoarseness 09/11/2021   Iron deficiency 09/11/2021   Personal history of colonic polyps 09/11/2021   Weight decreased  09/11/2021   Esophageal motility disorder 09/11/2021   Gastritis 09/11/2021   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   Pain in both feet 08/10/2021   Diverticulitis 02/26/2021   Atherosclerosis of aorta (HCC) 08/30/2020   Mild concentric left ventricular hypertrophy (LVH)    Hypogonadism male    History of kidney stones    Erectile dysfunction    COPD (chronic obstructive pulmonary disease) (HCC)    Acquired trigger finger of left middle finger 04/22/2019   Acquired trigger finger of right middle finger 04/22/2019   Left ventricular dysfunction 10/12/2018   Ankylosing spondylitis (HCC) 10/09/2018   Presbycusis of both ears 04/23/2018   Mild intermittent asthma without complication 04/23/2018   Headache 01/30/2017   Chronic pain 11/21/2016   Arthritis 10/15/2016   Spondylolisthesis of lumbar region 02/28/2015   Hyperlipidemia    Lumbar disc disease    Hypertension    CAD (coronary artery disease) 05/19/2012   GERD (gastroesophageal reflux disease)    Depression with anxiety    Chronic asthma 05/17/2011   Allergic rhinitis due to pollen 08/08/2010   History of benign positional vertigo    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 Had trouble taking my pills."   Pt accompanied by: self  PERTINENT HISTORY:PT with wife who supplements hx.  Patient was in the emergency room on April 1 with mental status change.  Patient had apparently had a URI and had been taking Mucinex and had some degree of confusion with the URI/medication.  Patient had gotten in the shower and his wife went into check on him 30 minutes later and found that he had fallen.  There was apparently no syncope and patient did not  hit his head.  Patient went to the hospital and was found to be hypokalemic at 2.3.  Patient did not have MRI because of spinal cord stimulator.  Patient followed up with primary care physician, at which point they noted that patient had been having a chronic change in gait with forward leaning trunk and gait for several months (perhaps longer per pt/wife)   PAIN:  Are you having pain? No  PATIENT GOALS: "get back to singing"  OBJECTIVE:   TODAY'S TREATMENT:  12/31/22: Patient coughed during drinking last session, passed the 3oz. Swallow test with no throat clear, education provided potential effects of PD on swallowing. Led through [employer] 5 of Speak Out! Patient required frequent mod-A of models and frequent feedback, and reminders to breathe through speaking. Education provided on prosody and noticing the difference on how his voice feels between yelling and speaking. Next session with start with SOVTE to warm up.  Sustained "ah":82 dB.    Reading: 72 dB.    Conversational average: participated in 5 minute unstructured conversation 64 dB. Patient's dB decreased from structured to unstructured conversation by 5 dB. Patient was able to increase his volume when visual cues were provided. At the end of the session, patient endorsed difference between hoarse and lower voice.   12/26/22: Entered with suboptimal conversational volume with average lower 60s dB. Targeted dysarthria through lesson 4 of Speak Out! Patient required frequent mod-A of frequent feedback, models, reminders to breathe, and visual feedback to increase volume. Patient states that he still has not received his Speak Out book, and another book will be sent. Patient endorses that hoarse voice decreases when he uses intentional voice.   Sustained "ah": 84 dB.    Reading: 73 dB.    Conversational average:  participated 5 min unstructured conversation averaging lower 60s dB. Next session will start with SOVTE.   12/20/22: Entered with suboptimal conversational volume with average fading from mid to low 60s dB with usual hoarseness noted. No attempts to correct volume decay or hoarseness demonstrated. To optimize vocal intensity and clarity, SLP led pt through lesson 3 of Speak Out!  Usual mod cues required to use intentional volume, rate, and breath support to maximize performance. Conversational sample of approximately 4 minutes, with pt averages high fading to mid 60s with occasional mod-A. Sustained "oh" 81 dB Reading 72 dB   12/17/22: Patient led through lesson 2 of Speak Out! Pt states that they still haven't received their Speak Out! Book. Education on slowing down while working through warm-ups in order to create sustained sound with good, diaphragmatic, breath support. Education given on layout of [employer], and how to speak with intent. Patient continued to want to rush through the work and required frequent mod-A for reminders to slow down and speak out. Pt benefiting from "ool" for sustained phonation and glides to improve vocal quality. Conversational sample of approximately 4 minutes, with pt averages mid to high 60s with occasional mod-A. Sustained "oh" 83 dB Reading 72 dB Cognitive speech task 71 dB.   12/13/22: Educated pt and wife on speech-related PD impairments with education provided on throat clear alternatives, diaphragmatic breathing (prior knowledge as singer), and targeted dysarthria program, Speak Out!, to address decline in conversational volume and hoarseness. Pt has not yet received Speak Out! Book. Targeted improving vocal quality and increasing intensity through progressively difficulty speech tasks using Speak Out! program, lesson 1. ST leads pt through exercises providing usual model prior to pt execution. usual mod-A required to achieve target dB this date, particularly  to optimize breath support. Increased awareness of volume decay and decreased breath support exhibited as session progressed. Averaged this date:  Sustained "ah" 83 dB Reading 72 dB Cognitive speech task 71 dB.  Conversational sample of approx 3 minutes, pt averages upper 60s dB with occasional mod-A.   11/29/22: Collaborated with patient and spouse to generate POC and goals. Initiated training in use of intentional voice to optimize vocal intensity. Use of verbal cues and models effective  in increasing volume and resolving volume decay noted as baseline. Introduced speak out program to pt and spouse.   PATIENT EDUCATION: Education details: see above.  Person educated: Patient and Spouse Education method: Medical illustrator Education comprehension: verbalized understanding and returned demonstration  HOME EXERCISE PROGRAM: Speak Out!    GOALS: Goals reviewed with patient? Yes  SHORT TERM GOALS: Target date: 12/27/2022  Pt will complete HEP at least 1x/day given occasional min A over 2 sessions  Baseline: Goal status: Deferred--Pt has not received Speak Out! Book yet.   2.  Pt will meet or exceed dB targets during reading exercises with 90% accuracy with rare min-A.  Baseline:  Goal status: MET  3.  Pt will demonstrate SOVTE and vocal function exercises with mod-I.  Baseline:  Goal status: MET  LONG TERM GOALS: Target date: 01/10/2023  1.  Pt and spouse will report reduced need to repeat information at home > 1 week.  Baseline: 21.5/32 Goal status: IN PROGRESS  2.  Pt will utilize dysarthria compensations in 5-10 minute conversation and maintain WNL conversational volume (70-72 dB) given occasional min A over 2 sessions   Baseline:  Goal status: IN PROGRESS  3.  Pt will demonstrate intentional voice and flow phonation during singing exercise with min-A  Baseline:  Goal status: IN PROGRESS   4.  Pt will report improvement via PROM by d/c   Baseline:  CPIB=21.5  Goal status: IN PROGRESS   ASSESSMENT:  CLINICAL IMPRESSION: Patient is a 77 y.o. M who was seen today for motor speech s/p hoarse voice and decreased volume. Patient was able to increase volume upon initiation of phrases but needed prompting to keep his volume up during long, unstructured conversation. Patient encouraged to use his "presenting" voice to speak up and out with intent. Pt's speech is c/b intermittent hoarse vocal quality, reduced vocal intensity, volume decay. Conducted ongoing education and training of Speak Out! Program to maximize volume intensity and clarity with usual mod A required at this time to achieve targeted dB levels. Pt would benefit from skilled ST to address aforementioned deficits to enhance communication efficacy.    OBJECTIVE IMPAIRMENTS: Objective impairments include dysarthria. These impairments are limiting patient from effectively communicating at home and in community.Factors affecting potential to achieve goals and functional outcome are co-morbidities and medical prognosis. Patient will benefit from skilled SLP services to address above impairments and improve overall function.  REHAB POTENTIAL: Excellent  PLAN:  SLP FREQUENCY: 1-2x/week  SLP DURATION: 8 weeks   PLANNED INTERVENTIONS: Cueing hierachy, Oral motor exercises, SLP instruction and feedback, Compensatory strategies, and Patient/family education  [place], Student-SLP 12/31/2022, 10:44 AM

## 2023-01-01 ENCOUNTER — Encounter: Payer: Self-pay | Admitting: Family Medicine

## 2023-01-01 ENCOUNTER — Ambulatory Visit (INDEPENDENT_AMBULATORY_CARE_PROVIDER_SITE_OTHER): Payer: Medicare Other | Admitting: Family Medicine

## 2023-01-01 VITALS — BP 120/70 | HR 85 | Temp 97.5°F | Resp 14 | Ht 66.0 in | Wt 154.8 lb

## 2023-01-01 DIAGNOSIS — M450 Ankylosing spondylitis of multiple sites in spine: Secondary | ICD-10-CM

## 2023-01-01 DIAGNOSIS — E782 Mixed hyperlipidemia: Secondary | ICD-10-CM

## 2023-01-01 DIAGNOSIS — M1990 Unspecified osteoarthritis, unspecified site: Secondary | ICD-10-CM

## 2023-01-01 DIAGNOSIS — J4530 Mild persistent asthma, uncomplicated: Secondary | ICD-10-CM | POA: Diagnosis not present

## 2023-01-01 DIAGNOSIS — I25118 Atherosclerotic heart disease of native coronary artery with other forms of angina pectoris: Secondary | ICD-10-CM | POA: Diagnosis not present

## 2023-01-01 DIAGNOSIS — J4520 Mild intermittent asthma, uncomplicated: Secondary | ICD-10-CM

## 2023-01-01 DIAGNOSIS — F909 Attention-deficit hyperactivity disorder, unspecified type: Secondary | ICD-10-CM | POA: Diagnosis not present

## 2023-01-01 DIAGNOSIS — Z9689 Presence of other specified functional implants: Secondary | ICD-10-CM

## 2023-01-01 DIAGNOSIS — J45909 Unspecified asthma, uncomplicated: Secondary | ICD-10-CM

## 2023-01-01 DIAGNOSIS — I700 Atherosclerosis of aorta: Secondary | ICD-10-CM | POA: Diagnosis not present

## 2023-01-01 DIAGNOSIS — J301 Allergic rhinitis due to pollen: Secondary | ICD-10-CM

## 2023-01-01 DIAGNOSIS — Z0000 Encounter for general adult medical examination without abnormal findings: Secondary | ICD-10-CM

## 2023-01-01 DIAGNOSIS — G4733 Obstructive sleep apnea (adult) (pediatric): Secondary | ICD-10-CM

## 2023-01-01 DIAGNOSIS — J439 Emphysema, unspecified: Secondary | ICD-10-CM

## 2023-01-01 DIAGNOSIS — K219 Gastro-esophageal reflux disease without esophagitis: Secondary | ICD-10-CM

## 2023-01-01 DIAGNOSIS — F418 Other specified anxiety disorders: Secondary | ICD-10-CM

## 2023-01-01 DIAGNOSIS — E611 Iron deficiency: Secondary | ICD-10-CM

## 2023-01-01 DIAGNOSIS — H9113 Presbycusis, bilateral: Secondary | ICD-10-CM

## 2023-01-01 DIAGNOSIS — I10 Essential (primary) hypertension: Secondary | ICD-10-CM

## 2023-01-01 MED ORDER — DILTIAZEM HCL ER COATED BEADS 240 MG PO CP24: 240.0000 mg | ORAL_CAPSULE | Freq: Every day | ORAL | 3 refills | Status: DC

## 2023-01-01 MED ORDER — ATORVASTATIN CALCIUM 20 MG PO TABS
20.0000 mg | ORAL_TABLET | Freq: Every day | ORAL | 3 refills | Status: DC
Start: 2023-01-01 — End: 2024-01-13

## 2023-01-01 MED ORDER — METHYLPHENIDATE HCL ER (OSM) 54 MG PO TBCR: 54.0000 mg | EXTENDED_RELEASE_TABLET | ORAL | 0 refills | Status: DC

## 2023-01-01 MED ORDER — AZELASTINE HCL 0.1 % NA SOLN
2.0000 | Freq: Two times a day (BID) | NASAL | 3 refills | Status: DC
Start: 2023-01-01 — End: 2024-01-13

## 2023-01-01 MED ORDER — PANTOPRAZOLE SODIUM 40 MG PO TBEC: 40.0000 mg | DELAYED_RELEASE_TABLET | Freq: Every day | ORAL | 3 refills | Status: DC

## 2023-01-01 MED ORDER — ALBUTEROL SULFATE HFA 108 (90 BASE) MCG/ACT IN AERS
2.0000 | INHALATION_SPRAY | Freq: Four times a day (QID) | RESPIRATORY_TRACT | 2 refills | Status: DC | PRN
Start: 2023-01-01 — End: 2024-01-13

## 2023-01-01 MED ORDER — DESVENLAFAXINE SUCCINATE ER 50 MG PO TB24: 50.0000 mg | ORAL_TABLET | Freq: Every day | ORAL | 3 refills | Status: DC

## 2023-01-01 MED ORDER — BUPROPION HCL ER (XL) 300 MG PO TB24: 300.0000 mg | ORAL_TABLET | Freq: Every day | ORAL | 1 refills | Status: DC

## 2023-01-01 MED ORDER — UMECLIDINIUM-VILANTEROL 62.5-25 MCG/ACT IN AEPB
1.0000 | INHALATION_SPRAY | Freq: Every day | RESPIRATORY_TRACT | 3 refills | Status: DC
Start: 2023-01-01 — End: 2024-01-13

## 2023-01-01 NOTE — Patient Instructions (Signed)
Use the Anoro daily and the albuterol as needed.  Get the CPAP started continue with your physical therapy and speech therapy.  Follow-up with Dr. Shon Baton concerning years back stimulator

## 2023-01-01 NOTE — Progress Notes (Signed)
Complete physical exam  Patient: [name]   DOB: 1946/05/08   77 y.o. Male  MRN: [id number]  Subjective:    Chief Complaint  Patient presents with   Annual Exam    CPE. Had AWV with [facility] on 12/17/2022.     [name] is a 77 y.o. male who presents today for a complete physical exam.  He reports consuming a general diet. The patient does not participate in regular exercise at present. He generally feels fairly well. He reports sleeping fairly well although he has evidence of OSA and is reluctant to get on a CPAP.  He has a history of vascular Parkinson's and is followed by neurology for that.  He is using a walker regularly as he is having difficulty with falls.  He is being seen by physical therapy and helped with PT as well as speech therapy.  Continues to be followed by Dr. Shon Baton and Dr. Horald Chestnut for his low back pain.  He does have a stimulator in.  Continues on Concerta for his underlying ADHD.  His reflux seems to be under good control.  He continues to intermittently use meclizine for his dizziness.  His reflux seems to be under pretty good control.  He continues on his allergy medications.  He is not taking his inhalers appropriately.  He does have a previous history of iron deficiency and is taking a good multivitamin with extra iron.  He does wear hearing aids.  He is scheduled for follow-up visit with cardiology.  He stopped taking his Lipitor apparently due to muscle aches and pains but on muscle aches and pains did not go away, he did not start taking the Lipitor again.  Most recent fall risk assessment:    12/17/2022    9:35 AM  Fall Risk   Falls in the past year? 1  Comment fell in shower, missed step  Number falls in past yr: 1  Injury with Fall? 0  Risk for fall due to : History of fall(s);Impaired balance/gait;Impaired mobility;Medication side effect  Follow up Falls prevention discussed;Falls evaluation completed     Most recent depression  screenings:    12/17/2022    9:38 AM 05/15/2022    9:12 AM  PHQ 2/9 Scores  PHQ - 2 Score 0 1  PHQ- 9 Score 0 3    Vision:Within last year and Dental: Receives regular dental care    Immunization History  Administered Date(s) Administered   COVID-19, mRNA, vaccine(Comirnaty)12 years and older 05/15/2022   Fluad Quad(high Dose 65+) 04/14/2020, 03/13/2021, 05/15/2022   Influenza Split 04/23/2011   Influenza Whole 03/20/2009, 03/19/2010   Influenza, High Dose Seasonal PF 04/12/2016, 02/26/2017, 04/02/2018, 01/30/2019, 01/30/2019   Influenza,inj,Quad PF,6+ Mos 03/05/2013, 02/23/2014, 04/21/2015   Influenza-Unspecified 04/02/2018, 01/30/2019   PFIZER Comirnaty(Gray Top)Covid-19 Tri-Sucrose Vaccine 06/22/2020   PFIZER(Purple Top)SARS-COV-2 Vaccination 07/02/2019, 07/23/2019   Pfizer Covid-19 Vaccine Bivalent Booster 26yrs & up 03/13/2021   Pneumococcal Conjugate-13 03/04/2014   Pneumococcal Polysaccharide-23 06/10/2005, 05/19/2012   Td 10/20/2006   Tdap 10/20/2006, 02/26/2017   Zoster Recombinant(Shingrix) 12/10/2016, 07/15/2017   Zoster, Live 01/01/2007    Health Maintenance  Topic Date Due   COVID-19 Vaccine (6 - 2023-24 season) 09/14/2022   INFLUENZA VACCINE  01/09/2023   Medicare Annual Wellness (AWV)  12/17/2023   DTaP/Tdap/Td (4 - Td or Tdap) 02/27/2027   Pneumonia Vaccine 45+ Years old  Completed   Hepatitis C Screening  Completed   Zoster Vaccines- Shingrix  Completed   HPV  VACCINES  Aged Out   Colonoscopy  Discontinued    Patient Care Team: Ronnald Nian, MD as PCP - General (Family Medicine) Dulce Sellar Iline Oven, MD as PCP - Cardiology (Cardiology) Othella Boyer, MD as Consulting Physician (Cardiology) Waymon Budge, MD as Consulting Physician (Pulmonary Disease) Tat, Octaviano Batty, DO as Consulting Physician (Neurology)   Outpatient Medications Prior to Visit  Medication Sig   acetaminophen (TYLENOL) 500 MG tablet Take 1,000 mg by mouth every 6 (six) hours as  needed.   ALPRAZolam (XANAX) 0.25 MG tablet TAKE 1 TABLET BY MOUTH 2 TIMES DAILY AS NEEDED.   amoxicillin (AMOXIL) 500 MG capsule TAKE 4 CAPSULES BY MOUTH 1 HOUR PRIOR TO DENTAL APPOINTMENT   calcium carbonate (OS-CAL) 600 MG TABS tablet Take 600 mg by mouth daily with breakfast.   carbidopa-levodopa (SINEMET IR) 25-100 MG tablet Take 1 tablet by mouth 3 (three) times daily. 8am/noon/4pm   cetirizine (ZYRTEC) 10 MG tablet Take 10 mg by mouth daily.   clopidogrel (PLAVIX) 75 MG tablet Take 1 tablet (75 mg total) by mouth daily.   ferrous sulfate 325 (65 FE) MG tablet Take 325 mg by mouth daily.   Flaxseed, Linseed, (FLAX SEED OIL) 1000 MG CAPS Take 1,000 mg by mouth every 7 (seven) days.   GLUCOSAMINE CHONDROITIN COMPLX PO Take 1 tablet by mouth daily.   guaiFENesin (MUCINEX) 600 MG 12 hr tablet Take 600 mg by mouth 2 (two) times daily as needed for cough.   meclizine (ANTIVERT) 25 MG tablet 1 twice a day as needed for dizziness   Multiple Vitamins-Minerals (MULTIVITAMIN WITH MINERALS) tablet Take 1 tablet by mouth daily.   sucralfate (CARAFATE) 1 GM/10ML suspension Take 10 mLs (1 g total) by mouth daily.   [DISCONTINUED] azelastine (ASTELIN) 0.1 % nasal spray Place 2 sprays into both nostrils 2 (two) times daily. Use in each nostril as directed   [DISCONTINUED] buPROPion (WELLBUTRIN XL) 300 MG 24 hr tablet TAKE 1 TABLET BY MOUTH DAILY   [DISCONTINUED] desvenlafaxine (PRISTIQ) 50 MG 24 hr tablet TAKE 1 TABLET BY MOUTH  DAILY   [DISCONTINUED] diltiazem (CARDIZEM CD) 240 MG 24 hr capsule Take 1 capsule (240 mg total) by mouth daily.   [DISCONTINUED] methylphenidate (CONCERTA) 54 MG PO CR tablet Take 1 tablet (54 mg total) by mouth every morning.   [DISCONTINUED] pantoprazole (PROTONIX) 40 MG tablet TAKE 1 TABLET BY MOUTH EVERY DAY   [DISCONTINUED] PROAIR HFA 108 (90 Base) MCG/ACT inhaler TAKE 2 PUFFS BY MOUTH EVERY 4 HOURS AS NEEDED   [DISCONTINUED] umeclidinium-vilanterol (ANORO ELLIPTA) 62.5-25  MCG/INH AEPB INHALE 1 PUFF BY MOUTH EVERY DAY   No facility-administered medications prior to visit.    Review of Systems  All other systems reviewed and are negative.   Family and social history as well as health maintenance and immunizations was reviewed.     Objective:    BP 120/70   Pulse 85   Temp (!) 97.5 F (36.4 C) (Oral)   Resp 14   Ht 5\' 6"  (1.676 m)   Wt 154 lb 12.8 oz (70.2 kg)   SpO2 98% Comment: room air  BMI 24.99 kg/m    Physical Exam  Alert and in no distress. Tympanic membranes and canals are normal. Pharyngeal area is normal. Neck is supple without adenopathy or thyromegaly. Cardiac exam shows a regular sinus rhythm without murmurs or gallops. Lungs are clear to auscultation.      Assessment & Plan:    Discussed health benefits  of physical activity, and encouraged him to engage in regular exercise appropriate for his age and condition.  Allergic rhinitis due to pollen, unspecified seasonality  Atherosclerosis of aorta (HCC) - Plan: atorvastatin (LIPITOR) 20 MG tablet explained that if he has difficulty with Lipitor causing aches and pains to please call so we can discuss this in more detail.  Ankylosing spondylitis of multiple sites in spine [facility])  Arthritis: Explained he can take Tylenol for his aches and pains.  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  Explained the need to use Anoro on a regular basis and only rarely uses ProAir. Pulmonary emphysema, unspecified emphysema type (HCC)  Gastroesophageal reflux disease, unspecified whether esophagitis present  Mixed  hyperlipidemia  Primary hypertension - Plan: diltiazem (CARDIZEM CD) 240 MG 24 hr capsule  Iron deficiency: Continue on iron supplementation.  OSA (obstructive sleep apnea): Strongly encouraged him to try the CPAP explained that this could also help with some the other problems that he is having especially with cognitive issues, strength and stamina.  Presbycusis of both ears  Spinal cord stimulator status: Continue follow-up with Dr. Shon Baton.  Chronic asthma without complication, unspecified asthma severity, unspecified whether persistent - Plan: azelastine (ASTELIN) 0.1 % nasal spray  Depression with anxiety - Plan: buPROPion (WELLBUTRIN XL) 300 MG 24 hr tablet, desvenlafaxine (PRISTIQ) 50 MG 24 hr tablet  Gastroesophageal reflux disease without esophagitis - Plan: pantoprazole (PROTONIX) 40 MG tablet  Mild intermittent asthma without complication - Plan: albuterol (PROAIR HFA) 108 (90 Base) MCG/ACT inhaler   Follow-up as needed.  Also discussed the possibility of him eventually not driving anymore.  Handicap placard was filled out for 6 months.     Sharlot Gowda, MD

## 2023-01-03 ENCOUNTER — Ambulatory Visit: Payer: Medicare Other | Admitting: Speech Pathology

## 2023-01-03 ENCOUNTER — Ambulatory Visit: Payer: Medicare Other | Admitting: Physical Therapy

## 2023-01-03 DIAGNOSIS — R293 Abnormal posture: Secondary | ICD-10-CM

## 2023-01-03 DIAGNOSIS — R2689 Other abnormalities of gait and mobility: Secondary | ICD-10-CM | POA: Diagnosis not present

## 2023-01-03 DIAGNOSIS — R471 Dysarthria and anarthria: Secondary | ICD-10-CM

## 2023-01-03 DIAGNOSIS — R2681 Unsteadiness on feet: Secondary | ICD-10-CM

## 2023-01-03 NOTE — Patient Instructions (Signed)
  Semi-occluded vocal tract exercises (SOVTE) "Straw exercise"  These allow your vocal folds to vibrate without excess tension and promotes high placement of the voice  Use SOVTE as a warm up before prolonged speaking and vocal exercises  Goal is to take good breaths, and make sure you have a steady stream of bubbles.    High resistance: voicing through a stirring straw  Medium resistance: voicing through a drinking straw  Less resistance: Voiced /v/                            Lip or Tongue Trill                            Nasal "hums" /m/ and /n/                            Vowels /u/ and ee  Watch Vocal Straw Exercises with Lenox Ahr on YouTube:  [email or web address]  Pitch Glides for 2 minutes  Accents (siren)  Hum [employer]  A goal would be 2-3 minutes several times a day and prior to vocal exercises  As always, use good belly breathing while completing SOVTE

## 2023-01-03 NOTE — Therapy (Signed)
OUTPATIENT SPEECH LANGUAGE PATHOLOGY [facility]   Patient Name: [name] MRN: [id number] DOB:1945-09-21, 77 y.o., male Today's Date: 01/03/2023  PCP: Ronnald Nian, MD REFERRING PROVIDER: Vladimir Faster, DO  END OF SESSION:  End of Session - 01/03/23 1015     Visit Number 7    Number of Visits 13    Date for SLP Re-Evaluation 01/24/23    Authorization Type [employer]    Progress Note Due on Visit 10    SLP Start Time 1015    SLP Stop Time  1100    SLP Time Calculation (min) 45 min    Activity Tolerance Patient tolerated treatment well              Past Medical History:  Diagnosis Date   ADD (attention deficit disorder)    Allergic rhinitis    skin test 02/23/08 vaccine 2009; allergy shots weekly through Dr. Maple Hudson   Anemia    takes Fe -    Anginal pain (HCC)    Ankylosing spondylitis (HCC)    Anxiety    sees therapist Vernell Leep   Arthritis    Asthma    Dr. 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  Donnie Aho   CHEILECTOMY  10/16/2011   Procedure: CHEILECTOMY;  Surgeon: Sherri Rad, MD;  Location: [facility];  Service: Orthopedics;  Laterality: Right;   CHOLECYSTECTOMY     COLONOSCOPY     CORONARY ANGIOPLASTY     1995 after MI   CORONARY ANGIOPLASTY WITH STENT PLACEMENT     1999    ESOPHAGOGASTRODUODENOSCOPY  02/18/2012   Procedure: ESOPHAGOGASTRODUODENOSCOPY (EGD);  Surgeon: Florencia Reasons, MD;  Location: [facility];  Service: Endoscopy;  Laterality: N/A;   FOOT SURGERY     joint scraped, arthritis right foot   HERNIA REPAIR     LUMBAR DISC SURGERY     x2   right index finger  mass removed- arthritis     Dr. Amanda Pea   [facility]  Right 05/18/2021   medial toe consistent with callus   SKIN BIOPSY Left 05/17/2021   actinic keratosis   SPINAL CORD STIMULATOR BATTERY EXCHANGE N/A 10/11/2021   Procedure: SPINAL CORD STIMULATOR BATTERY EXCHANGE;  Surgeon: Venita Lick, MD;  Location: [facility] OR;  Service: Orthopedics;  Laterality: N/A;  60 mins   SPINAL CORD STIMULATOR INSERTION N/A 11/21/2016  Procedure: LUMBAR SPINAL CORD STIMULATOR INSERTION;  Surgeon: Venita Lick, MD;  Location: [facility] OR;  Service: Orthopedics;  Laterality: N/A;  Requests 2.5 hrs   TONSILLECTOMY     1953   UPPER GI ENDOSCOPY  [id number]   VASECTOMY     1980   Patient Active Problem List   Diagnosis Date Noted   OSA (obstructive sleep apnea) 11/12/2022   Parkinson's disease 11/12/2022   Peripheral vascular disease (HCC) 12/25/2021   Dizziness 12/24/2021   Leg pain, left 11/14/2021   Chronic low back pain with sciatica 11/14/2021   Spinal cord stimulator status 11/14/2021   Muscle spasm 11/14/2021   Constipation 09/11/2021   Slow transit constipation 09/11/2021   Cough 09/11/2021   Early satiety 09/11/2021   Hoarseness 09/11/2021   Iron deficiency 09/11/2021   Personal history of colonic polyps 09/11/2021   Weight decreased 09/11/2021   Esophageal motility disorder 09/11/2021    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   Pain in both feet 08/10/2021   Atherosclerosis of aorta (HCC) 08/30/2020   Mild concentric left ventricular hypertrophy (LVH)    Hypogonadism male    History of kidney stones    Erectile dysfunction    COPD (chronic obstructive pulmonary disease) (HCC)    Acquired trigger finger of left middle finger 04/22/2019   Acquired trigger finger of right middle finger 04/22/2019   Left ventricular dysfunction 10/12/2018   Ankylosing spondylitis (HCC) 10/09/2018   Presbycusis of both ears 04/23/2018   Mild intermittent asthma without complication 04/23/2018   Chronic pain 11/21/2016   Arthritis 10/15/2016   Spondylolisthesis of lumbar region 02/28/2015   Hyperlipidemia    Lumbar disc disease    Hypertension    CAD (coronary artery disease) 05/19/2012   GERD (gastroesophageal reflux disease)    Depression with anxiety    Chronic asthma 05/17/2011   Allergic rhinitis due to pollen 08/08/2010   History of benign positional vertigo    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  Patient was in the emergency room on April 1 with mental status change.  Patient had apparently had a URI and had been taking Mucinex and had some degree of confusion with the URI/medication.  Patient had gotten in the shower and his wife went into check on him 30 minutes later and found that he had fallen.  There was apparently no syncope and patient did not hit his head.  Patient went to the hospital and was found to be hypokalemic at 2.3.  Patient did not have MRI because of spinal cord stimulator.  Patient  followed up with primary care physician, at which point they noted that patient had been having a chronic change in gait with forward leaning trunk and gait for several months (perhaps longer per pt/wife)   PAIN:  Are you having pain? No  PATIENT GOALS: "get back to singing"  OBJECTIVE:   TODAY'S TREATMENT:  01/03/23: Education on SOVTE with demonstration provided, patient demonstrated understanding. Required frequent mod-A to breathe throughout and keep the bubbles steady throughout phonation. Patient was able to improve breath support with models and immediate feedback. Handout given with SOVTE and HEP to warm up with straw before extended speaking engagements. All questions answered to patient satisfaction.  Sustained "ah":78 dB.    Reading: 70 dB.    Conversational average: participated in 5 minute conversation with 65 dB average.   12/31/22: Patient coughed during drinking last session, passed the 3oz. Swallow test with no throat clear, education provided potential effects of PD on swallowing. Led through [employer] 5 of Speak Out! Patient required frequent mod-A of models and frequent feedback, and reminders to breathe through speaking. Education provided on prosody and noticing the difference on how his voice feels between yelling and speaking. Next session with start with SOVTE to warm up.  Sustained "ah":82 dB.    Reading: 72 dB.    Conversational average: participated in 5 minute unstructured conversation 64 dB. Patient's dB decreased from structured to unstructured conversation by 5 dB. Patient was able to increase his volume when visual cues were provided. At the end of the session, patient endorsed difference between hoarse and lower voice.   12/26/22: Entered with suboptimal conversational volume with average lower 60s dB. Targeted dysarthria through lesson  4 of Speak Out! Patient required frequent mod-A of frequent feedback, models, reminders to breathe, and visual feedback to increase volume. Patient states that he still has not received his Speak Out book, and another book will be sent. Patient endorses that hoarse voice decreases when he uses intentional voice.   Sustained "ah": 84 dB.    Reading: 73 dB.    Conversational average: participated 5 min unstructured conversation averaging lower 60s dB. Next session will start with SOVTE.   12/20/22: Entered with suboptimal conversational volume with average fading from mid to low 60s dB with usual hoarseness noted. No attempts to correct volume decay or hoarseness demonstrated. To optimize vocal intensity and clarity, SLP led pt through lesson 3 of Speak Out!  Usual mod cues required to use intentional volume, rate, and breath support to maximize performance. Conversational sample of approximately 4 minutes, with pt averages high fading to mid 60s with occasional mod-A. Sustained "oh" 81 dB Reading 72 dB   12/17/22: Patient led through lesson 2 of Speak Out! Pt states that they still haven't received their Speak Out! Book. Education on slowing down while working through warm-ups in order to create sustained sound with good, diaphragmatic, breath support. Education given on layout of [employer], and how to speak with intent. Patient continued to want to rush through the work and required frequent mod-A for reminders to slow down and speak out. Pt benefiting from "ool" for sustained phonation and glides to improve vocal quality. Conversational sample of approximately 4 minutes, with pt averages mid to high 60s with occasional mod-A. Sustained "oh" 83 dB Reading 72 dB Cognitive speech task 71 dB.   12/13/22: Educated pt and wife on speech-related PD impairments with education provided on throat clear alternatives, diaphragmatic breathing (prior knowledge as singer), and targeted dysarthria program, Speak  Out!, to address decline in conversational volume and hoarseness. Pt has not yet received Speak Out! Book. Targeted improving vocal quality and increasing intensity through progressively difficulty speech tasks using Speak Out! program, lesson 1. ST leads pt through exercises providing usual model prior to pt execution. usual mod-A required to achieve target dB  this date, particularly to optimize breath support. Increased awareness of volume decay and decreased breath support exhibited as session progressed. Averaged this date:  Sustained "ah" 83 dB Reading 72 dB Cognitive speech task 71 dB.  Conversational sample of approx 3 minutes, pt averages upper 60s dB with occasional mod-A.   11/29/22: Collaborated with patient and spouse to generate POC and goals. Initiated training in use of intentional voice to optimize vocal intensity. Use of verbal cues and models effective in increasing volume and resolving volume decay noted as baseline. Introduced speak out program to pt and spouse.   PATIENT EDUCATION: Education details: see above.  Person educated: Patient and Spouse Education method: Medical illustrator Education comprehension: verbalized understanding and returned demonstration  HOME EXERCISE PROGRAM: Speak Out!    GOALS: Goals reviewed with patient? Yes  SHORT TERM GOALS: Target date: 12/27/2022  Pt will complete HEP at least 1x/day given occasional min A over 2 sessions  Baseline: Goal status: Deferred--Pt has not received Speak Out! Book yet.   2.  Pt will meet or exceed dB targets during reading exercises with 90% accuracy with rare min-A.  Baseline:  Goal status: MET  3.  Pt will demonstrate SOVTE and vocal function exercises with mod-I.  Baseline:  Goal status: MET  LONG TERM GOALS: Target date: 01/10/2023  1.  Pt and spouse will report reduced need to repeat information at home > 1 week.  Baseline: 21.5/32 Goal status: IN PROGRESS  2.  Pt will utilize  dysarthria compensations in 5-10 minute conversation and maintain WNL conversational volume (70-72 dB) given occasional min A over 2 sessions   Baseline:  Goal status: IN PROGRESS  3.  Pt will demonstrate intentional voice and flow phonation during singing exercise with min-A  Baseline:  Goal status: IN PROGRESS   4.  Pt will report improvement via PROM by d/c   Baseline: CPIB=21.5  Goal status: IN PROGRESS   ASSESSMENT:  CLINICAL IMPRESSION: Patient is a 77 y.o. M who was seen today for motor speech s/p hoarse voice and decreased volume. Patient was able to increase volume upon initiation of phrases but needed prompting to keep his volume up during long, unstructured conversation. Patient encouraged to use his "presenting" voice to speak up and out with intent. Pt's speech is c/b intermittent hoarse vocal quality, reduced vocal intensity, volume decay. Conducted ongoing education and training of Speak Out! Program to maximize volume intensity and clarity with usual mod A required at this time to achieve targeted dB levels. Pt would benefit from skilled ST to address aforementioned deficits to enhance communication efficacy.    OBJECTIVE IMPAIRMENTS: Objective impairments include dysarthria. These impairments are limiting patient from effectively communicating at home and in community.Factors affecting potential to achieve goals and functional outcome are co-morbidities and medical prognosis. Patient will benefit from skilled SLP services to address above impairments and improve overall function.  REHAB POTENTIAL: Excellent  PLAN:  SLP FREQUENCY: 1-2x/week  SLP DURATION: 8 weeks   PLANNED INTERVENTIONS: Cueing hierachy, Oral motor exercises, SLP instruction and feedback, Compensatory strategies, and Patient/family education  [place], Student-SLP 01/03/2023, 10:16 AM

## 2023-01-03 NOTE — Therapy (Signed)
OUTPATIENT PHYSICAL THERAPY NEURO TREATMENT   Patient Name: [name] MRN: [id number] DOB:04/06/1946, 77 y.o., male Today's Date: 01/03/2023   PCP: Ronnald Nian, MD  REFERRING PROVIDER: Vladimir Faster, DO  END OF SESSION:  PT End of Session - 01/03/23 1105     Visit Number 14    Number of Visits 17   Plus eval   Date for PT Re-Evaluation 01/10/23    Authorization Type [employer]    Progress Note Due on Visit 10    PT Start Time 1105   Pt in restroom   PT Stop Time 1151    PT Time Calculation (min) 46 min    Equipment Utilized During Treatment Gait belt    Activity Tolerance Patient tolerated treatment well    Behavior During Therapy WFL for tasks assessed/performed                  Past Medical History:  Diagnosis Date   ADD (attention deficit disorder)    Allergic rhinitis    skin test 02/23/08 vaccine 2009; allergy shots weekly through Dr. Maple Hudson   Anemia    takes Fe -    Anginal pain (HCC)    Ankylosing spondylitis (HCC)    Anxiety    sees therapist Vernell Leep   Arthritis    Asthma    Dr. 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 Donnie Aho   CHEILECTOMY  10/16/2011   Procedure: CHEILECTOMY;  Surgeon: Sherri Rad, MD;  Location: [facility];  Service: Orthopedics;  Laterality: Right;   CHOLECYSTECTOMY     COLONOSCOPY     CORONARY ANGIOPLASTY     1995 after MI   CORONARY ANGIOPLASTY WITH STENT PLACEMENT     1999    ESOPHAGOGASTRODUODENOSCOPY  02/18/2012   Procedure: ESOPHAGOGASTRODUODENOSCOPY (EGD);  Surgeon: Florencia Reasons, MD;  Location: [facility];  Service: Endoscopy;  Laterality: N/A;   FOOT SURGERY     joint scraped, arthritis right foot   HERNIA REPAIR     LUMBAR DISC SURGERY     x2   right index finger  mass removed- arthritis     Dr. Amanda Pea   [facility]  Right 05/18/2021   medial toe consistent with callus   SKIN BIOPSY Left 05/17/2021   actinic keratosis   SPINAL CORD STIMULATOR BATTERY EXCHANGE N/A 10/11/2021   Procedure:  SPINAL CORD STIMULATOR BATTERY EXCHANGE;  Surgeon: Venita Lick, MD;  Location: [facility];  Service: Orthopedics;  Laterality: N/A;  60 mins   SPINAL CORD STIMULATOR INSERTION N/A 11/21/2016   Procedure: LUMBAR SPINAL CORD STIMULATOR INSERTION;  Surgeon: Venita Lick, MD;  Location: [facility] OR;  Service: Orthopedics;  Laterality: N/A;  Requests 2.5 hrs   TONSILLECTOMY     1953   UPPER GI ENDOSCOPY  [id number]   VASECTOMY     1980   Patient Active Problem List   Diagnosis Date Noted   OSA (obstructive sleep apnea) 11/12/2022   Parkinson's disease 11/12/2022   Peripheral vascular disease (HCC) 12/25/2021   Dizziness 12/24/2021   Leg pain, left 11/14/2021   Chronic low back pain with sciatica 11/14/2021   Spinal cord stimulator status 11/14/2021   Muscle spasm 11/14/2021   Constipation 09/11/2021   Slow transit constipation 09/11/2021   Cough 09/11/2021   Early satiety 09/11/2021   Hoarseness 09/11/2021   Iron deficiency 09/11/2021    Personal history of colonic polyps 09/11/2021   Weight decreased 09/11/2021   Esophageal motility disorder 09/11/2021   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   Pain in both feet 08/10/2021   Atherosclerosis of aorta (HCC) 08/30/2020   Mild concentric left ventricular hypertrophy (LVH)    Hypogonadism male    History of kidney stones    Erectile dysfunction    COPD (chronic obstructive pulmonary disease) (HCC)    Acquired trigger finger of left middle finger 04/22/2019   Acquired trigger finger of right middle finger 04/22/2019   Left ventricular dysfunction 10/12/2018   Ankylosing spondylitis (HCC) 10/09/2018   Presbycusis of both ears 04/23/2018   Mild intermittent asthma without complication 04/23/2018   Chronic pain 11/21/2016   Arthritis 10/15/2016   Spondylolisthesis of lumbar region 02/28/2015   Hyperlipidemia    Lumbar disc disease    Hypertension    CAD (coronary artery disease) 05/19/2012   GERD (gastroesophageal reflux disease)    Depression with anxiety    Chronic asthma 05/17/2011   Allergic rhinitis due to pollen 08/08/2010   History of benign positional vertigo    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 Denies falls or pain.   Pt accompanied by:  Wife, [name]  PERTINENT HISTORY: Pt falls frequently, always in forward direction and insists on using back stairs despite frequent falls over top step. Started Sinemet week of  11/04/22  PAIN:  Are you having pain? No  There were no vitals filed for this visit.    PRECAUTIONS: Fall  WEIGHT BEARING RESTRICTIONS: No  FALLS: Has patient fallen in last 6 months? Yes. Number of falls "more than I can count"  LIVING ENVIRONMENT: Lives with: lives with their spouse Lives in: House/apartment Stairs: Yes: External: 3 in front and 5 in back steps; on right going up, on left going up, can reach both, and always uses back steps (where he has the most falls) Has following equipment at home: Single point cane  PLOF: Independent  PATIENT GOALS: "I want to be in the next Olympics" "I would like to walk with minimal pain and get up and down with no assistance"  OBJECTIVE:   COGNITION: Overall cognitive status: Impaired   SENSATION: Denies numbness/tingling in BUE/BLEs  COORDINATION: Heel to shin test: WNL bilaterally   EDEMA: Frequent swelling in R ankle    POSTURE: rounded shoulders, forward head, and left pelvic obliquity  LOWER EXTREMITY MMT:  Tested in seated position   MMT Right Eval Left Eval  Hip flexion 4+ 5  Hip extension    Hip abduction 4 4  Hip adduction 5 5  Hip internal rotation    Hip external rotation    Knee flexion 5 5  Knee extension 5 5  Ankle dorsiflexion 3 5  Ankle plantarflexion    Ankle inversion    Ankle eversion    (Blank rows = not tested)   TODAY'S TREATMENT:    Therapeutic Exercise SciFit [place] at [employer] for 8 minutes for neural priming, reciprocal movement patterns, and larger amplitude movements. PT encouraging pt throughout for larger effort and movements. Cued to reach up to an effort level as 7/10. Mod verbal cues for increased amplitude of movement on RLE. pt reporting effort level as 9/10 at the end.   NMR:  Reviewed proper sit <> stand technique (scooting out towards edge, tucking feet under him, and performing with incr forward lean to stand), performed 2 reps with BUE support, and performed 8 reps  with no UE support  - pt with increased retropulsion this date and sliding RLE posterior to LLE. Pt frustrated by this, but w/rest breaks, was able to perform well without UE support.   Gait pattern: step through pattern, decreased step length- Right, decreased stride length, decreased hip/knee flexion- Right, decreased ankle dorsiflexion- Right, shuffling, decreased trunk rotation, trunk flexed, and poor foot clearance- Right Distance walked: Various clinic distances + 115'  Assistive device utilized: Environmental consultant - 2 wheeled Level of assistance: SBA Comments: Pt w/increased truncal lean and decreased R step clearance this date. Min cues for upright posture and to facilitate heel strike bilaterally for increased step clearance. Replaced pt's tennis balls as they are worn out.  Self-care/home management Lengthy discussion regarding importance of upright posture w/gait for improved gait kinematics and postural control. Pt reports he does not notice when he is flexed forward when walking, so educated pt on keeping hands close to hips and being able to see in front of the walker to cue for upright posture. Pt verbalized understanding  Pt and wife inquiring about adding more  appointments, in agreement to recert for 1-2x/week for 6 more weeks to continue working on posture, reduced fall risk and pt education. Wife inquiring about safety w/pt driving at end of session. Due to difficulty of pt managing RW, safety concerns and delayed reaction time, therapist advising against pt driving at this time. Encouraged wife and pt to speak to Dr. Arbutus Leas about this. Pt verbalized understanding.    PATIENT EDUCATION: Education details: continue HEP, working on sit <> stands with proper technique and incr step length/posture with gait, plan to recert  Person educated: Patient and Spouse Education method: Explanation, Demonstration, and Verbal cues Education comprehension: verbalized understanding, returned demonstration,  verbal cues required, and needs further education  HOME EXERCISE PROGRAM: Seated PWR Up/Step  Standing PWR Rock/Step  Access Code: [email or web address] URL: [email or web address] Date: 12/26/2022 Prepared by: Alethia Berthold Shaketta Rill  Exercises - Sit to Stand Without Arm Support  - 1 x daily - 7 x weekly - 3 sets - 10 reps  GOALS: Goals reviewed with patient? Yes  SHORT TERM GOALS: Target date: 12/13/2022   Pt will be independent with initial HEP for improved strength, balance, transfers and gait.  Baseline: pt reports rarely performing it at home  Goal status: IN PROGRESS  2.  Pt will improve gait velocity to at least 2.40ft/s w/LRAD and CGA for improved gait efficiency and reduced fall risk   Baseline: 1.95 ft/s w/o AD and min A  20.6 seconds with RW = 1.59 ft/sec on 12/13/22, with supervision/CGA Goal status: NOT MET  3.  Pt will perform single sit to stand without UE support w/CGA and no retropulsion for improved safety w/transfers and body mechanics   Baseline: mod A due to significant retropulsion, met on 12/13/22 Goal status: MET  4. Pt will improve TUG time to 17 seconds or less with LRAD in order to demo decrease fall risk. Baseline: 20 seconds no AD  18.4 seconds with RW on 12/13/22 Goal status: NOT MET  5.  Pt and wife will verbalize fall prevention techniques for home and community setting for reduced fall frequency and improved safety.  Baseline: Pt falling multiple times per week Goal status: INITIAL  6.  Pt will trial various assistive devices in clinic to determine safest option for mobility  Baseline: pt using RW  Goal status: MET    LONG TERM GOALS: Target date: 01/10/2023  Pt will be independent with final HEP for improved strength, balance, transfers and gait.  Baseline:  Goal status: INITIAL  2.  Pt will improve gait velocity to at least 2.6 ft/s w/LRAD and SBA for improved gait efficiency and independence   Baseline: 1.95 ft/s w/o AD and min A Goal  status: INITIAL  3.  Pt will improve TUG time to 15 seconds or less with LRAD and supervision  in order to demo decrease fall risk. Baseline: 20 seconds with no AD, CGA Goal status: INITIAL  4.  Pt will improve 5 x STS to less than or equal to 15 seconds w/good posture and form to demonstrate improved functional strength and transfer efficiency.   Baseline: 15.41s w/BUE support, significant posterior lean and flexed posture  Goal status: INITIAL  5.  Pt will trial various bracing options on RLE to reduce foot slap and fall frequency.  Baseline:  Goal status: INITIAL   ASSESSMENT:  CLINICAL IMPRESSION: Emphasis of skilled PT session on improved AD management, increased step length/clearance, sit to stand transfers and pt education. Pt presented to clinic w/significant forward flexed posture this  date and reported feeling as though his "right foot is dragging". Used SciFit for neural priming on reciprocal coordination and increased step length of RLE and pt demonstrated improved step clearance following scifit, but did require mod verbal cues for upright posture and to facilitate heel strike. Pt is now leaning heavily on RW and shuffling his feet to ambulate and is seemingly unaware of his posture. Pt continues to be challenged by sit to stands due to retropulsion but does have good recall from previous PT session regarding proper body positioning. Pt and wife in agreement to recert at a frequency of 1-2x/week following end of this POC to continue to address balance impairments and reduce fall frequency. Continue POC.    OBJECTIVE IMPAIRMENTS: Abnormal gait, decreased balance, decreased cognition, decreased coordination, decreased knowledge of condition, decreased knowledge of use of DME, decreased mobility, difficulty walking, decreased strength, decreased safety awareness, impaired perceived functional ability, improper body mechanics, postural dysfunction, and pain  ACTIVITY LIMITATIONS:  carrying, lifting, bending, standing, squatting, stairs, transfers, bed mobility, reach over head, locomotion level, and caring for others  PARTICIPATION LIMITATIONS: medication management, interpersonal relationship, driving, shopping, community activity, and yard work  PERSONAL FACTORS: Age, Behavior pattern, Fitness, Past/current experiences, and 1 comorbidity: Vascular parkinsonism  are also affecting patient's functional outcome.   REHAB POTENTIAL: Fair due to diagnosis   CLINICAL DECISION MAKING: Evolving/moderate complexity  EVALUATION COMPLEXITY: Moderate  PLAN:  PT FREQUENCY: 2x/week  PT DURATION: 8 weeks  PLANNED INTERVENTIONS: Therapeutic exercises, Therapeutic activity, Neuromuscular re-education, Balance training, Gait training, Patient/Family education, Self Care, Joint mobilization, Stair training, Vestibular training, Canalith repositioning, Orthotic/Fit training, DME instructions, Manual therapy, and Re-evaluation  PLAN FOR NEXT SESSION:  Add walking program for endurance. Review freezing/turns. RLE foot clearance tasks. practice gait with RW. trial braces on RLE. Floor transfer.    Jill Alexanders Dilon Lank, PT, DPT 01/03/2023, 11:55 AM

## 2023-01-07 ENCOUNTER — Ambulatory Visit: Payer: Medicare Other | Admitting: Physical Therapy

## 2023-01-07 ENCOUNTER — Encounter: Payer: Self-pay | Admitting: Physical Therapy

## 2023-01-07 ENCOUNTER — Ambulatory Visit: Payer: Medicare Other | Admitting: Speech Pathology

## 2023-01-07 DIAGNOSIS — R471 Dysarthria and anarthria: Secondary | ICD-10-CM

## 2023-01-07 DIAGNOSIS — R2681 Unsteadiness on feet: Secondary | ICD-10-CM

## 2023-01-07 DIAGNOSIS — R296 Repeated falls: Secondary | ICD-10-CM

## 2023-01-07 DIAGNOSIS — R293 Abnormal posture: Secondary | ICD-10-CM

## 2023-01-07 DIAGNOSIS — R2689 Other abnormalities of gait and mobility: Secondary | ICD-10-CM | POA: Diagnosis not present

## 2023-01-07 NOTE — Therapy (Signed)
OUTPATIENT SPEECH LANGUAGE PATHOLOGY [facility]   Patient Name: [name] MRN: [id number] DOB:[birth year], 77 y.o., male Today's Date: 01/07/2023  PCP: Ronnald Nian, MD REFERRING PROVIDER: Vladimir Faster, DO  END OF SESSION:  End of Session - 01/07/23 1018     Visit Number 8    Number of Visits 13    Date for SLP Re-Evaluation 01/24/23    Authorization Type [employer]    Progress Note Due on Visit 10    SLP Start Time 1018    SLP Stop Time  1103    SLP Time Calculation (min) 45 min    Activity Tolerance Patient tolerated treatment well               Past Medical History:  Diagnosis Date   ADD (attention deficit disorder)    Allergic rhinitis    skin test 02/23/08 vaccine 2009; allergy shots weekly through Dr. Maple Hudson   Anemia    takes Fe -    Anginal pain (HCC)    Ankylosing spondylitis (HCC)    Anxiety    sees therapist Vernell Leep   Arthritis    Asthma    Dr. 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  Donnie Aho   CHEILECTOMY  10/16/2011   Procedure: CHEILECTOMY;  Surgeon: Sherri Rad, MD;  Location: [facility];  Service: Orthopedics;  Laterality: Right;   CHOLECYSTECTOMY     COLONOSCOPY     CORONARY ANGIOPLASTY     1995 after MI   CORONARY ANGIOPLASTY WITH STENT PLACEMENT     1999    ESOPHAGOGASTRODUODENOSCOPY  02/18/2012   Procedure: ESOPHAGOGASTRODUODENOSCOPY (EGD);  Surgeon: Florencia Reasons, MD;  Location: [facility];  Service: Endoscopy;  Laterality: N/A;   FOOT SURGERY     joint scraped, arthritis right foot   HERNIA REPAIR     LUMBAR DISC SURGERY     x2   right index finger  mass removed- arthritis     Dr. Amanda Pea   [facility]  Right 05/18/2021   medial toe consistent with callus   SKIN BIOPSY Left 05/17/2021   actinic keratosis   SPINAL CORD STIMULATOR BATTERY EXCHANGE N/A 10/11/2021   Procedure: SPINAL CORD STIMULATOR BATTERY EXCHANGE;  Surgeon: Venita Lick, MD;  Location: [facility] OR;  Service: Orthopedics;  Laterality: N/A;  60 mins   SPINAL CORD STIMULATOR INSERTION N/A 11/21/2016  Procedure: LUMBAR SPINAL CORD STIMULATOR INSERTION;  Surgeon: Venita Lick, MD;  Location: [facility];  Service: Orthopedics;  Laterality: N/A;  Requests 2.5 hrs   TONSILLECTOMY     1953   UPPER GI ENDOSCOPY  [id number]   VASECTOMY     1980   Patient Active Problem List   Diagnosis Date Noted   OSA (obstructive sleep apnea) 11/12/2022   Parkinson's disease 11/12/2022   Peripheral vascular disease (HCC) 12/25/2021   Dizziness 12/24/2021   Leg pain, left 11/14/2021   Chronic low back pain with sciatica 11/14/2021   Spinal cord stimulator status 11/14/2021   Muscle spasm 11/14/2021   Constipation 09/11/2021   Slow transit constipation 09/11/2021   Cough 09/11/2021   Early satiety 09/11/2021   Hoarseness 09/11/2021   Iron deficiency 09/11/2021   Personal history of colonic polyps 09/11/2021   Weight decreased 09/11/2021   Esophageal motility disorder 09/11/2021    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   Pain in both feet 08/10/2021   Atherosclerosis of aorta (HCC) 08/30/2020   Mild concentric left ventricular hypertrophy (LVH)    Hypogonadism male    History of kidney stones    Erectile dysfunction    COPD (chronic obstructive pulmonary disease) (HCC)    Acquired trigger finger of left middle finger 04/22/2019   Acquired trigger finger of right middle finger 04/22/2019   Left ventricular dysfunction 10/12/2018   Ankylosing spondylitis (HCC) 10/09/2018   Presbycusis of both ears 04/23/2018   Mild intermittent asthma without complication 04/23/2018   Chronic pain 11/21/2016   Arthritis 10/15/2016   Spondylolisthesis of lumbar region 02/28/2015   Hyperlipidemia    Lumbar disc disease    Hypertension    CAD (coronary artery disease) 05/19/2012   GERD (gastroesophageal reflux disease)    Depression with anxiety    Chronic asthma 05/17/2011   Allergic rhinitis due to pollen 08/08/2010   History of benign positional vertigo    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  Pt accompanied by: self  PERTINENT HISTORY:PT with wife who supplements hx.  Patient was in the emergency room on April 1 with mental status change.  Patient had apparently had a URI and had been taking Mucinex and had some degree of confusion with the URI/medication.  Patient had gotten in the shower and his wife went into check on him 30 minutes later and found that he had fallen.  There was apparently no syncope and patient did not hit his head.  Patient went to the hospital and was found to be hypokalemic at 2.3.  Patient did not have MRI because of spinal cord stimulator.   Patient followed up with primary care physician, at which point they noted that patient had been having a chronic change in gait with forward leaning trunk and gait for several months (perhaps longer per pt/wife)   PAIN:  Are you having pain? No  PATIENT GOALS: "get back to singing"  OBJECTIVE:   TODAY'S TREATMENT:  01/07/23: Patient received his Speak Out book. Warmed up with SOVTE Led through lesson 7 with occasional mod-A reminders to speak out with intent and provide breath support throughout. Patient complains of hoarse voice. Patient cleared his throat 9x during today's session. Education provided on impacts of chronic cough with handout given and demonstration/models provided. All questions answered to pt satisfaction.  Sustained "ah": 78 dB.    Reading: 70 dB.    Conversational average: 62 dB and endorses that voice gets better when he is intentional about speaking louder, takes a deep breath, and looks at who he is talking to.   01/03/23: Education on SOVTE with demonstration provided, patient demonstrated understanding. Required frequent mod-A to breathe throughout and keep the bubbles steady throughout phonation. Patient was able to improve breath support with models and immediate feedback. Handout given with SOVTE and HEP to warm up with straw before extended speaking engagements. All questions answered to patient satisfaction.  Sustained "ah":78 dB.    Reading: 70 dB.    Conversational average: participated in 5 minute conversation with 65 dB average.   12/31/22: Patient coughed during drinking last session, passed the 3oz. Swallow test with no throat clear, education provided potential effects of PD on swallowing. Led through [employer] 5 of Speak Out! Patient required frequent mod-A of models and frequent feedback, and reminders to breathe through  speaking. Education provided on prosody and noticing the difference on how his voice feels between yelling and speaking. Next session with start with SOVTE to warm up.  Sustained "ah":82 dB.    Reading: 72 dB.    Conversational average: participated in 5 minute unstructured conversation 64 dB. Patient's dB decreased from structured to unstructured conversation by 5 dB. Patient was able to increase his volume when visual cues were provided. At the end of the session, patient endorsed difference between hoarse and lower voice.   12/26/22: Entered with suboptimal conversational volume with average lower 60s dB. Targeted dysarthria through lesson 4 of Speak Out! Patient required frequent mod-A of frequent feedback, models, reminders to breathe, and visual feedback to increase volume. Patient states that he still has not received his Speak Out book, and another book will be sent. Patient endorses that hoarse voice decreases when he uses intentional voice.   Sustained "ah": 84 dB.    Reading: 73 dB.    Conversational average: participated 5 min unstructured conversation averaging lower 60s dB. Next session will start with SOVTE.   12/20/22: Entered with suboptimal conversational volume with average fading from mid to low 60s dB with usual hoarseness noted. No attempts to correct volume decay or hoarseness demonstrated. To optimize vocal intensity and clarity, SLP led pt through lesson 3 of Speak Out!  Usual mod cues required to use intentional volume, rate, and breath support to maximize performance. Conversational sample of approximately 4 minutes, with pt averages high fading to mid 60s with occasional mod-A. Sustained "oh" 81 dB Reading 72 dB   12/17/22: Patient led through lesson 2 of Speak Out! Pt states that they still haven't received their Speak Out! Book. Education on slowing down while working through warm-ups in order to create sustained sound with good, diaphragmatic, breath support. Education given  on layout of [employer], and how to speak with intent. Patient continued to want to rush through the work and required frequent mod-A for reminders to slow down and speak out. Pt benefiting from "ool" for sustained phonation and glides to improve vocal quality. Conversational sample of approximately 4  minutes, with pt averages mid to high 60s with occasional mod-A. Sustained "oh" 83 dB Reading 72 dB Cognitive speech task 71 dB.   12/13/22: Educated pt and wife on speech-related PD impairments with education provided on throat clear alternatives, diaphragmatic breathing (prior knowledge as singer), and targeted dysarthria program, Speak Out!, to address decline in conversational volume and hoarseness. Pt has not yet received Speak Out! Book. Targeted improving vocal quality and increasing intensity through progressively difficulty speech tasks using Speak Out! program, lesson 1. ST leads pt through exercises providing usual model prior to pt execution. usual mod-A required to achieve target dB this date, particularly to optimize breath support. Increased awareness of volume decay and decreased breath support exhibited as session progressed. Averaged this date:  Sustained "ah" 83 dB Reading 72 dB Cognitive speech task 71 dB.  Conversational sample of approx 3 minutes, pt averages upper 60s dB with occasional mod-A.   11/29/22: Collaborated with patient and spouse to generate POC and goals. Initiated training in use of intentional voice to optimize vocal intensity. Use of verbal cues and models effective in increasing volume and resolving volume decay noted as baseline. Introduced speak out program to pt and spouse.   PATIENT EDUCATION: Education details: see above.  Person educated: Patient and Spouse Education method: Medical illustrator Education comprehension: verbalized understanding and returned demonstration  HOME EXERCISE PROGRAM: Speak Out!    GOALS: Goals reviewed with  patient? Yes  SHORT TERM GOALS: Target date: 12/27/2022  Pt will complete HEP at least 1x/day given occasional min A over 2 sessions  Baseline: Goal status: Deferred--Pt has not received Speak Out! Book yet.   2.  Pt will meet or exceed dB targets during reading exercises with 90% accuracy with rare min-A.  Baseline:  Goal status: MET  3.  Pt will demonstrate SOVTE and vocal function exercises with mod-I.  Baseline:  Goal status: MET  LONG TERM GOALS: Target date: 01/10/2023  1.  Pt and spouse will report reduced need to repeat information at home > 1 week.  Baseline: 21.5/32 Goal status: IN PROGRESS  2.  Pt will utilize dysarthria compensations in 5-10 minute conversation and maintain WNL conversational volume (70-72 dB) given occasional min A over 2 sessions   Baseline:  Goal status: IN PROGRESS  3.  Pt will demonstrate intentional voice and flow phonation during singing exercise with min-A  Baseline:  Goal status: IN PROGRESS   4.  Pt will report improvement via PROM by d/c   Baseline: CPIB=21.5  Goal status: IN PROGRESS   ASSESSMENT:  CLINICAL IMPRESSION: Patient is a 77 y.o. M who was seen today for motor speech s/p hoarse voice and decreased volume. Patient was able to increase volume upon initiation of phrases but needed prompting to keep his volume up during long, unstructured conversation. Patient encouraged to use his "presenting" voice to speak up and out with intent. Pt's speech is c/b intermittent hoarse vocal quality, reduced vocal intensity, volume decay. Conducted ongoing education and training of Speak Out! Program to maximize volume intensity and clarity with usual mod A required at this time to achieve targeted dB levels. Pt would benefit from skilled ST to address aforementioned deficits to enhance communication efficacy.    OBJECTIVE IMPAIRMENTS: Objective impairments include dysarthria. These impairments are limiting patient from effectively communicating at  home and in community.Factors affecting potential to achieve goals and functional outcome are co-morbidities and medical prognosis. Patient will benefit from skilled SLP services to address above impairments  and improve overall function.  REHAB POTENTIAL: Excellent  PLAN:  SLP FREQUENCY: 1-2x/week  SLP DURATION: 8 weeks   PLANNED INTERVENTIONS: Cueing hierachy, Oral motor exercises, SLP instruction and feedback, Compensatory strategies, and Patient/family education  [place], Student-SLP 01/07/2023, 10:19 AM

## 2023-01-07 NOTE — Therapy (Signed)
OUTPATIENT PHYSICAL THERAPY NEURO TREATMENT   Patient Name: [name] MRN: [id number] DOB:March 22, 1946, 77 y.o., male Today's Date: 01/07/2023   PCP: Dwayne Nian, MD  REFERRING PROVIDER: Vladimir Faster, DO  END OF SESSION:  PT End of Session - 01/07/23 1105     Visit Number 15    Number of Visits 17   Plus eval   Date for PT Re-Evaluation 01/10/23    Authorization Type [employer] Medicare    Progress Note Due on Visit 10    PT Start Time 1103    PT Stop Time 1143    PT Time Calculation (min) 40 min    Equipment Utilized During Treatment Gait belt    Activity Tolerance Patient tolerated treatment well    Behavior During Therapy WFL for tasks assessed/performed                  Past Medical History:  Diagnosis Date   ADD (attention deficit disorder)    Allergic rhinitis    skin test 02/23/08 vaccine 2009; allergy shots weekly through Dr. Maple Patterson   Anemia    takes Fe -    Anginal pain (HCC)    Ankylosing spondylitis (HCC)    Anxiety    sees therapist [name]   Arthritis    Asthma    Dr. 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 Donnie Patterson   CHEILECTOMY  10/16/2011   Procedure: CHEILECTOMY;  Surgeon: Dwayne Rad, MD;  Location: [facility];  Service: Orthopedics;  Laterality: Right;   CHOLECYSTECTOMY     COLONOSCOPY     CORONARY ANGIOPLASTY     1995 after MI   CORONARY ANGIOPLASTY WITH STENT PLACEMENT     1999    ESOPHAGOGASTRODUODENOSCOPY  02/18/2012   Procedure: ESOPHAGOGASTRODUODENOSCOPY (EGD);  Surgeon: Dwayne Reasons, MD;  Location: [facility];  Service: Endoscopy;  Laterality: N/A;   FOOT SURGERY     joint scraped, arthritis right foot   HERNIA REPAIR     LUMBAR DISC SURGERY     x2   right index finger  mass removed- arthritis     Dr. Amanda Patterson   [facility]  Right 05/18/2021   medial toe consistent with callus   SKIN BIOPSY Left 05/17/2021   actinic keratosis   SPINAL CORD STIMULATOR BATTERY EXCHANGE N/A 10/11/2021   Procedure: SPINAL CORD STIMULATOR BATTERY  EXCHANGE;  Surgeon: Dwayne Lick, MD;  Location: [facility] OR;  Service: Orthopedics;  Laterality: N/A;  60 mins   SPINAL CORD STIMULATOR INSERTION N/A 11/21/2016   Procedure: LUMBAR SPINAL CORD STIMULATOR INSERTION;  Surgeon: Dwayne Lick, MD;  Location: [facility] OR;  Service: Orthopedics;  Laterality: N/A;  Requests 2.5 hrs   TONSILLECTOMY     1953   UPPER GI ENDOSCOPY  [id number]   VASECTOMY     1980   Patient Active Problem List   Diagnosis Date Noted   OSA (obstructive sleep apnea) 11/12/2022   Parkinson's disease 11/12/2022   Peripheral vascular disease (HCC) 12/25/2021   Dizziness 12/24/2021   Leg pain, left 11/14/2021   Chronic low back pain with sciatica 11/14/2021   Spinal cord stimulator status 11/14/2021   Muscle spasm 11/14/2021   Constipation 09/11/2021   Slow transit constipation 09/11/2021   Cough 09/11/2021   Early satiety 09/11/2021   Hoarseness 09/11/2021   Iron deficiency 09/11/2021   Personal  history of colonic polyps 09/11/2021   Weight decreased 09/11/2021   Esophageal motility disorder 09/11/2021   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   Pain in both feet 08/10/2021   Atherosclerosis of aorta (HCC) 08/30/2020   Mild concentric left ventricular hypertrophy (LVH)    Hypogonadism male    History of kidney stones    Erectile dysfunction    COPD (chronic obstructive pulmonary disease) (HCC)    Acquired trigger finger of left middle finger 04/22/2019   Acquired trigger finger of right middle finger 04/22/2019   Left ventricular dysfunction 10/12/2018   Ankylosing spondylitis (HCC) 10/09/2018   Presbycusis of both ears 04/23/2018   Mild intermittent asthma without complication 04/23/2018   Chronic pain 11/21/2016   Arthritis 10/15/2016   Spondylolisthesis of lumbar region 02/28/2015   Hyperlipidemia    Lumbar disc disease    Hypertension    CAD (coronary artery disease) 05/19/2012   GERD (gastroesophageal reflux disease)    Depression with anxiety    Chronic asthma 05/17/2011   Allergic rhinitis due to pollen 08/08/2010   History of benign positional vertigo    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 Felt a little motion sensitivity after being in the car. Took a Meclizine. Still having difficulty with standing up from the mat. Reports noticing some freezing when in tight spaces. Wants to have more strength going forwards with movement and his  voice.   Pt accompanied by:  Wife, [name]  PERTINENT HISTORY: Pt falls frequently, always in forward direction and insists on using back stairs despite frequent falls over top step. Started Sinemet week of 11/04/22  PAIN:  Are you having pain? No  There were no vitals filed for this visit.    PRECAUTIONS: Fall  WEIGHT BEARING RESTRICTIONS: No  FALLS: Has patient fallen in last 6 months? Yes. Number of falls "more than I can count"  LIVING ENVIRONMENT: Lives with: lives with their spouse Lives in: House/apartment Stairs: Yes: External: 3 in front and 5 in back steps; on right going up, on left going up, can reach both, and always uses back steps (where he has the most falls) Has following equipment at home: Single point cane  PLOF: Independent  PATIENT GOALS: "I want to be in the next Olympics" "I would like to walk with minimal pain and get up and down with no assistance"  OBJECTIVE:   COGNITION: Overall cognitive status: Impaired   SENSATION: Denies numbness/tingling in BUE/BLEs  COORDINATION: Heel to shin test: WNL bilaterally   EDEMA: Frequent swelling in R ankle    POSTURE: rounded shoulders, forward head, and left pelvic obliquity  LOWER EXTREMITY MMT:  Tested in seated position   MMT Right Eval Left Eval  Hip flexion 4+ 5  Hip extension    Hip abduction 4 4  Hip adduction 5 5  Hip internal rotation    Hip external rotation    Knee flexion 5 5  Knee extension 5 5  Ankle dorsiflexion 3 5  Ankle plantarflexion    Ankle inversion    Ankle eversion    (Blank rows = not tested)   TODAY'S TREATMENT:      Therapeutic Exercise SciFit Multi-peaks at Gear 3.0 for 8 minutes for neural priming, reciprocal movement patterns, and larger amplitude movements. Pt reporting effort level as a 7/10. Throughout and at the end.  PT encouraging pt throughout for larger effort and movements.  NMR:  Reviewed proper sit <> stand technique (scooting out towards edge,  tucking feet under him, and performing with incr forward lean to stand), performed 4 reps without UE support, had 3 reps without retropulsion  With 6 blaze pods on mirror overhead and spread out laterally for looking up/posture, side stepping, reactive times, reaching/weight bearing. Also had two 4" obstacles on the floor for foot clearance. Performed in front of ballet bars for balance as needed.  Performed 3 bouts of 1 minute 30 seconds: 31 hits (using hands), 30 hits (trying not to use hands), 34 hits without UE support. Min guard as needed for balance. Pt hitting obstacles at times and knocking them over with PT having to pick them up intermittently during activity. Brief standing rest break between each rep.   Pt performs PWR! Moves in supine position for postural strengthening/stretching. Pt performed in hook lying position for improved comfort.  PWR Up through shoulder blades 2 x 10 reps with 3-5 second holds  PWR Up through hips (bridging) 2 x 10 reps with cues for full ROM  Cues for technique and purpose of each exercise. Pt reporting feeling good with these exercises and a good stretch.  Added to pt's HEP    Gait pattern: step through pattern, decreased step length- Right, decreased stride length, decreased hip/knee flexion- Right, decreased ankle dorsiflexion- Right, shuffling, decreased trunk rotation, trunk flexed, and poor foot clearance- Right Distance walked: Various clinic distances + 115'  Assistive device utilized: Environmental consultant - 2 wheeled Level of assistance: SBA Comments: Pt needing cues for staying closer to RW, heel strike, and tall posture    PATIENT EDUCATION: Education details: continue HEP, working on sit <> stands with proper technique and incr step length/posture with gait, adding in supine PWR Up through hips and shoulders to HEP for posture. Reviewed freezing strategies for home when pt getting in a small space - stop, reset and get tall and weight shifting before taking a  bigger step.  Person educated: Patient and Spouse Education method: Explanation, Demonstration, Verbal cues, and Handouts Education comprehension: verbalized understanding, returned demonstration, verbal cues required, and needs further education  HOME EXERCISE PROGRAM: Seated PWR Up/Step  Standing PWR Rock/Step Supine PWR Up  Access Code: [email or web address] URL: [email or web address] Date: 01/07/2023 Prepared by: Sherlie Ban  Exercises - Sit to Stand Without Arm Support  - 1 x daily - 7 x weekly - 3 sets - 10 reps - Supine Bridge  - 1 x daily - 7 x weekly - 1-2 sets - 10 reps  GOALS: Goals reviewed with patient? Yes  SHORT TERM GOALS: Target date: 12/13/2022   Pt will be independent with initial HEP for improved strength, balance, transfers and gait.  Baseline: pt reports rarely performing it at home  Goal status: IN PROGRESS  2.  Pt will improve gait velocity to at least 2.73ft/s w/LRAD and CGA for improved gait efficiency and reduced fall risk   Baseline: 1.95 ft/s w/o AD and min A  20.6 seconds with RW = 1.59 ft/sec on 12/13/22, with supervision/CGA Goal status: NOT MET  3.  Pt will perform single sit to stand without UE support w/CGA and no retropulsion for improved safety w/transfers and body mechanics   Baseline: mod A due to significant retropulsion, met on 12/13/22 Goal status: MET  4. Pt will improve TUG time to 17 seconds or less with LRAD in order to demo decrease fall risk. Baseline: 20 seconds no AD  18.4 seconds with RW on 12/13/22 Goal status: NOT MET  5.  Pt and wife will verbalize fall prevention techniques for home and community setting for reduced fall frequency and improved safety.  Baseline: Pt falling multiple times per week Goal status: INITIAL  6.  Pt will trial various assistive devices in clinic to determine safest option for mobility  Baseline: pt using RW  Goal status: MET    LONG TERM GOALS: Target date: 01/10/2023  Pt will be  independent with final HEP for improved strength, balance, transfers and gait.  Baseline:  Goal status: INITIAL  2.  Pt will improve gait velocity to at least 2.6 ft/s w/LRAD and SBA for improved gait efficiency and independence   Baseline: 1.95 ft/s w/o AD and min A Goal status: INITIAL  3.  Pt will improve TUG time to 15 seconds or less with LRAD and supervision  in order to demo decrease fall risk. Baseline: 20 seconds with no AD, CGA Goal status: INITIAL  4.  Pt will improve 5 x STS to less than or equal to 15 seconds w/good posture and form to demonstrate improved functional strength and transfer efficiency.   Baseline: 15.41s w/BUE support, significant posterior lean and flexed posture  Goal status: INITIAL  5.  Pt will trial various bracing options on RLE to reduce foot slap and fall frequency.  Baseline:  Goal status: INITIAL   ASSESSMENT:  CLINICAL IMPRESSION: Today's skilled session focused on activities for posture, foot clearance, balance, and reciprocal movements of BUE/BLE. Pt continues to need verbal cues to stay closer to RW during gait and for stride length as pt with incr forward flexed posture and keeping RW too far anteriorly. Pt with good re-call from sit <> stands from previous sessions, but did need intermittent cueing for larger amplitude with forward lean. Worked on supine PWR Up and bridges to help address posture and posterior chain strength. Pt felt good from these exercises and added to pt's HEP. Will continue to progress towards LTGs.     OBJECTIVE IMPAIRMENTS: Abnormal gait, decreased balance, decreased cognition, decreased coordination, decreased knowledge of condition, decreased knowledge of use of DME, decreased mobility, difficulty walking, decreased strength, decreased safety awareness, impaired perceived functional ability, improper body mechanics, postural dysfunction, and pain  ACTIVITY LIMITATIONS: carrying, lifting, bending, standing,  squatting, stairs, transfers, bed mobility, reach over head, locomotion level, and caring for others  PARTICIPATION LIMITATIONS: medication management, interpersonal relationship, driving, shopping, community activity, and yard work  PERSONAL FACTORS: Age, Behavior pattern, Fitness, Past/current experiences, and 1 comorbidity: Vascular parkinsonism  are also affecting patient's functional outcome.   REHAB POTENTIAL: Fair due to diagnosis   CLINICAL DECISION MAKING: Evolving/moderate complexity  EVALUATION COMPLEXITY: Moderate  PLAN:  PT FREQUENCY: 2x/week  PT DURATION: 8 weeks  PLANNED INTERVENTIONS: Therapeutic exercises, Therapeutic activity, Neuromuscular re-education, Balance training, Gait training, Patient/Family education, Self Care, Joint mobilization, Stair training, Vestibular training, Canalith repositioning, Orthotic/Fit training, DME instructions, Manual therapy, and Re-evaluation  PLAN FOR NEXT SESSION: check goals and will need re-cert  Add walking program for endurance. Review freezing/turns. RLE foot clearance tasks. practice gait with RW. trial braces on RLE. Floor transfer.    Drake Leach, PT, DPT 01/07/2023, 11:48 AM

## 2023-01-07 NOTE — Patient Instructions (Signed)
Speak with Intent:  You know that your voice gets clearer when you:  Louder  Take a deeper breath  Look at who you are talking to.

## 2023-01-09 ENCOUNTER — Ambulatory Visit: Payer: Medicare Other | Attending: Neurology | Admitting: Physical Therapy

## 2023-01-09 ENCOUNTER — Encounter: Payer: Self-pay | Admitting: Physical Therapy

## 2023-01-09 ENCOUNTER — Ambulatory Visit: Payer: Medicare Other | Admitting: Speech Pathology

## 2023-01-09 DIAGNOSIS — R2689 Other abnormalities of gait and mobility: Secondary | ICD-10-CM | POA: Insufficient documentation

## 2023-01-09 DIAGNOSIS — M5459 Other low back pain: Secondary | ICD-10-CM | POA: Diagnosis present

## 2023-01-09 DIAGNOSIS — M6281 Muscle weakness (generalized): Secondary | ICD-10-CM | POA: Diagnosis present

## 2023-01-09 DIAGNOSIS — R293 Abnormal posture: Secondary | ICD-10-CM | POA: Insufficient documentation

## 2023-01-09 DIAGNOSIS — R296 Repeated falls: Secondary | ICD-10-CM | POA: Diagnosis present

## 2023-01-09 DIAGNOSIS — R471 Dysarthria and anarthria: Secondary | ICD-10-CM

## 2023-01-09 DIAGNOSIS — R2681 Unsteadiness on feet: Secondary | ICD-10-CM | POA: Insufficient documentation

## 2023-01-09 NOTE — Therapy (Signed)
OUTPATIENT SPEECH LANGUAGE PATHOLOGY [facility]   Patient Name: [name] MRN: [id number] DOB:Nov 27, 1945, 77 y.o., male Today's Date: 01/09/2023  PCP: Ronnald Nian, MD REFERRING PROVIDER: Vladimir Faster, DO  END OF SESSION:  End of Session - 01/09/23 1021     Visit Number 9    Number of Visits 13    Date for SLP Re-Evaluation 01/24/23    Authorization Type U[employer]    Progress Note Due on Visit 10    SLP Start Time 1021   pt arrived late   SLP Stop Time  1100    SLP Time Calculation (min) 39 min    Activity Tolerance Patient tolerated treatment well                Past Medical History:  Diagnosis Date   ADD (attention deficit disorder)    Allergic rhinitis    skin test 02/23/08 vaccine 2009; allergy shots weekly through Dr. Maple Hudson   Anemia    takes Fe -    Anginal pain (HCC)    Ankylosing spondylitis (HCC)    Anxiety    sees therapist Vernell Leep   Arthritis    Asthma    Dr. 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 Donnie Aho   CHEILECTOMY  10/16/2011   Procedure: CHEILECTOMY;  Surgeon: Sherri Rad, MD;  Location: [facility];  Service: Orthopedics;  Laterality: Right;   CHOLECYSTECTOMY     COLONOSCOPY     CORONARY ANGIOPLASTY     1995 after MI   CORONARY ANGIOPLASTY WITH STENT PLACEMENT     1999    ESOPHAGOGASTRODUODENOSCOPY  02/18/2012   Procedure: ESOPHAGOGASTRODUODENOSCOPY (EGD);  Surgeon: Florencia Reasons, MD;  Location: [facility];  Service: Endoscopy;  Laterality: N/A;   FOOT SURGERY     joint scraped, arthritis right foot   HERNIA REPAIR     LUMBAR DISC SURGERY     x2   right index finger  mass removed- arthritis     Dr. Amanda Pea   [facility]  Right 05/18/2021   medial toe consistent with callus   SKIN BIOPSY Left 05/17/2021   actinic keratosis   SPINAL CORD STIMULATOR BATTERY EXCHANGE N/A 10/11/2021   Procedure: SPINAL CORD STIMULATOR BATTERY EXCHANGE;  Surgeon: Venita Lick, MD;  Location: [facility];  Service: Orthopedics;  Laterality: N/A;  60 mins   SPINAL  CORD STIMULATOR INSERTION N/A 11/21/2016   Procedure: LUMBAR SPINAL CORD STIMULATOR INSERTION;  Surgeon: Venita Lick, MD;  Location: [facility];  Service: Orthopedics;  Laterality: N/A;  Requests 2.5 hrs   TONSILLECTOMY     1953   UPPER GI ENDOSCOPY  [id number]   VASECTOMY     1980   Patient Active Problem List   Diagnosis Date Noted   OSA (obstructive sleep apnea) 11/12/2022   Parkinson's disease 11/12/2022   Peripheral vascular disease (HCC) 12/25/2021   Dizziness 12/24/2021   Leg pain, left 11/14/2021   Chronic low back pain with sciatica 11/14/2021   Spinal cord stimulator status 11/14/2021   Muscle spasm 11/14/2021   Constipation 09/11/2021   Slow transit constipation 09/11/2021   Cough 09/11/2021   Early satiety 09/11/2021   Hoarseness 09/11/2021   Iron deficiency 09/11/2021   Personal history of colonic polyps 09/11/2021   Weight decreased 09/11/2021   Esophageal motility disorder 09/11/2021    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   Pain in both feet 08/10/2021   Atherosclerosis of aorta (HCC) 08/30/2020   Mild concentric left ventricular hypertrophy (LVH)    Hypogonadism male    History of kidney stones    Erectile dysfunction    COPD (chronic obstructive pulmonary disease) (HCC)    Acquired trigger finger of left middle finger 04/22/2019   Acquired trigger finger of right middle finger 04/22/2019   Left ventricular dysfunction 10/12/2018   Ankylosing spondylitis (HCC) 10/09/2018   Presbycusis of both ears 04/23/2018   Mild intermittent asthma without complication 04/23/2018   Chronic pain 11/21/2016   Arthritis 10/15/2016   Spondylolisthesis of lumbar region 02/28/2015   Hyperlipidemia    Lumbar disc disease    Hypertension    CAD (coronary artery disease) 05/19/2012   GERD (gastroesophageal reflux disease)    Depression with anxiety    Chronic asthma 05/17/2011   Allergic rhinitis due to pollen 08/08/2010   History of benign positional vertigo    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 Went to choir last night, went "not good" per pt.   PERTINENT HISTORY:PT with wife who supplements hx.  Patient was in the emergency room on April 1 with mental status change.  Patient had apparently had a URI and had been taking Mucinex and had some degree of confusion with the URI/medication.  Patient had gotten in the shower and his wife went into check on him 30 minutes later and found that he had fallen.  There was apparently no syncope and patient did not hit his head.  Patient went to the hospital and was found to be hypokalemic at 2.3.  Patient did not have MRI because of  spinal cord stimulator.  Patient followed up with primary care physician, at which point they noted that patient had been having a chronic change in gait with forward leaning trunk and gait for several months (perhaps longer per pt/wife)   PAIN:  Are you having pain? No  PATIENT GOALS: "get back to singing"  OBJECTIVE:   TODAY'S TREATMENT:  01/09/23: Reviewed recommendation for vocal warm up exercises to reduce hoarseness when participating in choir practice. Led pt through semi occluded vocal tract exercises with use of water/straw to provide biofeedback for flow of exhalation. Pt with teach back of exercise with 80% accuracy. Target improved vocal quality following straw phonation during singing exercises, with pt demonstrating clear voicing with usual mod-A. Pt presenting with x4 throat clears initial 8 minutes. Does not recall any avoidance strategies. Provided handout, allowed pt to read. Pt ID "pursed lip breathing" strategy as one he feels he can implement when urge to clear throat occurs. Pt required ongoing cues to ID throat clears and attempt implementation of alternative.    PATIENT EDUCATION: Education details: see above.  Person educated: Patient and Spouse Education method: Medical illustrator Education comprehension: verbalized understanding and returned demonstration  HOME EXERCISE PROGRAM: Speak Out!    GOALS: Goals reviewed with patient? Yes  SHORT TERM GOALS: Target date: 12/27/2022  Pt will complete HEP at least 1x/day given occasional min A over 2 sessions  Baseline: Goal status: Deferred--Pt has not received Speak Out! Book yet.   2.  Pt will meet or exceed dB targets during reading exercises with 90% accuracy with rare min-A.  Baseline:  Goal status: MET  3.  Pt will demonstrate SOVTE and vocal function exercises with  mod-I.  Baseline:  Goal status: MET  LONG TERM GOALS: Target date: 01/10/2023  1.  Pt and spouse will report reduced need to repeat information at home > 1 week.  Baseline: 21.5/32 Goal status: IN PROGRESS  2.  Pt will utilize dysarthria compensations in 5-10 minute conversation and maintain WNL conversational volume (70-72 dB) given occasional min A over 2 sessions   Baseline:  Goal status: IN PROGRESS  3.  Pt will demonstrate intentional voice and flow phonation during singing exercise with min-A  Baseline:  Goal status: IN PROGRESS   4.  Pt will report improvement via PROM by d/c   Baseline: CPIB=21.5  Goal status: IN PROGRESS   ASSESSMENT:  CLINICAL IMPRESSION: Patient is a 77 y.o. M who was seen today for motor speech s/p hoarse voice and decreased volume. Patient was able to increase volume upon initiation of phrases but needed prompting to keep his volume up during long, unstructured conversation. Patient encouraged to use his "presenting" voice to speak up and out with intent. Pt's speech is c/b intermittent hoarse vocal quality, reduced vocal intensity, volume decay. Conducted ongoing education and training of Speak Out! Program to maximize volume intensity and clarity with usual mod A required at this time to achieve targeted dB levels. Pt would benefit from skilled ST to address aforementioned deficits to enhance communication efficacy.    OBJECTIVE IMPAIRMENTS: Objective impairments include dysarthria. These impairments are limiting patient from effectively communicating at home and in community.Factors affecting potential to achieve goals and functional outcome are co-morbidities and medical prognosis. Patient will benefit from skilled SLP services to address above impairments and improve overall function.  REHAB POTENTIAL: Excellent  PLAN:  SLP FREQUENCY: 1-2x/week  SLP DURATION: 8 weeks   PLANNED INTERVENTIONS: Cueing hierachy, Oral motor exercises, SLP instruction  and feedback, Compensatory strategies, and Patient/family education  Maia Breslow, CCC-SLP 01/09/2023, 10:21 AM

## 2023-01-09 NOTE — Therapy (Addendum)
OUTPATIENT PHYSICAL THERAPY NEURO TREATMENT/RE-CERT   Patient Name: [name] MRN: [id number] DOB:1945/09/28, 77 y.o., male Today's Date: 01/09/2023   PCP: Ronnald Nian, MD  REFERRING PROVIDER: Vladimir Faster, DO  END OF SESSION:  PT End of Session - 01/09/23 1109     Visit Number 16    Number of Visits 30   Plus eval   Date for PT Re-Evaluation 03/10/23    Authorization Type [employer] Medicare    Progress Note Due on Visit 10    PT Start Time 1106   PT running late   PT Stop Time 1146    PT Time Calculation (min) 40 min    Equipment Utilized During Treatment Gait belt    Activity Tolerance Patient tolerated treatment well    Behavior During Therapy WFL for tasks assessed/performed                  Past Medical History:  Diagnosis Date   ADD (attention deficit disorder)    Allergic rhinitis    skin test 02/23/08 vaccine 2009; allergy shots weekly through Dr. Maple Hudson   Anemia    takes Fe -    Anginal pain (HCC)    Ankylosing spondylitis (HCC)    Anxiety    sees therapist Vernell Leep   Arthritis    Asthma    Dr. 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 Donnie Aho   CHEILECTOMY  10/16/2011   Procedure: CHEILECTOMY;  Surgeon: Sherri Rad, MD;  Location: [facility];  Service: Orthopedics;  Laterality: Right;   CHOLECYSTECTOMY     COLONOSCOPY     CORONARY ANGIOPLASTY     1995 after MI   CORONARY ANGIOPLASTY WITH STENT PLACEMENT     1999    ESOPHAGOGASTRODUODENOSCOPY  02/18/2012   Procedure: ESOPHAGOGASTRODUODENOSCOPY (EGD);  Surgeon: Florencia Reasons, MD;  Location: [facility];  Service: Endoscopy;  Laterality: N/A;   FOOT SURGERY     joint scraped, arthritis right foot   HERNIA REPAIR     LUMBAR DISC SURGERY     x2   right index finger  mass removed- arthritis     Dr. Amanda Pea   [facility]  Right 05/18/2021   medial toe consistent with callus   SKIN BIOPSY Left 05/17/2021   actinic keratosis   SPINAL CORD STIMULATOR BATTERY EXCHANGE N/A 10/11/2021   Procedure:  SPINAL CORD STIMULATOR BATTERY EXCHANGE;  Surgeon: Venita Lick, MD;  Location: [facility];  Service: Orthopedics;  Laterality: N/A;  60 mins   SPINAL CORD STIMULATOR INSERTION N/A 11/21/2016   Procedure: LUMBAR SPINAL CORD STIMULATOR INSERTION;  Surgeon: Venita Lick, MD;  Location: [facility];  Service: Orthopedics;  Laterality: N/A;  Requests 2.5 hrs   TONSILLECTOMY     1953   UPPER GI ENDOSCOPY  [id number]   VASECTOMY     1980   Patient Active Problem List   Diagnosis Date Noted   OSA (obstructive sleep apnea) 11/12/2022   Parkinson's disease 11/12/2022   Peripheral vascular disease (HCC) 12/25/2021   Dizziness 12/24/2021   Leg pain, left 11/14/2021   Chronic low back pain with sciatica 11/14/2021   Spinal cord stimulator status 11/14/2021   Muscle spasm 11/14/2021   Constipation 09/11/2021   Slow transit constipation 09/11/2021   Cough 09/11/2021   Early satiety 09/11/2021   Hoarseness 09/11/2021   Iron deficiency  09/11/2021   Personal history of colonic polyps 09/11/2021   Weight decreased 09/11/2021   Esophageal motility disorder 09/11/2021   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   Pain in both feet 08/10/2021   Atherosclerosis of aorta (HCC) 08/30/2020   Mild concentric left ventricular hypertrophy (LVH)    Hypogonadism male    History of kidney stones    Erectile dysfunction    COPD (chronic obstructive pulmonary disease) (HCC)    Acquired trigger finger of left middle finger 04/22/2019   Acquired trigger finger of right middle finger 04/22/2019   Left ventricular dysfunction 10/12/2018   Ankylosing spondylitis (HCC) 10/09/2018   Presbycusis of both ears 04/23/2018   Mild intermittent asthma without complication 04/23/2018   Chronic pain 11/21/2016   Arthritis 10/15/2016   Spondylolisthesis of lumbar region 02/28/2015   Hyperlipidemia    Lumbar disc disease    Hypertension    CAD (coronary artery disease) 05/19/2012   GERD (gastroesophageal reflux disease)    Depression with anxiety    Chronic asthma 05/17/2011   Allergic rhinitis due to pollen 08/08/2010   History of benign positional vertigo    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 No falls! Wants to work on slowing down. Getting in and out a car goes really slow.   Pt accompanied by:  Wife, [name]  PERTINENT HISTORY: Pt falls frequently, always in forward direction and insists on using back stairs  despite frequent falls over top step. Started Sinemet week of 11/04/22  PAIN:  Are you having pain? No  There were no vitals filed for this visit.    PRECAUTIONS: Fall  WEIGHT BEARING RESTRICTIONS: No  FALLS: Has patient fallen in last 6 months? Yes. Number of falls "more than I can count"  LIVING ENVIRONMENT: Lives with: lives with their spouse Lives in: House/apartment Stairs: Yes: External: 3 in front and 5 in back steps; on right going up, on left going up, can reach both, and always uses back steps (where he has the most falls) Has following equipment at home: Single point cane  PLOF: Independent  PATIENT GOALS: "I want to be in the next Olympics" "I would like to walk with minimal pain and get up and down with no assistance"  OBJECTIVE:   COGNITION: Overall cognitive status: Impaired   SENSATION: Denies numbness/tingling in BUE/BLEs  COORDINATION: Heel to shin test: WNL bilaterally   EDEMA: Frequent swelling in R ankle    POSTURE: rounded shoulders, forward head, and left pelvic obliquity  LOWER EXTREMITY MMT:  Tested in seated position   MMT Right Eval Left Eval  Hip flexion 4+ 5  Hip extension    Hip abduction 4 4  Hip adduction 5 5  Hip internal rotation    Hip external rotation    Knee flexion 5 5  Knee extension 5 5  Ankle dorsiflexion 3 5  Ankle plantarflexion    Ankle inversion    Ankle eversion    (Blank rows = not tested)   TODAY'S TREATMENT:      Therapeutic Activity:  5x sit <> stand: 14.1 seconds with no UE support with no episodes of retropulsion, much better form and set up today  Gait speed with RW: 17.53 seconds = 1.87 ft/sec TUG: 11 seconds with no AD with supervision/CGA    [facility] PT Assessment - 01/09/23 1122       6 Minute Walk- Baseline   6 Minute Walk- Baseline yes    BP (mmHg) 118/72    HR (bpm) 84    02 Sat (%RA) 98 %      6 Minute walk- Post Test   6 Minute Walk Post Test yes    BP (mmHg) 143/88    HR (bpm)  83    02 Sat (%RA) 98 %      6 minute walk test results    Aerobic Endurance Distance Walked 685   with RW   Endurance additional comments 1st lap: 110', last lap:118', pt reporting RPE as 5/10 afterwards             Gait pattern: step through pattern, decreased step length- Right, decreased stride length, decreased hip/knee flexion- Right, decreased ankle dorsiflexion- Right, shuffling, decreased trunk rotation, trunk flexed, and poor foot clearance- Right Distance walked: Various clinic distances + 115'  Assistive device utilized: Environmental consultant - 2 wheeled Level of assistance: SBA Comments: Pt needing cues for staying closer to RW, heel strike, and tall posture    PATIENT EDUCATION: Education details: continue HEP, working on sit <> stands with proper technique and incr step length/posture with gait, discussed walking program at home with RW with  beginning with 3-4 minutes and focusing on tall posture, stay close to RW, and step length. Went over with pt the benefits on using the RW for improved gait and decr freezing/festination episodes to decr pt's risk of falls.  Person educated: Patient and Spouse Education method: Explanation, Demonstration, Verbal cues, and Handouts Education comprehension: verbalized understanding, returned demonstration, verbal cues required, and needs further education  HOME EXERCISE PROGRAM: Walking program, starting with 3-4 minutes daily around the house with focus on tall posture, stride length, and staying close to RW  Seated PWR Up/Step  Standing PWR Rock/Step Supine PWR Up  Access Code: [email or web address] URL: [email or web address] Date: 01/07/2023 Prepared by: Sherlie Ban  Exercises - Sit to Stand Without Arm Support  - 1 x daily - 7 x weekly - 3 sets - 10 reps - Supine Bridge  - 1 x daily - 7 x weekly - 1-2 sets - 10 reps  GOALS: Goals reviewed with patient? Yes  LONG TERM GOALS: Target date: 01/10/2023  Pt will be independent with  final HEP for improved strength, balance, transfers and gait.  Baseline: pt reports not doing them as much as he should, reports he needs to do more  Goal status: IN PROGRESS  2.  Pt will improve gait velocity to at least 2.6 ft/s w/LRAD and SBA for improved gait efficiency and independence   Baseline: 1.95 ft/s w/o AD and min A  17.53 seconds = 1.87 ft/sec with RW   Goal status: NOT MET  3.  Pt will improve TUG time to 15 seconds or less with LRAD and supervision  in order to demo decrease fall risk. Baseline: 20 seconds with no AD, CGA  11 seconds with no AD with supervision/CGA  Goal status: MET  4.  Pt will improve 5 x STS to less than or equal to 15 seconds w/good posture and form to demonstrate improved functional strength and transfer efficiency.   Baseline: 15.41s w/BUE support, significant posterior lean and flexed posture   14.1 seconds with no UE support on 01/09/23 with no episodes of retropulsion  Goal status: MET  5.  Pt will trial various bracing options on RLE to reduce foot slap and fall frequency.  Baseline: bracing options not trialed, pt using a RW to decr fall risk.  Goal status: N/A    UPDATED/ONGOING LTGS FOR RE-CERT  LONG TERM GOALS: Target date: 02/10/2023  Pt will be independent with final HEP for improved strength, balance, transfers and gait.  Baseline: pt reports not doing them as much as he should, reports he needs to do more  Goal status: IN PROGRESS  2.  Pt will perform 10 reps of sit <> stands with no UE support and no retropulsion in order to demo improved transfer efficiency.   Baseline: Goal status: INITIAL  3.  Pt will perform car transfer with supervision in order to demo improved functional mobility. Baseline: pt reports difficulty getting in and out of the car Goal status: INITIAL  4.  Pt will perform floor transfer with CGA for fall recovery strategies  Baseline:  Goal status: INITIAL  5.  Pt will improve with RW to at  least 720' with RPE as a 3/10 or less  in order to demo improved gait efficiency/endurance Baseline: 685' with 5/10 RPE Goal status: INTIIAL   ASSESSMENT:  CLINICAL IMPRESSION: Today's skilled session focused on assessing pt's LTGs for re-cert. Pt met LTGs #3 and #4. Pt able to perform TUG with no AD in 11  seconds with need of supervision/CGA. Educated that pt still would need to use RW at all times to decr fall risk and improve gait mechanics. Pt improved 5x sit <> stand to 14.1 seconds with no UE support and no episodes of retropulsion. However, pt does still need cues at times for proper technique, esp with incr forward lean. Pt did not meet goal in regards to gait speed with RW. Pt performed in 1.87 ft/sec today, indicating that pt is a limited community ambulator.  Assessed with RW, with pt ambulating 685' with supervision. Pt frequently pushing RW too far anteriorly and it took >1 minute for pt to realize he was too far flexed forwards and needed to be closer. Otherwise, pt needed cues to stay closer. Pt will continue to benefit from skilled PT to address gait, balance, functional mobility, education regarding Parkinsonism in order to decr pt's risk of falls and safely improve independence. LTGs updated/revised as appropriate.    OBJECTIVE IMPAIRMENTS: Abnormal gait, decreased balance, decreased cognition, decreased coordination, decreased knowledge of condition, decreased knowledge of use of DME, decreased mobility, difficulty walking, decreased strength, decreased safety awareness, impaired perceived functional ability, improper body mechanics, postural dysfunction, and pain  ACTIVITY LIMITATIONS: carrying, lifting, bending, standing, squatting, stairs, transfers, bed mobility, reach over head, locomotion level, and caring for others  PARTICIPATION LIMITATIONS: medication management, interpersonal relationship, driving, shopping, community activity, and yard work  PERSONAL FACTORS: Age,  Behavior pattern, Fitness, Past/current experiences, and 1 comorbidity: Vascular parkinsonism  are also affecting patient's functional outcome.   REHAB POTENTIAL: Fair due to diagnosis   CLINICAL DECISION MAKING: Evolving/moderate complexity  EVALUATION COMPLEXITY: Moderate  PLAN:  PT FREQUENCY: 2x/week  PT DURATION: 8 weeks  PLANNED INTERVENTIONS: Therapeutic exercises, Therapeutic activity, Neuromuscular re-education, Balance training, Gait training, Patient/Family education, Self Care, Joint mobilization, Stair training, Vestibular training, Canalith repositioning, Orthotic/Fit training, DME instructions, Manual therapy, and Re-evaluation  PLAN FOR NEXT SESSION:  Continue to work on balance. Work on car transfers as pt reports that this was challenging. Review freezing/turns. RLE foot clearance tasks. practice gait with RW. trial braces on RLE. Floor transfer.    Drake Leach, PT, DPT 01/09/2023, 9:44 PM

## 2023-01-13 NOTE — Addendum Note (Signed)
Addended by: Drake Leach on: 01/13/2023 03:11 PM   Modules accepted: Orders

## 2023-01-15 ENCOUNTER — Ambulatory Visit: Payer: Medicare Other

## 2023-01-15 ENCOUNTER — Ambulatory Visit: Payer: Medicare Other | Admitting: Physical Therapy

## 2023-01-15 DIAGNOSIS — R2681 Unsteadiness on feet: Secondary | ICD-10-CM | POA: Diagnosis not present

## 2023-01-15 DIAGNOSIS — R471 Dysarthria and anarthria: Secondary | ICD-10-CM

## 2023-01-15 DIAGNOSIS — R293 Abnormal posture: Secondary | ICD-10-CM

## 2023-01-15 DIAGNOSIS — R2689 Other abnormalities of gait and mobility: Secondary | ICD-10-CM

## 2023-01-15 DIAGNOSIS — R296 Repeated falls: Secondary | ICD-10-CM

## 2023-01-15 NOTE — Therapy (Signed)
OUTPATIENT SPEECH LANGUAGE PATHOLOGY [facility] TREATMENT (PROGRESS NOTE)   Patient Name: [name] MRN: [id number] DOB:1945/10/15, 77 y.o., male Today's Date: 01/15/2023  PCP: Ronnald Nian, MD REFERRING PROVIDER: Vladimir Faster, DO  END OF SESSION:  End of Session - 01/15/23 1317     Visit Number 10    Number of Visits 13    Date for SLP Re-Evaluation 01/24/23    Authorization Type [employer]    Progress Note Due on Visit 10    SLP Start Time 1318    SLP Stop Time  1400    SLP Time Calculation (min) 42 min    Activity Tolerance Patient tolerated treatment well             Speech Therapy Progress Note  Dates of Reporting Period: 11-29-22 to current  Objective Reports of Subjective Statement: Pt has been seen for 10 ST visits targeting dysarthria. Pt reported inconsistency in speech clarity and volume.   Objective Measurements: See treatment notes for primary SLP analysis. Some mild improvements in volume and clarity exhibited but overall inconsistent. Question carryover and completion of HEP based on pt report, which appears related to reduced awareness and self-consciousness.   Goal Update: see goals below  Plan: Continue per POC  Reason Skilled Services are Required: Pt would continue to benefit from skilled ST intervention to optimize communication effectiveness.    Past Medical History:  Diagnosis Date   ADD (attention deficit disorder)    Allergic rhinitis    skin test 02/23/08 vaccine 2009; allergy shots weekly through Dr. Maple Hudson   Anemia    takes Fe -    Anginal pain (HCC)    Ankylosing spondylitis (HCC)    Anxiety    sees therapist Vernell Leep   Arthritis    Asthma    Dr. 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 Donnie Aho   CHEILECTOMY  10/16/2011   Procedure: CHEILECTOMY;  Surgeon: Sherri Rad, MD;  Location: [facility];  Service: Orthopedics;  Laterality: Right;   CHOLECYSTECTOMY     COLONOSCOPY     CORONARY ANGIOPLASTY     1995 after MI   CORONARY ANGIOPLASTY WITH STENT PLACEMENT     1999    ESOPHAGOGASTRODUODENOSCOPY  02/18/2012   Procedure: ESOPHAGOGASTRODUODENOSCOPY (EGD);  Surgeon: Florencia Reasons, MD;  Location: [facility];  Service: Endoscopy;  Laterality: N/A;  FOOT SURGERY     joint scraped, arthritis right foot   HERNIA REPAIR     LUMBAR DISC SURGERY     x2   right index finger  mass removed- arthritis     Dr. Amanda Pea   [facility]  Right 05/18/2021   medial toe consistent with callus   SKIN BIOPSY Left 05/17/2021   actinic keratosis   SPINAL CORD STIMULATOR BATTERY EXCHANGE N/A 10/11/2021   Procedure: SPINAL CORD STIMULATOR BATTERY EXCHANGE;  Surgeon: Venita Lick, MD;  Location: [facility];  Service: Orthopedics;  Laterality: N/A;  60 mins   SPINAL CORD STIMULATOR INSERTION N/A 11/21/2016   Procedure: LUMBAR SPINAL CORD STIMULATOR INSERTION;  Surgeon: Venita Lick, MD;  Location: [facility];  Service: Orthopedics;  Laterality: N/A;  Requests 2.5 hrs    TONSILLECTOMY     1953   UPPER GI ENDOSCOPY  [id number]   VASECTOMY     1980   Patient Active Problem List   Diagnosis Date Noted   OSA (obstructive sleep apnea) 11/12/2022   Parkinson's disease 11/12/2022   Peripheral vascular disease (HCC) 12/25/2021   Dizziness 12/24/2021   Leg pain, left 11/14/2021   Chronic low back pain with sciatica 11/14/2021   Spinal cord stimulator status 11/14/2021   Muscle spasm 11/14/2021   Constipation 09/11/2021   Slow transit constipation 09/11/2021   Cough 09/11/2021   Early satiety 09/11/2021   Hoarseness 09/11/2021   Iron deficiency 09/11/2021   Personal history of colonic polyps 09/11/2021   Weight decreased 09/11/2021   Esophageal motility disorder 09/11/2021   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   Pain in both feet 08/10/2021   Atherosclerosis of aorta (HCC) 08/30/2020   Mild concentric left ventricular hypertrophy (LVH)    Hypogonadism male    History of kidney stones    Erectile dysfunction    COPD (chronic obstructive pulmonary disease) (HCC)    Acquired trigger finger of left middle finger 04/22/2019   Acquired trigger finger of right middle finger 04/22/2019   Left ventricular dysfunction 10/12/2018   Ankylosing spondylitis (HCC) 10/09/2018   Presbycusis of both ears 04/23/2018   Mild intermittent asthma without complication 04/23/2018   Chronic pain 11/21/2016   Arthritis 10/15/2016   Spondylolisthesis of lumbar region 02/28/2015   Hyperlipidemia    Lumbar disc disease    Hypertension    CAD (coronary artery disease) 05/19/2012   GERD (gastroesophageal reflux disease)    Depression with anxiety    Chronic asthma 05/17/2011   Allergic rhinitis due to pollen 08/08/2010   History of benign positional vertigo    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  Patient was in the emergency room on April 1 with mental status change.  Patient had apparently had a URI and had been taking Mucinex and had some degree of confusion with the URI/medication.  Patient had gotten in the shower and his wife went into check on him 30 minutes later and found that he had fallen.  There was apparently no syncope and patient did not hit his head.  Patient went to the hospital and was found to be hypokalemic at 2.3.  Patient did not have MRI because of spinal cord  stimulator.  Patient followed up with primary care physician, at which point they noted that patient had been having a chronic change in gait with forward leaning trunk and gait for several months (perhaps longer per pt/wife)   PAIN:  Are you having pain? No  PATIENT GOALS: "get back to singing"  OBJECTIVE:   TODAY'S TREATMENT:                                                                                                                                         01/15/23: Forgot Speak Out! Book and couldn't recall current lesson. Per pt report, it appears self-consciousness and perception of over-loudness are barriers to completing recommended HEP. Reviewed SOVT exercises with intermittent mod A required to optimize effort and accuracy. Has not been completing at home. Targeted conversational speech with SLP providing intermittent cues to project voice when intensity decayed and hoarseness evident. Occasional self-corrections exhibited but overall inconsistent awareness. Re-educated importance of completing HEP to maximize carryover.    01/09/23: Reviewed recommendation for vocal warm up exercises to reduce hoarseness when participating in choir practice. Led pt through semi occluded vocal tract exercises with use of water/straw to provide  biofeedback for flow of exhalation. Pt with teach back of exercise with 80% accuracy. Target improved vocal quality following straw phonation during singing exercises, with pt demonstrating clear voicing with usual mod-A. Pt presenting with x4 throat clears initial 8 minutes. Does not recall any avoidance strategies. Provided handout, allowed pt to read. Pt ID "pursed lip breathing" strategy as one he feels he can implement when urge to clear throat occurs. Pt required ongoing cues to ID throat clears and attempt implementation of alternative.    PATIENT EDUCATION: Education details: see above.  Person educated: Patient and Spouse Education method: Medical illustrator Education comprehension: verbalized understanding and returned demonstration  HOME EXERCISE PROGRAM: Speak Out!    GOALS: Goals reviewed with patient? Yes  SHORT TERM GOALS: Target date: 12/27/2022  Pt will complete HEP at least 1x/day given occasional min A over 2 sessions  Baseline: Goal status: Deferred--Pt has not received Speak Out! Book yet.   2.  Pt will meet or exceed dB targets during reading exercises with 90% accuracy with rare min-A.  Baseline:  Goal status: MET  3.  Pt will demonstrate SOVTE and vocal function exercises with mod-I.  Baseline:  Goal status: MET  LONG TERM GOALS: Target date: 01/10/2023 (POC date=01/24/23)  1.  Pt and spouse will report reduced need to repeat information at home > 1 week.  Baseline: 21.5/32 Goal status: IN PROGRESS  2.  Pt will utilize dysarthria compensations in 5-10 minute conversation and maintain WNL conversational volume (70-72 dB) given occasional min A over 2 sessions   Baseline:  Goal status: IN PROGRESS  3.  Pt will demonstrate intentional voice and flow phonation during singing exercise with min-A  Baseline:  Goal  status: IN PROGRESS   4.  Pt will report improvement via PROM by d/c   Baseline: CPIB=21.5  Goal status: IN PROGRESS    ASSESSMENT:  CLINICAL IMPRESSION: Patient is a 78 y.o. M who was seen today for motor speech s/p hoarse voice and decreased volume. Patient was able to increase volume upon initiation of phrases but needed prompting to keep his volume up during long, unstructured conversation. Patient encouraged to use his "presenting" voice to speak up and out with intent. Pt's speech is c/b intermittent hoarse vocal quality, reduced vocal intensity, volume decay. Conducted ongoing education and training of Speak Out! Program to maximize volume intensity and clarity with intermittent to usual mod A required at this time to achieve targeted dB levels. Pt would benefit from skilled ST to address aforementioned deficits to enhance communication efficacy.    OBJECTIVE IMPAIRMENTS: Objective impairments include dysarthria. These impairments are limiting patient from effectively communicating at home and in community.Factors affecting potential to achieve goals and functional outcome are co-morbidities and medical prognosis. Patient will benefit from skilled SLP services to address above impairments and improve overall function.  REHAB POTENTIAL: Excellent  PLAN:  SLP FREQUENCY: 1-2x/week  SLP DURATION: 8 weeks   PLANNED INTERVENTIONS: Cueing hierachy, Oral motor exercises, SLP instruction and feedback, Compensatory strategies, and Patient/family education  Gracy Racer, CCC-SLP 01/15/2023, 3:20 PM

## 2023-01-15 NOTE — Therapy (Signed)
OUTPATIENT PHYSICAL THERAPY NEURO TREATMENT   Patient Name: [name] MRN: [id number] DOB:May 02, 1946, 77 y.o., male Today's Date: 01/15/2023   PCP: Ronnald Nian, MD  REFERRING PROVIDER: Vladimir Faster, DO  END OF SESSION:  PT End of Session - 01/15/23 1404     Visit Number 17    Number of Visits 30   Plus eval   Date for PT Re-Evaluation 03/10/23    Authorization Type [employer]    Progress Note Due on Visit 10    PT Start Time 1403   Pt in restroom for 12 minutes, so not billed for that time   PT Stop Time 1446    PT Time Calculation (min) 43 min    Equipment Utilized During Treatment Gait belt    Activity Tolerance Patient tolerated treatment well    Behavior During Therapy WFL for tasks assessed/performed                   Past Medical History:  Diagnosis Date   ADD (attention deficit disorder)    Allergic rhinitis    skin test 02/23/08 vaccine 2009; allergy shots weekly through Dr. Maple Hudson   Anemia    takes Fe -    Anginal pain (HCC)    Ankylosing spondylitis (HCC)    Anxiety    sees therapist Vernell Leep   Arthritis    Asthma    Dr. 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 Donnie Aho   CHEILECTOMY  10/16/2011   Procedure: CHEILECTOMY;  Surgeon: Sherri Rad, MD;  Location: [facility];  Service: Orthopedics;  Laterality: Right;   CHOLECYSTECTOMY     COLONOSCOPY     CORONARY ANGIOPLASTY     1995 after MI   CORONARY ANGIOPLASTY WITH STENT PLACEMENT     1999    ESOPHAGOGASTRODUODENOSCOPY  02/18/2012   Procedure: ESOPHAGOGASTRODUODENOSCOPY (EGD);  Surgeon: Florencia Reasons, MD;  Location: [facility];  Service: Endoscopy;  Laterality: N/A;   FOOT SURGERY     joint scraped, arthritis right foot   HERNIA REPAIR     LUMBAR DISC SURGERY     x2   right index finger  mass removed- arthritis     Dr. Amanda Pea   [facility]  Right 05/18/2021   medial toe consistent with callus   SKIN BIOPSY Left 05/17/2021   actinic keratosis  SPINAL CORD STIMULATOR BATTERY EXCHANGE N/A 10/11/2021   Procedure: SPINAL CORD STIMULATOR BATTERY EXCHANGE;  Surgeon: Venita Lick, MD;  Location: [facility];  Service: Orthopedics;  Laterality: N/A;  60 mins   SPINAL CORD STIMULATOR INSERTION N/A 11/21/2016   Procedure: LUMBAR SPINAL CORD STIMULATOR INSERTION;  Surgeon: Venita Lick, MD;  Location: [facility];  Service: Orthopedics;  Laterality: N/A;  Requests 2.5 hrs   TONSILLECTOMY     1953   UPPER GI ENDOSCOPY  [id number]   VASECTOMY     1980   Patient Active Problem List   Diagnosis Date Noted   OSA (obstructive sleep apnea) 11/12/2022   Parkinson's disease 11/12/2022   Peripheral vascular disease (HCC) 12/25/2021   Dizziness 12/24/2021   Leg pain, left 11/14/2021   Chronic low back pain with sciatica 11/14/2021   Spinal cord stimulator status 11/14/2021   Muscle spasm 11/14/2021   Constipation 09/11/2021   Slow transit constipation 09/11/2021   Cough 09/11/2021   Early satiety 09/11/2021   Hoarseness  09/11/2021   Iron deficiency 09/11/2021   Personal history of colonic polyps 09/11/2021   Weight decreased 09/11/2021   Esophageal motility disorder 09/11/2021   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   Pain in both feet 08/10/2021   Atherosclerosis of aorta (HCC) 08/30/2020   Mild concentric left ventricular hypertrophy (LVH)    Hypogonadism male    History of kidney stones    Erectile dysfunction    COPD (chronic obstructive pulmonary disease) (HCC)    Acquired trigger finger of left middle finger 04/22/2019   Acquired trigger finger of right middle finger 04/22/2019   Left ventricular dysfunction 10/12/2018   Ankylosing spondylitis (HCC) 10/09/2018   Presbycusis of both ears 04/23/2018   Mild intermittent asthma without complication 04/23/2018   Chronic pain 11/21/2016   Arthritis 10/15/2016   Spondylolisthesis of lumbar region 02/28/2015   Hyperlipidemia    Lumbar disc disease    Hypertension    CAD (coronary artery disease) 05/19/2012   GERD (gastroesophageal reflux disease)    Depression with anxiety    Chronic asthma 05/17/2011   Allergic rhinitis due to pollen 08/08/2010   History of benign positional vertigo    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 Stayed alone this weekend while [name] went to [place] with their daughter. States everything was fine, no falls. Requesting to use restroom after providing subjective.  Pt accompanied by:  Wife,  [name]  PERTINENT HISTORY: Pt falls frequently, always in forward direction and insists on using back stairs despite frequent falls over top step. Started Sinemet week of 11/04/22  PAIN:  Are you having pain? No  There were no vitals filed for this visit.    PRECAUTIONS: Fall  WEIGHT BEARING RESTRICTIONS: No  FALLS: Has patient fallen in last 6 months? Yes. Number of falls "more than I can count"  LIVING ENVIRONMENT: Lives with: lives with their spouse Lives in: House/apartment Stairs: Yes: External: 3 in front and 5 in back steps; on right going up, on left going up, can reach both, and always uses back steps (where he has the most falls) Has following equipment at home: Single point cane  PLOF: Independent  PATIENT GOALS: "I want to be in the next Olympics" "I would like to walk with minimal pain and get up and down with no assistance"  OBJECTIVE:   COGNITION: Overall cognitive status: Impaired   SENSATION: Denies numbness/tingling in BUE/BLEs  COORDINATION: Heel to shin test: WNL bilaterally   EDEMA: Frequent swelling in R ankle    POSTURE: rounded shoulders, forward head, and left pelvic obliquity  LOWER EXTREMITY MMT:  Tested in seated position   MMT Right Eval Left Eval  Hip flexion 4+ 5  Hip extension    Hip abduction 4 4  Hip adduction 5 5  Hip internal rotation    Hip external rotation    Knee flexion 5 5  Knee extension 5 5  Ankle dorsiflexion 3 5  Ankle plantarflexion    Ankle inversion    Ankle eversion    (Blank rows = not tested)   TODAY'S TREATMENT:    While pt in restroom, pt's wife reports pt continues to have difficulty getting out of his car due to the low height. Wife also reports that her and her daughter have been discouraging pt from driving and pt has been receptive   NMR  Practiced sit to stands from low surface to imitate getting into/out of car. Started w/20" height and progressed to 14" height w/intermittent UE support. Pt  able to perform x5 reps well from 20" height without UE support, but at 16", pt much more hesitant to perform. Max cues for fast anterior weight shift and to use momentum to stand, but pt stopping his momentum once he reaches "nose over toes" position. Began to discuss pt's most recent paintings to create distraction from task, and pt able to stand without UE support on first attempt. Pt then performed a total of 5 reps w/o UE support from 16". Lastly, pt performed 2 reps of sit to stand from 14" box without UE support w/max encouraging cues and multiple attempts. Pt frequently losing balance posteriorly and then reported he leans against car frame when standing from car to "make sure car is still there". Educated pt on importance of avoiding posterior lean when performing transfer, as this will make him lose balance and rely on car for stabilization. Informed pt to reach back for car frame instead of leaning for improved stability and safety. Pt verbalized understanding.      Gait pattern: step through pattern, decreased step length- Right, decreased stride length, decreased hip/knee flexion- Right, decreased ankle dorsiflexion- Right, shuffling, decreased trunk rotation,  trunk flexed, and poor foot clearance- Right Distance walked: Various clinic distances + 115'  Assistive device utilized: Environmental consultant - 2 wheeled Level of assistance: SBA Comments: Pt needing cues for staying closer to RW, heel strike, and tall posture    PATIENT EDUCATION: Education details: continue HEP, working on sit <> stands with proper technique and incr step length/posture with gait, practicing car transfers at home  Person educated: Patient and Spouse Education method: Programmer, multimedia, Facilities manager, Verbal cues, and Handouts Education comprehension: verbalized understanding, returned demonstration, verbal cues required, and needs further education  HOME EXERCISE PROGRAM: Walking program, starting with 3-4 minutes daily around  the house with focus on tall posture, stride length, and staying close to RW  Seated PWR Up/Step  Standing PWR Rock/Step Supine PWR Up  Access Code: [email or web address] URL: [email or web address] Date: 01/07/2023 Prepared by: Sherlie Ban  Exercises - Sit to Stand Without Arm Support  - 1 x daily - 7 x weekly - 3 sets - 10 reps - Supine Bridge  - 1 x daily - 7 x weekly - 1-2 sets - 10 reps  GOALS: Goals reviewed with patient? Yes  LONG TERM GOALS: Target date: 01/10/2023  Pt will be independent with final HEP for improved strength, balance, transfers and gait.  Baseline: pt reports not doing them as much as he should, reports he needs to do more  Goal status: IN PROGRESS  2.  Pt will improve gait velocity to at least 2.6 ft/s w/LRAD and SBA for improved gait efficiency and independence   Baseline: 1.95 ft/s w/o AD and min A  17.53 seconds = 1.87 ft/sec with RW   Goal status: NOT MET  3.  Pt will improve TUG time to 15 seconds or less with LRAD and supervision  in order to demo decrease fall risk. Baseline: 20 seconds with no AD, CGA  11 seconds with no AD with supervision/CGA  Goal status: MET  4.  Pt will improve 5 x STS to less than or equal to 15 seconds w/good posture and form to demonstrate improved functional strength and transfer efficiency.   Baseline: 15.41s w/BUE support, significant posterior lean and flexed posture   14.1 seconds with no UE support on 01/09/23 with no episodes of retropulsion  Goal status: MET  5.  Pt will trial various bracing options on RLE to reduce foot slap and fall frequency.  Baseline: bracing options not trialed, pt using a RW to decr fall risk.  Goal status: N/A    UPDATED/ONGOING LTGS FOR RE-CERT  LONG TERM GOALS: Target date: 02/10/2023  Pt will be independent with final HEP for improved strength, balance, transfers and gait.  Baseline: pt reports not doing them as much as he should, reports he needs to do more  Goal  status: IN PROGRESS  2.  Pt will perform 10 reps of sit <> stands with no UE support and no retropulsion in order to demo improved transfer efficiency.   Baseline: Goal status: INITIAL  3.  Pt will perform car transfer with supervision in order to demo improved functional mobility. Baseline: pt reports difficulty getting in and out of the car Goal status: INITIAL  4.  Pt will perform floor transfer with CGA for fall recovery strategies  Baseline:  Goal status: INITIAL  5.  Pt will improve with RW to at least 720' with RPE as a 3/10 or less  in order to demo improved gait efficiency/endurance Baseline: 685' with 5/10 RPE Goal status: INTIIAL   ASSESSMENT:  CLINICAL IMPRESSION: Session limited as pt in restroom for 12 minutes during session time. Emphasis of skilled PT session on mass practice of sit to stands from low surface to imitate car transfers. Pt very challenged by standing from low chair height (14-16") but did improve performance when distracted from task. Pt reports he leans against car door frame when standing from car as this helps him feel secure, but educated pt on how this is throwing off his balance. Pt verbalized understanding but could benefit from continued practice in clinic. Continue POC.    OBJECTIVE IMPAIRMENTS: Abnormal gait, decreased balance, decreased cognition, decreased coordination, decreased knowledge of condition, decreased knowledge of use of DME, decreased mobility, difficulty walking, decreased strength, decreased safety awareness, impaired perceived functional ability, improper body mechanics, postural dysfunction, and pain  ACTIVITY LIMITATIONS: carrying, lifting, bending, standing, squatting, stairs, transfers, bed mobility, reach over head, locomotion level, and caring for others  PARTICIPATION LIMITATIONS: medication management, interpersonal relationship, driving, shopping, community activity, and yard work  PERSONAL FACTORS: Age, Behavior  pattern, Fitness, Past/current experiences, and 1 comorbidity: Vascular parkinsonism  are also affecting patient's functional outcome.   REHAB POTENTIAL: Fair due to diagnosis   CLINICAL DECISION MAKING: Evolving/moderate complexity  EVALUATION COMPLEXITY: Moderate  PLAN:  PT FREQUENCY: 2x/week  PT DURATION: 8 weeks  PLANNED INTERVENTIONS: Therapeutic exercises, Therapeutic activity, Neuromuscular re-education, Balance training, Gait training, Patient/Family education, Self Care, Joint mobilization, Stair training, Vestibular training, Canalith repositioning, Orthotic/Fit training, DME instructions, Manual therapy, and Re-evaluation  PLAN FOR NEXT SESSION:  Continue to work on balance. Work on car transfers as pt reports that this was challenging. Review freezing/turns. RLE foot clearance tasks. practice gait with RW. trial braces on RLE. Floor transfer.    Jill Alexanders Joanathan Affeldt, PT, DPT 01/15/2023, 2:57 PM

## 2023-01-17 ENCOUNTER — Ambulatory Visit: Payer: Medicare Other | Admitting: Physical Therapy

## 2023-01-21 ENCOUNTER — Ambulatory Visit: Payer: Medicare Other | Admitting: Physical Therapy

## 2023-01-21 DIAGNOSIS — R293 Abnormal posture: Secondary | ICD-10-CM

## 2023-01-21 DIAGNOSIS — M5459 Other low back pain: Secondary | ICD-10-CM

## 2023-01-21 DIAGNOSIS — R2689 Other abnormalities of gait and mobility: Secondary | ICD-10-CM

## 2023-01-21 DIAGNOSIS — R2681 Unsteadiness on feet: Secondary | ICD-10-CM

## 2023-01-21 NOTE — Therapy (Signed)
OUTPATIENT PHYSICAL THERAPY NEURO TREATMENT   Patient Name: [name] MRN: [id number] DOB:03-27-1946, 77 y.o., male Today's Date: 01/21/2023   PCP: Ronnald Nian, MD  REFERRING PROVIDER: Vladimir Faster, DO  END OF SESSION:  PT End of Session - 01/21/23 1109     Visit Number 18    Number of Visits 30   Plus eval   Date for PT Re-Evaluation 03/10/23    Authorization Type [employer]    Progress Note Due on Visit 10    PT Start Time 1106   Previous pt session ran late   PT Stop Time 1147    PT Time Calculation (min) 41 min    Equipment Utilized During Treatment Gait belt    Activity Tolerance Patient limited by pain    Behavior During Therapy WFL for tasks assessed/performed                   Past Medical History:  Diagnosis Date   ADD (attention deficit disorder)    Allergic rhinitis    skin test 02/23/08 vaccine 2009; allergy shots weekly through Dr. Maple Hudson   Anemia    takes Fe -    Anginal pain (HCC)    Ankylosing spondylitis (HCC)    Anxiety    sees therapist Vernell Leep   Arthritis    Asthma    Dr. 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 Donnie Aho   CHEILECTOMY  10/16/2011   Procedure: CHEILECTOMY;  Surgeon: Sherri Rad, MD;  Location: [facility];  Service: Orthopedics;  Laterality: Right;   CHOLECYSTECTOMY     COLONOSCOPY     CORONARY ANGIOPLASTY     1995 after MI   CORONARY ANGIOPLASTY WITH STENT PLACEMENT     1999    ESOPHAGOGASTRODUODENOSCOPY  02/18/2012   Procedure: ESOPHAGOGASTRODUODENOSCOPY (EGD);  Surgeon: Florencia Reasons, MD;  Location: [facility];  Service: Endoscopy;  Laterality: N/A;   FOOT SURGERY     joint scraped, arthritis right foot   HERNIA REPAIR     LUMBAR DISC SURGERY     x2   right index finger  mass removed- arthritis     Dr. Amanda Pea   [facility]  Right 05/18/2021   medial toe consistent with callus   SKIN BIOPSY Left 05/17/2021   actinic keratosis   SPINAL CORD STIMULATOR BATTERY EXCHANGE N/A 10/11/2021  Procedure: SPINAL CORD STIMULATOR BATTERY EXCHANGE;  Surgeon: Venita Lick, MD;  Location: [facility] OR;  Service: Orthopedics;  Laterality: N/A;  60 mins   SPINAL CORD STIMULATOR INSERTION N/A 11/21/2016   Procedure: LUMBAR SPINAL CORD STIMULATOR INSERTION;  Surgeon: Venita Lick, MD;  Location: [facility] OR;  Service: Orthopedics;  Laterality: N/A;  Requests 2.5 hrs   TONSILLECTOMY     1953   UPPER GI ENDOSCOPY  [id number]   VASECTOMY     1980   Patient Active Problem List   Diagnosis Date Noted   OSA (obstructive sleep apnea) 11/12/2022   Parkinson's disease 11/12/2022   Peripheral vascular disease (HCC) 12/25/2021   Dizziness 12/24/2021   Leg pain, left 11/14/2021   Chronic low back pain with sciatica 11/14/2021   Spinal cord stimulator status 11/14/2021   Muscle spasm 11/14/2021   Constipation 09/11/2021   Slow transit constipation 09/11/2021   Cough 09/11/2021   Early satiety 09/11/2021   Hoarseness 09/11/2021   Iron deficiency  09/11/2021   Personal history of colonic polyps 09/11/2021   Weight decreased 09/11/2021   Esophageal motility disorder 09/11/2021   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   Pain in both feet 08/10/2021   Atherosclerosis of aorta (HCC) 08/30/2020   Mild concentric left ventricular hypertrophy (LVH)    Hypogonadism male    History of kidney stones    Erectile dysfunction    COPD (chronic obstructive pulmonary disease) (HCC)    Acquired trigger finger of left middle finger 04/22/2019   Acquired trigger finger of right middle finger 04/22/2019   Left ventricular dysfunction 10/12/2018   Ankylosing spondylitis (HCC) 10/09/2018   Presbycusis of both ears 04/23/2018   Mild intermittent asthma without complication 04/23/2018   Chronic pain 11/21/2016   Arthritis 10/15/2016   Spondylolisthesis of lumbar region 02/28/2015   Hyperlipidemia    Lumbar disc disease    Hypertension    CAD (coronary artery disease) 05/19/2012   GERD (gastroesophageal reflux disease)    Depression with anxiety    Chronic asthma 05/17/2011   Allergic rhinitis due to pollen 08/08/2010   History of benign positional vertigo    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 Still having difficulty getting into/out of a car. Wearing a CPAP now, has woken up twice with the mask off his face. Does not know how it "fell off". Denies falls but reports he  had "freezing feet" this weekend.   Pt accompanied by:  Wife, [name]  PERTINENT HISTORY: Pt falls frequently, always in forward direction and insists on using back stairs despite frequent falls over top step. Started Sinemet week of 11/04/22  PAIN:  Are you having pain? No  There were no vitals filed for this visit.   PRECAUTIONS: Fall  WEIGHT BEARING RESTRICTIONS: No  FALLS: Has patient fallen in last 6 months? Yes. Number of falls "more than I can count"  LIVING ENVIRONMENT: Lives with: lives with their spouse Lives in: House/apartment Stairs: Yes: External: 3 in front and 5 in back steps; on right going up, on left going up, can reach both, and always uses back steps (where he has the most falls) Has following equipment at home: Single point cane  PLOF: Independent  PATIENT GOALS: "I want to be in the next Olympics" "I would like to walk with minimal pain and get up and down with no assistance"  OBJECTIVE:   COGNITION: Overall cognitive status: Impaired   SENSATION: Denies numbness/tingling in BUE/BLEs  COORDINATION: Heel to shin test: WNL bilaterally   EDEMA: Frequent swelling in R ankle    POSTURE: rounded shoulders, forward head, and left pelvic obliquity  LOWER EXTREMITY MMT:  Tested in seated position   MMT Right Eval Left Eval  Hip flexion 4+ 5  Hip extension    Hip abduction 4 4  Hip adduction 5 5  Hip internal rotation    Hip external rotation    Knee flexion 5 5  Knee extension 5 5  Ankle dorsiflexion 3 5  Ankle plantarflexion    Ankle inversion    Ankle eversion    (Blank rows = not tested)   TODAY'S TREATMENT:    NMR  Attempted to perform floor transfer this date using teach-back method and test retention from previous sessions. Pt impulsively slid forward off of mat onto floor mat and landed hard on his hands and knees prior to therapist instructing pt to perform proper transfer. Pt reported feeling pain in his back, lengthy discussion  informing pt on safety risks associated w/sliding off of the mat table and provided max education regarding proper technique. Pt was able to perform proper technique following therapist's cues, states he does not remember prior education in previous sessions.   Ther Ex  Remainder of session spent rehabbing pt's back as he reported high levels of pain and tightness in L hip adductors/quads:  Supine LTRS, x10 per side. Decreased ROM to L side > R side  Modified Thomas stretch on R side, x3 minutes. Very painful for pt and pt unable to lay his leg on the mat fully due to hip flexor tightness. Added to HEP (see bolded below)  Supine butterfly stretch, x2 minutes. Pt unable to hold position without therapist assistance.  Supine glute bridges, x8 reps. Pt unable to lift hips off mat initially, was unaware this was an exercise on his HEP  Seated hamstring stretch, x90 seconds per side. Min cues to maintain knee extension if possible, as pt bending his knee purposefully when leaning forward.      Gait pattern: step through pattern, decreased step length- Right, decreased stride length,  decreased hip/knee flexion- Right, decreased ankle dorsiflexion- Right, shuffling, decreased trunk rotation, trunk flexed, and poor foot clearance- Right Distance walked: Various clinic distances   Assistive device utilized: Walker - 2 wheeled Level of assistance: SBA Comments: Pt needing cues for staying closer to RW, heel strike, and tall posture. Increased forward flexed posture this date    PATIENT EDUCATION: Education details: Updates to HEP, importance of proper body mechanics and SLOWING DOWN to ensure he does not injure himself, review of proper floor transfer  Person educated: Patient and Spouse Education method: Explanation, Demonstration, Verbal cues, and Handouts Education comprehension: verbalized understanding, returned demonstration, verbal cues required, and needs further education  HOME EXERCISE  PROGRAM: Walking program, starting with 3-4 minutes daily around the house with focus on tall posture, stride length, and staying close to RW  Seated PWR Up/Step  Standing PWR Rock/Step Supine PWR Up  Access Code: [email or web address] URL: [email or web address] Date: 01/07/2023 Prepared by: Sherlie Ban  Exercises - Sit to Stand Without Arm Support  - 1 x daily - 7 x weekly - 3 sets - 10 reps - Supine Bridge  - 1 x daily - 7 x weekly - 1-2 sets - 10 reps - Modified Thomas Stretch  - 1 x daily - 7 x weekly - 3-5 minute hold  GOALS: Goals reviewed with patient? Yes  LONG TERM GOALS: Target date: 01/10/2023  Pt will be independent with final HEP for improved strength, balance, transfers and gait.  Baseline: pt reports not doing them as much as he should, reports he needs to do more  Goal status: IN PROGRESS  2.  Pt will improve gait velocity to at least 2.6 ft/s w/LRAD and SBA for improved gait efficiency and independence   Baseline: 1.95 ft/s w/o AD and min A  17.53 seconds = 1.87 ft/sec with RW   Goal status: NOT MET  3.  Pt will improve TUG time to 15 seconds or less with LRAD and supervision  in order to demo decrease fall risk. Baseline: 20 seconds with no AD, CGA  11 seconds with no AD with supervision/CGA  Goal status: MET  4.  Pt will improve 5 x STS to less than or equal to 15 seconds w/good posture and form to demonstrate improved functional strength and transfer efficiency.   Baseline: 15.41s w/BUE support, significant posterior lean and flexed posture   14.1 seconds with no UE support on 01/09/23 with no episodes of retropulsion  Goal status: MET  5.  Pt will trial various bracing options on RLE to reduce foot slap and fall frequency.  Baseline: bracing options not trialed, pt using a RW to decr fall risk.  Goal status: N/A    UPDATED/ONGOING LTGS FOR RE-CERT  LONG TERM GOALS: Target date: 02/10/2023  Pt will be independent with final HEP for improved  strength, balance, transfers and gait.  Baseline: pt reports not doing them as much as he should, reports he needs to do more  Goal status: IN PROGRESS  2.  Pt will perform 10 reps of sit <> stands with no UE support and no retropulsion in order to demo improved transfer efficiency.   Baseline: Goal status: INITIAL  3.  Pt will perform car transfer with supervision in order to demo improved functional mobility. Baseline: pt reports difficulty getting in and out of the car Goal status: INITIAL  4.  Pt will perform floor transfer with CGA for fall recovery strategies  Baseline:  Goal status: INITIAL  5.  Pt will improve with RW to at least 720' with RPE as a 3/10 or less  in order to demo improved gait efficiency/endurance Baseline: 685' with 5/10 RPE Goal status: INTIIAL   ASSESSMENT:  CLINICAL IMPRESSION: Emphasis of skilled PT session on proper floor transfers and improved functional mobility. Session limited as pt impulsively slid forward off mat table onto floor mat w/poor eccentric control rather than perform proper transfer as requested and resulted in low back discomfort. Remainder of session spent performing gentle mobility to low back and LLE, as pt demonstrates significant tightness and discomfort in L hip adductors and hip flexors. Pt denied pain at end of session, but was uncomfortable during several stretches due to tightness. Continue POC.    OBJECTIVE IMPAIRMENTS: Abnormal gait, decreased balance, decreased cognition, decreased coordination, decreased knowledge of condition, decreased knowledge of use of DME, decreased mobility, difficulty walking, decreased strength, decreased safety awareness, impaired perceived functional ability, improper body mechanics, postural dysfunction, and pain  ACTIVITY LIMITATIONS: carrying, lifting, bending, standing, squatting, stairs, transfers, bed mobility, reach over head, locomotion level, and caring for others  PARTICIPATION  LIMITATIONS: medication management, interpersonal relationship, driving, shopping, community activity, and yard work  PERSONAL FACTORS: Age, Behavior pattern, Fitness, Past/current experiences, and 1 comorbidity: Vascular parkinsonism  are also affecting patient's functional outcome.   REHAB POTENTIAL: Fair due to diagnosis   CLINICAL DECISION MAKING: Evolving/moderate complexity  EVALUATION COMPLEXITY: Moderate  PLAN:  PT FREQUENCY: 2x/week  PT DURATION: 8 weeks  PLANNED INTERVENTIONS: Therapeutic exercises, Therapeutic activity, Neuromuscular re-education, Balance training, Gait training, Patient/Family education, Self Care, Joint mobilization, Stair training, Vestibular training, Canalith repositioning, Orthotic/Fit training, DME instructions, Manual therapy, and Re-evaluation  PLAN FOR NEXT SESSION:  Continue to work on balance. Work on car transfers as pt reports that this was challenging. Review freezing/turns. RLE foot clearance tasks. practice gait with RW. trial braces on RLE. Floor transfer.    Jill Alexanders Chareese Sergent, PT, DPT 01/21/2023, 11:52 AM

## 2023-01-23 ENCOUNTER — Ambulatory Visit: Payer: Medicare Other | Admitting: Physical Therapy

## 2023-01-23 DIAGNOSIS — R2689 Other abnormalities of gait and mobility: Secondary | ICD-10-CM

## 2023-01-23 DIAGNOSIS — R293 Abnormal posture: Secondary | ICD-10-CM

## 2023-01-23 DIAGNOSIS — R2681 Unsteadiness on feet: Secondary | ICD-10-CM | POA: Diagnosis not present

## 2023-01-23 DIAGNOSIS — R296 Repeated falls: Secondary | ICD-10-CM

## 2023-01-23 NOTE — Therapy (Signed)
OUTPATIENT PHYSICAL THERAPY NEURO TREATMENT   Patient Name: [name] MRN: [id number] DOB:May 04, 1946, 77 y.o., male Today's Date: 01/23/2023   PCP: Ronnald Nian, MD  REFERRING PROVIDER: Vladimir Faster, DO  END OF SESSION:  PT End of Session - 01/23/23 1239     Visit Number 19    Number of Visits 30   Plus eval   Date for PT Re-Evaluation 03/10/23    Authorization Type [employer] Medicare    Progress Note Due on Visit 10    PT Start Time 1237   Pt arrived late   PT Stop Time 1319    PT Time Calculation (min) 42 min    Equipment Utilized During Treatment Gait belt    Activity Tolerance Patient tolerated treatment well    Behavior During Therapy WFL for tasks assessed/performed;Lability                   Past Medical History:  Diagnosis Date   ADD (attention deficit disorder)    Allergic rhinitis    skin test 02/23/08 vaccine 2009; allergy shots weekly through Dr. Maple Hudson   Anemia    takes Fe -    Anginal pain (HCC)    Ankylosing spondylitis (HCC)    Anxiety    sees therapist Vernell Leep   Arthritis    Asthma    Dr. 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 Donnie Aho   CHEILECTOMY  10/16/2011   Procedure: CHEILECTOMY;  Surgeon: Sherri Rad, MD;  Location: [facility];  Service: Orthopedics;  Laterality: Right;   CHOLECYSTECTOMY     COLONOSCOPY     CORONARY ANGIOPLASTY     1995 after MI   CORONARY ANGIOPLASTY WITH STENT PLACEMENT     1999    ESOPHAGOGASTRODUODENOSCOPY  02/18/2012   Procedure: ESOPHAGOGASTRODUODENOSCOPY (EGD);  Surgeon: Florencia Reasons, MD;  Location: [facility];  Service: Endoscopy;  Laterality: N/A;   FOOT SURGERY     joint scraped, arthritis right foot   HERNIA REPAIR     LUMBAR DISC SURGERY     x2   right index finger  mass removed- arthritis     Dr. Amanda Pea   [facility]  Right 05/18/2021   medial toe consistent with callus   SKIN BIOPSY Left 05/17/2021   actinic keratosis   SPINAL CORD STIMULATOR BATTERY EXCHANGE N/A 10/11/2021  Procedure: SPINAL CORD STIMULATOR BATTERY EXCHANGE;  Surgeon: Venita Lick, MD;  Location: [facility];  Service: Orthopedics;  Laterality: N/A;  60 mins   SPINAL CORD STIMULATOR INSERTION N/A 11/21/2016   Procedure: LUMBAR SPINAL CORD STIMULATOR INSERTION;  Surgeon: Venita Lick, MD;  Location: [facility];  Service: Orthopedics;  Laterality: N/A;  Requests 2.5 hrs   TONSILLECTOMY     1953   UPPER GI ENDOSCOPY  [id number]   VASECTOMY     1980   Patient Active Problem List   Diagnosis Date Noted   OSA (obstructive sleep apnea) 11/12/2022   Parkinson's disease 11/12/2022   Peripheral vascular disease (HCC) 12/25/2021   Dizziness 12/24/2021   Leg pain, left 11/14/2021   Chronic low back pain with sciatica 11/14/2021   Spinal cord stimulator status 11/14/2021   Muscle spasm 11/14/2021   Constipation 09/11/2021   Slow transit constipation 09/11/2021   Cough 09/11/2021   Early satiety 09/11/2021   Hoarseness 09/11/2021   Iron deficiency  09/11/2021   Personal history of colonic polyps 09/11/2021   Weight decreased 09/11/2021   Esophageal motility disorder 09/11/2021   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   Pain in both feet 08/10/2021   Atherosclerosis of aorta (HCC) 08/30/2020   Mild concentric left ventricular hypertrophy (LVH)    Hypogonadism male    History of kidney stones    Erectile dysfunction    COPD (chronic obstructive pulmonary disease) (HCC)    Acquired trigger finger of left middle finger 04/22/2019   Acquired trigger finger of right middle finger 04/22/2019   Left ventricular dysfunction 10/12/2018   Ankylosing spondylitis (HCC) 10/09/2018   Presbycusis of both ears 04/23/2018   Mild intermittent asthma without complication 04/23/2018   Chronic pain 11/21/2016   Arthritis 10/15/2016   Spondylolisthesis of lumbar region 02/28/2015   Hyperlipidemia    Lumbar disc disease    Hypertension    CAD (coronary artery disease) 05/19/2012   GERD (gastroesophageal reflux disease)    Depression with anxiety    Chronic asthma 05/17/2011   Allergic rhinitis due to pollen 08/08/2010   History of benign positional vertigo    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 Does not know why it is hurting. [name] states pt fell yesterday, tripped over a stack of cardboard. [name] helped pt up. Denies injuries.   Pt accompanied by:  Wife, [name]  PERTINENT HISTORY:  Pt falls frequently, always in forward direction and insists on using back stairs despite frequent falls over top step. Started Sinemet week of 11/04/22  PAIN:  Are you having pain? No  There were no vitals filed for this visit.   PRECAUTIONS: Fall  WEIGHT BEARING RESTRICTIONS: No  FALLS: Has patient fallen in last 6 months? Yes. Number of falls "more than I can count"  LIVING ENVIRONMENT: Lives with: lives with their spouse Lives in: House/apartment Stairs: Yes: External: 3 in front and 5 in back steps; on right going up, on left going up, can reach both, and always uses back steps (where he has the most falls) Has following equipment at home: Single point cane  PLOF: Independent  PATIENT GOALS: "I want to be in the next Olympics" "I would like to walk with minimal pain and get up and down with no assistance"  OBJECTIVE:   COGNITION: Overall cognitive status: Impaired   SENSATION: Denies numbness/tingling in BUE/BLEs  COORDINATION: Heel to shin test: WNL bilaterally   EDEMA: Frequent swelling in R ankle    POSTURE: rounded shoulders, forward head, and left pelvic obliquity  LOWER EXTREMITY MMT:  Tested in seated position   MMT Right Eval Left Eval  Hip flexion 4+ 5  Hip extension    Hip abduction 4 4  Hip adduction 5 5  Hip internal rotation    Hip external rotation    Knee flexion 5 5  Knee extension 5 5  Ankle dorsiflexion 3 5  Ankle plantarflexion    Ankle inversion    Ankle eversion    (Blank rows = not tested)   TODAY'S TREATMENT:    NMR  At ballet bar w/various colored cones placed in semi-circle around pt, placed list of colors typed in differing colored font to work on go/no-go. Instructed pt to tap the cone that matches the color that the word is typed in, not the word itself. CGA-min A for steadying assist, w/decreased stability when tapping w/RLE. Pt very challenged by this, requesting to hear the instructions 8x as pt could not remember task.  Pt very distractible this date. Attempted to cue pt using paint colors, as this resonates w/pt, but pt unable to follow cues. By final 5 words (out of 25), pt able to perform w/80% accuracy.   Ther Act Discussed rationale for go/ no-go task and its relevance to freezing. Pt became very emotional with discussion and displayed lability throughout remainder of session, speaking about his mother and his kids. Provided therapeutic listening and encouragement as appropriate. Encouraged [name] to monitor pt and if his cognition continues to decline, to contact Dr. Arbutus Leas or have pt screened for UTI. Pt and wife verbalized understanding.     Gait pattern: step through pattern, decreased step length- Right, decreased stride length, decreased hip/knee flexion- Right, decreased ankle dorsiflexion- Right, shuffling, decreased trunk rotation, trunk flexed, and poor foot clearance- Right Distance walked: Various clinic distances   Assistive device utilized: Walker - 2 wheeled Level of assistance: SBA Comments: Pt needing cues for staying closer to RW, heel strike, and tall posture. Increased forward flexed posture this date    PATIENT EDUCATION: Education details: See ther Act  Person  educated: Patient and Spouse Education method: Explanation, Demonstration, Verbal cues, and Handouts Education comprehension: verbalized understanding, returned demonstration, verbal cues required, and needs further education  HOME EXERCISE PROGRAM: Walking program, starting with 3-4 minutes daily around the house with focus on tall posture, stride length, and staying close to RW  Seated PWR Up/Step  Standing PWR Rock/Step Supine PWR Up  Access Code: [email or web address] URL: [email or web address] Date: 01/07/2023 Prepared by: Sherlie Ban  Exercises - Sit to Stand Without Arm Support  - 1 x daily - 7 x weekly - 3 sets - 10 reps - Supine Bridge  - 1 x daily - 7 x weekly - 1-2 sets - 10 reps - Modified Thomas  Stretch  - 1 x daily - 7 x weekly - 3-5 minute hold  GOALS: Goals reviewed with patient? Yes  LONG TERM GOALS: Target date: 01/10/2023  Pt will be independent with final HEP for improved strength, balance, transfers and gait.  Baseline: pt reports not doing them as much as he should, reports he needs to do more  Goal status: IN PROGRESS  2.  Pt will improve gait velocity to at least 2.6 ft/s w/LRAD and SBA for improved gait efficiency and independence   Baseline: 1.95 ft/s w/o AD and min A  17.53 seconds = 1.87 ft/sec with RW   Goal status: NOT MET  3.  Pt will improve TUG time to 15 seconds or less with LRAD and supervision  in order to demo decrease fall risk. Baseline: 20 seconds with no AD, CGA  11 seconds with no AD with supervision/CGA  Goal status: MET  4.  Pt will improve 5 x STS to less than or equal to 15 seconds w/good posture and form to demonstrate improved functional strength and transfer efficiency.   Baseline: 15.41s w/BUE support, significant posterior lean and flexed posture   14.1 seconds with no UE support on 01/09/23 with no episodes of retropulsion  Goal status: MET  5.  Pt will trial various bracing options on RLE to reduce foot slap and fall frequency.  Baseline: bracing options not trialed, pt using a RW to decr fall risk.  Goal status: N/A    UPDATED/ONGOING LTGS FOR RE-CERT  LONG TERM GOALS: Target date: 02/10/2023  Pt will be independent with final HEP for improved strength, balance, transfers and gait.  Baseline: pt reports not doing them as much as he should, reports he needs to do more  Goal status: IN PROGRESS  2.  Pt will perform 10 reps of sit <> stands with no UE support and no retropulsion in order to demo improved transfer efficiency.   Baseline: Goal status: INITIAL  3.  Pt will perform car transfer with supervision in order to demo improved functional mobility. Baseline: pt reports difficulty getting in and out of the car Goal  status: INITIAL  4.  Pt will perform floor transfer with CGA for fall recovery strategies  Baseline:  Goal status: INITIAL  5.  Pt will improve with RW to at least 720' with RPE as a 3/10 or less  in order to demo improved gait efficiency/endurance Baseline: 685' with 5/10 RPE Goal status: INTIIAL   ASSESSMENT:  CLINICAL IMPRESSION: Session limited due to pt's late arrival and emotional lability. Emphasis of skilled PT session on go/no-go and pt education on its relevance to gait. Pt very challenged by colors task, requiring instructions to be repeated 8x and requiring max concurrent cues to remember task. Pt very distractible this date  as well as emotional, crying on and off at end of session in between laughs and stories of his family. Verbalized concerns for recent decline in cognition and encouraged pt and wife to contact Dr. Arbutus Leas or PCP to be screened for UTI, especially if cognitive changes continue. Continue POC.    OBJECTIVE IMPAIRMENTS: Abnormal gait, decreased balance, decreased cognition, decreased coordination, decreased knowledge of condition, decreased knowledge of use of DME, decreased mobility, difficulty walking, decreased strength, decreased safety awareness, impaired perceived functional ability, improper body mechanics, postural dysfunction, and pain  ACTIVITY LIMITATIONS: carrying, lifting, bending, standing, squatting, stairs, transfers, bed mobility, reach over head, locomotion level, and caring for others  PARTICIPATION LIMITATIONS: medication management, interpersonal relationship, driving, shopping, community activity, and yard work  PERSONAL FACTORS: Age, Behavior pattern, Fitness, Past/current experiences, and 1 comorbidity: Vascular parkinsonism  are also affecting patient's functional outcome.   REHAB POTENTIAL: Fair due to diagnosis   CLINICAL DECISION MAKING: Evolving/moderate complexity  EVALUATION COMPLEXITY: Moderate  PLAN:  PT FREQUENCY:  2x/week  PT DURATION: 8 weeks  PLANNED INTERVENTIONS: Therapeutic exercises, Therapeutic activity, Neuromuscular re-education, Balance training, Gait training, Patient/Family education, Self Care, Joint mobilization, Stair training, Vestibular training, Canalith repositioning, Orthotic/Fit training, DME instructions, Manual therapy, and Re-evaluation  PLAN FOR NEXT SESSION:  20th visit PN. Continue to work on balance. Work on car transfers as pt reports that this was challenging. Review freezing/turns. RLE foot clearance tasks. practice gait with RW. trial braces on RLE. Floor transfer.    Jill Alexanders Amiley Shishido, PT, DPT 01/23/2023, 1:20 PM

## 2023-01-28 ENCOUNTER — Ambulatory Visit: Payer: Medicare Other | Admitting: Physical Therapy

## 2023-01-28 ENCOUNTER — Ambulatory Visit: Payer: Medicare Other | Admitting: Speech Pathology

## 2023-01-28 DIAGNOSIS — R2681 Unsteadiness on feet: Secondary | ICD-10-CM | POA: Diagnosis not present

## 2023-01-28 DIAGNOSIS — R2689 Other abnormalities of gait and mobility: Secondary | ICD-10-CM

## 2023-01-28 DIAGNOSIS — R293 Abnormal posture: Secondary | ICD-10-CM

## 2023-01-28 DIAGNOSIS — R471 Dysarthria and anarthria: Secondary | ICD-10-CM

## 2023-01-28 NOTE — Therapy (Signed)
OUTPATIENT PHYSICAL THERAPY NEURO TREATMENT- 20TH VISIT PROGRESS NOTE   Patient Name: [name] MRN: [id number] DOB:May 02, 1946, 77 y.o., male Today's Date: 01/28/2023   PCP: Ronnald Nian, MD  REFERRING PROVIDER: Vladimir Faster, DO  Physical Therapy Progress Note   Dates of Reporting Period:11/15/22 - 01/28/23  See Note below for Objective Data and Assessment of Progress/Goals.    END OF SESSION:  PT End of Session - 01/28/23 1318     Visit Number 20    Number of Visits 30   Plus eval   Date for PT Re-Evaluation 03/10/23    Authorization Type [employer]    Progress Note Due on Visit 10    PT Start Time 1318    PT Stop Time 1400    PT Time Calculation (min) 42 min    Equipment Utilized During Treatment Gait belt    Activity Tolerance Patient tolerated treatment well    Behavior During Therapy WFL for tasks assessed/performed                   Past Medical History:  Diagnosis Date   ADD (attention deficit disorder)    Allergic rhinitis    skin test 02/23/08 vaccine 2009; allergy shots weekly through Dr. Maple Hudson   Anemia    takes Fe -    Anginal pain (HCC)    Ankylosing spondylitis (HCC)    Anxiety    sees therapist Vernell Leep   Arthritis    Asthma    Dr. 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 Donnie Aho   CHEILECTOMY  10/16/2011   Procedure: CHEILECTOMY;  Surgeon: Sherri Rad, MD;  Location: [facility];  Service: Orthopedics;  Laterality: Right;   CHOLECYSTECTOMY     COLONOSCOPY     CORONARY ANGIOPLASTY     1995 after MI   CORONARY ANGIOPLASTY WITH STENT PLACEMENT     1999    ESOPHAGOGASTRODUODENOSCOPY  02/18/2012   Procedure: ESOPHAGOGASTRODUODENOSCOPY (EGD);  Surgeon: Florencia Reasons, MD;  Location: [facility];  Service: Endoscopy;  Laterality: N/A;   FOOT SURGERY     joint scraped, arthritis right foot   HERNIA REPAIR     LUMBAR DISC SURGERY     x2   right index finger  mass removed- arthritis     Dr. Amanda Pea   [facility]  Right 05/18/2021  medial toe consistent with callus   SKIN BIOPSY Left 05/17/2021   actinic keratosis   SPINAL CORD STIMULATOR BATTERY EXCHANGE N/A 10/11/2021   Procedure: SPINAL CORD STIMULATOR BATTERY EXCHANGE;  Surgeon: Venita Lick, MD;  Location: [facility];  Service: Orthopedics;  Laterality: N/A;  60 mins   SPINAL CORD STIMULATOR INSERTION N/A 11/21/2016   Procedure: LUMBAR SPINAL CORD STIMULATOR INSERTION;  Surgeon: Venita Lick, MD;  Location: [facility];  Service: Orthopedics;  Laterality: N/A;  Requests 2.5 hrs   TONSILLECTOMY     1953   UPPER GI ENDOSCOPY  [id number]   VASECTOMY     1980   Patient Active Problem List   Diagnosis Date Noted   OSA (obstructive sleep apnea) 11/12/2022   Parkinson's disease 11/12/2022   Peripheral vascular disease (HCC) 12/25/2021   Dizziness 12/24/2021   Leg pain, left 11/14/2021   Chronic low back pain with sciatica 11/14/2021   Spinal cord stimulator status 11/14/2021   Muscle spasm 11/14/2021    Constipation 09/11/2021   Slow transit constipation 09/11/2021   Cough 09/11/2021   Early satiety 09/11/2021   Hoarseness 09/11/2021   Iron deficiency 09/11/2021   Personal history of colonic polyps 09/11/2021   Weight decreased 09/11/2021   Esophageal motility disorder 09/11/2021   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   Pain in both feet 08/10/2021   Atherosclerosis of aorta (HCC) 08/30/2020   Mild concentric left ventricular hypertrophy (LVH)    Hypogonadism male    History of kidney stones    Erectile dysfunction    COPD (chronic obstructive pulmonary disease) (HCC)    Acquired trigger finger of left middle finger 04/22/2019   Acquired trigger finger of right middle finger 04/22/2019   Left ventricular dysfunction 10/12/2018   Ankylosing spondylitis (HCC) 10/09/2018   Presbycusis of both ears 04/23/2018   Mild intermittent asthma without complication 04/23/2018   Chronic pain 11/21/2016   Arthritis 10/15/2016   Spondylolisthesis of lumbar region 02/28/2015   Hyperlipidemia    Lumbar disc disease    Hypertension    CAD (coronary artery disease) 05/19/2012   GERD (gastroesophageal reflux disease)    Depression with anxiety    Chronic asthma 05/17/2011   Allergic rhinitis due to pollen 08/08/2010   History of benign positional vertigo    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 Had a urinalysis done, came back clear. No new falls since  last visit. Sees Dr. Ethelene Hal tomorrow to receive injections to his back.   Pt accompanied by:  Wife, [name]  PERTINENT HISTORY: Pt falls frequently, always in forward direction and insists on using back stairs despite frequent falls over top step. Started Sinemet week of 11/04/22  PAIN:  Are you having pain? No  There were no vitals filed for this visit.   PRECAUTIONS: Fall  WEIGHT BEARING RESTRICTIONS: No  FALLS: Has patient fallen in last 6 months? Yes. Number of falls "more than I can count"  LIVING ENVIRONMENT: Lives with: lives with their spouse Lives in: House/apartment Stairs: Yes: External: 3 in front and 5 in back steps; on right going up, on left going up, can reach both, and always uses back steps (where he has the most falls) Has following equipment at home: Single point cane  PLOF: Independent  PATIENT GOALS: "I want to be in the next Olympics" "I would like to walk with minimal pain and get up and down with no assistance"  OBJECTIVE:   COGNITION: Overall cognitive status: Impaired   SENSATION: Denies numbness/tingling in BUE/BLEs  COORDINATION: Heel to shin test: WNL bilaterally   EDEMA: Frequent swelling in R ankle    POSTURE: rounded shoulders, forward head, and left pelvic obliquity  LOWER EXTREMITY MMT:  Tested in seated position   MMT Right Eval Left Eval  Hip flexion 4+ 5  Hip extension    Hip abduction 4 4  Hip adduction 5 5  Hip internal rotation    Hip external rotation    Knee flexion 5 5  Knee extension 5 5  Ankle dorsiflexion 3 5  Ankle plantarflexion    Ankle inversion    Ankle eversion    (Blank rows = not tested)   TODAY'S TREATMENT:    Ther Ex  SciFit multi-peaks level 7 for 8 minutes using BUE/BLEs for neural priming for reciprocal movement, dynamic cardiovascular warmup and increased amplitude of stepping. RPE of 6/10 following activity   NMR  In // bars for improved stepping strategies, anterior weight shift, BLE  strength and step clearance:  Alt retro stepping off 6" step w/BUE support, x10 per side. Mod verbal cues for proper sequencing and to sustain attention to task. Min A initially due to posterior LOB and moving too quickly. Mod cues to slow down. Noted significant forward flexed posture throughout, as pt will lose balance posteriorly if standing straight  Progressed to adding retro resistance via [name] pulling pt backwards around pelvis w/green resistance band, x12 reps. Pt required min A-CGA throughout due to posterior LOB, but once focused on task, was able to stabilize independently  Side stepping over four 4" hurdles w/BUE support and progressing to no UE support, x4 reps. Pt initially moving way too quickly and frequently knocked hurdles over w/LLE >RLE. Mod verbal cues to SLOW DOWN and pay attention to where he was placing his foot and pt able to perform without errors x1 rep  Progressed to performing when standing on blue balance beam for added challenge w/posture and anticipatory balance, x3 reps. Pt continued to require mod cues to slow down and focus on proper foot placement. Min A for retropulsion correction. Pt required BUE support throughout.  With [name] in [place], had pt push Stedy 115' fwd and pull Stedy 115' retro for improved anterior weight shift, step  clearance, retro stepping and postural control. Pt required min A throughout to steer Stedy (drifting to L side) when pushing fwd and for retropulsion correction when pulling backwards. Pt frequently losing balance posteriorly due to leaning too far backward and losing control over his stepping.    Gait pattern: step through pattern, decreased step length- Right, decreased stride length, decreased hip/knee flexion- Right, decreased ankle dorsiflexion- Right, shuffling, decreased trunk rotation, trunk flexed, and poor foot clearance- Right Distance walked: Various clinic distances   Assistive device utilized: Walker - 2 wheeled Level of  assistance: SBA Comments: Pt needing cues for staying closer to RW, heel strike, and tall posture. Increased forward flexed posture this date. Min cues to stay inside walker at all times, especially w/turns.    PATIENT EDUCATION: Education details: Contacting Dr. Arbutus Leas regarding earlier appointment time, continue HEP  Person educated: Patient and Spouse Education method: Explanation, Demonstration, and Verbal cues Education comprehension: verbalized understanding, returned demonstration, verbal cues required, and needs further education  HOME EXERCISE PROGRAM: Walking program, starting with 3-4 minutes daily around the house with focus on tall posture, stride length, and staying close to RW  Seated PWR Up/Step  Standing PWR Rock/Step Supine PWR Up  Access Code: [email or web address] URL: [email or web address] Date: 01/07/2023 Prepared by: Sherlie Ban  Exercises - Sit to Stand Without Arm Support  - 1 x daily - 7 x weekly - 3 sets - 10 reps - Supine Bridge  - 1 x daily - 7 x weekly - 1-2 sets - 10 reps - Modified Thomas Stretch  - 1 x daily - 7 x weekly - 3-5 minute hold  GOALS: Goals reviewed with patient? Yes  LONG TERM GOALS: Target date: 01/10/2023  Pt will be independent with final HEP for improved strength, balance, transfers and gait.  Baseline: pt reports not doing them as much as he should, reports he needs to do more  Goal status: IN PROGRESS  2.  Pt will improve gait velocity to at least 2.6 ft/s w/LRAD and SBA for improved gait efficiency and independence   Baseline: 1.95 ft/s w/o AD and min A  17.53 seconds = 1.87 ft/sec with RW   Goal status: NOT MET  3.  Pt will improve TUG time to 15 seconds or less with LRAD and supervision  in order to demo decrease fall risk. Baseline: 20 seconds with no AD, CGA  11 seconds with no AD with supervision/CGA  Goal status: MET  4.  Pt will improve 5 x STS to less than or equal to 15 seconds w/good posture and form to  demonstrate improved functional strength and transfer efficiency.   Baseline: 15.41s w/BUE support, significant posterior lean and flexed posture   14.1 seconds with no UE support on 01/09/23 with no episodes of retropulsion  Goal status: MET  5.  Pt will trial various bracing options on RLE to reduce foot slap and fall frequency.  Baseline: bracing options not trialed, pt using a RW to decr fall risk.  Goal status: N/A    UPDATED/ONGOING LTGS FOR RE-CERT  LONG TERM GOALS: Target date: 02/10/2023  Pt will be independent with final HEP for improved strength, balance, transfers and gait.  Baseline: pt reports not doing them as much as he should, reports he needs to do more  Goal status: IN PROGRESS  2.  Pt will perform 10 reps of sit <> stands with no UE support and no retropulsion in order to demo improved transfer efficiency.   Baseline: Goal  status: INITIAL  3.  Pt will perform car transfer with supervision in order to demo improved functional mobility. Baseline: pt reports difficulty getting in and out of the car Goal status: INITIAL  4.  Pt will perform floor transfer with CGA for fall recovery strategies  Baseline:  Goal status: INITIAL  5.  Pt will improve with RW to at least 720' with RPE as a 3/10 or less  in order to demo improved gait efficiency/endurance Baseline: 685' with 5/10 RPE Goal status: INTIIAL   ASSESSMENT:  CLINICAL IMPRESSION: Emphasis of skilled PT session on BLE strength, retro stepping, postural control and increased step clearance. Pt reports he does not have a UTI, but encouraged pt and wife to contact Dr. Arbutus Leas to request earlier appointment due to pt's decline in cognition. Pt requires min-mod verbal cues throughout session to sustain attention to task and was more impulsive this date, requiring close supervision to ensure pt did not lose his balance. Pt unable to sequence tasks well despite max cues, which is a notable change since starting  therapy. Continue POC.     OBJECTIVE IMPAIRMENTS: Abnormal gait, decreased balance, decreased cognition, decreased coordination, decreased knowledge of condition, decreased knowledge of use of DME, decreased mobility, difficulty walking, decreased strength, decreased safety awareness, impaired perceived functional ability, improper body mechanics, postural dysfunction, and pain  ACTIVITY LIMITATIONS: carrying, lifting, bending, standing, squatting, stairs, transfers, bed mobility, reach over head, locomotion level, and caring for others  PARTICIPATION LIMITATIONS: medication management, interpersonal relationship, driving, shopping, community activity, and yard work  PERSONAL FACTORS: Age, Behavior pattern, Fitness, Past/current experiences, and 1 comorbidity: Vascular parkinsonism  are also affecting patient's functional outcome.   REHAB POTENTIAL: Fair due to diagnosis   CLINICAL DECISION MAKING: Evolving/moderate complexity  EVALUATION COMPLEXITY: Moderate  PLAN:  PT FREQUENCY: 2x/week  PT DURATION: 8 weeks  PLANNED INTERVENTIONS: Therapeutic exercises, Therapeutic activity, Neuromuscular re-education, Balance training, Gait training, Patient/Family education, Self Care, Joint mobilization, Stair training, Vestibular training, Canalith repositioning, Orthotic/Fit training, DME instructions, Manual therapy, and Re-evaluation  PLAN FOR NEXT SESSION:  Did they call Dr. Arbutus Leas? Continue to work on balance. Work on car transfers as pt reports that this was challenging. Review freezing/turns. RLE foot clearance tasks. practice gait with RW. trial braces on RLE. Floor transfer.    Jill Alexanders Brieanne Mignone, PT, DPT 01/28/2023, 2:02 PM

## 2023-01-28 NOTE — Therapy (Signed)
OUTPATIENT SPEECH LANGUAGE PATHOLOGY [facility] (PROGRESS NOTE)   Patient Name: [name] MRN: [id number] DOB:1945-08-29, 77 y.o., male Today's Date: 01/28/2023  PCP: Ronnald Nian, MD REFERRING PROVIDER: Vladimir Faster, DO  END OF SESSION:  End of Session - 01/28/23 1232     Visit Number 11    Number of Visits 13    Date for SLP Re-Evaluation 02/07/23   recert   Authorization Type [employer]    Progress Note Due on Visit 10    SLP Start Time 1232    SLP Stop Time  1315    SLP Time Calculation (min) 43 min    Activity Tolerance Patient tolerated treatment well              Past Medical History:  Diagnosis Date   ADD (attention deficit disorder)    Allergic rhinitis    skin test 02/23/08 vaccine 2009; allergy shots weekly through Dr. Maple Hudson   Anemia    takes Fe -    Anginal pain (HCC)    Ankylosing spondylitis (HCC)    Anxiety    sees therapist Vernell Leep   Arthritis    Asthma    Dr. 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 Donnie Aho   CHEILECTOMY  10/16/2011   Procedure: CHEILECTOMY;  Surgeon: Sherri Rad, MD;  Location: [facility];  Service: Orthopedics;  Laterality: Right;   CHOLECYSTECTOMY     COLONOSCOPY     CORONARY ANGIOPLASTY     1995 after MI   CORONARY ANGIOPLASTY WITH STENT PLACEMENT     1999    ESOPHAGOGASTRODUODENOSCOPY  02/18/2012   Procedure: ESOPHAGOGASTRODUODENOSCOPY (EGD);  Surgeon: Florencia Reasons, MD;  Location: [facility];  Service: Endoscopy;  Laterality: N/A;   FOOT SURGERY     joint scraped, arthritis right foot   HERNIA REPAIR     LUMBAR DISC SURGERY     x2   right index finger  mass removed- arthritis     Dr. Amanda Pea   [facility]  Right 05/18/2021   medial toe consistent with callus   SKIN BIOPSY Left 05/17/2021   actinic keratosis   SPINAL CORD STIMULATOR BATTERY EXCHANGE N/A 10/11/2021   Procedure: SPINAL CORD STIMULATOR BATTERY EXCHANGE;  Surgeon: Venita Lick, MD;  Location: [facility];  Service: Orthopedics;  Laterality: N/A;  60 mins   SPINAL CORD STIMULATOR  INSERTION N/A 11/21/2016   Procedure: LUMBAR SPINAL CORD STIMULATOR INSERTION;  Surgeon: Venita Lick, MD;  Location: [facility];  Service: Orthopedics;  Laterality: N/A;  Requests 2.5 hrs   TONSILLECTOMY     1953   UPPER GI ENDOSCOPY  [id number]   VASECTOMY     1980   Patient Active Problem List   Diagnosis Date Noted   OSA (obstructive sleep apnea) 11/12/2022   Parkinson's disease 11/12/2022   Peripheral vascular disease (HCC) 12/25/2021   Dizziness 12/24/2021   Leg pain, left 11/14/2021   Chronic low back pain with sciatica 11/14/2021   Spinal cord stimulator status 11/14/2021   Muscle spasm 11/14/2021   Constipation 09/11/2021   Slow transit constipation 09/11/2021   Cough 09/11/2021   Early satiety 09/11/2021   Hoarseness 09/11/2021   Iron deficiency 09/11/2021   Personal history of colonic polyps 09/11/2021   Weight decreased 09/11/2021   Esophageal motility  disorder 09/11/2021   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   Pain in both feet 08/10/2021   Atherosclerosis of aorta (HCC) 08/30/2020   Mild concentric left ventricular hypertrophy (LVH)    Hypogonadism male    History of kidney stones    Erectile dysfunction    COPD (chronic obstructive pulmonary disease) (HCC)    Acquired trigger finger of left middle finger 04/22/2019   Acquired trigger finger of right middle finger 04/22/2019   Left ventricular dysfunction 10/12/2018   Ankylosing spondylitis (HCC) 10/09/2018   Presbycusis of both ears 04/23/2018   Mild intermittent asthma without complication 04/23/2018   Chronic pain 11/21/2016   Arthritis 10/15/2016   Spondylolisthesis of lumbar region 02/28/2015   Hyperlipidemia    Lumbar disc disease    Hypertension    CAD (coronary artery disease) 05/19/2012   GERD (gastroesophageal reflux disease)    Depression with anxiety    Chronic asthma 05/17/2011   Allergic rhinitis due to pollen 08/08/2010   History of benign positional vertigo    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 about 4 days/week, ongoing c/o hoarseness.   PERTINENT HISTORY:PT with wife who supplements hx.  Patient was in the emergency room on April 1 with mental status change.  Patient had apparently had a URI and had been taking Mucinex and had some degree of confusion with the URI/medication.  Patient had gotten in the shower and his wife went into check on him 30 minutes later and found that he had fallen.  There was apparently no syncope and patient did not hit his head.  Patient went to the hospital and was found to be hypokalemic at 2.3.  Patient  did not have MRI because of spinal cord stimulator.  Patient followed up with primary care physician, at which point they noted that patient had been having a chronic change in gait with forward leaning trunk and gait for several months (perhaps longer per pt/wife)   PAIN:  Are you having pain? No  PATIENT GOALS: "get back to singing"  OBJECTIVE:   TODAY'S TREATMENT:  01/28/23: Re-education provided re: need for use of intentional speech to address hoarseness and volume decay. Addressed dysarthria via Speak Out lesson 4. Pt able to achieve clear voicing with increased intensity during warm up and reading exercises. Pt required usual mod-A to carryover strategies.   01/15/23: Forgot Speak Out! Book and couldn't recall current lesson. Per pt report, it appears self-consciousness and perception of over-loudness are barriers to completing recommended HEP. Reviewed SOVT exercises with intermittent mod A required to optimize effort and accuracy. Has not been completing at home. Targeted conversational speech with SLP providing intermittent cues to project voice when intensity decayed and hoarseness evident. Occasional self-corrections exhibited but overall inconsistent awareness. Re-educated importance of completing HEP to maximize carryover.    01/09/23: Reviewed recommendation for vocal warm up exercises to reduce hoarseness when participating in choir practice. Led pt through semi occluded vocal tract exercises with use of water/straw to provide biofeedback for flow of exhalation. Pt with teach back of exercise with 80% accuracy. Target improved vocal quality following straw phonation during singing exercises, with pt demonstrating clear voicing with usual mod-A. Pt presenting with x4 throat clears initial 8 minutes. Does not recall any avoidance strategies. Provided handout,  allowed pt to read. Pt ID "pursed lip breathing" strategy as one he feels he can implement when urge to clear throat occurs. Pt required ongoing cues to ID throat clears and attempt implementation of alternative.    PATIENT EDUCATION: Education details: see above.  Person educated: Patient and Spouse Education method: Medical illustrator Education comprehension: verbalized understanding and returned demonstration  HOME EXERCISE PROGRAM: Speak Out!    GOALS: Goals reviewed with patient? Yes  SHORT TERM GOALS: Target date: 12/27/2022  Pt will complete HEP at least 1x/day given occasional min A over 2 sessions  Baseline: Goal status: Deferred--Pt has not received Speak Out! Book yet.   2.  Pt will meet or exceed dB targets during reading exercises with 90% accuracy with rare min-A.  Baseline:  Goal status: MET  3.  Pt will demonstrate SOVTE and vocal function exercises with mod-I.  Baseline:  Goal status: MET  LONG TERM GOALS: Target date: 02/07/23 (recert)  1.  Pt and spouse will report reduced need to repeat information at home > 1 week.  Baseline: 21.5/32 Goal status: IN PROGRESS  2.  Pt will utilize dysarthria compensations in 5-10 minute conversation and maintain WNL conversational volume (70-72 dB) given occasional min A over 2 sessions   Baseline:  Goal status: IN PROGRESS  3.  Pt will demonstrate intentional voice and flow phonation during singing exercise with min-A  Baseline:  Goal status: IN PROGRESS   4.  Pt will report improvement via PROM by d/c   Baseline: CPIB=21.5  Goal status: IN PROGRESS   ASSESSMENT:  CLINICAL IMPRESSION: Patient is a 77 y.o. M who was seen today for motor speech s/p hoarse voice and decreased volume. Patient was able to increase volume upon initiation of phrases but needed prompting to keep his volume up during long, unstructured conversation. Patient encouraged to use his "presenting" voice to speak up and out with intent.  Pt's speech is c/b intermittent hoarse vocal quality, reduced vocal intensity, volume decay. Conducted ongoing education and training of Speak Out! Program to maximize volume intensity and clarity with intermittent to usual mod A required at this time to achieve targeted dB levels. Pt would benefit from skilled ST to address aforementioned deficits to enhance communication efficacy.    OBJECTIVE IMPAIRMENTS: Objective  impairments include dysarthria. These impairments are limiting patient from effectively communicating at home and in community.Factors affecting potential to achieve goals and functional outcome are co-morbidities and medical prognosis. Patient will benefit from skilled SLP services to address above impairments and improve overall function.  REHAB POTENTIAL: Excellent  PLAN:  SLP FREQUENCY: 1-2x/week  SLP DURATION: 8 weeks   PLANNED INTERVENTIONS: Cueing hierachy, Oral motor exercises, SLP instruction and feedback, Compensatory strategies, and Patient/family education  Maia Breslow, CCC-SLP 01/28/2023, 12:35 PM

## 2023-01-30 ENCOUNTER — Ambulatory Visit: Payer: Medicare Other | Admitting: Physical Therapy

## 2023-01-30 ENCOUNTER — Encounter: Payer: Self-pay | Admitting: Physical Therapy

## 2023-01-30 ENCOUNTER — Ambulatory Visit: Payer: Medicare Other | Admitting: Speech Pathology

## 2023-01-30 DIAGNOSIS — R2681 Unsteadiness on feet: Secondary | ICD-10-CM | POA: Diagnosis not present

## 2023-01-30 DIAGNOSIS — R471 Dysarthria and anarthria: Secondary | ICD-10-CM

## 2023-01-30 DIAGNOSIS — R293 Abnormal posture: Secondary | ICD-10-CM

## 2023-01-30 DIAGNOSIS — R2689 Other abnormalities of gait and mobility: Secondary | ICD-10-CM

## 2023-01-30 DIAGNOSIS — R296 Repeated falls: Secondary | ICD-10-CM

## 2023-01-30 NOTE — Therapy (Signed)
OUTPATIENT SPEECH LANGUAGE PATHOLOGY [facility] (PROGRESS NOTE)   Patient Name: [name] MRN: [id number] DOB:Aug 25, 1945, 77 y.o., male Today's Date: 01/30/2023  PCP: Ronnald Nian, MD REFERRING PROVIDER: Vladimir Faster, DO  END OF SESSION:  End of Session - 01/30/23 1017     Visit Number 12    Number of Visits 13    Date for SLP Re-Evaluation 02/07/23   recert   Authorization Type [employer]    Progress Note Due on Visit 10    SLP Start Time 1017    SLP Stop Time  1100    SLP Time Calculation (min) 43 min    Activity Tolerance Patient tolerated treatment well              Past Medical History:  Diagnosis Date   ADD (attention deficit disorder)    Allergic rhinitis    skin test 02/23/08 vaccine 2009; allergy shots weekly through Dr. Maple Hudson   Anemia    takes Fe -    Anginal pain (HCC)    Ankylosing spondylitis (HCC)    Anxiety    sees therapist Vernell Leep   Arthritis    Asthma    Dr. 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 Donnie Aho   CHEILECTOMY  10/16/2011   Procedure: CHEILECTOMY;  Surgeon: Sherri Rad, MD;  Location: [facility];  Service: Orthopedics;  Laterality: Right;   CHOLECYSTECTOMY     COLONOSCOPY     CORONARY ANGIOPLASTY     1995 after MI   CORONARY ANGIOPLASTY WITH STENT PLACEMENT     1999    ESOPHAGOGASTRODUODENOSCOPY  02/18/2012   Procedure: ESOPHAGOGASTRODUODENOSCOPY (EGD);  Surgeon: Florencia Reasons, MD;  Location: [facility];  Service: Endoscopy;  Laterality: N/A;   FOOT SURGERY     joint scraped, arthritis right foot   HERNIA REPAIR     LUMBAR DISC SURGERY     x2   right index finger  mass removed- arthritis     Dr. Amanda Pea   [facility]  Right 05/18/2021   medial toe consistent with callus   SKIN BIOPSY Left 05/17/2021   actinic keratosis   SPINAL CORD STIMULATOR BATTERY EXCHANGE N/A 10/11/2021   Procedure: SPINAL CORD STIMULATOR BATTERY EXCHANGE;  Surgeon: Venita Lick, MD;  Location: [facility];  Service: Orthopedics;  Laterality: N/A;  60 mins   SPINAL CORD STIMULATOR  INSERTION N/A 11/21/2016   Procedure: LUMBAR SPINAL CORD STIMULATOR INSERTION;  Surgeon: Venita Lick, MD;  Location: [facility];  Service: Orthopedics;  Laterality: N/A;  Requests 2.5 hrs   TONSILLECTOMY     1953   UPPER GI ENDOSCOPY  [id number]   VASECTOMY     1980   Patient Active Problem List   Diagnosis Date Noted   OSA (obstructive sleep apnea) 11/12/2022   Parkinson's disease 11/12/2022   Peripheral vascular disease (HCC) 12/25/2021   Dizziness 12/24/2021   Leg pain, left 11/14/2021   Chronic low back pain with sciatica 11/14/2021   Spinal cord stimulator status 11/14/2021   Muscle spasm 11/14/2021   Constipation 09/11/2021   Slow transit constipation 09/11/2021   Cough 09/11/2021   Early satiety 09/11/2021   Hoarseness 09/11/2021   Iron deficiency 09/11/2021   Personal history of colonic polyps 09/11/2021   Weight decreased 09/11/2021   Esophageal motility  disorder 09/11/2021   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   Pain in both feet 08/10/2021   Atherosclerosis of aorta (HCC) 08/30/2020   Mild concentric left ventricular hypertrophy (LVH)    Hypogonadism male    History of kidney stones    Erectile dysfunction    COPD (chronic obstructive pulmonary disease) (HCC)    Acquired trigger finger of left middle finger 04/22/2019   Acquired trigger finger of right middle finger 04/22/2019   Left ventricular dysfunction 10/12/2018   Ankylosing spondylitis (HCC) 10/09/2018   Presbycusis of both ears 04/23/2018   Mild intermittent asthma without complication 04/23/2018   Chronic pain 11/21/2016   Arthritis 10/15/2016   Spondylolisthesis of lumbar region 02/28/2015   Hyperlipidemia    Lumbar disc disease    Hypertension    CAD (coronary artery disease) 05/19/2012   GERD (gastroesophageal reflux disease)    Depression with anxiety    Chronic asthma 05/17/2011   Allergic rhinitis due to pollen 08/08/2010   History of benign positional vertigo    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  Patient was in the emergency room on April 1 with mental status change.  Patient had apparently had a URI and had been taking Mucinex and had some degree of confusion with the URI/medication.  Patient had gotten in the shower and his wife went into check on him 30 minutes later and found that he had fallen.  There was apparently no syncope and patient did not hit his head.  Patient went to the hospital and was found to be hypokalemic at 2.3.  Patient did not have MRI because of spinal cord stimulator.   Patient followed up with primary care physician, at which point they noted that patient had been having a chronic change in gait with forward leaning trunk and gait for several months (perhaps longer per pt/wife)   PAIN:  Are you having pain? No  PATIENT GOALS: "get back to singing"  OBJECTIVE:   TODAY'S TREATMENT:  01/30/23: SLP led pt through semi-occluded vocal tract exercises to warm up voice. Pt demonstrates occasional decreased clarity in tone, coinciding with reduced amplitude of bubbles during exercise. Education provided re: use of bubbles as biofeedback. Determined that use of resonant hum facilitative return to forward resonance and air flow. Led pt through singing exercises with pt selected songs. Usual verbal cues for use of intentional voice and using appropriate breath support to decreased instance of hoarseness.   01/28/23: Re-education provided re: need for use of intentional speech to address hoarseness and volume decay. Addressed dysarthria via Speak Out lesson 4. Pt able to achieve clear voicing with increased intensity during warm up and reading exercises. Pt required usual mod-A to carryover strategies.   01/15/23: Forgot Speak Out! Book and couldn't recall current lesson. Per pt report, it appears self-consciousness and perception of over-loudness are barriers to completing recommended HEP. Reviewed SOVT exercises with intermittent mod A required to optimize effort and accuracy. Has not been completing at home. Targeted conversational speech with SLP providing intermittent cues to project voice when intensity decayed and hoarseness evident. Occasional self-corrections exhibited but overall inconsistent awareness. Re-educated importance of completing HEP to maximize carryover.    01/09/23: Reviewed recommendation for vocal warm up exercises to reduce  hoarseness when participating in choir practice. Led pt through semi occluded vocal tract exercises with use of water/straw to provide biofeedback for flow of exhalation. Pt with teach back of exercise with 80% accuracy. Target improved vocal quality following straw phonation during singing exercises, with pt demonstrating clear voicing with usual mod-A. Pt presenting with x4 throat clears initial 8 minutes. Does not recall any avoidance strategies. Provided handout, allowed pt to read. Pt ID "pursed lip breathing" strategy as one he feels he can implement when urge to clear throat occurs. Pt required ongoing cues to ID throat clears and attempt implementation of alternative.    PATIENT EDUCATION: Education details: see above.  Person educated: Patient and Spouse Education method: Medical illustrator Education comprehension: verbalized understanding and returned demonstration  HOME EXERCISE PROGRAM: Speak Out!    GOALS: Goals reviewed with patient? Yes  SHORT TERM GOALS: Target date: 12/27/2022  Pt will complete HEP at least 1x/day given occasional min A over 2 sessions  Baseline: Goal status: Deferred--Pt has not received Speak Out! Book yet.   2.  Pt will meet or exceed dB targets during reading exercises with 90% accuracy with rare min-A.  Baseline:  Goal status: MET  3.  Pt will demonstrate SOVTE and vocal function exercises with mod-I.  Baseline:  Goal status: MET  LONG TERM GOALS: Target date: 02/07/23 (recert)  1.  Pt and spouse will report reduced need to repeat information at home > 1 week.  Baseline: 21.5/32 Goal status: IN PROGRESS  2.  Pt will utilize dysarthria compensations in 5-10 minute conversation and maintain WNL conversational volume (70-72 dB) given occasional min A over 2 sessions   Baseline:  Goal status: IN PROGRESS  3.  Pt will demonstrate intentional voice and flow phonation during singing exercise with min-A  Baseline:  Goal status: IN  PROGRESS   4.  Pt will report improvement via PROM by d/c   Baseline: CPIB=21.5  Goal status: IN PROGRESS   ASSESSMENT:  CLINICAL IMPRESSION: Patient is a 77 y.o. M who was seen today for motor speech s/p hoarse voice and decreased volume. Patient was able to increase volume upon initiation of phrases but needed prompting to keep his volume up during long,  unstructured conversation. Patient encouraged to use his "presenting" voice to speak up and out with intent. Pt's speech is c/b intermittent hoarse vocal quality, reduced vocal intensity, volume decay. Conducted ongoing education and training of Speak Out! Program to maximize volume intensity and clarity with intermittent to usual mod A required at this time to achieve targeted dB levels. Pt would benefit from skilled ST to address aforementioned deficits to enhance communication efficacy.    OBJECTIVE IMPAIRMENTS: Objective impairments include dysarthria. These impairments are limiting patient from effectively communicating at home and in community.Factors affecting potential to achieve goals and functional outcome are co-morbidities and medical prognosis. Patient will benefit from skilled SLP services to address above impairments and improve overall function.  REHAB POTENTIAL: Excellent  PLAN:  SLP FREQUENCY: 1-2x/week  SLP DURATION: 8 weeks   PLANNED INTERVENTIONS: Cueing hierachy, Oral motor exercises, SLP instruction and feedback, Compensatory strategies, and Patient/family education  Maia Breslow, CCC-SLP 01/30/2023, 10:18 AM

## 2023-01-30 NOTE — Therapy (Signed)
OUTPATIENT PHYSICAL THERAPY NEURO TREATMENT Patient Name: [name] MRN: [id number] DOB:September 03, 1945, 77 y.o., male Today's Date: 01/30/2023   PCP: Ronnald Nian, MD  REFERRING PROVIDER: Vladimir Faster, DO    END OF SESSION:  PT End of Session - 01/30/23 1103     Visit Number 21    Number of Visits 30   Plus eval   Date for PT Re-Evaluation 03/10/23    Authorization Type [employer] Medicare    Progress Note Due on Visit 10    PT Start Time 1101    PT Stop Time 1142    PT Time Calculation (min) 41 min    Equipment Utilized During Treatment Gait belt    Activity Tolerance Patient tolerated treatment well    Behavior During Therapy WFL for tasks assessed/performed                    Past Medical History:  Diagnosis Date   ADD (attention deficit disorder)    Allergic rhinitis    skin test 02/23/08 vaccine 2009; allergy shots weekly through Dr. Maple Hudson   Anemia    takes Fe -    Anginal pain (HCC)    Ankylosing spondylitis (HCC)    Anxiety    sees therapist Vernell Leep   Arthritis    Asthma    Dr. 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 Donnie Aho   CHEILECTOMY  10/16/2011   Procedure: CHEILECTOMY;  Surgeon: Sherri Rad, MD;  Location: [facility];  Service: Orthopedics;  Laterality: Right;   CHOLECYSTECTOMY     COLONOSCOPY     CORONARY ANGIOPLASTY     1995 after MI   CORONARY ANGIOPLASTY WITH STENT PLACEMENT     1999    ESOPHAGOGASTRODUODENOSCOPY  02/18/2012   Procedure: ESOPHAGOGASTRODUODENOSCOPY (EGD);  Surgeon: Florencia Reasons, MD;  Location: [facility];  Service: Endoscopy;  Laterality: N/A;   FOOT SURGERY     joint scraped, arthritis right foot   HERNIA REPAIR     LUMBAR DISC SURGERY     x2   right index finger  mass removed- arthritis     Dr. Amanda Pea   [facility]  Right 05/18/2021   medial toe consistent with callus   SKIN BIOPSY Left 05/17/2021   actinic keratosis   SPINAL CORD STIMULATOR BATTERY EXCHANGE N/A 10/11/2021   Procedure: SPINAL CORD  STIMULATOR BATTERY EXCHANGE;  Surgeon: Venita Lick, MD;  Location: [facility];  Service: Orthopedics;  Laterality: N/A;  60 mins   SPINAL CORD STIMULATOR INSERTION N/A 11/21/2016   Procedure: LUMBAR SPINAL CORD STIMULATOR INSERTION;  Surgeon: Venita Lick, MD;  Location: [facility] OR;  Service: Orthopedics;  Laterality: N/A;  Requests 2.5 hrs   TONSILLECTOMY     1953   UPPER GI ENDOSCOPY  [id number]   VASECTOMY     1980   Patient Active Problem List   Diagnosis Date Noted   OSA (obstructive sleep apnea) 11/12/2022   Parkinson's disease 11/12/2022   Peripheral vascular disease (HCC) 12/25/2021   Dizziness 12/24/2021   Leg pain, left 11/14/2021   Chronic low back pain with sciatica 11/14/2021   Spinal cord stimulator status 11/14/2021   Muscle spasm 11/14/2021   Constipation 09/11/2021   Slow transit constipation 09/11/2021   Cough 09/11/2021   Early satiety 09/11/2021   Hoarseness 09/11/2021   Iron deficiency 09/11/2021   Personal  history of colonic polyps 09/11/2021   Weight decreased 09/11/2021   Esophageal motility disorder 09/11/2021   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   Pain in both feet 08/10/2021   Atherosclerosis of aorta (HCC) 08/30/2020   Mild concentric left ventricular hypertrophy (LVH)    Hypogonadism male    History of kidney stones    Erectile dysfunction    COPD (chronic obstructive pulmonary disease) (HCC)    Acquired trigger finger of left middle finger 04/22/2019   Acquired trigger finger of right middle finger 04/22/2019   Left ventricular dysfunction 10/12/2018   Ankylosing spondylitis (HCC) 10/09/2018   Presbycusis of both ears 04/23/2018   Mild intermittent asthma without complication 04/23/2018   Chronic pain 11/21/2016   Arthritis 10/15/2016   Spondylolisthesis of lumbar region 02/28/2015   Hyperlipidemia    Lumbar disc disease    Hypertension    CAD (coronary artery disease) 05/19/2012   GERD (gastroesophageal reflux disease)    Depression with anxiety    Chronic asthma 05/17/2011   Allergic rhinitis due to pollen 08/08/2010   History of benign positional vertigo    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 Haven't had the chance to reach out to Dr. Arbutus Leas regarding an earlier appt. Saw Dr. Ethelene Hal yesterday for a consultation and reports that they want him to get injections.    Pt accompanied by:  Wife, [name]  PERTINENT HISTORY: Pt falls frequently,  always in forward direction and insists on using back stairs despite frequent falls over top step. Started Sinemet week of 11/04/22  PAIN:  Are you having pain? No  There were no vitals filed for this visit.   PRECAUTIONS: Fall  WEIGHT BEARING RESTRICTIONS: No  FALLS: Has patient fallen in last 6 months? Yes. Number of falls "more than I can count"  LIVING ENVIRONMENT: Lives with: lives with their spouse Lives in: House/apartment Stairs: Yes: External: 3 in front and 5 in back steps; on right going up, on left going up, can reach both, and always uses back steps (where he has the most falls) Has following equipment at home: Single point cane  PLOF: Independent  PATIENT GOALS: "I want to be in the next Olympics" "I would like to walk with minimal pain and get up and down with no assistance"  OBJECTIVE:   COGNITION: Overall cognitive status: Impaired   SENSATION: Denies numbness/tingling in BUE/BLEs  COORDINATION: Heel to shin test: WNL bilaterally   EDEMA: Frequent swelling in R ankle    POSTURE: rounded shoulders, forward head, and left pelvic obliquity  LOWER EXTREMITY MMT:  Tested in seated position   MMT Right Eval Left Eval  Hip flexion 4+ 5  Hip extension    Hip abduction 4 4  Hip adduction 5 5  Hip internal rotation    Hip external rotation    Knee flexion 5 5  Knee extension 5 5  Ankle dorsiflexion 3 5  Ankle plantarflexion    Ankle inversion    Ankle eversion    (Blank rows = not tested)   TODAY'S TREATMENT:    Ther Ex  SciFit multi-peaks level 7 for 8 minutes using BUE/BLEs for neural priming for reciprocal movement, dynamic cardiovascular warmup and increased amplitude of stepping. RPE of 6/10 following activity. When pt conversing with therapist on SciFit, pt with decr amplitude of movement with SciFit with cues needed to focus to the task for larger amplitude movements.   NMR   With 6 blaze pods in a line for side stepping for  balance/stepping, visual scanning, weight shifting. Trying to perform without UE support, cues to take side steps vs. Forwards and backwards walking Performed with scanning/distracting with 2 distracting colors for dual tasking, pt only needing to tap red or blue Performed 1 minute with focus on task: 19 hits  Performed 3 bouts of 1 minute with cognitive dual tasking:  15 hits (naming animals) 13 hits (naming foods in alphabetical order, needing one episode of min/mod A for balance as pt moving too quickly), 16 hits (pt with more difficulty thinking about foods with this rep) After pt losing his balance, pt needing a seated rest break and pt going off on a tangent unrelated to therapy, with PT needing to re-direct pt to the task   Multi-directional stepping following sheet x4 reps with looking upright and larger steps and cues to name it out loud, attempted go no go for set switching with pt having to call out the color of R/L and follow the directions. Pt unable to perform this way today. This happened at the end of session, so will practice  again next time for dual tasking. PT needing to go over directions multiple times     Gait pattern: step through pattern, decreased step length- Right, decreased stride length, decreased hip/knee flexion- Right, decreased ankle dorsiflexion- Right, shuffling, decreased trunk rotation, trunk flexed, and poor foot clearance- Right Distance walked: Various clinic distances   Assistive device utilized: Walker - 2 wheeled Level of assistance: SBA Comments: Pt needing cues for staying closer to RW, heel strike, and tall posture. Increased forward flexed posture this date. Min cues to stay inside walker at all times, especially w/turns.    PATIENT EDUCATION: Education details: Contacting Dr. Arbutus Leas regarding earlier appointment time, continue HEP  Person educated: Patient and Spouse Education method: Explanation, Demonstration, and Verbal cues Education  comprehension: verbalized understanding, returned demonstration, verbal cues required, and needs further education  HOME EXERCISE PROGRAM: Walking program, starting with 3-4 minutes daily around the house with focus on tall posture, stride length, and staying close to RW  Seated PWR Up/Step  Standing PWR Rock/Step Supine PWR Up  Access Code: [email or web address] URL: [email or web address] Date: 01/07/2023 Prepared by: Sherlie Ban  Exercises - Sit to Stand Without Arm Support  - 1 x daily - 7 x weekly - 3 sets - 10 reps - Supine Bridge  - 1 x daily - 7 x weekly - 1-2 sets - 10 reps - Modified Thomas Stretch  - 1 x daily - 7 x weekly - 3-5 minute hold  GOALS: Goals reviewed with patient? Yes  LONG TERM GOALS: Target date: 01/10/2023  Pt will be independent with final HEP for improved strength, balance, transfers and gait.  Baseline: pt reports not doing them as much as he should, reports he needs to do more  Goal status: IN PROGRESS  2.  Pt will improve gait velocity to at least 2.6 ft/s w/LRAD and SBA for improved gait efficiency and independence   Baseline: 1.95 ft/s w/o AD and min A  17.53 seconds = 1.87 ft/sec with RW   Goal status: NOT MET  3.  Pt will improve TUG time to 15 seconds or less with LRAD and supervision  in order to demo decrease fall risk. Baseline: 20 seconds with no AD, CGA  11 seconds with no AD with supervision/CGA  Goal status: MET  4.  Pt will improve 5 x STS to less than or equal to 15 seconds w/good posture and form to demonstrate improved functional strength and transfer efficiency.   Baseline: 15.41s w/BUE support, significant posterior lean and flexed posture   14.1 seconds with no UE support on 01/09/23 with no episodes of retropulsion  Goal status: MET  5.  Pt will trial various bracing options on RLE to reduce foot slap and fall frequency.  Baseline: bracing options not trialed, pt using a RW to decr fall risk.  Goal status:  N/A    UPDATED/ONGOING LTGS FOR RE-CERT  LONG TERM GOALS: Target date: 02/10/2023  Pt will be independent with final HEP for improved strength, balance, transfers and gait.  Baseline: pt reports not doing them as much as he should, reports he needs to do more  Goal status: IN PROGRESS  2.  Pt will perform 10 reps of sit <> stands with no UE support and no retropulsion in order to demo improved transfer efficiency.   Baseline: Goal status: INITIAL  3.  Pt will perform car transfer with supervision in order to demo improved functional mobility. Baseline: pt reports difficulty getting in and out of the car  Goal status: INITIAL  4.  Pt will perform floor transfer with CGA for fall recovery strategies  Baseline:  Goal status: INITIAL  5.  Pt will improve with RW to at least 720' with RPE as a 3/10 or less  in order to demo improved gait efficiency/endurance Baseline: 685' with 5/10 RPE Goal status: INTIIAL   ASSESSMENT:  CLINICAL IMPRESSION: Today's skilled session focused on larger amplitude movements with SciFit, side stepping, dual tasking, and stepping strategies. Utilized blaze pods on scanning/distraction setting with pt also dual tasking by naming animals/foods. Pt needing one episode of min/mod A for balance as pt was moving too quickly. Pt needing cues during session to focus on the task at hand. Continue POC.     OBJECTIVE IMPAIRMENTS: Abnormal gait, decreased balance, decreased cognition, decreased coordination, decreased knowledge of condition, decreased knowledge of use of DME, decreased mobility, difficulty walking, decreased strength, decreased safety awareness, impaired perceived functional ability, improper body mechanics, postural dysfunction, and pain  ACTIVITY LIMITATIONS: carrying, lifting, bending, standing, squatting, stairs, transfers, bed mobility, reach over head, locomotion level, and caring for others  PARTICIPATION LIMITATIONS: medication management,  interpersonal relationship, driving, shopping, community activity, and yard work  PERSONAL FACTORS: Age, Behavior pattern, Fitness, Past/current experiences, and 1 comorbidity: Vascular parkinsonism  are also affecting patient's functional outcome.   REHAB POTENTIAL: Fair due to diagnosis   CLINICAL DECISION MAKING: Evolving/moderate complexity  EVALUATION COMPLEXITY: Moderate  PLAN:  PT FREQUENCY: 2x/week  PT DURATION: 8 weeks  PLANNED INTERVENTIONS: Therapeutic exercises, Therapeutic activity, Neuromuscular re-education, Balance training, Gait training, Patient/Family education, Self Care, Joint mobilization, Stair training, Vestibular training, Canalith repositioning, Orthotic/Fit training, DME instructions, Manual therapy, and Re-evaluation  PLAN FOR NEXT SESSION:  Did they call Dr. Arbutus Leas? Continue to work on balance. Work on car transfers as pt reports that this was challenging. Review freezing/turns. RLE foot clearance tasks. practice gait with RW. trial braces on RLE. Floor transfer.    Drake Leach, PT, DPT 01/30/2023, 11:44 AM

## 2023-01-31 ENCOUNTER — Telehealth: Payer: Self-pay | Admitting: Neurology

## 2023-01-31 NOTE — Telephone Encounter (Signed)
Pts spouse is calling in stating that her and the pts PT is seeing some changes in the pt and would like to see if the pt could come in sooner than December.  Spouse stated that the PT is going to be sending in a note with what they have observed with the pt while the pt was doing PT.

## 2023-02-02 ENCOUNTER — Other Ambulatory Visit: Payer: Self-pay | Admitting: Neurology

## 2023-02-04 ENCOUNTER — Ambulatory Visit: Payer: Medicare Other | Admitting: Speech Pathology

## 2023-02-04 ENCOUNTER — Ambulatory Visit: Payer: Medicare Other | Admitting: Physical Therapy

## 2023-02-04 ENCOUNTER — Encounter: Payer: Self-pay | Admitting: Physical Therapy

## 2023-02-04 DIAGNOSIS — R2681 Unsteadiness on feet: Secondary | ICD-10-CM | POA: Diagnosis not present

## 2023-02-04 DIAGNOSIS — R471 Dysarthria and anarthria: Secondary | ICD-10-CM

## 2023-02-04 DIAGNOSIS — R293 Abnormal posture: Secondary | ICD-10-CM

## 2023-02-04 DIAGNOSIS — R2689 Other abnormalities of gait and mobility: Secondary | ICD-10-CM

## 2023-02-04 NOTE — Therapy (Signed)
OUTPATIENT PHYSICAL THERAPY NEURO TREATMENT Patient Name: [name] MRN: [id number] DOB:09-03-1945, 77 y.o., male Today's Date: 02/04/2023   PCP: Ronnald Nian, MD  REFERRING PROVIDER: Vladimir Faster, DO    END OF SESSION:  PT End of Session - 02/04/23 1152     Visit Number 22    Number of Visits 30   Plus eval   Date for PT Re-Evaluation 03/10/23    Authorization Type [employer]    Progress Note Due on Visit 10    PT Start Time 1150   handoff from ST   PT Stop Time 1230    PT Time Calculation (min) 40 min    Equipment Utilized During Treatment Gait belt    Activity Tolerance Patient tolerated treatment well    Behavior During Therapy WFL for tasks assessed/performed                    Past Medical History:  Diagnosis Date   ADD (attention deficit disorder)    Allergic rhinitis    skin test 02/23/08 vaccine 2009; allergy shots weekly through Dr. Maple Hudson   Anemia    takes Fe -    Anginal pain (HCC)    Ankylosing spondylitis (HCC)    Anxiety    sees therapist Vernell Leep   Arthritis    Asthma    Dr. 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 Donnie Aho   CHEILECTOMY  10/16/2011   Procedure: CHEILECTOMY;  Surgeon: Sherri Rad, MD;  Location: [facility];  Service: Orthopedics;  Laterality: Right;   CHOLECYSTECTOMY     COLONOSCOPY     CORONARY ANGIOPLASTY     1995 after MI   CORONARY ANGIOPLASTY WITH STENT PLACEMENT     1999    ESOPHAGOGASTRODUODENOSCOPY  02/18/2012   Procedure: ESOPHAGOGASTRODUODENOSCOPY (EGD);  Surgeon: Florencia Reasons, MD;  Location: [facility];  Service: Endoscopy;  Laterality: N/A;   FOOT SURGERY     joint scraped, arthritis right foot   HERNIA REPAIR     LUMBAR DISC SURGERY     x2   right index finger  mass removed- arthritis     Dr. Amanda Pea   [facility]  Right 05/18/2021   medial toe consistent with callus   SKIN BIOPSY Left 05/17/2021   actinic keratosis   SPINAL CORD STIMULATOR BATTERY EXCHANGE N/A 10/11/2021  Procedure: SPINAL CORD STIMULATOR BATTERY EXCHANGE;  Surgeon: Venita Lick, MD;  Location: [facility];  Service: Orthopedics;  Laterality: N/A;  60 mins   SPINAL CORD STIMULATOR INSERTION N/A 11/21/2016   Procedure: LUMBAR SPINAL CORD STIMULATOR INSERTION;  Surgeon: Venita Lick, MD;  Location: [facility] OR;  Service: Orthopedics;  Laterality: N/A;  Requests 2.5 hrs   TONSILLECTOMY     1953   UPPER GI ENDOSCOPY  [id number]   VASECTOMY     1980   Patient Active Problem List   Diagnosis Date Noted   OSA (obstructive sleep apnea) 11/12/2022   Parkinson's disease 11/12/2022   Peripheral vascular disease (HCC) 12/25/2021   Dizziness 12/24/2021   Leg pain, left 11/14/2021   Chronic low back pain with sciatica 11/14/2021   Spinal cord stimulator status 11/14/2021   Muscle spasm 11/14/2021   Constipation 09/11/2021   Slow transit constipation 09/11/2021   Cough 09/11/2021   Early satiety 09/11/2021   Hoarseness 09/11/2021   Iron deficiency 09/11/2021    Personal history of colonic polyps 09/11/2021   Weight decreased 09/11/2021   Esophageal motility disorder 09/11/2021   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   Pain in both feet 08/10/2021   Atherosclerosis of aorta (HCC) 08/30/2020   Mild concentric left ventricular hypertrophy (LVH)    Hypogonadism male    History of kidney stones    Erectile dysfunction    COPD (chronic obstructive pulmonary disease) (HCC)    Acquired trigger finger of left middle finger 04/22/2019   Acquired trigger finger of right middle finger 04/22/2019   Left ventricular dysfunction 10/12/2018   Ankylosing spondylitis (HCC) 10/09/2018   Presbycusis of both ears 04/23/2018   Mild intermittent asthma without complication 04/23/2018   Chronic pain 11/21/2016   Arthritis 10/15/2016   Spondylolisthesis of lumbar region 02/28/2015   Hyperlipidemia    Lumbar disc disease    Hypertension    CAD (coronary artery disease) 05/19/2012   GERD (gastroesophageal reflux disease)    Depression with anxiety    Chronic asthma 05/17/2011   Allergic rhinitis due to pollen 08/08/2010   History of benign positional vertigo    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 "We are really making progress in this department." Will be seeing [name] PA-C next week at Dr. Don Perking office.    Pt accompanied by:  Wife, [name]  PERTINENT HISTORY: Pt falls frequently, always in forward direction and insists on using back stairs despite frequent  falls over top step. Started Sinemet week of 11/04/22  PAIN:  Are you having pain? No  There were no vitals filed for this visit.   PRECAUTIONS: Fall  WEIGHT BEARING RESTRICTIONS: No  FALLS: Has patient fallen in last 6 months? Yes. Number of falls "more than I can count"  LIVING ENVIRONMENT: Lives with: lives with their spouse Lives in: House/apartment Stairs: Yes: External: 3 in front and 5 in back steps; on right going up, on left going up, can reach both, and always uses back steps (where he has the most falls) Has following equipment at home: Single point cane  PLOF: Independent  PATIENT GOALS: "I want to be in the next Olympics" "I would like to walk with minimal pain and get up and down with no assistance"  OBJECTIVE:   COGNITION: Overall cognitive status: Impaired   SENSATION: Denies numbness/tingling in BUE/BLEs  COORDINATION: Heel to shin test: WNL bilaterally   EDEMA: Frequent swelling in R ankle    POSTURE: rounded shoulders, forward head, and left pelvic obliquity  LOWER EXTREMITY MMT:  Tested in seated position   MMT Right Eval Left Eval  Hip flexion 4+ 5  Hip extension    Hip abduction 4 4  Hip adduction 5 5  Hip internal rotation    Hip external rotation    Knee flexion 5 5  Knee extension 5 5  Ankle dorsiflexion 3 5  Ankle plantarflexion    Ankle inversion    Ankle eversion    (Blank rows = not tested)   TODAY'S TREATMENT:    NMR   At bottom of stair case: Alternating step ups with contralateral march x15 reps each leg, cues for slowed pace as pt with tendency to move quickly, and placing foot fully on the step each time. Cued for tall posture and looking ahead when stepping up, min guard at times for balance.  Multi-directional stepping following sheet x4 reps with looking upright and larger steps and cues to name it out loud, attempted go no go for set switching with pt having to perform the opposite of Right/Left, but step direction  remaining the same. PT needing to keep explaining directions as it was very confusing, as pt not performing correctly or trying to step with both feet instead of one foot at a time. PT performing with pt x2 sets, and even with PT performing and calling out the proper directions, pt unable to perform correctly due to cog dual task  Side stepping in // bars with sunshine ball toss simultaneously with pt's spouse down and back x2 reps, with cues for larger steps and to continue moving while tossing, adding cognitive dual task by pt naming animals in alphabetical order down and back x3 reps, pt needing cues to continue moving  Alternating forward stepping with sunshine ball toss with pt's spouse, adding in pt naming foods when stepping. Cued for pt to alternate taking a step forwards and then stepping back to midline, max cues needed for sequencing, with pt with tendency to keep stepping both feet forwards instead of one at a time.  Standing on blue foam beam, trunk rotations in multi-directions reaching target outside of BOS  x10 reps each side. Pt more off balance when reaching across body with RUE    Gait pattern: step through pattern, decreased step length- Right, decreased stride length, decreased hip/knee flexion- Right, decreased ankle dorsiflexion- Right, shuffling, decreased trunk rotation, trunk flexed, and poor foot clearance- Right Distance walked: Various clinic distances   Assistive device utilized: Walker - 2 wheeled Level of assistance: SBA Comments: Pt needing cues for staying closer to RW, heel strike, and tall posture. Increased forward flexed posture this date. Min cues to stay inside walker at all times, especially w/turns.    PATIENT EDUCATION: Education details: Continue HEP  Person educated: Patient and Spouse Education method: Explanation, Demonstration, and Verbal cues Education comprehension: verbalized understanding, returned demonstration, verbal cues required, and needs  further education  HOME EXERCISE PROGRAM: Walking program, starting with 3-4 minutes daily around the house with focus on tall posture, stride length, and staying close to RW  Seated PWR Up/Step  Standing PWR Rock/Step Supine PWR Up  Access Code: [email or web address] URL: [email or web address] Date: 01/07/2023 Prepared by: Sherlie Ban  Exercises - Sit to Stand Without Arm Support  - 1 x daily - 7 x weekly - 3 sets - 10 reps - Supine Bridge  - 1 x daily - 7 x weekly - 1-2 sets - 10 reps - Modified Thomas Stretch  - 1 x daily - 7 x weekly - 3-5 minute hold  GOALS: Goals reviewed with patient? Yes  LONG TERM GOALS: Target date: 01/10/2023  Pt will be independent with final HEP for improved strength, balance, transfers and gait.  Baseline: pt reports not doing them as much as he should, reports he needs to do more  Goal status: IN PROGRESS  2.  Pt will improve gait velocity to at least 2.6 ft/s w/LRAD and SBA for improved gait efficiency and independence   Baseline: 1.95 ft/s w/o AD and min A  17.53 seconds = 1.87 ft/sec with RW   Goal status: NOT MET  3.  Pt will improve TUG time to 15 seconds or less with LRAD and supervision  in order to demo decrease fall risk. Baseline: 20 seconds with no AD, CGA  11 seconds with no AD with supervision/CGA  Goal status: MET  4.  Pt will improve 5 x STS to less than or equal to 15 seconds w/good posture and form to demonstrate improved functional strength and transfer efficiency.   Baseline: 15.41s w/BUE support, significant posterior lean and flexed posture   14.1 seconds with no UE support on 01/09/23 with no episodes of retropulsion  Goal status: MET  5.  Pt will trial various bracing options on RLE to reduce foot slap and fall frequency.  Baseline: bracing options not trialed, pt using a RW to decr fall risk.  Goal status: N/A    UPDATED/ONGOING LTGS FOR RE-CERT  LONG TERM GOALS: Target date: 02/10/2023  Pt will be  independent with final HEP for improved strength, balance, transfers and gait.  Baseline: pt reports not doing them as much as he should, reports he needs to do more  Goal status: IN PROGRESS  2.  Pt will perform 10 reps of sit <> stands with no UE support and no retropulsion in order to demo improved transfer efficiency.   Baseline: Goal status: INITIAL  3.  Pt will perform car transfer with supervision in order to demo improved functional mobility. Baseline: pt reports difficulty getting in and out of the car Goal status: INITIAL  4.  Pt will  perform floor transfer with CGA for fall recovery strategies  Baseline:  Goal status: INITIAL  5.  Pt will improve with RW to at least 720' with RPE as a 3/10 or less  in order to demo improved gait efficiency/endurance Baseline: 685' with 5/10 RPE Goal status: INTIIAL   ASSESSMENT:  CLINICAL IMPRESSION: Today's skilled session focused on balance strategies and functional strengthening. Attempted to try go-no-go activities for cog dual tasking with use of multi-directional stepping sheet. When asked for pt to perform the opposite of R/L, pt unable to perform correctly, despite max cues from therapist. Also worked on stepping tasks with dual tasking with cognitive task and tossing ball. Pt also challenged by this and needs cues for continuous movement. Pt to see [name] PA-C in a couple weeks for memory changes. Continue POC.     OBJECTIVE IMPAIRMENTS: Abnormal gait, decreased balance, decreased cognition, decreased coordination, decreased knowledge of condition, decreased knowledge of use of DME, decreased mobility, difficulty walking, decreased strength, decreased safety awareness, impaired perceived functional ability, improper body mechanics, postural dysfunction, and pain  ACTIVITY LIMITATIONS: carrying, lifting, bending, standing, squatting, stairs, transfers, bed mobility, reach over head, locomotion level, and caring for  others  PARTICIPATION LIMITATIONS: medication management, interpersonal relationship, driving, shopping, community activity, and yard work  PERSONAL FACTORS: Age, Behavior pattern, Fitness, Past/current experiences, and 1 comorbidity: Vascular parkinsonism  are also affecting patient's functional outcome.   REHAB POTENTIAL: Fair due to diagnosis   CLINICAL DECISION MAKING: Evolving/moderate complexity  EVALUATION COMPLEXITY: Moderate  PLAN:  PT FREQUENCY: 2x/week  PT DURATION: 8 weeks  PLANNED INTERVENTIONS: Therapeutic exercises, Therapeutic activity, Neuromuscular re-education, Balance training, Gait training, Patient/Family education, Self Care, Joint mobilization, Stair training, Vestibular training, Canalith repositioning, Orthotic/Fit training, DME instructions, Manual therapy, and Re-evaluation  PLAN FOR NEXT SESSION:  Did they call Dr. Arbutus Leas? Continue to work on balance. Work on car transfers as pt reports that this was challenging. Review freezing/turns. RLE foot clearance tasks. practice gait with RW. trial braces on RLE. Floor transfer.   Goals due next week and will have to update goal date.    Drake Leach, PT, DPT 02/04/2023, 1:38 PM

## 2023-02-04 NOTE — Therapy (Signed)
OUTPATIENT SPEECH LANGUAGE PATHOLOGY [facility] - DISCHARGE   Patient Name: [name] MRN: [id number] DOB:May 26, 1946, 77 y.o., male Today's Date: 02/04/2023  PCP: Ronnald Nian, MD REFERRING PROVIDER: Vladimir Faster, DO  END OF SESSION:  End of Session - 02/04/23 1101     Visit Number 13    Number of Visits 13    Date for SLP Re-Evaluation 02/07/23   recert   Authorization Type [employer]    Progress Note Due on Visit 10    SLP Start Time 1101    SLP Stop Time  1145    SLP Time Calculation (min) 44 min    Activity Tolerance Patient tolerated treatment well              Past Medical History:  Diagnosis Date   ADD (attention deficit disorder)    Allergic rhinitis    skin test 02/23/08 vaccine 2009; allergy shots weekly through Dr. Maple Hudson   Anemia    takes Fe -    Anginal pain (HCC)    Ankylosing spondylitis (HCC)    Anxiety    sees therapist Vernell Leep   Arthritis    Asthma    Dr. 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 Donnie Aho   CHEILECTOMY  10/16/2011   Procedure: CHEILECTOMY;  Surgeon: Sherri Rad, MD;  Location: [facility];  Service: Orthopedics;  Laterality: Right;   CHOLECYSTECTOMY     COLONOSCOPY     CORONARY ANGIOPLASTY     1995 after MI   CORONARY ANGIOPLASTY WITH STENT PLACEMENT     1999    ESOPHAGOGASTRODUODENOSCOPY  02/18/2012   Procedure: ESOPHAGOGASTRODUODENOSCOPY (EGD);  Surgeon: Florencia Reasons, MD;  Location: [facility];  Service: Endoscopy;  Laterality: N/A;   FOOT SURGERY     joint scraped, arthritis right foot   HERNIA REPAIR     LUMBAR DISC SURGERY     x2   right index finger  mass removed- arthritis     Dr. Amanda Pea   [facility]  Right 05/18/2021   medial toe consistent with callus   SKIN BIOPSY Left 05/17/2021   actinic keratosis   SPINAL CORD STIMULATOR BATTERY EXCHANGE N/A 10/11/2021   Procedure: SPINAL CORD STIMULATOR BATTERY EXCHANGE;  Surgeon: Venita Lick, MD;  Location: [facility];  Service: Orthopedics;  Laterality: N/A;  60 mins   SPINAL CORD STIMULATOR  INSERTION N/A 11/21/2016   Procedure: LUMBAR SPINAL CORD STIMULATOR INSERTION;  Surgeon: Venita Lick, MD;  Location: [facility];  Service: Orthopedics;  Laterality: N/A;  Requests 2.5 hrs   TONSILLECTOMY     1953   UPPER GI ENDOSCOPY  [id number]   VASECTOMY     1980   Patient Active Problem List   Diagnosis Date Noted   OSA (obstructive sleep apnea) 11/12/2022   Parkinson's disease 11/12/2022   Peripheral vascular disease (HCC) 12/25/2021   Dizziness 12/24/2021   Leg pain, left 11/14/2021   Chronic low back pain with sciatica 11/14/2021   Spinal cord stimulator status 11/14/2021   Muscle spasm 11/14/2021   Constipation 09/11/2021   Slow transit constipation 09/11/2021   Cough 09/11/2021   Early satiety 09/11/2021   Hoarseness 09/11/2021   Iron deficiency 09/11/2021   Personal history of colonic polyps 09/11/2021   Weight decreased 09/11/2021   Esophageal motility disorder 09/11/2021    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   Pain in both feet 08/10/2021   Atherosclerosis of aorta (HCC) 08/30/2020   Mild concentric left ventricular hypertrophy (LVH)    Hypogonadism male    History of kidney stones    Erectile dysfunction    COPD (chronic obstructive pulmonary disease) (HCC)    Acquired trigger finger of left middle finger 04/22/2019   Acquired trigger finger of right middle finger 04/22/2019   Left ventricular dysfunction 10/12/2018   Ankylosing spondylitis (HCC) 10/09/2018   Presbycusis of both ears 04/23/2018   Mild intermittent asthma without complication 04/23/2018   Chronic pain 11/21/2016   Arthritis 10/15/2016   Spondylolisthesis of lumbar region 02/28/2015   Hyperlipidemia    Lumbar disc disease    Hypertension    CAD (coronary artery disease) 05/19/2012   GERD (gastroesophageal reflux disease)    Depression with anxiety    Chronic asthma 05/17/2011   Allergic rhinitis due to pollen 08/08/2010   History of benign positional vertigo    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  Patient was in the emergency room on April 1 with mental status change.  Patient had apparently had a URI and had been taking Mucinex and had some degree of confusion with the URI/medication.  Patient had gotten in the shower and his wife went into check on him 30 minutes later and found that he had fallen.  There was apparently no syncope and patient did not hit his head.  Patient went to the hospital and was found to be hypokalemic at 2.3.  Patient did not have MRI because of spinal cord stimulator.  Patient followed up with primary care  physician, at which point they noted that patient had been having a chronic change in gait with forward leaning trunk and gait for several months (perhaps longer per pt/wife)   PAIN:  Are you having pain? No  PATIENT GOALS: "get back to singing"  OBJECTIVE:   TODAY'S TREATMENT:  02/04/23: Target improving vocal quality and increasing intensity through progressively difficulty speech tasks using Speak Out! program, lesson 12. ST leads pt through exercises providing occasional model prior to pt execution. usual min-A required to achieve target volume this date. Demonstrates improved vocal intensity and vocal clarity during warm up exercises,  Conversational sample of approx 10 minutes, pt a able to demonstrate use of strong, intentional voice with reduced hoarseness occasional mod-A.  Completed CPIB to assess pt perception of improvement over therapy course: The Communicative Participation Item Bank        Does your condition interfere with... Pt Rating   ...talking with people you know 2   ...communicating when you need to say something quickly 1   ...talking with people you do not know 2   ...communicating when you are out in your community 3   ...asking questions in a conversation 3   ....communicating in a small group of people 3   ...having a long conversation 2   ...giving detailed infomrmation 1   ...getting your turn in a fast moving conversation 2   ...trying to persuade a friend or family member to see a different point of view 2  0= very much, 3= not at all  01/30/23: SLP led pt through semi-occluded vocal tract exercises to warm up voice. Pt demonstrates occasional decreased clarity in tone, coinciding with reduced amplitude of bubbles during exercise. Education provided re: use of bubbles as biofeedback. Determined that use of resonant hum  facilitative return to forward resonance and air flow. Led pt through singing exercises with pt selected songs. Usual verbal cues for use of intentional voice and using appropriate breath support to decreased instance of hoarseness.   01/28/23: Re-education provided re: need for use of intentional speech to address hoarseness and volume decay. Addressed dysarthria via Speak Out lesson 4. Pt able to achieve clear voicing with increased intensity during warm up and reading exercises. Pt required usual mod-A to carryover strategies.   01/15/23: Forgot Speak Out! Book and couldn't recall current lesson. Per pt report, it appears self-consciousness and perception of over-loudness are barriers to completing recommended HEP. Reviewed SOVT exercises with intermittent mod A required to optimize effort and accuracy. Has not been completing at home. Targeted conversational speech with SLP providing intermittent cues to project voice when intensity decayed and hoarseness evident. Occasional self-corrections exhibited but overall inconsistent awareness. Re-educated importance of completing HEP to maximize carryover.    01/09/23: Reviewed recommendation for vocal warm up exercises to reduce hoarseness when participating in choir practice. Led pt through semi occluded vocal tract exercises with use of water/straw to provide biofeedback for flow of exhalation. Pt with teach back of exercise with 80% accuracy. Target improved vocal quality following straw phonation during singing exercises, with pt demonstrating clear voicing with usual mod-A. Pt presenting with x4 throat clears initial 8 minutes. Does not recall any avoidance strategies. Provided handout, allowed pt to read. Pt ID "pursed lip breathing" strategy as one he feels he can implement when urge to clear throat occurs. Pt required ongoing cues to ID throat clears and attempt implementation of alternative.    PATIENT EDUCATION: Education details: see above.  Person  educated: Patient and Spouse Education method: Medical illustrator Education comprehension: verbalized understanding and returned demonstration  HOME EXERCISE PROGRAM: Speak Out!    GOALS: Goals reviewed with patient? Yes  SHORT TERM GOALS: Target date: 12/27/2022  Pt will complete HEP at least 1x/day given occasional min A over 2 sessions  Baseline: Goal status: Deferred--Pt has not received Speak Out! Book yet.   2.  Pt will meet or exceed dB targets during reading exercises with 90% accuracy with rare min-A.  Baseline:  Goal status: MET  3.  Pt will demonstrate SOVTE and vocal function exercises with mod-I.  Baseline:  Goal status: MET  LONG TERM GOALS: Target date: 02/07/23 (recert)  1.  Pt and spouse will report reduced need to repeat information at home > 1 week.  Baseline: 21.5/32 Goal status: MET  2.  Pt will utilize dysarthria compensations in 5-10 minute conversation and maintain WNL conversational volume (70-72 dB) given occasional min A over 2 sessions   Baseline:  Goal status: PARTIALLY MET  3.  Pt will demonstrate intentional voice and flow phonation during singing exercise with min-A  Baseline:  Goal status: MET  4.  Pt will report improvement via PROM by d/c   Baseline: CPIB=21.5, 21 at d/c   Goal status: MET  ASSESSMENT:  CLINICAL IMPRESSION: Pt demonstrating improved ability to use intentional voice which decreases perception of hoarseness. Continues to benefit   OBJECTIVE IMPAIRMENTS: Objective impairments include dysarthria. These impairments are limiting patient from effectively communicating at home and in community.Factors affecting potential to achieve goals and functional outcome are co-morbidities and medical prognosis. Patient will benefit from skilled SLP services to address above impairments and improve overall function.  REHAB POTENTIAL: Excellent  PLAN:  SLP FREQUENCY: 1-2x/week  SLP DURATION: 8 weeks   PLANNED  INTERVENTIONS: Cueing hierachy, Oral motor exercises, SLP instruction and feedback, Compensatory strategies, and Patient/family education  SPEECH THERAPY DISCHARGE SUMMARY  Visits from Start of Care: 13  Current functional level related to goals / functional outcomes: Improved understanding of strategies to improve voice quality and volume, though continues to benefit from SLP cues to utilize strategies in conversational speech. Is completed HEP and continues to engage in communication opportunities and choir.    Remaining deficits: Dysarthria    Education / Equipment: Speak Out!, dysarthria strategies/compensations, vocal warm up, HEP  Patient agrees to discharge. Patient goals were partially met. Patient is being discharged due to maximized rehab potential.    Maia Breslow, CCC-SLP 02/04/2023, 11:02 AM

## 2023-02-06 ENCOUNTER — Ambulatory Visit: Payer: Medicare Other | Admitting: Physical Therapy

## 2023-02-06 DIAGNOSIS — R2681 Unsteadiness on feet: Secondary | ICD-10-CM | POA: Diagnosis not present

## 2023-02-06 DIAGNOSIS — M6281 Muscle weakness (generalized): Secondary | ICD-10-CM

## 2023-02-06 DIAGNOSIS — R2689 Other abnormalities of gait and mobility: Secondary | ICD-10-CM

## 2023-02-06 NOTE — Therapy (Signed)
OUTPATIENT PHYSICAL THERAPY NEURO TREATMENT Patient Name: [name] MRN: [id number] DOB:Oct 14, 1945, 77 y.o., male Today's Date: 02/06/2023   PCP: Dwayne Nian, MD  REFERRING PROVIDER: Vladimir Faster, DO    END OF SESSION:  PT End of Session - 02/06/23 1059     Visit Number 23    Number of Visits 30   Plus eval   Date for PT Re-Evaluation 03/10/23    Authorization Type [employer] Medicare    Progress Note Due on Visit 10    PT Start Time 1058    PT Stop Time 1142    PT Time Calculation (min) 44 min    Equipment Utilized During Treatment --    Activity Tolerance Patient tolerated treatment well    Behavior During Therapy WFL for tasks assessed/performed                     Past Medical History:  Diagnosis Date   ADD (attention deficit disorder)    Allergic rhinitis    skin test 02/23/08 vaccine 2009; allergy shots weekly through Dr. Maple Patterson   Anemia    takes Fe -    Anginal pain (HCC)    Ankylosing spondylitis (HCC)    Anxiety    sees therapist [name]   Arthritis    Asthma    Dr. 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 Donnie Patterson   CHEILECTOMY  10/16/2011   Procedure: CHEILECTOMY;  Surgeon: Dwayne Rad, MD;  Location: [facility];  Service: Orthopedics;  Laterality: Right;   CHOLECYSTECTOMY     COLONOSCOPY     CORONARY ANGIOPLASTY     1995 after MI   CORONARY ANGIOPLASTY WITH STENT PLACEMENT     1999    ESOPHAGOGASTRODUODENOSCOPY  02/18/2012   Procedure: ESOPHAGOGASTRODUODENOSCOPY (EGD);  Surgeon: Dwayne Reasons, MD;  Location: [facility];  Service: Endoscopy;  Laterality: N/A;   FOOT SURGERY     joint scraped, arthritis right foot   HERNIA REPAIR     LUMBAR DISC SURGERY     x2   right index finger  mass removed- arthritis     Dr. Amanda Patterson   [facility]  Right 05/18/2021   medial toe consistent with callus   SKIN BIOPSY Left 05/17/2021   actinic keratosis   SPINAL CORD STIMULATOR BATTERY EXCHANGE N/A 10/11/2021   Procedure: SPINAL CORD  STIMULATOR BATTERY EXCHANGE;  Surgeon: Dwayne Lick, MD;  Location: [facility] OR;  Service: Orthopedics;  Laterality: N/A;  60 mins   SPINAL CORD STIMULATOR INSERTION N/A 11/21/2016   Procedure: LUMBAR SPINAL CORD STIMULATOR INSERTION;  Surgeon: Dwayne Lick, MD;  Location: [facility] OR;  Service: Orthopedics;  Laterality: N/A;  Requests 2.5 hrs   TONSILLECTOMY     1953   UPPER GI ENDOSCOPY  [id number]   VASECTOMY     1980   Patient Active Problem List   Diagnosis Date Noted   OSA (obstructive sleep apnea) 11/12/2022   Parkinson's disease 11/12/2022   Peripheral vascular disease (HCC) 12/25/2021   Dizziness 12/24/2021   Leg pain, left 11/14/2021   Chronic low back pain with sciatica 11/14/2021   Spinal cord stimulator status 11/14/2021   Muscle spasm 11/14/2021   Constipation 09/11/2021   Slow transit constipation 09/11/2021   Cough 09/11/2021   Early satiety 09/11/2021   Hoarseness 09/11/2021   Iron deficiency 09/11/2021   Personal  history of colonic polyps 09/11/2021   Weight decreased 09/11/2021   Esophageal motility disorder 09/11/2021   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   Pain in both feet 08/10/2021   Atherosclerosis of aorta (HCC) 08/30/2020   Mild concentric left ventricular hypertrophy (LVH)    Hypogonadism male    History of kidney stones    Erectile dysfunction    COPD (chronic obstructive pulmonary disease) (HCC)    Acquired trigger finger of left middle finger 04/22/2019   Acquired trigger finger of right middle finger 04/22/2019   Left ventricular dysfunction 10/12/2018   Ankylosing spondylitis (HCC) 10/09/2018   Presbycusis of both ears 04/23/2018   Mild intermittent asthma without complication 04/23/2018   Chronic pain 11/21/2016   Arthritis 10/15/2016   Spondylolisthesis of lumbar region 02/28/2015   Hyperlipidemia    Lumbar disc disease    Hypertension    CAD (coronary artery disease) 05/19/2012   GERD (gastroesophageal reflux disease)    Depression with anxiety    Chronic asthma 05/17/2011   Allergic rhinitis due to pollen 08/08/2010   History of benign positional vertigo    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 Saw Dr. Ethelene Patterson this morning and received an injection. Was very painful, received clearance to do PT from Dr. Ethelene Patterson today. Is okay now. No falls.    Pt accompanied by:  Wife, [name]  PERTINENT HISTORY: Pt falls frequently, always in forward  direction and insists on using back stairs despite frequent falls over top step. Started Sinemet week of 11/04/22  PAIN:  Are you having pain? No  There were no vitals filed for this visit.   PRECAUTIONS: Fall  WEIGHT BEARING RESTRICTIONS: No  FALLS: Has patient fallen in last 6 months? Yes. Number of falls "more than I can count"  LIVING ENVIRONMENT: Lives with: lives with their spouse Lives in: House/apartment Stairs: Yes: External: 3 in front and 5 in back steps; on right going up, on left going up, can reach both, and always uses back steps (where he has the most falls) Has following equipment at home: Single point cane  PLOF: Independent  PATIENT GOALS: "I want to be in the next Olympics" "I would like to walk with minimal pain and get up and down with no assistance"  OBJECTIVE:   COGNITION: Overall cognitive status: Impaired   SENSATION: Denies numbness/tingling in BUE/BLEs  COORDINATION: Heel to shin test: WNL bilaterally   EDEMA: Frequent swelling in R ankle    POSTURE: rounded shoulders, forward head, and left pelvic obliquity  LOWER EXTREMITY MMT:  Tested in seated position   MMT Right Eval Left Eval  Hip flexion 4+ 5  Hip extension    Hip abduction 4 4  Hip adduction 5 5  Hip internal rotation    Hip external rotation    Knee flexion 5 5  Knee extension 5 5  Ankle dorsiflexion 3 5  Ankle plantarflexion    Ankle inversion    Ankle eversion    (Blank rows = not tested)   TODAY'S TREATMENT:    Self-Care/home management  Entirety of session spent discussing POC moving forward and upcoming appointment w/Sara Wertman. Pt reports he has noticed changes in his emotions, as he cries very easily and without warning. Pt also reports he has been unable to paint and is overall depressed. Encouraged pt to seek out counseling services and speak to [name] about this as well.  Informed pt that we will plan to DC from PT on 9/17, as pt is to be performing  his HEP at home. Pt reports he is performing them "some" but not a lot. Reminded pt that he has made improvements in PT, most notably w/his reduction in falls. Pt verbalized understanding.  Encouraged pt to paint his emotions on a canvas as a therapeutic activity. Pt verbalized understanding.  Provided pt w/updated appointment calendar per pt request.    Gait pattern: step through pattern, decreased step length- Right, decreased stride length, decreased hip/knee flexion- Right, decreased ankle dorsiflexion- Right, shuffling, decreased trunk rotation, trunk flexed, and poor foot clearance- Right Distance walked: Various clinic distances   Assistive device utilized: Walker - 2 wheeled Level of assistance: SBA Comments: Pt needing cues for staying closer to RW, heel strike, and tall posture. Increased forward flexed posture this date. Min cues to stay inside walker at all times, especially w/turns.    PATIENT EDUCATION: Education details: Continue HEP, see above Person educated: Patient and Spouse Education method: Explanation, Demonstration, and Verbal cues Education comprehension: verbalized understanding, returned demonstration, verbal cues required, and needs further education  HOME EXERCISE PROGRAM: Walking program,  starting with 3-4 minutes daily around the house with focus on tall posture, stride length, and staying close to RW  Seated PWR Up/Step  Standing PWR Rock/Step Supine PWR Up  Access Code: [email or web address] URL: [email or web address] Date: 01/07/2023 Prepared by: Sherlie Ban  Exercises - Sit to Stand Without Arm Support  - 1 x daily - 7 x weekly - 3 sets - 10 reps - Supine Bridge  - 1 x daily - 7 x weekly - 1-2 sets - 10 reps - Modified Thomas Stretch  - 1 x daily - 7 x weekly - 3-5 minute hold  GOALS: Goals reviewed with patient? Yes  LONG TERM GOALS: Target date: 01/10/2023  Pt will be independent with final HEP for improved strength, balance, transfers  and gait.  Baseline: pt reports not doing them as much as he should, reports he needs to do more  Goal status: IN PROGRESS  2.  Pt will improve gait velocity to at least 2.6 ft/s w/LRAD and SBA for improved gait efficiency and independence   Baseline: 1.95 ft/s w/o AD and min A  17.53 seconds = 1.87 ft/sec with RW   Goal status: NOT MET  3.  Pt will improve TUG time to 15 seconds or less with LRAD and supervision  in order to demo decrease fall risk. Baseline: 20 seconds with no AD, CGA  11 seconds with no AD with supervision/CGA  Goal status: MET  4.  Pt will improve 5 x STS to less than or equal to 15 seconds w/good posture and form to demonstrate improved functional strength and transfer efficiency.   Baseline: 15.41s w/BUE support, significant posterior lean and flexed posture   14.1 seconds with no UE support on 01/09/23 with no episodes of retropulsion  Goal status: MET  5.  Pt will trial various bracing options on RLE to reduce foot slap and fall frequency.  Baseline: bracing options not trialed, pt using a RW to decr fall risk.  Goal status: N/A    UPDATED/ONGOING LTGS FOR RE-CERT  LONG TERM GOALS: Target date: 02/10/2023  Pt will be independent with final HEP for improved strength, balance, transfers and gait.  Baseline: pt reports not doing them as much as he should, reports he needs to do more  Goal status: IN PROGRESS  2.  Pt will perform 10 reps of sit <> stands with no UE support and no retropulsion in order to demo improved transfer efficiency.   Baseline: Goal status: INITIAL  3.  Pt will perform car transfer with supervision in order to demo improved functional mobility. Baseline: pt reports difficulty getting in and out of the car Goal status: INITIAL  4.  Pt will perform floor transfer with CGA for fall recovery strategies  Baseline:  Goal status: INITIAL  5.  Pt will improve with RW to at least 720' with RPE as a 3/10 or less  in order to demo  improved gait efficiency/endurance Baseline: 685' with 5/10 RPE Goal status: INTIIAL   ASSESSMENT:  CLINICAL IMPRESSION: Session limited as pt received injection to spine prior to session and is overall feeling under the weather. Session spent discussing pt's emotions w/Parkinsonism diagnosis and upcomming appointment w/Sara Gwynneth Munson. Pt very emotional regarding this, so recommended pt seek out counseling services in which he is in agreement with. Continue POC.    OBJECTIVE IMPAIRMENTS: Abnormal gait, decreased balance, decreased cognition, decreased coordination, decreased knowledge of condition, decreased knowledge of use of DME, decreased mobility, difficulty walking, decreased strength, decreased  safety awareness, impaired perceived functional ability, improper body mechanics, postural dysfunction, and pain  ACTIVITY LIMITATIONS: carrying, lifting, bending, standing, squatting, stairs, transfers, bed mobility, reach over head, locomotion level, and caring for others  PARTICIPATION LIMITATIONS: medication management, interpersonal relationship, driving, shopping, community activity, and yard work  PERSONAL FACTORS: Age, Behavior pattern, Fitness, Past/current experiences, and 1 comorbidity: Vascular parkinsonism  are also affecting patient's functional outcome.   REHAB POTENTIAL: Fair due to diagnosis   CLINICAL DECISION MAKING: Evolving/moderate complexity  EVALUATION COMPLEXITY: Moderate  PLAN:  PT FREQUENCY: 2x/week  PT DURATION: 8 weeks  PLANNED INTERVENTIONS: Therapeutic exercises, Therapeutic activity, Neuromuscular re-education, Balance training, Gait training, Patient/Family education, Self Care, Joint mobilization, Stair training, Vestibular training, Canalith repositioning, Orthotic/Fit training, DME instructions, Manual therapy, and Re-evaluation  PLAN FOR NEXT SESSION:  Continue to work on balance. Work on car transfers as pt reports that this was challenging. Review  freezing/turns. RLE foot clearance tasks. practice gait with RW. trial braces on RLE. Floor transfer. Car transfer, blaze pods dual task  Goals due next week and will have to update goal date.    Jill Alexanders Itzael Liptak, PT, DPT 02/06/2023, 11:47 AM

## 2023-02-11 ENCOUNTER — Ambulatory Visit: Payer: Medicare Other | Attending: Neurology | Admitting: Physical Therapy

## 2023-02-11 DIAGNOSIS — R2681 Unsteadiness on feet: Secondary | ICD-10-CM | POA: Diagnosis present

## 2023-02-11 DIAGNOSIS — M6281 Muscle weakness (generalized): Secondary | ICD-10-CM | POA: Diagnosis present

## 2023-02-11 DIAGNOSIS — R2689 Other abnormalities of gait and mobility: Secondary | ICD-10-CM | POA: Insufficient documentation

## 2023-02-11 NOTE — Therapy (Signed)
OUTPATIENT PHYSICAL THERAPY NEURO TREATMENT Patient Name: [name] MRN: [id number] DOB:10-07-1945, 77 y.o., male Today's Date: 02/11/2023   PCP: Ronnald Nian, MD  REFERRING PROVIDER: Vladimir Faster, DO    END OF SESSION:  PT End of Session - 02/11/23 1105     Visit Number 24    Number of Visits 30   Plus eval   Date for PT Re-Evaluation 03/10/23    Authorization Type [employer] Medicare    Progress Note Due on Visit 10    PT Start Time 1102    PT Stop Time 1147    PT Time Calculation (min) 45 min    Equipment Utilized During Treatment Gait belt    Activity Tolerance Patient tolerated treatment well    Behavior During Therapy WFL for tasks assessed/performed                     Past Medical History:  Diagnosis Date   ADD (attention deficit disorder)    Allergic rhinitis    skin test 02/23/08 vaccine 2009; allergy shots weekly through Dr. Maple Hudson   Anemia    takes Fe -    Anginal pain (HCC)    Ankylosing spondylitis (HCC)    Anxiety    sees therapist Vernell Leep   Arthritis    Asthma    Dr. 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 Donnie Aho   CHEILECTOMY  10/16/2011   Procedure: CHEILECTOMY;  Surgeon: Sherri Rad, MD;  Location: [facility];  Service: Orthopedics;  Laterality: Right;   CHOLECYSTECTOMY     COLONOSCOPY     CORONARY ANGIOPLASTY     1995 after MI   CORONARY ANGIOPLASTY WITH STENT PLACEMENT     1999    ESOPHAGOGASTRODUODENOSCOPY  02/18/2012   Procedure: ESOPHAGOGASTRODUODENOSCOPY (EGD);  Surgeon: Florencia Reasons, MD;  Location: [facility];  Service: Endoscopy;  Laterality: N/A;   FOOT SURGERY     joint scraped, arthritis right foot   HERNIA REPAIR     LUMBAR DISC SURGERY     x2   right index finger  mass removed- arthritis     Dr. Amanda Pea   [facility]  Right 05/18/2021   medial toe consistent with callus   SKIN BIOPSY Left 05/17/2021   actinic keratosis   SPINAL CORD STIMULATOR BATTERY EXCHANGE N/A 10/11/2021   Procedure: SPINAL  CORD STIMULATOR BATTERY EXCHANGE;  Surgeon: Venita Lick, MD;  Location: [facility] OR;  Service: Orthopedics;  Laterality: N/A;  60 mins   SPINAL CORD STIMULATOR INSERTION N/A 11/21/2016   Procedure: LUMBAR SPINAL CORD STIMULATOR INSERTION;  Surgeon: Venita Lick, MD;  Location: [facility];  Service: Orthopedics;  Laterality: N/A;  Requests 2.5 hrs   TONSILLECTOMY     1953   UPPER GI ENDOSCOPY  [id number]   VASECTOMY     1980   Patient Active Problem List   Diagnosis Date Noted   OSA (obstructive sleep apnea) 11/12/2022   Parkinson's disease 11/12/2022   Peripheral vascular disease (HCC) 12/25/2021   Dizziness 12/24/2021   Leg pain, left 11/14/2021   Chronic low back pain with sciatica 11/14/2021   Spinal cord stimulator status 11/14/2021   Muscle spasm 11/14/2021   Constipation 09/11/2021   Slow transit constipation 09/11/2021   Cough 09/11/2021   Early satiety 09/11/2021   Hoarseness 09/11/2021   Iron deficiency 09/11/2021    Personal history of colonic polyps 09/11/2021   Weight decreased 09/11/2021   Esophageal motility disorder 09/11/2021   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   Pain in both feet 08/10/2021   Atherosclerosis of aorta (HCC) 08/30/2020   Mild concentric left ventricular hypertrophy (LVH)    Hypogonadism male    History of kidney stones    Erectile dysfunction    COPD (chronic obstructive pulmonary disease) (HCC)    Acquired trigger finger of left middle finger 04/22/2019   Acquired trigger finger of right middle finger 04/22/2019   Left ventricular dysfunction 10/12/2018   Ankylosing spondylitis (HCC) 10/09/2018   Presbycusis of both ears 04/23/2018   Mild intermittent asthma without complication 04/23/2018   Chronic pain 11/21/2016   Arthritis 10/15/2016   Spondylolisthesis of lumbar region 02/28/2015   Hyperlipidemia    Lumbar disc disease    Hypertension    CAD (coronary artery disease) 05/19/2012   GERD (gastroesophageal reflux disease)    Depression with anxiety    Chronic asthma 05/17/2011   Allergic rhinitis due to pollen 08/08/2010   History of benign positional vertigo    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 Thinks he is depressed and that is why he has been more emotional lately. No falls   Pt accompanied by:  Wife, [name]  PERTINENT HISTORY: Pt falls frequently, always in forward direction and insists on using back  stairs despite frequent falls over top step. Started Sinemet week of 11/04/22  PAIN:  Are you having pain? No  There were no vitals filed for this visit.   PRECAUTIONS: Fall  WEIGHT BEARING RESTRICTIONS: No  FALLS: Has patient fallen in last 6 months? Yes. Number of falls "more than I can count"  LIVING ENVIRONMENT: Lives with: lives with their spouse Lives in: House/apartment Stairs: Yes: External: 3 in front and 5 in back steps; on right going up, on left going up, can reach both, and always uses back steps (where he has the most falls) Has following equipment at home: Single point cane  PLOF: Independent  PATIENT GOALS: "I want to be in the next Olympics" "I would like to walk with minimal pain and get up and down with no assistance"  OBJECTIVE:   COGNITION: Overall cognitive status: Impaired   SENSATION: Denies numbness/tingling in BUE/BLEs  COORDINATION: Heel to shin test: WNL bilaterally   EDEMA: Frequent swelling in R ankle    POSTURE: rounded shoulders, forward head, and left pelvic obliquity  LOWER EXTREMITY MMT:  Tested in seated position   MMT Right Eval Left Eval  Hip flexion 4+ 5  Hip extension    Hip abduction 4 4  Hip adduction 5 5  Hip internal rotation    Hip external rotation    Knee flexion 5 5  Knee extension 5 5  Ankle dorsiflexion 3 5  Ankle plantarflexion    Ankle inversion    Ankle eversion    (Blank rows = not tested)   TODAY'S TREATMENT:    Ther Act  LTG Assessment  Practiced car transfer x3 and pt able to perform at mod I level w/no difficulty noted. Pt reports he feels as though he is "super slow", but pt able to stand from low car seat w/o UE support and no bradykinesia noted.   Reviewed freezing strategies w/pt as he could not recall from previous education. Pt able to teach back "stop and reset" by end of session. Pt reports he is freezing the most when he is about to go down the steps to his basement to do laundry. Pt  reports these steps do not have railings and he frequently carries a [employer] up/down despite [name] telling him not to. Discussed safety risks associated with this and informed pt it is not safe to be navigating these steps w/o rails and even more unsafe to be carrying a heavy object. Pt verbalized understanding but was visibly upset over this.  Pt inquiring about when he can return to driving. Informed pt that therapist cannot clear pt to drive, but verbalized her concerns regarding pt's change to cognition. Informed pt to discuss this w/Sara Wertman next week.    Gait pattern: step through pattern, decreased step length- Right, decreased stride length, decreased hip/knee flexion- Right, decreased ankle dorsiflexion- Right, shuffling, decreased trunk rotation, trunk flexed, and poor foot clearance- Right Distance walked: Various clinic distances   Assistive device utilized: Walker - 2 wheeled Level of assistance: SBA Comments: Pt needing cues for staying closer to RW, heel strike, and tall posture. Increased forward flexed posture this date. Min cues to stay inside  walker at all times, especially w/turns.    PATIENT EDUCATION: Education details: Continue HEP, see above Person educated: Patient and Spouse Education method: Explanation, Demonstration, and Verbal cues Education comprehension: verbalized understanding, returned demonstration, verbal cues required, and needs further education  HOME EXERCISE PROGRAM: Walking program, starting with 3-4 minutes daily around the house with focus on tall posture, stride length, and staying close to RW  Seated PWR Up/Step  Standing PWR Rock/Step Supine PWR Up  Access Code: [email or web address] URL: [email or web address] Date: 01/07/2023 Prepared by: Sherlie Ban  Exercises - Sit to Stand Without Arm Support  - 1 x daily - 7 x weekly - 3 sets - 10 reps - Supine Bridge  - 1 x daily - 7 x weekly - 1-2 sets - 10 reps - Modified Thomas  Stretch  - 1 x daily - 7 x weekly - 3-5 minute hold  GOALS: Goals reviewed with patient? Yes  LONG TERM GOALS: Target date: 01/10/2023  Pt will be independent with final HEP for improved strength, balance, transfers and gait.  Baseline: pt reports not doing them as much as he should, reports he needs to do more  Goal status: IN PROGRESS  2.  Pt will improve gait velocity to at least 2.6 ft/s w/LRAD and SBA for improved gait efficiency and independence   Baseline: 1.95 ft/s w/o AD and min A  17.53 seconds = 1.87 ft/sec with RW   Goal status: NOT MET  3.  Pt will improve TUG time to 15 seconds or less with LRAD and supervision  in order to demo decrease fall risk. Baseline: 20 seconds with no AD, CGA  11 seconds with no AD with supervision/CGA  Goal status: MET  4.  Pt will improve 5 x STS to less than or equal to 15 seconds w/good posture and form to demonstrate improved functional strength and transfer efficiency.   Baseline: 15.41s w/BUE support, significant posterior lean and flexed posture   14.1 seconds with no UE support on 01/09/23 with no episodes of retropulsion  Goal status: MET  5.  Pt will trial various bracing options on RLE to reduce foot slap and fall frequency.  Baseline: bracing options not trialed, pt using a RW to decr fall risk.  Goal status: N/A    UPDATED/ONGOING LTGS FOR RE-CERT  LONG TERM GOALS: Target date: 02/25/2023  Pt will be independent with final HEP for improved strength, balance, transfers and gait.  Baseline: pt reports not doing them as much as he should, reports he needs to do more  Goal status: IN PROGRESS  2.  Pt will perform 10 reps of sit <> stands with no UE support and no retropulsion in order to demo improved transfer efficiency.   Baseline: Goal status: INITIAL  3.  Pt will perform car transfer with supervision in order to demo improved functional mobility. Baseline: pt reports difficulty getting in and out of the car; mod I  on 9/3  Goal status: MET  4.  Pt will perform floor transfer with CGA for fall recovery strategies  Baseline:  Goal status: INITIAL  5.  Pt will improve with RW to at least 720' with RPE as a 3/10 or less  in order to demo improved gait efficiency/endurance Baseline: 70' with 5/10 RPE Goal status: INTIIAL   ASSESSMENT:  CLINICAL IMPRESSION: Emphasis of skilled PT session on LTG assessment, education on freezing and safety at home. Pt has met 1 of 5 LTGs so far, performing a car transfer  at mod I level x3 reps. Goal date updated to reflect addition to POC. Pt reports he has had more freezing episodes and was unable to recall freezing strategies from previous education. Pt reports he has been carrying a laundry basket up/down his stairs at home despite safety concerns, so spent remainder of session reviewing fall prevention techniques and safety at home. Pt reluctantly understanding of therapist's advice. Continue POC.     OBJECTIVE IMPAIRMENTS: Abnormal gait, decreased balance, decreased cognition, decreased coordination, decreased knowledge of condition, decreased knowledge of use of DME, decreased mobility, difficulty walking, decreased strength, decreased safety awareness, impaired perceived functional ability, improper body mechanics, postural dysfunction, and pain  ACTIVITY LIMITATIONS: carrying, lifting, bending, standing, squatting, stairs, transfers, bed mobility, reach over head, locomotion level, and caring for others  PARTICIPATION LIMITATIONS: medication management, interpersonal relationship, driving, shopping, community activity, and yard work  PERSONAL FACTORS: Age, Behavior pattern, Fitness, Past/current experiences, and 1 comorbidity: Vascular parkinsonism  are also affecting patient's functional outcome.   REHAB POTENTIAL: Fair due to diagnosis   CLINICAL DECISION MAKING: Evolving/moderate complexity  EVALUATION COMPLEXITY: Moderate  PLAN:  PT FREQUENCY:  2x/week  PT DURATION: 8 weeks  PLANNED INTERVENTIONS: Therapeutic exercises, Therapeutic activity, Neuromuscular re-education, Balance training, Gait training, Patient/Family education, Self Care, Joint mobilization, Stair training, Vestibular training, Canalith repositioning, Orthotic/Fit training, DME instructions, Manual therapy, and Re-evaluation  PLAN FOR NEXT SESSION:  Continue to work on balance. Review freezing/turns. RLE foot clearance tasks. practice gait with RW. trial braces on RLE. Floor transfer. Car transfer, blaze pods dual task    Madalynn Pickelsimer E Homer Miller, PT, DPT 02/11/2023, 11:54 AM

## 2023-02-13 ENCOUNTER — Ambulatory Visit: Payer: Medicare Other | Admitting: Physical Therapy

## 2023-02-13 DIAGNOSIS — R2681 Unsteadiness on feet: Secondary | ICD-10-CM

## 2023-02-13 NOTE — Therapy (Signed)
Patient Name: [name] MRN: [id number] DOB:1945-11-15, 77 y.o., male Today's Date: 02/13/2023   PT End of Session - 02/13/23 1039     Visit Number 24   Arrived no charge   Number of Visits 30   Plus eval   Date for PT Re-Evaluation 03/10/23    Authorization Type [employer] Medicare    Progress Note Due on Visit 10    PT Start Time 1017    PT Stop Time 1025   Arrived no charge   PT Time Calculation (min) 8 min    Activity Tolerance Other (comment)   Nose bleed   Behavior During Therapy [facility] for tasks assessed/performed            Pt arrived to scheduled PT session w/active nose bleed. Wife reports it started in the car. Pt in restroom w/blood on floor, RW bag and his clothes. Provided ice pack on back of pt's head and applied pressure to nose as pt could tolerate. Pt unable to stand w/head in extension due to posterior LOB and discomfort. Pt passed large blood clot and then bleeding did reduce. Pt able to ambulate out of clinic w/tissue in nose.   Aidah Forquer E Daniella Dewberry, PT 02/13/2023, 10:40 AM

## 2023-02-18 ENCOUNTER — Ambulatory Visit: Payer: Medicare Other | Admitting: Physical Therapy

## 2023-02-18 DIAGNOSIS — R2681 Unsteadiness on feet: Secondary | ICD-10-CM | POA: Diagnosis not present

## 2023-02-18 DIAGNOSIS — R2689 Other abnormalities of gait and mobility: Secondary | ICD-10-CM

## 2023-02-18 DIAGNOSIS — M6281 Muscle weakness (generalized): Secondary | ICD-10-CM

## 2023-02-18 NOTE — Therapy (Signed)
OUTPATIENT PHYSICAL THERAPY NEURO TREATMENT Patient Name: [name] MRN: [id number] DOB:1946-02-02, 78 y.o., male Today's Date: 02/18/2023   PCP: Ronnald Nian, MD  REFERRING PROVIDER: Vladimir Faster, DO    END OF SESSION:  PT End of Session - 02/18/23 1106     Visit Number 25    Number of Visits 30   Plus eval   Date for PT Re-Evaluation 03/10/23    Authorization Type [employer] Medicare    Progress Note Due on Visit 10    PT Start Time 1105   Pt arrived late   PT Stop Time 1148    PT Time Calculation (min) 43 min    Activity Tolerance Patient tolerated treatment well    Behavior During Therapy [facility] for tasks assessed/performed;Anxious                     Past Medical History:  Diagnosis Date   ADD (attention deficit disorder)    Allergic rhinitis    skin test 02/23/08 vaccine 2009; allergy shots weekly through Dr. Maple Hudson   Anemia    takes Fe -    Anginal pain (HCC)    Ankylosing spondylitis (HCC)    Anxiety    sees therapist Vernell Leep   Arthritis    Asthma    Dr. 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 Donnie Aho   CHEILECTOMY  10/16/2011   Procedure: CHEILECTOMY;  Surgeon: Sherri Rad, MD;  Location: [facility];  Service: Orthopedics;  Laterality: Right;   CHOLECYSTECTOMY     COLONOSCOPY     CORONARY ANGIOPLASTY     1995 after MI   CORONARY ANGIOPLASTY WITH STENT PLACEMENT     1999    ESOPHAGOGASTRODUODENOSCOPY  02/18/2012   Procedure: ESOPHAGOGASTRODUODENOSCOPY (EGD);  Surgeon: Florencia Reasons, MD;  Location: [facility];  Service: Endoscopy;  Laterality: N/A;   FOOT SURGERY     joint scraped, arthritis right foot   HERNIA REPAIR     LUMBAR DISC SURGERY     x2   right index finger  mass removed- arthritis     Dr. Amanda Pea   [facility]  Right 05/18/2021   medial toe consistent with callus   SKIN BIOPSY Left 05/17/2021   actinic keratosis   SPINAL CORD STIMULATOR BATTERY EXCHANGE N/A 10/11/2021   Procedure: SPINAL CORD STIMULATOR BATTERY EXCHANGE;  Surgeon: Venita Lick, MD;  Location: [facility] OR;  Service: Orthopedics;  Laterality: N/A;  60 mins   SPINAL CORD STIMULATOR INSERTION N/A 11/21/2016   Procedure: LUMBAR SPINAL CORD STIMULATOR INSERTION;  Surgeon: Venita Lick, MD;  Location: [facility];  Service: Orthopedics;  Laterality: N/A;  Requests 2.5 hrs   TONSILLECTOMY     1953   UPPER GI ENDOSCOPY  [id number]   VASECTOMY     1980   Patient Active Problem List   Diagnosis Date Noted   OSA (obstructive sleep apnea) 11/12/2022   Parkinson's disease 11/12/2022   Peripheral vascular disease (HCC) 12/25/2021   Dizziness 12/24/2021   Leg pain, left 11/14/2021   Chronic low back pain with sciatica 11/14/2021   Spinal cord stimulator status 11/14/2021   Muscle spasm 11/14/2021   Constipation 09/11/2021   Slow transit constipation 09/11/2021   Cough 09/11/2021   Early satiety 09/11/2021   Hoarseness 09/11/2021   Iron deficiency 09/11/2021   Personal history of colonic  polyps 09/11/2021   Weight decreased 09/11/2021   Esophageal motility disorder 09/11/2021   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   Pain in both feet 08/10/2021   Atherosclerosis of aorta (HCC) 08/30/2020   Mild concentric left ventricular hypertrophy (LVH)    Hypogonadism male    History of kidney stones    Erectile dysfunction    COPD (chronic obstructive pulmonary disease) (HCC)    Acquired trigger finger of left middle finger 04/22/2019   Acquired trigger finger of right middle finger 04/22/2019   Left ventricular dysfunction 10/12/2018   Ankylosing spondylitis (HCC) 10/09/2018   Presbycusis of both ears 04/23/2018   Mild intermittent asthma without complication 04/23/2018   Chronic pain 11/21/2016   Arthritis 10/15/2016   Spondylolisthesis of lumbar region 02/28/2015   Hyperlipidemia    Lumbar disc disease    Hypertension    CAD (coronary artery disease) 05/19/2012   GERD (gastroesophageal reflux disease)    Depression with anxiety    Chronic asthma 05/17/2011   Allergic rhinitis due to pollen 08/08/2010   History of benign positional vertigo    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 HEP is going okay. Still working on his painting. Is nervous about his appointment tomorrow.   Pt accompanied by:  Wife, [name]  PERTINENT HISTORY: Pt falls frequently, always in forward direction and insists on using back stairs despite frequent  falls over top step. Started Sinemet week of 11/04/22  PAIN:  Are you having pain? No  There were no vitals filed for this visit.   PRECAUTIONS: Fall  WEIGHT BEARING RESTRICTIONS: No  FALLS: Has patient fallen in last 6 months? Yes. Number of falls "more than I can count"  LIVING ENVIRONMENT: Lives with: lives with their spouse Lives in: House/apartment Stairs: Yes: External: 3 in front and 5 in back steps; on right going up, on left going up, can reach both, and always uses back steps (where he has the most falls) Has following equipment at home: Single point cane  PLOF: Independent  PATIENT GOALS: "I want to be in the next Olympics" "I would like to walk with minimal pain and get up and down with no assistance"  OBJECTIVE:   COGNITION: Overall cognitive status: Impaired   SENSATION: Denies numbness/tingling in BUE/BLEs  COORDINATION: Heel to shin test: WNL bilaterally   EDEMA: Frequent swelling in R ankle    POSTURE: rounded shoulders, forward head, and left pelvic obliquity  LOWER EXTREMITY MMT:  Tested in seated position   MMT Right Eval Left Eval  Hip flexion 4+ 5  Hip extension    Hip abduction 4 4  Hip adduction 5 5  Hip internal rotation    Hip external rotation    Knee flexion 5 5  Knee extension 5 5  Ankle dorsiflexion 3 5  Ankle plantarflexion    Ankle inversion    Ankle eversion    (Blank rows = not tested)   TODAY'S TREATMENT:    Ther Act  Discussed what to expect at neuro appointment tomorrow and pt very anxious. Provided therapeutic listening and encouragement as appropriate.  Discussed plan to DC from PT next week and strongly encouraged pt to create a schedule to perform his HEP regularly, as pt has not been consistent with this. Pt verbalized understanding but may need further assistance in future sessions.  Provided handout of [place] community resources sheet and strongly encouraged pt to try out a few classes for continued exercise and  socialization. Pt interested in PWR moves class, so had Chloe discuss this with pt. Pt reports he is a member of the [employer], so discussed exercise options for [facility] at [facility] [employer] as well.    Gait pattern: step through pattern, decreased step length- Right, decreased stride length, decreased hip/knee flexion- Right, decreased ankle dorsiflexion- Right, shuffling, decreased trunk rotation, trunk flexed, and poor foot clearance- Right Distance walked: Various clinic distances   Assistive device utilized: Walker - 2 wheeled Level of assistance: SBA Comments: Pt needing cues for staying closer to RW, heel strike, and tall posture. Increased forward flexed posture this date. Min cues to stay inside walker at all times, especially w/turns.    PATIENT EDUCATION: Education details: Continue HEP, see above Person educated: Patient and Spouse Education method: Explanation, Demonstration, and Verbal cues Education comprehension: verbalized understanding, returned demonstration, verbal cues required, and needs further education  HOME EXERCISE PROGRAM: Walking program, starting with 3-4 minutes daily around the house with focus on tall posture, stride length, and staying close to RW  Seated PWR Up/Step  Standing PWR Rock/Step Supine PWR Up  Access Code:  [email or web address] URL: [email or web address] Date: 01/07/2023 Prepared by: Sherlie Ban  Exercises - Sit to Stand Without Arm Support  - 1 x daily - 7 x weekly - 3 sets - 10 reps - Supine Bridge  - 1 x daily - 7 x weekly - 1-2 sets - 10 reps - Modified Thomas Stretch  - 1 x daily - 7 x weekly - 3-5 minute hold  GOALS: Goals reviewed with patient? Yes  LONG TERM GOALS: Target date: 01/10/2023  Pt will be independent with final HEP for improved strength, balance, transfers and gait.  Baseline: pt reports not doing them as much as he should, reports he needs to do more  Goal status: IN PROGRESS  2.  Pt will improve gait velocity to at least 2.6  ft/s w/LRAD and SBA for improved gait efficiency and independence   Baseline: 1.95 ft/s w/o AD and min A  17.53 seconds = 1.87 ft/sec with RW   Goal status: NOT MET  3.  Pt will improve TUG time to 15 seconds or less with LRAD and supervision  in order to demo decrease fall risk. Baseline: 20 seconds with no AD, CGA  11 seconds with no AD with supervision/CGA  Goal status: MET  4.  Pt will improve 5 x STS to less than or equal to 15 seconds w/good posture and form to demonstrate improved functional strength and transfer efficiency.   Baseline: 15.41s w/BUE support, significant posterior lean and flexed posture   14.1 seconds with no UE support on 01/09/23 with no episodes of retropulsion  Goal status: MET  5.  Pt will trial various bracing options on RLE to reduce foot slap and fall frequency.  Baseline: bracing options not trialed, pt using a RW to decr fall risk.  Goal status: N/A    UPDATED/ONGOING LTGS FOR RE-CERT  LONG TERM GOALS: Target date: 02/25/2023  Pt will be independent with final HEP for improved strength, balance, transfers and gait.  Baseline: pt reports not doing them as much as he should, reports he needs to do more  Goal status: IN PROGRESS  2.  Pt will perform 10 reps of sit <> stands with no UE support and no retropulsion in order to demo improved transfer efficiency.   Baseline: Goal status: INITIAL  3.  Pt will perform car transfer with supervision in order to demo improved functional mobility. Baseline: pt reports difficulty getting in and out of the car; mod I on 9/3  Goal status: MET  4.  Pt will perform floor transfer with CGA for fall recovery strategies  Baseline:  Goal status: INITIAL  5.  Pt will improve with RW to at least 720' with RPE as a 3/10 or less  in order to demo improved gait efficiency/endurance Baseline: 75' with 5/10 RPE Goal status: INTIIAL   ASSESSMENT:  CLINICAL IMPRESSION: Emphasis of skilled PT session on pt  education, reviewing community PD resources and preparing for DC next week.  Pt very anxious about his neurology appointment tomorrow, so provided encouragement as appropriate. Pt in agreement to DC from PT next week but has not been consistent w/his HEP at home. Encouraged pt to create exercise schedule for himself to improve compliance at home, pt verbalized understanding. Pt interested in PD-specific exercise classes, so provided handout of PD community resources for pt to review. Continue POC.     OBJECTIVE IMPAIRMENTS: Abnormal gait, decreased balance, decreased cognition, decreased coordination, decreased knowledge of condition, decreased knowledge of use of DME, decreased  mobility, difficulty walking, decreased strength, decreased safety awareness, impaired perceived functional ability, improper body mechanics, postural dysfunction, and pain  ACTIVITY LIMITATIONS: carrying, lifting, bending, standing, squatting, stairs, transfers, bed mobility, reach over head, locomotion level, and caring for others  PARTICIPATION LIMITATIONS: medication management, interpersonal relationship, driving, shopping, community activity, and yard work  PERSONAL FACTORS: Age, Behavior pattern, Fitness, Past/current experiences, and 1 comorbidity: Vascular parkinsonism  are also affecting patient's functional outcome.   REHAB POTENTIAL: Fair due to diagnosis   CLINICAL DECISION MAKING: Evolving/moderate complexity  EVALUATION COMPLEXITY: Moderate  PLAN:  PT FREQUENCY: 2x/week  PT DURATION: 8 weeks  PLANNED INTERVENTIONS: Therapeutic exercises, Therapeutic activity, Neuromuscular re-education, Balance training, Gait training, Patient/Family education, Self Care, Joint mobilization, Stair training, Vestibular training, Canalith repositioning, Orthotic/Fit training, DME instructions, Manual therapy, and Re-evaluation  PLAN FOR NEXT SESSION: Review PWR moves to prepare for PWR class. Help pt build exercise  schedule. How was neuro appointment?   Continue to work on balance. Review freezing/turns. RLE foot clearance tasks. practice gait with RW. trial braces on RLE. Floor transfer. Car transfer, blaze pods dual task    Rashena Dowling E Dorthie Santini, PT, DPT 02/18/2023, 11:58 AM

## 2023-02-19 ENCOUNTER — Ambulatory Visit: Payer: Medicare Other

## 2023-02-19 ENCOUNTER — Encounter: Payer: Self-pay | Admitting: Physician Assistant

## 2023-02-19 ENCOUNTER — Ambulatory Visit (INDEPENDENT_AMBULATORY_CARE_PROVIDER_SITE_OTHER): Payer: Medicare Other | Admitting: Physician Assistant

## 2023-02-19 ENCOUNTER — Other Ambulatory Visit (INDEPENDENT_AMBULATORY_CARE_PROVIDER_SITE_OTHER): Payer: Medicare Other

## 2023-02-19 VITALS — BP 103/65 | HR 76 | Resp 20 | Ht 66.0 in | Wt 162.0 lb

## 2023-02-19 DIAGNOSIS — G214 Vascular parkinsonism: Secondary | ICD-10-CM | POA: Diagnosis not present

## 2023-02-19 DIAGNOSIS — R413 Other amnesia: Secondary | ICD-10-CM

## 2023-02-19 LAB — TSH: TSH: 0.69 u[IU]/mL (ref 0.35–5.50)

## 2023-02-19 LAB — VITAMIN B12: Vitamin B-12: 417 pg/mL (ref 211–911)

## 2023-02-19 NOTE — Progress Notes (Signed)
Assessment/Plan:     [name] is a very pleasant 77 y.o. year old RH male with a history of hypertension, hyperlipidemia, ADD, anxiety, arthritis, iron deficiency anemia, GERD, insomnia ,OSA on CPAP, PVD, CAD, COPD, vascular parkinsonism followed by Dr. Arbutus Leas, chronic benign positional vertigo on meclizine seen today for evaluation of memory loss. MoCA today is 22/30.  Most recent CT of the head from April 2024, personally reviewed, was remarkable for global left greater than right mild atrophy mild chronic ischemic changes, no intracranial abnormalities.  Although it may be a component of medication induced memory difficulty, changes are also concerning for mild cognitive impairment likely of multiple etiologies listed above.  Because he spinal stimulator is not MRI compatible, will not be able to more and more detailed imaging.  Other workup is in progress for clarity of the diagnosis.  Patient is able to participate on his IADLs and to drive without difficulty.   Memory Impairment, likely of multiple etiologies.  Neurocognitive testing to further evaluate cognitive concerns and determine other underlying cause of memory changes, including potential contribution from sleep, anxiety, or depression  Patient is to entertain initiating antidementia medication such as Aricept with goal of slowing down any memory decline.  He politely declines during this visit. Check B12, TSH Continue CPAP for OSA Recommend good control of cardiovascular risk factors.   Continue to control mood as per PCP, recommend resuming psychotherapy for anxiety and ADD. Folllow up in 3 months  Vascular parkinsonism, stable Tremors are well-controlled with current regimen ,no new parkinsonian signs are noted.  Followed at the movement disorder clinic. Continue carbidopa levodopa 25-100, 1 tablet 3 times daily at 8 AM-12 PM-4 PM Continue physical therapy, speech therapy Follow-up with Dr. Arbutus Leas December  2024  Subjective:    The patient is accompanied by his wife who supplements the history.    How long did patient have memory difficulties? For the last year. Wife initially thought this was due to ADD, but changes became more evident over the last year. Patient has some difficulty remembering recent conversations and people names.  He did post cards for his family reunion and he was "stuck on the same subject". He had speech difficulties which had included dysarthria with some hypophonia, but after attending speech therapy sessions, this improved significantly.  He loves crossword puzzles, reading, crime TV.  repeats oneself?  Endorsed, especially with appointments.  Disoriented when walking into a room?  Patient denies    Leaving objects in unusual places?  Denies. Occasionally he misplaces the cell or keys but not in unusual places.   Wandering behavior? Denies.   Any personality changes ? Denies. "Thinking if a task, if I cannot complete it may irritate me". My voice, hoarseness does not let me sing well at [facility] and causes me some stress" Any history of depression?: "Occasionally I am depressed, more like anxiety" Hallucinations or paranoia?  "Sometimes I see something moving and nothing is there, but it may be floaters" "He has been hearing things , such as mice in the attic, even exterminators came in and there was nothing there". Seizures? Denies.    Any sleep changes?  Sleeps better with the CPAP. Reports vivid dreams not that often since using the CPAP, in the past he was doing shadowboxing but not recently. Denies sleepwalking   Any hygiene concerns?  Denies.   Independent of bathing and dressing? Endorsed  Does the patient need help with medications? Wife is in charge because  he was missing pills because "it was too much trouble"   Who is in charge of the finances? Wife  is in charge     Any changes in appetite? Not as much as before, "sometimes I  am not hungry ".  Tries to drink  more water because of dry mouth.    Patient have trouble swallowing? In the past he did, but after ST this has improved.  Does the patient cook? Yes, denies any accidents  Any headaches?  Denies.   Chronic pain? He has arthritis with CBP and has a back stimulator and injections, last 1 week ago (Dr. Ethelene Hal)   Ambulates with difficulty?  He has been seen at our movement disorder clinic after noticing some difficulty with ambulation, with chronic change gait forward leaning trunk, as well as intermittent tremors.  After evaluation, it was felt that have vascular parkinsonism.  He denies any significant changes since his last visit.  He slowly moving forward, but his tremors are well-controlled. recent falls or head injuries? Denies after PT . He now uses a walker since 11/2022 for stability      Vision changes? He reports floaters, has regular checkups.Denies double vision Unilateral weakness, numbness or tingling? Denies.   Any anosmia? Endorsed, for at least 1 year Any incontinence of urine? Denies.    Any bowel dysfunction? Denies.  Patient lives with wife   History of heavy alcohol intake? denies   History of heavy tobacco use? denies   Family history of dementia? Both with dementia ?type Does patient drive? Yes, denies getting lost  Retired Psychologist, sport and exercise at [employer], retired 2004    Allergies  Allergen Reactions   Oxycodone Nausea And Vomiting and Other (See Comments)    Current Outpatient Medications  Medication Instructions   acetaminophen (TYLENOL) 1,000 mg, Oral, Every 6 hours PRN   albuterol (PROAIR HFA) 108 (90 Base) MCG/ACT inhaler 2 puffs, Inhalation, Every 6 hours PRN   ALPRAZolam (XANAX) 0.25 MG tablet TAKE 1 TABLET BY MOUTH 2 TIMES DAILY AS NEEDED.   amoxicillin (AMOXIL) 500 MG capsule TAKE 4 CAPSULES BY MOUTH 1 HOUR PRIOR TO DENTAL APPOINTMENT   atorvastatin (LIPITOR) 20 mg, Oral, Daily   azelastine (ASTELIN) 0.1 % nasal spray 2 sprays, Each Nare, 2 times  daily, Use in each nostril as directed   buPROPion (WELLBUTRIN XL) 300 mg, Oral, Daily   calcium carbonate (OS-CAL) 600 mg, Oral, Daily with breakfast   carbidopa-levodopa (SINEMET IR) 25-100 MG tablet 1 tablet, Oral, 3 times daily, 8am/noon/4pm   cetirizine (ZYRTEC) 10 mg, Oral, Daily   clopidogrel (PLAVIX) 75 mg, Oral, Daily   desvenlafaxine (PRISTIQ) 50 mg, Oral, Daily   diltiazem (CARDIZEM CD) 240 mg, Oral, Daily   ferrous sulfate 325 mg, Oral, Daily   Flax Seed Oil 1,000 mg, Oral, Every 7 days   GLUCOSAMINE CHONDROITIN COMPLX PO 1 tablet, Oral, Daily   guaiFENesin (MUCINEX) 600 mg, 2 times daily PRN   meclizine (ANTIVERT) 25 MG tablet 1 twice a day as needed for dizziness   methylphenidate (CONCERTA) 54 mg, Oral, BH-each morning   Multiple Vitamins-Minerals (MULTIVITAMIN WITH MINERALS) tablet 1 tablet, Oral, Daily   pantoprazole (PROTONIX) 40 mg, Oral, Daily   sucralfate (CARAFATE) 1 g, Oral, Daily   umeclidinium-vilanterol (ANORO ELLIPTA) 62.5-25 MCG/ACT AEPB 1 puff, Inhalation, Daily     VITALS:   Vitals:   02/19/23 0750  BP: 103/65  Pulse: 76  Resp: 20  SpO2: 95%  Weight: 162  lb (73.5 kg)  Height: 5\' 6"  (1.676 m)    PHYSICAL EXAM   HEENT:  Normocephalic, atraumatic.  The superficial temporal arteries are without ropiness or tenderness. Cardiovascular: Regular rate and rhythm. Lungs: Clear to auscultation bilaterally. Neck: There are no carotid bruits noted bilaterally.  NEUROLOGICAL:    02/19/2023   11:00 AM  Montreal Cognitive Assessment   Visuospatial/ Executive (0/5) 4  Naming (0/3) 3  Attention: Read list of digits (0/2) 2  Attention: Read list of letters (0/1) 1  Attention: Serial 7 subtraction starting at 100 (0/3) 1  Language: Repeat phrase (0/2) 2  Language : Fluency (0/1) 0  Abstraction (0/2) 1  Delayed Recall (0/5) 3  Orientation (0/6) 5  Total 22  Adjusted Score (based on education) 22       01/01/2023   10:22 AM  MMSE - Mini Mental State  Exam  Orientation to time 5  Orientation to Place 3  Registration 3  Attention/ Calculation 4  Recall 3  Language- name 2 objects 2  Language- repeat 1  Language- follow 3 step command 3  Language- read & follow direction 1  Write a sentence 1  Copy design 1  Total score 27     Orientation:  Alert and oriented to person, place and not to date . No aphasia or dysarthria. Fund of knowledge is appropriate. Recent memory impaired, remote memory normal . Attention and concentration are mildly decreased.  Able to name objects and repeat phrases. Delayed recall 0/3 Cranial nerves: There is good facial symmetry. Extraocular muscles are intact and visual fields are full to confrontational testing. Speech is fluent and clear. No tongue deviation. Hearing is intact to conversational tone.  Tone: Tone is good throughout. Sensation: Sensation is intact to light touch.  Vibration is intact at the bilateral big toe.  Coordination: The patient has no difficulty with RAM's or FNF bilaterally. Normal finger to nose  Motor: Strength is 5/5 in the bilateral upper and lower extremities. There is no pronator drift. There are no fasciculations noted. DTR's: Deep tendon reflexes are 2/4 bilaterally. Gait and Station: The patient is able to ambulate without difficulty, uses a walker for stability.[name]  He flexes forward.  Gait is cautious and narrow. Stride length is normal       Thank you for allowing [place] the opportunity to participate in the care of this nice patient. Please do not hesitate to contact us for any questions or concerns.   Total time spent on today's visit was 56 minutes dedicated to this patient today, preparing to see patient, examining the patient, ordering tests and/or medications and counseling the patient, documenting clinical information in the EHR or other health record, independently interpreting results and communicating results to the patient/family, discussing treatment and goals,  answering patient's questions and coordinating care.  Cc:  Ronnald Nian, MD  [name] 02/19/2023 11:56 AM

## 2023-02-19 NOTE — Progress Notes (Signed)
B12 in the ow normal, make sure you replenish with B12 1000 micrograms daily, thyroid is normal, thank you

## 2023-02-19 NOTE — Patient Instructions (Addendum)
It was a pleasure to see you today at our office.   Recommendations:  Neurocognitive evaluation at our office   Check labs today   Recommend follow up for anxiety and ADD Follow up in Dec 17 at 1 pm Follow up w Dr. Arbutus Leas as schedule   For psychiatric meds, mood meds: Please have your primary care physician manage these medications.  If you have any severe symptoms of a stroke, or other severe issues such as confusion,severe chills or fever, etc call 911 or go to the ER as you may need to be evaluated further  For guidance regarding [facility] and if placement were needed at the facility, contact Social Worker tel: [phone]  For assessment of decision of mental capacity and competency:  Call Dr. Erick Blinks, geriatric psychiatrist at [phone]  Counseling regarding caregiver distress, including caregiver depression, anxiety and issues regarding community resources, adult day care programs, adult living facilities, or memory care questions:  please contact your  Primary Doctor's Social Worker   Whom to call: Memory  decline, memory medications: Call our office [phone]    [email or web address]   RECOMMENDATIONS FOR ALL PATIENTS WITH MEMORY PROBLEMS: 1. Continue to exercise (Recommend 30 minutes of walking everyday, or 3 hours every week) 2. Increase social interactions - continue going to [place] and enjoy social gatherings with friends and family 3. Eat healthy, avoid fried foods and eat more fruits and vegetables 4. Maintain adequate blood pressure, blood sugar, and blood cholesterol level. Reducing the risk of stroke and cardiovascular disease also helps promoting better memory. 5. Avoid stressful situations. Live a simple life and avoid aggravations. Organize your time and prepare for the next day in anticipation. 6. Sleep well, avoid any interruptions of sleep and avoid any distractions in the  bedroom that may interfere with adequate sleep quality 7. Avoid sugar, avoid sweets as there is a strong link between excessive sugar intake, diabetes, and cognitive impairment We discussed the Mediterranean diet, which has been shown to help patients reduce the risk of progressive memory disorders and reduces cardiovascular risk. This includes eating fish, eat fruits and green leafy vegetables, nuts like almonds and hazelnuts, walnuts, and also use olive oil. Avoid fast foods and fried foods as much as possible. Avoid sweets and sugar as sugar use has been linked to worsening of memory function.  There is always a concern of gradual progression of memory problems. If this is the case, then we may need to adjust level of care according to patient needs. Support, both to the patient and caregiver, should then be put into place.      You have been referred for a neuropsychological evaluation (i.e., evaluation of memory and thinking abilities). Please bring someone with you to this appointment if possible, as it is helpful for the doctor to hear from both you and another adult who knows you well. Please bring eyeglasses and hearing aids if you wear them.    The evaluation will take approximately 3 hours and has two parts:   The first part is a clinical interview with the neuropsychologist (Dr. Milbert Coulter or Dr. Roseanne Reno). During the interview, the neuropsychologist will speak with you and the individual you brought to the appointment.    The second part of the evaluation is testing with the doctor's technician Annabelle Harman or Selena Batten). During the testing, the technician will ask you to remember different types of material, solve problems, and answer some questionnaires. Your family member will not be present for  this portion of the evaluation.   Please note: We must reserve several hours of the neuropsychologist's time and the psychometrician's time for your evaluation appointment. As such, there is a No-Show fee of  $100. If you are unable to attend any of your appointments, please contact our office as soon as possible to reschedule.      DRIVING: Regarding driving, in patients with progressive memory problems, driving will be impaired. We advise to have someone else do the driving if trouble finding directions or if minor accidents are reported. Independent driving assessment is available to determine safety of driving.   If you are interested in the driving assessment, you can contact the following:  [facility] in [place] [phone]  [facility] [phone]  [facility] [phone]  [facility] [phone] or [phone]   FALL PRECAUTIONS: Be cautious when walking. Scan the area for obstacles that may increase the risk of trips and falls. When getting up in the mornings, sit up at the edge of the bed for a few minutes before getting out of bed. Consider elevating the bed at the head end to avoid drop of blood pressure when getting up. Walk always in a well-lit room (use night lights in the walls). Avoid area rugs or power cords from appliances in the middle of the walkways. Use a walker or a cane if necessary and consider physical therapy for balance exercise. Get your eyesight checked regularly.  FINANCIAL OVERSIGHT: Supervision, especially oversight when making financial decisions or transactions is also recommended.  HOME SAFETY: Consider the safety of the kitchen when operating appliances like stoves, microwave oven, and blender. Consider having supervision and share cooking responsibilities until no longer able to participate in those. Accidents with firearms and other hazards in the house should be identified and addressed as well.   ABILITY TO BE LEFT ALONE: If patient is unable to contact 911 operator, consider using LifeLine, or when the need is there, arrange for someone to stay with patients. Smoking is a fire hazard, consider  supervision or cessation. Risk of wandering should be assessed by caregiver and if detected at any point, supervision and safe proof recommendations should be instituted.  MEDICATION SUPERVISION: Inability to self-administer medication needs to be constantly addressed. Implement a mechanism to ensure safe administration of the medications.      Mediterranean Diet A Mediterranean diet refers to food and lifestyle choices that are based on the traditions of countries located on the [employer]. This way of eating has been shown to help prevent certain conditions and improve outcomes for people who have chronic diseases, like kidney disease and heart disease. What are tips for following this plan? Lifestyle  Cook and eat meals together with your family, when possible. Drink enough fluid to keep your urine clear or pale yellow. Be physically active every day. This includes: Aerobic exercise like running or swimming. Leisure activities like gardening, walking, or housework. Get 7-8 hours of sleep each night. If recommended by your health care provider, drink red wine in moderation. This means 1 glass a day for nonpregnant women and 2 glasses a day for men. A glass of wine equals 5 oz (150 mL). Reading food labels  Check the serving size of packaged foods. For foods such as rice and pasta, the serving size refers to the amount of cooked product, not dry. Check the total fat in packaged foods. Avoid foods that have saturated fat or trans fats. Check the ingredients list for  added sugars, such as corn syrup. Shopping  At the grocery store, buy most of your food from the areas near the walls of the store. This includes: Fresh fruits and vegetables (produce). Grains, beans, nuts, and seeds. Some of these may be available in unpackaged forms or large amounts (in bulk). Fresh seafood. Poultry and eggs. Low-fat dairy products. Buy whole ingredients instead of prepackaged foods. Buy fresh  fruits and vegetables in-season from local farmers markets. Buy frozen fruits and vegetables in resealable bags. If you do not have access to quality fresh seafood, buy precooked frozen shrimp or canned fish, such as tuna, salmon, or sardines. Buy small amounts of raw or cooked vegetables, salads, or olives from the deli or salad bar at your store. Stock your pantry so you always have certain foods on hand, such as olive oil, canned tuna, canned tomatoes, rice, pasta, and beans. Cooking  Cook foods with extra-virgin olive oil instead of using butter or other vegetable oils. Have meat as a side dish, and have vegetables or grains as your main dish. This means having meat in small portions or adding small amounts of meat to foods like pasta or stew. Use beans or vegetables instead of meat in common dishes like chili or lasagna. Experiment with different cooking methods. Try roasting or broiling vegetables instead of steaming or sauteing them. Add frozen vegetables to soups, stews, pasta, or rice. Add nuts or seeds for added healthy fat at each meal. You can add these to yogurt, salads, or vegetable dishes. Marinate fish or vegetables using olive oil, lemon juice, garlic, and fresh herbs. Meal planning  Plan to eat 1 vegetarian meal one day each week. Try to work up to 2 vegetarian meals, if possible. Eat seafood 2 or more times a week. Have healthy snacks readily available, such as: Vegetable sticks with hummus. Greek yogurt. Fruit and nut trail mix. Eat balanced meals throughout the week. This includes: Fruit: 2-3 servings a day Vegetables: 4-5 servings a day Low-fat dairy: 2 servings a day Fish, poultry, or lean meat: 1 serving a day Beans and legumes: 2 or more servings a week Nuts and seeds: 1-2 servings a day Whole grains: 6-8 servings a day Extra-virgin olive oil: 3-4 servings a day Limit red meat and sweets to only a few servings a month What are my food choices? Mediterranean  diet Recommended Grains: Whole-grain pasta. Brown rice. Bulgar wheat. Polenta. Couscous. Whole-wheat bread. Orpah Cobb. Vegetables: Artichokes. Beets. Broccoli. Cabbage. Carrots. Eggplant. Green beans. Chard. Kale. Spinach. Onions. Leeks. Peas. Squash. Tomatoes. Peppers. Radishes. Fruits: Apples. Apricots. Avocado. Berries. Bananas. Cherries. Dates. Figs. Grapes. Lemons. Melon. Oranges. Peaches. Plums. Pomegranate. Meats and other protein foods: Beans. Almonds. Sunflower seeds. Pine nuts. Peanuts. Cod. Salmon. Scallops. Shrimp. Tuna. Tilapia. Clams. Oysters. Eggs. Dairy: Low-fat milk. Cheese. Greek yogurt. Beverages: Water. Red wine. Herbal tea. Fats and oils: Extra virgin olive oil. Avocado oil. Grape seed oil. Sweets and desserts: [place] yogurt with honey. Baked apples. Poached pears. Trail mix. Seasoning and other foods: Basil. Cilantro. Coriander. Cumin. Mint. Parsley. Sage. Rosemary. Tarragon. Garlic. Oregano. Thyme. Pepper. Balsalmic vinegar. Tahini. Hummus. Tomato sauce. Olives. Mushrooms. Limit these Grains: Prepackaged pasta or rice dishes. Prepackaged cereal with added sugar. Vegetables: Deep fried potatoes (french fries). Fruits: Fruit canned in syrup. Meats and other protein foods: Beef. Pork. Lamb. Poultry with skin. Hot dogs. Tomasa Blase. Dairy: Ice cream. Sour cream. Whole milk. Beverages: Juice. Sugar-sweetened soft drinks. Beer. Liquor and spirits. Fats and oils: Butter. Canola oil. Vegetable oil.  Beef fat (tallow). Lard. Sweets and desserts: Cookies. Cakes. Pies. Candy. Seasoning and other foods: Mayonnaise. Premade sauces and marinades. The items listed may not be a complete list. Talk with your dietitian about what dietary choices are right for you. Summary The Mediterranean diet includes both food and lifestyle choices. Eat a variety of fresh fruits and vegetables, beans, nuts, seeds, and whole grains. Limit the amount of red meat and sweets that you eat. Talk with your  health care provider about whether it is safe for you to drink red wine in moderation. This means 1 glass a day for nonpregnant women and 2 glasses a day for men. A glass of wine equals 5 oz (150 mL). This information is not intended to replace advice given to you by your health care provider. Make sure you discuss any questions you have with your health care provider. Document Released: 01/18/2016 Document Revised: 02/20/2016 Document Reviewed: 01/18/2016 Elsevier Interactive Patient Education  2017 [employer].   [address]

## 2023-02-20 ENCOUNTER — Encounter: Payer: Self-pay | Admitting: Physical Therapy

## 2023-02-20 ENCOUNTER — Ambulatory Visit: Payer: Medicare Other | Admitting: Physical Therapy

## 2023-02-20 DIAGNOSIS — R2689 Other abnormalities of gait and mobility: Secondary | ICD-10-CM

## 2023-02-20 DIAGNOSIS — R2681 Unsteadiness on feet: Secondary | ICD-10-CM | POA: Diagnosis not present

## 2023-02-20 DIAGNOSIS — M6281 Muscle weakness (generalized): Secondary | ICD-10-CM

## 2023-02-20 NOTE — Therapy (Signed)
OUTPATIENT PHYSICAL THERAPY NEURO TREATMENT Patient Name: [name] MRN: [id number] DOB:1945-10-26, 77 y.o., male Today's Date: 02/21/2023   PCP: Dwayne Nian, MD  REFERRING PROVIDER: Vladimir Faster, DO    END OF SESSION:  PT End of Session - 02/20/23 1104     Visit Number 26    Number of Visits 30   Plus eval   Date for PT Re-Evaluation 03/10/23    Authorization Type [employer]    Progress Note Due on Visit 10    PT Start Time 1102    PT Stop Time 1144    PT Time Calculation (min) 42 min    Activity Tolerance Patient tolerated treatment well    Behavior During Therapy WFL for tasks assessed/performed;Anxious                     Past Medical History:  Diagnosis Date   ADD (attention deficit disorder)    Allergic rhinitis    skin test 02/23/08 vaccine 2009; allergy shots weekly through Dr. Maple Patterson   Anemia    takes Fe -    Anginal pain (HCC)    Ankylosing spondylitis (HCC)    Anxiety    sees therapist [name]   Arthritis    Asthma    Dr. 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 Donnie Patterson   CHEILECTOMY  10/16/2011   Procedure: CHEILECTOMY;  Surgeon: Dwayne Rad, MD;  Location: [facility];  Service: Orthopedics;  Laterality: Right;   CHOLECYSTECTOMY     COLONOSCOPY     CORONARY ANGIOPLASTY     1995 after MI   CORONARY ANGIOPLASTY WITH STENT PLACEMENT     1999    ESOPHAGOGASTRODUODENOSCOPY  02/18/2012   Procedure: ESOPHAGOGASTRODUODENOSCOPY (EGD);  Surgeon: Dwayne Reasons, MD;  Location: [facility];  Service: Endoscopy;  Laterality: N/A;   FOOT SURGERY     joint scraped, arthritis right foot   HERNIA REPAIR     LUMBAR DISC SURGERY     x2   right index finger  mass removed- arthritis     Dr. Amanda Patterson   [facility]  Right 05/18/2021   medial toe consistent with callus   SKIN BIOPSY Left 05/17/2021   actinic keratosis   SPINAL CORD STIMULATOR BATTERY EXCHANGE N/A 10/11/2021   Procedure: SPINAL CORD STIMULATOR BATTERY EXCHANGE;  Surgeon: Dwayne Lick, MD;  Location: [facility];  Service: Orthopedics;  Laterality: N/A;  60 mins   SPINAL CORD STIMULATOR INSERTION N/A 11/21/2016   Procedure: LUMBAR SPINAL CORD STIMULATOR INSERTION;  Surgeon: Dwayne Lick, MD;  Location: [facility];  Service: Orthopedics;  Laterality: N/A;  Requests 2.5 hrs   TONSILLECTOMY     1953   UPPER GI ENDOSCOPY  [id number]   VASECTOMY     1980   Patient Active Problem List   Diagnosis Date Noted   OSA (obstructive sleep apnea) 11/12/2022   Parkinson's disease 11/12/2022   Peripheral vascular disease (HCC) 12/25/2021   Dizziness 12/24/2021   Leg pain, left 11/14/2021   Chronic low back pain with sciatica 11/14/2021   Spinal cord stimulator status 11/14/2021   Muscle spasm 11/14/2021   Constipation 09/11/2021   Slow transit constipation 09/11/2021   Cough 09/11/2021   Early satiety 09/11/2021   Hoarseness 09/11/2021   Iron deficiency 09/11/2021   Personal history of colonic polyps 09/11/2021    Weight decreased 09/11/2021   Esophageal motility disorder 09/11/2021   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   Pain in both feet 08/10/2021   Atherosclerosis of aorta (HCC) 08/30/2020   Mild concentric left ventricular hypertrophy (LVH)    Hypogonadism male    History of kidney stones    Erectile dysfunction    COPD (chronic obstructive pulmonary disease) (HCC)    Acquired trigger finger of left middle finger 04/22/2019   Acquired trigger finger of right middle finger 04/22/2019   Left ventricular dysfunction 10/12/2018   Ankylosing spondylitis (HCC) 10/09/2018   Presbycusis of both ears 04/23/2018   Mild intermittent asthma without complication 04/23/2018   Chronic pain 11/21/2016   Arthritis 10/15/2016   Spondylolisthesis of lumbar region 02/28/2015   Hyperlipidemia    Lumbar disc disease    Hypertension    CAD (coronary artery disease) 05/19/2012   GERD (gastroesophageal reflux disease)    Depression with anxiety    Chronic asthma 05/17/2011   Allergic rhinitis due to pollen 08/08/2010   History of benign positional vertigo    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 Saw Dwayne Kays PA-C  yesterday. Reports she mad him feel encouraged. Got blood work done for his thyroid and vitamin B12 and it was normal. Got referred to neuropsych  Pt accompanied by:  Wife, [name]  PERTINENT HISTORY: Pt falls frequently, always in forward direction and insists on using back stairs despite  frequent falls over top step. Started Sinemet week of 11/04/22  PAIN:  Are you having pain? No  There were no vitals filed for this visit.   PRECAUTIONS: Fall  WEIGHT BEARING RESTRICTIONS: No  FALLS: Has patient fallen in last 6 months? Yes. Number of falls "more than I can count"  LIVING ENVIRONMENT: Lives with: lives with their spouse Lives in: House/apartment Stairs: Yes: External: 3 in front and 5 in back steps; on right going up, on left going up, can reach both, and always uses back steps (where he has the most falls) Has following equipment at home: Single point cane  PLOF: Independent  PATIENT GOALS: "I want to be in the next Olympics" "I would like to walk with minimal pain and get up and down with no assistance"  OBJECTIVE:   COGNITION: Overall cognitive status: Impaired   SENSATION: Denies numbness/tingling in BUE/BLEs  COORDINATION: Heel to shin test: WNL bilaterally   EDEMA: Frequent swelling in R ankle    POSTURE: rounded shoulders, forward head, and left pelvic obliquity  LOWER EXTREMITY MMT:  Tested in seated position   MMT Right Eval Left Eval  Hip flexion 4+ 5  Hip extension    Hip abduction 4 4  Hip adduction 5 5  Hip internal rotation    Hip external rotation    Knee flexion 5 5  Knee extension 5 5  Ankle dorsiflexion 3 5  Ankle plantarflexion    Ankle inversion    Ankle eversion    (Blank rows = not tested)   TODAY'S TREATMENT:    Ther Act  Gave information about PD symposium coming up in October, reviewed community PD resources and gave pt/pt's spouse Cletis Athens email to reach out about Atypical PD support group for vascular Parkinsonism  Gave information about optimal exercise for PD after D/C and reviewed with pt/pt's spouse - working on PD specific exercises, waking program, and aerobic exercise (like using the stepper or recumbent bike at the gym) Discussed return PD evals vs. Screens, with pt and pt's spouse in agreement  to schedule for screens  Began to review HEP (see below for PWR moves), educated on continued importance of movement and exercise:  Pt performs PWR! Moves in sitting position:    PWR! Up for improved posture x10 reps   PWR! Rock for improved weighshifting x10 reps each side   PWR! Twist for improved trunk rotation x10 reps  PWR! Step for improved step initiation x10 reps each side, single step out and in  Cues provided for larger amplitude movement patterns and working up to a 7/10 RPE     Access Code: [email or web address] URL: [email or web address] Date: 02/21/2023 Prepared by: Sherlie Ban  Reviewed bolded below, for standing PWR moves   Exercises - Sit to Stand Without Arm Support  - 1 x daily - 7 x weekly - 3 sets - 10 reps - Supine Bridge  - 1 x daily - 7 x weekly - 1-2 sets - 10 reps - Modified Thomas Stretch  - 1 x daily - 7 x weekly - 3-5 minute hold - Side to Side Weight Shift with Overhead Reach and Counter Support  - 1 x daily - 5  x weekly - 1-2 sets - 10 reps - Step Sideways with Arms Reaching  - 1 x daily - 5 x weekly - 1-2 sets - 10 reps - cues with incr foot clearance when stepping, esp with LLE    Gait pattern: step through pattern, decreased step length- Right, decreased stride length, decreased hip/knee flexion- Right, decreased ankle dorsiflexion- Right, shuffling, decreased trunk rotation, trunk flexed, and poor foot clearance- Right Distance walked: Various clinic distances   Assistive device utilized: Walker - 2 wheeled Level of assistance: SBA Comments: Pt needing cues for staying closer to RW, heel strike, and tall posture. Increased forward flexed posture this date. Min cues to stay inside walker at all times, especially w/turns.    PATIENT EDUCATION: Education details: See therapeutic activity section above, review of HEP  Person educated: Patient and Spouse Education method: Explanation, Demonstration, and Verbal cues Education comprehension:  verbalized understanding, returned demonstration, verbal cues required, and needs further education  HOME EXERCISE PROGRAM: Walking program, starting with 3-4 minutes daily around the house with focus on tall posture, stride length, and staying close to RW   Seated PWR Moves Supine PWR Up  Access Code: [email or web address] URL: [email or web address] Date: 02/21/2023 Prepared by: Sherlie Ban  Exercises - Sit to Stand Without Arm Support  - 1 x daily - 7 x weekly - 3 sets - 10 reps - Supine Bridge  - 1 x daily - 7 x weekly - 1-2 sets - 10 reps - Modified Thomas Stretch  - 1 x daily - 7 x weekly - 3-5 minute hold - Side to Side Weight Shift with Overhead Reach and Counter Support  - 1 x daily - 5 x weekly - 1-2 sets - 10 reps - Step Sideways with Arms Reaching  - 1 x daily - 5 x weekly - 1-2 sets - 10 reps  GOALS: Goals reviewed with patient? Yes  LONG TERM GOALS: Target date: 01/10/2023  Pt will be independent with final HEP for improved strength, balance, transfers and gait.  Baseline: pt reports not doing them as much as he should, reports he needs to do more  Goal status: IN PROGRESS  2.  Pt will improve gait velocity to at least 2.6 ft/s w/LRAD and SBA for improved gait efficiency and independence   Baseline: 1.95 ft/s w/o AD and min A  17.53 seconds = 1.87 ft/sec with RW   Goal status: NOT MET  3.  Pt will improve TUG time to 15 seconds or less with LRAD and supervision  in order to demo decrease fall risk. Baseline: 20 seconds with no AD, CGA  11 seconds with no AD with supervision/CGA  Goal status: MET  4.  Pt will improve 5 x STS to less than or equal to 15 seconds w/good posture and form to demonstrate improved functional strength and transfer efficiency.   Baseline: 15.41s w/BUE support, significant posterior lean and flexed posture   14.1 seconds with no UE support on 01/09/23 with no episodes of retropulsion  Goal status: MET  5.  Pt will trial various  bracing options on RLE to reduce foot slap and fall frequency.  Baseline: bracing options not trialed, pt using a RW to decr fall risk.  Goal status: N/A    UPDATED/ONGOING LTGS FOR RE-CERT  LONG TERM GOALS: Target date: 02/25/2023  Pt will be independent with final HEP for improved strength, balance, transfers and gait.  Baseline: pt reports not doing them as much as he should, reports he  needs to do more  Goal status: IN PROGRESS  2.  Pt will perform 10 reps of sit <> stands with no UE support and no retropulsion in order to demo improved transfer efficiency.   Baseline: Goal status: INITIAL  3.  Pt will perform car transfer with supervision in order to demo improved functional mobility. Baseline: pt reports difficulty getting in and out of the car; mod I on 9/3  Goal status: MET  4.  Pt will perform floor transfer with CGA for fall recovery strategies  Baseline:  Goal status: INITIAL  5.  Pt will improve with RW to at least 720' with RPE as a 3/10 or less  in order to demo improved gait efficiency/endurance Baseline: 68' with 5/10 RPE Goal status: INTIIAL   ASSESSMENT:  CLINICAL IMPRESSION: Today's skilled session focused on reviewing HEP for PWR moves, pt education, and going over community PD resources and optimal exercise program for PD after D/C. Educated on importance of continued exercise with PD. Plan to D/C at next therapy appt with pt and pt's spouse in agreement with plan and discussed getting scheduled for PD screens in 6 months. Educated if pt notes any decline/worsening, then can get a referral from Dr. Arbutus Leas to return sooner. Will continue per POC.     OBJECTIVE IMPAIRMENTS: Abnormal gait, decreased balance, decreased cognition, decreased coordination, decreased knowledge of condition, decreased knowledge of use of DME, decreased mobility, difficulty walking, decreased strength, decreased safety awareness, impaired perceived functional ability, improper body  mechanics, postural dysfunction, and pain  ACTIVITY LIMITATIONS: carrying, lifting, bending, standing, squatting, stairs, transfers, bed mobility, reach over head, locomotion level, and caring for others  PARTICIPATION LIMITATIONS: medication management, interpersonal relationship, driving, shopping, community activity, and yard work  PERSONAL FACTORS: Age, Behavior pattern, Fitness, Past/current experiences, and 1 comorbidity: Vascular parkinsonism  are also affecting patient's functional outcome.   REHAB POTENTIAL: Fair due to diagnosis   CLINICAL DECISION MAKING: Evolving/moderate complexity  EVALUATION COMPLEXITY: Moderate  PLAN:  PT FREQUENCY: 2x/week  PT DURATION: 8 weeks  PLANNED INTERVENTIONS: Therapeutic exercises, Therapeutic activity, Neuromuscular re-education, Balance training, Gait training, Patient/Family education, Self Care, Joint mobilization, Stair training, Vestibular training, Canalith repositioning, Orthotic/Fit training, DME instructions, Manual therapy, and Re-evaluation  PLAN FOR NEXT SESSION: check goals and plan for D/C - pt got scheduled for screens in 6 months     Drake Leach, PT, DPT 02/21/2023, 8:22 AM

## 2023-02-20 NOTE — Patient Instructions (Signed)
Optimal Fitness Program after Therapy for People with Parkinson's Disease  1)  Therapy Home Exercise Program  -Do these Exercises DAILY as instructed by your therapist  -Big, deliberate effort with exercises  -These exercises are important to perform consistently, even when therapist has  finished, because these therapy exercises often address your specific  Parkinson's difficulties   2)  Walking  -  Work up to walking 3-5 times per week, 20-30 minutes per day  -This can be done at home, driveway, quiet street or an indoor track  -Focus should be on your Best posture, arm swing, step length for your best walking pattern  3)  Aerobic Exercise  -Work up to 3-5 times per week, 30 minutes per day  -This can be stationary bike, seated stepper machine, elliptical machine  -Work up to 7-8/10 intensity during the exercise, at minimal to moderate     Resistance      

## 2023-02-25 ENCOUNTER — Ambulatory Visit: Payer: Medicare Other | Admitting: Physical Therapy

## 2023-02-25 DIAGNOSIS — R2681 Unsteadiness on feet: Secondary | ICD-10-CM | POA: Diagnosis not present

## 2023-02-25 DIAGNOSIS — M6281 Muscle weakness (generalized): Secondary | ICD-10-CM

## 2023-02-25 DIAGNOSIS — R2689 Other abnormalities of gait and mobility: Secondary | ICD-10-CM

## 2023-02-25 NOTE — Therapy (Signed)
OUTPATIENT PHYSICAL THERAPY NEURO TREATMENT Patient Name: [name] MRN: [id number] DOB:1946-01-18, 77 y.o., male Today's Date: 02/25/2023   PCP: Ronnald Nian, MD  REFERRING PROVIDER: Tat, Octaviano Batty, DO  PHYSICAL THERAPY DISCHARGE SUMMARY  Visits from Start of Care: 27  Current functional level related to goals / functional outcomes: Pt requires RW at all times for safety and fluctuates between SBA-min A for safety due to festination, freezing, decreased safety awareness and impulsiveness.    Remaining deficits: Decreased safety awareness, impaired short term memory, impaired postural control, improper body mechanics    Education / Equipment: HEP   Patient agrees to discharge. Patient goals were partially met. Patient is being discharged due to lack of progress.     END OF SESSION:  PT End of Session - 02/25/23 1103     Visit Number 27    Number of Visits 30   Plus eval   Date for PT Re-Evaluation 03/10/23    Authorization Type [employer] Medicare    Progress Note Due on Visit 10    PT Start Time 1100    Activity Tolerance Patient tolerated treatment well    Behavior During Therapy [facility] for tasks assessed/performed;Anxious                      Past Medical History:  Diagnosis Date   ADD (attention deficit disorder)    Allergic rhinitis    skin test 02/23/08 vaccine 2009; allergy shots weekly through Dr. Maple Hudson   Anemia    takes Fe -    Anginal pain (HCC)    Ankylosing spondylitis (HCC)    Anxiety    sees therapist Vernell Leep   Arthritis    Asthma    Dr. 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 Donnie Aho   CHEILECTOMY  10/16/2011   Procedure: CHEILECTOMY;  Surgeon: Sherri Rad, MD;  Location: [facility];  Service: Orthopedics;  Laterality: Right;   CHOLECYSTECTOMY     COLONOSCOPY     CORONARY ANGIOPLASTY     1995 after MI   CORONARY ANGIOPLASTY WITH STENT PLACEMENT     1999    ESOPHAGOGASTRODUODENOSCOPY  02/18/2012   Procedure: ESOPHAGOGASTRODUODENOSCOPY (EGD);  Surgeon: Florencia Reasons, MD;  Location: [facility];  Service: Endoscopy;  Laterality: N/A;   FOOT SURGERY     joint scraped, arthritis right  foot   HERNIA REPAIR     LUMBAR DISC SURGERY     x2   right index finger  mass removed- arthritis     Dr. Amanda Pea   [facility]  Right 05/18/2021   medial toe consistent with callus   SKIN BIOPSY Left 05/17/2021   actinic keratosis   SPINAL CORD STIMULATOR BATTERY EXCHANGE N/A 10/11/2021   Procedure: SPINAL CORD STIMULATOR BATTERY EXCHANGE;  Surgeon: Venita Lick, MD;  Location: [facility];  Service: Orthopedics;  Laterality: N/A;  60 mins   SPINAL CORD STIMULATOR INSERTION N/A 11/21/2016   Procedure: LUMBAR SPINAL CORD STIMULATOR INSERTION;  Surgeon: Venita Lick, MD;  Location: [facility];  Service: Orthopedics;  Laterality: N/A;  Requests 2.5 hrs   TONSILLECTOMY     1953   UPPER GI ENDOSCOPY  [id number]   VASECTOMY     1980   Patient Active Problem List   Diagnosis  Date Noted   OSA (obstructive sleep apnea) 11/12/2022   Parkinson's disease 11/12/2022   Peripheral vascular disease (HCC) 12/25/2021   Dizziness 12/24/2021   Leg pain, left 11/14/2021   Chronic low back pain with sciatica 11/14/2021   Spinal cord stimulator status 11/14/2021   Muscle spasm 11/14/2021   Constipation 09/11/2021   Slow transit constipation 09/11/2021   Cough 09/11/2021   Early satiety 09/11/2021   Hoarseness 09/11/2021   Iron deficiency 09/11/2021   Personal history of colonic polyps 09/11/2021   Weight decreased 09/11/2021   Esophageal motility disorder 09/11/2021   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   Pain in both feet 08/10/2021   Atherosclerosis of aorta (HCC) 08/30/2020   Mild concentric left ventricular hypertrophy (LVH)    Hypogonadism male    History of kidney stones    Erectile dysfunction    COPD (chronic obstructive pulmonary disease) (HCC)    Acquired trigger finger of left middle finger 04/22/2019   Acquired trigger finger of right middle finger 04/22/2019   Left ventricular dysfunction 10/12/2018   Ankylosing spondylitis (HCC) 10/09/2018   Presbycusis of both ears 04/23/2018   Mild intermittent asthma without complication 04/23/2018   Chronic pain 11/21/2016   Arthritis 10/15/2016   Spondylolisthesis of lumbar region 02/28/2015   Hyperlipidemia    Lumbar disc disease    Hypertension    CAD (coronary artery disease) 05/19/2012   GERD (gastroesophageal reflux disease)    Depression with anxiety    Chronic asthma 05/17/2011   Allergic rhinitis due to pollen 08/08/2010   History of benign positional vertigo    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 No falls or pain. Lost his tennis balls for his RW.   Pt accompanied by:  Wife, [name]  PERTINENT HISTORY: Pt falls frequently, always in forward direction and insists on using back stairs despite frequent falls over top step. Started Sinemet week of 11/04/22  PAIN:  Are you having pain? No  There were no vitals filed for this visit.   PRECAUTIONS: Fall  WEIGHT BEARING RESTRICTIONS: No  FALLS: Has patient fallen in last 6 months? Yes. Number of falls "more than I can count"  LIVING ENVIRONMENT: Lives with: lives with their spouse Lives in: House/apartment Stairs: Yes: External: 3 in front and 5 in back steps; on right going up, on left going up, can reach both, and always uses back steps (where he has the most falls) Has following equipment at home: Single point cane  PLOF: Independent  PATIENT GOALS: "I want to be in the next Olympics" "I would like to walk with minimal pain and get up and down with no assistance"  OBJECTIVE:   COGNITION: Overall cognitive status: Impaired   SENSATION: Denies numbness/tingling in BUE/BLEs  COORDINATION: Heel to shin test: WNL bilaterally   EDEMA: Frequent swelling in R ankle    POSTURE: rounded shoulders, forward head, and left pelvic obliquity  LOWER EXTREMITY MMT:  Tested in seated position   MMT Right Eval Left Eval  Hip flexion 4+ 5  Hip extension    Hip abduction 4 4  Hip adduction 5 5  Hip internal rotation    Hip external rotation    Knee flexion 5 5  Knee extension 5 5  Ankle dorsiflexion 3 5  Ankle plantarflexion    Ankle inversion    Ankle eversion    (Blank rows = not tested)   TODAY'S TREATMENT:    Ther Act  LTG Assessment    Gait pattern: step through pattern, decreased  step length- Right, decreased stride length, decreased ankle dorsiflexion- Right, decreased ankle dorsiflexion- Left, knee flexed in stance- Right, shuffling, trunk flexed, and poor foot clearance- Right Distance walked: 115' loop completed 6 + 79' = 769' Assistive device utilized: Environmental consultant - 2 wheeled Level of assistance: CGA and Min A Comments: Pt demonstrated extreme forward flexed posture and essentially pushed the RW away from him during entirety of test while frequently catching RLE. Max multimodal cues to maintain close distance to RW, slow down, stop and reset, stand up tall and take big steps w/RLE, but pt not responsive to cues. Pt frequently getting too close to objects on R side and required cues to avoid hitting chairs/other patients. Pt also not achieving full knee extension on R side and maintained weight on forefoot, facilitating his scuffing of foot. RPE of 6/10 following activity   Had pt perform seated hamstring stretch on RLE due to maintained knee flexion w/gait and pt unable to follow therapist's visual and verbal cues to perform, states he does not remember this from his HEP. Continue to provide max education on importance of performing HEP at home and staying active, but pt did not respond.  Attempted to perform 10 sit to stands without UE support and no cues, but pt demonstrated significant retropulsion, lack of full hip extension, premature knee extension and decreased anterior lean. When pt asked to explain what deficits he was displaying, pt unable to answer and had very little body awareness. Provided max multimodal cues for proper technique and pt then able to perform 10 sit  to stands w/no UE support or retropulsion, but was not able to teach back technique at end of session.  Discussed plan to return in 61mo for screens but encouraged pt to return sooner if needed and to obtain referral from Dr. Arbutus Leas.  Reviewed freezing techniques, proper gait mechanics and importance of big  movements w/PD, but pt appeared distracted this date and had poor retention/recall.     Gait pattern: step through pattern, decreased step length- Right, decreased stride length, decreased hip/knee flexion- Right, decreased ankle dorsiflexion- Right, shuffling, decreased trunk rotation, trunk flexed, and poor foot clearance- Right Distance walked: Various clinic distances   Assistive device utilized: Walker - 2 wheeled Level of assistance: CGA Comments: Pt needing cues for staying closer to RW, heel strike, and tall posture. Increased forward flexed posture and scuffing of RLE this date   PATIENT EDUCATION: Education details: See therapeutic activity section above Person educated: Patient and Spouse Education method: Explanation, Demonstration, and Verbal cues Education comprehension: verbalized understanding, verbal cues required, and needs further education  HOME EXERCISE PROGRAM: Walking program, starting with 3-4 minutes daily around the house with focus on tall posture, stride length, and staying close to RW   Seated PWR Moves Supine PWR Up  Access Code: [email or web address] URL: [email or web address] Date: 02/21/2023 Prepared by: Sherlie Ban  Exercises - Sit to Stand Without Arm Support  - 1 x daily - 7 x weekly - 3 sets - 10 reps - Supine Bridge  - 1 x daily - 7 x weekly - 1-2 sets - 10 reps - Modified Thomas Stretch  - 1 x daily - 7 x weekly - 3-5 minute hold - Side to Side Weight Shift with Overhead Reach and Counter Support  - 1 x daily - 5 x weekly - 1-2 sets - 10 reps - Step Sideways with Arms Reaching  - 1 x daily - 5 x weekly - 1-2 sets - 10 reps  GOALS: Goals reviewed with patient? Yes  LONG TERM GOALS: Target date: 01/10/2023  Pt will be independent with final HEP for improved strength, balance, transfers and gait.  Baseline: pt reports not doing them as much as he should, reports he needs to do more  Goal status: IN PROGRESS  2.  Pt will improve gait  velocity to at least 2.6 ft/s w/LRAD and SBA for improved gait efficiency and independence   Baseline: 1.95 ft/s w/o AD and min A  17.53 seconds = 1.87 ft/sec with RW   Goal status: NOT MET  3.  Pt will improve TUG time to 15 seconds or less with LRAD and supervision  in order to demo decrease fall risk. Baseline: 20 seconds with no AD, CGA  11 seconds with no AD with supervision/CGA  Goal status: MET  4.  Pt will improve 5 x STS to less than or equal to 15 seconds w/good posture and form to demonstrate improved functional strength and transfer efficiency.   Baseline: 15.41s w/BUE support, significant posterior lean and flexed posture   14.1 seconds with no UE support on 01/09/23 with no episodes of retropulsion  Goal status: MET  5.  Pt will trial various bracing options on RLE to reduce foot slap and fall frequency.  Baseline: bracing options not trialed, pt using a RW to decr fall risk.  Goal status: N/A    UPDATED/ONGOING LTGS FOR RE-CERT  LONG TERM GOALS: Target date: 02/25/2023  Pt will be independent with final HEP for improved strength, balance, transfers and gait.  Baseline: pt reports not doing them as much as he should, reports he needs to do more  Goal status: PARTIALLY MET  2.  Pt will perform 10 reps of sit <> stands with no UE support and no retropulsion in order to demo improved transfer efficiency.   Baseline: Goal status: PARTIALLY MET  3.  Pt will perform car transfer with supervision in order to demo improved functional mobility. Baseline: pt reports difficulty getting in and out of the car; mod I on 9/3  Goal status: MET  4.  Pt will perform floor transfer with CGA for fall recovery strategies  Baseline:  Goal status: NOT MET - Pt not safe to try this date   5.  Pt will improve with RW to at least 720' with RPE as a 3/10 or less  in order to demo improved gait efficiency/endurance Baseline: 31' with 5/10 RPE; 769' w/ 6/10 RPE Goal status: NOT  MET   ASSESSMENT:  CLINICAL IMPRESSION: Emphasis of skilled PT session on LTG assessment and DC from PT. Pt has met 1 of 5 LTGs, performing a car transfer at mod I level a few sessions back. Pt has not been performing his HEP despite max education for weeks from both this therapist and Chloe. Pt unable to recall his HEP despite reviewing it for entire session last session. Pt is able to perform when provided w/pictures, so consider pt to have partially met that goal. Pt did improve his distance on today but demonstrated significant festination, forward flexed posture and maintained R knee flexion w/scuffing of R foot, requiring min A and max verbal cues for safety. Pt also unable to perform 10 sit to stands w/proper form unless provided with max cues, but did perform well once re-educated on proper technique. Did not assess floor transfer this date as pt not attentive during session and therapist deemed this unsafe. Pt continues to be limited by impaired attention and impulsiveness and can improve his gait kinematics and posture if he slows down, but pt has poor body and safety awareness. Pt has been provided w/repeated education on safety, freezing techniques and PD for several months but continues to have poor retention and recall. Pt reports he is more depressed currently and has not been painting like he used to. Pt and wife in agreement to DC from PT this date as pt is not making progress in PT and will benefit from a break for his mental health. Pt is scheduled for PD screens in March but was educated on how to obtain new referral sooner if mobility needs change.     OBJECTIVE IMPAIRMENTS: Abnormal gait, decreased balance, decreased cognition, decreased coordination, decreased knowledge of condition, decreased knowledge of use of DME, decreased mobility, difficulty walking, decreased strength, decreased safety awareness, impaired perceived functional ability, improper body mechanics, postural  dysfunction, and pain  ACTIVITY LIMITATIONS: carrying, lifting, bending, standing, squatting, stairs, transfers, bed mobility, reach over head, locomotion level, and caring for others  PARTICIPATION LIMITATIONS: medication management, interpersonal relationship, driving, shopping, community activity, and yard work  PERSONAL FACTORS: Age, Behavior pattern, Fitness, Past/current experiences, and 1 comorbidity: Vascular parkinsonism  are also affecting patient's functional outcome.   REHAB POTENTIAL: Fair due to diagnosis   CLINICAL DECISION MAKING: Evolving/moderate complexity  EVALUATION COMPLEXITY: Moderate  PLAN:  PT FREQUENCY: 2x/week  PT DURATION: 8 weeks  PLANNED INTERVENTIONS: Therapeutic exercises, Therapeutic activity, Neuromuscular re-education, Balance training, Gait training, Patient/Family education, Self Care, Joint mobilization, Stair training, Vestibular training, Canalith  repositioning, Orthotic/Fit training, DME instructions, Manual therapy, and Re-evaluation     Niya Behler E Ori Trejos, PT, DPT 02/25/2023, 11:05 AM

## 2023-02-26 NOTE — Progress Notes (Unsigned)
Cardiology Office Note:    Date:  02/27/2023   ID:  [name], DOB 1945-07-18, MRN [id number]  PCP:  Ronnald Nian, MD  Cardiologist:  Norman Herrlich, MD    Referring MD: Ronnald Nian, MD    ASSESSMENT:    1. Coronary artery disease of native artery of native heart with stable angina pectoris (HCC)   2. Left ventricular dysfunction   3. Primary hypertension   4. Mixed hyperlipidemia   5. Ascending aorta enlargement (HCC)    PLAN:    In order of problems listed above:  Stable CAD having no anginal discomfort good medical therapy including long-term clopidogrel calcium channel blocker and lipid-lowering with his high intensity statin I would continue except reduce the calcium channel blocker long-acting to every other day to minimize edema and I do not think he requires an ischemia evaluation His ejection fraction subsequently normalized Well-controlled continue current treatment Lipids are at target Not uncommon to see mild enlargement of the ascending aorta is a finding in his age group with heart disease hypertension I do not think he requires repeat dedicated imaging   Next appointment: I will see him in 1 year   Medication Adjustments/Labs and Tests Ordered: Current medicines are reviewed at length with the patient today.  Concerns regarding medicines are outlined above.  Orders Placed This Encounter  Procedures   EKG 12-Lead   No orders of the defined types were placed in this encounter.    History of Present Illness:    [name] is a 77 y.o. male with a hx of CAD with PCI and balloon angioplasty 1995 subsequent PCI and stent right coronary artery 1995 LV dysfunction EF 40 to 45% subsequent normal lysing by echo hypertensive heart disease hyperlipidemia and mild enlargement ascending aorta 38 mm  last seen 07/19/2022.  Compliance with diet, lifestyle and medications: Yes I have discontinued his calcium channel blocker he is back on it and he  is noticing edema at his ankles  From a cardiology perspective he has done well no angina shortness of breath orthopnea palpitation or syncope He tolerates his lipid-lowering therapy without muscle pain or weakness his most recent lipid profile shows an LDL of 58 at target  Unfortunately continues to have falls at home he is just completed a course of physical therapy tells me he is evaluated by neurology for movement disorder Past Medical History:  Diagnosis Date   ADD (attention deficit disorder)    Allergic rhinitis    skin test 02/23/08 vaccine 2009; allergy shots weekly through Dr. Maple Hudson   Anemia    takes Fe -    Anginal pain (HCC)    Ankylosing spondylitis (HCC)    Anxiety    sees therapist Vernell Leep   Arthritis    Asthma    Dr. 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   albuterol (PROAIR HFA) 108 (90 Base) MCG/ACT inhaler Inhale 2 puffs into the lungs every 6 (six) hours as needed for wheezing or shortness of breath.   ALPRAZolam (XANAX) 0.25 MG tablet TAKE 1 TABLET BY MOUTH 2 TIMES DAILY AS NEEDED.   atorvastatin (LIPITOR) 20 MG tablet Take 1 tablet  (20 mg total) by mouth daily.   azelastine (ASTELIN) 0.1 % nasal spray Place 2 sprays into both nostrils 2 (two) times daily. Use in each nostril as directed   buPROPion (WELLBUTRIN XL) 300 MG 24 hr tablet Take 1 tablet (300 mg total) by mouth daily.   calcium carbonate (OS-CAL) 600 MG TABS tablet Take 600 mg by mouth daily with breakfast.   carbidopa-levodopa (SINEMET IR) 25-100 MG tablet TAKE 1 TABLET BY MOUTH 3 (THREE) TIMES DAILY. 8AM/NOON/4PM   cetirizine (ZYRTEC) 10 MG tablet Take 10 mg by mouth daily.   clopidogrel (PLAVIX) 75 MG tablet Take 1 tablet (75 mg total) by mouth daily.   desvenlafaxine (PRISTIQ) 50 MG 24 hr tablet Take 1 tablet (50 mg total) by mouth daily.   diltiazem (CARDIZEM CD) 240 MG 24 hr capsule Take 1 capsule (240 mg total) by mouth daily.   ferrous sulfate 325 (65 FE) MG tablet Take 325 mg by mouth daily.   Flaxseed, Linseed, (FLAX SEED OIL) 1000 MG CAPS Take 1,000 mg by mouth every 7 (seven) days.   GLUCOSAMINE CHONDROITIN COMPLX PO Take 1 tablet by mouth daily.   guaiFENesin (MUCINEX) 600 MG 12 hr tablet Take 600 mg by mouth 2 (two) times daily as needed for cough.   meclizine (ANTIVERT) 25 MG tablet 1 twice a day as needed for dizziness   methylphenidate (CONCERTA) 54 MG PO CR tablet Take 1 tablet (54 mg total) by mouth every morning.   Multiple Vitamins-Minerals (MULTIVITAMIN WITH MINERALS) tablet Take 1 tablet by mouth daily.   pantoprazole (PROTONIX) 40 MG tablet Take 1 tablet (40 mg total) by mouth daily.   sucralfate (CARAFATE) 1 GM/10ML suspension Take 10 mLs (1 g total) by mouth daily.   umeclidinium-vilanterol (ANORO ELLIPTA) 62.5-25 MCG/ACT AEPB Inhale 1 puff into the lungs daily at 6 (six) AM.      EKGs/Labs/Other Studies Reviewed:    The following studies were reviewed today:   EKG Interpretation Date/Time:  Thursday February 27 2023 08:54:22 EDT Ventricular Rate:  83 PR Interval:  184 QRS Duration:  94 QT Interval:  358 QTC  Calculation: 420 R Axis:   7  Text Interpretation: Sinus rhythm with Premature atrial complexes Inferior infarct , age undetermined Abnormal ECG When compared with ECG of 10-Sep-2022 05:04, Premature atrial complexes are now Present Inferior infarct is now Present Inverted T waves have replaced nonspecific T wave abnormality in Inferior leads Confirmed by Norman Herrlich ([id number]) on 02/27/2023 9:06:21 AM    Recent Labs: 09/09/2022: ALT 24; BUN 7; Creatinine, Ser 0.90; Hemoglobin 13.6; Platelets 189; Potassium 3.2; Sodium 138 02/19/2023: TSH 0.69  Recent Lipid Panel    Component Value Date/Time   CHOL 133 07/09/2022 1230   TRIG 121 07/09/2022 1230   HDL 54 07/09/2022 1230   CHOLHDL 2.5 07/09/2022 1230  CHOLHDL 3.4 10/15/2016 1056   VLDL 33 (H) 10/15/2016 1056   LDLCALC 58 07/09/2022 1230    Physical Exam:    VS:  BP 118/80 (BP Location: Left Arm, Patient Position: Sitting, Cuff Size: Normal)   Pulse 83   Ht 5\' 3"  (1.6 m)   Wt 163 lb 12.8 oz (74.3 kg)   SpO2 96%   BMI 29.02 kg/m     Wt Readings from Last 3 Encounters:  02/27/23 163 lb 12.8 oz (74.3 kg)  02/19/23 162 lb (73.5 kg)  01/01/23 154 lb 12.8 oz (70.2 kg)     GEN:   Well nourished, well developed in no acute distress HEENT: Normal NECK: No JVD; No carotid bruits LYMPHATICS: No lymphadenopathy CARDIAC:  RRR, no murmurs, rubs, gallops RESPIRATORY:  Clear to auscultation without rales, wheezing or rhonchi  ABDOMEN: Soft, non-tender, non-distended MUSCULOSKELETAL:  No edema; No deformity  SKIN: Warm and dry NEUROLOGIC:  Alert and oriented x 3 PSYCHIATRIC:  Normal affect    Signed, Norman Herrlich, MD  02/27/2023 9:23 AM    [facility]

## 2023-02-27 ENCOUNTER — Encounter: Payer: Self-pay | Admitting: Cardiology

## 2023-02-27 ENCOUNTER — Encounter: Payer: Self-pay | Admitting: Family Medicine

## 2023-02-27 ENCOUNTER — Ambulatory Visit: Payer: Medicare Other | Attending: Cardiology | Admitting: Cardiology

## 2023-02-27 ENCOUNTER — Ambulatory Visit (INDEPENDENT_AMBULATORY_CARE_PROVIDER_SITE_OTHER): Payer: Medicare Other | Admitting: Family Medicine

## 2023-02-27 VITALS — BP 116/70 | HR 70 | Wt 162.0 lb

## 2023-02-27 VITALS — BP 118/80 | HR 83 | Ht 63.0 in | Wt 163.8 lb

## 2023-02-27 DIAGNOSIS — R1032 Left lower quadrant pain: Secondary | ICD-10-CM | POA: Diagnosis not present

## 2023-02-27 DIAGNOSIS — Z23 Encounter for immunization: Secondary | ICD-10-CM | POA: Diagnosis not present

## 2023-02-27 DIAGNOSIS — I25118 Atherosclerotic heart disease of native coronary artery with other forms of angina pectoris: Secondary | ICD-10-CM | POA: Diagnosis not present

## 2023-02-27 DIAGNOSIS — I519 Heart disease, unspecified: Secondary | ICD-10-CM | POA: Diagnosis not present

## 2023-02-27 DIAGNOSIS — I10 Essential (primary) hypertension: Secondary | ICD-10-CM | POA: Diagnosis not present

## 2023-02-27 DIAGNOSIS — E782 Mixed hyperlipidemia: Secondary | ICD-10-CM | POA: Diagnosis not present

## 2023-02-27 DIAGNOSIS — I7789 Other specified disorders of arteries and arterioles: Secondary | ICD-10-CM

## 2023-02-27 MED ORDER — DILTIAZEM HCL ER COATED BEADS 240 MG PO CP24: 240.0000 mg | ORAL_CAPSULE | ORAL | 3 refills | Status: DC

## 2023-02-27 NOTE — Progress Notes (Signed)
Subjective:    Patient ID: [name], male    DOB: [birth year], 77 y.o.   MRN: [id number]  HPI  He has a two week history of lest inguinal pain.no urinary,bowel or musculoskeletal pain   Review of Systems     Objective:    Physical Exam No palpable tenderness in the left inguinal area.  No evidence of hernia.  Penis and testes are normal.       Assessment & Plan:  Left inguinal pain  Need for COVID-19 vaccine - Plan: Pfizer Comirnaty Covid -19 Vaccine 1yrs and older  Need for immunization against influenza - Plan: Flu Vaccine Trivalent High Dose (Fluad) At this point there is no reason behind his inguinal pain and I recommend conservative care.  Recommend he pay more attention to anything that makes this better or worse and certainly let me know at that point.

## 2023-02-27 NOTE — Patient Instructions (Signed)
Medication Instructions:  Your physician has recommended you make the following change in your medication:   START: Cardizem 240 mg every other day  *If you need a refill on your cardiac medications before your next appointment, please call your pharmacy*   Lab Work: None If you have labs (blood work) drawn today and your tests are completely normal, you will receive your results only by: MyChart Message (if you have MyChart) OR A paper copy in the mail If you have any lab test that is abnormal or we need to change your treatment, we will call you to review the results.   Testing/Procedures: None   Follow-Up: At [facility], you and your health needs are our priority.  As part of our continuing mission to provide you with exceptional heart care, we have created designated Provider Care Teams.  These Care Teams include your primary Cardiologist (physician) and Advanced Practice Providers (APPs -  Physician Assistants and Nurse Practitioners) who all work together to provide you with the care you need, when you need it.  We recommend signing up for the patient portal called "MyChart".  Sign up information is provided on this After Visit Summary.  MyChart is used to connect with patients for Virtual Visits (Telemedicine).  Patients are able to view lab/test results, encounter notes, upcoming appointments, etc.  Non-urgent messages can be sent to your provider as well.   To learn more about what you can do with MyChart, go to [email or web address].    Your next appointment:   1 year(s)  Provider:   Norman Herrlich, MD    Other Instructions None

## 2023-03-16 NOTE — Progress Notes (Signed)
Patient ID: [name], male    DOB: [birth year], 77 y.o.   MRN: [id number]  HPI M never smoker followed for asthma, allergic rhinitis complicated by hx ADHD, CAD/ MI, GERD, anemia  NPSG 10/21/22- AHI 28.7/ hr, desat to 89%/Mean 93%, body weight 165 lbs  --------------------------------------------------------------------------------    12/16/22- 24 yo M never smoker followed for Asthma, allergic Rhinitis complicated by hx ADHD, CAD/ MI, GERD, anemia, back pain/spinal stimulator, Vascular Parkinson's,  Anoro, ProAir, Singulair, zyrtec, Concerta ER 54, xanax 0.25 mg bid prn,  ED 09/10/22- fall and altered mental status. Concussion.  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  I explained moderately severe obstructive sleep apnea.  His wife already uses CPAP from [place] so we are going to establish him with the same.  I did briefly review alternative treatments. He has now seen Dr. Arbutus Leas for Neurology with referral for speech therapy because of his hoarseness.  03/18/23- 52 yo M never smoker followed for OSA, Asthma, allergic Rhinitis complicated by hx ADHD, CAD/ MI, HTN, GERD, anemia, back pain/spinal stimulator, Vascular Parkinson's,  -Anoro, ProAir, Singulair, zyrtec, Concerta ER 54, xanax 0.25 mg bid prn,  CPAP auto 5-20/ [facility] Download compliance 67%, AHI 4.5/hr Body weight today-162 lbs Occasional cough and some dyspnea on exertion with hills and stairs.  No dramatic change. Download reviewed.  He does not like his CPAP mask.  We discussed changing. CXR 09/09/22-  No active cardiopulmonary disease. Borderline cardiomegaly.    Review of Systems-see HPI + = positive Constitutional:   No-   weight loss, night sweats, fevers, chills, fatigue, lassitude. HEENT:   No-current  headaches, difficulty swallowing,  tooth/dental problems, sore throat,       No-  sneezing, itching, ear ache, no-nasal congestion, post nasal drip,  CV:  No-chest pain, No- orthopnea, PND, swelling in lower extremities, anasarca, dizziness, palpitations Resp: +   shortness of breath with exertion or at rest.              No-   productive cough,   +non-productive cough,  No- coughing up of blood.              No-   change in color of mucus.  No- wheezing.   Skin: No-   rash or lesions. GI:  No-   heartburn, indigestion, abdominal pain, nausea, vomiting,  GU:  MS:  No-   joint pain or swelling. Neuro-     nothing unusual Psych:  No- change in mood or affect. No depression or anxiety.  No memory loss.     Objective:   Physical Exam General- Alert, Oriented, Affect-appropriate, Distress- none acute Skin- rash-none, lesions- none, excoriation- none Lymphadenopathy- none Head- atraumatic            Eyes- Gross vision intact, PERRLA, conjunctivae clear secretions            Ears- Hearing, canals-normal            Nose- Clear, no-Septal dev, mucus, polyps, erosion, perforation             Throat- Mallampati II , mucosa clear, drainage- none, tonsils- atrophic. +weak voice Neck- flexible , trachea midline, +no stridor , thyroid nl, carotid no bruit Chest - symmetrical excursion , unlabored  Heart/CV- RRR , no murmur , no gallop  , no rub, nl s1 s2                           - JVD- none , edema- none, stasis changes- none, varices- none           Lung-+ clear , good airflow,wheeze- none, cough- none , dullness-none, rub- none           Chest wall- + spinal stimulator Abd- Br/ Gen/ Rectal- Not done, not indicated Extrem- Normal apparent strength and mobility Neuro- grossly intact to observation

## 2023-03-18 ENCOUNTER — Encounter: Payer: Self-pay | Admitting: Internal Medicine

## 2023-03-18 ENCOUNTER — Ambulatory Visit: Payer: Medicare Other | Admitting: Internal Medicine

## 2023-03-18 VITALS — BP 126/75 | HR 78 | Ht 66.0 in | Wt 162.0 lb

## 2023-03-18 DIAGNOSIS — G4733 Obstructive sleep apnea (adult) (pediatric): Secondary | ICD-10-CM | POA: Diagnosis not present

## 2023-03-18 DIAGNOSIS — J439 Emphysema, unspecified: Secondary | ICD-10-CM | POA: Diagnosis not present

## 2023-03-18 NOTE — Patient Instructions (Signed)
Order- DME [facility]- continue CPAP auto 5-20. Please send nasal pillows masks for trial. Continue [employer], supplies.  We will see how you are doing with CPAP at next visit. We can decide then whether to look into a fitted oral appliance as a possible alternative to CPAP.

## 2023-03-25 ENCOUNTER — Other Ambulatory Visit: Payer: Self-pay | Admitting: Family Medicine

## 2023-03-25 ENCOUNTER — Other Ambulatory Visit: Payer: Self-pay | Admitting: *Deleted

## 2023-03-25 DIAGNOSIS — M2021 Hallux rigidus, right foot: Secondary | ICD-10-CM

## 2023-03-25 DIAGNOSIS — J4530 Mild persistent asthma, uncomplicated: Secondary | ICD-10-CM

## 2023-03-25 DIAGNOSIS — F418 Other specified anxiety disorders: Secondary | ICD-10-CM

## 2023-03-25 DIAGNOSIS — J4520 Mild intermittent asthma, uncomplicated: Secondary | ICD-10-CM

## 2023-03-25 HISTORY — DX: Hallux rigidus, right foot: M20.21

## 2023-03-25 MED ORDER — ALPRAZOLAM 0.25 MG PO TABS
0.2500 mg | ORAL_TABLET | Freq: Two times a day (BID) | ORAL | 1 refills | Status: DC | PRN
Start: 2023-03-25 — End: 2023-09-15

## 2023-03-25 NOTE — Telephone Encounter (Signed)
Patient is requesting a refill on alprazolam to be sent to [facility] [address].

## 2023-04-04 ENCOUNTER — Encounter: Payer: Self-pay | Admitting: Internal Medicine

## 2023-04-04 NOTE — Assessment & Plan Note (Signed)
He would like to try a nasal mask.  We discussed alternative treatment directions.  On return consider referring for oral appliance. Plan-DME company to refit mask, try nasal pillows.

## 2023-04-04 NOTE — Assessment & Plan Note (Addendum)
Previous negative methacholine challenge. Plan-continue current inhaled medicines.  Watch for evidence of fluid retention or cardiac problems.  Consider formal PFT.

## 2023-04-09 ENCOUNTER — Telehealth: Payer: Self-pay | Admitting: Family Medicine

## 2023-04-09 DIAGNOSIS — F418 Other specified anxiety disorders: Secondary | ICD-10-CM

## 2023-04-09 NOTE — Telephone Encounter (Signed)
Pt left voice mail  needs refill on alprazolam

## 2023-04-21 ENCOUNTER — Other Ambulatory Visit: Payer: Self-pay | Admitting: Cardiology

## 2023-04-21 DIAGNOSIS — I2510 Atherosclerotic heart disease of native coronary artery without angina pectoris: Secondary | ICD-10-CM

## 2023-04-24 ENCOUNTER — Other Ambulatory Visit: Payer: Self-pay | Admitting: Family Medicine

## 2023-05-02 ENCOUNTER — Encounter: Payer: Self-pay | Admitting: Psychology

## 2023-05-02 DIAGNOSIS — F411 Generalized anxiety disorder: Secondary | ICD-10-CM | POA: Insufficient documentation

## 2023-05-05 ENCOUNTER — Ambulatory Visit: Payer: Medicare Other

## 2023-05-05 ENCOUNTER — Ambulatory Visit (INDEPENDENT_AMBULATORY_CARE_PROVIDER_SITE_OTHER): Payer: Medicare Other | Admitting: Psychology

## 2023-05-05 ENCOUNTER — Encounter: Payer: Self-pay | Admitting: Psychology

## 2023-05-05 ENCOUNTER — Telehealth: Payer: Self-pay | Admitting: Family Medicine

## 2023-05-05 DIAGNOSIS — G214 Vascular parkinsonism: Secondary | ICD-10-CM | POA: Diagnosis not present

## 2023-05-05 DIAGNOSIS — R41840 Attention and concentration deficit: Secondary | ICD-10-CM

## 2023-05-05 DIAGNOSIS — R4189 Other symptoms and signs involving cognitive functions and awareness: Secondary | ICD-10-CM

## 2023-05-05 NOTE — Progress Notes (Signed)
   Psychometrician Note   Cognitive testing was administered to [name] by Wallace Keller, B.S. (psychometrist) under the supervision of Dr. Newman Nickels, Ph.D., licensed psychologist on 05/05/2023. [name] did not appear overtly distressed by the testing session per behavioral observation or responses across self-report questionnaires. Rest breaks were offered.    The battery of tests administered was selected by Dr. Newman Nickels, Ph.D. with consideration to [name] current level of functioning, the nature of his symptoms, emotional and behavioral responses during interview, level of literacy, observed level of motivation/effort, and the nature of the referral question. This battery was communicated to the psychometrist. Communication between Dr. Newman Nickels, Ph.D. and the psychometrist was ongoing throughout the evaluation and Dr. Newman Nickels, Ph.D. was immediately accessible at all times. Dr. Newman Nickels, Ph.D. provided supervision to the psychometrist on the date of this service to the extent necessary to assure the quality of all services provided.    [name] will return within approximately 1-2 weeks for an interactive feedback session with Dr. Milbert Coulter at which time his test performances, clinical impressions, and treatment recommendations will be reviewed in detail. [name] understands he can contact our office should he require our assistance before this time.  A total of 130 minutes of billable time were spent face-to-face with [name] by the psychometrist. This includes both test administration and scoring time. Billing for these services is reflected in the clinical report generated by Dr. Newman Nickels, Ph.D.  This note reflects time spent with the psychometrician and does not include test scores or any clinical interpretations made by Dr. Milbert Coulter. The full report will follow in a separate note.

## 2023-05-05 NOTE — Progress Notes (Unsigned)
NEUROPSYCHOLOGICAL EVALUATION [place]. [facility] Department of Neurology  Date of Evaluation: May 05, 2023  Reason for Referral:   [name] is a 77 y.o. right-handed Caucasian male referred by Dwayne Kays, PA-C, to characterize his current cognitive functioning and assist with diagnostic clarity and treatment planning in the context of vascular parkinsonism, ADHD, and subjective cognitive decline.   Assessment and Plan:   Clinical Impression(s): [name]' pattern of performance is suggestive of impairment surrounding executive functioning. Performance variability was further exhibited across processing speed, complex attention, semantic fluency, and both encoding (i.e., learning) and delayed retrieval aspects of memory. Performances were appropriate relative to age-matched peers across basic attention, abstract reasoning, safety/judgment, receptive language, phonemic fluency, confrontation naming, visuospatial abilities, and recognition/consolidation aspects of memory. [name] largely denied difficulties completing instrumental activities of daily living (ADLs) independently. His wife was in general agreement. As such, given evidence for cognitive dysfunction described above, he meets criteria for a Mild Neurocognitive Disorder ("mild cognitive impairment") at the present time.  The etiology for ongoing cognitive dysfunction is multifactorial in nature. Neurodevelopmentally, he described longstanding difficulties with sustained attention, distractibility, and hyperactivity dating back to adolescence and early adulthood. Difficulties were said to directly impact academic performance, as well as social pursuits. He was eventually diagnosed with ADHD as an adult. Performances across cognitive testing can certainly be seen in individuals with dysregulated attentional networks and this history is likely a primary contributor to ongoing  dysfunction.  Neurologically speaking, Dr. Arbutus Leas has previously theorized a vascular parkinsonian presentation being more likely than idiopathic Parkinson's disease. Given his spinal cord stimulator implant, the extent of microvascular disease likely cannot be better discerned via a brain MRI to help with diagnostic clarity. Given his degree of cardiovascular medical ailments and his predominantly lower body clinical presentation, the former presentation certainly seems a reasonable conceptualization. In either case, [name]' current testing patterns are also fairly consistent with typical patterns seen across these illnesses.   Taken together, the most likely culprit for objective dysfunction across testing and subjective concerns in his day-to-day life would be his parkinsonian presentation superimposed on ADHD. Further daily experiences surrounding chronic pain, sleep dysfunction, and mild psychiatric distress would serve to worsen dysfunction. Medication side effects, namely alprazolam/Xanax and meclizine, may also play a role in diminishing cognitive functioning. Despite some memory variability, current patterns do not suggest concerns for symptomatic Alzheimer's disease. Testing patterns also do not align well with Lewy body disease and behavioral characteristics are not suggestive of frontotemporal lobar degeneration. Continued medical monitoring will be important moving forward.    Recommendations: A repeat neuropsychological evaluation in 18-24 months could be considered to assess the trajectory of future cognitive decline should it occur.  A combination of medication and psychotherapy has been shown to be most effective at treating symptoms of anxiety and depression. As such, [name] is encouraged to speak with his prescribing physician regarding medication adjustments to optimally manage these symptoms. Likewise, [name] is encouraged to consider engaging in short-term psychotherapy  to address symptoms of psychiatric distress. He would benefit from an active and collaborative therapeutic environment, rather than one purely supportive in nature. Recommended treatment modalities include Cognitive Behavioral Therapy (CBT) or Acceptance and Commitment Therapy (ACT).  Performance across neurocognitive testing is not a strong predictor of an individual's safety operating a motor vehicle. Should his family wish to pursue a formalized driving evaluation, they could reach out to the following agencies: [employer] in  [place]: [phone] [facility]: [phone] [facility]: [phone] [facility]: [phone] or [phone]  Should there be progression of current deficits over time, [name] is unlikely to regain any independent living skills lost. Therefore, it is recommended that he remain as involved as possible in all aspects of household chores, finances, and medication management, with supervision to ensure adequate performance. He will likely benefit from the establishment and maintenance of a routine in order to maximize his functional abilities over time.  It will be important for [name] to have another person with him when in situations where he may need to process information, weigh the pros and cons of different options, and make decisions, in order to ensure that he fully understands and recalls all information to be considered.  If not already done, [name] and his family may want to discuss his wishes regarding durable power of attorney and medical decision making, so that he can have input into these choices. If they require legal assistance with this, long-term care resource access, or other aspects of estate planning, they could reach out to [employer] at [phone] for a free consultation.   [name] is encouraged to attend to lifestyle factors for brain health (e.g., regular physical  exercise, good nutrition habits and consideration of the MIND-DASH diet, regular participation in cognitively-stimulating activities, and general stress management techniques), which are likely to have benefits for both emotional adjustment and cognition. In fact, in addition to promoting good general health, regular exercise incorporating aerobic activities (e.g., brisk walking, jogging, cycling, etc.) has been demonstrated to be a very effective treatment for depression and stress, with similar efficacy rates to both antidepressant medication and psychotherapy. Optimal control of vascular risk factors (including safe cardiovascular exercise and adherence to dietary recommendations) is encouraged. Likewise, continued compliance with his CPAP machine will also be important. Continued participation in activities which provide mental stimulation and social interaction is also recommended.   Memory can be improved using internal strategies such as rehearsal, repetition, chunking, mnemonics, association, and imagery. External strategies such as written notes in a consistently used memory journal, visual and nonverbal auditory cues such as a calendar on the refrigerator or appointments with alarm, such as on a cell phone, can also help maximize recall.    When learning new information, he would benefit from information being broken up into small, manageable pieces. he may also find it helpful to articulate the material in his own words and in a context to promote encoding at the onset of a new task. This material may need to be repeated multiple times to promote encoding.  Because he shows better recall for structured information, he will likely understand and retain new information better if it is presented to him in a meaningful or well-organized manner at the outset, such as grouping items into meaningful categories or presenting information in an outlined, bulleted, or story format.  To address problems with  processing speed, he may wish to consider:   -Ensuring that he is alerted when essential material or instructions are being presented   -Adjusting the speed at which new information is presented   -Allowing for more time in comprehending, processing, and responding in conversation   -Repeating and paraphrasing instructions or conversations aloud  To address problems with fluctuating attention and/or executive dysfunction, he may wish to consider:   -Avoiding external distractions when needing to concentrate   -Limiting exposure to fast paced environments with multiple sensory demands   -Writing  down complicated information and using checklists   -Attempting and completing one task at a time (i.e., no multi-tasking)   -Verbalizing aloud each step of a task to maintain focus   -Taking frequent breaks during the completion of steps/tasks to avoid fatigue   -Reducing the amount of information considered at one time   -Scheduling more difficult activities for a time of day where he is usually most alert  Review of Records:   Mr. Schlie was seen by [facility] Lurena Joiner Tat, D.O.) on 11/06/2022 for an evaluation of shuffling gait. Per Dr. Don Perking exam, Mr. Reem exhibited a mild bilateral tremor, intermittent hoarseness, some REM sleep behaviors, shuffling gait, slow movements, difficulty arising out of a chair, and tripping especially while walking up stairs. Some cognitive changes were reported; however, these were described as minimal to mild. At that time, Dr. Arbutus Leas reportedly did not see evidence for idiopathic Parkinson's disease. She did however express concern surrounding a vascular parkinsonism presentation. This was evidenced by Mr. Hoefling looking good clinically while seated but exhibiting a parkinsonian presentation while ambulating.   He was more recently seen by [facility] Dwayne Kays, PA-C) on 02/19/2023 for an evaluation of memory loss. At that time, memory difficulties  were said to be more evident during the past year. Primary examples surrounded trouble recalling names and details of recent conversations. There was also report of misplacing objects around his home. REM sleep behaviors had greatly diminished with the introduction of regular CPAP use. Prominent ADL dysfunction was not reported. Performance on a brief cognitive screening task (MOCA) was 22/30. Ultimately, Mr. Lecher was referred for a comprehensive neuropsychological evaluation to characterize his cognitive abilities and to assist with diagnostic clarity and treatment planning.   Neuroimaging: Head CT on 09/10/2022 was negative for any acute intracranial abnormality. There was no mention of atrophic patterns or microvascular ischemic disease in the associated report. As he has an implanted spinal cord stimulator, there are ongoing concerns surrounding MRI eligibility.   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 Donnie Aho   Chronic asthma 05/17/2011   Chronic low back pain with sciatica 11/14/2021   Chronic pain 11/21/2016   COPD (chronic obstructive pulmonary disease)    Dizziness 12/24/2021   Early satiety 09/11/2021   Erectile dysfunction    Esophageal motility disorder 09/11/2021   Generalized anxiety disorder    GERD (gastroesophageal reflux disease)    History of benign positional vertigo    History of colonic polyps 09/11/2021   History of kidney  stones    Hoarseness 09/11/2021   Hyperlipidemia    Hypertension    Hypogonadism male    prior use on Testim   Insomnia    Iron deficiency 09/11/2021   Left ventricular dysfunction 10/12/2018   Leg pain, left 11/14/2021   Lumbar disc disease    Major depressive disorder    Mild concentric left  ventricular hypertrophy (LVH)    Muscle spasm 11/14/2021   Old inferior myocardial infarction 1995   OSA (obstructive sleep apnea) 11/12/2022   NPSG 10/21/22- AHI 28.7/ hr, desat to 89%/Mean 93%, body weight 165 lbs     Pain in both feet 08/10/2021   Peripheral vascular disease 12/25/2021   Pneumonia    Presbycusis of both ears 04/23/2018   Shortness of breath dyspnea    Slow transit constipation 09/11/2021   Spinal cord stimulator status 11/14/2021   Spondylolisthesis of lumbar region 02/28/2015   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 Donnie Aho   CHEILECTOMY  10/16/2011   Procedure: CHEILECTOMY;  Surgeon: Sherri Rad, MD;  Location: [facility];  Service: Orthopedics;  Laterality: Right;   CHOLECYSTECTOMY     COLONOSCOPY     CORONARY ANGIOPLASTY     1995 after MI   CORONARY ANGIOPLASTY WITH STENT PLACEMENT     1999    ESOPHAGOGASTRODUODENOSCOPY  02/18/2012   Procedure: ESOPHAGOGASTRODUODENOSCOPY (EGD);  Surgeon: Florencia Reasons, MD;  Location: [facility];  Service: Endoscopy;  Laterality: N/A;   FOOT SURGERY     joint scraped, arthritis right foot   HERNIA REPAIR     LUMBAR DISC SURGERY     x2   right index finger  mass removed- arthritis     Dr. Amanda Pea   [facility]  Right 05/18/2021   medial toe consistent with callus   SKIN BIOPSY Left 05/17/2021   actinic keratosis   SPINAL CORD STIMULATOR BATTERY  EXCHANGE N/A 10/11/2021   Procedure: SPINAL CORD STIMULATOR BATTERY EXCHANGE;  Surgeon: Venita Lick, MD;  Location: [facility] OR;  Service: Orthopedics;  Laterality: N/A;  60 mins   SPINAL CORD STIMULATOR INSERTION N/A 11/21/2016   Procedure: LUMBAR SPINAL CORD STIMULATOR INSERTION;  Surgeon: Venita Lick, MD;  Location: [facility] OR;  Service: Orthopedics;  Laterality: N/A;  Requests 2.5 hrs   TONSILLECTOMY     1953   UPPER GI ENDOSCOPY  [id number]   VASECTOMY     1980    Current Outpatient Medications:    acetaminophen (TYLENOL) 500 MG tablet, Take 1,000 mg by mouth every 6 (six) hours as needed., Disp: , Rfl:    albuterol (PROAIR HFA) 108 (90 Base) MCG/ACT inhaler, Inhale 2 puffs into the lungs every 6 (six) hours as needed for wheezing or shortness of breath., Disp: 8.5 each, Rfl: 2   ALPRAZolam (XANAX) 0.25 MG tablet, Take 1 tablet (0.25 mg total) by mouth 2 (two) times daily as needed for anxiety., Disp: 30 tablet, Rfl: 1   atorvastatin (LIPITOR) 20 MG tablet, Take 1 tablet (20 mg total) by mouth daily., Disp: 90 tablet, Rfl: 3   azelastine (ASTELIN) 0.1 % nasal spray, Place 2 sprays into both nostrils 2 (two) times daily. Use in each nostril as directed, Disp: 90 mL, Rfl: 3   buPROPion (WELLBUTRIN XL) 300 MG 24 hr tablet, Take 1 tablet (300 mg total) by mouth daily., Disp: 90 tablet, Rfl: 1   calcium carbonate (OS-CAL) 600 MG TABS tablet, Take 600 mg by mouth daily with breakfast., Disp: , Rfl:    carbidopa-levodopa (SINEMET IR) 25-100 MG tablet, TAKE 1 TABLET BY MOUTH 3 (THREE) TIMES  DAILY. 8AM/NOON/4PM, Disp: 270 tablet, Rfl: 0   cetirizine (ZYRTEC) 10 MG tablet, Take 10 mg by mouth daily., Disp: , Rfl:    clopidogrel (PLAVIX) 75 MG tablet, Take 1 tablet (75 mg total) by mouth daily., Disp: 90 tablet, Rfl: 2   desvenlafaxine (PRISTIQ) 50 MG 24 hr tablet, Take 1 tablet (50 mg total) by mouth daily., Disp: 90 tablet, Rfl: 3   diltiazem (CARDIZEM CD) 240 MG 24 hr capsule, Take 1 capsule (240 mg total)  by mouth every other day., Disp: 45 capsule, Rfl: 3   ferrous sulfate 325 (65 FE) MG tablet, Take 325 mg by mouth daily., Disp: , Rfl:    Flaxseed, Linseed, (FLAX SEED OIL) 1000 MG CAPS, Take 1,000 mg by mouth every 7 (seven) days., Disp: , Rfl:    GLUCOSAMINE CHONDROITIN COMPLX PO, Take 1 tablet by mouth daily., Disp: , Rfl:    guaiFENesin (MUCINEX) 600 MG 12 hr tablet, Take 600 mg by mouth 2 (two) times daily as needed for cough., Disp: , Rfl:    meclizine (ANTIVERT) 25 MG tablet, TAKE 1 TWICE A DAY AS NEEDED FOR DIZZINESS, Disp: 180 tablet, Rfl: 0   methylphenidate (CONCERTA) 54 MG PO CR tablet, Take 1 tablet (54 mg total) by mouth every morning., Disp: 30 tablet, Rfl: 0   Multiple Vitamins-Minerals (MULTIVITAMIN WITH MINERALS) tablet, Take 1 tablet by mouth daily., Disp: , Rfl:    pantoprazole (PROTONIX) 40 MG tablet, Take 1 tablet (40 mg total) by mouth daily., Disp: 90 tablet, Rfl: 3   sucralfate (CARAFATE) 1 GM/10ML suspension, Take 10 mLs (1 g total) by mouth daily., Disp: 300 mL, Rfl: 3   umeclidinium-vilanterol (ANORO ELLIPTA) 62.5-25 MCG/ACT AEPB, Inhale 1 puff into the lungs daily at 6 (six) AM., Disp: 3 each, Rfl: 3  Clinical Interview:   The following information was obtained during a clinical interview with Mr. Saucerman and his wife prior to cognitive testing.  Cognitive Symptoms: Decreased short-term memory: Endorsed. His primary example surrounded the frequent misplacing of objects around his home. He noted difficulties being present for the past year or so but was unclear if this could be due to the normal aging process. His wife noted some trouble recalling details of recent conversations and names. She felt that difficulties had gradually worsened over the past several years.  Decreased long-term memory: Denied. Decreased attention/concentration: Endorsed. He was diagnosed with ADHD as an adult. He described longstanding deficits with sustained attention, distractibility, and  hyperactivity, dating back to adolescence and young adulthood. Deficits were said to directly interfere with academic and social pursuits at that time. Deficits have persisted to present day.  Reduced processing speed: Endorsed. Difficulties with executive functions: Endorsed. He reported longstanding deficits surrounding organization and multi-tasking. He generally denied trouble with impulsivity. His wife denied any prominent personality changes.  Difficulties with emotion regulation: Denied. Difficulties with receptive language: Denied. Difficulties with word finding: Denied. Decreased visuoperceptual ability: Endorsed. He primarily described some trouble with depth perception, noting that he frequently mis-steps and may trip while walking up steps.   Difficulties completing ADLs: He denied difficulties managing medications independently. His wife manages finances and bill paying which is longstanding in nature. He does not currently drive at the request of his wife and physical therapist. This is due to physical concerns (i.e., his right foot becoming stuck on the gas pedal) rather than cognitive concerns.   Additional Medical History: History of traumatic brain injury/concussion: Denied. He has experienced frequent falling due to tripping  or losing his balance over the years, with his most recent occurring this past April where he was briefly seen in the ED. He generally denied any physical injuries from said falls. No head injuries were reported.  History of stroke: Denied. History of seizure activity: Denied. History of known exposure to toxins: Denied. Symptoms of chronic pain: Endorsed. He described prominent lower back pain which, at times, can be debilitating and distracting.  Experience of frequent headaches/migraines: Denied. Frequent instances of dizziness/vertigo: Endorsed. He reported prior bouts of vertigo which have been quite substantial. Lately, symptoms have been managed well  and he has not had any recent flare-ups.   Sensory changes: He wears glasses and utilizes hearing aids with benefit. He also reported a chronically diminished sense of smell. The cause for the latter was unknown.  Balance/coordination difficulties: Endorsed. He reported mild stability and ambulates with a walker for added stability. His right side was said to exhibit greater dysfunction than his left, largely surrounding instances where he will experience a right foot drop. As stated above, there is a history of falls without reported injury.  Other motor difficulties: Mild tremors were said to impact his hands bilaterally.   Sleep History: Estimated hours obtained each night: 6-8 hours.  Difficulties falling asleep: Denied. Difficulties staying asleep: Denied. Feels rested and refreshed upon awakening: "Not particularly."  History of snoring: Endorsed. History of waking up gasping for air: Endorsed. Witnessed breath cessation while asleep: Endorsed. He has been diagnosed with obstructive sleep apnea and has been using a CPAP machine regularly for the past several months. This device has been challenging for him to get used to and he did not report any subjective improvement in energy levels.   History of vivid dreaming: Endorsed. Excessive movement while asleep: Endorsed. Instances of acting out his dreams: Endorsed. His wife noted that, historically, he has commonly made loud vocalizations and "thrashed" about throughout the night. However, she has noted only a single occurrence since he started using his CPAP machine several months ago.   Psychiatric/Behavioral Health History: Depression: He described his current mood as "better now." However, he also later stated "I know that I'm depressed." He noted that he has become far more sentimental lately, especially when reminiscing, and can easily be brought to tears. His wife noted that some degree of sentimentality has always been present. He  denied to his knowledge any formal mental health concerns or rendered diagnoses. Current or remote suicidal ideation, intent, or plan was denied.  Anxiety: Denied. Mania: Denied. Trauma History: Denied. Visual/auditory hallucinations: Denied. He did describe experiences where he feels he will see something quickly move in his peripheral vision and commonly ask "was that a mouse?" He denied actually seeing mice or experiencing fully-formed visual hallucinations.  Delusional thoughts: Denied.  Tobacco: Denied. Alcohol: He denied current alcohol consumption as well as a history of problematic alcohol abuse or dependence.  Recreational drugs: Denied.  Family History: Problem Relation Age of Onset   Diabetes Mother    Dementia Mother    Heart disease Mother    Parkinson's disease Mother    Heart disease Father    Dementia Father    Fibromyalgia Daughter    Cancer Neg Hx    This information was confirmed by Mr. Gowda.  Academic/Vocational History: Highest level of educational attainment: 18 years. He earned a Oncologist in business administration and a Master's degree in social work. He described himself as a somewhat poor student in early academic settings, attributed to  undiagnosed ADHD and trouble focusing. Academic performances improved over time as he was able to learn how to sustain his focus with some improvement. Math was noted as a significant weakness across subjects.  History of developmental delay: Denied. History of grade repetition: Denied. Enrollment in special education courses: Denied. History of LD: Denied.  Employment: Retired. 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   Evaluation Results:   Behavioral Observations: Mr. Dias was accompanied by his wife, arrived to his appointment on time, and was appropriately dressed and groomed. He appeared alert and  oriented. He ambulated somewhat slowly with the assistance of his walker. He maneuvered this device well. A very mild tremor was exhibited in his right hand sporadically during interview. His affect was generally relaxed and positive, but did range appropriately given the subject being discussed during the clinical interview or the task at hand during testing procedures. Spontaneous speech was fluent and word finding difficulties were not observed during the clinical interview. Thought processes were coherent, organized, and normal in content. Insight into his cognitive difficulties appeared adequate.   During testing, he commented having trouble switching his focus from task to task and did have a few perseverations. Sustained attention was adequate. Task engagement was adequate and he persisted when challenged. Overall, Mr. Dougall was cooperative with the clinical interview and subsequent testing procedures.   Adequacy of Effort: The validity of neuropsychological testing is limited by the extent to which the individual being tested may be assumed to have exerted adequate effort during testing. Mr. Gianni expressed his intention to perform to the best of his abilities and exhibited adequate task engagement and persistence. Scores across stand-alone and embedded performance validity measures were within expectation. As such, the results of the current evaluation are believed to be a valid representation of Mr. Bollig' current cognitive functioning.  Test Results: Mr. Depietro was largely oriented at the time of the current evaluation. He was two days off when stating the current date.  Intellectual abilities based upon educational and vocational attainment were estimated to be in the average to above average range. Premorbid abilities were estimated to be within the above average range based upon a single-word reading test.   Processing speed was variable, ranging from the exceptionally low to  average normative ranges. Basic attention was average to above average. More complex attention (e.g., working memory) was variable, ranging from the well below average to average normative ranges. Executive functioning was exceptionally low to well below average outside of an adequate performance in the average range across a task assessing abstract reasoning. He performed in the well above average normative range across a task assessing safety and judgment.  Assessed receptive language abilities were above average. Likewise, Mr. Marasigan did not exhibit any difficulties comprehending task instructions and answered all questions asked of him appropriately. Assessed expressive language was mildly variable. Phonemic fluency was below average to average, semantic fluency was well below average to average, and confrontation naming was average to above average.     Assessed visuospatial/visuoconstructional abilities were below average to average.    Learning (i.e., encoding) of novel verbal information was variable, ranging from the exceptionally low to average normative ranges. Spontaneous delayed recall (i.e., retrieval) of previously learned information was commensurate with performances across learning trials. Retention rates were 82% across a story learning task, 0% across a list learning task, and 50% across a figure drawing task.  Performance across recognition tasks was below average to average, suggesting evidence for information consolidation.   Results of emotional screening instruments suggested that recent symptoms of generalized anxiety were in the mild to moderate range, while symptoms of depression were within the mild range. A screening instrument assessing recent sleep quality suggested the presence of minimal sleep dysfunction.  Tables of Scores:   Note: This summary of test scores accompanies the interpretive report and should not be considered in isolation without reference to the  appropriate sections in the text. Descriptors are based on appropriate normative data and may be adjusted based on clinical judgment. Terms such as "Within Normal Limits" and "Outside Normal Limits" are used when a more specific description of the test score cannot be determined.       Percentile - Normative Descriptor > 98 - Exceptionally High 91-97 - Well Above Average 75-90 - Above Average 25-74 - Average 9-24 - Below Average 2-8 - Well Below Average < 2 - Exceptionally Low       Validity:   DESCRIPTOR       DCT: --- --- Within Normal Limits  RBANS EI: --- --- Within Normal Limits  WAIS-IV RDS: --- --- Within Normal Limits       Orientation:      Raw Score Percentile   NAB Orientation, Form 1 27/29 --- ---       Cognitive Screening:      Raw Score Percentile   SLUMS: 24/30 --- ---       RBANS, Form A: Standard Score/ Scaled Score Percentile   Total Score 83 13 Below Average  Immediate Memory 83 13 Below Average    List Learning 3 1 Exceptionally Low    Story Memory 11 63 Average  Visuospatial/Constructional 89 23 Below Average    Figure Copy 10 50 Average    Line Orientation 13/20 10-16 Below Average  Language 85 16 Below Average    Picture Naming 10/10 51-75 Average    Semantic Fluency 4 2 Well Below Average  Attention 85 16 Below Average    Digit Span 10 50 Average    Coding 5 5 Well Below Average  Delayed Memory 95 37 Average    List Recall 0/10 <2 Exceptionally Low    List Recognition 20/20 51-75 Average    Story Recall 10 50 Average    Story Recognition 12/12 69+ Average    Figure Recall 8 25 Average    Figure Recognition 5/8 21-29 Below Average to Average        Intellectual Functioning:      Standard Score Percentile   Test of Premorbid Functioning: 116 86 Above Average       Attention/Executive Function:     Trail Making Test (TMT): Raw Score (T Score) Percentile     Part A 50 secs.,  0 errors (39) 14 Below Average    Part B 199 secs.,  3 errors  (33) 5 Well Below Average         Scaled Score Percentile   WAIS-IV Digit Span: 8 25 Average    Forward 12 75 Above Average    Backward 9 37 Average    Sequencing 4 2 Well Below Average        Scaled Score Percentile   WAIS-IV Similarities: 9 37 Average       D-KEFS Color-Word Interference Test: Raw Score (Scaled Score) Percentile     Color Naming 50 secs. (3) 1 Exceptionally Low    Word Reading  26 secs. (10) 50 Average    Inhibition 86 secs. (8) 25 Average      Total Errors 26 errors (1) <1 Exceptionally Low    Inhibition/Switching Discontinued --- Impaired      Total Errors --- --- ---       D-KEFS Verbal Fluency Test: Raw Score (Scaled Score) Percentile     Letter Total Correct 33 (9) 37 Average    Category Total Correct 27 (8) 25 Average    Category Switching Total Correct 0 (1) <1 Exceptionally Low    Category Switching Accuracy 0 (1) <1 Exceptionally Low      Total Set Loss Errors 4 (8) 25 Average      Total Repetition Errors 1 (12) 75 Above Average       NAB Executive Functions Module, Form 1: T Score Percentile     Judgment 64 92 Well Above Average       Language:     Verbal Fluency Test: Raw Score (T Score) Percentile     Phonemic Fluency (FAS) 33 (40) 16 Below Average    Animal Fluency 16 (42) 21 Below Average        NAB Language Module, Form 1: T Score Percentile     Auditory Comprehension 57 75 Above Average    Naming 31/31 (59) 82 Above Average       Visuospatial/Visuoconstruction:      Raw Score Percentile   Clock Drawing: 10/10 --- Within Normal Limits        Scaled Score Percentile   WAIS-IV Block Design: 9 37 Average       Mood and Personality:      Raw Score Percentile   Geriatric Depression Scale: 11 --- Mild  Geriatric Anxiety Scale: 20 --- Mild    Somatic 7 --- Mild    Cognitive 6 --- Mild    Affective 7 --- Moderate       Additional Questionnaires:      Raw Score Percentile   PROMIS Sleep Disturbance Questionnaire: 19 --- None to Slight    Informed Consent and Coding/Compliance:   The current evaluation represents a clinical evaluation for the purposes previously outlined by the referral source and is in no way reflective of a forensic evaluation.   Mr. Kozlowski was provided with a verbal description of the nature and purpose of the present neuropsychological evaluation. Also reviewed were the foreseeable risks and/or discomforts and benefits of the procedure, limits of confidentiality, and mandatory reporting requirements of this provider. The patient was given the opportunity to ask questions and receive answers about the evaluation. Oral consent to participate was provided by the patient.   This evaluation was conducted by Newman Nickels, Ph.D., ABPP-CN, board certified clinical neuropsychologist. Mr. Mausolf completed a clinical interview with Dr. Milbert Coulter, billed as one unit [phone], and 130 minutes of cognitive testing and scoring, billed as one unit [phone] and three additional units [id number]. Psychometrist Wallace Keller, B.S. assisted Dr. Milbert Coulter with test administration and scoring procedures. As a separate and discrete service, one unit [id number] and two units [phone] were billed for Dr. Tammy Sours time spent in interpretation and report writing.

## 2023-05-05 NOTE — Telephone Encounter (Signed)
Recv'd call from [facility] that pt states atorvastatin was decreased to 10mg , Need new Rx for Atorvastatin 10mg  [facility]

## 2023-05-06 NOTE — Telephone Encounter (Signed)
[employer] left a message stating that pt told them that you reduced his atorvastatin, I don't see that anywhere in the notes about changing the dosage.  Please advise?

## 2023-05-06 NOTE — Telephone Encounter (Signed)
ok 

## 2023-05-14 NOTE — Progress Notes (Unsigned)
Assessment/Plan:   1.  Vascular Parkinsonism             -I had a long discussion with the patient and family.  I told the patient that I do not see evidence of idiopathic parkinsons disease but I do think that it is possible that the patient has vascular parkinsonism.  This is evidenced by the fact that the patient looks pretty good clinically when sitting down but looks more parkinsonian when ambulating, with short shuffling steps.  He is unable to have an MRI because of spinal cord stimulator, so we are unable to assess degree of small vessel disease.  He did have a CT brain in April, 2024.             -Refer to physical therapy   2.  Chronic vertigo             -This has been going on for many years             -on meclizine   3.  MCI             -suspect due to meds             -Neurocognitive testing with Dr. Milbert Coulter confirmed MCI in November, 2024.  He mentioned medications as a role in diminishing cognition.  Subjective:   [name] was seen today in follow up for vascular parkinsonism.  Records reviewed since last visit.  We discussed last visit that often times vascular parkinsonism does not respond to medications.  He decided to go ahead and try levodopa to see if it would be helpful at all.  He was also sent to physical therapy.  He reports today that ***.  Since our last visit, the patient did have neurocognitive testing done with Dr. Milbert Coulter.  This was done November 25 and demonstrated MCI.  Current prescribed movement disorder medications: ***   PREVIOUS MEDICATIONS: {Parkinson's RX:18200}  ALLERGIES:   Allergies  Allergen Reactions   Oxycodone Nausea And Vomiting and Other (See Comments)    CURRENT MEDICATIONS:  No outpatient medications have been marked as taking for the 05/19/23 encounter (Appointment) with Arelly Whittenberg, Octaviano Batty, DO.     Objective:   PHYSICAL EXAMINATION:    VITALS:  There were no vitals filed for this visit.  GEN:  The patient appears  stated age and is in NAD. HEENT:  Normocephalic, atraumatic.  The mucous membranes are moist. The superficial temporal arteries are without ropiness or tenderness. CV:  RRR Lungs:  CTAB Neck/HEME:  There are no carotid bruits bilaterally.  Neurological examination:  Orientation: The patient is alert and oriented x3. Cranial nerves: There is good facial symmetry with*** facial hypomimia. The speech is fluent and clear. Soft palate rises symmetrically and there is no tongue deviation. Hearing is intact to conversational tone. Sensation: Sensation is intact to light touch throughout Motor: Strength is at least antigravity x4.  Movement examination: Tone: There is nl tone in the bilateral upper extremities.  The tone in the lower extremities is nl.  Abnormal movements: none even with distraction procedures Coordination:  There is no decremation with RAM's, with any form of RAMS, including alternating supination and pronation of the forearm, hand opening and closing, finger taps, heel taps and toe taps.  Gait and Station: The patient has no difficulty arising out of a deep-seated chair without the use of the hands. The patient's stride length is decreased and he slightly drags the R  leg.  He shuffles the more he ambulates.  He is forward flexed.    I have reviewed and interpreted the following labs independently    Chemistry      Component Value Date/Time   NA 138 09/09/2022 2041   NA 142 07/09/2022 1230   K 3.2 (L) 09/09/2022 2041   CL 104 09/09/2022 2041   CO2 23 09/09/2022 2041   BUN 7 (L) 09/09/2022 2041   BUN 11 07/09/2022 1230   CREATININE 0.90 09/09/2022 2041   CREATININE 1.07 10/15/2016 1056      Component Value Date/Time   CALCIUM 9.0 09/09/2022 2041   ALKPHOS 51 09/09/2022 2041   AST 22 09/09/2022 2041   ALT 24 09/09/2022 2041   BILITOT 0.4 09/09/2022 2041   BILITOT 0.3 07/09/2022 1230       Lab Results  Component Value Date   WBC 10.8 (H) 09/09/2022   HGB 13.6  09/09/2022   HCT 41.4 09/09/2022   MCV 85.7 09/09/2022   PLT 189 09/09/2022    Lab Results  Component Value Date   TSH 0.69 02/19/2023     Total time spent on today's visit was ***30 minutes, including both face-to-face time and nonface-to-face time.  Time included that spent on review of records (prior notes available to me/labs/imaging if pertinent), discussing treatment and goals, answering patient's questions and coordinating care.  Cc:  Ronnald Nian, MD

## 2023-05-16 ENCOUNTER — Ambulatory Visit: Payer: Medicare Other | Admitting: Physician Assistant

## 2023-05-18 NOTE — Telephone Encounter (Signed)
done

## 2023-05-19 ENCOUNTER — Ambulatory Visit (INDEPENDENT_AMBULATORY_CARE_PROVIDER_SITE_OTHER): Payer: Medicare Other | Admitting: Psychology

## 2023-05-19 ENCOUNTER — Ambulatory Visit: Payer: Medicare Other | Admitting: Neurology

## 2023-05-19 DIAGNOSIS — R41840 Attention and concentration deficit: Secondary | ICD-10-CM | POA: Diagnosis not present

## 2023-05-19 DIAGNOSIS — G214 Vascular parkinsonism: Secondary | ICD-10-CM | POA: Diagnosis not present

## 2023-05-19 NOTE — Progress Notes (Unsigned)
Assessment/Plan:   1.  Vascular Parkinsonism             -I had a long discussion with the patient and family.  I told the patient that I do not see evidence of idiopathic parkinsons disease but I do think that it is possible that the patient has vascular parkinsonism.  This is evidenced by the fact that the patient looks pretty good clinically when sitting down but looks more parkinsonian when ambulating, with short shuffling steps.  He is unable to have an MRI because of spinal cord stimulator, so we are unable to assess degree of small vessel disease.  He did have a CT brain in April, 2024.  -discussed exercise classes  -discussed walker at all times.  Shown U step today  -will call me in late feb/early march and we can order PT.    -exercise!  -d/c levodopa for now.  Effectively only taking 1 per day since missing middle of the day dosage and the last one is at bedtime.  Will let me know if they think worse after d/c.        2.  Chronic vertigo             -This has been going on for many years             -on meclizine   3.  MCI             -suspect due to meds             -Neurocognitive testing with Dr. Milbert Coulter confirmed MCI in November, 2024.  He mentioned medications as a role in diminishing cognition.  Subjective:   [name] was seen today in follow up for vascular parkinsonism.  Records reviewed since last visit.  Pt with wife who supplements hx.  We discussed last visit that often times vascular parkinsonism does not respond to medications.  He decided to go ahead and try levodopa to see if it would be helpful at all.  He was also sent to physical therapy.  He reports today that he isn't sure its helping but admits that he forgets the middle of the day dosage and is taking the last one at bed.    They do note that festination is better and he hasn't fallen.  He loved PT and hopes to do PT in the future again.  Since our last visit, the patient did have neurocognitive  testing done with Dr. Milbert Coulter.  This was done November 25 and demonstrated MCI.   ALLERGIES:   Allergies  Allergen Reactions   Oxycodone Nausea And Vomiting and Other (See Comments)    CURRENT MEDICATIONS:  Current Meds  Medication Sig   acetaminophen (TYLENOL) 500 MG tablet Take 1,000 mg by mouth every 6 (six) hours as needed.   albuterol (PROAIR HFA) 108 (90 Base) MCG/ACT inhaler Inhale 2 puffs into the lungs every 6 (six) hours as needed for wheezing or shortness of breath.   ALPRAZolam (XANAX) 0.25 MG tablet Take 1 tablet (0.25 mg total) by mouth 2 (two) times daily as needed for anxiety.   atorvastatin (LIPITOR) 20 MG tablet Take 1 tablet (20 mg total) by mouth daily.   azelastine (ASTELIN) 0.1 % nasal spray Place 2 sprays into both nostrils 2 (two) times daily. Use in each nostril as directed   buPROPion (WELLBUTRIN XL) 300 MG 24 hr tablet Take 1 tablet (300 mg total) by mouth daily.   carbidopa-levodopa (  SINEMET IR) 25-100 MG tablet TAKE 1 TABLET BY MOUTH 3 (THREE) TIMES DAILY. 8AM/NOON/4PM     Objective:   PHYSICAL EXAMINATION:    VITALS:   Vitals:   05/20/23 1254  BP: 126/74  Pulse: 64  SpO2: 99%  Weight: 167 lb (75.8 kg)  Height: 5\' 6"  (1.676 m)    GEN:  The patient appears stated age and is in NAD. HEENT:  Normocephalic, atraumatic.  The mucous membranes are moist. The superficial temporal arteries are without ropiness or tenderness. CV:  RRR Lungs:  CTAB Neck/HEME:  There are no carotid bruits bilaterally.  Neurological examination:  Orientation: The patient is alert and oriented x3. Cranial nerves: There is good facial symmetry without facial hypomimia. The speech is fluent and clear. Soft palate rises symmetrically and there is no tongue deviation. Hearing is intact to conversational tone. Sensation: Sensation is intact to light touch throughout Motor: Strength is at least antigravity x4.  Movement examination: Tone: There is nl tone in the bilateral upper  extremities.  The tone in the lower extremities is nl.  Abnormal movements: none even with distraction procedures Coordination:  There is no decremation with RAM's, with any form of RAMS, including alternating supination and pronation of the forearm, hand opening and closing, finger taps, heel taps and toe taps.  Gait and Station: The patient has no difficulty arising out of a deep-seated chair without the use of the hands. The patient's stride length is decreased and he slightly drags the R leg.  He shuffles the more he ambulates.  He is forward flexed.  This is stable from prior visit.    I have reviewed and interpreted the following labs independently    Chemistry      Component Value Date/Time   NA 138 09/09/2022 2041   NA 142 07/09/2022 1230   K 3.2 (L) 09/09/2022 2041   CL 104 09/09/2022 2041   CO2 23 09/09/2022 2041   BUN 7 (L) 09/09/2022 2041   BUN 11 07/09/2022 1230   CREATININE 0.90 09/09/2022 2041   CREATININE 1.07 10/15/2016 1056      Component Value Date/Time   CALCIUM 9.0 09/09/2022 2041   ALKPHOS 51 09/09/2022 2041   AST 22 09/09/2022 2041   ALT 24 09/09/2022 2041   BILITOT 0.4 09/09/2022 2041   BILITOT 0.3 07/09/2022 1230       Lab Results  Component Value Date   WBC 10.8 (H) 09/09/2022   HGB 13.6 09/09/2022   HCT 41.4 09/09/2022   MCV 85.7 09/09/2022   PLT 189 09/09/2022    Lab Results  Component Value Date   TSH 0.69 02/19/2023     Total time spent on today's visit was 32 minutes, including both face-to-face time and nonface-to-face time.  Time included that spent on review of records (prior notes available to me/labs/imaging if pertinent), discussing treatment and goals, answering patient's questions and coordinating care.  Cc:  Ronnald Nian, MD

## 2023-05-19 NOTE — Progress Notes (Signed)
   Neuropsychology Feedback Session [name]. [facility] [name]  Reason for Referral:   [name] is a 77 y.o. right-handed Caucasian male referred by [name], PA-C, to characterize his current cognitive functioning and assist with diagnostic clarity and treatment planning in the context of vascular parkinsonism, ADHD, and subjective cognitive decline.   Feedback:   [name] completed a comprehensive neuropsychological evaluation on 05/05/2023. Please refer to that encounter for the full report and recommendations. Briefly, results suggested impairment surrounding executive functioning. Performance variability was further exhibited across processing speed, complex attention, semantic fluency, and both encoding (i.e., learning) and delayed retrieval aspects of memory. The etiology for ongoing cognitive dysfunction is multifactorial in nature. Neurodevelopmentally, he described longstanding difficulties with sustained attention, distractibility, and hyperactivity dating back to adolescence and early adulthood. Difficulties were said to directly impact academic performance, as well as social pursuits. He was eventually diagnosed with ADHD as an adult. Performances across cognitive testing can certainly be seen in individuals with dysregulated attentional networks and this history is likely a primary contributor to ongoing dysfunction. Neurologically speaking, Dr. Arbutus Leas has previously theorized a vascular parkinsonian presentation being more likely than idiopathic Parkinson's disease. Given his spinal cord stimulator implant, the extent of microvascular disease likely cannot be better discerned via a brain MRI to help with diagnostic clarity. Given his degree of cardiovascular medical ailments and his predominantly lower body clinical presentation, the former presentation certainly seems a reasonable conceptualization. In either case, [name]' current  testing patterns are also fairly consistent with typical patterns seen across these illnesses. Taken together, the most likely culprit for objective dysfunction across testing and subjective concerns in his day-to-day life would be his parkinsonian presentation superimposed on ADHD. Further daily experiences surrounding chronic pain, sleep dysfunction, and mild psychiatric distress would serve to worsen dysfunction. Medication side effects, namely alprazolam/Xanax and meclizine, may also play a role in diminishing cognitive functioning.  [name] was accompanied by his wife during the current feedback session. Content of the current session focused on the results of his neuropsychological evaluation. [name] was given the opportunity to ask questions and his questions were answered. He was encouraged to reach out should additional questions arise. A copy of his report was provided at the conclusion of the visit.      One unit [phone] was billed for Dr. Tammy Sours time spent preparing for, conducting, and documenting the current feedback session with [name].

## 2023-05-20 ENCOUNTER — Telehealth: Payer: Self-pay

## 2023-05-20 ENCOUNTER — Encounter: Payer: Self-pay | Admitting: Neurology

## 2023-05-20 ENCOUNTER — Ambulatory Visit (INDEPENDENT_AMBULATORY_CARE_PROVIDER_SITE_OTHER): Payer: Medicare Other | Admitting: Neurology

## 2023-05-20 VITALS — BP 126/74 | HR 64 | Ht 66.0 in | Wt 167.0 lb

## 2023-05-20 DIAGNOSIS — G214 Vascular parkinsonism: Secondary | ICD-10-CM | POA: Diagnosis not present

## 2023-05-20 DIAGNOSIS — G3184 Mild cognitive impairment, so stated: Secondary | ICD-10-CM

## 2023-05-20 DIAGNOSIS — F909 Attention-deficit hyperactivity disorder, unspecified type: Secondary | ICD-10-CM

## 2023-05-20 MED ORDER — METHYLPHENIDATE HCL ER (OSM) 54 MG PO TBCR: 54.0000 mg | EXTENDED_RELEASE_TABLET | ORAL | 0 refills | Status: DC

## 2023-05-20 NOTE — Telephone Encounter (Signed)
Pt left a voicemail for a refill on [facility] to [facility] [address]. Last appt. 01/01/23. Next appt. 01/14/24.

## 2023-05-27 ENCOUNTER — Ambulatory Visit: Payer: Medicare Other | Admitting: Physician Assistant

## 2023-05-29 ENCOUNTER — Ambulatory Visit: Payer: Medicare Other | Admitting: Physician Assistant

## 2023-05-30 ENCOUNTER — Encounter: Payer: Self-pay | Admitting: Physician Assistant

## 2023-05-30 ENCOUNTER — Ambulatory Visit (INDEPENDENT_AMBULATORY_CARE_PROVIDER_SITE_OTHER): Payer: Medicare Other | Admitting: Physician Assistant

## 2023-05-30 VITALS — BP 125/79 | HR 62 | Resp 20 | Ht 66.0 in

## 2023-05-30 DIAGNOSIS — G3184 Mild cognitive impairment, so stated: Secondary | ICD-10-CM

## 2023-05-30 DIAGNOSIS — G214 Vascular parkinsonism: Secondary | ICD-10-CM

## 2023-05-30 DIAGNOSIS — R41840 Attention and concentration deficit: Secondary | ICD-10-CM

## 2023-05-30 HISTORY — DX: Mild cognitive impairment of uncertain or unknown etiology: G31.84

## 2023-05-30 HISTORY — DX: Attention and concentration deficit: R41.840

## 2023-05-30 NOTE — Progress Notes (Signed)
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  Seen today in follow up for memory  concerns. He reports that his memory is stable, and he is Patient is able to participate on his ADLs and to drive without difficulties.       Follow up in  6-9  months. Repeat neuropsych evaluation in 18 to 24 months for clarity of diagnosis and disease trajectory No indication for antidementia medication at this time. Continue CPAP for OSA Recommend psychotherapy to address symptoms of psychiatric distress  Recommend good control of her cardiovascular risk factors Continue to control mood as per PCP, recommend evaluation for ADHD  Vascular parkinsonism, stable Tremors well-controlled with current regimen, no new parkinsonian signs noted.  He is followed at the movement disorder clinic Per notes, carbidopa-levodopa has been discontinued for now, he is on observation only. He is to resume physical therapy and speech therapy soon Follow-up with Dr. Arbutus Leas on June 2025  Subjective:    This patient is here alone.  Previous records as well as any outside records available were reviewed prior to todays visit. Patient was last seen on 02/19/2023 with a MoCA of 22/30    Any changes in memory since last visit?  "About the same, maybe better". Admits to not be reading as much as before  repeats oneself?  Endorsed Disoriented when walking into a room?  Patient denies    Leaving objects?  May misplace things but not in unusual places   Wandering behavior?  denies   Any personality changes since last visit?  Denies. He does have some anxiety and is entertaining psychotherapy   Any worsening depression?:  Denies.   Hallucinations or paranoia?  Denies.   Seizures? denies    Any sleep changes?  Sleeps well. Denies vivid dreams, REM behavior or sleepwalking   Sleep apnea? Uses his CPAP nightly   Any hygiene concerns? Denies.  Independent of bathing and dressing?  Endorsed  Does the patient needs help with medications?  Wife  is in charge   Who is in charge of the finances?  Wife  is in charge     Any changes in appetite?  Denies.     Patient have trouble swallowing? Denies.   Does the patient cook? No Any headaches?   denies   Chronic back pain  denies   Ambulates with difficulty? He uses a walker for stability   Recent falls or head injuries? denies     Unilateral weakness, numbness or tingling? denies   Any tremors?  They are stable, intermittent. During this visit her does not report any tremors.  Any anosmia?  Denies   Any incontinence of urine?  Endorsed, does not wear pads  Any bowel dysfunction?   Denies      Patient lives with  his wife   Does the patient drive?  He does not drive, "my wife does not  let me"  Most recent CT of the head from April 2024 (spinal stimulator, not MRI compatible), personally reviewed, was remarkable for global left greater than right mild atrophy mild chronic ischemic changes, no intracranial abnormalities   Initial visit 02/19/2023 How long did patient have memory difficulties? For the last year. Wife initially thought this was due to ADD, but changes became more evident over the last year. Patient has some difficulty remembering recent conversations and people  names.  He did post cards for his family reunion and he was "stuck on the same subject". He had speech difficulties which had included dysarthria with some hypophonia, but after attending speech therapy sessions, this improved significantly.  He loves crossword puzzles, reading, crime TV.  repeats oneself?  Endorsed, especially with appointments.  Disoriented when walking into a room?  Patient denies    Leaving objects in unusual places?  Denies. Occasionally he misplaces the cell or keys but not in unusual places.   Wandering behavior? Denies.   Any personality changes ? Denies. "Thinking if a task, if I cannot complete it may irritate me". My voice, hoarseness does not let me sing well at [facility] and causes me some stress" Any history of depression?: "Occasionally I am depressed, more like anxiety" Hallucinations or paranoia?  "Sometimes I see something moving and nothing is there, but it may be floaters" "He has been hearing things , such as mice in the attic, even exterminators came in and there was nothing there". Seizures? Denies.    Any sleep changes?  Sleeps better with the CPAP. Reports vivid dreams not that often since using the CPAP, in the past he was doing shadowboxing but not recently. Denies sleepwalking   Any hygiene concerns?  Denies.   Independent of bathing and dressing? Endorsed  Does the patient need help with medications? Wife is in charge because he was missing pills because "it was too much trouble"   Who is in charge of the finances? Wife  is in charge     Any changes in appetite? Not as much as before, "sometimes I  am not hungry ".  Tries to drink more water because of dry mouth.    Patient have trouble swallowing? In the past he did, but after ST this has improved.  Does the patient cook? Yes, denies any accidents  Any headaches?  Denies.   Chronic pain? He has arthritis with CBP and has a back stimulator and injections, last 1 week ago (Dr. Ethelene Hal)   Ambulates with  difficulty?  He has been seen at our movement disorder clinic after noticing some difficulty with ambulation, with chronic change gait forward leaning trunk, as well as intermittent tremors.  After evaluation, it was felt that have vascular parkinsonism.  He denies any significant changes since his last visit.   recent falls or head injuries? Denies after PT . He now uses a walker since 11/2022 for stability      Vision changes? He reports floaters, has regular checkups.Denies double vision Unilateral weakness, numbness or tingling? Denies.   Any anosmia? Endorsed, for at least 1 year Any incontinence of urine? Denies.    Any bowel dysfunction? Denies.  Patient lives with wife   History of heavy alcohol intake? denies   History of heavy tobacco use? denies   Family history of dementia? Both with dementia ?type Does patient drive? Yes, denies getting lost   Retired Psychologist, sport and exercise at [employer], retired 2004   PREVIOUS MEDICATIONS:  CURRENT MEDICATIONS:  Outpatient Encounter Medications as of 05/30/2023  Medication Sig   acetaminophen (TYLENOL) 500 MG tablet Take 1,000 mg by mouth every 6 (six) hours as needed.   albuterol (PROAIR HFA) 108 (90 Base) MCG/ACT inhaler Inhale 2 puffs into the lungs every 6 (six) hours as needed for wheezing or shortness of breath.   ALPRAZolam (XANAX) 0.25 MG tablet Take 1 tablet (0.25 mg total) by mouth 2 (two) times daily as needed for anxiety.   atorvastatin (LIPITOR) 20 MG tablet Take 1 tablet (20 mg total) by mouth daily.   azelastine (ASTELIN) 0.1 % nasal spray Place 2 sprays into both nostrils 2 (two) times daily. Use in each nostril as directed   buPROPion (WELLBUTRIN XL) 300 MG 24 hr tablet Take 1 tablet (300 mg total) by mouth daily.   calcium carbonate (OS-CAL) 600 MG TABS tablet Take 600 mg by mouth daily with breakfast.   cetirizine (ZYRTEC) 10 MG tablet Take 10 mg by mouth daily.   clopidogrel (PLAVIX) 75 MG tablet Take 1 tablet (75 mg  total) by mouth daily.   desvenlafaxine (PRISTIQ) 50 MG 24 hr tablet Take 1 tablet (50 mg total) by mouth daily.   diltiazem (CARDIZEM CD) 240 MG 24 hr capsule Take 1 capsule (240 mg total) by mouth every other day.   ferrous sulfate 325 (65 FE) MG tablet Take 325 mg by mouth daily.   Flaxseed, Linseed, (FLAX SEED OIL) 1000 MG CAPS Take 1,000 mg by mouth every 7 (seven) days.   GLUCOSAMINE CHONDROITIN COMPLX PO Take 1 tablet by mouth daily.   guaiFENesin (MUCINEX) 600 MG 12 hr tablet Take 600 mg by mouth 2 (two) times daily as needed for cough.   meclizine (ANTIVERT) 25 MG tablet TAKE 1 TWICE A DAY AS NEEDED FOR DIZZINESS   methylphenidate (CONCERTA) 54 MG PO CR tablet Take 1 tablet (54 mg total) by mouth every morning.   Multiple Vitamins-Minerals (MULTIVITAMIN WITH MINERALS) tablet Take 1 tablet by mouth daily.   pantoprazole (PROTONIX) 40 MG tablet Take 1 tablet (40 mg total) by mouth daily.   sucralfate (CARAFATE) 1 GM/10ML suspension Take 10 mLs (1 g total) by mouth daily.   umeclidinium-vilanterol (ANORO ELLIPTA) 62.5-25 MCG/ACT AEPB Inhale 1 puff into the lungs daily at 6 (six) AM.   No facility-administered encounter medications on file as of 05/30/2023.       01/01/2023   10:22 AM  MMSE - Mini Mental State Exam  Orientation to time 5  Orientation to Place 3  Registration 3  Attention/ Calculation 4  Recall 3  Language- name 2 objects 2  Language- repeat 1  Language- follow 3 step command 3  Language- read & follow direction 1  Write a sentence 1  Copy design 1  Total score 27      02/19/2023   11:00 AM  Montreal Cognitive Assessment   Visuospatial/ Executive (0/5) 4  Naming (0/3) 3  Attention: Read list of digits (0/2) 2  Attention: Read list of letters (0/1) 1  Attention: Serial 7 subtraction starting at 100 (0/3) 1  Language: Repeat phrase (0/2) 2  Language : Fluency (0/1) 0  Abstraction (0/2) 1  Delayed Recall (0/5) 3  Orientation (0/6) 5  Total 22  Adjusted  Score (based on education) 22    Objective:     PHYSICAL EXAMINATION:    VITALS:   Vitals:   05/30/23 1503  BP: 125/79  Pulse: 62  Resp: 20  SpO2: 97%  Height: 5\' 6"  (1.676 m)    GEN:  The patient appears stated age and is in NAD. HEENT:  Normocephalic, atraumatic.   Neurological examination:  General: NAD, well-groomed, appears stated age. Orientation: The patient is alert. Oriented to person, place and not to date Cranial nerves: There is good facial symmetry.The speech is fluent and clear. No aphasia or dysarthria. Fund of knowledge is appropriate. Recent memory impaired, remote memory normal.  Attention and concentration are reduced.  Able to name objects and repeat phrases.  Hearing is intact to conversational tone.  Sensation: Sensation is intact to light touch throughout Motor: Strength is at least antigravity x4. DTR's 2/4 in UE/LE     Movement examination: Tone: There is normal tone in the UE/LE Abnormal movements:  no tremors noted today .No myoclonus.  No asterixis.   Coordination:  There is no decremation with RAM's. Normal finger to nose  Gait and Station: The patient has no difficulty arising out of a deep-seated chair without the use of the hands. The patient's stride length is short.  He flexes forward. Uses a walker to ambulate. Gait is cautious and narrow.    Thank you for allowing [place] the opportunity to participate in the care of this nice patient. Please do not hesitate to contact us for any questions or concerns.   Total time spent on today's visit was 30 minutes dedicated to this patient today, preparing to see patient, examining the patient, ordering tests and/or medications and counseling the patient, documenting clinical information in the EHR or other health record, independently interpreting results and communicating results to the patient/family, discussing treatment and goals, answering patient's questions and coordinating care.  Cc:  Ronnald Nian, MD  [name] 05/30/2023 5:54 PM

## 2023-05-30 NOTE — Patient Instructions (Addendum)
It was a pleasure to see you today at our office.   Recommendations:   Recommend follow psychotherapy for anxiety /depression and follow with pimary doctor ADD Follow up in [address] at 11:30  Follow up w Dr. Arbutus Leas as scheduled in June 2025   For psychiatric meds, mood meds: Please have your primary care physician manage these medications.  If you have any severe symptoms of a stroke, or other severe issues such as confusion,severe chills or fever, etc call 911 or go to the ER as you may need to be evaluated further  For guidance regarding [facility] and if placement were needed at the facility, contact Social Worker tel: [phone]  For assessment of decision of mental capacity and competency:  Call Dr. Erick Blinks, geriatric psychiatrist at [phone]  Counseling regarding caregiver distress, including caregiver depression, anxiety and issues regarding community resources, adult day care programs, adult living facilities, or memory care questions:  please contact your  Primary Doctor's Social Worker   Whom to call: Memory  decline, memory medications: Call our office [phone]    [email or web address]   RECOMMENDATIONS FOR ALL PATIENTS WITH MEMORY PROBLEMS: 1. Continue to exercise (Recommend 30 minutes of walking everyday, or 3 hours every week) 2. Increase social interactions - continue going to [place] and enjoy social gatherings with friends and family 3. Eat healthy, avoid fried foods and eat more fruits and vegetables 4. Maintain adequate blood pressure, blood sugar, and blood cholesterol level. Reducing the risk of stroke and cardiovascular disease also helps promoting better memory. 5. Avoid stressful situations. Live a simple life and avoid aggravations. Organize your time and prepare for the next day in anticipation. 6. Sleep well, avoid any interruptions of sleep and avoid any distractions in the  bedroom that may interfere with adequate sleep quality 7. Avoid sugar, avoid sweets as there is a strong link between excessive sugar intake, diabetes, and cognitive impairment We discussed the Mediterranean diet, which has been shown to help patients reduce the risk of progressive memory disorders and reduces cardiovascular risk. This includes eating fish, eat fruits and green leafy vegetables, nuts like almonds and hazelnuts, walnuts, and also use olive oil. Avoid fast foods and fried foods as much as possible. Avoid sweets and sugar as sugar use has been linked to worsening of memory function.  There is always a concern of gradual progression of memory problems. If this is the case, then we may need to adjust level of care according to patient needs. Support, both to the patient and caregiver, should then be put into place.      You have been referred for a neuropsychological evaluation (i.e., evaluation of memory and thinking abilities). Please bring someone with you to this appointment if possible, as it is helpful for the doctor to hear from both you and another adult who knows you well. Please bring eyeglasses and hearing aids if you wear them.    The evaluation will take approximately 3 hours and has two parts:   The first part is a clinical interview with the neuropsychologist (Dr. Milbert Coulter or Dr. Roseanne Reno). During the interview, the neuropsychologist will speak with you and the individual you brought to the appointment.    The second part of the evaluation is testing with the doctor's technician Annabelle Harman or Selena Batten). During the testing, the technician will ask you to remember different types of material, solve problems, and answer some questionnaires. Your family member will not be present for this portion of  the evaluation.   Please note: We must reserve several hours of the neuropsychologist's time and the psychometrician's time for your evaluation appointment. As such, there is a No-Show fee of  $100. If you are unable to attend any of your appointments, please contact our office as soon as possible to reschedule.      DRIVING: Regarding driving, in patients with progressive memory problems, driving will be impaired. We advise to have someone else do the driving if trouble finding directions or if minor accidents are reported. Independent driving assessment is available to determine safety of driving.   If you are interested in the driving assessment, you can contact the following:  [facility] in [place] [phone]  [facility] [phone]  [facility] [phone]  [facility] [phone] or [phone]   FALL PRECAUTIONS: Be cautious when walking. Scan the area for obstacles that may increase the risk of trips and falls. When getting up in the mornings, sit up at the edge of the bed for a few minutes before getting out of bed. Consider elevating the bed at the head end to avoid drop of blood pressure when getting up. Walk always in a well-lit room (use night lights in the walls). Avoid area rugs or power cords from appliances in the middle of the walkways. Use a walker or a cane if necessary and consider physical therapy for balance exercise. Get your eyesight checked regularly.  FINANCIAL OVERSIGHT: Supervision, especially oversight when making financial decisions or transactions is also recommended.  HOME SAFETY: Consider the safety of the kitchen when operating appliances like stoves, microwave oven, and blender. Consider having supervision and share cooking responsibilities until no longer able to participate in those. Accidents with firearms and other hazards in the house should be identified and addressed as well.   ABILITY TO BE LEFT ALONE: If patient is unable to contact 911 operator, consider using LifeLine, or when the need is there, arrange for someone to stay with patients. Smoking is a fire hazard, consider  supervision or cessation. Risk of wandering should be assessed by caregiver and if detected at any point, supervision and safe proof recommendations should be instituted.  MEDICATION SUPERVISION: Inability to self-administer medication needs to be constantly addressed. Implement a mechanism to ensure safe administration of the medications.      Mediterranean Diet A Mediterranean diet refers to food and lifestyle choices that are based on the traditions of countries located on the [place]. This way of eating has been shown to help prevent certain conditions and improve outcomes for people who have chronic diseases, like kidney disease and heart disease. What are tips for following this plan? Lifestyle  Cook and eat meals together with your family, when possible. Drink enough fluid to keep your urine clear or pale yellow. Be physically active every day. This includes: Aerobic exercise like running or swimming. Leisure activities like gardening, walking, or housework. Get 7-8 hours of sleep each night. If recommended by your health care provider, drink red wine in moderation. This means 1 glass a day for nonpregnant women and 2 glasses a day for men. A glass of wine equals 5 oz (150 mL). Reading food labels  Check the serving size of packaged foods. For foods such as rice and pasta, the serving size refers to the amount of cooked product, not dry. Check the total fat in packaged foods. Avoid foods that have saturated fat or trans fats. Check the ingredients list for added sugars, such  as corn syrup. Shopping  At the grocery store, buy most of your food from the areas near the walls of the store. This includes: Fresh fruits and vegetables (produce). Grains, beans, nuts, and seeds. Some of these may be available in unpackaged forms or large amounts (in bulk). Fresh seafood. Poultry and eggs. Low-fat dairy products. Buy whole ingredients instead of prepackaged foods. Buy fresh  fruits and vegetables in-season from local farmers markets. Buy frozen fruits and vegetables in resealable bags. If you do not have access to quality fresh seafood, buy precooked frozen shrimp or canned fish, such as tuna, salmon, or sardines. Buy small amounts of raw or cooked vegetables, salads, or olives from the deli or salad bar at your store. Stock your pantry so you always have certain foods on hand, such as olive oil, canned tuna, canned tomatoes, rice, pasta, and beans. Cooking  Cook foods with extra-virgin olive oil instead of using butter or other vegetable oils. Have meat as a side dish, and have vegetables or grains as your main dish. This means having meat in small portions or adding small amounts of meat to foods like pasta or stew. Use beans or vegetables instead of meat in common dishes like chili or lasagna. Experiment with different cooking methods. Try roasting or broiling vegetables instead of steaming or sauteing them. Add frozen vegetables to soups, stews, pasta, or rice. Add nuts or seeds for added healthy fat at each meal. You can add these to yogurt, salads, or vegetable dishes. Marinate fish or vegetables using olive oil, lemon juice, garlic, and fresh herbs. Meal planning  Plan to eat 1 vegetarian meal one day each week. Try to work up to 2 vegetarian meals, if possible. Eat seafood 2 or more times a week. Have healthy snacks readily available, such as: Vegetable sticks with hummus. Greek yogurt. Fruit and nut trail mix. Eat balanced meals throughout the week. This includes: Fruit: 2-3 servings a day Vegetables: 4-5 servings a day Low-fat dairy: 2 servings a day Fish, poultry, or lean meat: 1 serving a day Beans and legumes: 2 or more servings a week Nuts and seeds: 1-2 servings a day Whole grains: 6-8 servings a day Extra-virgin olive oil: 3-4 servings a day Limit red meat and sweets to only a few servings a month What are my food choices? Mediterranean  diet Recommended Grains: Whole-grain pasta. Brown rice. Bulgar wheat. Polenta. Couscous. Whole-wheat bread. Orpah Cobb. Vegetables: Artichokes. Beets. Broccoli. Cabbage. Carrots. Eggplant. Green beans. Chard. Kale. Spinach. Onions. Leeks. Peas. Squash. Tomatoes. Peppers. Radishes. Fruits: Apples. Apricots. Avocado. Berries. Bananas. Cherries. Dates. Figs. Grapes. Lemons. Melon. Oranges. Peaches. Plums. Pomegranate. Meats and other protein foods: Beans. Almonds. Sunflower seeds. Pine nuts. Peanuts. Cod. Salmon. Scallops. Shrimp. Tuna. Tilapia. Clams. Oysters. Eggs. Dairy: Low-fat milk. Cheese. Greek yogurt. Beverages: Water. Red wine. Herbal tea. Fats and oils: Extra virgin olive oil. Avocado oil. Grape seed oil. Sweets and desserts: [place] yogurt with honey. Baked apples. Poached pears. Trail mix. Seasoning and other foods: Basil. Cilantro. Coriander. Cumin. Mint. Parsley. Sage. Rosemary. Tarragon. Garlic. Oregano. Thyme. Pepper. Balsalmic vinegar. Tahini. Hummus. Tomato sauce. Olives. Mushrooms. Limit these Grains: Prepackaged pasta or rice dishes. Prepackaged cereal with added sugar. Vegetables: Deep fried potatoes (french fries). Fruits: Fruit canned in syrup. Meats and other protein foods: Beef. Pork. Lamb. Poultry with skin. Hot dogs. Tomasa Blase. Dairy: Ice cream. Sour cream. Whole milk. Beverages: Juice. Sugar-sweetened soft drinks. Beer. Liquor and spirits. Fats and oils: Butter. Canola oil. Vegetable oil. Beef fat (tallow).  Lard. Sweets and desserts: Cookies. Cakes. Pies. Candy. Seasoning and other foods: Mayonnaise. Premade sauces and marinades. The items listed may not be a complete list. Talk with your dietitian about what dietary choices are right for you. Summary The Mediterranean diet includes both food and lifestyle choices. Eat a variety of fresh fruits and vegetables, beans, nuts, seeds, and whole grains. Limit the amount of red meat and sweets that you eat. Talk with your  health care provider about whether it is safe for you to drink red wine in moderation. This means 1 glass a day for nonpregnant women and 2 glasses a day for men. A glass of wine equals 5 oz (150 mL). This information is not intended to replace advice given to you by your health care provider. Make sure you discuss any questions you have with your health care provider. Document Released: 01/18/2016 Document Revised: 02/20/2016 Document Reviewed: 01/18/2016 Elsevier Interactive Patient Education  2017 [employer].   [address]

## 2023-06-22 NOTE — Progress Notes (Signed)
 Patient ID: [name], Dwayne    DOB: 21-Jan-1946, 78 y.o.   MRN: [id number]  HPI M never smoker followed for asthma, allergic rhinitis complicated by hx ADHD, CAD/ MI, GERD, anemia  NPSG 10/21/22- AHI 28.7/ hr, desat to 89%/Mean 93%, body weight 165 lbs  --------------------------------------------------------------------------------   03/18/23- 78 yo M never smoker followed for OSA, Asthma, allergic Rhinitis complicated by hx ADHD, CAD/ MI, HTN, GERD, anemia, back pain/spinal stimulator, Vascular Parkinson's,  -Anoro, ProAir , Singulair , zyrtec, Concerta  ER 54, xanax  0.25 mg bid prn,  CPAP auto 5-20/ [employer] Download compliance 67%, AHI 4.5/hr Body weight today-162 lbs Occasional cough and some dyspnea on exertion with hills and stairs.  No dramatic change. Download reviewed.  He does not like his CPAP mask.  We discussed changing. CXR 09/09/22-  No active cardiopulmonary disease. Borderline cardiomegaly.   06/24/23- 78 yo M never smoker followed for OSA, Asthma, allergic Rhinitis complicated by hx ADHD, CAD/ MI, HTN, GERD, anemia, back pain/spinal stimulator, Vascular Parkinson's,  -Anoro, ProAir , Singulair , zyrtec, Concerta  ER 54, xanax  0.25 mg bid prn,  CPAP auto 5-20/ [employer] Download compliance 77%, AHI 3.9/hr Body weight today-166 lbs Discussed the use of AI scribe software for clinical note transcription with the patient, who gave verbal consent to proceed.  History of Present Illness   The patient, with a history of sleep apnea and asthma, reports improved sleep with CPAP use, despite initial discomfort and occasional removal during sleep. He describes feeling groggy upon waking, but overall, he is adjusting to the device. The patient uses a full face mask and has had some issues with the Velcro part of the band, which he has temporarily fixed with tape. He has received a new mask but has not yet used it.  In addition to sleep apnea, the patient also has asthma, which is  reportedly well-controlled with his current inhalers. However, he notes that cold weather causes chest discomfort.  The patient also mentions a tremor, for which he was prescribed levoterazine by his neurologist. He was initially supposed to take it three times a day, but had difficulty remembering the midday dose. The neurologist suggested stopping the medication, but the patient preferred to continue taking it twice a day.     Review of Systems-see HPI + = positive Constitutional:   No-   weight loss, night sweats, fevers, chills, fatigue, lassitude. HEENT:   No-current  headaches, difficulty swallowing, tooth/dental problems, sore throat,       No-  sneezing, itching, ear ache, no-nasal congestion, post nasal drip,  CV:  No-chest pain, No- orthopnea, PND, swelling in lower extremities, anasarca, dizziness, palpitations Resp: +   shortness of breath with exertion or at rest.              No-   productive cough,   +non-productive cough,  No- coughing up of blood.              No-   change in color of mucus.  No- wheezing.   Skin: No-   rash or lesions. GI:  No-   heartburn, indigestion, abdominal pain, nausea, vomiting,  GU:  MS:  No-   joint pain or swelling. Neuro-     nothing unusual Psych:  No- change in mood or affect. No depression or anxiety.  No memory loss.     Objective:   Physical Exam General- Alert, Oriented, Affect-appropriate, Distress- none acute Skin- rash-none, lesions- none, excoriation- none Lymphadenopathy- none Head- atraumatic  Eyes- Gross vision intact, PERRLA, conjunctivae clear secretions            Ears- Hearing, canals-normal            Nose- Clear, no-Septal dev, mucus, polyps, erosion, perforation             Throat- Mallampati II , mucosa clear, drainage- none, tonsils- atrophic. +weak voice Neck- flexible , trachea midline, +no stridor , thyroid  nl, carotid no bruit Chest - symmetrical excursion , unlabored           Heart/CV- RRR , no murmur  , no gallop  , no rub, nl s1 s2                           - JVD- none , edema- none, stasis changes- none, varices- none           Lung-+ clear , good airflow,wheeze- none, cough- none , dullness-none, rub- none           Chest wall- + spinal stimulator Abd- Br/ Gen/ Rectal- Not done, not indicated Extrem- Normal apparent strength and mobility Neuro- grossly intact to observation  Assessment and Plan    Obstructive Sleep Apnea Improved sleep with CPAP use, though inconsistent. Full face mask in use. Apnea-Hypopnea Index (AHI) less than 5, indicating good control. -Continue CPAP use nightly. -Encourage consistent use and address mask issues with home care company.  Asthma Stable, no acute exacerbations. Cold weather exacerbates chest discomfort. -Continue current inhaler regimen.  Tremor On Levoterazine, prescribed by neurologist. Difficulty with midday dose. -Continue Levoterazine twice daily as tolerated.  Follow-up in 6 months or sooner if any issues arise.

## 2023-06-24 ENCOUNTER — Encounter: Payer: Self-pay | Admitting: Internal Medicine

## 2023-06-24 ENCOUNTER — Ambulatory Visit: Payer: Medicare Other | Admitting: Internal Medicine

## 2023-06-24 VITALS — BP 116/62 | HR 99 | Temp 97.9°F | Ht 67.0 in | Wt 166.6 lb

## 2023-06-24 DIAGNOSIS — G4733 Obstructive sleep apnea (adult) (pediatric): Secondary | ICD-10-CM

## 2023-06-24 DIAGNOSIS — J45909 Unspecified asthma, uncomplicated: Secondary | ICD-10-CM

## 2023-06-24 DIAGNOSIS — R251 Tremor, unspecified: Secondary | ICD-10-CM

## 2023-06-24 NOTE — Patient Instructions (Signed)
 Glad you are getting used to your CPAP. We can continue auto 5-20  We can continue also with your asthma meds.  Please call if we can help

## 2023-06-27 ENCOUNTER — Encounter: Payer: Self-pay | Admitting: Medical

## 2023-06-27 ENCOUNTER — Telehealth (INDEPENDENT_AMBULATORY_CARE_PROVIDER_SITE_OTHER): Payer: Medicare Other | Admitting: Medical

## 2023-06-27 VITALS — HR 66 | Ht 67.0 in | Wt 167.0 lb

## 2023-06-27 DIAGNOSIS — J029 Acute pharyngitis, unspecified: Secondary | ICD-10-CM

## 2023-06-27 DIAGNOSIS — R509 Fever, unspecified: Secondary | ICD-10-CM | POA: Diagnosis not present

## 2023-06-27 MED ORDER — AMOXICILLIN 500 MG PO CAPS: 500.0000 mg | ORAL_CAPSULE | Freq: Three times a day (TID) | ORAL | 0 refills | Status: AC

## 2023-06-27 NOTE — Patient Instructions (Signed)
Throat infection, possible strep  Recommendations: Use salt water gargles 2-3 times a day such as 1 teaspoon in a cup of warm water and gargle Use sore throat sprays such as Chloraseptic throat spray He can use Tylenol over-the-counter for pain 2-3 times a day You can use hot tea lemon and honey mixture to soothe the throat Begin antibiotic amoxicillin 3 times a day for 10 days Risk Call or recheck if not much improved within 72 hours

## 2023-06-27 NOTE — Progress Notes (Signed)
Subjective:     Patient ID: [name], male   DOB: [birth year], 78 y.o.   MRN: [id number]  This visit type was conducted due to national recommendations for restrictions regarding the COVID-19 Pandemic (e.g. social distancing) in an effort to limit this patient's exposure and mitigate transmission in our community.  Due to their co-morbid illnesses, this patient is at least at moderate risk for complications without adequate follow up.  This format is felt to be most appropriate for this patient at this time.    Documentation for virtual audio and video telecommunications through [facility] encounter:  The patient was located at home. The provider was located in the office. The patient did consent to this visit and is aware of possible charges through their insurance for this visit.  The other persons participating in this telemedicine service were none. Time spent on call was 20 minutes and in review of previous records 20 minutes total.  This virtual service is not related to other E/M service within previous 7 days.   HPI Chief Complaint  Patient presents with   Sore Throat    4 days. Drainage and pain. Every swallow hurts.   Virtual for sore throat.  He notes symptoms x 4 days ,unrelenting.  Soft palate is very sore.  Hurts to swallow.   No cough, no runny nose, feels some congestion in head.   No chest symptoms.   No NVD.  Felt feverish.  No body aches or chills.  No headache.  No sick contacts.  Using cough drops. Has used mucinex some.  Hasn't used salt water gargles.  No other aggravating or relieving factors. No other complaint.   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 Donnie Aho   Chronic asthma 05/17/2011   Chronic low back pain with sciatica 11/14/2021   Chronic pain 11/21/2016   COPD (chronic obstructive pulmonary disease)    Dizziness 12/24/2021   Early satiety 09/11/2021   Erectile dysfunction    Esophageal motility disorder 09/11/2021   Generalized anxiety disorder    GERD (gastroesophageal reflux disease)    History of benign positional vertigo    History of colonic polyps 09/11/2021   History of kidney stones    Hoarseness 09/11/2021   Hyperlipidemia    Hypertension    Hypogonadism male    prior use on Testim   Insomnia    Iron deficiency 09/11/2021   Left ventricular dysfunction 10/12/2018   Leg pain, left 11/14/2021   Lumbar disc disease    Major depressive disorder    Mild concentric left ventricular hypertrophy (LVH)    Muscle spasm 11/14/2021   Old inferior myocardial infarction 1995   OSA (obstructive sleep apnea) 11/12/2022   NPSG 10/21/22- AHI 28.7/ hr, desat to 89%/Mean 93%, body weight 165 lbs     Pain in both feet 08/10/2021   Peripheral vascular disease 12/25/2021  Pneumonia    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 8.5 each 2   ALPRAZolam (XANAX) 0.25 MG tablet Take 1 tablet (0.25 mg total) by mouth 2 (two) times daily as needed for anxiety. 30  tablet 1   atorvastatin (LIPITOR) 20 MG tablet Take 1 tablet (20 mg total) by mouth daily. 90 tablet 3   azelastine (ASTELIN) 0.1 % nasal spray Place 2 sprays into both nostrils 2 (two) times daily. Use in each nostril as directed 90 mL 3   buPROPion (WELLBUTRIN XL) 300 MG 24 hr tablet Take 1 tablet (300 mg total) by mouth daily. 90 tablet 1   calcium carbonate (OS-CAL) 600 MG TABS tablet Take 600 mg by mouth daily with breakfast.     clopidogrel (PLAVIX) 75 MG tablet Take 1 tablet (75 mg total) by mouth daily. 90 tablet 2   desvenlafaxine (PRISTIQ) 50 MG 24 hr tablet Take 1 tablet (50 mg total) by mouth daily. 90 tablet 3   diltiazem (CARDIZEM CD) 240 MG 24 hr capsule Take 1 capsule (240 mg total) by mouth every other day. 45 capsule 3   ferrous sulfate 325 (65 FE) MG tablet Take 325 mg by mouth daily.     Flaxseed, Linseed, (FLAX SEED OIL) 1000 MG CAPS Take 1,000 mg by mouth every 7 (seven) days.     GLUCOSAMINE CHONDROITIN COMPLX PO Take 1 tablet by mouth daily.     guaiFENesin (MUCINEX) 600 MG 12 hr tablet Take 600 mg by mouth 2 (two) times daily as needed for cough.     meclizine (ANTIVERT) 25 MG tablet TAKE 1 TWICE A DAY AS NEEDED FOR DIZZINESS 180 tablet 0   methylphenidate (CONCERTA) 54 MG PO CR tablet Take 1 tablet (54 mg total) by mouth every morning. 30 tablet 0   Multiple Vitamins-Minerals (MULTIVITAMIN WITH MINERALS) tablet Take 1 tablet by mouth daily.     pantoprazole (PROTONIX) 40 MG tablet Take 1 tablet (40 mg total) by mouth daily. 90 tablet 3   umeclidinium-vilanterol (ANORO ELLIPTA) 62.5-25 MCG/ACT AEPB Inhale 1 puff into the lungs daily at 6 (six) AM. 3 each 3   acetaminophen (TYLENOL) 500 MG tablet Take 1,000 mg by mouth every 6 (six) hours as needed. (Patient not taking: Reported on 06/27/2023)     cetirizine (ZYRTEC) 10 MG tablet Take 10 mg by mouth daily. (Patient not taking: Reported on 06/27/2023)     sucralfate (CARAFATE) 1 GM/10ML suspension Take 10 mLs (1 g total) by  mouth daily. (Patient not taking: Reported on 06/27/2023) 300 mL 3   No current facility-administered medications on file prior to visit.     Review of Systems As in subjective    Objective:   Physical Exam Due to coronavirus pandemic stay at home measures, patient visit was virtual and they were not examined in person.   Pulse 66 Comment: patient reported  Ht 5\' 7"  (1.702 m) Comment: patient reported  Wt 167 lb (75.8 kg) Comment: patient reported  SpO2 98% Comment: patient reported  BMI 26.16 kg/m   Gen: wd, wn,  nad     Assessment:     Encounter Diagnoses  Name Primary?   Sore throat Yes   Fever, unspecified fever cause        Plan:     We discussed symptoms and concerns.  He was unable to report to our office today for swabs.  Given his more severe throat pain persistent for 4 solid days, will treat empirically for possible strep.  We discussed the difference between viral and bacterial sore throat infection.  Advised salt water gargles, warm fluids for comfort and throat, can use Tylenol for fever and pain, sore throat spray over-the-counter to help with throat pain.  Begin antibiotic below.  Rest.  If not much improved within the next 72 hours, then call back   [facility]" was seen today for sore throat.  Diagnoses and all orders for this visit:  Sore throat  Fever, unspecified fever cause  Other orders -     amoxicillin (AMOXIL) 500 MG capsule; Take 1 capsule (500 mg total) by mouth 3 (three) times daily for 10 days.    F/u prn

## 2023-07-03 ENCOUNTER — Ambulatory Visit: Payer: Medicare Other | Admitting: Family Medicine

## 2023-07-03 VITALS — BP 118/74 | HR 100 | Wt 165.6 lb

## 2023-07-03 DIAGNOSIS — G214 Vascular parkinsonism: Secondary | ICD-10-CM | POA: Diagnosis not present

## 2023-07-03 DIAGNOSIS — N3000 Acute cystitis without hematuria: Secondary | ICD-10-CM | POA: Diagnosis not present

## 2023-07-03 LAB — POCT URINALYSIS DIP (PROADVANTAGE DEVICE)
Bilirubin, UA: NEGATIVE
Blood, UA: NEGATIVE
Glucose, UA: NEGATIVE mg/dL
Ketones, POC UA: NEGATIVE mg/dL
Nitrite, UA: NEGATIVE
Protein Ur, POC: 30 mg/dL — AB
Specific Gravity, Urine: 1.01
Urobilinogen, Ur: 0.2
pH, UA: 7 (ref 5.0–8.0)

## 2023-07-03 MED ORDER — SULFAMETHOXAZOLE-TRIMETHOPRIM 800-160 MG PO TABS: 1.0000 | ORAL_TABLET | Freq: Two times a day (BID) | ORAL | 0 refills | Status: DC

## 2023-07-03 NOTE — Progress Notes (Signed)
   Subjective:    Patient ID: [name], male    DOB: [birth year], 78 y.o.   MRN: [id number]  HPI Complains of a 1 week history of urinary frequency, dysuria and urgency.  He apparently has had previous difficulty with this but cannot remember how years ago this was.  At the end of the encounter he then mention episodes of crying over relatively innocuous situations but states that he is better.  Through discussion with him indicates he has also been dealing with Parkinson's type symptoms.  This does happen quite concerned.   Review of Systems     Objective:    Physical Exam Alert and in no distress with appropriate affect.  Urinalysis did show white cells.       Assessment & Plan:  Acute cystitis without hematuria - Plan: POCT Urinalysis DIP (Proadvantage Device), sulfamethoxazole-trimethoprim (BACTRIM DS) 800-160 MG tablet, Urine Culture  Vascular parkinsonism I will treat his infection and explained that we we will need to see him again if he continues to have difficulty and may possibly refer to urology if continued difficulty. I then discussed  Parkinson's with him.  Apparently his neurologist also mention the possibility of counseling.  We will discuss this again at a future encounter however if he continues have difficulty counseling and possible medications might be needed.  His wife is present during all of this.

## 2023-07-04 LAB — URINE CULTURE

## 2023-07-05 ENCOUNTER — Encounter: Payer: Self-pay | Admitting: Family Medicine

## 2023-07-24 ENCOUNTER — Other Ambulatory Visit: Payer: Self-pay | Admitting: Neurology

## 2023-08-05 ENCOUNTER — Encounter: Payer: Self-pay | Admitting: Internal Medicine

## 2023-08-09 ENCOUNTER — Emergency Department (HOSPITAL_BASED_OUTPATIENT_CLINIC_OR_DEPARTMENT_OTHER)
Admission: EM | Admit: 2023-08-09 | Discharge: 2023-08-09 | Disposition: A | Discharge status: Home / Self Care | Attending: Emergency Medicine | Admitting: Emergency Medicine

## 2023-08-09 ENCOUNTER — Other Ambulatory Visit: Payer: Self-pay

## 2023-08-09 ENCOUNTER — Ambulatory Visit
Admission: EM | Admit: 2023-08-09 | Discharge: 2023-08-09 | Disposition: A | Discharge status: Home / Self Care | Attending: Internal Medicine | Admitting: Internal Medicine

## 2023-08-09 ENCOUNTER — Encounter (HOSPITAL_BASED_OUTPATIENT_CLINIC_OR_DEPARTMENT_OTHER): Payer: Self-pay | Admitting: Emergency Medicine

## 2023-08-09 ENCOUNTER — Emergency Department (HOSPITAL_BASED_OUTPATIENT_CLINIC_OR_DEPARTMENT_OTHER)

## 2023-08-09 DIAGNOSIS — Z79899 Other long term (current) drug therapy: Secondary | ICD-10-CM | POA: Diagnosis not present

## 2023-08-09 DIAGNOSIS — R079 Chest pain, unspecified: Secondary | ICD-10-CM | POA: Insufficient documentation

## 2023-08-09 DIAGNOSIS — I10 Essential (primary) hypertension: Secondary | ICD-10-CM | POA: Insufficient documentation

## 2023-08-09 DIAGNOSIS — I2510 Atherosclerotic heart disease of native coronary artery without angina pectoris: Secondary | ICD-10-CM | POA: Diagnosis not present

## 2023-08-09 DIAGNOSIS — J449 Chronic obstructive pulmonary disease, unspecified: Secondary | ICD-10-CM | POA: Diagnosis not present

## 2023-08-09 LAB — CBC WITH DIFFERENTIAL/PLATELET
Abs Immature Granulocytes: 0.02 10*3/uL (ref 0.00–0.07)
Basophils Absolute: 0 10*3/uL (ref 0.0–0.1)
Basophils Relative: 1 %
Eosinophils Absolute: 0.2 10*3/uL (ref 0.0–0.5)
Eosinophils Relative: 3 %
HCT: 37.8 % — ABNORMAL LOW (ref 39.0–52.0)
Hemoglobin: 12 g/dL — ABNORMAL LOW (ref 13.0–17.0)
Immature Granulocytes: 0 %
Lymphocytes Relative: 26 %
Lymphs Abs: 1.8 10*3/uL (ref 0.7–4.0)
MCH: 27.8 pg (ref 26.0–34.0)
MCHC: 31.7 g/dL (ref 30.0–36.0)
MCV: 87.7 fL (ref 80.0–100.0)
Monocytes Absolute: 0.5 10*3/uL (ref 0.1–1.0)
Monocytes Relative: 8 %
Neutro Abs: 4.2 10*3/uL (ref 1.7–7.7)
Neutrophils Relative %: 62 %
Platelets: 264 10*3/uL (ref 150–400)
RBC: 4.31 MIL/uL (ref 4.22–5.81)
RDW: 14.8 % (ref 11.5–15.5)
WBC: 6.7 10*3/uL (ref 4.0–10.5)
nRBC: 0 % (ref 0.0–0.2)

## 2023-08-09 LAB — BASIC METABOLIC PANEL
Anion gap: 6 (ref 5–15)
BUN: 15 mg/dL (ref 8–23)
CO2: 23 mmol/L (ref 22–32)
Calcium: 8.9 mg/dL (ref 8.9–10.3)
Chloride: 106 mmol/L (ref 98–111)
Creatinine, Ser: 0.93 mg/dL (ref 0.61–1.24)
GFR, Estimated: >60 mL/min (ref >60)
Glucose, Bld: 94 mg/dL (ref 70–99)
Potassium: 3.7 mmol/L (ref 3.5–5.1)
Sodium: 135 mmol/L (ref 135–145)

## 2023-08-09 LAB — TROPONIN I (HIGH SENSITIVITY): Troponin I (High Sensitivity): 3 ng/L (ref <18)

## 2023-08-09 NOTE — ED Provider Notes (Signed)
 [facility] Provider Note   CSN: [id number] Arrival date & time: 08/09/23  1249     History  Chief Complaint  Patient presents with   Chest Pain    [name] is a 78 y.o. male with past medical history of coronary artery disease, hyperlipidemia, hypertension, COPD, GERD presenting to emergency room with complaint of single episode of chest pain around 6 AM.  Patient reports that the chest pain lasted approximately a few seconds.  Patient reports once he repositioned himself in bed his chest pain resolved. Reports because of the chest pain he had difficulty taking deep breath then. He is currently chest pain-free and has been for also 9 hours at this point. He is requesting to go home because "he feels fine." Dose admit to years of exertional shortness of breath, he reports this occurs with walking up hills and better with rest. No chest pain with this. Reports this has been happening since 1995 with no change. Did not have any palpitations dizziness lightheadedness or diaphoresis when this occurred. Denies chest pain or shortness of breath with exertion. No flu like symptoms.    Chest Pain      Home Medications Prior to Admission medications   Medication Sig Start Date End Date Taking? Authorizing Provider  acetaminophen (TYLENOL) 500 MG tablet Take 1,000 mg by mouth every 6 (six) hours as needed.    [provider]  albuterol (PROAIR HFA) 108 (90 Base) MCG/ACT inhaler Inhale 2 puffs into the lungs every 6 (six) hours as needed for wheezing or shortness of breath. 01/01/23   Ronnald Nian, MD  ALPRAZolam Prudy Feeler) 0.25 MG tablet Take 1 tablet (0.25 mg total) by mouth 2 (two) times daily as needed for anxiety. 03/25/23   Ronnald Nian, MD  atorvastatin (LIPITOR) 20 MG tablet Take 1 tablet (20 mg total) by mouth daily. 01/01/23 01/01/24  Ronnald Nian, MD  azelastine (ASTELIN) 0.1 % nasal spray Place 2 sprays into both  nostrils 2 (two) times daily. Use in each nostril as directed 01/01/23   Ronnald Nian, MD  buPROPion (WELLBUTRIN XL) 300 MG 24 hr tablet Take 1 tablet (300 mg total) by mouth daily. 01/01/23   Ronnald Nian, MD  calcium carbonate (OS-CAL) 600 MG TABS tablet Take 600 mg by mouth daily with breakfast.    [provider]  cetirizine (ZYRTEC) 10 MG tablet Take 10 mg by mouth daily.    [provider]  clopidogrel (PLAVIX) 75 MG tablet Take 1 tablet (75 mg total) by mouth daily. 04/22/23   Baldo Daub, MD  desvenlafaxine (PRISTIQ) 50 MG 24 hr tablet Take 1 tablet (50 mg total) by mouth daily. 01/01/23   Ronnald Nian, MD  diltiazem (CARDIZEM CD) 240 MG 24 hr capsule Take 1 capsule (240 mg total) by mouth every other day. 02/27/23   Baldo Daub, MD  ferrous sulfate 325 (65 FE) MG tablet Take 325 mg by mouth daily.    [provider]  Flaxseed, Linseed, (FLAX SEED OIL) 1000 MG CAPS Take 1,000 mg by mouth every 7 (seven) days.    [provider]  GLUCOSAMINE CHONDROITIN COMPLX PO Take 1 tablet by mouth daily.    [provider]  guaiFENesin (MUCINEX) 600 MG 12 hr tablet Take 600 mg by mouth 2 (two) times daily as needed for cough.    [provider]  meclizine (ANTIVERT) 25 MG tablet TAKE 1 TWICE  A DAY AS NEEDED FOR DIZZINESS 04/25/23   Ronnald Nian, MD  methylphenidate (CONCERTA) 54 MG PO CR tablet Take 1 tablet (54 mg total) by mouth every morning. 05/20/23   Ronnald Nian, MD  Multiple Vitamins-Minerals (MULTIVITAMIN WITH MINERALS) tablet Take 1 tablet by mouth daily.    [provider]  pantoprazole (PROTONIX) 40 MG tablet Take 1 tablet (40 mg total) by mouth daily. 01/01/23   Ronnald Nian, MD  sucralfate (CARAFATE) 1 GM/10ML suspension Take 10 mLs (1 g total) by mouth daily. 06/21/20   Baldo Daub, MD  sulfamethoxazole-trimethoprim (BACTRIM DS) 800-160 MG tablet Take 1 tablet by mouth 2 (two) times daily. 07/03/23    Ronnald Nian, MD  umeclidinium-vilanterol [facility] ELLIPTA) 62.5-25 MCG/ACT AEPB Inhale 1 puff into the lungs daily at 6 (six) AM. 01/01/23   Ronnald Nian, MD      Allergies    Oxycodone    Review of Systems   Review of Systems  Cardiovascular:  Positive for chest pain.    Physical Exam Updated Vital Signs BP 133/87   Pulse 79   Temp (!) 97.3 F (36.3 C)   Resp 13   Ht 5\' 7"  (1.702 m)   Wt 75.1 kg   SpO2 100%   BMI 25.93 kg/m  Physical Exam Vitals and nursing note reviewed.  Constitutional:      General: He is not in acute distress.    Appearance: He is not toxic-appearing.  HENT:     Head: Normocephalic and atraumatic.  Eyes:     General: No scleral icterus.    Conjunctiva/sclera: Conjunctivae normal.  Cardiovascular:     Rate and Rhythm: Normal rate and regular rhythm.     Pulses: Normal pulses.     Heart sounds: Normal heart sounds.  Pulmonary:     Effort: Pulmonary effort is normal. No respiratory distress.     Breath sounds: Normal breath sounds.  Abdominal:     General: Abdomen is flat. Bowel sounds are normal.     Palpations: Abdomen is soft.     Tenderness: There is no abdominal tenderness.  Musculoskeletal:     Right lower leg: No edema.     Left lower leg: No edema.  Skin:    General: Skin is warm and dry.     Findings: No lesion.  Neurological:     General: No focal deficit present.     Mental Status: He is alert and oriented to person, place, and time. Mental status is at baseline.     ED Results / Procedures / Treatments   Labs (all labs ordered are listed, but only abnormal results are displayed) Labs Reviewed  CBC WITH DIFFERENTIAL/PLATELET - Abnormal; Notable for the following components:      Result Value   Hemoglobin 12.0 (*)    HCT 37.8 (*)    All other components within normal limits  BASIC METABOLIC PANEL  TROPONIN I (HIGH SENSITIVITY)    EKG None  Radiology DG Chest 2 View Result Date: 08/09/2023 CLINICAL DATA:  Chest  pain EXAM: CHEST - 2 VIEW COMPARISON:  09/09/2022 FINDINGS: Cardiomediastinal silhouette and pulmonary vasculature are within normal limits. Lungs are clear. Neurostimulator leads seen terminating in the midthoracic spine. Lumbar fusion hardware partially visualized. IMPRESSION: No acute cardiopulmonary process. Electronically Signed   By: Acquanetta Belling M.D.   On: 08/09/2023 14:08    Procedures Procedures    Medications Ordered in ED Medications - No data to display  ED Course/ Medical Decision Making/ A&P                                 Medical Decision Making Amount and/or Complexity of Data Reviewed Labs: ordered. Radiology: ordered.   [name] 78 y.o. presented today for chest pain. Working DDx that I considered at this time includes, but not limited to, ACS, GERD, pe, pna, aortic dissection, pneumothorax, MSK path, anemia, esophageal rupture, CHF exacerbation, valvular disorder, myocarditis, pericarditis, endocarditis, pericardial effusion/cardiac tamponade, pulmonary edema, gastritis/PUD, esophagitis.   Review of prior external notes: 08/09/23 UC visit   Unique Tests and My Interpretation:  EKG: Rate, rhythm, axis, intervals all examined: sinus   Troponin: 3 CXR: no acute findings  CBC: no leukocytosis, no anemia  BMP: unremarkable   Problem List / ED Course / Critical interventions / Medication management  Patient woke up and started experiencing central chest pain that lasted several seconds.  It was nonradiating and resolved after repositioning himself.  Not associated with shortness of breath, palpitations generalized weakness or other symptoms. Does sound like atypical chest pain. He has now been symptom-free for over 8 hours.  This does not feel like his prior cardiac events.  His EKG shows sinus rhythm and troponin 3 thus doubt ACS as cause of pain.  Chest x-ray shows no pneumonia or pneumothorax.  He has no signs of fluid overload on exam thus doubt CHF.  He  has no sign of DVT on exam thus doubt PE as cause of chest pain.  Pain is not consistent with dissection.  He has been hemodynamically stable throughout stay here workup reassuring vital stable is a stable for discharge.  He is established with cardiology and will follow-up. He would like to be discharged.   Patients vitals assessed. Upon arrival patient is hemodynamically stable.  I have reviewed the patients home medicines and have made adjustments as needed    Plan:  F/u w/ PCP to ensure resolution of sx.  Patient was given return precautions. Patient stable for discharge at this time.  Patient educated on sx/ dx and verbalized understanding of plan.  Will return to ER w/ new or worsening sx.          Final Clinical Impression(s) / ED Diagnoses Final diagnoses:  Chest pain, unspecified type    Rx / DC Orders ED Discharge Orders     None         Smitty Knudsen, PA-C 08/09/23 1657    Sloan Leiter, DO 08/10/23 2246

## 2023-08-09 NOTE — ED Notes (Signed)
 Patient is being discharged from the Urgent Care and sent to the Emergency Department via POV . Per Reita May, FNP, patient is in need of higher level of care due to chest pain. Patient is aware and verbalizes understanding of plan of care.  Vitals:   08/09/23 1138  BP: 116/73  Pulse: 78  Resp: 18  Temp: 97.7 F (36.5 C)  SpO2: 96%

## 2023-08-09 NOTE — Discharge Instructions (Addendum)
 Please follow up with cardiology since you experienced chest pain.  Your chest pain is atypical.  Your chest x-ray is normal.  Your EKG is normal.  Your troponin which is a heart enzyme is normal.  It is still important to follow-up with your heart doctor to ensure you do not need any further workup.  Please return to emergency room if you start experiencing chest pain or shortness of breath with exertion.

## 2023-08-09 NOTE — ED Provider Notes (Signed)
 Patient presents to urgent care for evaluation of episodic chest pain and shortness of breath that started this morning upon waking.  He states he has felt weak this morning and had an episode of central chest discomfort that he states was dull in nature and an 8 on a scale of 0-10.  Chest pain improved significantly when he sat up and has not returned since. History of CAD with MI in 1995.  Last heart catheterization was in 2006.  Denies nausea, vomiting, diarrhea, cough, fever, chills, and shortness of breath.  Chest pain is not reproducible on exam.  EKG shows normal sinus rhythm with T wave inversions in the inferior leads which is similar to most recent EKG on file in September 2024.  No new ST or T wave changes.  Given history and risk factors for acute coronary syndrome, recommend further workup in the emergency room with troponins.  Discussed risks of deferring ER visit with patient and his wife who both expressed understanding and agreement with plan to proceed to the nearest emergency room via private car.   Carlisle Beers, [place] 08/09/23 1240

## 2023-08-09 NOTE — ED Triage Notes (Signed)
 Pt c/o CP episode around 0600; denies pain now; was seen at [facility] earlier; reports [facility] with exertion "for some time", NAD at this time

## 2023-08-09 NOTE — ED Triage Notes (Signed)
 Pt c/o chest chest pain and weakness that began this morning while his was laying on side. Chest pain got better when he sat up.

## 2023-08-11 ENCOUNTER — Ambulatory Visit: Payer: Self-pay

## 2023-08-11 ENCOUNTER — Institutional Professional Consult (permissible substitution): Payer: Medicare Other | Admitting: Psychology

## 2023-08-18 ENCOUNTER — Encounter: Payer: Medicare Other | Admitting: Psychology

## 2023-08-19 ENCOUNTER — Telehealth: Payer: Self-pay | Admitting: Occupational Therapy

## 2023-08-19 ENCOUNTER — Ambulatory Visit: Payer: Medicare Other | Admitting: Physical Therapy

## 2023-08-19 ENCOUNTER — Ambulatory Visit: Payer: Medicare Other | Attending: Neurology

## 2023-08-19 ENCOUNTER — Ambulatory Visit: Payer: Medicare Other | Admitting: Occupational Therapy

## 2023-08-19 ENCOUNTER — Other Ambulatory Visit: Payer: Self-pay

## 2023-08-19 DIAGNOSIS — R278 Other lack of coordination: Secondary | ICD-10-CM

## 2023-08-19 DIAGNOSIS — R490 Dysphonia: Secondary | ICD-10-CM

## 2023-08-19 DIAGNOSIS — R293 Abnormal posture: Secondary | ICD-10-CM

## 2023-08-19 DIAGNOSIS — R2681 Unsteadiness on feet: Secondary | ICD-10-CM

## 2023-08-19 DIAGNOSIS — R41841 Cognitive communication deficit: Secondary | ICD-10-CM | POA: Insufficient documentation

## 2023-08-19 DIAGNOSIS — R41842 Visuospatial deficit: Secondary | ICD-10-CM | POA: Insufficient documentation

## 2023-08-19 DIAGNOSIS — R2689 Other abnormalities of gait and mobility: Secondary | ICD-10-CM | POA: Insufficient documentation

## 2023-08-19 DIAGNOSIS — M6281 Muscle weakness (generalized): Secondary | ICD-10-CM

## 2023-08-19 DIAGNOSIS — G214 Vascular parkinsonism: Secondary | ICD-10-CM

## 2023-08-19 DIAGNOSIS — R471 Dysarthria and anarthria: Secondary | ICD-10-CM | POA: Insufficient documentation

## 2023-08-19 DIAGNOSIS — R413 Other amnesia: Secondary | ICD-10-CM

## 2023-08-19 DIAGNOSIS — R41840 Attention and concentration deficit: Secondary | ICD-10-CM | POA: Insufficient documentation

## 2023-08-19 NOTE — Therapy (Signed)
 [facility] [address] [place], [place], [address] Phone: [phone]   Fax:  [phone]  Patient Details  Name: [name] MRN: [id number] Date of Birth: [birth year] Referring Provider:  Kerin Salen DO  Encounter Date: 08/19/2023  Speech Therapy Parkinson's Disease Screen   Decibel Level today: 65 dB  (WNL=70-72 dB) with sound level meter 30cm away from pt's mouth. Pt's conversational volume has declined since last treatment course. Reported increased word finding difficulty (~2x/day) and some reduced recall warranting further evaluation.   Pt does not report difficulty with swallowing, which does not warrant further evaluation  Pt would benefit from speech-language eval for dysarthria and cognition - please order via EPIC or call [phone] to schedule   Gracy Racer, CCC-SLP 08/19/2023, 10:09 AM  [facility] [address] [place], [place], [address] Phone: [phone]   Fax:  [phone]

## 2023-08-19 NOTE — Therapy (Signed)
 [facility] [address] [place], [place], [address] Phone: [phone]   Fax:  [phone]  Patient Details  Name: [name] MRN: [id number] Date of Birth: [birth year] Referring Provider:  Ronnald Nian, MD  Encounter Date: 08/19/2023   PT End of Session - 08/19/23 1014     Visit Number 1    PT Start Time 1018    PT Stop Time 1029    PT Time Calculation (min) 11 min    Activity Tolerance Patient tolerated treatment well    Behavior During Therapy [facility] for tasks assessed/performed              Physical Therapy Parkinson's Disease Screen   Timed Up and Go test:19.47s w/RW (previously 11s w/RW)   10 meter walk test:16.56s = 1.98 ft/s w/RW  (previously 1.87 ft/s w/RW)   5 time sit to stand test:21.31s (previously 14.1s)   Patient would benefit from Physical Therapy evaluation due to recent falls (one in [facility]) and slowed mobility.     Jill Alexanders Chelsy Parrales, PT, DPT 08/19/2023, 10:15 AM  [facility] [address] [place], [place], [address] Phone: [phone]   Fax:  [phone]

## 2023-08-19 NOTE — Telephone Encounter (Signed)
 Dr. Arbutus Leas,  This pt was screened by OT, PT, and ST today.  The patient would benefit from evaluation for all 3 disciplines.    If you agree, please place an order in [facility] workque in [facility] or fax the order to [phone]. Thank you,  Shelbie Proctor, OTR/L 08/19/2023 Occupational Therapy   [facility] [address] [place], [place]  [address] Phone:  [phone] Fax:  [phone]

## 2023-08-19 NOTE — Therapy (Signed)
 Occupational Therapy Parkinson's Disease Screen  Hand dominance:  Right   Physical Performance Test item #2 (simulated eating):  14 sec  Physical Performance Test item #4 (donning/doffing jacket):  22 sec  Fastening/unfastening 3 buttons in:  50 sec  9-hole peg test:    RUE  39 sec        LUE  40 sec  Box & Blocks Test:   RUE  37 blocks        LUE  45 blocks  Change in ability to perform ADLs/IADLs:  Pt's wife reports he requires more assistance with dressing.   Pt would benefit from occupational therapy evaluation to address functional deficits and establish HEP secondary to PD diagnosis.

## 2023-08-27 NOTE — Therapy (Signed)
 OUTPATIENT OCCUPATIONAL THERAPY PARKINSON'S EVALUATION  Patient Name: [name] MRN: [id number] DOB:1946/05/18, 78 y.o., male Today's Date: 09/01/2023  PCP: Ronnald Nian, MD REFERRING PROVIDER: Vladimir Faster, DO  END OF SESSION:  OT End of Session - 09/01/23 1513     Visit Number 1    Number of Visits 16    Date for OT Re-Evaluation 11/01/23    Authorization Type U[employer] MCR - Auth required    Progress Note Due on Visit 10    OT Start Time 0930    OT Stop Time 1010    OT Time Calculation (min) 40 min    Activity Tolerance Patient tolerated treatment well    Behavior During Therapy [facility] for tasks assessed/performed             Past Medical History:  Diagnosis Date   Acquired hallux rigidus, right 03/25/2023   Acquired trigger finger of left middle finger 04/22/2019   Acquired trigger finger of right middle finger 04/22/2019   ADHD, predominantly hyperactive type    Allergic rhinitis due to pollen 08/08/2010   Allergy vaccine 1:50,000 03/25/08; 1:10 03/27/09 > 1:2 GH >> DC'd 01/04/2015 for observation        Anemia    takes Fe -    Anginal pain    Ankylosing spondylitis 10/09/2018   Arthritis    Atherosclerosis of aorta 08/30/2020   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 Donnie Aho   Chronic asthma 05/17/2011   Chronic low back pain with sciatica 11/14/2021   Chronic pain 11/21/2016   COPD (chronic obstructive pulmonary disease)    Dizziness 12/24/2021   Early satiety 09/11/2021   Erectile dysfunction    Esophageal motility disorder 09/11/2021   Generalized anxiety disorder    GERD (gastroesophageal reflux disease)    History of benign positional vertigo    History of colonic polyps 09/11/2021   History of kidney stones    Hoarseness 09/11/2021   Hyperlipidemia    Hypertension    Hypogonadism male    prior use on Testim   Insomnia    Iron deficiency 09/11/2021   Left ventricular dysfunction 10/12/2018   Leg pain, left  11/14/2021   Lumbar disc disease    Major depressive disorder    Mild concentric left ventricular hypertrophy (LVH)    Muscle spasm 11/14/2021   Old inferior myocardial infarction 1995   OSA (obstructive sleep apnea) 11/12/2022   NPSG 10/21/22- AHI 28.7/ hr, desat to 89%/Mean 93%, body weight 165 lbs     Pain in both feet 08/10/2021   Peripheral vascular disease 12/25/2021   Pneumonia    Presbycusis of both ears 04/23/2018   Shortness of breath dyspnea    Slow transit constipation 09/11/2021   Spinal cord stimulator status 11/14/2021   Spondylolisthesis of lumbar region 02/28/2015   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 Donnie Aho   CHEILECTOMY  10/16/2011   Procedure: CHEILECTOMY;  Surgeon: Sherri Rad, MD;  Location: [facility];  Service: Orthopedics;  Laterality: Right;   CHOLECYSTECTOMY     COLONOSCOPY     CORONARY ANGIOPLASTY     1995 after MI   CORONARY ANGIOPLASTY WITH  STENT PLACEMENT     1999    ESOPHAGOGASTRODUODENOSCOPY  02/18/2012   Procedure: ESOPHAGOGASTRODUODENOSCOPY (EGD);  Surgeon: Florencia Reasons, MD;  Location: [facility];  Service: Endoscopy;  Laterality: N/A;   FOOT SURGERY     joint scraped, arthritis right foot   HERNIA REPAIR     LUMBAR DISC SURGERY     x2   right index finger  mass removed- arthritis     Dr. Amanda Pea   [facility]  Right 05/18/2021   medial toe consistent with callus   SKIN BIOPSY Left 05/17/2021   actinic keratosis   SPINAL CORD STIMULATOR BATTERY EXCHANGE N/A 10/11/2021   Procedure: SPINAL CORD STIMULATOR BATTERY EXCHANGE;  Surgeon: Venita Lick, MD;  Location: [facility];  Service: Orthopedics;  Laterality: N/A;  60 mins   SPINAL CORD STIMULATOR INSERTION N/A  11/21/2016   Procedure: LUMBAR SPINAL CORD STIMULATOR INSERTION;  Surgeon: Venita Lick, MD;  Location: [facility];  Service: Orthopedics;  Laterality: N/A;  Requests 2.5 hrs   TONSILLECTOMY     1953   UPPER GI ENDOSCOPY  [id number]   VASECTOMY     1980   Patient Active Problem List   Diagnosis Date Noted   Attention or concentration deficit [R41.840] 05/30/2023   MCI (mild cognitive impairment) 05/30/2023   Generalized anxiety disorder    Acquired hallux rigidus, right 03/25/2023   OSA (obstructive sleep apnea) 11/12/2022   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   Peripheral vascular disease 12/25/2021   Dizziness 12/24/2021   Leg pain, left 11/14/2021   Chronic low back pain with sciatica 11/14/2021   Spinal cord stimulator status 11/14/2021   Muscle spasm 11/14/2021   Slow transit constipation 09/11/2021   Early satiety 09/11/2021   Hoarseness 09/11/2021   Iron deficiency 09/11/2021   History of colonic polyps 09/11/2021   Weight decreased 09/11/2021   Esophageal motility disorder 09/11/2021   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   Pain in both feet 08/10/2021   Atherosclerosis of aorta 08/30/2020   Mild concentric left ventricular hypertrophy (LVH)    Hypogonadism male    History of kidney stones    Erectile dysfunction    COPD (chronic obstructive pulmonary disease)    Acquired trigger finger of left middle finger 04/22/2019   Acquired trigger finger of right middle finger 04/22/2019   Left ventricular dysfunction 10/12/2018   Ankylosing spondylitis 10/09/2018   Presbycusis of both ears 04/23/2018   Chronic pain 11/21/2016   Arthritis 10/15/2016   Spondylolisthesis of lumbar region 02/28/2015   Hyperlipidemia    Lumbar disc disease    Hypertension    CAD (coronary artery disease) 05/19/2012   GERD (gastroesophageal reflux disease)    Major depressive disorder    Chronic asthma 05/17/2011   Allergic rhinitis due to pollen 08/08/2010   History of benign positional vertigo    ADHD,  predominantly hyperactive type     ONSET DATE: 08/19/2023 (referral date - based off screen recommendations)  REFERRING DIAG: Parkinson's Disease R41.3 (ICD-10-CM) - Memory impairment  THERAPY DIAG:  Other lack of coordination  Unsteadiness on feet  Abnormal posture  Muscle weakness (generalized)  Attention and concentration deficit  Visuospatial deficit  Rationale for Evaluation and Treatment: Rehabilitation  SUBJECTIVE:   SUBJECTIVE STATEMENT: I get distracted easily. I have had dizziness for years Pt accompanied by: significant other  PERTINENT HISTORY: PD (diagnosed Spring 2024), ADHD, CAD, COPD, HTN, HLD, Dizziness, MI  PRECAUTIONS: Fall and Other: no driving, spinal nerve stimulator  WEIGHT BEARING RESTRICTIONS: No  PAIN:  Are  you having pain? No  FALLS: Has patient fallen in last 6 months? No  LIVING ENVIRONMENT: Lives with: lives with their spouse Gigi Gin") and 3 cats Lives in:1 story home, 3-4 steps to enter (ramp on back). 4 steps down to washer/dryer but wife does Has following equipment at home: Dan Humphreys - 2 wheeled  PLOF: Independent with basic ADLs, retired  PATIENT GOALS: get better  OBJECTIVE:  Note: Objective measures were completed at Evaluation unless otherwise noted.  HAND DOMINANCE: Right  ADLs: Overall ADLs: slower Transfers/ambulation related to ADLs: no device in house Eating: independent Grooming: independent UB Dressing: slower with jacket, difficulty and occasional assist with buttons LB Dressing: slower, assist for suspenders Toileting: independent Bathing: mod I - pt has grab bars Tub Shower transfers: walk in shower, but currently using tub/shower combo Equipment: Grab bars  IADLs: Shopping: pt accompanies wife occasionally Light housekeeping: pt washes dishes Meal Prep: pt cooks some [employer] mobility: relies on wife for transportation. Has not driven in a year since diagnosis Medication management: pt/wife  loads pillbox together, pt sometimes forgets to take meds Financial management: wife does Handwriting: 100% legible and Moderate micrographia  MOBILITY STATUS:  uses walker outside of home, mostly independent in home  POSTURE COMMENTS:  rounded shoulders and forward head   FUNCTIONAL OUTCOME MEASURES: (from screen on 08/19/23)  Fastening/unfastening 3 buttons: 50 sec Physical performance test: PPT#2 (simulated eating) 14 sec & PPT#4 (donning/doffing jacket): 22 sec  COORDINATION: (from screen on 08/19/23)  9 Hole Peg test: Right: 39 sec; Left: 40 sec Box and Blocks:  Right 37 blocks, Left 45 blocks Tremors: Resting and mild  UE ROM:  WFL   SENSATION: WFL  MUSCLE TONE: not tested  COGNITION: Overall cognitive status: Impaired and decreased memory . Pt also has ADHD (premorbid) and reports he's easily distracted.   OBSERVATIONS: Bradykinesia and Hypokinesia, stooped posture, shuffling feet, arthritis bilateral hands w/ MP ulnar drift                                                                                                                    TREATMENT DATE: 09/01/23  Reviewed with pt/wife O.T. findings, POC, goals  Also recommended shower chair for safety/fall prevention when washing lower legs/feet and when closing eyes to wash/rinse face and hair    PATIENT EDUCATION: Education details: see above Person educated: Patient and Spouse Education method: Explanation Education comprehension: verbalized understanding  HOME EXERCISE PROGRAM: N/A  GOALS: Goals reviewed with patient? Yes  SHORT TERM GOALS: Target date: 10/02/23  Independent with PD specific HEP (including finger walking ex to counteract MP ulnar drift)    Baseline: Goal status: INITIAL  2.  Pt will verbalize understanding of ways to keep thinking skills sharp and ways to compensate for STM changes for medication management Baseline:  Goal status: INITIAL  3.  Pt will write a sentence with no  significant decrease in size and maintain 100% legibility  Baseline:  Goal status: INITIAL  4.  Pt will demonstrate  increased ease with dressing as evidenced by decreasing PPT#4 (don/ doff jacket) to 18 secs or less  Baseline: 22 sec Goal status: INITIAL  5.  Pt will verbalize understanding of adaptive strategies to increase ease with ADLS/IADLS   Baseline:  Goal status: INITIAL  6.  Pt to improve RUE as evidenced by increasing Box & Blocks by 4 or more Baseline: 37 Goal status: INITIAL  LONG TERM GOALS: Target date: 11/01/23  Pt will verbalize understanding of ways to prevent future PD related complications and appropriate community resources prn  Baseline:  Goal status: INITIAL  2.  Pt will demonstrate improved ease with fastening buttons as evidenced by decreasing 3 button/unbutton time by 8 seconds or more Baseline: 50 sec Goal status: INITIAL  3.  Pt will demonstrate improved fine motor coordination for ADLs as evidenced by decreasing 9 hole peg test score for each hand and by 5 secs  Baseline: Rt = 39 sec, Lt = 40 sec.  Goal status: INITIAL  4.  Pt will write a paragraph with no significant decrease in size and maintain 100% legibility  Baseline:  Goal status: INITIAL  5.  Pt will consistently don suspenders I'ly Baseline: needs assist Goal status: INITIAL  ASSESSMENT:  CLINICAL IMPRESSION: Patient is a 78 y.o. male who was seen today for occupational therapy evaluation for PD. Patient currently presents below baseline level of functioning demonstrating functional deficits and impairments as noted below. Pt would benefit from skilled OT services in the outpatient setting to work on impairments as noted below, develop PD specific HEP, and prevent future related PD complications. [name]   PERFORMANCE DEFICITS: in functional skills including ADLs, IADLs, coordination, dexterity, proprioception, strength, Fine motor control, Gross motor control, mobility, balance, body  mechanics, decreased knowledge of precautions, decreased knowledge of use of DME, vision, and UE functional use, cognitive skills including attention, memory, problem solving, and safety awareness, and psychosocial skills including coping strategies.   IMPAIRMENTS: are limiting patient from ADLs, IADLs, leisure, and social participation.   COMORBIDITIES:  may have co-morbidities  that affects occupational performance. Patient will benefit from skilled OT to address above impairments and improve overall function.  MODIFICATION OR ASSISTANCE TO COMPLETE EVALUATION: No modification of tasks or assist necessary to complete an evaluation.  OT OCCUPATIONAL PROFILE AND HISTORY: Detailed assessment: Review of records and additional review of physical, cognitive, psychosocial history related to current functional performance.  CLINICAL DECISION MAKING: Moderate - several treatment options, min-mod task modification necessary  REHAB POTENTIAL: Good  EVALUATION COMPLEXITY: Moderate    PLAN:  OT FREQUENCY: 2x/week  OT DURATION: 8 weeks  PLANNED INTERVENTIONS: 97535 self care/ADL training, 16109 therapeutic exercise, 97530 therapeutic activity, 97112 neuromuscular re-education, 97140 manual therapy, 97018 paraffin, 60454 fluidotherapy, 97010 moist heat, 97034 contrast bath, 97760 Orthotics management and training, 09811 Splinting (initial encounter), [id number] Subsequent splinting/medication, passive range of motion, functional mobility training, visual/perceptual remediation/compensation, energy conservation, coping strategies training, patient/family education, and DME and/or AE instructions  RECOMMENDED OTHER SERVICES: none at this time  CONSULTED AND AGREED WITH PLAN OF CARE: Patient and family member/caregiver  PLAN FOR NEXT SESSION: PWR! Hands, coordination HEP, finger walking exercise to radial side   Sheran Lawless, OT 09/01/2023, 3:14 PM

## 2023-09-01 ENCOUNTER — Ambulatory Visit: Admitting: Speech Pathology

## 2023-09-01 ENCOUNTER — Encounter: Payer: Self-pay | Admitting: Speech Pathology

## 2023-09-01 ENCOUNTER — Ambulatory Visit: Admitting: Occupational Therapy

## 2023-09-01 ENCOUNTER — Encounter: Payer: Self-pay | Admitting: Occupational Therapy

## 2023-09-01 ENCOUNTER — Ambulatory Visit: Admitting: Physical Therapy

## 2023-09-01 DIAGNOSIS — M6281 Muscle weakness (generalized): Secondary | ICD-10-CM

## 2023-09-01 DIAGNOSIS — R2681 Unsteadiness on feet: Secondary | ICD-10-CM

## 2023-09-01 DIAGNOSIS — R41842 Visuospatial deficit: Secondary | ICD-10-CM | POA: Diagnosis present

## 2023-09-01 DIAGNOSIS — R2689 Other abnormalities of gait and mobility: Secondary | ICD-10-CM

## 2023-09-01 DIAGNOSIS — R41841 Cognitive communication deficit: Secondary | ICD-10-CM | POA: Diagnosis present

## 2023-09-01 DIAGNOSIS — R278 Other lack of coordination: Secondary | ICD-10-CM

## 2023-09-01 DIAGNOSIS — R293 Abnormal posture: Secondary | ICD-10-CM

## 2023-09-01 DIAGNOSIS — R471 Dysarthria and anarthria: Secondary | ICD-10-CM

## 2023-09-01 DIAGNOSIS — R41840 Attention and concentration deficit: Secondary | ICD-10-CM | POA: Diagnosis present

## 2023-09-01 NOTE — Therapy (Signed)
 OUTPATIENT PHYSICAL THERAPY NEURO EVALUATION   Patient Name: [name] MRN: [id number] DOB:12/22/1945, 78 y.o., male Today's Date: 09/01/2023   PCP: Ronnald Nian, MD   REFERRING PROVIDER: Vladimir Faster, DO   END OF SESSION:  PT End of Session - 09/01/23 1016     Visit Number 1    Number of Visits 7    Date for PT Re-Evaluation 10/31/23    Authorization Type [employer] Medicare    PT Start Time 1015    PT Stop Time 1057    PT Time Calculation (min) 42 min    Equipment Utilized During Treatment Gait belt    Activity Tolerance Patient tolerated treatment well    Behavior During Therapy WFL for tasks assessed/performed             Past Medical History:  Diagnosis Date   Acquired hallux rigidus, right 03/25/2023   Acquired trigger finger of left middle finger 04/22/2019   Acquired trigger finger of right middle finger 04/22/2019   ADHD, predominantly hyperactive type    Allergic rhinitis due to pollen 08/08/2010   Allergy vaccine 1:50,000 03/25/08; 1:10 03/27/09 > 1:2 GH >> DC'd 01/04/2015 for observation        Anemia    takes Fe -    Anginal pain    Ankylosing spondylitis 10/09/2018   Arthritis    Atherosclerosis of aorta 08/30/2020   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 Donnie Aho   Chronic asthma 05/17/2011   Chronic low back pain with sciatica 11/14/2021   Chronic pain 11/21/2016   COPD (chronic obstructive pulmonary disease)    Dizziness 12/24/2021   Early satiety 09/11/2021   Erectile dysfunction    Esophageal motility disorder 09/11/2021   Generalized anxiety disorder    GERD (gastroesophageal reflux disease)    History of benign positional vertigo    History of colonic polyps 09/11/2021   History of kidney stones    Hoarseness 09/11/2021   Hyperlipidemia    Hypertension    Hypogonadism male    prior use on Testim   Insomnia    Iron deficiency 09/11/2021   Left ventricular dysfunction 10/12/2018   Leg pain,  left 11/14/2021   Lumbar disc disease    Major depressive disorder    Mild concentric left ventricular hypertrophy (LVH)    Muscle spasm 11/14/2021   Old inferior myocardial infarction 1995   OSA (obstructive sleep apnea) 11/12/2022   NPSG 10/21/22- AHI 28.7/ hr, desat to 89%/Mean 93%, body weight 165 lbs     Pain in both feet 08/10/2021   Peripheral vascular disease 12/25/2021   Pneumonia    Presbycusis of both ears 04/23/2018   Shortness of breath dyspnea    Slow transit constipation 09/11/2021   Spinal cord stimulator status 11/14/2021   Spondylolisthesis of lumbar region 02/28/2015   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 Donnie Aho   CHEILECTOMY  10/16/2011   Procedure: CHEILECTOMY;  Surgeon: Sherri Rad, MD;  Location: [facility];  Service: Orthopedics;  Laterality: Right;   CHOLECYSTECTOMY     COLONOSCOPY     CORONARY ANGIOPLASTY     1995 after MI   CORONARY  ANGIOPLASTY WITH STENT PLACEMENT     1999    ESOPHAGOGASTRODUODENOSCOPY  02/18/2012   Procedure: ESOPHAGOGASTRODUODENOSCOPY (EGD);  Surgeon: Florencia Reasons, MD;  Location: [facility];  Service: Endoscopy;  Laterality: N/A;   FOOT SURGERY     joint scraped, arthritis right foot   HERNIA REPAIR     LUMBAR DISC SURGERY     x2   right index finger  mass removed- arthritis     Dr. Amanda Pea   [facility]  Right 05/18/2021   medial toe consistent with callus   SKIN BIOPSY Left 05/17/2021   actinic keratosis   SPINAL CORD STIMULATOR BATTERY EXCHANGE N/A 10/11/2021   Procedure: SPINAL CORD STIMULATOR BATTERY EXCHANGE;  Surgeon: Venita Lick, MD;  Location: [facility] OR;  Service: Orthopedics;  Laterality: N/A;  60 mins   SPINAL CORD STIMULATOR INSERTION N/A  11/21/2016   Procedure: LUMBAR SPINAL CORD STIMULATOR INSERTION;  Surgeon: Venita Lick, MD;  Location: [facility];  Service: Orthopedics;  Laterality: N/A;  Requests 2.5 hrs   TONSILLECTOMY     1953   UPPER GI ENDOSCOPY  [id number]   VASECTOMY     1980   Patient Active Problem List   Diagnosis Date Noted   Attention or concentration deficit [R41.840] 05/30/2023   MCI (mild cognitive impairment) 05/30/2023   Generalized anxiety disorder    Acquired hallux rigidus, right 03/25/2023   OSA (obstructive sleep apnea) 11/12/2022   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   Peripheral vascular disease 12/25/2021   Dizziness 12/24/2021   Leg pain, left 11/14/2021   Chronic low back pain with sciatica 11/14/2021   Spinal cord stimulator status 11/14/2021   Muscle spasm 11/14/2021   Slow transit constipation 09/11/2021   Early satiety 09/11/2021   Hoarseness 09/11/2021   Iron deficiency 09/11/2021   History of colonic polyps 09/11/2021   Weight decreased 09/11/2021   Esophageal motility disorder 09/11/2021   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   Pain in both feet 08/10/2021   Atherosclerosis of aorta 08/30/2020   Mild concentric left ventricular hypertrophy (LVH)    Hypogonadism male    History of kidney stones    Erectile dysfunction    COPD (chronic obstructive pulmonary disease)    Acquired trigger finger of left middle finger 04/22/2019   Acquired trigger finger of right middle finger 04/22/2019   Left ventricular dysfunction 10/12/2018   Ankylosing spondylitis 10/09/2018   Presbycusis of both ears 04/23/2018   Chronic pain 11/21/2016   Arthritis 10/15/2016   Spondylolisthesis of lumbar region 02/28/2015   Hyperlipidemia    Lumbar disc disease    Hypertension    CAD (coronary artery disease) 05/19/2012   GERD (gastroesophageal reflux disease)    Major depressive disorder    Chronic asthma 05/17/2011   Allergic rhinitis due to pollen 08/08/2010   History of benign positional vertigo    ADHD,  predominantly hyperactive type     ONSET DATE: 08/19/2023  REFERRING DIAG: G21.4 (ICD-10-CM) - Vascular parkinsonism (HCC)   THERAPY DIAG:  Other lack of coordination  Unsteadiness on feet  Other abnormalities of gait and mobility  Muscle weakness (generalized)  Abnormal posture  Rationale for Evaluation and Treatment: Rehabilitation  SUBJECTIVE:  SUBJECTIVE STATEMENT: Stopped taking the Carbidopa Levodopa since it was not helping. Getting up and down out of a chair is getting harder - any kind of chairs. Really helps with arm rests. Also having trouble with getting out of the car, especially when on an incline. No falls since last time. Using RW everywhere except in the house, just does some furniture walking. Noticed freezing more recently - like when going through a doorway. Has more when not using his RW. Notices that he drops some food. Not doing anything for exercise right now. Was shown a U-Step Rollator from Dr. Arbutus Leas. Worries about it taking up too much room. Has difficulty with getting up in the middle of the night, takes more of an effort. Has his vertigo when he gets up at night   Pt accompanied by:  Spouse, [name]   PERTINENT HISTORY: PMH: Vascular Parkinsonism (2024), CAD, anxiety, insomnia, depression, GERD, hypertension, lumbar disc disease with spinal cord stimulator, COPD, asthma, ADHD, chronic pain, chronic vertigo, MCI   PAIN:  Are you having pain? No  PRECAUTIONS: Fall Spinal cord stimulator    FALLS: Has patient fallen in last 6 months? No  LIVING ENVIRONMENT: Lives with: lives with their spouse Lives in: House/apartment Stairs: Yes: External: 3 in front and 5 in back steps; on right going up, on left going up, can reach both, and always uses back steps (where he has the  most falls)  Also has 5 steps to get down to utility room with no handrails  Has following equipment at home: Walker - 2 wheeled  PLOF: Independent with community mobility with device Notices more difficulty with buttons   PATIENT GOALS: "I want to be 78 years old"  Wants to get up out of a chair without feeling unsteady   OBJECTIVE:  Note: Objective measures were completed at Evaluation unless otherwise noted.   COGNITION: Overall cognitive status: Impaired, pt with MCI    SENSATION: Pt reports occasional tingling in R foot, notes L foot will just hurt   COORDINATION: Heel to shin: slightly slower with RLE    POSTURE: rounded shoulders, forward head, and posterior pelvic tilt  LOWER EXTREMITY ROM:    Decr knee extension AROM RLE > LLE   LOWER EXTREMITY MMT:    MMT Right Eval Left Eval  Hip flexion 5 5  Hip extension    Hip abduction 4 4  Hip adduction 5 5  Hip internal rotation    Hip external rotation    Knee flexion 5 5  Knee extension 4+ 4+  Ankle dorsiflexion 3- 4  Ankle plantarflexion    Ankle inversion    Ankle eversion    (Blank rows = not tested)  All tested In sitting   BED MOBILITY:  Pt reports more difficulty getting out of bed at night, pt does not give a specific answer of what is challenging about this   TRANSFERS: Assistive device utilized: None  Sit to stand: CGA Stand to sit: SBA and CGA  Pt needing to use BUE support to come to stand, decr anterior weight shift. In standing, pt with incr knee flexion. Pt with decr eccentric control when lowering to chair. Pt needs cues to fully back up to chair before sitting down.   Pt attempting to stand without UE support, pt does not scoot out far enough to edge of seated or have enough anterior weight shift, pt tries to rock and stand multiple times, but unable to come to stand  GAIT: Gait pattern: step through pattern, decreased step length- Right, decreased step length- Left, decreased stride  length, decreased hip/knee flexion- Right, decreased hip/knee flexion- Left, decreased ankle dorsiflexion- Right, decreased ankle dorsiflexion- Left, shuffling, trunk flexed, poor foot clearance- Right, and poor foot clearance- Left Distance walked: Clinic distances Assistive device utilized: Walker - 2 wheeled Level of assistance: SBA and CGA Comments: With use of RW, pt with decr foot clearance with RLE and incr forward flexed posture while pushing RW anteriorly. Also when turning, needs cues to keep RW on the ground instead of picking it up when turning. Pt intermittently catching R>L foot on the floor   FUNCTIONAL TESTS:  5 times sit to stand: 16.5 seconds with BUE support from chair  Timed up and go (TUG): 21.1 seconds with RW  10 meter walk test: 16.3 seconds = 2.01 ft/sec                                                                                                                                  TREATMENT DATE:  N/A during eval    PATIENT EDUCATION: Education details: Clinical findings, POC for 1x a week, areas that PT will address, importance of exercise with Parkinsonism and having an exercise program to work on after D/C, using RW at all times even in the house (pt reports that he is not using AD in the house and furniture walks) Person educated: Patient and Spouse Education method: Explanation Education comprehension: verbalized understanding and needs further education  HOME EXERCISE PROGRAM: Will review from previous bout of therapy and update as appropriate.   Walking program, starting with 3-4 minutes daily around the house with focus on tall posture, stride length, and staying close to RW    Seated PWR Moves Supine PWR Up   Access Code: [email or web address]  GOALS: Goals reviewed with patient? Yes  SHORT TERM GOALS: Target date: 09/22/2023  Pt will be independent with initial HEP for strengh, balance, walking in order to build upon functional gains made in  therapy. Baseline: Goal status: INITIAL  2.  Pt and pt's spouse will verbalize freezing strategies in order to decr risk of falls.  Baseline:  Goal status: INITIAL   LONG TERM GOALS: Target date: 10/13/2023  Pt will be independent with final HEP for strengh, balance, walking in order to build upon functional gains made in therapy. Baseline:  Goal status: INITIAL  2.  Pt will improve 5x sit<>stand to less than or equal to 17 sec without UE support to demonstrate improved functional strength and transfer efficiency.   Baseline: 16.5 seconds with BUE support from chair  Goal status: INITIAL  3.  Pt will improve TUG time to 18 seconds  or less with RW in order to demo decrease fall risk.  Baseline: 21.1 seconds with RW Goal status: INITIAL  4.  Pt will improve gait speed with RW to at least 2.3 ft/sec for improved gait efficiency  and step length in order to demo improved community mobility.   Baseline: 16.3 seconds = 2.01 ft/sec Goal status: INITIAL    ASSESSMENT:  CLINICAL IMPRESSION: Patient is a 78 year old male referred to Neuro OPPT for Vascular Parkinsonism. Pt seen recently for screens and was referred for an evaluation due to a decline in functional measures.   Pt's PMH is significant for: Vascular Parkinsonism (2024), CAD, anxiety, insomnia, depression, GERD, hypertension, lumbar disc disease with spinal cord stimulator, COPD, asthma, ADHD, chronic pain, chronic vertigo, MCI . The following deficits were present during the exam: decr functional strength, impaired coordination, postural abnormalities, impaired balance, impulsivity, shuffling/festination of gait, decr safety awareness, cognitive deficits, impulsivity, gait abnormalities. Based on 5x sit <> stand, and TUG pt is an incr risk for falls. Based on gait speed with RW, pt  is a limited community ambulator. Pt reporting not using RW in the house and furniture walking and also having more freezing episodes. Educated on use  of RW at all times to decr fall risk. Pt would benefit from skilled PT to address these impairments and functional limitations to maximize functional mobility independence.    OBJECTIVE IMPAIRMENTS: Abnormal gait, decreased activity tolerance, decreased balance, decreased cognition, decreased coordination, decreased endurance, decreased knowledge of use of DME, decreased mobility, difficulty walking, decreased ROM, decreased strength, decreased safety awareness, impaired flexibility, impaired sensation, and postural dysfunction.   ACTIVITY LIMITATIONS: carrying, lifting, bending, standing, stairs, transfers, bed mobility, reach over head, and locomotion level  PARTICIPATION LIMITATIONS: driving, shopping, community activity, and yard work  PERSONAL FACTORS: Age, Behavior pattern, Fitness, Past/current experiences, Time since onset of injury/illness/exacerbation, and 3+ comorbidities: Vascular Parkinsonism (2024), CAD, anxiety, insomnia, depression, GERD, hypertension, lumbar disc disease with spinal cord stimulator, COPD, asthma, ADHD, chronic pain, chronic vertigo, MCI   are also affecting patient's functional outcome.   REHAB POTENTIAL: Fair due to cognition and progressive diagnosis   CLINICAL DECISION MAKING: Evolving/moderate complexity  EVALUATION COMPLEXITY: Moderate  PLAN:  PT FREQUENCY: 1x/week  PT DURATION: 8 weeks  PLANNED INTERVENTIONS: 97164- PT Re-evaluation, 97110-Therapeutic exercises, 97530- Therapeutic activity, 97112- Neuromuscular re-education, 97535- Self Care, [id number]- Manual therapy, [phone]- Gait training, Patient/Family education, Balance training, Stair training, Vestibular training, and DME instructions  PLAN FOR NEXT SESSION: review previous HEP and get pt on an exercise program at home, work on sit <> stand transfers, try U-Step walker, freezing strategies    Drake Leach, PT, DPT 09/01/2023, 11:42 AM

## 2023-09-01 NOTE — Therapy (Signed)
 OUTPATIENT SPEECH LANGUAGE PATHOLOGY PARKINSON'S EVALUATION   Patient Name: [name] MRN: [id number] DOB:1946-06-07, 78 y.o., male Today's Date: 09/01/2023  PCP: Ronnald Nian, MD REFERRING PROVIDER: Vladimir Faster, DO  END OF SESSION:  End of Session - 09/01/23 0936     Visit Number 1    Number of Visits 17    Date for SLP Re-Evaluation 10/27/23    Authorization Type [employer] medicare    Progress Note Due on Visit 10    SLP Start Time 1100    SLP Stop Time  1145    SLP Time Calculation (min) 45 min    Activity Tolerance Patient tolerated treatment well             Past Medical History:  Diagnosis Date   Acquired hallux rigidus, right 03/25/2023   Acquired trigger finger of left middle finger 04/22/2019   Acquired trigger finger of right middle finger 04/22/2019   ADHD, predominantly hyperactive type    Allergic rhinitis due to pollen 08/08/2010   Allergy vaccine 1:50,000 03/25/08; 1:10 03/27/09 > 1:2 GH >> DC'd 01/04/2015 for observation        Anemia    takes Fe -    Anginal pain    Ankylosing spondylitis 10/09/2018   Arthritis    Atherosclerosis of aorta 08/30/2020   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 Donnie Aho   Chronic asthma 05/17/2011   Chronic low back pain with sciatica 11/14/2021   Chronic pain 11/21/2016   COPD (chronic obstructive pulmonary disease)    Dizziness 12/24/2021   Early satiety 09/11/2021   Erectile dysfunction    Esophageal motility disorder 09/11/2021   Generalized anxiety disorder    GERD (gastroesophageal reflux disease)    History of benign positional vertigo    History of colonic polyps 09/11/2021   History of kidney stones    Hoarseness 09/11/2021   Hyperlipidemia    Hypertension    Hypogonadism male    prior use on Testim   Insomnia    Iron deficiency 09/11/2021   Left ventricular dysfunction 10/12/2018   Leg pain, left 11/14/2021   Lumbar disc disease    Major depressive disorder     Mild concentric left ventricular hypertrophy (LVH)    Muscle spasm 11/14/2021   Old inferior myocardial infarction 1995   OSA (obstructive sleep apnea) 11/12/2022   NPSG 10/21/22- AHI 28.7/ hr, desat to 89%/Mean 93%, body weight 165 lbs     Pain in both feet 08/10/2021   Peripheral vascular disease 12/25/2021   Pneumonia    Presbycusis of both ears 04/23/2018   Shortness of breath dyspnea    Slow transit constipation 09/11/2021   Spinal cord stimulator status 11/14/2021   Spondylolisthesis of lumbar region 02/28/2015   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 Donnie Aho   CHEILECTOMY  10/16/2011   Procedure: CHEILECTOMY;  Surgeon: Sherri Rad, MD;  Location: [facility];  Service: Orthopedics;  Laterality: Right;   CHOLECYSTECTOMY     COLONOSCOPY     CORONARY ANGIOPLASTY     1995 after MI   CORONARY ANGIOPLASTY WITH STENT PLACEMENT     1999    ESOPHAGOGASTRODUODENOSCOPY  02/18/2012   Procedure: ESOPHAGOGASTRODUODENOSCOPY (EGD);  Surgeon: Florencia Reasons, MD;  Location: [facility];  Service: Endoscopy;  Laterality: N/A;   FOOT SURGERY     joint scraped, arthritis right foot   HERNIA REPAIR     LUMBAR DISC SURGERY     x2   right index finger  mass removed- arthritis     Dr. Amanda Pea   [facility]  Right 05/18/2021   medial toe consistent with callus   SKIN BIOPSY Left 05/17/2021   actinic keratosis   SPINAL CORD STIMULATOR BATTERY EXCHANGE N/A 10/11/2021   Procedure: SPINAL CORD STIMULATOR BATTERY EXCHANGE;  Surgeon: Venita Lick, MD;  Location: [facility] OR;  Service: Orthopedics;  Laterality: N/A;  60 mins   SPINAL CORD STIMULATOR INSERTION N/A 11/21/2016   Procedure: LUMBAR SPINAL CORD STIMULATOR INSERTION;   Surgeon: Venita Lick, MD;  Location: [facility] OR;  Service: Orthopedics;  Laterality: N/A;  Requests 2.5 hrs   TONSILLECTOMY     1953   UPPER GI ENDOSCOPY  [id number]   VASECTOMY     1980   Patient Active Problem List   Diagnosis Date Noted   Attention or concentration deficit [R41.840] 05/30/2023   MCI (mild cognitive impairment) 05/30/2023   Generalized anxiety disorder    Acquired hallux rigidus, right 03/25/2023   OSA (obstructive sleep apnea) 11/12/2022   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   Peripheral vascular disease 12/25/2021   Dizziness 12/24/2021   Leg pain, left 11/14/2021   Chronic low back pain with sciatica 11/14/2021   Spinal cord stimulator status 11/14/2021   Muscle spasm 11/14/2021   Slow transit constipation 09/11/2021   Early satiety 09/11/2021   Hoarseness 09/11/2021   Iron deficiency 09/11/2021   History of colonic polyps 09/11/2021   Weight decreased 09/11/2021   Esophageal motility disorder 09/11/2021   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   Pain in both feet 08/10/2021   Atherosclerosis of aorta 08/30/2020   Mild concentric left ventricular hypertrophy (LVH)    Hypogonadism male    History of kidney stones    Erectile dysfunction    COPD (chronic obstructive pulmonary disease)    Acquired trigger finger of left middle finger 04/22/2019   Acquired trigger finger of right middle finger 04/22/2019   Left ventricular dysfunction 10/12/2018   Ankylosing spondylitis 10/09/2018   Presbycusis of both ears 04/23/2018   Chronic pain 11/21/2016   Arthritis 10/15/2016   Spondylolisthesis of lumbar region 02/28/2015   Hyperlipidemia    Lumbar disc disease    Hypertension    CAD (coronary artery disease) 05/19/2012   GERD (gastroesophageal reflux disease)    Major depressive disorder    Chronic asthma 05/17/2011   Allergic rhinitis due to pollen 08/08/2010   History of benign positional vertigo    ADHD, predominantly hyperactive type     ONSET DATE: 08/19/2023 -  referral date  REFERRING DIAG: R49.0 (ICD-10-CM) - Dysphonia  THERAPY DIAG:  Dysarthria and anarthria  Cognitive communication deficit  Rationale for Evaluation and Treatment: Rehabilitation  SUBJECTIVE:   SUBJECTIVE STATEMENT: "I think I'm taking pretty well - she needs a hearing aid" Pt accompanied by: significant other [name]  PERTINENT HISTORY: Dr. Arbutus Leas has previously theorized a vascular parkinsonian presentation being more likely than idiopathic Parkinson's disease. Marthann Schiller is known to [place] from prior course of ST 11/29/22-02/04/23.   Neuropsych eval Nov 2024: Mr. Crymes' pattern of performance is suggestive of impairment surrounding executive functioning. Performance variability was further exhibited across processing speed, complex attention, semantic fluency, and both encoding (i.e., learning)  and delayed retrieval aspects of memory.         MCI             -suspect due to meds             -Neurocognitive testing with Dr. Milbert Coulter confirmed MCI in November, 2024.  He mentioned medications as a role in diminishing cognition.  PAIN:  Are you having pain? No  FALLS: Has patient fallen in last 6 months?  See PT evaluation for details  LIVING ENVIRONMENT: Lives with: lives with their spouse Lives in: House/apartment  PLOF:  Level of assistance: Independent with ADLs, Independent with IADLs Employment: Retired  PATIENT GOALS: to not be hoarse  OBJECTIVE:  Note: Objective measures were completed at Evaluation unless otherwise noted.  DIAGNOSTIC FINDINGS:   COGNITION: Overall cognitive status: Impaired per neurospych eval with Dr. Milbert Coulter Areas of impairment: Attention and Memory and executive function, processing speed, semantic fluency  Comments: See neurospsych testing results from 05/05/2023 - Dx MCI  MOTOR SPEECH: Overall motor speech: impaired Level of impairment: Phrase Respiration: thoracic breathing Phonation: hoarse and low vocal intensity Resonance:  WFL Articulation: Impaired: phrase Intelligibility: Intelligibility reduced Motor planning: Appears intact Motor speech errors: unaware Interfering components:  n/a Effective technique: increased vocal intensity  ORAL MOTOR EXAMINATION: Overall status: WFL Comments: reports some drool as well as dry mouth   OBJECTIVE VOICE ASSESSMENT: Sustained "ah" maximum phonation time: 15.11 seconds Sustained "ah" loudness average: 68 dB Oral reading (passage) loudness average: 64- dB Oral reading loudness range: 67-74 dB Conversational loudness average: 66 dB Conversational loudness range: 61-67 dB Voice quality: hoarse, low vocal intensity, and vocal fatigue Stimulability trials: Given SLP modeling and occasional min cues, loudness average increased to 85dB (range of 80 to 90dB) at loud "ah" and average of 74 on oral reading phrases  Comments:   Completed audio recording of patients baseline voice without cueing from SLP: Yes  Pt does not report difficulty with swallowing which does not warrant further evaluation.  PATIENT REPORTED OUTCOME MEASURES (PROM): Complete next session                                                                                                                            TREATMENT DATE:    Complete PROM next session  09/01/23 (eval day): Eval completed. Initiated training in compensatory strategies for slow processing - see Patient Instructions. Initiated Speak Out! Lesson one to review and instruct on HEP - With occasional min  A, Mitch achieved the dB targets for warm ups, sustained Ah and counting. He required usual mod A to maintain volume on glides and conversation exercises. Demonstrated the Parkinson Voice Project website and instructed Marthann Schiller and Gigi Gin to watch the What is Parkinsons and Living with Parkinsons webinar. Introduced the on line home practice sessions as well. In conversation, Marthann Schiller responded well to frequent mod verbal cues and visual cues to  carryover intentional speech to average 70dB. Provided a log  to track completion of HEP     PATIENT EDUCATION: Education details: See Treatment note, See Patient Instructions, compensations for cognitive impairments, HEP for dysarthria Person educated: Patient and Spouse Education method: Explanation, Demonstration, Verbal cues, and Handouts Education comprehension: verbalized understanding, returned demonstration, verbal cues required, and needs further education  HOME EXERCISE PROGRAM: Speak Out! Therapy Program to be completed twice daily    GOALS: Goals reviewed with patient? Yes  SHORT TERM GOALS: Target date: 09/29/23  Pt will complete HEP for dysarthria with occasional min A Baseline: Goal status: INITIAL  2.  Pt will average 72dB 18/20 sentences Baseline:  Goal status: INITIAL  3.  Pt will complete HEP twice a week 5/7 days outside of ST Baseline:  Goal status: INITIAL  4.  Pt will carryover 2 compensatory strategies to support processing speed in conversation Baseline:  Goal status: INITIAL  5.  Pt will average 72dB over 8 minute conversation with occasional min A Baseline:  Goal status: INITIAL   LONG TERM GOALS: Target date: 10/27/23  Pt will complete HEP for dysarthria with mod I Baseline:  Goal status: INITIAL  2.  Pt will ID and correct hoarse low volume with supervision cues Baseline:  Goal status: INITIAL  3.  Pt will average 72dB over 15 minute conversation Baseline:  Goal status: INITIAL  4.  Pt will participate in 2 on line home practice sessions on the Parkinson Voice Project website Baseline:  Goal status: INITIAL  5.  Pt will verbalize a plan/schedule to continue daily Speak Out! Practice after d/c from ST Baseline:  Goal status: INITIAL  6.  Pt will improve score on Communication Participation Item Bank Baseline:  Goal status: INITIAL  ASSESSMENT:  CLINICAL IMPRESSION: Patient is a 78 y.o. 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 His spouse, Gigi Gin reports difficulty hearing Mitch at a distance. Mitch endorses low volume and hoarse voice. He also endorses slow processing and difficulty getting a turn in fast conversations and group conversations. He named 15 animals and 10 "m" words in 1 minute. Mitch reports episodes where his throat "closes up" and "nothing will come out" in particular when signing. Of note, on the divergent naming task, Mitch twice had to be re-directed to task due to tangential conversation indicating possible attention impairment.(Likely prior ADHD per neuropsych). Marthann Schiller was easily stimulable for higher intensity voice which eliminate hoarse quality and improved intelligibility. I recommend skilled ST to maximize intelligibility for safety, independence and to reduce caregiver burden..   OBJECTIVE IMPAIRMENTS: Objective impairments include attention, memory, executive functioning, expressive language, and dysarthria. These impairments are limiting patient from effectively communicating at home and in community.Factors affecting potential to achieve goals and functional outcome are medical prognosis.. Patient will benefit from skilled SLP services to address above impairments and improve overall function.  REHAB POTENTIAL: Good  PLAN:  SLP FREQUENCY: 2x/week  SLP DURATION: 8 weeks  PLANNED INTERVENTIONS: Aspiration precaution training, Diet toleration management , Language facilitation, Environmental controls, Cueing hierachy, Cognitive reorganization, Internal/external aids, Functional tasks, Multimodal communication approach, SLP instruction and feedback, Compensatory strategies, Patient/family education, and 52841 Treatment of speech (30 or 45 min)     Osiah Haring, Radene Journey, CCC-SLP 09/01/2023, 12:10 PM

## 2023-09-01 NOTE — Patient Instructions (Addendum)
  Bring your workbook to each session - complete each lesson twice  Go to [employer] website  Under education - you and your spouse and family watch:  What is Parkinson's (about 25 minutes)  Living with Parkinson's (about 1 hour 15 minutes) if you need to break it into 2 sessions, stop at 36:45 then at a later time resume at 36:45.  These webinars are mandatory for participating in the Speak Out! Program  There are other interesting webinars on this website as well - check them out!  Processing speed can be slightly slow with people with PD. It is helpful to self advocate - "I need a minute to think" so that others don't fill in for you or ask follow up questions when you haven't answered their first question. People don't like pauses in conversation, so you may have to give them a hand or finger up. Stick up for yourself  You may find you are more passive in participating in group conversations such as family dinners or nights out with friends -    Get Mitch's attention before you speak  Use eye contact and face the person you are speaking to  Be in close proximity to the person you are speaking to  Turn down any noise in the environment such as the TV, walk away from loud appliances, air conditioners, fans, dish washers etc  In large gatherings, sit or stay on the side not the center of the room  Try to sit with a wall behind you or in a corner so noise isn't coming at you from all directions when dining out or attending gatherings  When talking on the phone, try to be in a quiet space with no distractions so you can focus only on the conversation  Biotene spray artificial saliva

## 2023-09-08 ENCOUNTER — Ambulatory Visit: Admitting: Speech Pathology

## 2023-09-08 ENCOUNTER — Encounter: Payer: Self-pay | Admitting: Speech Pathology

## 2023-09-08 ENCOUNTER — Encounter: Payer: Self-pay | Admitting: Physical Therapy

## 2023-09-08 ENCOUNTER — Ambulatory Visit: Admitting: Physical Therapy

## 2023-09-08 DIAGNOSIS — R2681 Unsteadiness on feet: Secondary | ICD-10-CM

## 2023-09-08 DIAGNOSIS — R41841 Cognitive communication deficit: Secondary | ICD-10-CM

## 2023-09-08 DIAGNOSIS — R471 Dysarthria and anarthria: Secondary | ICD-10-CM

## 2023-09-08 DIAGNOSIS — M6281 Muscle weakness (generalized): Secondary | ICD-10-CM

## 2023-09-08 DIAGNOSIS — R293 Abnormal posture: Secondary | ICD-10-CM

## 2023-09-08 NOTE — Therapy (Signed)
 OUTPATIENT SPEECH LANGUAGE PATHOLOGY [facility]   Patient Name: [name] MRN: [id number] DOB:1945/06/26, 78 y.o., male Today's Date: 09/08/2023  PCP: Ronnald Nian, MD REFERRING PROVIDER: Vladimir Faster, DO  END OF SESSION:  End of Session - 09/08/23 0924     Visit Number 2    Number of Visits 17    Date for SLP Re-Evaluation 10/27/23    Authorization Type [employer] medicare    Progress Note Due on Visit 10    SLP Start Time 0930    SLP Stop Time  1015    SLP Time Calculation (min) 45 min    Activity Tolerance Patient tolerated treatment well             Past Medical History:  Diagnosis Date   Acquired hallux rigidus, right 03/25/2023   Acquired trigger finger of left middle finger 04/22/2019   Acquired trigger finger of right middle finger 04/22/2019   ADHD, predominantly hyperactive type    Allergic rhinitis due to pollen 08/08/2010   Allergy vaccine 1:50,000 03/25/08; 1:10 03/27/09 > 1:2 GH >> DC'd 01/04/2015 for observation        Anemia    takes Fe -    Anginal pain    Ankylosing spondylitis 10/09/2018   Arthritis    Atherosclerosis of aorta 08/30/2020   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 Donnie Aho   Chronic asthma 05/17/2011   Chronic low back pain with sciatica 11/14/2021   Chronic pain 11/21/2016   COPD (chronic obstructive pulmonary disease)    Dizziness 12/24/2021   Early satiety 09/11/2021   Erectile dysfunction    Esophageal motility disorder 09/11/2021   Generalized anxiety disorder    GERD (gastroesophageal reflux disease)    History of benign positional vertigo    History of colonic polyps 09/11/2021   History of kidney stones    Hoarseness 09/11/2021   Hyperlipidemia    Hypertension    Hypogonadism male    prior use on [employer]   Insomnia    Iron deficiency 09/11/2021   Left ventricular dysfunction 10/12/2018   Leg pain, left 11/14/2021   Lumbar disc disease    Major depressive disorder     Mild concentric left ventricular hypertrophy (LVH)    Muscle spasm 11/14/2021   Old inferior myocardial infarction 1995   OSA (obstructive sleep apnea) 11/12/2022   NPSG 10/21/22- AHI 28.7/ hr, desat to 89%/Mean 93%, body weight 165 lbs     Pain in both feet 08/10/2021   Peripheral vascular disease 12/25/2021   Pneumonia    Presbycusis of both ears 04/23/2018   Shortness of breath dyspnea    Slow transit constipation 09/11/2021   Spinal cord stimulator status 11/14/2021   Spondylolisthesis of lumbar region 02/28/2015   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 Donnie Aho   CHEILECTOMY  10/16/2011   Procedure: CHEILECTOMY;  Surgeon: Sherri Rad, MD;  Location: [facility];  Service: Orthopedics;  Laterality: Right;   CHOLECYSTECTOMY     COLONOSCOPY     CORONARY ANGIOPLASTY     1995 after MI   CORONARY ANGIOPLASTY WITH STENT PLACEMENT     1999    ESOPHAGOGASTRODUODENOSCOPY  02/18/2012   Procedure: ESOPHAGOGASTRODUODENOSCOPY (EGD);  Surgeon: Florencia Reasons, MD;  Location: [facility];  Service: Endoscopy;  Laterality: N/A;   FOOT SURGERY     joint scraped, arthritis right foot   HERNIA REPAIR     LUMBAR DISC SURGERY     x2   right index finger  mass removed- arthritis     Dr. Amanda Pea   [facility]  Right 05/18/2021   medial toe consistent with callus   SKIN BIOPSY Left 05/17/2021   actinic keratosis   SPINAL CORD STIMULATOR BATTERY EXCHANGE N/A 10/11/2021   Procedure: SPINAL CORD STIMULATOR BATTERY EXCHANGE;  Surgeon: Venita Lick, MD;  Location: [facility];  Service: Orthopedics;  Laterality: N/A;  60 mins   SPINAL CORD STIMULATOR INSERTION N/A 11/21/2016   Procedure: LUMBAR SPINAL CORD STIMULATOR INSERTION;   Surgeon: Venita Lick, MD;  Location: [facility];  Service: Orthopedics;  Laterality: N/A;  Requests 2.5 hrs   TONSILLECTOMY     1953   UPPER GI ENDOSCOPY  [id number]   VASECTOMY     1980   Patient Active Problem List   Diagnosis Date Noted   Attention or concentration deficit [R41.840] 05/30/2023   MCI (mild cognitive impairment) 05/30/2023   Generalized anxiety disorder    Acquired hallux rigidus, right 03/25/2023   OSA (obstructive sleep apnea) 11/12/2022   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   Peripheral vascular disease 12/25/2021   Dizziness 12/24/2021   Leg pain, left 11/14/2021   Chronic low back pain with sciatica 11/14/2021   Spinal cord stimulator status 11/14/2021   Muscle spasm 11/14/2021   Slow transit constipation 09/11/2021   Early satiety 09/11/2021   Hoarseness 09/11/2021   Iron deficiency 09/11/2021   History of colonic polyps 09/11/2021   Weight decreased 09/11/2021   Esophageal motility disorder 09/11/2021   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   Pain in both feet 08/10/2021   Atherosclerosis of aorta 08/30/2020   Mild concentric left ventricular hypertrophy (LVH)    Hypogonadism male    History of kidney stones    Erectile dysfunction    COPD (chronic obstructive pulmonary disease)    Acquired trigger finger of left middle finger 04/22/2019   Acquired trigger finger of right middle finger 04/22/2019   Left ventricular dysfunction 10/12/2018   Ankylosing spondylitis 10/09/2018   Presbycusis of both ears 04/23/2018   Chronic pain 11/21/2016   Arthritis 10/15/2016   Spondylolisthesis of lumbar region 02/28/2015   Hyperlipidemia    Lumbar disc disease    Hypertension    CAD (coronary artery disease) 05/19/2012   GERD (gastroesophageal reflux disease)    Major depressive disorder    Chronic asthma 05/17/2011   Allergic rhinitis due to pollen 08/08/2010   History of benign positional vertigo    ADHD, predominantly hyperactive type     ONSET DATE: 08/19/2023 -  referral date  REFERRING DIAG: R49.0 (ICD-10-CM) - Dysphonia  THERAPY DIAG:  Dysarthria and anarthria  Cognitive communication deficit  Rationale for Evaluation and Treatment: Rehabilitation  SUBJECTIVE:   SUBJECTIVE STATEMENT: "I think I'm taking pretty well - she needs a hearing aid" Pt accompanied by: significant other [name]  PERTINENT HISTORY: Dr. Arbutus Leas has previously theorized a vascular parkinsonian presentation being more likely than idiopathic Parkinson's disease. Marthann Schiller is known to [place] from prior course of ST 11/29/22-02/04/23.   Neuropsych eval Nov 2024: Mr. Hoard' pattern of performance is suggestive of impairment surrounding executive functioning. Performance variability was further exhibited across processing speed, complex attention, semantic fluency, and both encoding (i.e., learning)  and delayed retrieval aspects of memory.         MCI             -suspect due to meds             -Neurocognitive testing with Dr. Milbert Coulter confirmed MCI in November, 2024.  He mentioned medications as a role in diminishing cognition.  PAIN:  Are you having pain? No  PATIENT REPORTED OUTCOME MEASURES (PROM): - Total 18 The Communicative Participation Item Bank        Does your condition interfere with... Pt Rating   ...talking with people you know 2   ...communicating when you need to say something quickly 3   ...talking with people you do not know 2   ...communicating when you are out in your community 1   ...asking questions in a conversation 2   ....communicating in a small group of people 2   ...having a long conversation 2   ...giving detailed infomrmation 3   ...getting your turn in a fast moving conversation 1   ...trying to persuade a friend or family member to see a different point of view 2  3= Not at all; 2=A little; 1=Quite a bit; 0=Very much                                                                                                                              TREATMENT DATE:    Complete PROM next session  09/08/23: They have not signed up for e Library on [employer] ([employer]) website - Instructed them to do this - submitted [name]'s email to [employer] for access to [employer]. They both watched the required webinar on P[employer] website. Targeted volume and intelligibility using Speak Out! Lesson April Week 1 on [employer] e Libary. Pt required occasional min  verbal cues, modeling, for volume and breath support. Pt averages the following volume levels:  Sustained AH: 85  Counting:80dB with occasional min cues for smooth and connected counting Pitch glides required usual mod modeling, gesture cues to achieve pitch change and prolong glides  Reading (phrases): 75  Cognitive Exercise: 72dB Required usual lmod verbal cues, modeling for carryover of intent /volume answering simple questions following structured practice.  Completed CPIB PROM - See above    09/01/23 (eval day): Eval completed. Initiated training in compensatory strategies for slow processing - see Patient Instructions. Initiated Speak Out! Lesson one to review and instruct on HEP - With occasional min  A, Mitch achieved the dB targets for warm ups, sustained Ah and counting. He required usual mod A to maintain volume on glides and conversation exercises. Demonstrated the Parkinson [employer] website and instructed Marthann Schiller and Gigi Gin to watch the What is Parkinsons and Living with Parkinsons webinar. Introduced the on line home practice sessions as well. In conversation, Marthann Schiller responded well to frequent mod verbal cues and visual cues to carryover intentional speech to average 70dB. Provided a log to  track completion of HEP     PATIENT EDUCATION: Education details: See Treatment note, See Patient Instructions, compensations for cognitive impairments, HEP for dysarthria Person educated: Patient and Spouse Education method: Explanation, Demonstration, Verbal cues, and Handouts Education comprehension:  verbalized understanding, returned demonstration, verbal cues required, and needs further education  HOME EXERCISE PROGRAM: Speak Out! Therapy Program to be completed twice daily    GOALS: Goals reviewed with patient? Yes  SHORT TERM GOALS: Target date: 09/29/23  Pt will complete HEP for dysarthria with occasional min A Baseline: Goal status: ONGOING  2.  Pt will average 72dB 18/20 sentences Baseline:  Goal status: ONGOING  3.  Pt will complete HEP twice a week 5/7 days outside of ST Baseline:  Goal status: ONGOING  4.  Pt will carryover 2 compensatory strategies to support processing speed in conversation Baseline:  Goal status:ONGOING  5.  Pt will average 72dB over 8 minute conversation with occasional min A Baseline:  Goal status: ONGOING   LONG TERM GOALS: Target date: 10/27/23  Pt will complete HEP for dysarthria with mod I Baseline:  Goal status:ONGOING  2.  Pt will ID and correct hoarse low volume with supervision cues Baseline:  Goal status: ONGOING  3.  Pt will average 72dB over 15 minute conversation Baseline:  Goal status: INITIAL  4.  Pt will participate in 2 on line home practice sessions on the Parkinson [employer] website Baseline:  Goal status: ONGOING  5.  Pt will verbalize a plan/schedule to continue daily Speak Out! Practice after d/c from ST Baseline:  Goal status:ONGOING  6.  Pt will improve score on Communication Participation Item Bank Baseline: 18 Goal status: ONGOING  ASSESSMENT:  CLINICAL IMPRESSION: Patient is a 78 y.o. 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 His spouse, Gigi Gin reports difficulty hearing Mitch at a distance. Mitch endorses low volume and hoarse voice. He also endorses slow processing and difficulty getting a turn in fast conversations and group conversations. He named 15 animals and 10 "m" words in  1 minute. Mitch reports episodes where his throat "closes up" and "nothing will come out" in particular when signing. Of note, on the divergent naming task, Mitch twice had to be re-directed to task due to tangential conversation indicating possible attention impairment.(Likely prior ADHD per neuropsych). Marthann Schiller was easily stimulable for higher intensity voice which eliminate hoarse quality and improved intelligibility. I recommend skilled ST to maximize intelligibility for safety, independence and to reduce caregiver burden..   OBJECTIVE IMPAIRMENTS: Objective impairments include attention, memory, executive functioning, expressive language, and dysarthria. These impairments are limiting patient from effectively communicating at home and in community.Factors affecting potential to achieve goals and functional outcome are medical prognosis.. Patient will benefit from skilled SLP services to address above impairments and improve overall function.  REHAB POTENTIAL: Good  PLAN:  SLP FREQUENCY: 2x/week  SLP DURATION: 8 weeks  PLANNED INTERVENTIONS: Aspiration precaution training, Diet toleration management , Language facilitation, Environmental controls, Cueing hierachy, Cognitive reorganization, Internal/external aids, Functional tasks, Multimodal communication approach, SLP instruction and feedback, Compensatory strategies, Patient/family education, and [id number] Treatment of speech (30 or 45 min)     Noris Kulinski, Radene Journey, CCC-SLP 09/08/2023, 12:05 PM

## 2023-09-08 NOTE — Patient Instructions (Signed)
Sit to Stand Transfers:  Scoot out to the edge of the chair Place your feet flat on the floor, shoulder width apart.  Make sure your feet are tucked just under your knees. Lean forward (nose over toes) with momentum, and stand up tall with your best posture.  If you need to use your arms, use them as a quick boost up to stand. If you are in a low or soft chair, you can lean back and then forward up to stand, in order to get more momentum. Once you are standing, make sure you are looking ahead and standing tall.  To sit down:  Back up until you feel the chair behind your legs. Bend at you hips, reaching  Back for you chair, if needed, then slowly squat to sit down on your chair.

## 2023-09-08 NOTE — Therapy (Signed)
 OUTPATIENT PHYSICAL THERAPY NEURO TREATMENT   Patient Name: [name] MRN: [id number] DOB:08/01/1945, 78 y.o., male Today's Date: 09/08/2023   PCP: Ronnald Nian, MD   REFERRING PROVIDER: Vladimir Faster, DO   END OF SESSION:  PT End of Session - 09/08/23 0849     Visit Number 2    Number of Visits 7    Date for PT Re-Evaluation 10/31/23    Authorization Type [employer] Medicare    PT Start Time 0847    PT Stop Time 0928    PT Time Calculation (min) 41 min    Equipment Utilized During Treatment Gait belt    Activity Tolerance Patient tolerated treatment well    Behavior During Therapy [facility] for tasks assessed/performed             Past Medical History:  Diagnosis Date   Acquired hallux rigidus, right 03/25/2023   Acquired trigger finger of left middle finger 04/22/2019   Acquired trigger finger of right middle finger 04/22/2019   ADHD, predominantly hyperactive type    Allergic rhinitis due to pollen 08/08/2010   Allergy vaccine 1:50,000 03/25/08; 1:10 03/27/09 > 1:2 GH >> DC'd 01/04/2015 for observation        Anemia    takes Fe -    Anginal pain    Ankylosing spondylitis 10/09/2018   Arthritis    Atherosclerosis of aorta 08/30/2020   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 Donnie Aho   Chronic asthma 05/17/2011   Chronic low back pain with sciatica 11/14/2021   Chronic pain 11/21/2016   COPD (chronic obstructive pulmonary disease)    Dizziness 12/24/2021   Early satiety 09/11/2021   Erectile dysfunction    Esophageal motility disorder 09/11/2021   Generalized anxiety disorder    GERD (gastroesophageal reflux disease)    History of benign positional vertigo    History of colonic polyps 09/11/2021   History of kidney stones    Hoarseness 09/11/2021   Hyperlipidemia    Hypertension    Hypogonadism male    prior use on Testim   Insomnia    Iron deficiency 09/11/2021   Left ventricular dysfunction 10/12/2018   Leg pain,  left 11/14/2021   Lumbar disc disease    Major depressive disorder    Mild concentric left ventricular hypertrophy (LVH)    Muscle spasm 11/14/2021   Old inferior myocardial infarction 1995   OSA (obstructive sleep apnea) 11/12/2022   NPSG 10/21/22- AHI 28.7/ hr, desat to 89%/Mean 93%, body weight 165 lbs     Pain in both feet 08/10/2021   Peripheral vascular disease 12/25/2021   Pneumonia    Presbycusis of both ears 04/23/2018   Shortness of breath dyspnea    Slow transit constipation 09/11/2021   Spinal cord stimulator status 11/14/2021   Spondylolisthesis of lumbar region 02/28/2015   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 Donnie Aho   CHEILECTOMY  10/16/2011   Procedure: CHEILECTOMY;  Surgeon: Sherri Rad, MD;  Location: [facility];  Service: Orthopedics;  Laterality: Right;   CHOLECYSTECTOMY     COLONOSCOPY     CORONARY ANGIOPLASTY     1995 after MI   CORONARY  ANGIOPLASTY WITH STENT PLACEMENT     1999    ESOPHAGOGASTRODUODENOSCOPY  02/18/2012   Procedure: ESOPHAGOGASTRODUODENOSCOPY (EGD);  Surgeon: Florencia Reasons, MD;  Location: [facility];  Service: Endoscopy;  Laterality: N/A;   FOOT SURGERY     joint scraped, arthritis right foot   HERNIA REPAIR     LUMBAR DISC SURGERY     x2   right index finger  mass removed- arthritis     Dr. Amanda Pea   [facility]  Right 05/18/2021   medial toe consistent with callus   SKIN BIOPSY Left 05/17/2021   actinic keratosis   SPINAL CORD STIMULATOR BATTERY EXCHANGE N/A 10/11/2021   Procedure: SPINAL CORD STIMULATOR BATTERY EXCHANGE;  Surgeon: Venita Lick, MD;  Location: [facility] OR;  Service: Orthopedics;  Laterality: N/A;  60 mins   SPINAL CORD STIMULATOR INSERTION N/A  11/21/2016   Procedure: LUMBAR SPINAL CORD STIMULATOR INSERTION;  Surgeon: Venita Lick, MD;  Location: [facility] OR;  Service: Orthopedics;  Laterality: N/A;  Requests 2.5 hrs   TONSILLECTOMY     1953   UPPER GI ENDOSCOPY  [id number]   VASECTOMY     1980   Patient Active Problem List   Diagnosis Date Noted   Attention or concentration deficit [R41.840] 05/30/2023   MCI (mild cognitive impairment) 05/30/2023   Generalized anxiety disorder    Acquired hallux rigidus, right 03/25/2023   OSA (obstructive sleep apnea) 11/12/2022   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   Peripheral vascular disease 12/25/2021   Dizziness 12/24/2021   Leg pain, left 11/14/2021   Chronic low back pain with sciatica 11/14/2021   Spinal cord stimulator status 11/14/2021   Muscle spasm 11/14/2021   Slow transit constipation 09/11/2021   Early satiety 09/11/2021   Hoarseness 09/11/2021   Iron deficiency 09/11/2021   History of colonic polyps 09/11/2021   Weight decreased 09/11/2021   Esophageal motility disorder 09/11/2021   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   Pain in both feet 08/10/2021   Atherosclerosis of aorta 08/30/2020   Mild concentric left ventricular hypertrophy (LVH)    Hypogonadism male    History of kidney stones    Erectile dysfunction    COPD (chronic obstructive pulmonary disease)    Acquired trigger finger of left middle finger 04/22/2019   Acquired trigger finger of right middle finger 04/22/2019   Left ventricular dysfunction 10/12/2018   Ankylosing spondylitis 10/09/2018   Presbycusis of both ears 04/23/2018   Chronic pain 11/21/2016   Arthritis 10/15/2016   Spondylolisthesis of lumbar region 02/28/2015   Hyperlipidemia    Lumbar disc disease    Hypertension    CAD (coronary artery disease) 05/19/2012   GERD (gastroesophageal reflux disease)    Major depressive disorder    Chronic asthma 05/17/2011   Allergic rhinitis due to pollen 08/08/2010   History of benign positional vertigo    ADHD,  predominantly hyperactive type     ONSET DATE: 08/19/2023  REFERRING DIAG: G21.4 (ICD-10-CM) - Vascular parkinsonism (HCC)   THERAPY DIAG:  Unsteadiness on feet  Abnormal posture  Muscle weakness (generalized)  Rationale for Evaluation and Treatment: Rehabilitation  SUBJECTIVE:  SUBJECTIVE STATEMENT: No falls. Sung at church over the weekend. Feels like he cries easily.   Pt accompanied by:  Spouse, [name]   PERTINENT HISTORY: PMH: Vascular Parkinsonism (2024), CAD, anxiety, insomnia, depression, GERD, hypertension, lumbar disc disease with spinal cord stimulator, COPD, asthma, ADHD, chronic pain, chronic vertigo, MCI   PAIN:  Are you having pain? No  PRECAUTIONS: Fall Spinal cord stimulator    FALLS: Has patient fallen in last 6 months? No  LIVING ENVIRONMENT: Lives with: lives with their spouse Lives in: House/apartment Stairs: Yes: External: 3 in front and 5 in back steps; on right going up, on left going up, can reach both, and always uses back steps (where he has the most falls)  Also has 5 steps to get down to utility room with no handrails  Has following equipment at home: Walker - 2 wheeled  PLOF: Independent with community mobility with device Notices more difficulty with buttons   PATIENT GOALS: "I want to be 78 years old"  Wants to get up out of a chair without feeling unsteady   OBJECTIVE:  Note: Objective measures were completed at Evaluation unless otherwise noted.   COGNITION: Overall cognitive status: Impaired, pt with MCI    SENSATION: Pt reports occasional tingling in R foot, notes L foot will just hurt   COORDINATION: Heel to shin: slightly slower with RLE    POSTURE: rounded shoulders, forward head, and posterior pelvic tilt  LOWER EXTREMITY ROM:     Decr knee extension AROM RLE > LLE   LOWER EXTREMITY MMT:    MMT Right Eval Left Eval  Hip flexion 5 5  Hip extension    Hip abduction 4 4  Hip adduction 5 5  Hip internal rotation    Hip external rotation    Knee flexion 5 5  Knee extension 4+ 4+  Ankle dorsiflexion 3- 4  Ankle plantarflexion    Ankle inversion    Ankle eversion    (Blank rows = not tested)  All tested In sitting   BED MOBILITY:  Pt reports more difficulty getting out of bed at night, pt does not give a specific answer of what is challenging about this   TRANSFERS: Assistive device utilized: None  Sit to stand: CGA Stand to sit: SBA and CGA  Pt needing to use BUE support to come to stand, decr anterior weight shift. In standing, pt with incr knee flexion. Pt with decr eccentric control when lowering to chair. Pt needs cues to fully back up to chair before sitting down.   Pt attempting to stand without UE support, pt does not scoot out far enough to edge of seated or have enough anterior weight shift, pt tries to rock and stand multiple times, but unable to come to stand   GAIT: Gait pattern: step through pattern, decreased step length- Right, decreased step length- Left, decreased stride length, decreased hip/knee flexion- Right, decreased hip/knee flexion- Left, decreased ankle dorsiflexion- Right, decreased ankle dorsiflexion- Left, shuffling, trunk flexed, poor foot clearance- Right, and poor foot clearance- Left Distance walked: Clinic distances Assistive device utilized: Walker - 2 wheeled Level of assistance: SBA and CGA Comments: With use of RW, pt with decr foot clearance with RLE and incr forward flexed posture while pushing RW anteriorly. Also when turning, needs cues to keep RW on the ground instead of picking it up when turning. Pt intermittently catching R>L foot on the floor   FUNCTIONAL TESTS:  5 times sit to stand: 16.5 seconds  with BUE support from chair  Timed up and go (TUG): 21.1  seconds with RW  10 meter walk test: 16.3 seconds = 2.01 ft/sec                                                                                                                                  TREATMENT DATE:   NMR:   Pt performs PWR! Moves in seated position x 10 reps   PWR! Up for improved posture  PWR! Rock for improved weight shifting, attempted kicking out leg, but pt reporting some sharp hip pain, so discontinued and just performed with reaching across body (pt felt better with this)  PWR! Twist for improved trunk rotation, cues to reset in the middle with tall posture   PWR! Step for improved step initiation, stepping with both feet big in and out   Cues provided for technique and how each relates to function and working on larger amplitude movements. Demo cues needed   Pt reporting RPE as 6/10   Sit <> stands from mat table: Focus on scooting out to the edge, tucking feet back, and getting nose over toes, pt performing without UE support to stand, pt using hands and reaching back to mat table to sit down, performed 10 reps, intermittent cues for set up Cues for posture once in standing and looking ahead and for hip extension Performed an additional 5 reps at the end of session without verbal cues from therapist with pt able to perform without retropulsion   Pt needing cues to turn fully around with RW before sitting down to mat table as pt tends to leave it behind and then go to sit down without any support.  When ambulating, cues for walking with intent with focus on heel strike and taking larger steps   Self-Care: Went over differences between Parkinsons and Parkinsonisms and how each responds to Sinemet Educated on importance of exercise with Parkinsonisms   PATIENT EDUCATION: Education details: See self-care section, initial HEP for seated PWR moves and sit <> stands  Person educated: Patient and Spouse Education method: Programmer, multimedia, Facilities manager, Verbal cues, and  Handouts Education comprehension: verbalized understanding, returned demonstration, and needs further education  HOME EXERCISE PROGRAM: Access Code: [facility] URL: [email or web address] Date: 09/08/2023 Prepared by: Sherlie Ban  Exercises - Sit to Stand  - 1-2 x daily - 5 x weekly - 2 sets - 10 reps  Seated PWR Moves  GOALS: Goals reviewed with patient? Yes  SHORT TERM GOALS: Target date: 09/22/2023  Pt will be independent with initial HEP for strengh, balance, walking in order to build upon functional gains made in therapy. Baseline: Goal status: INITIAL  2.  Pt and pt's spouse will verbalize freezing strategies in order to decr risk of falls.  Baseline:  Goal status: INITIAL   LONG TERM GOALS: Target date: 10/13/2023  Pt will be independent with final HEP for strengh, balance, walking in order to  build upon functional gains made in therapy. Baseline:  Goal status: INITIAL  2.  Pt will improve 5x sit<>stand to less than or equal to 17 sec without UE support to demonstrate improved functional strength and transfer efficiency.   Baseline: 16.5 seconds with BUE support from chair  Goal status: INITIAL  3.  Pt will improve TUG time to 18 seconds  or less with RW in order to demo decrease fall risk.  Baseline: 21.1 seconds with RW Goal status: INITIAL  4.  Pt will improve gait speed with RW to at least 2.3 ft/sec for improved gait efficiency and step length in order to demo improved community mobility.   Baseline: 16.3 seconds = 2.01 ft/sec Goal status: INITIAL    ASSESSMENT:  CLINICAL IMPRESSION: Today's skilled session focused on reviewing prior HEP for seated PWR moves and sit <> stands. Pt had previously not been doing exercises at home and educated on importance of exercise with Parkinsonism. Pt demonstrated good understanding of sit <> stand technique today and able to perform from mat table without retropulsion and no UE support. At end of  session, pt also able to perform without verbal cues from therapist. Will continue per POC.     OBJECTIVE IMPAIRMENTS: Abnormal gait, decreased activity tolerance, decreased balance, decreased cognition, decreased coordination, decreased endurance, decreased knowledge of use of DME, decreased mobility, difficulty walking, decreased ROM, decreased strength, decreased safety awareness, impaired flexibility, impaired sensation, and postural dysfunction.   ACTIVITY LIMITATIONS: carrying, lifting, bending, standing, stairs, transfers, bed mobility, reach over head, and locomotion level  PARTICIPATION LIMITATIONS: driving, shopping, community activity, and yard work  PERSONAL FACTORS: Age, Behavior pattern, Fitness, Past/current experiences, Time since onset of injury/illness/exacerbation, and 3+ comorbidities: Vascular Parkinsonism (2024), CAD, anxiety, insomnia, depression, GERD, hypertension, lumbar disc disease with spinal cord stimulator, COPD, asthma, ADHD, chronic pain, chronic vertigo, MCI   are also affecting patient's functional outcome.   REHAB POTENTIAL: Fair due to cognition and progressive diagnosis   CLINICAL DECISION MAKING: Evolving/moderate complexity  EVALUATION COMPLEXITY: Moderate  PLAN:  PT FREQUENCY: 1x/week  PT DURATION: 8 weeks  PLANNED INTERVENTIONS: 97164- PT Re-evaluation, 97110-Therapeutic exercises, 97530- Therapeutic activity, 97112- Neuromuscular re-education, 97535- Self Care, 16109- Manual therapy, [phone]- Gait training, Patient/Family education, Balance training, Stair training, Vestibular training, and DME instructions  PLAN FOR NEXT SESSION: review previous HEP and get pt on an exercise program at home, work on sit <> stand transfers, try U-Step walker, freezing strategies    Drake Leach, PT, DPT 09/08/2023, 9:32 AM

## 2023-09-10 ENCOUNTER — Ambulatory Visit: Admitting: Occupational Therapy

## 2023-09-10 ENCOUNTER — Ambulatory Visit: Attending: Neurology | Admitting: Speech Pathology

## 2023-09-10 ENCOUNTER — Encounter: Payer: Self-pay | Admitting: Speech Pathology

## 2023-09-10 ENCOUNTER — Ambulatory Visit: Admitting: Physical Therapy

## 2023-09-10 ENCOUNTER — Encounter: Payer: Self-pay | Admitting: Physical Therapy

## 2023-09-10 DIAGNOSIS — R2689 Other abnormalities of gait and mobility: Secondary | ICD-10-CM

## 2023-09-10 DIAGNOSIS — R41842 Visuospatial deficit: Secondary | ICD-10-CM | POA: Insufficient documentation

## 2023-09-10 DIAGNOSIS — R278 Other lack of coordination: Secondary | ICD-10-CM | POA: Insufficient documentation

## 2023-09-10 DIAGNOSIS — R293 Abnormal posture: Secondary | ICD-10-CM

## 2023-09-10 DIAGNOSIS — M5459 Other low back pain: Secondary | ICD-10-CM | POA: Diagnosis present

## 2023-09-10 DIAGNOSIS — M6281 Muscle weakness (generalized): Secondary | ICD-10-CM

## 2023-09-10 DIAGNOSIS — R41841 Cognitive communication deficit: Secondary | ICD-10-CM | POA: Diagnosis present

## 2023-09-10 DIAGNOSIS — R296 Repeated falls: Secondary | ICD-10-CM | POA: Insufficient documentation

## 2023-09-10 DIAGNOSIS — R2681 Unsteadiness on feet: Secondary | ICD-10-CM | POA: Diagnosis present

## 2023-09-10 DIAGNOSIS — R41840 Attention and concentration deficit: Secondary | ICD-10-CM | POA: Insufficient documentation

## 2023-09-10 DIAGNOSIS — R471 Dysarthria and anarthria: Secondary | ICD-10-CM | POA: Insufficient documentation

## 2023-09-10 NOTE — Therapy (Signed)
 OUTPATIENT PHYSICAL THERAPY NEURO TREATMENT   Patient Name: [name] MRN: [id number] DOB:[birth year], 78 y.o., male Today's Date: 09/10/2023   PCP: Ronnald Nian, MD   REFERRING PROVIDER: Vladimir Faster, DO   END OF SESSION:  PT End of Session - 09/10/23 0937     Visit Number 3    Number of Visits 7    Date for PT Re-Evaluation 10/31/23    Authorization Type [employer] Medicare    PT Start Time 0934    PT Stop Time 1014    PT Time Calculation (min) 40 min    Equipment Utilized During Treatment Gait belt    Activity Tolerance Patient tolerated treatment well    Behavior During Therapy WFL for tasks assessed/performed             Past Medical History:  Diagnosis Date   Acquired hallux rigidus, right 03/25/2023   Acquired trigger finger of left middle finger 04/22/2019   Acquired trigger finger of right middle finger 04/22/2019   ADHD, predominantly hyperactive type    Allergic rhinitis due to pollen 08/08/2010   Allergy vaccine 1:50,000 03/25/08; 1:10 03/27/09 > 1:2 GH >> DC'd 01/04/2015 for observation        Anemia    takes Fe -    Anginal pain    Ankylosing spondylitis 10/09/2018   Arthritis    Atherosclerosis of aorta 08/30/2020   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 Donnie Aho   Chronic asthma 05/17/2011   Chronic low back pain with sciatica 11/14/2021   Chronic pain 11/21/2016   COPD (chronic obstructive pulmonary disease)    Dizziness 12/24/2021   Early satiety 09/11/2021   Erectile dysfunction    Esophageal motility disorder 09/11/2021   Generalized anxiety disorder    GERD (gastroesophageal reflux disease)    History of benign positional vertigo    History of colonic polyps 09/11/2021   History of kidney stones    Hoarseness 09/11/2021   Hyperlipidemia    Hypertension    Hypogonadism male    prior use on Testim   Insomnia    Iron deficiency 09/11/2021   Left ventricular dysfunction 10/12/2018   Leg pain, left  11/14/2021   Lumbar disc disease    Major depressive disorder    Mild concentric left ventricular hypertrophy (LVH)    Muscle spasm 11/14/2021   Old inferior myocardial infarction 1995   OSA (obstructive sleep apnea) 11/12/2022   NPSG 10/21/22- AHI 28.7/ hr, desat to 89%/Mean 93%, body weight 165 lbs     Pain in both feet 08/10/2021   Peripheral vascular disease 12/25/2021   Pneumonia    Presbycusis of both ears 04/23/2018   Shortness of breath dyspnea    Slow transit constipation 09/11/2021   Spinal cord stimulator status 11/14/2021   Spondylolisthesis of lumbar region 02/28/2015   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 Donnie Aho   CHEILECTOMY  10/16/2011   Procedure: CHEILECTOMY;  Surgeon: Sherri Rad, MD;  Location: [facility];  Service: Orthopedics;  Laterality: Right;   CHOLECYSTECTOMY     COLONOSCOPY     CORONARY ANGIOPLASTY     1995 after MI   CORONARY  ANGIOPLASTY WITH STENT PLACEMENT     1999    ESOPHAGOGASTRODUODENOSCOPY  02/18/2012   Procedure: ESOPHAGOGASTRODUODENOSCOPY (EGD);  Surgeon: Florencia Reasons, MD;  Location: [facility];  Service: Endoscopy;  Laterality: N/A;   FOOT SURGERY     joint scraped, arthritis right foot   HERNIA REPAIR     LUMBAR DISC SURGERY     x2   right index finger  mass removed- arthritis     Dr. Amanda Pea   [facility]  Right 05/18/2021   medial toe consistent with callus   SKIN BIOPSY Left 05/17/2021   actinic keratosis   SPINAL CORD STIMULATOR BATTERY EXCHANGE N/A 10/11/2021   Procedure: SPINAL CORD STIMULATOR BATTERY EXCHANGE;  Surgeon: Venita Lick, MD;  Location: [facility];  Service: Orthopedics;  Laterality: N/A;  60 mins   SPINAL CORD STIMULATOR INSERTION N/A  11/21/2016   Procedure: LUMBAR SPINAL CORD STIMULATOR INSERTION;  Surgeon: Venita Lick, MD;  Location: [facility];  Service: Orthopedics;  Laterality: N/A;  Requests 2.5 hrs   TONSILLECTOMY     1953   UPPER GI ENDOSCOPY  [id number]   VASECTOMY     1980   Patient Active Problem List   Diagnosis Date Noted   Attention or concentration deficit [R41.840] 05/30/2023   MCI (mild cognitive impairment) 05/30/2023   Generalized anxiety disorder    Acquired hallux rigidus, right 03/25/2023   OSA (obstructive sleep apnea) 11/12/2022   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   Peripheral vascular disease 12/25/2021   Dizziness 12/24/2021   Leg pain, left 11/14/2021   Chronic low back pain with sciatica 11/14/2021   Spinal cord stimulator status 11/14/2021   Muscle spasm 11/14/2021   Slow transit constipation 09/11/2021   Early satiety 09/11/2021   Hoarseness 09/11/2021   Iron deficiency 09/11/2021   History of colonic polyps 09/11/2021   Weight decreased 09/11/2021   Esophageal motility disorder 09/11/2021   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   Pain in both feet 08/10/2021   Atherosclerosis of aorta 08/30/2020   Mild concentric left ventricular hypertrophy (LVH)    Hypogonadism male    History of kidney stones    Erectile dysfunction    COPD (chronic obstructive pulmonary disease)    Acquired trigger finger of left middle finger 04/22/2019   Acquired trigger finger of right middle finger 04/22/2019   Left ventricular dysfunction 10/12/2018   Ankylosing spondylitis 10/09/2018   Presbycusis of both ears 04/23/2018   Chronic pain 11/21/2016   Arthritis 10/15/2016   Spondylolisthesis of lumbar region 02/28/2015   Hyperlipidemia    Lumbar disc disease    Hypertension    CAD (coronary artery disease) 05/19/2012   GERD (gastroesophageal reflux disease)    Major depressive disorder    Chronic asthma 05/17/2011   Allergic rhinitis due to pollen 08/08/2010   History of benign positional vertigo    ADHD,  predominantly hyperactive type     ONSET DATE: 08/19/2023  REFERRING DIAG: G21.4 (ICD-10-CM) - Vascular parkinsonism (HCC)   THERAPY DIAG:  Unsteadiness on feet  Abnormal posture  Muscle weakness (generalized)  Other abnormalities of gait and mobility  Rationale for Evaluation and Treatment: Rehabilitation  SUBJECTIVE:  SUBJECTIVE STATEMENT: Reports [name] couldn't come today as she had vertigo, got a ride from a good friend. Had a sort of fall. Was not using a walker and bumped into it and then fell onto his walker. Got up on his own. Did not hurt himself or hit his head. Reports it happened Monday night   Pt accompanied by:  Self   PERTINENT HISTORY: PMH: Vascular Parkinsonism (2024), CAD, anxiety, insomnia, depression, GERD, hypertension, lumbar disc disease with spinal cord stimulator, COPD, asthma, ADHD, chronic pain, chronic vertigo, MCI   PAIN:  Are you having pain? No  PRECAUTIONS: Fall Spinal cord stimulator    FALLS: Has patient fallen in last 6 months? No  LIVING ENVIRONMENT: Lives with: lives with their spouse Lives in: House/apartment Stairs: Yes: External: 3 in front and 5 in back steps; on right going up, on left going up, can reach both, and always uses back steps (where he has the most falls)  Also has 5 steps to get down to utility room with no handrails  Has following equipment at home: Walker - 2 wheeled  PLOF: Independent with community mobility with device Notices more difficulty with buttons   PATIENT GOALS: "I want to be 78 years old"  Wants to get up out of a chair without feeling unsteady   OBJECTIVE:  Note: Objective measures were completed at Evaluation unless otherwise noted.   COGNITION: Overall cognitive status: Impaired, pt with MCI     SENSATION: Pt reports occasional tingling in R foot, notes L foot will just hurt   COORDINATION: Heel to shin: slightly slower with RLE    POSTURE: rounded shoulders, forward head, and posterior pelvic tilt  LOWER EXTREMITY ROM:    Decr knee extension AROM RLE > LLE   LOWER EXTREMITY MMT:    MMT Right Eval Left Eval  Hip flexion 5 5  Hip extension    Hip abduction 4 4  Hip adduction 5 5  Hip internal rotation    Hip external rotation    Knee flexion 5 5  Knee extension 4+ 4+  Ankle dorsiflexion 3- 4  Ankle plantarflexion    Ankle inversion    Ankle eversion    (Blank rows = not tested)  All tested In sitting   BED MOBILITY:  Pt reports more difficulty getting out of bed at night, pt does not give a specific answer of what is challenging about this   TRANSFERS: Assistive device utilized: None  Sit to stand: CGA Stand to sit: SBA and CGA  Pt needing to use BUE support to come to stand, decr anterior weight shift. In standing, pt with incr knee flexion. Pt with decr eccentric control when lowering to chair. Pt needs cues to fully back up to chair before sitting down.   Pt attempting to stand without UE support, pt does not scoot out far enough to edge of seated or have enough anterior weight shift, pt tries to rock and stand multiple times, but unable to come to stand   GAIT: Gait pattern: step through pattern, decreased step length- Right, decreased step length- Left, decreased stride length, decreased hip/knee flexion- Right, decreased hip/knee flexion- Left, decreased ankle dorsiflexion- Right, decreased ankle dorsiflexion- Left, shuffling, trunk flexed, poor foot clearance- Right, and poor foot clearance- Left Distance walked: Clinic distances Assistive device utilized: Walker - 2 wheeled Level of assistance: SBA and CGA Comments: With use of RW, pt with decr foot clearance with RLE and incr forward flexed posture while pushing  RW anteriorly. Also when turning,  needs cues to keep RW on the ground instead of picking it up when turning. Pt intermittently catching R>L foot on the floor   FUNCTIONAL TESTS:  5 times sit to stand: 16.5 seconds with BUE support from chair  Timed up and go (TUG): 21.1 seconds with RW  10 meter walk test: 16.3 seconds = 2.01 ft/sec                                                                                                                                  TREATMENT DATE:   Self-Care: Went over again during session due differences between Parkinsons and Parkinsonisms and how each responds to Sinemet Educated to use RW all the time in the community as well as in the house due to recent fall and pt's fall risk/hx of freezing episodes Provided pt with new tennis balls for easier propulsion with RW   Ther Ex  SciFit multi-peaks level 4.0 for 8 minutes using BUE/BLEs for neural priming for reciprocal movement, dynamic cardiovascular warmup and increased amplitude of stepping. Pt initially reporting RPE as 4/10, cued to try to work towards a 7/10 for larger movement patterns and level of effort. Pt needs cues throughout to focus on larger amplitude movements and kicking legs out for knee extension. Cues to keep spm >90. Pt reporting 9/10 RPE at the end   NMR:  At ballet bar: Trunk rotation with Squigz, having wide BOS for weight shifting - reaching across body to grab Squigz from therapist and then placing on ipsilateral side of body to 'X' target for reaching superior/laterally and outside of BOS, performed 10 reps to each side, pt performing with no UE support  Alternating lateral stepping over 2" obstacle, 15 reps each side, performed with no UE support, cues to focus on slowed pace and deliberate foot clearance when stepping over obstacle, CGA for balance   Sit <> stands from mat table: Focus on scooting out to the edge, tucking feet back, and getting nose over toes, pt performing without UE support, 10 reps, intermittent  cues to make sure pt gets incr forward lean to stand   Pt needing cues to turn fully around with RW before sitting down to mat table   When ambulating, cues for walking with intent with focus on heel strike and taking larger steps and staying inside RW as pt pushes RW too far anteriorly     PATIENT EDUCATION: Education details: See self-care section Person educated: Patient Education method: Explanation Education comprehension: verbalized understanding, returned demonstration, and needs further education  HOME EXERCISE PROGRAM: Access Code: [facility] URL: [email or web address] Date: 09/08/2023 Prepared by: Sherlie Ban  Exercises - Sit to Stand  - 1-2 x daily - 5 x weekly - 2 sets - 10 reps  Seated PWR Moves  GOALS: Goals reviewed with patient? Yes  SHORT TERM GOALS: Target date: 09/22/2023  Pt will be independent with initial  HEP for strengh, balance, walking in order to build upon functional gains made in therapy. Baseline: Goal status: INITIAL  2.  Pt and pt's spouse will verbalize freezing strategies in order to decr risk of falls.  Baseline:  Goal status: INITIAL   LONG TERM GOALS: Target date: 10/13/2023  Pt will be independent with final HEP for strengh, balance, walking in order to build upon functional gains made in therapy. Baseline:  Goal status: INITIAL  2.  Pt will improve 5x sit<>stand to less than or equal to 17 sec without UE support to demonstrate improved functional strength and transfer efficiency.   Baseline: 16.5 seconds with BUE support from chair  Goal status: INITIAL  3.  Pt will improve TUG time to 18 seconds  or less with RW in order to demo decrease fall risk.  Baseline: 21.1 seconds with RW Goal status: INITIAL  4.  Pt will improve gait speed with RW to at least 2.3 ft/sec for improved gait efficiency and step length in order to demo improved community mobility.   Baseline: 16.3 seconds = 2.01 ft/sec Goal status:  INITIAL    ASSESSMENT:  CLINICAL IMPRESSION: Pt reporting having a fall the other day due to bumping into his walker when not using it. Went over again making sure that pt is using RW at all times, even inside the house. Today's skilled session focused on SciFit for working on larger amplitude movement patterns at start of session with cued to work towards a 7/10 RPE (pt initially reporting effort level as 4/10). Also worked on balance tasks with decr UE support with trunk rotation and foot clearance when stepping over obstacles laterally. Cued for slowed pace and deliberate movement patterns as pt tends to move too quickly. Re-cert sent to include POC to be 1-2x a week as pt schedule this week 2 times. Pt scheduled for 1x week throughout remainder of POC. Will continue per POC.     OBJECTIVE IMPAIRMENTS: Abnormal gait, decreased activity tolerance, decreased balance, decreased cognition, decreased coordination, decreased endurance, decreased knowledge of use of DME, decreased mobility, difficulty walking, decreased ROM, decreased strength, decreased safety awareness, impaired flexibility, impaired sensation, and postural dysfunction.   ACTIVITY LIMITATIONS: carrying, lifting, bending, standing, stairs, transfers, bed mobility, reach over head, and locomotion level  PARTICIPATION LIMITATIONS: driving, shopping, community activity, and yard work  PERSONAL FACTORS: Age, Behavior pattern, Fitness, Past/current experiences, Time since onset of injury/illness/exacerbation, and 3+ comorbidities: Vascular Parkinsonism (2024), CAD, anxiety, insomnia, depression, GERD, hypertension, lumbar disc disease with spinal cord stimulator, COPD, asthma, ADHD, chronic pain, chronic vertigo, MCI   are also affecting patient's functional outcome.   REHAB POTENTIAL: Fair due to cognition and progressive diagnosis   CLINICAL DECISION MAKING: Evolving/moderate complexity  EVALUATION COMPLEXITY: Moderate  PLAN:  PT  FREQUENCY: 1-2x/week  PT DURATION: 8 weeks  PLANNED INTERVENTIONS: 97164- PT Re-evaluation, 97110-Therapeutic exercises, 97530- Therapeutic activity, 97112- Neuromuscular re-education, 97535- Self Care, 78295- Manual therapy, [phone]- Gait training, Patient/Family education, Balance training, Stair training, Vestibular training, and DME instructions  PLAN FOR NEXT SESSION: work on balance, give walking program for home, work on sit <> stand transfers, try U-Step walker, freezing strategies    Drake Leach, PT, DPT 09/10/2023, 10:39 AM

## 2023-09-10 NOTE — Therapy (Signed)
 OUTPATIENT SPEECH LANGUAGE PATHOLOGY [facility]   Patient Name: [name] MRN: [id number] DOB:03/20/1946, 78 y.o., male Today's Date: 09/10/2023  PCP: Ronnald Nian, MD REFERRING PROVIDER: Vladimir Faster, DO  END OF SESSION:  End of Session - 09/10/23 1019     Visit Number 3    Number of Visits 17    Date for SLP Re-Evaluation 10/27/23    Authorization Type [employer] medicare    SLP Start Time 1019    SLP Stop Time  1100    SLP Time Calculation (min) 41 min    Activity Tolerance Patient tolerated treatment well             Past Medical History:  Diagnosis Date   Acquired hallux rigidus, right 03/25/2023   Acquired trigger finger of left middle finger 04/22/2019   Acquired trigger finger of right middle finger 04/22/2019   ADHD, predominantly hyperactive type    Allergic rhinitis due to pollen 08/08/2010   Allergy vaccine 1:50,000 03/25/08; 1:10 03/27/09 > 1:2 GH >> DC'd 01/04/2015 for observation        Anemia    takes Fe -    Anginal pain    Ankylosing spondylitis 10/09/2018   Arthritis    Atherosclerosis of aorta 08/30/2020   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 Donnie Aho   Chronic asthma 05/17/2011   Chronic low back pain with sciatica 11/14/2021   Chronic pain 11/21/2016   COPD (chronic obstructive pulmonary disease)    Dizziness 12/24/2021   Early satiety 09/11/2021   Erectile dysfunction    Esophageal motility disorder 09/11/2021   Generalized anxiety disorder    GERD (gastroesophageal reflux disease)    History of benign positional vertigo    History of colonic polyps 09/11/2021   History of kidney stones    Hoarseness 09/11/2021   Hyperlipidemia    Hypertension    Hypogonadism male    prior use on [employer]   Insomnia    Iron deficiency 09/11/2021   Left ventricular dysfunction 10/12/2018   Leg pain, left 11/14/2021   Lumbar disc disease    Major depressive disorder    Mild concentric left ventricular  hypertrophy (LVH)    Muscle spasm 11/14/2021   Old inferior myocardial infarction 1995   OSA (obstructive sleep apnea) 11/12/2022   NPSG 10/21/22- AHI 28.7/ hr, desat to 89%/Mean 93%, body weight 165 lbs     Pain in both feet 08/10/2021   Peripheral vascular disease 12/25/2021   Pneumonia    Presbycusis of both ears 04/23/2018   Shortness of breath dyspnea    Slow transit constipation 09/11/2021   Spinal cord stimulator status 11/14/2021   Spondylolisthesis of lumbar region 02/28/2015   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 Donnie Aho   CHEILECTOMY  10/16/2011   Procedure: CHEILECTOMY;  Surgeon: Sherri Rad, MD;  Location: [facility];  Service: Orthopedics;  Laterality: Right;   CHOLECYSTECTOMY     COLONOSCOPY     CORONARY ANGIOPLASTY     1995 after MI   CORONARY ANGIOPLASTY WITH STENT PLACEMENT     1999    ESOPHAGOGASTRODUODENOSCOPY  02/18/2012   Procedure: ESOPHAGOGASTRODUODENOSCOPY (EGD);  Surgeon:  Florencia Reasons, MD;  Location: [facility];  Service: Endoscopy;  Laterality: N/A;   FOOT SURGERY     joint scraped, arthritis right foot   HERNIA REPAIR     LUMBAR DISC SURGERY     x2   right index finger  mass removed- arthritis     Dr. Amanda Pea   [facility]  Right 05/18/2021   medial toe consistent with callus   SKIN BIOPSY Left 05/17/2021   actinic keratosis   SPINAL CORD STIMULATOR BATTERY EXCHANGE N/A 10/11/2021   Procedure: SPINAL CORD STIMULATOR BATTERY EXCHANGE;  Surgeon: Venita Lick, MD;  Location: [facility];  Service: Orthopedics;  Laterality: N/A;  60 mins   SPINAL CORD STIMULATOR INSERTION N/A 11/21/2016   Procedure: LUMBAR SPINAL CORD STIMULATOR INSERTION;  Surgeon: Venita Lick, MD;  Location:  [facility] OR;  Service: Orthopedics;  Laterality: N/A;  Requests 2.5 hrs   TONSILLECTOMY     1953   UPPER GI ENDOSCOPY  [id number]   VASECTOMY     1980   Patient Active Problem List   Diagnosis Date Noted   Attention or concentration deficit [R41.840] 05/30/2023   MCI (mild cognitive impairment) 05/30/2023   Generalized anxiety disorder    Acquired hallux rigidus, right 03/25/2023   OSA (obstructive sleep apnea) 11/12/2022   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   Peripheral vascular disease 12/25/2021   Dizziness 12/24/2021   Leg pain, left 11/14/2021   Chronic low back pain with sciatica 11/14/2021   Spinal cord stimulator status 11/14/2021   Muscle spasm 11/14/2021   Slow transit constipation 09/11/2021   Early satiety 09/11/2021   Hoarseness 09/11/2021   Iron deficiency 09/11/2021   History of colonic polyps 09/11/2021   Weight decreased 09/11/2021   Esophageal motility disorder 09/11/2021   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   Pain in both feet 08/10/2021   Atherosclerosis of aorta 08/30/2020   Mild concentric left ventricular hypertrophy (LVH)    Hypogonadism male    History of kidney stones    Erectile dysfunction    COPD (chronic obstructive pulmonary disease)    Acquired trigger finger of left middle finger 04/22/2019   Acquired trigger finger of right middle finger 04/22/2019   Left ventricular dysfunction 10/12/2018   Ankylosing spondylitis 10/09/2018   Presbycusis of both ears 04/23/2018   Chronic pain 11/21/2016   Arthritis 10/15/2016   Spondylolisthesis of lumbar region 02/28/2015   Hyperlipidemia    Lumbar disc disease    Hypertension    CAD (coronary artery disease) 05/19/2012   GERD (gastroesophageal reflux disease)    Major depressive disorder    Chronic asthma 05/17/2011   Allergic rhinitis due to pollen 08/08/2010   History of benign positional vertigo    ADHD, predominantly hyperactive type     ONSET DATE: 08/19/2023 - referral date  REFERRING DIAG: R49.0  (ICD-10-CM) - Dysphonia  THERAPY DIAG:  Dysarthria and anarthria  Cognitive communication deficit  Rationale for Evaluation and Treatment: Rehabilitation  SUBJECTIVE:   SUBJECTIVE STATEMENT: "I think I'm taking pretty well - she needs a hearing aid" Pt accompanied by: significant other [name]  PERTINENT HISTORY: Dr. Arbutus Leas has previously theorized a vascular parkinsonian presentation being more likely than idiopathic Parkinson's disease. [name] is known to [place] from prior course of ST 11/29/22-02/04/23.   Neuropsych eval Nov 2024: [name]' pattern of performance is suggestive of impairment surrounding executive functioning. Performance variability was further exhibited across processing speed, complex attention, semantic fluency, and both encoding (i.e., learning) and delayed retrieval aspects of memory.  MCI             -suspect due to meds             -Neurocognitive testing with Dr. Milbert Coulter confirmed MCI in November, 2024.  He mentioned medications as a role in diminishing cognition.  PAIN:  Are you having pain? No  PATIENT REPORTED OUTCOME MEASURES (PROM): - Total 18 The Communicative Participation Item Bank        Does your condition interfere with... Pt Rating   ...talking with people you know 2   ...communicating when you need to say something quickly 3   ...talking with people you do not know 2   ...communicating when you are out in your community 1   ...asking questions in a conversation 2   ....communicating in a small group of people 2   ...having a long conversation 2   ...giving detailed infomrmation 3   ...getting your turn in a fast moving conversation 1   ...trying to persuade a friend or family member to see a different point of view 2  3= Not at all; 2=A little; 1=Quite a bit; 0=Very much                                                                                                                             TREATMENT DATE:    09/10/23: [name] completed  online home practice session on [employer] website. He reports his wife signed up for [employer] as well.  Targeted volume and intelligibility using Speak Out! Lesson E [employer] and [employer]. Pt required usual min verbal cues, modeling, for volume and breath support.   Pt averages the following volume levels:  Sustained AH: 85 - cues required to keep volume and pitch steady and consistent  Counting:85  Reading (phrases): 80  Cognitive Exercise: 76 with usual mod verbal cues and gesture cues Required frequent mod gesture and verbal cues, modeling for carryover of intent /volume when conversation was interjected into Speak Out! Lesson -   09/08/23: They have not signed up for e Library on [employer] ([employer]) website - Instructed them to do this - submitted [name]'s email to [employer] for access to [employer]. They both watched the required webinar on [employer] website. Targeted volume and intelligibility using Speak Out! Lesson April Week 1 on [employer] e Libary. Pt required occasional min  verbal cues, modeling, for volume and breath support. Pt averages the following volume levels:  Sustained AH: 85  Counting:80dB with occasional min cues for smooth and connected counting Pitch glides required usual mod modeling, gesture cues to achieve pitch change and prolong glides  Reading (phrases): 75  Cognitive Exercise: 72dB Required usual lmod verbal cues, modeling for carryover of intent /volume answering simple questions following structured practice.  Completed CPIB PROM - See above    09/01/23 (eval day): Eval completed. Initiated training in compensatory strategies for slow processing - see Patient Instructions. Initiated Speak  Out! Thyra Breed one to review and instruct on HEP - With occasional min  A, [name] achieved the dB targets for warm ups, sustained Ah and counting. He required usual mod A to maintain volume on glides and conversation exercises. Demonstrated the Parkinson [employer] website and  instructed [name] and [name] to watch the What is Parkinsons and Living with Parkinsons webinar. Introduced the on line home practice sessions as well. In conversation, [name] responded well to frequent mod verbal cues and visual cues to carryover intentional speech to average 70dB. Provided a log to track completion of HEP     PATIENT EDUCATION: Education details: See Treatment note, See Patient Instructions, compensations for cognitive impairments, HEP for dysarthria Person educated: Patient and Spouse Education method: Explanation, Demonstration, Verbal cues, and Handouts Education comprehension: verbalized understanding, returned demonstration, verbal cues required, and needs further education  HOME EXERCISE PROGRAM: Speak Out! Therapy Program to be completed twice daily    GOALS: Goals reviewed with patient? Yes  SHORT TERM GOALS: Target date: 09/29/23  Pt will complete HEP for dysarthria with occasional min A Baseline: Goal status: ONGOING  2.  Pt will average 72dB 18/20 sentences Baseline:  Goal status: ONGOING  3.  Pt will complete HEP twice a week 5/7 days outside of ST Baseline:  Goal status: ONGOING  4.  Pt will carryover 2 compensatory strategies to support processing speed in conversation Baseline:  Goal status:ONGOING  5.  Pt will average 72dB over 8 minute conversation with occasional min A Baseline:  Goal status: ONGOING   LONG TERM GOALS: Target date: 10/27/23  Pt will complete HEP for dysarthria with mod I Baseline:  Goal status:ONGOING  2.  Pt will ID and correct hoarse low volume with supervision cues Baseline:  Goal status: ONGOING  3.  Pt will average 72dB over 15 minute conversation Baseline:  Goal status: ONGOING  4.  Pt will participate in 2 on line home practice sessions on the Parkinson [employer] website Baseline:  Goal status: ONGOING  5.  Pt will verbalize a plan/schedule to continue daily Speak Out! Practice after d/c from  ST Baseline:  Goal status:ONGOING  6.  Pt will improve score on Communication Participation Item Bank Baseline: 18 Goal status: ONGOING  ASSESSMENT:  CLINICAL IMPRESSION: Patient is a 78 y.o. 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 His spouse, [name] reports difficulty hearing [name] at a distance. [name] endorses low volume and hoarse voice. He also endorses slow processing and difficulty getting a turn in fast conversations and group conversations. He named 15 animals and 10 "m" words in 1 minute. [name] reports episodes where his throat "closes up" and "nothing will come out" in particular when signing. Of note, on the divergent naming task, [name] twice had to be re-directed to task due to tangential conversation indicating possible attention impairment.(Likely prior ADHD per neuropsych). [name] was easily stimulable for higher intensity voice which eliminate hoarse quality and improved intelligibility. I recommend skilled ST to maximize intelligibility for safety, independence and to reduce caregiver burden..   OBJECTIVE IMPAIRMENTS: Objective impairments include attention, memory, executive functioning, expressive language, and dysarthria. These impairments are limiting patient from effectively communicating at home and in community.Factors affecting potential to achieve goals and functional outcome are medical prognosis.. Patient will benefit from skilled SLP services to address above impairments and improve overall function.  REHAB POTENTIAL: Good  PLAN:  SLP FREQUENCY: 2x/week  SLP DURATION: 8 weeks  PLANNED INTERVENTIONS: Aspiration precaution training, Diet toleration management , Language facilitation, Environmental controls, Cueing hierachy, Cognitive reorganization, Internal/external aids, Functional tasks, Multimodal communication approach, SLP instruction and  feedback, Compensatory strategies, Patient/family education, and [address] Treatment of speech (30 or 45 min)     Mikella Linsley, Radene Journey, CCC-SLP 09/10/2023, 11:25 AM

## 2023-09-10 NOTE — Therapy (Signed)
 OUTPATIENT OCCUPATIONAL THERAPY [facility]  Patient Name: [name] MRN: [id number] DOB:1946-01-16, 78 y.o., male Today's Date: 09/10/2023  PCP: Ronnald Nian, MD REFERRING PROVIDER: Vladimir Faster, DO  END OF SESSION:  OT End of Session - 09/10/23 1104     Visit Number 2    Number of Visits 16    Date for OT Re-Evaluation 11/01/23    Authorization Type [employer] MCR - Auth required    Progress Note Due on Visit 10    OT Start Time 1105    OT Stop Time 1148    OT Time Calculation (min) 43 min    Activity Tolerance Patient tolerated treatment well    Behavior During Therapy WFL for tasks assessed/performed             Past Medical History:  Diagnosis Date   Acquired hallux rigidus, right 03/25/2023   Acquired trigger finger of left middle finger 04/22/2019   Acquired trigger finger of right middle finger 04/22/2019   ADHD, predominantly hyperactive type    Allergic rhinitis due to pollen 08/08/2010   Allergy vaccine 1:50,000 03/25/08; 1:10 03/27/09 > 1:2 GH >> DC'd 01/04/2015 for observation        Anemia    takes Fe -    Anginal pain    Ankylosing spondylitis 10/09/2018   Arthritis    Atherosclerosis of aorta 08/30/2020   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 Donnie Aho   Chronic asthma 05/17/2011   Chronic low back pain with sciatica 11/14/2021   Chronic pain 11/21/2016   COPD (chronic obstructive pulmonary disease)    Dizziness 12/24/2021   Early satiety 09/11/2021   Erectile dysfunction    Esophageal motility disorder 09/11/2021   Generalized anxiety disorder    GERD (gastroesophageal reflux disease)    History of benign positional vertigo    History of colonic polyps 09/11/2021   History of kidney stones    Hoarseness 09/11/2021   Hyperlipidemia    Hypertension    Hypogonadism male    prior use on Testim   Insomnia    Iron deficiency 09/11/2021   Left ventricular dysfunction 10/12/2018   Leg pain, left  11/14/2021   Lumbar disc disease    Major depressive disorder    Mild concentric left ventricular hypertrophy (LVH)    Muscle spasm 11/14/2021   Old inferior myocardial infarction 1995   OSA (obstructive sleep apnea) 11/12/2022   NPSG 10/21/22- AHI 28.7/ hr, desat to 89%/Mean 93%, body weight 165 lbs     Pain in both feet 08/10/2021   Peripheral vascular disease 12/25/2021   Pneumonia    Presbycusis of both ears 04/23/2018   Shortness of breath dyspnea    Slow transit constipation 09/11/2021   Spinal cord stimulator status 11/14/2021   Spondylolisthesis of lumbar region 02/28/2015   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 Donnie Aho   CHEILECTOMY  10/16/2011   Procedure: CHEILECTOMY;  Surgeon: Sherri Rad, MD;  Location: [facility];  Service: Orthopedics;  Laterality: Right;   CHOLECYSTECTOMY     COLONOSCOPY     CORONARY ANGIOPLASTY     1995 after MI   CORONARY ANGIOPLASTY WITH  STENT PLACEMENT     1999    ESOPHAGOGASTRODUODENOSCOPY  02/18/2012   Procedure: ESOPHAGOGASTRODUODENOSCOPY (EGD);  Surgeon: Florencia Reasons, MD;  Location: [facility];  Service: Endoscopy;  Laterality: N/A;   FOOT SURGERY     joint scraped, arthritis right foot   HERNIA REPAIR     LUMBAR DISC SURGERY     x2   right index finger  mass removed- arthritis     Dr. Amanda Pea   [facility]  Right 05/18/2021   medial toe consistent with callus   SKIN BIOPSY Left 05/17/2021   actinic keratosis   SPINAL CORD STIMULATOR BATTERY EXCHANGE N/A 10/11/2021   Procedure: SPINAL CORD STIMULATOR BATTERY EXCHANGE;  Surgeon: Venita Lick, MD;  Location: [facility];  Service: Orthopedics;  Laterality: N/A;  60 mins   SPINAL CORD STIMULATOR INSERTION N/A  11/21/2016   Procedure: LUMBAR SPINAL CORD STIMULATOR INSERTION;  Surgeon: Venita Lick, MD;  Location: [facility];  Service: Orthopedics;  Laterality: N/A;  Requests 2.5 hrs   TONSILLECTOMY     1953   UPPER GI ENDOSCOPY  [id number]   VASECTOMY     1980   Patient Active Problem List   Diagnosis Date Noted   Attention or concentration deficit [R41.840] 05/30/2023   MCI (mild cognitive impairment) 05/30/2023   Generalized anxiety disorder    Acquired hallux rigidus, right 03/25/2023   OSA (obstructive sleep apnea) 11/12/2022   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   Peripheral vascular disease 12/25/2021   Dizziness 12/24/2021   Leg pain, left 11/14/2021   Chronic low back pain with sciatica 11/14/2021   Spinal cord stimulator status 11/14/2021   Muscle spasm 11/14/2021   Slow transit constipation 09/11/2021   Early satiety 09/11/2021   Hoarseness 09/11/2021   Iron deficiency 09/11/2021   History of colonic polyps 09/11/2021   Weight decreased 09/11/2021   Esophageal motility disorder 09/11/2021   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   Pain in both feet 08/10/2021   Atherosclerosis of aorta 08/30/2020   Mild concentric left ventricular hypertrophy (LVH)    Hypogonadism male    History of kidney stones    Erectile dysfunction    COPD (chronic obstructive pulmonary disease)    Acquired trigger finger of left middle finger 04/22/2019   Acquired trigger finger of right middle finger 04/22/2019   Left ventricular dysfunction 10/12/2018   Ankylosing spondylitis 10/09/2018   Presbycusis of both ears 04/23/2018   Chronic pain 11/21/2016   Arthritis 10/15/2016   Spondylolisthesis of lumbar region 02/28/2015   Hyperlipidemia    Lumbar disc disease    Hypertension    CAD (coronary artery disease) 05/19/2012   GERD (gastroesophageal reflux disease)    Major depressive disorder    Chronic asthma 05/17/2011   Allergic rhinitis due to pollen 08/08/2010   History of benign positional vertigo    ADHD,  predominantly hyperactive type     ONSET DATE: 08/19/2023 (referral date - based off screen recommendations)  REFERRING DIAG: Parkinson's Disease R41.3 (ICD-10-CM) - Memory impairment  THERAPY DIAG:  Other lack of coordination  Unsteadiness on feet  Abnormal posture  Muscle weakness (generalized)  Rationale for Evaluation and Treatment: Rehabilitation  SUBJECTIVE:   SUBJECTIVE STATEMENT:  I fell 2 nights ago in the doorway   I get distracted easily. I have had dizziness for years.  Pt accompanied by: self  PERTINENT HISTORY: PD (diagnosed Spring 2024), ADHD, CAD, COPD, HTN, HLD, Dizziness, MI  PRECAUTIONS: Fall and Other: no driving, spinal nerve stimulator  WEIGHT BEARING RESTRICTIONS: No  PAIN:  Are you having pain? No  FALLS: Has patient fallen in last 6 months? 32fall  LIVING ENVIRONMENT: Lives with: lives with their spouse Gigi Gin") and 3 cats Lives in:1 story home, 3-4 steps to enter (ramp on back). 4 steps down to washer/dryer but wife does Has following equipment at home: Dan Humphreys - 2 wheeled  PLOF: Independent with basic ADLs, retired  PATIENT GOALS: get better  OBJECTIVE:  Note: Objective measures were completed at Evaluation unless otherwise noted.  HAND DOMINANCE: Right  ADLs: Overall ADLs: slower Transfers/ambulation related to ADLs: no device in house Eating: independent Grooming: independent UB Dressing: slower with jacket, difficulty and occasional assist with buttons LB Dressing: slower, assist for suspenders Toileting: independent Bathing: mod I - pt has grab bars Tub Shower transfers: walk in shower, but currently using tub/shower combo Equipment: Grab bars  IADLs: Shopping: pt accompanies wife occasionally Light housekeeping: pt washes dishes Meal Prep: pt cooks some [employer] mobility: relies on wife for transportation. Has not driven in a year since diagnosis Medication management: pt/wife loads pillbox together, pt  sometimes forgets to take meds Financial management: wife does Handwriting: 100% legible and Moderate micrographia  MOBILITY STATUS:  uses walker outside of home, mostly independent in home  POSTURE COMMENTS:  rounded shoulders and forward head   FUNCTIONAL OUTCOME MEASURES: (from screen on 08/19/23)  Fastening/unfastening 3 buttons: 50 sec Physical performance test: PPT#2 (simulated eating) 14 sec & PPT#4 (donning/doffing jacket): 22 sec  COORDINATION: (from screen on 08/19/23)  9 Hole Peg test: Right: 39 sec; Left: 40 sec Box and Blocks:  Right 37 blocks, Left 45 blocks Tremors: Resting and mild  UE ROM:  WFL   SENSATION: WFL  MUSCLE TONE: not tested  COGNITION: Overall cognitive status: Impaired and decreased memory . Pt also has ADHD (premorbid) and reports he's easily distracted.   OBSERVATIONS: Bradykinesia and Hypokinesia, stooped posture, shuffling feet, arthritis bilateral hands w/ MP ulnar drift                                                                                                                    TREATMENT DATE: 09/10/23  Pt issued PWR! Hands, finger walking ex (for MP ulnar drift) and coordination HEP - see pt instructions for details. Extensively reviewed with patient. Pt required redirection at beginning of session d/t easily distracted  Pt also encouraged to practice playing piano  PATIENT EDUCATION: Education details: see above Person educated: Patient Education method: Explanation, Demonstration, Tactile cues, Verbal cues, and Handouts Education comprehension: verbalized understanding, returned demonstration, verbal cues required, tactile cues required, and needs further education  HOME EXERCISE PROGRAM: 09/10/23: Albin Felling! Hands, MP finger walking ex, coordination HEP  GOALS: Goals reviewed with patient? Yes  SHORT TERM GOALS: Target date: 10/02/23  Independent with PD specific HEP (including finger walking ex to counteract MP ulnar drift)     Baseline: Goal status: IN PROGRESS  2.  Pt will verbalize understanding of ways to keep thinking skills sharp and ways to compensate  for STM changes for medication management Baseline:  Goal status: INITIAL  3.  Pt will write a sentence with no significant decrease in size and maintain 100% legibility  Baseline:  Goal status: INITIAL  4.  Pt will demonstrate increased ease with dressing as evidenced by decreasing PPT#4 (don/ doff jacket) to 18 secs or less  Baseline: 22 sec Goal status: INITIAL  5.  Pt will verbalize understanding of adaptive strategies to increase ease with ADLS/IADLS   Baseline:  Goal status: INITIAL  6.  Pt to improve RUE as evidenced by increasing Box & Blocks by 4 or more Baseline: 37 Goal status: INITIAL  LONG TERM GOALS: Target date: 11/01/23  Pt will verbalize understanding of ways to prevent future PD related complications and appropriate community resources prn  Baseline:  Goal status: INITIAL  2.  Pt will demonstrate improved ease with fastening buttons as evidenced by decreasing 3 button/unbutton time by 8 seconds or more Baseline: 50 sec Goal status: INITIAL  3.  Pt will demonstrate improved fine motor coordination for ADLs as evidenced by decreasing 9 hole peg test score for each hand and by 5 secs  Baseline: Rt = 39 sec, Lt = 40 sec.  Goal status: INITIAL  4.  Pt will write a paragraph with no significant decrease in size and maintain 100% legibility  Baseline:  Goal status: INITIAL  5.  Pt will consistently don suspenders I'ly Baseline: needs assist Goal status: INITIAL  ASSESSMENT:  CLINICAL IMPRESSION: Patient was seen today for occupational therapy treatment for PD. Initiation of HEP for hands today w/ extensive review/cueing needed. Patient currently presents below baseline level of functioning demonstrating functional deficits and impairments as noted below. Pt would benefit from continued skilled OT services in the outpatient  setting to work on impairments as noted below, develop PD specific HEP, and prevent future related PD complications. [name]   PERFORMANCE DEFICITS: in functional skills including ADLs, IADLs, coordination, dexterity, proprioception, strength, Fine motor control, Gross motor control, mobility, balance, body mechanics, decreased knowledge of precautions, decreased knowledge of use of DME, vision, and UE functional use, cognitive skills including attention, memory, problem solving, and safety awareness, and psychosocial skills including coping strategies.   IMPAIRMENTS: are limiting patient from ADLs, IADLs, leisure, and social participation.   COMORBIDITIES:  may have co-morbidities  that affects occupational performance. Patient will benefit from skilled OT to address above impairments and improve overall function.  MODIFICATION OR ASSISTANCE TO COMPLETE EVALUATION: No modification of tasks or assist necessary to complete an evaluation.  OT OCCUPATIONAL PROFILE AND HISTORY: Detailed assessment: Review of records and additional review of physical, cognitive, psychosocial history related to current functional performance.  CLINICAL DECISION MAKING: Moderate - several treatment options, min-mod task modification necessary  REHAB POTENTIAL: Good  EVALUATION COMPLEXITY: Moderate    PLAN:  OT FREQUENCY: 2x/week  OT DURATION: 8 weeks  PLANNED INTERVENTIONS: 97535 self care/ADL training, 16109 therapeutic exercise, 97530 therapeutic activity, 97112 neuromuscular re-education, 97140 manual therapy, 97018 paraffin, 60454 fluidotherapy, 97010 moist heat, 97034 contrast bath, 97760 Orthotics management and training, 09811 Splinting (initial encounter), [id number] Subsequent splinting/medication, passive range of motion, functional mobility training, visual/perceptual remediation/compensation, energy conservation, coping strategies training, patient/family education, and DME and/or AE instructions  RECOMMENDED  OTHER SERVICES: none at this time  CONSULTED AND AGREED WITH PLAN OF CARE: Patient and family member/caregiver  PLAN FOR NEXT SESSION: ADL strategies - practice handwriting, jacket, buttons, suspenders   Sheran Lawless, OT 09/10/2023, 11:06 AM

## 2023-09-10 NOTE — Patient Instructions (Addendum)
 PWR! Hand Exercises Perform each exercise at least 10 repetitions 1x/day, and PWR! PUSH throughout the day when you are having trouble using your hands (picking up small objects, writing, eating, typing, buttoning, etc.). ** Make each movement big and deliberate; feel the movement.  PWR! UP: Fists to open fingers BIG  PWR! Rock:  Move wrists up and down [employer]! Twist: Twist palms up and down BIG  PWR! Step: Step your thumb to index finger while keeping other fingers straight. Flick fingers out BIG (thumb out/straighten fingers). Repeat with other fingers.  PWR! PUSH: Push hands out BIG. Elbows straight, wrists up, fingers open and spread apart BIG.          Radial Finger Walk (Active to Counteract Ulnar Deviation    With palm flat on table and held steady, lift or slide fingers one by one toward thumb. Hold fingers in position and lift entire hand up from table to reposition for next repetition. NEVER go towards little finger.  Repeat _10___ times. Do __3__ sessions per day.    Coordination Exercises  Perform the following exercises for 15 minutes 1 times per day. Perform with both hand(s). Perform using big movements.  Flipping Cards: Place deck of cards on the table. Flip cards over by opening your hand big to grasp and then turn your palm up big. Deal cards: Hold 1/2 or whole deck in your hand. Use thumb to push card off top of deck with one big push. Toss bean bag in the air and catch with the same hand: Toss big/high from palm, not fingers. Pick up 5 coins one at a time and and place in a container/coin bank. Open hand big before picking up Practice writing: Slow down, write big, and focus on forming each letter. Perform "PWR Push"/hand stretches: Close hands then flick out your fingers with focus on opening hands, pulling wrists back, and extending elbows like you are pushing.

## 2023-09-11 ENCOUNTER — Ambulatory Visit: Payer: Self-pay | Admitting: Psychology

## 2023-09-11 ENCOUNTER — Ambulatory Visit (INDEPENDENT_AMBULATORY_CARE_PROVIDER_SITE_OTHER): Admitting: Psychology

## 2023-09-11 DIAGNOSIS — R41840 Attention and concentration deficit: Secondary | ICD-10-CM

## 2023-09-11 DIAGNOSIS — G3184 Mild cognitive impairment, so stated: Secondary | ICD-10-CM

## 2023-09-11 DIAGNOSIS — R4189 Other symptoms and signs involving cognitive functions and awareness: Secondary | ICD-10-CM

## 2023-09-11 NOTE — Progress Notes (Signed)
   Psychometrician Note   Cognitive testing was administered to [name] by Wallace Keller, B.S. (psychometrist) under the supervision of Dr. Annice Pih, Psy.D., licensed psychologist on 09/11/2023. Mr. Savage did not appear overtly distressed by the testing session per behavioral observation or responses across self-report questionnaires. Rest breaks were offered.    The battery of tests administered was selected by Dr. Annice Pih, Psy.D. with consideration to Mr. Hinners current level of functioning, the nature of his symptoms, emotional and behavioral responses during interview, level of literacy, observed level of motivation/effort, and the nature of the referral question. This battery was communicated to the psychometrist. Communication between Dr. Annice Pih, Psy.D. and the psychometrist was ongoing throughout the evaluation and Dr. Annice Pih, Psy.D. was immediately accessible at all times. Dr. Annice Pih, Psy.D. provided supervision to the psychometrist on the date of this service to the extent necessary to assure the quality of all services provided.    [name] will return within approximately 1-2 weeks for an interactive feedback session with Dr. Robbie Lis at which time his test performances, clinical impressions, and treatment recommendations will be reviewed in detail. Mr. Reichardt understands he can contact our office should he require our assistance before this time.  A total of 130 minutes of billable time were spent face-to-face with Mr. Moen by the psychometrist. This includes both test administration and scoring time. Billing for these services is reflected in the clinical report generated by Dr. Annice Pih, Psy.D.  This note reflects time spent with the psychometrician and does not include test scores or any clinical interpretations made by Dr. Robbie Lis. The full report will follow in a separate note.

## 2023-09-11 NOTE — Progress Notes (Unsigned)
 NEUROPSYCHOLOGICAL EVALUATION [place]. [facility]  [place] Department of Neurology  Date of Evaluation: 09/11/2023  REASON FOR REFERRAL   [name] is a*** ***-year-old, {KDEISSHANDEDNESS:32240}-handed, *** male with *** years of formal education. He was referred for neuropsychological evaluation by his neurologist, {KDEISSPROVIDERS1:32312}, to assess current neurocognitive functioning, document potential cognitive deficits, and assist with treatment planning. This is his first neuropsychological evaluation.  He previously underwent neuropsychological evaluation on *** at ***. {KDEISSPREVIOUSNP:32242}  SUMMARY OF RESULTS   ***  DIAGNOSTIC IMPRESSION   Results of the current evaluation ***  ICD-10 Codes: ***  RECOMMENDATIONS   A repeat neuropsychological evaluation in *** months (or sooner if functional decline is noted) is recommended.  ***In consultation with your doctor, schedule cognitive reevaluation on an as-needed basis to assess for cognitive decline and update treatment recommendations. Reevaluation should occur during a period of medical and affective stability.  ***Discuss with your neurologist the risks and benefits of starting a medication that can help slow memory decline.  ***Patient has already been prescribed a medication (i.e., ***) aimed at addressing memory loss and concerns surrounding Alzheimer's disease. He is encouraged to continue taking this medication as prescribed. It is important to highlight that this medication has been shown to slow functional decline in some individuals.  ***Consider additional brain imaging. FDG-PET scan may be helpful to characterize regional hypometabolism suggestive of underlying  pathology.  {KDEISSDRIVINGRECS:32201}  {KDEISSDEMENTIARECS:32187}  {KDEISSHEALTHYLIVINGRECS:32196}  {KDEISSPSYCHRECS:32202}  {KDEISSREFERRALRECS:32193}  {KDEISSSLEEPRECS:32198}  {KDEISSSUBSTANCERECS:32199}  {KDEISSSENSORYRECS:32189}  {KDEISSACPRECS (Optional):32200}  {KDEISSCOGRECS:32216}  {KDEISSCAREGIVINGRECS:32194}  {KdeissRecommendations:32183}  DISPOSITION   Patient will follow up with the referring provider, {KDEISSNEUROLOGISTS:32243}. {KDEISSFOLLOWUP:32247}. He and *** will be provided verbal feedback in approximately one week regarding the findings and impression during this visit.  The remainder of the report includes the details of the patient's background and a table of results from the current evaluation, which support the summary and recommendations described above.  BACKGROUND   History of Presenting Illness: The following information was obtained from a review of medical records and an interview with the patient and ***. ***  Previous Neuropsychological Evaluation(s): ***  Cognitive Functioning: During today's appointment, the patient reported ***.  Physical Functioning: Patient denied difficulties with sleep initiation and maintenance. Appetite is stable. No changes to sense of taste or smell were reported. Vision and hearing are stable. ***RBD, falls, tremor, speech, incontinence, pain***  Emotional Functioning: Patient reported his current mood as "***." ***  Imaging: MRI of the brain (***) documented ***.  Other Relevant Medical History: Remarkable for ***. No history of stroke, CNS infection, head injury, or seizure was reported.***  Current Medications: Per patient, ***.  The patient denied taking medications.*** At this time, he reports only taking vitamins and supplements.***  Functional Status: Patient independently performs all ADLs and IADLs, including driving, without difficulty.***  Family Neurological History: Remarkable for  ***  Psychiatric History: Remarkable for ***.  History of depression, anxiety, prior mental health treatment, suicidal ideation, hallucinations, and psychiatric hospitalizations was not reported.  Substance Use History: {KDEISSSUBSTANCES:32250}  Social and Developmental History: Patient was born in ***. History of perinatal complications and developmental delays was not reported. Language ***. Patient is *** and has *** children who live locally. Patient resides in ***.  Educational and Occupational History: No history of childhood learning disability, special education services, or grade retention was reported. Patient completed *** or obtained a *** degree. Patient was employed as ***. Patient retired in [employer]   BEHAVIORAL OBSERVATIONS   Patient arrived {  KDEISSBO1:32254} and was accompanied by ***. He ambulated {KDEISSBO2:32255}. He was alert and {KDEISSBO3:32256}. He was appropriately groomed and dressed for the setting. No significant sensory or motor abnormalities*** were observed. Vision and hearing *** were adequate for testing purposes. {KDEISSBO6:32259}. No conversational word-finding difficulties, paraphasic errors, or dysarthria were observed. Comprehension was conversationally intact. Thought processes were {KDEISSBO4:32257}. Thought content was organized and devoid of delusions. Insight appeared {KDEISSBO5:32258}. Affect was *** and congruent with {KDEISSBO7:32260} mood. He was cooperative and gave adequate effort during testing, including on standalone and embedded measures of performance validity***. Results are thought to accurately reflect his cognitive functioning at this time.  NEUROPSYCHOLOGICAL TESTING RESULTS   Tests Administered: {KDEISSNPTESTS:32266}  Test results are provided in the table below. Whenever possible, the patient's scores were compared against age-, sex-, and education-corrected normative samples. Interpretive descriptions are based on the AACN consensus  conference statement on uniform labeling (Guilmette et al., 2020).  ***  INFORMED CONSENT   Patient was provided with a verbal description of the nature and purpose of the neuropsychological evaluation. Also reviewed were the foreseeable risks and/or discomforts and benefits of the procedure, limits of confidentiality, and mandatory reporting requirements of this provider. Patient was given the opportunity to have their questions answered. Oral consent to participate was provided by the patient.   This report was prepared as part of a clinical evaluation and is not intended for forensic use.  SERVICE   This evaluation was conducted by Annice Pih, Psy.D. In addition to time spent directly with the patient, total professional time includes record review, integration of relevant medical history, test selection, interpretation of findings, and report preparation. A technician, {KDEISSTECHS:32253}, provided testing and scoring assistance for *** minutes.  Psychiatric Diagnostic Evaluation Services (Professional): 16109 x 1 Neuropsychological Testing Evaluation Services (Professional): 60454 x 1 Neuropsychological Testing Evaluation Services (Professional): 09811 x *** Neuropsychological Test Administration and Scoring (Technician): [phone] x 1 Neuropsychological Test Administration and Scoring (Technician): [phone] x ***  This report was generated using voice recognition software. While this document has been carefully reviewed, transcription errors may be present. I apologize in advance for any inconvenience. Please contact me if further clarification is needed.            Annice Pih, Psy.D.             Neuropsychologist

## 2023-09-12 ENCOUNTER — Other Ambulatory Visit: Payer: Self-pay | Admitting: Family Medicine

## 2023-09-12 DIAGNOSIS — F418 Other specified anxiety disorders: Secondary | ICD-10-CM

## 2023-09-15 NOTE — Telephone Encounter (Signed)
 Last apt 01/01/23

## 2023-09-15 NOTE — Therapy (Unsigned)
 OUTPATIENT SPEECH LANGUAGE PATHOLOGY PARKINSON'S TREATMENT   Patient Name: [name] MRN: [id number] DOB:Mar 26, 1946, 78 y.o., male Today's Date: 09/16/2023  PCP: Ronnald Nian, MD REFERRING PROVIDER: Vladimir Faster, DO  END OF SESSION:  End of Session - 09/16/23 1021     Visit Number 4    Number of Visits 17    Date for SLP Re-Evaluation 10/27/23    Authorization Type [employer] medicare    SLP Start Time 0932    SLP Stop Time  1015    SLP Time Calculation (min) 43 min    Activity Tolerance Patient tolerated treatment well              Past Medical History:  Diagnosis Date   Acquired hallux rigidus, right 03/25/2023   Acquired trigger finger of left middle finger 04/22/2019   Acquired trigger finger of right middle finger 04/22/2019   ADHD, predominantly hyperactive type    Allergic rhinitis due to pollen 08/08/2010   Allergy vaccine 1:50,000 03/25/08; 1:10 03/27/09 > 1:2 GH >> DC'd 01/04/2015 for observation        Anemia    takes Fe -    Anginal pain    Ankylosing spondylitis 10/09/2018   Arthritis    Atherosclerosis of aorta 08/30/2020   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 Donnie Aho   Chronic asthma 05/17/2011   Chronic low back pain with sciatica 11/14/2021   Chronic pain 11/21/2016   COPD (chronic obstructive pulmonary disease)    Dizziness 12/24/2021   Early satiety 09/11/2021   Erectile dysfunction    Esophageal motility disorder 09/11/2021   Generalized anxiety disorder    GERD (gastroesophageal reflux disease)    History of benign positional vertigo    History of colonic polyps 09/11/2021   History of kidney stones    Hoarseness 09/11/2021   Hyperlipidemia    Hypertension    Hypogonadism male    prior use on [employer]   Insomnia    Iron deficiency 09/11/2021   Left ventricular dysfunction 10/12/2018   Leg pain, left 11/14/2021   Lumbar disc disease    Major depressive disorder    Mild concentric left  ventricular hypertrophy (LVH)    Muscle spasm 11/14/2021   Old inferior myocardial infarction 1995   OSA (obstructive sleep apnea) 11/12/2022   NPSG 10/21/22- AHI 28.7/ hr, desat to 89%/Mean 93%, body weight 165 lbs     Pain in both feet 08/10/2021   Peripheral vascular disease 12/25/2021   Pneumonia    Presbycusis of both ears 04/23/2018   Shortness of breath dyspnea    Slow transit constipation 09/11/2021   Spinal cord stimulator status 11/14/2021   Spondylolisthesis of lumbar region 02/28/2015   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 Donnie Aho   CHEILECTOMY  10/16/2011   Procedure: CHEILECTOMY;  Surgeon: Sherri Rad, MD;  Location: [facility];  Service: Orthopedics;  Laterality: Right;   CHOLECYSTECTOMY     COLONOSCOPY     CORONARY ANGIOPLASTY     1995 after MI   CORONARY ANGIOPLASTY WITH STENT PLACEMENT     1999    ESOPHAGOGASTRODUODENOSCOPY  02/18/2012   Procedure: ESOPHAGOGASTRODUODENOSCOPY (EGD);  Surgeon: Florencia Reasons, MD;  Location: [facility];  Service: Endoscopy;  Laterality: N/A;   FOOT SURGERY     joint scraped, arthritis right foot   HERNIA REPAIR     LUMBAR DISC SURGERY     x2   right index finger  mass removed- arthritis     Dr. Amanda Pea   [facility]  Right 05/18/2021   medial toe consistent with callus   SKIN BIOPSY Left 05/17/2021   actinic keratosis   SPINAL CORD STIMULATOR BATTERY EXCHANGE N/A 10/11/2021   Procedure: SPINAL CORD STIMULATOR BATTERY EXCHANGE;  Surgeon: Venita Lick, MD;  Location: [facility] OR;  Service: Orthopedics;  Laterality: N/A;  60 mins   SPINAL CORD STIMULATOR INSERTION N/A 11/21/2016   Procedure: LUMBAR SPINAL CORD STIMULATOR INSERTION;  Surgeon: Venita Lick, MD;   Location: [facility] OR;  Service: Orthopedics;  Laterality: N/A;  Requests 2.5 hrs   TONSILLECTOMY     1953   UPPER GI ENDOSCOPY  [id number]   VASECTOMY     1980   Patient Active Problem List   Diagnosis Date Noted   Attention or concentration deficit [R41.840] 05/30/2023   MCI (mild cognitive impairment) 05/30/2023   Generalized anxiety disorder    Acquired hallux rigidus, right 03/25/2023   OSA (obstructive sleep apnea) 11/12/2022   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   Peripheral vascular disease 12/25/2021   Dizziness 12/24/2021   Leg pain, left 11/14/2021   Chronic low back pain with sciatica 11/14/2021   Spinal cord stimulator status 11/14/2021   Muscle spasm 11/14/2021   Slow transit constipation 09/11/2021   Early satiety 09/11/2021   Hoarseness 09/11/2021   Iron deficiency 09/11/2021   History of colonic polyps 09/11/2021   Weight decreased 09/11/2021   Esophageal motility disorder 09/11/2021   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   Pain in both feet 08/10/2021   Atherosclerosis of aorta 08/30/2020   Mild concentric left ventricular hypertrophy (LVH)    Hypogonadism male    History of kidney stones    Erectile dysfunction    COPD (chronic obstructive pulmonary disease)    Acquired trigger finger of left middle finger 04/22/2019   Acquired trigger finger of right middle finger 04/22/2019   Left ventricular dysfunction 10/12/2018   Ankylosing spondylitis 10/09/2018   Presbycusis of both ears 04/23/2018   Chronic pain 11/21/2016   Arthritis 10/15/2016   Spondylolisthesis of lumbar region 02/28/2015   Hyperlipidemia    Lumbar disc disease    Hypertension    CAD (coronary artery disease) 05/19/2012   GERD (gastroesophageal reflux disease)    Major depressive disorder    Chronic asthma 05/17/2011   Allergic rhinitis due to pollen 08/08/2010   History of benign positional vertigo    ADHD, predominantly hyperactive type     ONSET DATE: 08/19/2023 - referral date  REFERRING DIAG:  R49.0 (ICD-10-CM) - Dysphonia  THERAPY DIAG:  Dysarthria and anarthria  Cognitive communication deficit  Rationale for Evaluation and Treatment: Rehabilitation  SUBJECTIVE:   SUBJECTIVE STATEMENT: Endorsed difficulty reducing throat clears Pt accompanied by: significant other [name]  PERTINENT HISTORY: Dr. Arbutus Leas has previously theorized a vascular parkinsonian presentation being more likely than idiopathic Parkinson's disease. Marthann Schiller is known to [place] from prior course of ST 11/29/22-02/04/23.   Neuropsych eval Nov 2024: Mr. Garis' pattern of performance is suggestive of impairment surrounding executive functioning. Performance variability was further exhibited across processing speed, complex attention, semantic fluency, and both encoding (i.e., learning) and delayed retrieval aspects of memory.  MCI             -suspect due to meds             -Neurocognitive testing with Dr. Milbert Coulter confirmed MCI in November, 2024.  He mentioned medications as a role in diminishing cognition.  PAIN:  Are you having pain? No  PATIENT REPORTED OUTCOME MEASURES (PROM): - Total 18 The Communicative Participation Item Bank        Does your condition interfere with... Pt Rating   ...talking with people you know 2   ...communicating when you need to say something quickly 3   ...talking with people you do not know 2   ...communicating when you are out in your community 1   ...asking questions in a conversation 2   ....communicating in a small group of people 2   ...having a long conversation 2   ...giving detailed infomrmation 3   ...getting your turn in a fast moving conversation 1   ...trying to persuade a friend or family member to see a different point of view 2  3= Not at all; 2=A little; 1=Quite a bit; 0=Very much                                                                                                                             TREATMENT DATE:   09/16/23: Exhibited initial throat clear  with awareness of need to reduce habit. Pt able to carryover throat clear alternatives with rare min x4 additional opportunities this date. Recently received new Speak Out! Workbook. Has watched some online lessons with inconsistent completion. Re-educated recommendations for daily practice, in which pt verbalized understanding. Indicated feeling self-conscious completing with wife and Librarian, academic. Assisted with re-framing assistance to aid confidence and carryover versus judgement. Re-educated Lesson #1. Usual model and mod A provided to complete targeted tasks and maintain intent to achieve dB levels.  Pt averages the following volume levels:  Sustained AH: 86   Counting:84  Reading: 82  Cognitive Exercise: 79 with usual mod verbal cues and gesture cues Required frequent mod gestures and verbal cues, modeling for carryover of intent /volume when conversation was interjected during and after Speak Out! Lesson  Of note, pt instructed to bring own water bottle to next ST session to aid hydration.   09/10/23: Mitch completed online home practice session on [employer] website. He reports his wife signed up for [employer] as well.  Targeted volume and intelligibility using Speak Out! Lesson E [employer]. Pt required usual min verbal cues, modeling, for volume and breath support.  Pt averages the following volume levels:  Sustained AH: 85 - cues required to keep volume and pitch steady and consistent  Counting:85  Reading (phrases): 80  Cognitive Exercise: 76 with usual mod verbal cues and gesture cues Required frequent mod gesture and verbal cues, modeling for carryover of intent /volume when conversation was interjected into Speak Out! Lesson  09/08/23: They have not signed up for e Library on [employer] ([employer]) website - Instructed them to do this - submitted [name]'s email to [employer] for access to [employer]. They both watched the required webinar on [employer] website. Targeted  volume and intelligibility using Speak Out! Lesson April Week 1 on [employer] e Libary. Pt required occasional min  verbal cues, modeling, for volume and breath support. Pt averages the following volume levels:  Sustained AH: 85  Counting:80dB with occasional min cues for smooth and connected counting Pitch glides required usual mod modeling, gesture cues to achieve pitch change and prolong glides  Reading (phrases): 75  Cognitive Exercise: 72dB Required usual lmod verbal cues, modeling for carryover of intent /volume answering simple questions following structured practice.  Completed CPIB PROM - See above    09/01/23 (eval day): Eval completed. Initiated training in compensatory strategies for slow processing - see Patient Instructions. Initiated Speak Out! Lesson one to review and instruct on HEP - With occasional min  A, Mitch achieved the dB targets for warm ups, sustained Ah and counting. He required usual mod A to maintain volume on glides and conversation exercises. Demonstrated the Parkinson Voice Project website and instructed Marthann Schiller and Gigi Gin to watch the What is Parkinsons and Living with Parkinsons webinar. Introduced the on line home practice sessions as well. In conversation, Marthann Schiller responded well to frequent mod verbal cues and visual cues to carryover intentional speech to average 70dB. Provided a log to track completion of HEP   PATIENT EDUCATION: Education details: see above Person educated: Patient and Spouse Education method: Explanation, Demonstration, Verbal cues, and Handouts Education comprehension: verbalized understanding, returned demonstration, verbal cues required, and needs further education  HOME EXERCISE PROGRAM: Speak Out! Therapy Program to be completed twice daily    GOALS: Goals reviewed with patient? Yes  SHORT TERM GOALS: Target date: 09/29/23  Pt will complete HEP for dysarthria with occasional min A Baseline: Goal status: ONGOING  2.  Pt will average  72dB 18/20 sentences Baseline:  Goal status: MET  3.  Pt will complete HEP twice a week 5/7 days outside of ST Baseline:  Goal status: ONGOING  4.  Pt will carryover 2 compensatory strategies to support processing speed in conversation Baseline:  Goal status:ONGOING  5.  Pt will average 72dB over 8 minute conversation with occasional min A Baseline:  Goal status: ONGOING   LONG TERM GOALS: Target date: 10/27/23  Pt will complete HEP for dysarthria with mod I Baseline:  Goal status:ONGOING  2.  Pt will ID and correct hoarse low volume with supervision cues Baseline:  Goal status: ONGOING  3.  Pt will average 72dB over 15 minute conversation Baseline:  Goal status: ONGOING  4.  Pt will participate in 2 on line home practice sessions on the Parkinson [employer] website Baseline:  Goal status: ONGOING  5.  Pt will verbalize a plan/schedule to continue daily Speak Out! Practice after d/c from ST Baseline:  Goal status:ONGOING  6.  Pt will improve score on Communication Participation Item Bank Baseline: 18 Goal status: ONGOING  ASSESSMENT:  CLINICAL IMPRESSION: Patient is a 78 y.o. 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 His spouse, Gigi Gin reports difficulty hearing Mitch at a distance. Mitch endorses low volume and hoarse voice. He also endorses slow processing and difficulty getting a turn in fast conversations and group conversations. He named 15 animals  and 10 "m" words in 1 minute. Mitch reports episodes where his throat "closes up" and "nothing will come out" in particular when signing. Of note, on the divergent naming task, Mitch twice had to be re-directed to task due to tangential conversation indicating possible attention impairment.(Likely prior ADHD per neuropsych). Marthann Schiller was easily stimulable for higher intensity voice which eliminate hoarse  quality and improved intelligibility. I recommend skilled ST to maximize intelligibility for safety, independence and to reduce caregiver burden..   OBJECTIVE IMPAIRMENTS: Objective impairments include attention, memory, executive functioning, expressive language, and dysarthria. These impairments are limiting patient from effectively communicating at home and in community.Factors affecting potential to achieve goals and functional outcome are medical prognosis.. Patient will benefit from skilled SLP services to address above impairments and improve overall function.  REHAB POTENTIAL: Good  PLAN:  SLP FREQUENCY: 2x/week  SLP DURATION: 8 weeks  PLANNED INTERVENTIONS: Aspiration precaution training, Diet toleration management , Language facilitation, Environmental controls, Cueing hierachy, Cognitive reorganization, Internal/external aids, Functional tasks, Multimodal communication approach, SLP instruction and feedback, Compensatory strategies, Patient/family education, and 16109 Treatment of speech (30 or 45 min)     Gracy Racer, CCC-SLP 09/16/2023, 10:22 AM

## 2023-09-16 ENCOUNTER — Ambulatory Visit: Admitting: Physical Therapy

## 2023-09-16 ENCOUNTER — Encounter: Payer: Self-pay | Admitting: Physical Therapy

## 2023-09-16 ENCOUNTER — Ambulatory Visit: Admitting: Occupational Therapy

## 2023-09-16 ENCOUNTER — Ambulatory Visit

## 2023-09-16 DIAGNOSIS — R471 Dysarthria and anarthria: Secondary | ICD-10-CM

## 2023-09-16 DIAGNOSIS — R2681 Unsteadiness on feet: Secondary | ICD-10-CM

## 2023-09-16 DIAGNOSIS — M6281 Muscle weakness (generalized): Secondary | ICD-10-CM

## 2023-09-16 DIAGNOSIS — R278 Other lack of coordination: Secondary | ICD-10-CM

## 2023-09-16 DIAGNOSIS — R41842 Visuospatial deficit: Secondary | ICD-10-CM

## 2023-09-16 DIAGNOSIS — R41841 Cognitive communication deficit: Secondary | ICD-10-CM

## 2023-09-16 DIAGNOSIS — R293 Abnormal posture: Secondary | ICD-10-CM

## 2023-09-16 NOTE — Patient Instructions (Signed)
 Tips to reduce freezing episodes with standing or walking:   Don't try to fight the freeze: if you begin taking slower, faster, smaller steps, STOP, get your posture tall, and RESET your posture and balance.  Take a few deep breaths before taking the BIG step to start again. Stand tall with your feet wide, so that you can rock and weight shift through your hips and then take a BIG step to start again   As you approach where your destination with walking, count your steps out loud and/or focus on your target with your eyes until you are fully there. Use appropriate assistive device, as advised by your physical therapist to assist with taking longer, consistent steps.

## 2023-09-16 NOTE — Patient Instructions (Addendum)
 Performing Daily Activities with Big Movements  Pick at least 2 activities a day and perform with BIG, DELIBERATE movements/effort.  Try different activities each day. This can make the activity easier and turn daily activities into exercise to prevent problems in the future!  If you are standing during the activity, make sure to keep feet apart and stand with good/big/posture.  Examples: Dressing  Pull-over shirt:  good posture, bring shirt to head (don't bring head down), deliberately push arms into sleeves Jacket:  stand with feet apart, deliberately push arms into sleeves, against counter Underwear/Pants/Shoes/Socks:  SIT, lean forward, and push each foot in deliberately 1 at a time Open hands to pull down shirt/put on socks/pull up pants--get more material in your hand  Buttoning - Open hands big before fastening each button, deliberate movement (angry buttons--push button through hole), unfasten by using pull-push method   Bathing - Wash/dry with long strokes  Brushing your teeth - Big, slow movements  Cutting food - Long deliberate cuts, put tip of knife down in front of food  Eating - Hold utensil in the middle (not the end), hold fork straight up and down to stab food  Picking up a cup/bottle - Open hand up big and get object all the way in palm  Opening jar/bottle - Move as much as you can with each turn, twist wrist  Putting on seatbelt - Feet apart, twist when reaching across body, look at where you are reaching  Hanging up clothes/getting clothes down from closet - Reach with big effort, open hand, straighten elbow  Putting away groceries/dishes - Reach with big effort  Wiping counter/table - Move in big, long strokes, open hand  Stirring while cooking - Exaggerate movement  Cleaning windows - Feet apart, move in big, long strokes  Sweeping/Raking- Feet apart and one foot in front of the other, move arms in big, long strokes  Vacuuming - Feet apart and one in  front of the other, push with big movement  Folding clothes - Exaggerate arm movements  Washing car - Move in big, long strokes, feet apart  Changing light bulb or Using a screwdriver - Move as much as you can with each turn, twist wrist  Walking into a store/restaurant - Walk with big steps, good posture, swing arms if able  Standing up from a chair/recliner/sofa - Scoot forward, lean forward, and stand with big effort  Picking up something from floor/reaching in low cabinet - Get close to object, position feet apart and one in front of the other     STAND WITH BACK AGAINST KITCHEN COUNTER OR STURDY TABLE TO PUT ON/TAKE OFF JACKET To DON jacket:   1) Wide stance 2) Hold facing tag out with hands big at sleeves 3) Swing around big like a cape to Lt side 4) Punch Lt arm in as you continue to pull behind you with Rt hand  5) Punch Rt arm in   To DOFF jacket:   1) Wide stance 2) Hold hands big at belly button level 3) Turn and twist to Rt to take off same shoulder 4) Turn and twist to Lt to take off same shoulder 5) Reach big behind you and grab cuff of sleeve BIG and yank off one hand, then the other       Suggestions for Handwriting Changes Many people with Parkinson's notice changes in their handwriting.  Handwriting often becomes small and cramped, and can become more difficult to control when writing for longer periods  of time.  This handwriting change is called micrographia.  Why does micrographia occur?  Parkinson's can cause slowing of movement, and feelings of muscle stiffness in the hands and fingers.  Loss of automatic motion also affects the easy, flowing motion of handwriting.  This can impact even simple writing tasks such as signing your name or writing a shopping list.  Attempts to write quickly without thinking about forming each letter contributes to small, cramped handwriting, and may cause the hand to develop a feeling of tightness.  How can I make  writing easier?  Make a deliberate effort to form each letter.  This can be hard to do at first, but is very effective in improving size and legibility of handwriting.  Use a pen grip (round or triangular shaped rubber or foam cylinders available at stationery stores or where writing materials are found) or a larger size pen to keep your hand more relaxed.  Try printing rather than writing in a cursive style. Printing causes you to pause briefly between each letter, keeping writing more legible.   Using lined paper may provide a "visual target" to keep all letters big when writing.   A ballpoint pen typically works better than felt tip or "rolling writer"/gel styles.  Rest your hand if it begins to feel "tight".  Pause briefly when you see your handwriting becoming smaller.  Avoid hurrying or trying to write long passages if you are feeling stressed or fatigued.  Practice helps!  Remind yourself to slow down, aim big, and pause often!  Perform "flicks"/PWR! Hands if your hand feels tight, your writing gets smaller, before you start writing, or if tremors increase.  Involving your team: An occupational therapist can provide assessment and individual recommendations for improvement of your handwriting.  This handout was adapted from [email or web address] [employer]

## 2023-09-16 NOTE — Therapy (Signed)
 OUTPATIENT OCCUPATIONAL THERAPY PARKINSON'S TREATMENT  Patient Name: [name] MRN: [id number] DOB:February 27, 1946, 78 y.o., male Today's Date: 09/16/2023  PCP: Ronnald Nian, MD REFERRING PROVIDER: Vladimir Faster, DO  END OF SESSION:  OT End of Session - 09/16/23 1107     Visit Number 3    Number of Visits 16    Date for OT Re-Evaluation 11/01/23    Authorization Type [employer] MCR - Auth required    Progress Note Due on Visit 10    OT Start Time 1105    OT Stop Time 1145    OT Time Calculation (min) 40 min    Activity Tolerance Patient tolerated treatment well    Behavior During Therapy [facility] for tasks assessed/performed             Past Medical History:  Diagnosis Date   Acquired hallux rigidus, right 03/25/2023   Acquired trigger finger of left middle finger 04/22/2019   Acquired trigger finger of right middle finger 04/22/2019   ADHD, predominantly hyperactive type    Allergic rhinitis due to pollen 08/08/2010   Allergy vaccine 1:50,000 03/25/08; 1:10 03/27/09 > 1:2 [facility] >> DC'd 01/04/2015 for observation        Anemia    takes Fe -    Anginal pain    Ankylosing spondylitis 10/09/2018   Arthritis    Atherosclerosis of aorta 08/30/2020   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 Donnie Aho   Chronic asthma 05/17/2011   Chronic low back pain with sciatica 11/14/2021   Chronic pain 11/21/2016   COPD (chronic obstructive pulmonary disease)    Dizziness 12/24/2021   Early satiety 09/11/2021   Erectile dysfunction    Esophageal motility disorder 09/11/2021   Generalized anxiety disorder    GERD (gastroesophageal reflux disease)    History of benign positional vertigo    History of colonic polyps 09/11/2021   History of kidney stones    Hoarseness 09/11/2021   Hyperlipidemia    Hypertension    Hypogonadism male    prior use on Testim   Insomnia    Iron deficiency 09/11/2021   Left ventricular dysfunction 10/12/2018   Leg pain, left  11/14/2021   Lumbar disc disease    Major depressive disorder    Mild concentric left ventricular hypertrophy (LVH)    Muscle spasm 11/14/2021   Old inferior myocardial infarction 1995   OSA (obstructive sleep apnea) 11/12/2022   NPSG 10/21/22- AHI 28.7/ hr, desat to 89%/Mean 93%, body weight 165 lbs     Pain in both feet 08/10/2021   Peripheral vascular disease 12/25/2021   Pneumonia    Presbycusis of both ears 04/23/2018   Shortness of breath dyspnea    Slow transit constipation 09/11/2021   Spinal cord stimulator status 11/14/2021   Spondylolisthesis of lumbar region 02/28/2015   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 Donnie Aho   CHEILECTOMY  10/16/2011   Procedure: CHEILECTOMY;  Surgeon: Sherri Rad, MD;  Location: [facility];  Service: Orthopedics;  Laterality: Right;   CHOLECYSTECTOMY     COLONOSCOPY     CORONARY ANGIOPLASTY     1995 after MI   CORONARY ANGIOPLASTY WITH  STENT PLACEMENT     1999    ESOPHAGOGASTRODUODENOSCOPY  02/18/2012   Procedure: ESOPHAGOGASTRODUODENOSCOPY (EGD);  Surgeon: Florencia Reasons, MD;  Location: [facility];  Service: Endoscopy;  Laterality: N/A;   FOOT SURGERY     joint scraped, arthritis right foot   HERNIA REPAIR     LUMBAR DISC SURGERY     x2   right index finger  mass removed- arthritis     Dr. Amanda Pea   [facility]  Right 05/18/2021   medial toe consistent with callus   SKIN BIOPSY Left 05/17/2021   actinic keratosis   SPINAL CORD STIMULATOR BATTERY EXCHANGE N/A 10/11/2021   Procedure: SPINAL CORD STIMULATOR BATTERY EXCHANGE;  Surgeon: Venita Lick, MD;  Location: [facility] OR;  Service: Orthopedics;  Laterality: N/A;  60 mins   SPINAL CORD STIMULATOR INSERTION N/A  11/21/2016   Procedure: LUMBAR SPINAL CORD STIMULATOR INSERTION;  Surgeon: Venita Lick, MD;  Location: [facility] OR;  Service: Orthopedics;  Laterality: N/A;  Requests 2.5 hrs   TONSILLECTOMY     1953   UPPER GI ENDOSCOPY  [id number]   VASECTOMY     1980   Patient Active Problem List   Diagnosis Date Noted   Attention or concentration deficit [R41.840] 05/30/2023   MCI (mild cognitive impairment) 05/30/2023   Generalized anxiety disorder    Acquired hallux rigidus, right 03/25/2023   OSA (obstructive sleep apnea) 11/12/2022   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   Peripheral vascular disease 12/25/2021   Dizziness 12/24/2021   Leg pain, left 11/14/2021   Chronic low back pain with sciatica 11/14/2021   Spinal cord stimulator status 11/14/2021   Muscle spasm 11/14/2021   Slow transit constipation 09/11/2021   Early satiety 09/11/2021   Hoarseness 09/11/2021   Iron deficiency 09/11/2021   History of colonic polyps 09/11/2021   Weight decreased 09/11/2021   Esophageal motility disorder 09/11/2021   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   Pain in both feet 08/10/2021   Atherosclerosis of aorta 08/30/2020   Mild concentric left ventricular hypertrophy (LVH)    Hypogonadism male    History of kidney stones    Erectile dysfunction    COPD (chronic obstructive pulmonary disease)    Acquired trigger finger of left middle finger 04/22/2019   Acquired trigger finger of right middle finger 04/22/2019   Left ventricular dysfunction 10/12/2018   Ankylosing spondylitis 10/09/2018   Presbycusis of both ears 04/23/2018   Chronic pain 11/21/2016   Arthritis 10/15/2016   Spondylolisthesis of lumbar region 02/28/2015   Hyperlipidemia    Lumbar disc disease    Hypertension    CAD (coronary artery disease) 05/19/2012   GERD (gastroesophageal reflux disease)    Major depressive disorder    Chronic asthma 05/17/2011   Allergic rhinitis due to pollen 08/08/2010   History of benign positional vertigo    ADHD,  predominantly hyperactive type     ONSET DATE: 08/19/2023 (referral date - based off screen recommendations)  REFERRING DIAG: Parkinson's Disease R41.3 (ICD-10-CM) - Memory impairment  THERAPY DIAG:  Other lack of coordination  Unsteadiness on feet  Abnormal posture  Muscle weakness (generalized)  Visuospatial deficit  Rationale for Evaluation and Treatment: Rehabilitation  SUBJECTIVE:   SUBJECTIVE STATEMENT:  I haven't had any new falls since last session.    I get distracted easily. I have had dizziness for years.  Pt accompanied by: self  PERTINENT HISTORY: PD (diagnosed Spring 2024), ADHD, CAD, COPD, HTN, HLD, Dizziness, MI  PRECAUTIONS: Fall and Other: no driving, spinal nerve stimulator  WEIGHT BEARING RESTRICTIONS: No  PAIN:  Are you having pain? No  FALLS: Has patient fallen in last 6 months? 16fall  LIVING ENVIRONMENT: Lives with: lives with their spouse Gigi Gin") and 3 cats Lives in:1 story home, 3-4 steps to enter (ramp on back). 4 steps down to washer/dryer but wife does Has following equipment at home: Dan Humphreys - 2 wheeled  PLOF: Independent with basic ADLs, retired  PATIENT GOALS: get better  OBJECTIVE:  Note: Objective measures were completed at Evaluation unless otherwise noted.  HAND DOMINANCE: Right  ADLs: Overall ADLs: slower Transfers/ambulation related to ADLs: no device in house Eating: independent Grooming: independent UB Dressing: slower with jacket, difficulty and occasional assist with buttons LB Dressing: slower, assist for suspenders Toileting: independent Bathing: mod I - pt has grab bars Tub Shower transfers: walk in shower, but currently using tub/shower combo Equipment: Grab bars  IADLs: Shopping: pt accompanies wife occasionally Light housekeeping: pt washes dishes Meal Prep: pt cooks some [employer] mobility: relies on wife for transportation. Has not driven in a year since diagnosis Medication management:  pt/wife loads pillbox together, pt sometimes forgets to take meds Financial management: wife does Handwriting: 100% legible and Moderate micrographia  MOBILITY STATUS:  uses walker outside of home, mostly independent in home  POSTURE COMMENTS:  rounded shoulders and forward head   FUNCTIONAL OUTCOME MEASURES: (from screen on 08/19/23)  Fastening/unfastening 3 buttons: 50 sec Physical performance test: PPT#2 (simulated eating) 14 sec & PPT#4 (donning/doffing jacket): 22 sec  COORDINATION: (from screen on 08/19/23)  9 Hole Peg test: Right: 39 sec; Left: 40 sec Box and Blocks:  Right 37 blocks, Left 45 blocks Tremors: Resting and mild  UE ROM:  WFL   SENSATION: WFL  MUSCLE TONE: not tested  COGNITION: Overall cognitive status: Impaired and decreased memory . Pt also has ADHD (premorbid) and reports he's easily distracted.   OBSERVATIONS: Bradykinesia and Hypokinesia, stooped posture, shuffling feet, arthritis bilateral hands w/ MP ulnar drift                                                                                                                    TREATMENT DATE: 09/16/23  Wife reports legs off side of bed at night and kicks sideboard (possibly due to restless leg syndrome and/or active dreams) - recommended soft bed rail (not metal frames) to keep legs inside of bed, if falling out of bed is a concern  Pt issued ADL strategies, cape method for donning jacket, and handwriting strategies and reviewed. Instructed pt to sit for donning/doffing pants over feet, donning/doffing shoes and socks to prevent falls. Instructed pt to also get shower chair to sit for bathing (especially for washing lower legs/feet and washing/rinsing face and hair). Also instructed pt to stand with back against kitchen counter or high sturdy table for donning/doffing jacket.   Practiced donning/doffing jacket x 3.   Practiced handwriting w/ max cues to slow down, aim big, and formulate each letter. Pt  unable to do this even  with max cueing, therefore pt instructed to write in print and using graph style paper with much better success doing this.   Practiced trying to don suspenders correctly but ultimately feel pt will have more success using belt w/ pants, or elastic or drawstring pants. Wife agrees. Pt prefers suspenders but do not feel pt will be able to do I'ly d/t cognitive deficits    PATIENT EDUCATION: Education details: see above Person educated: Patient and Spouse Education method: Explanation, Demonstration, Tactile cues, Verbal cues, and Handouts Education comprehension: verbalized understanding, returned demonstration, verbal cues required, tactile cues required, and needs further education  HOME EXERCISE PROGRAM: 09/10/23: [place]bin Felling! Hands, MP finger walking ex, coordination HEP 09/16/23: ADL strategies, handwriting strategies  GOALS: Goals reviewed with patient? Yes  SHORT TERM GOALS: Target date: 10/02/23  Independent with PD specific HEP (including finger walking ex to counteract MP ulnar drift)    Baseline: Goal status: IN PROGRESS  2.  Pt will verbalize understanding of ways to keep thinking skills sharp and ways to compensate for STM changes for medication management Baseline:  Goal status: INITIAL  3.  Pt will write a sentence with no significant decrease in size and maintain 100% legibility  Baseline:  Goal status: IN PROGRESS (using graph paper and writing in print)  4.  Pt will demonstrate increased ease with dressing as evidenced by decreasing PPT#4 (don/ doff jacket) to 18 secs or less  Baseline: 22 sec Goal status: IN PROGRESS  5.  Pt will verbalize understanding of adaptive strategies to increase ease with ADLS/IADLS   Baseline:  Goal status: IN PROGRESS  6.  Pt to improve RUE as evidenced by increasing Box & Blocks by 4 or more Baseline: 37 Goal status: INITIAL  LONG TERM GOALS: Target date: 11/01/23  Pt will verbalize understanding of ways to prevent  future PD related complications and appropriate community resources prn  Baseline:  Goal status: INITIAL  2.  Pt will demonstrate improved ease with fastening buttons as evidenced by decreasing 3 button/unbutton time by 8 seconds or more Baseline: 50 sec Goal status: INITIAL  3.  Pt will demonstrate improved fine motor coordination for ADLs as evidenced by decreasing 9 hole peg test score for each hand and by 5 secs  Baseline: Rt = 39 sec, Lt = 40 sec.  Goal status: INITIAL  4.  Pt will write a paragraph with no significant decrease in size and maintain 100% legibility  Baseline:  Goal status: INITIAL  5.  Pt will consistently don suspenders I'ly Baseline: needs assist Goal status: INITIAL  ASSESSMENT:  CLINICAL IMPRESSION: Patient was seen today for occupational therapy treatment for PD. ADL strategies given w/ extensive review/cueing needed. Patient currently presents below baseline level of functioning demonstrating functional deficits and impairments as noted below. Pt would benefit from continued skilled OT services in the outpatient setting to work on impairments as noted below, develop PD specific HEP, and prevent future related PD complications. [name]   PERFORMANCE DEFICITS: in functional skills including ADLs, IADLs, coordination, dexterity, proprioception, strength, Fine motor control, Gross motor control, mobility, balance, body mechanics, decreased knowledge of precautions, decreased knowledge of use of DME, vision, and UE functional use, cognitive skills including attention, memory, problem solving, and safety awareness, and psychosocial skills including coping strategies.   IMPAIRMENTS: are limiting patient from ADLs, IADLs, leisure, and social participation.   COMORBIDITIES:  may have co-morbidities  that affects occupational performance. Patient will benefit from skilled OT to address above impairments and improve  overall function.  MODIFICATION OR ASSISTANCE TO COMPLETE  EVALUATION: No modification of tasks or assist necessary to complete an evaluation.  OT OCCUPATIONAL PROFILE AND HISTORY: Detailed assessment: Review of records and additional review of physical, cognitive, psychosocial history related to current functional performance.  CLINICAL DECISION MAKING: Moderate - several treatment options, min-mod task modification necessary  REHAB POTENTIAL: Good  EVALUATION COMPLEXITY: Moderate    PLAN:  OT FREQUENCY: 2x/week  OT DURATION: 8 weeks  PLANNED INTERVENTIONS: 97535 self care/ADL training, 16109 therapeutic exercise, 97530 therapeutic activity, 97112 neuromuscular re-education, 97140 manual therapy, 97018 paraffin, 60454 fluidotherapy, 97010 moist heat, 97034 contrast bath, 97760 Orthotics management and training, 09811 Splinting (initial encounter), [id number] Subsequent splinting/medication, passive range of motion, functional mobility training, visual/perceptual remediation/compensation, energy conservation, coping strategies training, patient/family education, and DME and/or AE instructions  RECOMMENDED OTHER SERVICES: none at this time  CONSULTED AND AGREED WITH PLAN OF CARE: Patient and family member/caregiver  PLAN FOR NEXT SESSION: Practice buttons, continue to reinforce strategies for ADLS including: sitting for LE dressing/bathing for safety, standing against counter to don/doff jacket, and writing in print with graph paper. Issue memory strategies and ways to keep thinking skills sharp,  ? Bag ex's for ADLS   Sheran Lawless, OT 02/08/4781, 11:07 AM

## 2023-09-16 NOTE — Therapy (Signed)
 OUTPATIENT PHYSICAL THERAPY NEURO TREATMENT   Patient Name: [name] MRN: [id number] DOB:[birth year], 78 y.o., male Today's Date: 09/16/2023   PCP: Ronnald Nian, MD   REFERRING PROVIDER: Vladimir Faster, DO   END OF SESSION:  PT End of Session - 09/16/23 1018     Visit Number 4    Number of Visits 7    Date for PT Re-Evaluation 10/31/23    Authorization Type [employer] Medicare    PT Start Time 1017    PT Stop Time 1057    PT Time Calculation (min) 40 min    Equipment Utilized During Treatment Gait belt    Activity Tolerance Patient tolerated treatment well    Behavior During Therapy WFL for tasks assessed/performed             Past Medical History:  Diagnosis Date   Acquired hallux rigidus, right 03/25/2023   Acquired trigger finger of left middle finger 04/22/2019   Acquired trigger finger of right middle finger 04/22/2019   ADHD, predominantly hyperactive type    Allergic rhinitis due to pollen 08/08/2010   Allergy vaccine 1:50,000 03/25/08; 1:10 03/27/09 > 1:2 GH >> DC'd 01/04/2015 for observation        Anemia    takes Fe -    Anginal pain    Ankylosing spondylitis 10/09/2018   Arthritis    Atherosclerosis of aorta 08/30/2020   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 Donnie Aho   Chronic asthma 05/17/2011   Chronic low back pain with sciatica 11/14/2021   Chronic pain 11/21/2016   COPD (chronic obstructive pulmonary disease)    Dizziness 12/24/2021   Early satiety 09/11/2021   Erectile dysfunction    Esophageal motility disorder 09/11/2021   Generalized anxiety disorder    GERD (gastroesophageal reflux disease)    History of benign positional vertigo    History of colonic polyps 09/11/2021   History of kidney stones    Hoarseness 09/11/2021   Hyperlipidemia    Hypertension    Hypogonadism male    prior use on Testim   Insomnia    Iron deficiency 09/11/2021   Left ventricular dysfunction 10/12/2018   Leg pain, left  11/14/2021   Lumbar disc disease    Major depressive disorder    Mild concentric left ventricular hypertrophy (LVH)    Muscle spasm 11/14/2021   Old inferior myocardial infarction 1995   OSA (obstructive sleep apnea) 11/12/2022   NPSG 10/21/22- AHI 28.7/ hr, desat to 89%/Mean 93%, body weight 165 lbs     Pain in both feet 08/10/2021   Peripheral vascular disease 12/25/2021   Pneumonia    Presbycusis of both ears 04/23/2018   Shortness of breath dyspnea    Slow transit constipation 09/11/2021   Spinal cord stimulator status 11/14/2021   Spondylolisthesis of lumbar region 02/28/2015   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 Donnie Aho   CHEILECTOMY  10/16/2011   Procedure: CHEILECTOMY;  Surgeon: Sherri Rad, MD;  Location: [facility];  Service: Orthopedics;  Laterality: Right;   CHOLECYSTECTOMY     COLONOSCOPY     CORONARY ANGIOPLASTY     1995 after MI   CORONARY  ANGIOPLASTY WITH STENT PLACEMENT     1999    ESOPHAGOGASTRODUODENOSCOPY  02/18/2012   Procedure: ESOPHAGOGASTRODUODENOSCOPY (EGD);  Surgeon: Florencia Reasons, MD;  Location: [facility];  Service: Endoscopy;  Laterality: N/A;   FOOT SURGERY     joint scraped, arthritis right foot   HERNIA REPAIR     LUMBAR DISC SURGERY     x2   right index finger  mass removed- arthritis     Dr. Amanda Pea   [facility]  Right 05/18/2021   medial toe consistent with callus   SKIN BIOPSY Left 05/17/2021   actinic keratosis   SPINAL CORD STIMULATOR BATTERY EXCHANGE N/A 10/11/2021   Procedure: SPINAL CORD STIMULATOR BATTERY EXCHANGE;  Surgeon: Venita Lick, MD;  Location: [facility];  Service: Orthopedics;  Laterality: N/A;  60 mins   SPINAL CORD STIMULATOR INSERTION N/A  11/21/2016   Procedure: LUMBAR SPINAL CORD STIMULATOR INSERTION;  Surgeon: Venita Lick, MD;  Location: [facility];  Service: Orthopedics;  Laterality: N/A;  Requests 2.5 hrs   TONSILLECTOMY     1953   UPPER GI ENDOSCOPY  [id number]   VASECTOMY     1980   Patient Active Problem List   Diagnosis Date Noted   Attention or concentration deficit [R41.840] 05/30/2023   MCI (mild cognitive impairment) 05/30/2023   Generalized anxiety disorder    Acquired hallux rigidus, right 03/25/2023   OSA (obstructive sleep apnea) 11/12/2022   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   Peripheral vascular disease 12/25/2021   Dizziness 12/24/2021   Leg pain, left 11/14/2021   Chronic low back pain with sciatica 11/14/2021   Spinal cord stimulator status 11/14/2021   Muscle spasm 11/14/2021   Slow transit constipation 09/11/2021   Early satiety 09/11/2021   Hoarseness 09/11/2021   Iron deficiency 09/11/2021   History of colonic polyps 09/11/2021   Weight decreased 09/11/2021   Esophageal motility disorder 09/11/2021   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   Pain in both feet 08/10/2021   Atherosclerosis of aorta 08/30/2020   Mild concentric left ventricular hypertrophy (LVH)    Hypogonadism male    History of kidney stones    Erectile dysfunction    COPD (chronic obstructive pulmonary disease)    Acquired trigger finger of left middle finger 04/22/2019   Acquired trigger finger of right middle finger 04/22/2019   Left ventricular dysfunction 10/12/2018   Ankylosing spondylitis 10/09/2018   Presbycusis of both ears 04/23/2018   Chronic pain 11/21/2016   Arthritis 10/15/2016   Spondylolisthesis of lumbar region 02/28/2015   Hyperlipidemia    Lumbar disc disease    Hypertension    CAD (coronary artery disease) 05/19/2012   GERD (gastroesophageal reflux disease)    Major depressive disorder    Chronic asthma 05/17/2011   Allergic rhinitis due to pollen 08/08/2010   History of benign positional vertigo    ADHD,  predominantly hyperactive type     ONSET DATE: 08/19/2023  REFERRING DIAG: G21.4 (ICD-10-CM) - Vascular parkinsonism (HCC)   THERAPY DIAG:  Other lack of coordination  Unsteadiness on feet  Abnormal posture  Muscle weakness (generalized)  Rationale for Evaluation and Treatment: Rehabilitation  SUBJECTIVE:  SUBJECTIVE STATEMENT: No falls. Sometimes lifts up the RW when walking over unlevel surfaces because he does not want to wear out his walker tennis balls.  Pt accompanied by:  Self, spouse [name]    PERTINENT HISTORY: PMH: Vascular Parkinsonism (2024), CAD, anxiety, insomnia, depression, GERD, hypertension, lumbar disc disease with spinal cord stimulator, COPD, asthma, ADHD, chronic pain, chronic vertigo, MCI   PAIN:  Are you having pain? No  PRECAUTIONS: Fall Spinal cord stimulator   FALLS: Has patient fallen in last 6 months? No  LIVING ENVIRONMENT: Lives with: lives with their spouse Lives in: House/apartment Stairs: Yes: External: 3 in front and 5 in back steps; on right going up, on left going up, can reach both, and always uses back steps (where he has the most falls)  Also has 5 steps to get down to utility room with no handrails  Has following equipment at home: Walker - 2 wheeled  PLOF: Independent with community mobility with device Notices more difficulty with buttons   PATIENT GOALS: "I want to be 78 years old"  Wants to get up out of a chair without feeling unsteady   OBJECTIVE:  Note: Objective measures were completed at Evaluation unless otherwise noted.   COGNITION: Overall cognitive status: Impaired, pt with MCI    SENSATION: Pt reports occasional tingling in R foot, notes L foot will just hurt   COORDINATION: Heel to shin: slightly slower with RLE     POSTURE: rounded shoulders, forward head, and posterior pelvic tilt  LOWER EXTREMITY ROM:    Decr knee extension AROM RLE > LLE   LOWER EXTREMITY MMT:    MMT Right Eval Left Eval  Hip flexion 5 5  Hip extension    Hip abduction 4 4  Hip adduction 5 5  Hip internal rotation    Hip external rotation    Knee flexion 5 5  Knee extension 4+ 4+  Ankle dorsiflexion 3- 4  Ankle plantarflexion    Ankle inversion    Ankle eversion    (Blank rows = not tested)  All tested In sitting   BED MOBILITY:  Pt reports more difficulty getting out of bed at night, pt does not give a specific answer of what is challenging about this   TRANSFERS: Assistive device utilized: None  Sit to stand: CGA Stand to sit: SBA and CGA  Pt needing to use BUE support to come to stand, decr anterior weight shift. In standing, pt with incr knee flexion. Pt with decr eccentric control when lowering to chair. Pt needs cues to fully back up to chair before sitting down.   Pt attempting to stand without UE support, pt does not scoot out far enough to edge of seated or have enough anterior weight shift, pt tries to rock and stand multiple times, but unable to come to stand   GAIT: Gait pattern: step through pattern, decreased step length- Right, decreased step length- Left, decreased stride length, decreased hip/knee flexion- Right, decreased hip/knee flexion- Left, decreased ankle dorsiflexion- Right, decreased ankle dorsiflexion- Left, shuffling, trunk flexed, poor foot clearance- Right, and poor foot clearance- Left Distance walked: Clinic distances Assistive device utilized: Walker - 2 wheeled Level of assistance: SBA and CGA Comments: With use of RW, pt with decr foot clearance with RLE and incr forward flexed posture while pushing RW anteriorly. Also when turning, needs cues to keep RW on the ground instead of picking it up when turning. Pt intermittently catching R>L foot on the floor  FUNCTIONAL  TESTS:  5 times sit to stand: 16.5 seconds with BUE support from chair  Timed up and go (TUG): 21.1 seconds with RW  10 meter walk test: 16.3 seconds = 2.01 ft/sec                                                                                                                                  TREATMENT DATE:   Self-Care:  Discussed purpose of U-Step Rollator for Parkinson's/Parkinsonisms, went over that Dr. Arbutus Leas showed pt this walker during pt's previous appt with her After trialing U-Step rollator again in clinic (also trialed in previous POC and decided that it was not the safest option for pt) - came to conclusion that U-Step Rollator is not the safest option for pt as pt pushes it too far anteriorly and walks too forwards on his toes. Pt can't self correct gait with taking bigger steps and staying close to walker or lets go of U-Step brakes when he starts to catch his toes. Discussed 2 wheeled RW is still the most stable and safe option for pt at this time as it does not get away from pt when pt has incr forward flexed posture and ambulates on his toes  Went over freezing strategies as pt reports recent freezing episodes and provided handout for pt and educated pt's spouse on appropriate cues to give Focused on STOPPING, resetting/standing tall and taking a few breaths, and then rocking before taking a big step to continue ambulation again   Gait:   GAIT: Gait pattern: step through pattern, decreased step length- Right, decreased step length- Left, decreased stride length, decreased hip/knee flexion- Right, decreased hip/knee flexion- Left, decreased ankle dorsiflexion- Right, decreased ankle dorsiflexion- Left, shuffling, trunk flexed, poor foot clearance- Right, and poor foot clearance- Left Distance walked: 115' x 1 with RW, 230' x 1 with U-Step Rollator  Assistive device utilized: Environmental consultant - 2 wheeled, U-Step Rollator  Level of assistance: SBA and CGA Comments: Trialed U-Step Occupational hygienist and  educated pt on how to use it regarding the brakes. Ambulated 230' x 1 with U-Step with PT initiating providing cues about taking longer strides/foot clearance and staying close to walker. Pt does not maintain and pushes U-Step way too far anteriorly and can not correct and has toe strike and decr foot clearance, pt with a couple instances of catching his toes during ambulation, requiring CGA and pt not letting go of the U-Step brakes. Even with intermittent cues from therapist, pt unable to perform foot clearance or stride length.   With use of RW, pt with decr foot clearance with RLE and incr forward flexed posture while pushing RW anteriorly. Practiced techniques for freezing over 115', with focus on stopping, standing tall, rocking, and then taking a big step. When ambulating, cues for walking with intent with focus on heel strike and taking larger steps and staying inside RW as pt pushes RW too far anteriorly  NMR:   Sit <> stands from mat table: Initial reminder cues with focus on scooting out to the edge, tucking feet back, and getting nose over toes, pt performing without UE support, 5 reps, intermittent cues to make sure pt gets incr forward lean to stand  Holding 4# ball and tossing to floor and catching it x10 reps working on lifting ball over head and big slam to the floor with enough amplitude to catch, pt taking a few tries to perform the proper amplitude, progressing to performing a sit > stand, tossing to floor and catching and then siting back down, performed 10 reps, intermittent cues for incr forward lean, CGA for balance   PATIENT EDUCATION: Education details: See self-care section Person educated: Patient and Spouse Education method: Explanation, Demonstration, Verbal cues, and Handouts Education comprehension: verbalized understanding, returned demonstration, and needs further education  HOME EXERCISE PROGRAM: Access Code: [facility] URL:  [email or web address] Date: 09/08/2023 Prepared by: Sherlie Ban  Exercises - Sit to Stand  - 1-2 x daily - 5 x weekly - 2 sets - 10 reps  Seated PWR Moves  GOALS: Goals reviewed with patient? Yes  SHORT TERM GOALS: Target date: 09/22/2023  Pt will be independent with initial HEP for strengh, balance, walking in order to build upon functional gains made in therapy. Baseline: Goal status: INITIAL  2.  Pt and pt's spouse will verbalize freezing strategies in order to decr risk of falls.  Baseline:  Goal status: INITIAL   LONG TERM GOALS: Target date: 10/13/2023  Pt will be independent with final HEP for strengh, balance, walking in order to build upon functional gains made in therapy. Baseline:  Goal status: INITIAL  2.  Pt will improve 5x sit<>stand to less than or equal to 17 sec without UE support to demonstrate improved functional strength and transfer efficiency.   Baseline: 16.5 seconds with BUE support from chair  Goal status: INITIAL  3.  Pt will improve TUG time to 18 seconds  or less with RW in order to demo decrease fall risk.  Baseline: 21.1 seconds with RW Goal status: INITIAL  4.  Pt will improve gait speed with RW to at least 2.3 ft/sec for improved gait efficiency and step length in order to demo improved community mobility.   Baseline: 16.3 seconds = 2.01 ft/sec Goal status: INITIAL    ASSESSMENT:  CLINICAL IMPRESSION: Pt reporting having some freezing episodes when ambulating at home with RW. Worked on strategies to address this,with stopping, resetting and rocking before taking a big step with RW. Provided handout and also educated pt's spouse on appropriate cues to give due to cognitive deficits. Also tried the U-Step Rollator again today with pt demonstrating incr forward flexed posture, decr foot clearance, pushing rollator too far anteriorly, and had a couple instances of catching his toes. Due to pt unable to correct his gait on his  own or with cues from therapist, discussed safety concerns with use of U-Step. Educated to continue using RW at this time for gait for stability. Pt and pt's spouse in agreement. Will continue per POC.     OBJECTIVE IMPAIRMENTS: Abnormal gait, decreased activity tolerance, decreased balance, decreased cognition, decreased coordination, decreased endurance, decreased knowledge of use of DME, decreased mobility, difficulty walking, decreased ROM, decreased strength, decreased safety awareness, impaired flexibility, impaired sensation, and postural dysfunction.   ACTIVITY LIMITATIONS: carrying, lifting, bending, standing, stairs, transfers, bed mobility, reach over head, and locomotion level  PARTICIPATION LIMITATIONS: driving, shopping, community activity, and yard  work  PERSONAL FACTORS: Age, Behavior pattern, Fitness, Past/current experiences, Time since onset of injury/illness/exacerbation, and 3+ comorbidities: Vascular Parkinsonism (2024), CAD, anxiety, insomnia, depression, GERD, hypertension, lumbar disc disease with spinal cord stimulator, COPD, asthma, ADHD, chronic pain, chronic vertigo, MCI   are also affecting patient's functional outcome.   REHAB POTENTIAL: Fair due to cognition and progressive diagnosis   CLINICAL DECISION MAKING: Evolving/moderate complexity  EVALUATION COMPLEXITY: Moderate  PLAN:  PT FREQUENCY: 1-2x/week  PT DURATION: 8 weeks  PLANNED INTERVENTIONS: 97164- PT Re-evaluation, 97110-Therapeutic exercises, 97530- Therapeutic activity, 97112- Neuromuscular re-education, 97535- Self Care, 16109- Manual therapy, [phone]- Gait training, Patient/Family education, Balance training, Stair training, Vestibular training, and DME instructions  PLAN FOR NEXT SESSION: work on balance, give walking program for home, work on sit <> stand transfers, freezing strategies    Drake Leach, PT, DPT 09/16/2023, 11:24 AM

## 2023-09-18 ENCOUNTER — Ambulatory Visit: Admitting: Psychology

## 2023-09-18 DIAGNOSIS — G3184 Mild cognitive impairment, so stated: Secondary | ICD-10-CM

## 2023-09-18 DIAGNOSIS — R41840 Attention and concentration deficit: Secondary | ICD-10-CM

## 2023-09-18 NOTE — Progress Notes (Signed)
   NEUROPSYCHOLOGY FEEDBACK SESSION [place]. [facility]  [place] Department of Neurology  Date of Feedback Session: 09/18/2023  REASON FOR REFERRAL   [name] is a 78 year old, right-handed, White male with 18 years of formal education. He was returned for neuropsychological evaluation to assess current neurocognitive functioning, document potential cognitive deficits, and assist with treatment planning. He previously underwent assessment with Rosann Auerbach, Ph.D., on 05/05/2023. Results from that evaluation were used as a baseline for comparison.   FEEDBACK   Patient completed a comprehensive neuropsychological evaluation on 09/11/2023. Please refer to that encounter for the full report and recommendations. Briefly, results indicated continued variability in attention, processing speed, executive functioning, and learning/memory. Compared to the prior evaluation in 2024, scores were generally stable, although mild, relative reductions in attention, processing speed, and executive functioning were observed. Given preserved functional independence, the patient continues to meet criteria for mild cognitive impairment. Etiology remains likely multifactorial, including a longstanding history of attention difficulties, possible vascular parkinsonism, chronic pain, mild low mood, and potential medication side effects (e.g., alprazolam, meclizine). It is unclear why some scores were weaker today, especially given that the patient and his wife report stability and there have been no acute medical events documented. Continued monitoring over time is recommended.   Today, the patient was accompanied by his wife. They ere provided verbal feedback regarding the findings and impression during this visit, and their questions were answered. A copy of the report was provided at the conclusion of the visit.  DISPOSITION   Patient will follow up with Dr. Arbutus Leas. He should return for repeat  neuropsychological testing no sooner than 12-18 months to monitor his course and assist with diagnosis and treatment planning.  SERVICE   This feedback session was conducted by Annice Pih, Psy.D. One unit of 16109 was billed for Dr. Robbie Lis' time spent in preparing, conducting, and documenting the current feedback session.  This report was generated using voice recognition software. While this document has been carefully reviewed, transcription errors may be present. I apologize in advance for any inconvenience. Please contact me if further clarification is needed.

## 2023-09-19 ENCOUNTER — Ambulatory Visit: Admitting: Speech Pathology

## 2023-09-19 DIAGNOSIS — R471 Dysarthria and anarthria: Secondary | ICD-10-CM | POA: Diagnosis not present

## 2023-09-19 DIAGNOSIS — R41841 Cognitive communication deficit: Secondary | ICD-10-CM

## 2023-09-19 NOTE — Therapy (Signed)
 OUTPATIENT SPEECH LANGUAGE PATHOLOGY [facility]   Patient Name: [name] MRN: [id number] DOB:05-08-1946, 78 y.o., male Today's Date: 09/19/2023  PCP: Ronnald Nian, MD REFERRING PROVIDER: Vladimir Faster, DO  END OF SESSION:  End of Session - 09/19/23 0933     Visit Number 5    Number of Visits 17    Date for SLP Re-Evaluation 10/27/23    Authorization Type [employer] medicare    Progress Note Due on Visit 10    SLP Start Time 0932    SLP Stop Time  1015    SLP Time Calculation (min) 43 min    Activity Tolerance Patient tolerated treatment well               Past Medical History:  Diagnosis Date   Acquired hallux rigidus, right 03/25/2023   Acquired trigger finger of left middle finger 04/22/2019   Acquired trigger finger of right middle finger 04/22/2019   ADHD, predominantly hyperactive type    Allergic rhinitis due to pollen 08/08/2010   Allergy vaccine 1:50,000 03/25/08; 1:10 03/27/09 > 1:2 GH >> DC'd 01/04/2015 for observation        Anemia    takes Fe -    Anginal pain    Ankylosing spondylitis 10/09/2018   Arthritis    Atherosclerosis of aorta 08/30/2020   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 Donnie Aho   Chronic asthma 05/17/2011   Chronic low back pain with sciatica 11/14/2021   Chronic pain 11/21/2016   COPD (chronic obstructive pulmonary disease)    Dizziness 12/24/2021   Early satiety 09/11/2021   Erectile dysfunction    Esophageal motility disorder 09/11/2021   Generalized anxiety disorder    GERD (gastroesophageal reflux disease)    History of benign positional vertigo    History of colonic polyps 09/11/2021   History of kidney stones    Hoarseness 09/11/2021   Hyperlipidemia    Hypertension    Hypogonadism male    prior use on [employer]   Insomnia    Iron deficiency 09/11/2021   Left ventricular dysfunction 10/12/2018   Leg pain, left 11/14/2021   Lumbar disc disease    Major depressive  disorder    Mild concentric left ventricular hypertrophy (LVH)    Muscle spasm 11/14/2021   Old inferior myocardial infarction 1995   OSA (obstructive sleep apnea) 11/12/2022   NPSG 10/21/22- AHI 28.7/ hr, desat to 89%/Mean 93%, body weight 165 lbs     Pain in both feet 08/10/2021   Peripheral vascular disease 12/25/2021   Pneumonia    Presbycusis of both ears 04/23/2018   Shortness of breath dyspnea    Slow transit constipation 09/11/2021   Spinal cord stimulator status 11/14/2021   Spondylolisthesis of lumbar region 02/28/2015   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 Donnie Aho   CHEILECTOMY  10/16/2011   Procedure: CHEILECTOMY;  Surgeon: Sherri Rad, MD;  Location: [facility];  Service: Orthopedics;  Laterality: Right;   CHOLECYSTECTOMY     COLONOSCOPY     CORONARY ANGIOPLASTY     1995 after MI   CORONARY ANGIOPLASTY WITH STENT PLACEMENT     1999  ESOPHAGOGASTRODUODENOSCOPY  02/18/2012   Procedure: ESOPHAGOGASTRODUODENOSCOPY (EGD);  Surgeon: Florencia Reasons, MD;  Location: [facility];  Service: Endoscopy;  Laterality: N/A;   FOOT SURGERY     joint scraped, arthritis right foot   HERNIA REPAIR     LUMBAR DISC SURGERY     x2   right index finger  mass removed- arthritis     Dr. Amanda Pea   [facility]  Right 05/18/2021   medial toe consistent with callus   SKIN BIOPSY Left 05/17/2021   actinic keratosis   SPINAL CORD STIMULATOR BATTERY EXCHANGE N/A 10/11/2021   Procedure: SPINAL CORD STIMULATOR BATTERY EXCHANGE;  Surgeon: Venita Lick, MD;  Location: [facility] OR;  Service: Orthopedics;  Laterality: N/A;  60 mins   SPINAL CORD STIMULATOR INSERTION N/A 11/21/2016   Procedure: LUMBAR SPINAL CORD STIMULATOR  INSERTION;  Surgeon: Venita Lick, MD;  Location: [facility] OR;  Service: Orthopedics;  Laterality: N/A;  Requests 2.5 hrs   TONSILLECTOMY     1953   UPPER GI ENDOSCOPY  [id number]   VASECTOMY     1980   Patient Active Problem List   Diagnosis Date Noted   Attention or concentration deficit [R41.840] 05/30/2023   MCI (mild cognitive impairment) 05/30/2023   Generalized anxiety disorder    Acquired hallux rigidus, right 03/25/2023   OSA (obstructive sleep apnea) 11/12/2022   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   Peripheral vascular disease 12/25/2021   Dizziness 12/24/2021   Leg pain, left 11/14/2021   Chronic low back pain with sciatica 11/14/2021   Spinal cord stimulator status 11/14/2021   Muscle spasm 11/14/2021   Slow transit constipation 09/11/2021   Early satiety 09/11/2021   Hoarseness 09/11/2021   Iron deficiency 09/11/2021   History of colonic polyps 09/11/2021   Weight decreased 09/11/2021   Esophageal motility disorder 09/11/2021   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   Pain in both feet 08/10/2021   Atherosclerosis of aorta 08/30/2020   Mild concentric left ventricular hypertrophy (LVH)    Hypogonadism male    History of kidney stones    Erectile dysfunction    COPD (chronic obstructive pulmonary disease)    Acquired trigger finger of left middle finger 04/22/2019   Acquired trigger finger of right middle finger 04/22/2019   Left ventricular dysfunction 10/12/2018   Ankylosing spondylitis 10/09/2018   Presbycusis of both ears 04/23/2018   Chronic pain 11/21/2016   Arthritis 10/15/2016   Spondylolisthesis of lumbar region 02/28/2015   Hyperlipidemia    Lumbar disc disease    Hypertension    CAD (coronary artery disease) 05/19/2012   GERD (gastroesophageal reflux disease)    Major depressive disorder    Chronic asthma 05/17/2011   Allergic rhinitis due to pollen 08/08/2010   History of benign positional vertigo    ADHD, predominantly hyperactive type     ONSET DATE:  08/19/2023 - referral date  REFERRING DIAG: R49.0 (ICD-10-CM) - Dysphonia  THERAPY DIAG:  Cognitive communication deficit  Dysarthria and anarthria  Rationale for Evaluation and Treatment: Rehabilitation  SUBJECTIVE:   SUBJECTIVE STATEMENT: Pt reports completing HEP every other day, SLP provided education on importance of daily completion.  Pt accompanied by: significant other [name]  PERTINENT HISTORY: Dr. Arbutus Leas has previously theorized a vascular parkinsonian presentation being more likely than idiopathic Parkinson's disease. Marthann Schiller is known to [place] from prior course of ST 11/29/22-02/04/23.   Neuropsych eval Nov 2024: Mr. Peart' pattern of performance is suggestive of impairment surrounding executive functioning. Performance variability was further exhibited across processing speed, complex attention, semantic  fluency, and both encoding (i.e., learning) and delayed retrieval aspects of memory.         MCI             -suspect due to meds             -Neurocognitive testing with Dr. Milbert Coulter confirmed MCI in November, 2024.  He mentioned medications as a role in diminishing cognition.  PAIN:  Are you having pain? No  PATIENT REPORTED OUTCOME MEASURES (PROM): - Total 18 The Communicative Participation Item Bank        Does your condition interfere with... Pt Rating   ...talking with people you know 2   ...communicating when you need to say something quickly 3   ...talking with people you do not know 2   ...communicating when you are out in your community 1   ...asking questions in a conversation 2   ....communicating in a small group of people 2   ...having a long conversation 2   ...giving detailed infomrmation 3   ...getting your turn in a fast moving conversation 1   ...trying to persuade a friend or family member to see a different point of view 2  3= Not at all; 2=A little; 1=Quite a bit; 0=Very much                                                                                                                              TREATMENT DATE:  09/19/23: Addressed dysarthria via Speak Out lesson #2. Pt required usual mod to max-A and modeling for accurate completion of therapy exercises. Pt averages the following:   Sustained ah: 84  Counting: 82  Reading: 79  Cognitive Exercise: 75 with usual mod verbal cues and gesture cues Minimal carryover for side comments or conversational speech though pt is able to repeat prior utterances with verbal cues for use of intent. Reviewed cough alternatives, as pt demonstrating coughing this date, endorsing strong cough sensation. Minimal effectiveness in utilizing alternatives this date. Will continue to address.   09/16/23: Exhibited initial throat clear with awareness of need to reduce habit. Pt able to carryover throat clear alternatives with rare min x4 additional opportunities this date. Recently received new Speak Out! Workbook. Has watched some online lessons with inconsistent completion. Re-educated recommendations for daily practice, in which pt verbalized understanding. Indicated feeling self-conscious completing with wife and Librarian, academic. Assisted with re-framing assistance to aid confidence and carryover versus judgement. Re-educated Lesson #1. Usual model and mod A provided to complete targeted tasks and maintain intent to achieve dB levels.  Pt averages the following volume levels:  Sustained AH: 86   Counting:84  Reading: 82  Cognitive Exercise: 79 with usual mod verbal cues and gesture cues Required frequent mod gestures and verbal cues, modeling for carryover of intent /volume when conversation was interjected during and after Speak Out! Lesson  Of note, pt instructed to bring own water bottle to next ST session  to aid hydration.   09/10/23: Mitch completed online home practice session on [employer] website. He reports his wife signed up for [employer] as well.  Targeted volume and intelligibility using Speak Out! Lesson E [employer]  and About [employer]. Pt required usual min verbal cues, modeling, for volume and breath support.  Pt averages the following volume levels:  Sustained AH: 85 - cues required to keep volume and pitch steady and consistent  Counting:85  Reading (phrases): 80  Cognitive Exercise: 76 with usual mod verbal cues and gesture cues Required frequent mod gesture and verbal cues, modeling for carryover of intent /volume when conversation was interjected into Speak Out! Lesson   09/08/23: They have not signed up for e Library on [employer] ([employer]) website - Instructed them to do this - submitted [name]'s email to [employer] for access to [employer]. They both watched the required webinar on [employer] website. Targeted volume and intelligibility using Speak Out! Lesson April Week 1 on [employer] e Libary. Pt required occasional min  verbal cues, modeling, for volume and breath support. Pt averages the following volume levels:  Sustained AH: 85  Counting:80dB with occasional min cues for smooth and connected counting Pitch glides required usual mod modeling, gesture cues to achieve pitch change and prolong glides  Reading (phrases): 75  Cognitive Exercise: 72dB Required usual lmod verbal cues, modeling for carryover of intent /volume answering simple questions following structured practice.  Completed CPIB PROM - See above    09/01/23 (eval day): Eval completed. Initiated training in compensatory strategies for slow processing - see Patient Instructions. Initiated Speak Out! Lesson one to review and instruct on HEP - With occasional min  A, Mitch achieved the dB targets for warm ups, sustained Ah and counting. He required usual mod A to maintain volume on glides and conversation exercises. Demonstrated the Parkinson [employer] website and instructed Marthann Schiller and Gigi Gin to watch the What is Parkinsons and Living with Parkinsons webinar. Introduced the on line home practice sessions as well. In conversation, Marthann Schiller  responded well to frequent mod verbal cues and visual cues to carryover intentional speech to average 70dB. Provided a log to track completion of HEP   PATIENT EDUCATION: Education details: see above Person educated: Patient and Spouse Education method: Explanation, Demonstration, Verbal cues, and Handouts Education comprehension: verbalized understanding, returned demonstration, verbal cues required, and needs further education  HOME EXERCISE PROGRAM: Speak Out! Therapy Program to be completed twice daily    GOALS: Goals reviewed with patient? Yes  SHORT TERM GOALS: Target date: 09/29/23  Pt will complete HEP for dysarthria with occasional min A Baseline: Goal status: ONGOING  2.  Pt will average 72dB 18/20 sentences Baseline:  Goal status: MET  3.  Pt will complete HEP twice a week 5/7 days outside of ST Baseline:  Goal status: ONGOING  4.  Pt will carryover 2 compensatory strategies to support processing speed in conversation Baseline:  Goal status:ONGOING  5.  Pt will average 72dB over 8 minute conversation with occasional min A Baseline:  Goal status: ONGOING   LONG TERM GOALS: Target date: 10/27/23  Pt will complete HEP for dysarthria with mod I Baseline:  Goal status:ONGOING  2.  Pt will ID and correct hoarse low volume with supervision cues Baseline:  Goal status: ONGOING  3.  Pt will average 72dB over 15 minute conversation Baseline:  Goal status: ONGOING  4.  Pt will participate in 2 on line home practice sessions on the  Parkinson [employer] website Baseline:  Goal status: ONGOING  5.  Pt will verbalize a plan/schedule to continue daily Speak Out! Practice after d/c from ST Baseline:  Goal status:ONGOING  6.  Pt will improve score on Communication Participation Item Bank Baseline: 18 Goal status: ONGOING  ASSESSMENT:  CLINICAL IMPRESSION: Patient is a 78 y.o. 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 His spouse, Gigi Gin reports difficulty hearing Mitch at a distance. Mitch endorses low volume and hoarse voice. He also endorses slow processing and difficulty getting a turn in fast conversations and group conversations. He named 15 animals and 10 "m" words in 1 minute. Mitch reports episodes where his throat "closes up" and "nothing will come out" in particular when signing. Of note, on the divergent naming task, Mitch twice had to be re-directed to task due to tangential conversation indicating possible attention impairment.(Likely prior ADHD per neuropsych). Marthann Schiller was easily stimulable for higher intensity voice which eliminate hoarse quality and improved intelligibility. I recommend skilled ST to maximize intelligibility for safety, independence and to reduce caregiver burden..   OBJECTIVE IMPAIRMENTS: Objective impairments include attention, memory, executive functioning, expressive language, and dysarthria. These impairments are limiting patient from effectively communicating at home and in community.Factors affecting potential to achieve goals and functional outcome are medical prognosis.. Patient will benefit from skilled SLP services to address above impairments and improve overall function.  REHAB POTENTIAL: Good  PLAN:  SLP FREQUENCY: 2x/week  SLP DURATION: 8 weeks  PLANNED INTERVENTIONS: Aspiration precaution training, Diet toleration management , Language facilitation, Environmental controls, Cueing hierachy, Cognitive reorganization, Internal/external aids, Functional tasks, Multimodal communication approach, SLP instruction and feedback, Compensatory strategies, Patient/family education, and [id number] Treatment of speech (30 or 45 min)     Maia Breslow, CCC-SLP 09/19/2023, 10:35 AM

## 2023-09-23 ENCOUNTER — Ambulatory Visit: Admitting: Physical Therapy

## 2023-09-23 ENCOUNTER — Encounter: Payer: Self-pay | Admitting: Physical Therapy

## 2023-09-23 ENCOUNTER — Ambulatory Visit: Admitting: Occupational Therapy

## 2023-09-23 ENCOUNTER — Ambulatory Visit

## 2023-09-23 DIAGNOSIS — R2681 Unsteadiness on feet: Secondary | ICD-10-CM

## 2023-09-23 DIAGNOSIS — R278 Other lack of coordination: Secondary | ICD-10-CM

## 2023-09-23 DIAGNOSIS — M6281 Muscle weakness (generalized): Secondary | ICD-10-CM

## 2023-09-23 DIAGNOSIS — R41840 Attention and concentration deficit: Secondary | ICD-10-CM

## 2023-09-23 DIAGNOSIS — R471 Dysarthria and anarthria: Secondary | ICD-10-CM

## 2023-09-23 DIAGNOSIS — R2689 Other abnormalities of gait and mobility: Secondary | ICD-10-CM

## 2023-09-23 DIAGNOSIS — R296 Repeated falls: Secondary | ICD-10-CM

## 2023-09-23 DIAGNOSIS — R293 Abnormal posture: Secondary | ICD-10-CM

## 2023-09-23 DIAGNOSIS — R41841 Cognitive communication deficit: Secondary | ICD-10-CM

## 2023-09-23 DIAGNOSIS — R41842 Visuospatial deficit: Secondary | ICD-10-CM

## 2023-09-23 NOTE — Therapy (Signed)
 OUTPATIENT PHYSICAL THERAPY NEURO TREATMENT   Patient Name: [name] MRN: [id number] DOB:[birth year], 78 y.o., male Today's Date: 09/23/2023   PCP: Ronnald Nian, MD   REFERRING PROVIDER: Vladimir Faster, DO   END OF SESSION:  PT End of Session - 09/23/23 1019     Visit Number 5    Number of Visits 7    Date for PT Re-Evaluation 10/31/23    Authorization Type [employer] Medicare    PT Start Time 1019   handoff from ST   PT Stop Time 1058    PT Time Calculation (min) 39 min    Equipment Utilized During Treatment Gait belt    Activity Tolerance Patient tolerated treatment well    Behavior During Therapy WFL for tasks assessed/performed             Past Medical History:  Diagnosis Date   Acquired hallux rigidus, right 03/25/2023   Acquired trigger finger of left middle finger 04/22/2019   Acquired trigger finger of right middle finger 04/22/2019   ADHD, predominantly hyperactive type    Allergic rhinitis due to pollen 08/08/2010   Allergy vaccine 1:50,000 03/25/08; 1:10 03/27/09 > 1:2 GH >> DC'd 01/04/2015 for observation        Anemia    takes Fe -    Anginal pain    Ankylosing spondylitis 10/09/2018   Arthritis    Atherosclerosis of aorta 08/30/2020   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 Donnie Aho   Chronic asthma 05/17/2011   Chronic low back pain with sciatica 11/14/2021   Chronic pain 11/21/2016   COPD (chronic obstructive pulmonary disease)    Dizziness 12/24/2021   Early satiety 09/11/2021   Erectile dysfunction    Esophageal motility disorder 09/11/2021   Generalized anxiety disorder    GERD (gastroesophageal reflux disease)    History of benign positional vertigo    History of colonic polyps 09/11/2021   History of kidney stones    Hoarseness 09/11/2021   Hyperlipidemia    Hypertension    Hypogonadism male    prior use on Testim   Insomnia    Iron deficiency 09/11/2021   Left ventricular dysfunction 10/12/2018    Leg pain, left 11/14/2021   Lumbar disc disease    Major depressive disorder    Mild concentric left ventricular hypertrophy (LVH)    Muscle spasm 11/14/2021   Old inferior myocardial infarction 1995   OSA (obstructive sleep apnea) 11/12/2022   NPSG 10/21/22- AHI 28.7/ hr, desat to 89%/Mean 93%, body weight 165 lbs     Pain in both feet 08/10/2021   Peripheral vascular disease 12/25/2021   Pneumonia    Presbycusis of both ears 04/23/2018   Shortness of breath dyspnea    Slow transit constipation 09/11/2021   Spinal cord stimulator status 11/14/2021   Spondylolisthesis of lumbar region 02/28/2015   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 Donnie Aho   CHEILECTOMY  10/16/2011   Procedure: CHEILECTOMY;  Surgeon: Sherri Rad, MD;  Location: [facility];  Service: Orthopedics;  Laterality: Right;   CHOLECYSTECTOMY     COLONOSCOPY     CORONARY ANGIOPLASTY     1995 after  MI   CORONARY ANGIOPLASTY WITH STENT PLACEMENT     1999    ESOPHAGOGASTRODUODENOSCOPY  02/18/2012   Procedure: ESOPHAGOGASTRODUODENOSCOPY (EGD);  Surgeon: Brice Campi, MD;  Location: [facility];  Service: Endoscopy;  Laterality: N/A;   FOOT SURGERY     joint scraped, arthritis right foot   HERNIA REPAIR     LUMBAR DISC SURGERY     x2   right index finger  mass removed- arthritis     Dr. Aloha Arnold   [facility]  Right 05/18/2021   medial toe consistent with callus   SKIN BIOPSY Left 05/17/2021   actinic keratosis   SPINAL CORD STIMULATOR BATTERY EXCHANGE N/A 10/11/2021   Procedure: SPINAL CORD STIMULATOR BATTERY EXCHANGE;  Surgeon: Mort Ards, MD;  Location: [facility] OR;  Service: Orthopedics;  Laterality: N/A;  60 mins   SPINAL CORD STIMULATOR  INSERTION N/A 11/21/2016   Procedure: LUMBAR SPINAL CORD STIMULATOR INSERTION;  Surgeon: Mort Ards, MD;  Location: [facility] OR;  Service: Orthopedics;  Laterality: N/A;  Requests 2.5 hrs   TONSILLECTOMY     1953   UPPER GI ENDOSCOPY  [id number]   VASECTOMY     1980   Patient Active Problem List   Diagnosis Date Noted   Attention or concentration deficit [R41.840] 05/30/2023   MCI (mild cognitive impairment) 05/30/2023   Generalized anxiety disorder    Acquired hallux rigidus, right 03/25/2023   OSA (obstructive sleep apnea) 11/12/2022   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   Peripheral vascular disease 12/25/2021   Dizziness 12/24/2021   Leg pain, left 11/14/2021   Chronic low back pain with sciatica 11/14/2021   Spinal cord stimulator status 11/14/2021   Muscle spasm 11/14/2021   Slow transit constipation 09/11/2021   Early satiety 09/11/2021   Hoarseness 09/11/2021   Iron deficiency 09/11/2021   History of colonic polyps 09/11/2021   Weight decreased 09/11/2021   Esophageal motility disorder 09/11/2021   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   Pain in both feet 08/10/2021   Atherosclerosis of aorta 08/30/2020   Mild concentric left ventricular hypertrophy (LVH)    Hypogonadism male    History of kidney stones    Erectile dysfunction    COPD (chronic obstructive pulmonary disease)    Acquired trigger finger of left middle finger 04/22/2019   Acquired trigger finger of right middle finger 04/22/2019   Left ventricular dysfunction 10/12/2018   Ankylosing spondylitis 10/09/2018   Presbycusis of both ears 04/23/2018   Chronic pain 11/21/2016   Arthritis 10/15/2016   Spondylolisthesis of lumbar region 02/28/2015   Hyperlipidemia    Lumbar disc disease    Hypertension    CAD (coronary artery disease) 05/19/2012   GERD (gastroesophageal reflux disease)    Major depressive disorder    Chronic asthma 05/17/2011   Allergic rhinitis due to pollen 08/08/2010   History of benign positional  vertigo    ADHD, predominantly hyperactive type     ONSET DATE: 08/19/2023  REFERRING DIAG: G21.4 (ICD-10-CM) - Vascular parkinsonism (HCC)   THERAPY DIAG:  Unsteadiness on feet  Muscle weakness (generalized)  Abnormal posture  Other abnormalities of gait and mobility  Rationale for Evaluation and Treatment: Rehabilitation  SUBJECTIVE:  SUBJECTIVE STATEMENT: Pt reports feeling like he is blanking when he is playing the piano.   Pt accompanied by:  Self, spouse [name]    PERTINENT HISTORY: PMH: Vascular Parkinsonism (2024), CAD, anxiety, insomnia, depression, GERD, hypertension, lumbar disc disease with spinal cord stimulator, COPD, asthma, ADHD, chronic pain, chronic vertigo, MCI   PAIN:  Are you having pain? No  PRECAUTIONS: Fall Spinal cord stimulator   FALLS: Has patient fallen in last 6 months? No  LIVING ENVIRONMENT: Lives with: lives with their spouse Lives in: House/apartment Stairs: Yes: External: 3 in front and 5 in back steps; on right going up, on left going up, can reach both, and always uses back steps (where he has the most falls)  Also has 5 steps to get down to utility room with no handrails  Has following equipment at home: Walker - 2 wheeled  PLOF: Independent with community mobility with device Notices more difficulty with buttons   PATIENT GOALS: "I want to be 78 years old"  Wants to get up out of a chair without feeling unsteady   OBJECTIVE:  Note: Objective measures were completed at Evaluation unless otherwise noted.   COGNITION: Overall cognitive status: Impaired, pt with MCI    SENSATION: Pt reports occasional tingling in R foot, notes L foot will just hurt   COORDINATION: Heel to shin: slightly slower with RLE    POSTURE: rounded shoulders,  forward head, and posterior pelvic tilt  LOWER EXTREMITY ROM:    Decr knee extension AROM RLE > LLE   LOWER EXTREMITY MMT:    MMT Right Eval Left Eval  Hip flexion 5 5  Hip extension    Hip abduction 4 4  Hip adduction 5 5  Hip internal rotation    Hip external rotation    Knee flexion 5 5  Knee extension 4+ 4+  Ankle dorsiflexion 3- 4  Ankle plantarflexion    Ankle inversion    Ankle eversion    (Blank rows = not tested)  All tested In sitting   BED MOBILITY:  Pt reports more difficulty getting out of bed at night, pt does not give a specific answer of what is challenging about this   TRANSFERS: Assistive device utilized: None  Sit to stand: CGA Stand to sit: SBA and CGA  Pt needing to use BUE support to come to stand, decr anterior weight shift. In standing, pt with incr knee flexion. Pt with decr eccentric control when lowering to chair. Pt needs cues to fully back up to chair before sitting down.   Pt attempting to stand without UE support, pt does not scoot out far enough to edge of seated or have enough anterior weight shift, pt tries to rock and stand multiple times, but unable to come to stand   GAIT: Gait pattern: step through pattern, decreased step length- Right, decreased step length- Left, decreased stride length, decreased hip/knee flexion- Right, decreased hip/knee flexion- Left, decreased ankle dorsiflexion- Right, decreased ankle dorsiflexion- Left, shuffling, trunk flexed, poor foot clearance- Right, and poor foot clearance- Left Distance walked: Clinic distances Assistive device utilized: Walker - 2 wheeled Level of assistance: SBA and CGA Comments: With use of RW, pt with decr foot clearance with RLE and incr forward flexed posture while pushing RW anteriorly. Also when turning, needs cues to keep RW on the ground instead of picking it up when turning. Pt intermittently catching R>L foot on the floor   FUNCTIONAL TESTS:  5 times sit to stand: 16.5  seconds with BUE support from chair  Timed up and go (TUG): 21.1 seconds with RW  10 meter walk test: 16.3 seconds = 2.01 ft/sec                                                                                                                                  TREATMENT DATE:    Therapeutic Activity:  Reviewed using a RW at all times as pt reports having freezing episodes at home when he is not using a RW and to decr risk of falls and improve gait Went over freezing strategies as pt reports recent freezing episodes and reports that he was trying to take a step while having an episode  Focused on STOPPING, resetting/standing tall and taking a few breaths, and then rocking before taking a big step to continue ambulation again, reviewed this with pt's spouse as well. At end of session, pt able to verbalize that he needs to STOP first and reset before trying to take a step    GAIT: Gait pattern: step through pattern, decreased step length- Right, decreased step length- Left, decreased stride length, decreased hip/knee flexion- Right, decreased hip/knee flexion- Left, decreased ankle dorsiflexion- Right, decreased ankle dorsiflexion- Left, shuffling, trunk flexed, poor foot clearance- Right, and poor foot clearance- Left Distance walked: 115' x 1, plus additional clinic distances  Assistive device utilized: Walker - 2 wheeled Level of assistance: SBA and CGA Comments: With use of RW, pt with decr foot clearance with RLE and incr forward flexed posture while pushing RW anteriorly. Focused on standing tall, staying close to RW, and focusing on heel strike/foot clearance. Discussed walking with intent. When pt tries to communicate with therapist during gait, pt with incr forward posture and pushing RW anteriorly and also walking on toes. Discussed importance of focusing on the task of walking.   Ther Ex  SciFit multi-peaks level 4.0 for 8 minutes using BUE/BLEs for neural priming for reciprocal movement,  dynamic cardiovascular warmup and increased amplitude of stepping. Cued to work towards a 7/10 level of effort. Pt needs cues throughout to focus on larger amplitude movements and kicking legs out for knee extension. Cues to keep spm >90. Pt reporting 6/10 RPE at the end   NMR:   Sit <> stands from mat table: Initial reminder cues with focus on scooting out to the edge, tucking feet back, and getting nose over toes, pt performing without UE support, performed 10 reps at start of session and an additional 10 reps at the end of session. Cues for posture in standing and looking straight ahead   With 5 blaze pods on mirror on random hit setting, 1 in center and 2 on R/L in superior and lateral directions to work on upright posture, trunk rotation, reaching outside of BOS, visual scanning and reaction times. CGA provided at times by therapist. Intermittent cues to remind pt to reach across body to tap vs. Using ipsilateral  hand  Performed 1 bout of 1 minute: 23 hits on level ground Performed 2 bouts of 1 minute: 23 hits, 22 hits on air ex (spread out blaze pods further away for incr reaching and trunk rotation)  PATIENT EDUCATION: Education details: See therapeutic activity section above  Person educated: Patient and Spouse Education method: Medical illustrator Education comprehension: verbalized understanding, returned demonstration, and needs further education  HOME EXERCISE PROGRAM: Access Code: [facility] URL: [email or web address] Date: 09/08/2023 Prepared by: Sherlie Ban  Exercises - Sit to Stand  - 1-2 x daily - 5 x weekly - 2 sets - 10 reps  Seated PWR Moves  GOALS: Goals reviewed with patient? Yes  SHORT TERM GOALS: Target date: 09/22/2023  Pt will be independent with initial HEP for strengh, balance, walking in order to build upon functional gains made in therapy. Baseline: Goal status: PARTIALLY MET  2.  Pt and pt's spouse will verbalize freezing  strategies in order to decr risk of falls.  Baseline:  Goal status: MET   LONG TERM GOALS: Target date: 10/13/2023  Pt will be independent with final HEP for strengh, balance, walking in order to build upon functional gains made in therapy. Baseline:  Goal status: INITIAL  2.  Pt will improve 5x sit<>stand to less than or equal to 17 sec without UE support to demonstrate improved functional strength and transfer efficiency.   Baseline: 16.5 seconds with BUE support from chair  Goal status: INITIAL  3.  Pt will improve TUG time to 18 seconds  or less with RW in order to demo decrease fall risk.  Baseline: 21.1 seconds with RW Goal status: INITIAL  4.  Pt will improve gait speed with RW to at least 2.3 ft/sec for improved gait efficiency and step length in order to demo improved community mobility.   Baseline: 16.3 seconds = 2.01 ft/sec Goal status: INITIAL    ASSESSMENT:  CLINICAL IMPRESSION: Pt reporting having some freezing episodes over the weekend, so reviewed strategies for freezing with importance of STOPPING and resetting before rocking and trying to take a big step. Also educated for pt to use RW at all times as pt reports frequent freezing when not having an AD. Pt able to verbalize at end of session to stop first when having a freezing episode. Remainder of session focused on SciFit for larger amplitude movements and blaze pods with focusing on trunk rotations and reaching outside of BOS. Pt needs cues throughout session with gait with RW to stay close and take incr stride length. Pt does not respond well to cues as pt reverts back to shortened steps and pushing walker anteriorly, despite continued focus of working on this with PT. Will continue per POC.     OBJECTIVE IMPAIRMENTS: Abnormal gait, decreased activity tolerance, decreased balance, decreased cognition, decreased coordination, decreased endurance, decreased knowledge of use of DME, decreased mobility, difficulty  walking, decreased ROM, decreased strength, decreased safety awareness, impaired flexibility, impaired sensation, and postural dysfunction.   ACTIVITY LIMITATIONS: carrying, lifting, bending, standing, stairs, transfers, bed mobility, reach over head, and locomotion level  PARTICIPATION LIMITATIONS: driving, shopping, community activity, and yard work  PERSONAL FACTORS: Age, Behavior pattern, Fitness, Past/current experiences, Time since onset of injury/illness/exacerbation, and 3+ comorbidities: Vascular Parkinsonism (2024), CAD, anxiety, insomnia, depression, GERD, hypertension, lumbar disc disease with spinal cord stimulator, COPD, asthma, ADHD, chronic pain, chronic vertigo, MCI   are also affecting patient's functional outcome.   REHAB POTENTIAL: Fair due to cognition and progressive diagnosis  CLINICAL DECISION MAKING: Evolving/moderate complexity  EVALUATION COMPLEXITY: Moderate  PLAN:  PT FREQUENCY: 1-2x/week  PT DURATION: 8 weeks  PLANNED INTERVENTIONS: 97164- PT Re-evaluation, 97110-Therapeutic exercises, 97530- Therapeutic activity, 97112- Neuromuscular re-education, 97535- Self Care, 40981- Manual therapy, [id number]- Gait training, Patient/Family education, Balance training, Stair training, Vestibular training, and DME instructions  PLAN FOR NEXT SESSION: review seated PWR moves   work on balance, give walking program for home, work on sit <> stand transfers, freezing strategies    Se[place]ron [place], PT, DPT 09/23/2023, 11:00 AM

## 2023-09-23 NOTE — Therapy (Signed)
 OUTPATIENT OCCUPATIONAL THERAPY [facility]  Patient Name: [name] MRN: [id number] DOB:1946/03/17, 78 y.o., male Today's Date: 09/23/2023  PCP: Watson Hacking, MD REFERRING PROVIDER: Shirline Dover, DO  END OF SESSION:  OT End of Session - 09/23/23 1102     Visit Number 4    Number of Visits 16    Date for OT Re-Evaluation 11/01/23    Authorization Type [employer] MCR - Auth required    Progress Note Due on Visit 10    OT Start Time 1104    OT Stop Time 1145    OT Time Calculation (min) 41 min    Activity Tolerance Patient tolerated treatment well    Behavior During Therapy [facility] for tasks assessed/performed             Past Medical History:  Diagnosis Date   Acquired hallux rigidus, right 03/25/2023   Acquired trigger finger of left middle finger 04/22/2019   Acquired trigger finger of right middle finger 04/22/2019   ADHD, predominantly hyperactive type    Allergic rhinitis due to pollen 08/08/2010   Allergy vaccine 1:50,000 03/25/08; 1:10 03/27/09 > 1:2 GH >> DC'd 01/04/2015 for observation        Anemia    takes Fe -    Anginal pain    Ankylosing spondylitis 10/09/2018   Arthritis    Atherosclerosis of aorta 08/30/2020   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 Anastasia Balo   Chronic asthma 05/17/2011   Chronic low back pain with sciatica 11/14/2021   Chronic pain 11/21/2016   COPD (chronic obstructive pulmonary disease)    Dizziness 12/24/2021   Early satiety 09/11/2021   Erectile dysfunction    Esophageal motility disorder 09/11/2021   Generalized anxiety disorder    GERD (gastroesophageal reflux disease)    History of benign positional vertigo    History of colonic polyps 09/11/2021   History of kidney stones    Hoarseness 09/11/2021   Hyperlipidemia    Hypertension    Hypogonadism male    prior use on Testim   Insomnia    Iron deficiency 09/11/2021   Left ventricular dysfunction 10/12/2018   Leg pain, left  11/14/2021   Lumbar disc disease    Major depressive disorder    Mild concentric left ventricular hypertrophy (LVH)    Muscle spasm 11/14/2021   Old inferior myocardial infarction 1995   OSA (obstructive sleep apnea) 11/12/2022   NPSG 10/21/22- AHI 28.7/ hr, desat to 89%/Mean 93%, body weight 165 lbs     Pain in both feet 08/10/2021   Peripheral vascular disease 12/25/2021   Pneumonia    Presbycusis of both ears 04/23/2018   Shortness of breath dyspnea    Slow transit constipation 09/11/2021   Spinal cord stimulator status 11/14/2021   Spondylolisthesis of lumbar region 02/28/2015   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 Anastasia Balo   CHEILECTOMY  10/16/2011   Procedure: CHEILECTOMY;  Surgeon: Janifer Meigs, MD;  Location: [facility];  Service: Orthopedics;  Laterality: Right;   CHOLECYSTECTOMY     COLONOSCOPY     CORONARY ANGIOPLASTY     1995 after MI   CORONARY ANGIOPLASTY WITH  STENT PLACEMENT     1999    ESOPHAGOGASTRODUODENOSCOPY  02/18/2012   Procedure: ESOPHAGOGASTRODUODENOSCOPY (EGD);  Surgeon: Brice Campi, MD;  Location: [facility];  Service: Endoscopy;  Laterality: N/A;   FOOT SURGERY     joint scraped, arthritis right foot   HERNIA REPAIR     LUMBAR DISC SURGERY     x2   right index finger  mass removed- arthritis     Dr. Aloha Arnold   [facility]  Right 05/18/2021   medial toe consistent with callus   SKIN BIOPSY Left 05/17/2021   actinic keratosis   SPINAL CORD STIMULATOR BATTERY EXCHANGE N/A 10/11/2021   Procedure: SPINAL CORD STIMULATOR BATTERY EXCHANGE;  Surgeon: Mort Ards, MD;  Location: [facility] OR;  Service: Orthopedics;  Laterality: N/A;  60 mins   SPINAL CORD STIMULATOR INSERTION N/A  11/21/2016   Procedure: LUMBAR SPINAL CORD STIMULATOR INSERTION;  Surgeon: Mort Ards, MD;  Location: [facility] OR;  Service: Orthopedics;  Laterality: N/A;  Requests 2.5 hrs   TONSILLECTOMY     1953   UPPER GI ENDOSCOPY  [id number]   VASECTOMY     1980   Patient Active Problem List   Diagnosis Date Noted   Attention or concentration deficit [R41.840] 05/30/2023   MCI (mild cognitive impairment) 05/30/2023   Generalized anxiety disorder    Acquired hallux rigidus, right 03/25/2023   OSA (obstructive sleep apnea) 11/12/2022   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   Peripheral vascular disease 12/25/2021   Dizziness 12/24/2021   Leg pain, left 11/14/2021   Chronic low back pain with sciatica 11/14/2021   Spinal cord stimulator status 11/14/2021   Muscle spasm 11/14/2021   Slow transit constipation 09/11/2021   Early satiety 09/11/2021   Hoarseness 09/11/2021   Iron deficiency 09/11/2021   History of colonic polyps 09/11/2021   Weight decreased 09/11/2021   Esophageal motility disorder 09/11/2021   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   Pain in both feet 08/10/2021   Atherosclerosis of aorta 08/30/2020   Mild concentric left ventricular hypertrophy (LVH)    Hypogonadism male    History of kidney stones    Erectile dysfunction    COPD (chronic obstructive pulmonary disease)    Acquired trigger finger of left middle finger 04/22/2019   Acquired trigger finger of right middle finger 04/22/2019   Left ventricular dysfunction 10/12/2018   Ankylosing spondylitis 10/09/2018   Presbycusis of both ears 04/23/2018   Chronic pain 11/21/2016   Arthritis 10/15/2016   Spondylolisthesis of lumbar region 02/28/2015   Hyperlipidemia    Lumbar disc disease    Hypertension    CAD (coronary artery disease) 05/19/2012   GERD (gastroesophageal reflux disease)    Major depressive disorder    Chronic asthma 05/17/2011   Allergic rhinitis due to pollen 08/08/2010   History of benign positional vertigo    ADHD,  predominantly hyperactive type     ONSET DATE: 08/19/2023 (referral date - based off screen recommendations)  REFERRING DIAG: Parkinson's Disease R41.3 (ICD-10-CM) - Memory impairment  THERAPY DIAG:  Other lack of coordination  Muscle weakness (generalized)  Visuospatial deficit  Attention and concentration deficit  Repeated falls  Rationale for Evaluation and Treatment: Rehabilitation  SUBJECTIVE:   SUBJECTIVE STATEMENT:  Pt reports he went to play the piano the other day and it seemed unfamiliar to him. States he has not practiced playing in [place].   Pt states he does not sit down to put on pants mostly due to pride. He wants to complete occupations as he has done  prior to PD dx/20-30 years ago. He acknowledges he has had many falls and has been lucky not to have any injuries as a result of this.   Pt accompanied by: self and wife  PERTINENT HISTORY: PD (diagnosed Spring 2024), ADHD, CAD, COPD, HTN, HLD, Dizziness, MI  PRECAUTIONS: Fall and Other: no driving, spinal nerve stimulator  WEIGHT BEARING RESTRICTIONS: No  PAIN:  Are you having pain? No  FALLS: Has patient fallen in last 6 months? 71fall  LIVING ENVIRONMENT: Lives with: lives with their spouse Carles Cheadle") and 3 cats Lives in:1 story home, 3-4 steps to enter (ramp on back). 4 steps down to washer/dryer but wife does Has following equipment at home: Otho Blitz - 2 wheeled  PLOF: Independent with basic ADLs, retired  PATIENT GOALS: get better  OBJECTIVE:  Note: Objective measures were completed at Evaluation unless otherwise noted.  HAND DOMINANCE: Right  ADLs: Overall ADLs: slower Transfers/ambulation related to ADLs: no device in house Eating: independent Grooming: independent UB Dressing: slower with jacket, difficulty and occasional assist with buttons LB Dressing: slower, assist for suspenders Toileting: independent Bathing: mod I - pt has grab bars Tub Shower transfers: walk in shower, but  currently using tub/shower combo Equipment: Grab bars  IADLs: Shopping: pt accompanies wife occasionally Light housekeeping: pt washes dishes Meal Prep: pt cooks some [employer] mobility: relies on wife for transportation. Has not driven in a year since diagnosis Medication management: pt/wife loads pillbox together, pt sometimes forgets to take meds Financial management: wife does Handwriting: 100% legible and Moderate micrographia  MOBILITY STATUS:  uses walker outside of home, mostly independent in home  POSTURE COMMENTS:  rounded shoulders and forward head   FUNCTIONAL OUTCOME MEASURES: (from screen on 08/19/23)  Fastening/unfastening 3 buttons: 50 sec Physical performance test: PPT#2 (simulated eating) 14 sec & PPT#4 (donning/doffing jacket): 22 sec  COORDINATION: (from screen on 08/19/23)  9 Hole Peg test: Right: 39 sec; Left: 40 sec Box and Blocks:  Right 37 blocks, Left 45 blocks Tremors: Resting and mild  UE ROM:  WFL   SENSATION: WFL  MUSCLE TONE: not tested  COGNITION: Overall cognitive status: Impaired and decreased memory . Pt also has ADHD (premorbid) and reports he's easily distracted.   OBSERVATIONS: Bradykinesia and Hypokinesia, stooped posture, shuffling feet, arthritis bilateral hands w/ MP ulnar drift                                                                                                                    TREATMENT :   Per subjective, pt was encouraged to practice playing the piano as part of his coordination HEP to increase familiarity/recall.   OT educated pt on adaptive strategies for buttoning. Pt able to return demonstration on shirt positioned on the table as well as the shirt he was wearing.   OT reviewed safe LB dress techniques placing emphasis on sitting to don pants and underwear to drastically reduce the risk of fall/injury.   PATIENT EDUCATION: Education details: see above Person educated:  Patient and Spouse Education  method: Explanation, Demonstration, Tactile cues, and Verbal cues Education comprehension: verbalized understanding, returned demonstration, verbal cues required, tactile cues required, and needs further education  HOME EXERCISE PROGRAM: 09/10/23: PWR! Hands, MP finger walking ex, coordination HEP 09/16/23: ADL strategies, handwriting strategies  GOALS: Goals reviewed with patient? Yes  SHORT TERM GOALS: Target date: 10/02/23  Independent with PD specific HEP (including finger walking ex to counteract MP ulnar drift)    Baseline: Goal status: IN PROGRESS  2.  Pt will verbalize understanding of ways to keep thinking skills sharp and ways to compensate for STM changes for medication management Baseline:  Goal status: INITIAL  3.  Pt will write a sentence with no significant decrease in size and maintain 100% legibility  Baseline:  Goal status: IN PROGRESS (using graph paper and writing in print)  4.  Pt will demonstrate increased ease with dressing as evidenced by decreasing PPT#4 (don/ doff jacket) to 18 secs or less  Baseline: 22 sec Goal status: IN PROGRESS  5.  Pt will verbalize understanding of adaptive strategies to increase ease with ADLS/IADLS   Baseline:  Goal status: IN PROGRESS  6.  Pt to improve RUE as evidenced by increasing Box & Blocks by 4 or more Baseline: 37 Goal status: INITIAL  LONG TERM GOALS: Target date: 11/01/23  Pt will verbalize understanding of ways to prevent future PD related complications and appropriate community resources prn  Baseline:  Goal status: INITIAL  2.  Pt will demonstrate improved ease with fastening buttons as evidenced by decreasing 3 button/unbutton time by 8 seconds or more Baseline: 50 sec Goal status: INITIAL  3.  Pt will demonstrate improved fine motor coordination for ADLs as evidenced by decreasing 9 hole peg test score for each hand and by 5 secs  Baseline: Rt = 39 sec, Lt = 40 sec.  Goal status: INITIAL  4.  Pt will write  a paragraph with no significant decrease in size and maintain 100% legibility  Baseline:  Goal status: INITIAL  5.  Pt will consistently don suspenders I'ly Baseline: needs assist Goal status: INITIAL  ASSESSMENT:  CLINICAL IMPRESSION: Patient able to return demonstration and verbalize understanding of recommendations as needed to improve independence and safety with ADL and IADL completion; however, he will require repeat education given his cognition/as needed to promote carryover.   PERFORMANCE DEFICITS: in functional skills including ADLs, IADLs, coordination, dexterity, proprioception, strength, Fine motor control, Gross motor control, mobility, balance, body mechanics, decreased knowledge of precautions, decreased knowledge of use of DME, vision, and UE functional use, cognitive skills including attention, memory, problem solving, and safety awareness, and psychosocial skills including coping strategies.   IMPAIRMENTS: are limiting patient from ADLs, IADLs, leisure, and social participation.   COMORBIDITIES:  may have co-morbidities  that affects occupational performance. Patient will benefit from skilled OT to address above impairments and improve overall function.  REHAB POTENTIAL: Good  PLAN:  OT FREQUENCY: 2x/week  OT DURATION: 8 weeks  PLANNED INTERVENTIONS: 97535 self care/ADL training, 86578 therapeutic exercise, 97530 therapeutic activity, 97112 neuromuscular re-education, 97140 Dwayne therapy, 97018 paraffin, 46962 fluidotherapy, 97010 moist heat, 97034 contrast bath, 97760 Orthotics management and training, 95284 Splinting (initial encounter), [id number] Subsequent splinting/medication, passive range of motion, functional mobility training, visual/perceptual remediation/compensation, energy conservation, coping strategies training, patient/family education, and DME and/or AE instructions  RECOMMENDED OTHER SERVICES: none at this time  CONSULTED AND AGREED WITH PLAN OF CARE:  Patient and family member/caregiver  PLAN FOR NEXT  SESSION: Review buttons, continue to reinforce strategies for ADLS including: sitting for LE dressing/bathing for safety, standing against counter to don/doff jacket, and writing in print with graph paper.   [employer]?  Issue memory strategies and ways to keep thinking skills sharp,  ? Bag exs for ADLS   Altamease Asters, OT 01/18/9146, 1:39 PM

## 2023-09-23 NOTE — Therapy (Signed)
 OUTPATIENT SPEECH LANGUAGE PATHOLOGY [facility]   Patient Name: [name] MRN: [id number] DOB:01-Dec-1945, 78 y.o., male Today's Date: 09/23/2023  PCP: Ronnald Nian, MD REFERRING PROVIDER: Vladimir Faster, DO  END OF SESSION:  End of Session - 09/23/23 0933     Visit Number 6    Number of Visits 17    Date for SLP Re-Evaluation 10/27/23    Authorization Type [employer] medicare    SLP Start Time 0933    SLP Stop Time  1015    SLP Time Calculation (min) 42 min    Activity Tolerance Patient tolerated treatment well            Past Medical History:  Diagnosis Date   Acquired hallux rigidus, right 03/25/2023   Acquired trigger finger of left middle finger 04/22/2019   Acquired trigger finger of right middle finger 04/22/2019   ADHD, predominantly hyperactive type    Allergic rhinitis due to pollen 08/08/2010   Allergy vaccine 1:50,000 03/25/08; 1:10 03/27/09 > 1:2 GH >> DC'd 01/04/2015 for observation        Anemia    takes Fe -    Anginal pain    Ankylosing spondylitis 10/09/2018   Arthritis    Atherosclerosis of aorta 08/30/2020   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 Donnie Aho   Chronic asthma 05/17/2011   Chronic low back pain with sciatica 11/14/2021   Chronic pain 11/21/2016   COPD (chronic obstructive pulmonary disease)    Dizziness 12/24/2021   Early satiety 09/11/2021   Erectile dysfunction    Esophageal motility disorder 09/11/2021   Generalized anxiety disorder    GERD (gastroesophageal reflux disease)    History of benign positional vertigo    History of colonic polyps 09/11/2021   History of kidney stones    Hoarseness 09/11/2021   Hyperlipidemia    Hypertension    Hypogonadism male    prior use on [employer]   Insomnia    Iron deficiency 09/11/2021   Left ventricular dysfunction 10/12/2018   Leg pain, left 11/14/2021   Lumbar disc disease    Major depressive disorder    Mild concentric left ventricular  hypertrophy (LVH)    Muscle spasm 11/14/2021   Old inferior myocardial infarction 1995   OSA (obstructive sleep apnea) 11/12/2022   NPSG 10/21/22- AHI 28.7/ hr, desat to 89%/Mean 93%, body weight 165 lbs     Pain in both feet 08/10/2021   Peripheral vascular disease 12/25/2021   Pneumonia    Presbycusis of both ears 04/23/2018   Shortness of breath dyspnea    Slow transit constipation 09/11/2021   Spinal cord stimulator status 11/14/2021   Spondylolisthesis of lumbar region 02/28/2015   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 Donnie Aho   CHEILECTOMY  10/16/2011   Procedure: CHEILECTOMY;  Surgeon: Sherri Rad, MD;  Location: [facility];  Service: Orthopedics;  Laterality: Right;   CHOLECYSTECTOMY     COLONOSCOPY     CORONARY ANGIOPLASTY     1995 after MI   CORONARY ANGIOPLASTY WITH STENT PLACEMENT     1999    ESOPHAGOGASTRODUODENOSCOPY  02/18/2012   Procedure: ESOPHAGOGASTRODUODENOSCOPY (EGD);  Surgeon: Molly Maduro  Stephanie Coup, MD;  Location: [facility];  Service: Endoscopy;  Laterality: N/A;   FOOT SURGERY     joint scraped, arthritis right foot   HERNIA REPAIR     LUMBAR DISC SURGERY     x2   right index finger  mass removed- arthritis     Dr. Amanda Pea   [facility]  Right 05/18/2021   medial toe consistent with callus   SKIN BIOPSY Left 05/17/2021   actinic keratosis   SPINAL CORD STIMULATOR BATTERY EXCHANGE N/A 10/11/2021   Procedure: SPINAL CORD STIMULATOR BATTERY EXCHANGE;  Surgeon: Venita Lick, MD;  Location: [facility];  Service: Orthopedics;  Laterality: N/A;  60 mins   SPINAL CORD STIMULATOR INSERTION N/A 11/21/2016   Procedure: LUMBAR SPINAL CORD STIMULATOR INSERTION;  Surgeon: Venita Lick, MD;  Location:  [facility] OR;  Service: Orthopedics;  Laterality: N/A;  Requests 2.5 hrs   TONSILLECTOMY     1953   UPPER GI ENDOSCOPY  [id number]   VASECTOMY     1980   Patient Active Problem List   Diagnosis Date Noted   Attention or concentration deficit [R41.840] 05/30/2023   MCI (mild cognitive impairment) 05/30/2023   Generalized anxiety disorder    Acquired hallux rigidus, right 03/25/2023   OSA (obstructive sleep apnea) 11/12/2022   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   Peripheral vascular disease 12/25/2021   Dizziness 12/24/2021   Leg pain, left 11/14/2021   Chronic low back pain with sciatica 11/14/2021   Spinal cord stimulator status 11/14/2021   Muscle spasm 11/14/2021   Slow transit constipation 09/11/2021   Early satiety 09/11/2021   Hoarseness 09/11/2021   Iron deficiency 09/11/2021   History of colonic polyps 09/11/2021   Weight decreased 09/11/2021   Esophageal motility disorder 09/11/2021   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   Pain in both feet 08/10/2021   Atherosclerosis of aorta 08/30/2020   Mild concentric left ventricular hypertrophy (LVH)    Hypogonadism male    History of kidney stones    Erectile dysfunction    COPD (chronic obstructive pulmonary disease)    Acquired trigger finger of left middle finger 04/22/2019   Acquired trigger finger of right middle finger 04/22/2019   Left ventricular dysfunction 10/12/2018   Ankylosing spondylitis 10/09/2018   Presbycusis of both ears 04/23/2018   Chronic pain 11/21/2016   Arthritis 10/15/2016   Spondylolisthesis of lumbar region 02/28/2015   Hyperlipidemia    Lumbar disc disease    Hypertension    CAD (coronary artery disease) 05/19/2012   GERD (gastroesophageal reflux disease)    Major depressive disorder    Chronic asthma 05/17/2011   Allergic rhinitis due to pollen 08/08/2010   History of benign positional vertigo    ADHD, predominantly hyperactive type     ONSET DATE: 08/19/2023 - referral date  REFERRING DIAG: R49.0  (ICD-10-CM) - Dysphonia  THERAPY DIAG:  Dysarthria and anarthria  Cognitive communication deficit  Rationale for Evaluation and Treatment: Rehabilitation  SUBJECTIVE:   SUBJECTIVE STATEMENT: "A little more frequent" - regarding practice with HEP Pt accompanied by: significant other [name]  PERTINENT HISTORY: Dr. Arbutus Leas has previously theorized a vascular parkinsonian presentation being more likely than idiopathic Parkinson's disease. Marthann Schiller is known to [place] from prior course of ST 11/29/22-02/04/23.   Neuropsych eval Nov 2024: Mr. Macken' pattern of performance is suggestive of impairment surrounding executive functioning. Performance variability was further exhibited across processing speed, complex attention, semantic fluency, and both encoding (i.e., learning) and delayed retrieval aspects of memory.  MCI             -suspect due to meds             -Neurocognitive testing with Dr. Milbert Coulter confirmed MCI in November, 2024.  He mentioned medications as a role in diminishing cognition.  PAIN:  Are you having pain? No  PATIENT REPORTED OUTCOME MEASURES (PROM): - Total 18 The Communicative Participation Item Bank        Does your condition interfere with... Pt Rating   ...talking with people you know 2   ...communicating when you need to say something quickly 3   ...talking with people you do not know 2   ...communicating when you are out in your community 1   ...asking questions in a conversation 2   ....communicating in a small group of people 2   ...having a long conversation 2   ...giving detailed infomrmation 3   ...getting your turn in a fast moving conversation 1   ...trying to persuade a friend or family member to see a different point of view 2  3= Not at all; 2=A little; 1=Quite a bit; 0=Very much                                                                                                                             TREATMENT DATE:  09/23/23: Pt shared increased  completion of HEP, practicing "more days" as opposed to every other day. SLP reinforced importance of daily practice and lead pt through Speak Out lesson #3 this date. Pt benefiting from occasional min-A and rare model to complete exercises with increased intensity and improved vocal quality exhibited. Averages this date: Sustained ah: 85 dB  Counting: 83 dB  Reading: 84 dB  Cognitive Exercise: 78 dB  Side conversation: 71 dB with intermittent cues  Pt expresses increased difficulty "finding my words" this session. SLP initiated pt education of descriptive strategies to aid word retrieval. SLP provided additional education on memory strategy of utilizing visual aids d/t reported concerns with recalling music notes. Pt endorses understanding.  09/19/23: Addressed dysarthria via Speak Out lesson #2. Pt required usual mod to max-A and modeling for accurate completion of therapy exercises. Pt averages the following:   Sustained ah: 84  Counting: 82  Reading: 79  Cognitive Exercise: 75 with usual mod verbal cues and gesture cues Minimal carryover for side comments or conversational speech though pt is able to repeat prior utterances with verbal cues for use of intent. Reviewed cough alternatives, as pt demonstrating coughing this date, endorsing strong cough sensation. Minimal effectiveness in utilizing alternatives this date. Will continue to address.   09/16/23: Exhibited initial throat clear with awareness of need to reduce habit. Pt able to carryover throat clear alternatives with rare min x4 additional opportunities this date. Recently received new Speak Out! Workbook. Has watched some online lessons with inconsistent completion. Re-educated recommendations for daily practice, in which pt verbalized understanding. Indicated feeling self-conscious completing  with wife and Librarian, academic. Assisted with re-framing assistance to aid confidence and carryover versus judgement. Re-educated Lesson #1. Usual model  and mod A provided to complete targeted tasks and maintain intent to achieve dB levels.  Pt averages the following volume levels:  Sustained AH: 86   Counting:84  Reading: 82  Cognitive Exercise: 79 with usual mod verbal cues and gesture cues Required frequent mod gestures and verbal cues, modeling for carryover of intent /volume when conversation was interjected during and after Speak Out! Lesson  Of note, pt instructed to bring own water bottle to next ST session to aid hydration.   09/10/23: [name] completed online home practice session on [employer] website. He reports his wife signed up for [employer] as well.  Targeted volume and intelligibility using Speak Out! Lesson E [employer]. Pt required usual min verbal cues, modeling, for volume and breath support.  Pt averages the following volume levels:  Sustained AH: 85 - cues required to keep volume and pitch steady and consistent  Counting:85  Reading (phrases): 80  Cognitive Exercise: 76 with usual mod verbal cues and gesture cues Required frequent mod gesture and verbal cues, modeling for carryover of intent /volume when conversation was interjected into Speak Out! Lesson   09/08/23: They have not signed up for e Library on [employer] ([employer]) website - Instructed them to do this - submitted [name]'s email to [employer] for access to [employer]. They both watched the required webinar on [employer] website. Targeted volume and intelligibility using Speak Out! Lesson April Week 1 on [employer] e Libary. Pt required occasional min  verbal cues, modeling, for volume and breath support. Pt averages the following volume levels:  Sustained AH: 85  Counting:80dB with occasional min cues for smooth and connected counting Pitch glides required usual mod modeling, gesture cues to achieve pitch change and prolong glides  Reading (phrases): 75  Cognitive Exercise: 72dB Required usual lmod verbal cues, modeling for carryover of intent /volume  answering simple questions following structured practice.  Completed CPIB PROM - See above    09/01/23 (eval day): Eval completed. Initiated training in compensatory strategies for slow processing - see Patient Instructions. Initiated Speak Out! Lesson one to review and instruct on HEP - With occasional min  A, [name] achieved the dB targets for warm ups, sustained Ah and counting. He required usual mod A to maintain volume on glides and conversation exercises. Demonstrated the Parkinson [employer] website and instructed [name] and [name] to watch the What is Parkinsons and Living with Parkinsons webinar. Introduced the on line home practice sessions as well. In conversation, [name] responded well to frequent mod verbal cues and visual cues to carryover intentional speech to average 70dB. Provided a log to track completion of HEP   PATIENT EDUCATION: Education details: see above Person educated: Patient and Spouse Education method: Explanation, Demonstration, Verbal cues, and Handouts Education comprehension: verbalized understanding, returned demonstration, verbal cues required, and needs further education  HOME EXERCISE PROGRAM: Speak Out! Therapy Program to be completed twice daily    GOALS: Goals reviewed with patient? Yes  SHORT TERM GOALS: Target date: 09/29/23  Pt will complete HEP for dysarthria with occasional min A Baseline: Goal status: ONGOING  2.  Pt will average 72dB 18/20 sentences Baseline:  Goal status: MET  3.  Pt will complete HEP twice a week 5/7 days outside of ST Baseline:  Goal status: ONGOING  4.  Pt will carryover 2 compensatory strategies  to support processing speed in conversation Baseline:  Goal status:ONGOING  5.  Pt will average 72dB over 8 minute conversation with occasional min A Baseline:  Goal status: ONGOING   LONG TERM GOALS: Target date: 10/27/23  Pt will complete HEP for dysarthria with mod I Baseline:  Goal status:ONGOING  2.  Pt  will ID and correct hoarse low volume with supervision cues Baseline:  Goal status: MET  3.  Pt will average 72dB over 15 minute conversation Baseline:  Goal status: ONGOING  4.  Pt will participate in 2 on line home practice sessions on the Parkinson [employer] website Baseline:  Goal status: ONGOING  5.  Pt will verbalize a plan/schedule to continue daily Speak Out! Practice after d/c from ST Baseline:  Goal status:ONGOING  6.  Pt will improve score on Communication Participation Item Bank Baseline: 18 Goal status: ONGOING  ASSESSMENT:  CLINICAL IMPRESSION: Patient is a 78 y.o. 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 [name] endorses low volume and hoarse voice. Continued pt education and instruction of Speak Out lessons and principles. [name] was easily stimulable for higher intensity voice which eliminate hoarse quality and improved intelligibility. I recommend skilled ST to maximize intelligibility for safety, independence and to reduce caregiver burden..   OBJECTIVE IMPAIRMENTS: Objective impairments include attention, memory, executive functioning, expressive language, and dysarthria. These impairments are limiting patient from effectively communicating at home and in community.Factors affecting potential to achieve goals and functional outcome are medical prognosis.. Patient will benefit from skilled SLP services to address above impairments and improve overall function.  REHAB POTENTIAL: Good  PLAN:  SLP FREQUENCY: 2x/week  SLP DURATION: 8 weeks  PLANNED INTERVENTIONS: Aspiration precaution training, Diet toleration management , Language facilitation, Environmental controls, Cueing hierachy, Cognitive reorganization, Internal/external aids, Functional tasks, Multimodal communication approach, SLP instruction and feedback, Compensatory strategies,  Patient/family education, and [id number] Treatment of speech (30 or 45 min)     Tamar Fairly, CCC-SLP 09/23/2023, 10:41 AM

## 2023-09-24 ENCOUNTER — Ambulatory Visit: Admitting: Occupational Therapy

## 2023-09-24 DIAGNOSIS — R41840 Attention and concentration deficit: Secondary | ICD-10-CM

## 2023-09-24 DIAGNOSIS — R296 Repeated falls: Secondary | ICD-10-CM

## 2023-09-24 DIAGNOSIS — M6281 Muscle weakness (generalized): Secondary | ICD-10-CM

## 2023-09-24 DIAGNOSIS — R471 Dysarthria and anarthria: Secondary | ICD-10-CM | POA: Diagnosis not present

## 2023-09-24 DIAGNOSIS — R278 Other lack of coordination: Secondary | ICD-10-CM

## 2023-09-24 NOTE — Therapy (Signed)
 OUTPATIENT OCCUPATIONAL THERAPY [facility]  Patient Name: [name] MRN: [id number] DOB:1945-10-17, 78 y.o., male Today's Date: 09/24/2023  PCP: Watson Hacking, MD REFERRING PROVIDER: Shirline Dover, DO  END OF SESSION:  OT End of Session - 09/24/23 1153     Visit Number 5    Number of Visits 16    Date for OT Re-Evaluation 11/01/23    Authorization Type [employer] MCR - Auth required    Progress Note Due on Visit 10    OT Start Time 1149    OT Stop Time 1230    OT Time Calculation (min) 41 min    Activity Tolerance Patient tolerated treatment well    Behavior During Therapy [facility] for tasks assessed/performed             Past Medical History:  Diagnosis Date   Acquired hallux rigidus, right 03/25/2023   Acquired trigger finger of left middle finger 04/22/2019   Acquired trigger finger of right middle finger 04/22/2019   ADHD, predominantly hyperactive type    Allergic rhinitis due to pollen 08/08/2010   Allergy  vaccine 1:50,000 03/25/08; 1:10 03/27/09 > 1:2 GH >> DC'd 01/04/2015 for observation        Anemia    takes Fe -    Anginal pain    Ankylosing spondylitis 10/09/2018   Arthritis    Atherosclerosis of aorta 08/30/2020   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 Anastasia Balo   Chronic asthma 05/17/2011   Chronic low back pain with sciatica 11/14/2021   Chronic pain 11/21/2016   COPD (chronic obstructive pulmonary disease)    Dizziness 12/24/2021   Early satiety 09/11/2021   Erectile dysfunction    Esophageal motility disorder 09/11/2021   Generalized anxiety disorder    GERD (gastroesophageal reflux disease)    History of benign positional vertigo    History of colonic polyps 09/11/2021   History of kidney stones    Hoarseness 09/11/2021   Hyperlipidemia    Hypertension    Hypogonadism male    prior use on Testim    Insomnia    Iron deficiency 09/11/2021   Left ventricular dysfunction 10/12/2018   Leg pain, left  11/14/2021   Lumbar disc disease    Major depressive disorder    Mild concentric left ventricular hypertrophy (LVH)    Muscle spasm 11/14/2021   Old inferior myocardial infarction 1995   OSA (obstructive sleep apnea) 11/12/2022   NPSG 10/21/22- AHI 28.7/ hr, desat to 89%/Mean 93%, body weight 165 lbs     Pain in both feet 08/10/2021   Peripheral vascular disease 12/25/2021   Pneumonia    Presbycusis of both ears 04/23/2018   Shortness of breath dyspnea    Slow transit constipation 09/11/2021   Spinal cord stimulator status 11/14/2021   Spondylolisthesis of lumbar region 02/28/2015   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 Anastasia Balo   CHEILECTOMY  10/16/2011   Procedure: CHEILECTOMY;  Surgeon: Janifer Meigs, MD;  Location: [facility];  Service: Orthopedics;  Laterality: Right;   CHOLECYSTECTOMY     COLONOSCOPY     CORONARY ANGIOPLASTY     1995 after MI   CORONARY ANGIOPLASTY WITH  STENT PLACEMENT     1999    ESOPHAGOGASTRODUODENOSCOPY  02/18/2012   Procedure: ESOPHAGOGASTRODUODENOSCOPY (EGD);  Surgeon: Brice Campi, MD;  Location: [facility];  Service: Endoscopy;  Laterality: N/A;   FOOT SURGERY     joint scraped, arthritis right foot   HERNIA REPAIR     LUMBAR DISC SURGERY     x2   right index finger  mass removed- arthritis     Dr. Aloha Arnold   [facility]  Right 05/18/2021   medial toe consistent with callus   SKIN BIOPSY Left 05/17/2021   actinic keratosis   SPINAL CORD STIMULATOR BATTERY EXCHANGE N/A 10/11/2021   Procedure: SPINAL CORD STIMULATOR BATTERY EXCHANGE;  Surgeon: Mort Ards, MD;  Location: [facility] OR;  Service: Orthopedics;  Laterality: N/A;  60 mins   SPINAL CORD STIMULATOR INSERTION N/A  11/21/2016   Procedure: LUMBAR SPINAL CORD STIMULATOR INSERTION;  Surgeon: Mort Ards, MD;  Location: [facility] OR;  Service: Orthopedics;  Laterality: N/A;  Requests 2.5 hrs   TONSILLECTOMY     1953   UPPER GI ENDOSCOPY  [id number]   VASECTOMY     1980   Patient Active Problem List   Diagnosis Date Noted   Attention or concentration deficit [R41.840] 05/30/2023   MCI (mild cognitive impairment) 05/30/2023   Generalized anxiety disorder    Acquired hallux rigidus, right 03/25/2023   OSA (obstructive sleep apnea) 11/12/2022   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   Peripheral vascular disease 12/25/2021   Dizziness 12/24/2021   Leg pain, left 11/14/2021   Chronic low back pain with sciatica 11/14/2021   Spinal cord stimulator status 11/14/2021   Muscle spasm 11/14/2021   Slow transit constipation 09/11/2021   Early satiety 09/11/2021   Hoarseness 09/11/2021   Iron deficiency 09/11/2021   History of colonic polyps 09/11/2021   Weight decreased 09/11/2021   Esophageal motility disorder 09/11/2021   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   Pain in both feet 08/10/2021   Atherosclerosis of aorta 08/30/2020   Mild concentric left ventricular hypertrophy (LVH)    Hypogonadism male    History of kidney stones    Erectile dysfunction    COPD (chronic obstructive pulmonary disease)    Acquired trigger finger of left middle finger 04/22/2019   Acquired trigger finger of right middle finger 04/22/2019   Left ventricular dysfunction 10/12/2018   Ankylosing spondylitis 10/09/2018   Presbycusis of both ears 04/23/2018   Chronic pain 11/21/2016   Arthritis 10/15/2016   Spondylolisthesis of lumbar region 02/28/2015   Hyperlipidemia    Lumbar disc disease    Hypertension    CAD (coronary artery disease) 05/19/2012   GERD (gastroesophageal reflux disease)    Major depressive disorder    Chronic asthma 05/17/2011   Allergic rhinitis due to pollen 08/08/2010   History of benign positional vertigo    ADHD,  predominantly hyperactive type     ONSET DATE: 08/19/2023 (referral date - based off screen recommendations)  REFERRING DIAG: Parkinson's Disease R41.3 (ICD-10-CM) - Memory impairment  THERAPY DIAG:  No diagnosis found.  Rationale for Evaluation and Treatment: Rehabilitation  SUBJECTIVE:   SUBJECTIVE STATEMENT:  Pt reports he likes to play card games.   Pt accompanied by: self and wife, [name]  PERTINENT HISTORY: PD (diagnosed Spring 2024), ADHD, CAD, COPD, HTN, HLD, Dizziness, MI  PRECAUTIONS: Fall and Other: no driving, spinal nerve stimulator  WEIGHT BEARING RESTRICTIONS: No  PAIN:  Are you having pain? No  FALLS: Has patient fallen in last 6 months? 57fall  LIVING ENVIRONMENT:  Lives with: lives with their spouse Carles Cheadle") and 3 cats Lives in:1 story home, 3-4 steps to enter (ramp on back). 4 steps down to washer/dryer but wife does Has following equipment at home: Otho Blitz - 2 wheeled  PLOF: Independent with basic ADLs, retired  PATIENT GOALS: get better  OBJECTIVE:  Note: Objective measures were completed at Evaluation unless otherwise noted.  HAND DOMINANCE: Right  ADLs: Overall ADLs: slower Transfers/ambulation related to ADLs: no device in house Eating: independent Grooming: independent UB Dressing: slower with jacket, difficulty and occasional assist with buttons LB Dressing: slower, assist for suspenders Toileting: independent Bathing: mod I - pt has grab bars Tub Shower transfers: walk in shower, but currently using tub/shower combo Equipment: Grab bars  IADLs: Shopping: pt accompanies wife occasionally Light housekeeping: pt washes dishes Meal Prep: pt cooks some [employer] mobility: relies on wife for transportation. Has not driven in a year since diagnosis Medication management: pt/wife loads pillbox together, pt sometimes forgets to take meds Financial management: wife does Handwriting: 100% legible and Moderate micrographia  MOBILITY  STATUS:  uses walker outside of home, mostly independent in home  POSTURE COMMENTS:  rounded shoulders and forward head   FUNCTIONAL OUTCOME MEASURES: (from screen on 08/19/23)  Fastening/unfastening 3 buttons: 50 sec Physical performance test: PPT#2 (simulated eating) 14 sec & PPT#4 (donning/doffing jacket): 22 sec  COORDINATION: (from screen on 08/19/23)  9 Hole Peg test: Right: 39 sec; Left: 40 sec Box and Blocks:  Right 37 blocks, Left 45 blocks Tremors: Resting and mild  UE ROM:  WFL   SENSATION: WFL  MUSCLE TONE: not tested  COGNITION: Overall cognitive status: Impaired and decreased memory . Pt also has ADHD (premorbid) and reports he's easily distracted.   OBSERVATIONS: Bradykinesia and Hypokinesia, stooped posture, shuffling feet, arthritis bilateral hands w/ MP ulnar drift                                                                                                                    TREATMENT :   OT discussed bathroom safety including sitting down to wash legs and feet as pt was propping himself up against the shower wall and crossing legs to do so. OT explained how this compromises his balance and puts him at a high risk of falls/injury. OT recommended that pt use BSC and long handled brush or sponge. Additionally, per pt and spouse description of bathroom, would also recommend installment of additional grab bar to outside of pt's shower. He was discouraged to hold onto door knobs or towel racks.   OT educated pt on table top play of Solitaire for BUE ROM, coordination, visual processing, scanning, and sequencing. Pt required minimal to mod cues for proper play.   PATIENT EDUCATION: Education details: see above Person educated: Patient and Spouse Education method: Explanation, Demonstration, Tactile cues, and Verbal cues Education comprehension: verbalized understanding, returned demonstration, verbal cues required, tactile cues required, and needs further  education  HOME EXERCISE PROGRAM: 09/10/23: PWR! Hands, MP finger walking ex,  coordination HEP 09/16/23: ADL strategies, handwriting strategies  GOALS: Goals reviewed with patient? Yes  SHORT TERM GOALS: Target date: 10/02/23  Independent with PD specific HEP (including finger walking ex to counteract MP ulnar drift)    Baseline: Goal status: IN PROGRESS  2.  Pt will verbalize understanding of ways to keep thinking skills sharp and ways to compensate for STM changes for medication management Baseline:  Goal status: INITIAL  3.  Pt will write a sentence with no significant decrease in size and maintain 100% legibility  Baseline:  Goal status: IN PROGRESS (using graph paper and writing in print)  4.  Pt will demonstrate increased ease with dressing as evidenced by decreasing PPT#4 (don/ doff jacket) to 18 secs or less  Baseline: 22 sec Goal status: IN PROGRESS  5.  Pt will verbalize understanding of adaptive strategies to increase ease with ADLS/IADLS   Baseline:  Goal status: IN PROGRESS  6.  Pt to improve RUE as evidenced by increasing Box & Blocks by 4 or more Baseline: 37 Goal status: INITIAL  LONG TERM GOALS: Target date: 11/01/23  Pt will verbalize understanding of ways to prevent future PD related complications and appropriate community resources prn  Baseline:  Goal status: INITIAL  2.  Pt will demonstrate improved ease with fastening buttons as evidenced by decreasing 3 button/unbutton time by 8 seconds or more Baseline: 50 sec Goal status: INITIAL  3.  Pt will demonstrate improved fine motor coordination for ADLs as evidenced by decreasing 9 hole peg test score for each hand and by 5 secs  Baseline: Rt = 39 sec, Lt = 40 sec.  Goal status: INITIAL  4.  Pt will write a paragraph with no significant decrease in size and maintain 100% legibility  Baseline:  Goal status: INITIAL  5.  Pt will consistently don suspenders I'ly Baseline: needs assist Goal status:  INITIAL  ASSESSMENT:  CLINICAL IMPRESSION: Patient demonstrating good understanding of therapeutic activity today as needed to help keep thinking skills sharp with improved ROM and coordination. Will continue to benefit from repeat safety education.  PERFORMANCE DEFICITS: in functional skills including ADLs, IADLs, coordination, dexterity, proprioception, strength, Fine motor control, Gross motor control, mobility, balance, body mechanics, decreased knowledge of precautions, decreased knowledge of use of DME, vision, and UE functional use, cognitive skills including attention, memory, problem solving, and safety awareness, and psychosocial skills including coping strategies.   IMPAIRMENTS: are limiting patient from ADLs, IADLs, leisure, and social participation.   COMORBIDITIES:  may have co-morbidities  that affects occupational performance. Patient will benefit from skilled OT to address above impairments and improve overall function.  REHAB POTENTIAL: Good  PLAN:  OT FREQUENCY: 2x/week  OT DURATION: 8 weeks  PLANNED INTERVENTIONS: 97535 self care/ADL training, 16109 therapeutic exercise, 97530 therapeutic activity, 97112 neuromuscular re-education, 97140 manual therapy, 97018 paraffin, 60454 fluidotherapy, 97010 moist heat, 97034 contrast bath, 97760 Orthotics management and training, 09811 Splinting (initial encounter), [id number] Subsequent splinting/medication, passive range of motion, functional mobility training, visual/perceptual remediation/compensation, energy conservation, coping strategies training, patient/family education, and DME and/or AE instructions  RECOMMENDED OTHER SERVICES: none at this time  CONSULTED AND AGREED WITH PLAN OF CARE: Patient and family member/caregiver  PLAN FOR NEXT SESSION: standing against counter to don/doff jacket, and writing in print with graph paper.   Review [employer] Solitaire PRN  Issue memory strategies and ways to keep thinking skills sharp,  ?  Bag exs for ADLS   Altamease Asters, OT 02/22/7828, 11:55 AM

## 2023-09-29 NOTE — Therapy (Unsigned)
 OUTPATIENT SPEECH LANGUAGE PATHOLOGY [facility]   Patient Name: [name] MRN: [id number] DOB:[birth year], 78 y.o., male Today's Date: 09/30/2023  PCP: Watson Hacking, MD REFERRING PROVIDER: Shirline Dover, DO  END OF SESSION:  End of Session - 09/30/23 0931     Visit Number 7    Number of Visits 17    Date for SLP Re-Evaluation 10/27/23    Authorization Type [employer] medicare    Progress Note Due on Visit 10    SLP Start Time 0932    SLP Stop Time  1015    SLP Time Calculation (min) 43 min    Activity Tolerance Patient tolerated treatment well             Past Medical History:  Diagnosis Date   Acquired hallux rigidus, right 03/25/2023   Acquired trigger finger of left middle finger 04/22/2019   Acquired trigger finger of right middle finger 04/22/2019   ADHD, predominantly hyperactive type    Allergic rhinitis due to pollen 08/08/2010   Allergy  vaccine 1:50,000 03/25/08; 1:10 03/27/09 > 1:2 GH >> DC'd 01/04/2015 for observation        Anemia    takes Fe -    Anginal pain    Ankylosing spondylitis 10/09/2018   Arthritis    Atherosclerosis of aorta 08/30/2020   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 Anastasia Balo   Chronic asthma 05/17/2011   Chronic low back pain with sciatica 11/14/2021   Chronic pain 11/21/2016   COPD (chronic obstructive pulmonary disease)    Dizziness 12/24/2021   Early satiety 09/11/2021   Erectile dysfunction    Esophageal motility disorder 09/11/2021   Generalized anxiety disorder    GERD (gastroesophageal reflux disease)    History of benign positional vertigo    History of colonic polyps 09/11/2021   History of kidney stones    Hoarseness 09/11/2021   Hyperlipidemia    Hypertension    Hypogonadism male    prior use on Testim    Insomnia    Iron deficiency 09/11/2021   Left ventricular dysfunction 10/12/2018   Leg pain, left 11/14/2021   Lumbar disc disease    Major depressive disorder     Mild concentric left ventricular hypertrophy (LVH)    Muscle spasm 11/14/2021   Old inferior myocardial infarction 1995   OSA (obstructive sleep apnea) 11/12/2022   NPSG 10/21/22- AHI 28.7/ hr, desat to 89%/Mean 93%, body weight 165 lbs     Pain in both feet 08/10/2021   Peripheral vascular disease 12/25/2021   Pneumonia    Presbycusis of both ears 04/23/2018   Shortness of breath dyspnea    Slow transit constipation 09/11/2021   Spinal cord stimulator status 11/14/2021   Spondylolisthesis of lumbar region 02/28/2015   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 Anastasia Balo   CHEILECTOMY  10/16/2011   Procedure: CHEILECTOMY;  Surgeon: Janifer Meigs, MD;  Location: [facility];  Service: Orthopedics;  Laterality: Right;   CHOLECYSTECTOMY     COLONOSCOPY     CORONARY ANGIOPLASTY     1995 after MI   CORONARY ANGIOPLASTY WITH STENT PLACEMENT     1999    ESOPHAGOGASTRODUODENOSCOPY  02/18/2012   Procedure: ESOPHAGOGASTRODUODENOSCOPY (EGD);  Surgeon: Brice Campi, MD;  Location: [facility];  Service: Endoscopy;  Laterality: N/A;   FOOT SURGERY     joint scraped, arthritis right foot   HERNIA REPAIR     LUMBAR DISC SURGERY     x2   right index finger  mass removed- arthritis     Dr. Aloha Arnold   [facility]  Right 05/18/2021   medial toe consistent with callus   SKIN BIOPSY Left 05/17/2021   actinic keratosis   SPINAL CORD STIMULATOR BATTERY EXCHANGE N/A 10/11/2021   Procedure: SPINAL CORD STIMULATOR BATTERY EXCHANGE;  Surgeon: Mort Ards, MD;  Location: [facility];  Service: Orthopedics;  Laterality: N/A;  60 mins   SPINAL CORD STIMULATOR INSERTION N/A 11/21/2016   Procedure: LUMBAR SPINAL CORD STIMULATOR INSERTION;   Surgeon: Mort Ards, MD;  Location: [facility];  Service: Orthopedics;  Laterality: N/A;  Requests 2.5 hrs   TONSILLECTOMY     1953   UPPER GI ENDOSCOPY  [id number]   VASECTOMY     1980   Patient Active Problem List   Diagnosis Date Noted   Attention or concentration deficit [R41.840] 05/30/2023   MCI (mild cognitive impairment) 05/30/2023   Generalized anxiety disorder    Acquired hallux rigidus, right 03/25/2023   OSA (obstructive sleep apnea) 11/12/2022   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   Peripheral vascular disease 12/25/2021   Dizziness 12/24/2021   Leg pain, left 11/14/2021   Chronic low back pain with sciatica 11/14/2021   Spinal cord stimulator status 11/14/2021   Muscle spasm 11/14/2021   Slow transit constipation 09/11/2021   Early satiety 09/11/2021   Hoarseness 09/11/2021   Iron deficiency 09/11/2021   History of colonic polyps 09/11/2021   Weight decreased 09/11/2021   Esophageal motility disorder 09/11/2021   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   Pain in both feet 08/10/2021   Atherosclerosis of aorta 08/30/2020   Mild concentric left ventricular hypertrophy (LVH)    Hypogonadism male    History of kidney stones    Erectile dysfunction    COPD (chronic obstructive pulmonary disease)    Acquired trigger finger of left middle finger 04/22/2019   Acquired trigger finger of right middle finger 04/22/2019   Left ventricular dysfunction 10/12/2018   Ankylosing spondylitis 10/09/2018   Presbycusis of both ears 04/23/2018   Chronic pain 11/21/2016   Arthritis 10/15/2016   Spondylolisthesis of lumbar region 02/28/2015   Hyperlipidemia    Lumbar disc disease    Hypertension    CAD (coronary artery disease) 05/19/2012   GERD (gastroesophageal reflux disease)    Major depressive disorder    Chronic asthma 05/17/2011   Allergic rhinitis due to pollen 08/08/2010   History of benign positional vertigo    ADHD, predominantly hyperactive type     ONSET DATE: 08/19/2023 -  referral date  REFERRING DIAG: R49.0 (ICD-10-CM) - Dysphonia  THERAPY DIAG:  Dysarthria and anarthria  Cognitive communication deficit  Rationale for Evaluation and Treatment: Rehabilitation  SUBJECTIVE:   SUBJECTIVE STATEMENT: "A little more frequent" - regarding practice with HEP Pt accompanied by: significant other [name]  PERTINENT HISTORY: Dr. Winferd Hatter has previously theorized a vascular parkinsonian presentation being more likely than idiopathic Parkinson's disease. [name] is known to us  from prior course of ST 11/29/22-02/04/23.   Neuropsych eval Nov 2024: Mr. Capraro' pattern of performance is suggestive of impairment surrounding executive functioning. Performance variability was further exhibited across processing speed, complex attention, semantic fluency, and both encoding (i.e., learning) and delayed retrieval  aspects of memory.         MCI             -suspect due to meds             -Neurocognitive testing with Dr. Kitty Perkins confirmed MCI in November, 2024.  He mentioned medications as a role in diminishing cognition.  PAIN:  Are you having pain? No  PATIENT REPORTED OUTCOME MEASURES (PROM): - Total 18 The Communicative Participation Item Bank        Does your condition interfere with... Pt Rating   ...talking with people you know 2   ...communicating when you need to say something quickly 3   ...talking with people you do not know 2   ...communicating when you are out in your community 1   ...asking questions in a conversation 2   ....communicating in a small group of people 2   ...having a long conversation 2   ...giving detailed infomrmation 3   ...getting your turn in a fast moving conversation 1   ...trying to persuade a friend or family member to see a different point of view 2  3= Not at all; 2=A little; 1=Quite a bit; 0=Very much                                                                                                                             TREATMENT  DATE:  09/30/23: Enters with moderate hoarseness today. Wife reported persistent hoarseness over weekend despite reported completion of HEP. SLP inquired about seasonal allergies and potential straining during Speak Out! Program. Pt denied both. Instructed Lesson #5 this date. Usual model and consistent mod A provided to utilize intentional volume and projection to reduce hoarseness. In subsequent conversation, frequent mod/max A required to maintain intent and forward projection. Limited awareness of volume decay and hoarseness exhibited. Vocal quality did improve with SLP cues and further instruction of targeted techniques.   09/23/23: Pt shared increased completion of HEP, practicing "more days" as opposed to every other day. SLP reinforced importance of daily practice and lead pt through Speak Out lesson #3 this date. Pt benefiting from occasional min-A and rare model to complete exercises with increased intensity and improved vocal quality exhibited. Averages this date: Sustained ah: 85 dB  Counting: 83 dB  Reading: 84 dB  Cognitive Exercise: 78 dB  Side conversation: 71 dB with intermittent cues  Pt expresses increased difficulty "finding my words" this session. SLP initiated pt education of descriptive strategies to aid word retrieval. SLP provided additional education on memory strategy of utilizing visual aids d/t reported concerns with recalling music notes. Pt endorses understanding.  09/19/23: Addressed dysarthria via Speak Out lesson #2. Pt required usual mod to max-A and modeling for accurate completion of therapy exercises. Pt averages the following:   Sustained ah: 84  Counting: 82  Reading: 79  Cognitive Exercise: 75 with usual mod verbal cues and gesture cues Minimal carryover for side  comments or conversational speech though pt is able to repeat prior utterances with verbal cues for use of intent. Reviewed cough alternatives, as pt demonstrating coughing this date, endorsing strong  cough sensation. Minimal effectiveness in utilizing alternatives this date. Will continue to address.   09/16/23: Exhibited initial throat clear with awareness of need to reduce habit. Pt able to carryover throat clear alternatives with rare min x4 additional opportunities this date. Recently received new Speak Out! Workbook. Has watched some online lessons with inconsistent completion. Re-educated recommendations for daily practice, in which pt verbalized understanding. Indicated feeling self-conscious completing with wife and Librarian, academic. Assisted with re-framing assistance to aid confidence and carryover versus judgement. Re-educated Lesson #1. Usual model and mod A provided to complete targeted tasks and maintain intent to achieve dB levels.  Pt averages the following volume levels:  Sustained AH: 86   Counting:84  Reading: 82  Cognitive Exercise: 79 with usual mod verbal cues and gesture cues Required frequent mod gestures and verbal cues, modeling for carryover of intent /volume when conversation was interjected during and after Speak Out! Lesson  Of note, pt instructed to bring own water  bottle to next ST session to aid hydration.   09/10/23: Mitch completed online home practice session on [employer] website. He reports his wife signed up for [employer] as well.  Targeted volume and intelligibility using Speak Out! Lesson E [employer]. Pt required usual min verbal cues, modeling, for volume and breath support.  Pt averages the following volume levels:  Sustained AH: 85 - cues required to keep volume and pitch steady and consistent  Counting:85  Reading (phrases): 80  Cognitive Exercise: 76 with usual mod verbal cues and gesture cues Required frequent mod gesture and verbal cues, modeling for carryover of intent /volume when conversation was interjected into Speak Out! Lesson   09/08/23: They have not signed up for e Library on [employer] ([employer]) website -  Instructed them to do this - submitted [name]'s email to [employer] for access to [employer]. They both watched the required webinar on [employer] website. Targeted volume and intelligibility using Speak Out! Lesson April Week 1 on [employer] e Libary. Pt required occasional min  verbal cues, modeling, for volume and breath support. Pt averages the following volume levels:  Sustained AH: 85  Counting:80dB with occasional min cues for smooth and connected counting Pitch glides required usual mod modeling, gesture cues to achieve pitch change and prolong glides  Reading (phrases): 75  Cognitive Exercise: 72dB Required usual lmod verbal cues, modeling for carryover of intent /volume answering simple questions following structured practice.  Completed CPIB PROM - See above    09/01/23 (eval day): Eval completed. Initiated training in compensatory strategies for slow processing - see Patient Instructions. Initiated Speak Out! Lesson one to review and instruct on HEP - With occasional min  A, Mitch achieved the dB targets for warm ups, sustained Ah and counting. He required usual mod A to maintain volume on glides and conversation exercises. Demonstrated the Parkinson Voice Project website and instructed [name] and Carles Cheadle to watch the What is Parkinsons and Living with Parkinsons webinar. Introduced the on line home practice sessions as well. In conversation, [name] responded well to frequent mod verbal cues and visual cues to carryover intentional speech to average 70dB. Provided a log to track completion of HEP   PATIENT EDUCATION: Education details: see above Person educated: Patient and Spouse Education method: Explanation, Demonstration, Verbal cues, and Handouts  Education comprehension: verbalized understanding, returned demonstration, verbal cues required, and needs further education  HOME EXERCISE PROGRAM: Speak Out! Therapy Program to be completed twice daily    GOALS: Goals reviewed with patient? Yes  SHORT TERM  GOALS: Target date: 09/29/23  Pt will complete HEP for dysarthria with occasional min A Baseline: Goal status: NOT MET  2.  Pt will average 72dB 18/20 sentences Baseline:  Goal status: MET  3.  Pt will complete HEP twice a week 5/7 days outside of ST Baseline:  Goal status: PARTIALLY MET  4.  Pt will carryover 2 compensatory strategies to support processing speed in conversation Baseline:  Goal status: NO MET  5.  Pt will average 72dB over 8 minute conversation with occasional min A Baseline:  Goal status: PARTIALLY MET   LONG TERM GOALS: Target date: 10/27/23  Pt will complete HEP for dysarthria with mod I Baseline:  Goal status:ONGOING  2.  Pt will ID and correct hoarse low volume with supervision cues Baseline:  Goal status: MET  3.  Pt will average 72dB over 15 minute conversation Baseline:  Goal status: ONGOING  4.  Pt will participate in 2 on line home practice sessions on the Parkinson [employer] website Baseline:  Goal status: ONGOING  5.  Pt will verbalize a plan/schedule to continue daily Speak Out! Practice after d/c from ST Baseline:  Goal status:ONGOING  6.  Pt will improve score on Communication Participation Item Bank Baseline: 18 Goal status: ONGOING  ASSESSMENT:  CLINICAL IMPRESSION: Patient is a 78 y.o. 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 Mitch endorses low volume and hoarse voice. Continued pt education and instruction of Speak Out lessons and principles. [name] was easily stimulable for higher intensity voice which eliminate hoarse quality and improved intelligibility; however, frequent cues required to achieve and maintain targeted intensity and improved quality. I recommend skilled ST to maximize intelligibility for safety, independence and to reduce caregiver burden..   OBJECTIVE IMPAIRMENTS: Objective impairments  include attention, memory, executive functioning, expressive language, and dysarthria. These impairments are limiting patient from effectively communicating at home and in community.Factors affecting potential to achieve goals and functional outcome are medical prognosis.. Patient will benefit from skilled SLP services to address above impairments and improve overall function.  REHAB POTENTIAL: Good  PLAN:  SLP FREQUENCY: 2x/week  SLP DURATION: 8 weeks  PLANNED INTERVENTIONS: Aspiration precaution training, Diet toleration management , Language facilitation, Environmental controls, Cueing hierachy, Cognitive reorganization, Internal/external aids, Functional tasks, Multimodal communication approach, SLP instruction and feedback, Compensatory strategies, Patient/family education, and [id number] Treatment of speech (30 or 45 min)     Tamar Fairly, CCC-SLP 09/30/2023, 9:31 AM

## 2023-09-30 ENCOUNTER — Ambulatory Visit

## 2023-09-30 ENCOUNTER — Ambulatory Visit: Admitting: Occupational Therapy

## 2023-09-30 ENCOUNTER — Encounter: Payer: Self-pay | Admitting: Physical Therapy

## 2023-09-30 ENCOUNTER — Encounter: Payer: Self-pay | Admitting: Occupational Therapy

## 2023-09-30 ENCOUNTER — Ambulatory Visit: Admitting: Physical Therapy

## 2023-09-30 VITALS — BP 123/79 | HR 81

## 2023-09-30 DIAGNOSIS — R278 Other lack of coordination: Secondary | ICD-10-CM

## 2023-09-30 DIAGNOSIS — R41842 Visuospatial deficit: Secondary | ICD-10-CM

## 2023-09-30 DIAGNOSIS — M6281 Muscle weakness (generalized): Secondary | ICD-10-CM

## 2023-09-30 DIAGNOSIS — R296 Repeated falls: Secondary | ICD-10-CM

## 2023-09-30 DIAGNOSIS — R2689 Other abnormalities of gait and mobility: Secondary | ICD-10-CM

## 2023-09-30 DIAGNOSIS — R41840 Attention and concentration deficit: Secondary | ICD-10-CM

## 2023-09-30 DIAGNOSIS — R2681 Unsteadiness on feet: Secondary | ICD-10-CM

## 2023-09-30 DIAGNOSIS — R293 Abnormal posture: Secondary | ICD-10-CM

## 2023-09-30 DIAGNOSIS — R471 Dysarthria and anarthria: Secondary | ICD-10-CM | POA: Diagnosis not present

## 2023-09-30 DIAGNOSIS — R41841 Cognitive communication deficit: Secondary | ICD-10-CM

## 2023-09-30 DIAGNOSIS — M5459 Other low back pain: Secondary | ICD-10-CM

## 2023-09-30 NOTE — Patient Instructions (Signed)
 Bag Exercises:  Small trash bag or produce bag works best.  For all exercises, sit with big posture (sit up tall with head up) and use big movements. Perform the following exercises 1-2 times per day.  Hold bag in one hand. Stretch both arms/hands out to the side as big as you can. Then, pass bag from one hand to the other BEHIND you. Stretch arms back out big after each pass. Repeat 10 times. Hold bag in both hands in front of you with hands/arms shoulder length apart. Move bag behind your head. Repeat 10 times. Hold bag in both hands in front of you with hands/arms shoulder length apart. Lift leg and move bag completely under each foot and back. Repeat 10 times on each side.   Memory Compensation Strategies  Use "WARM" strategy. W= write it down A=  associate it R=  repeat it M=  make a mental picture  You can keep a Memory Notebook. Use a 3-ring notebook with sections for the following:  calendar, important names and phone numbers, medications, doctors' names/phone numbers, "to do list"/reminders, and a section to journal what you did each day  Use a calendar to write appointments down.  Write yourself a schedule for the day.  This can be placed on the calendar or in a separate section of the Memory Notebook.  Keeping a regular schedule can help memory.  Use medication organizer with sections for each day or morning/evening pills  You may need help loading it  Keep a basket, or pegboard by the door.   Place items that you need to take out with you in the basket or on the pegboard.  You may also want to include a message board for reminders.  Use sticky notes. Place sticky notes with reminders in a place where the task is performed.  For example:  "turn off the stove" placed by the stove, "lock the door" placed on the door at eye level, "take your medications" on the bathroom mirror or by the place where you normally take your medications  Use alarms, timers, and/or a reminder  app. Use while cooking to remind yourself to check on food or as a reminder to take your medicine, or as a reminder to make a call, or as a reminder to perform another task, etc.  Use a voice recorder app or small tape recorder to record important information and notes for yourself. Go back at the end of the day and listen to these.   Keeping Thinking Skills Sharp: 1. Jigsaw puzzles 2. Card/board games 3. Talking on the phone/social events 4. Lumosity.com 5. Online games 6. Word serches/crossword puzzles 7.  Logic puzzles 8. Aerobic exercise (stationary bike) 9. Eating balanced diet (fruits & veggies) 10. Drink water  11. Try something new--new recipe, hobby 12. Crafts 13. Do a variety of activities that are challenging 14.  Plan weekly meals and write a grocery list 15. Add cognitive activities to walking/exercising (think of animal/food/city with each letter of the alphabet, counting backwards, thinking of as many vegetables as you can, etc.).--Only do this  If safe (no freezing/falls).

## 2023-09-30 NOTE — Therapy (Signed)
 OUTPATIENT OCCUPATIONAL THERAPY [facility]  Patient Name: [name] MRN: [id number] DOB:01/14/1946, 78 y.o., male Today's Date: 09/30/2023  PCP: Watson Hacking, MD REFERRING PROVIDER: Shirline Dover, DO  END OF SESSION:  OT End of Session - 09/30/23 1019     Visit Number 6    Number of Visits 16    Date for OT Re-Evaluation 11/01/23    Authorization Type [employer] MCR - Auth required    Progress Note Due on Visit 10    OT Start Time 1018    OT Stop Time 1100    OT Time Calculation (min) 42 min    Activity Tolerance Patient tolerated treatment well    Behavior During Therapy WFL for tasks assessed/performed             Past Medical History:  Diagnosis Date   Acquired hallux rigidus, right 03/25/2023   Acquired trigger finger of left middle finger 04/22/2019   Acquired trigger finger of right middle finger 04/22/2019   ADHD, predominantly hyperactive type    Allergic rhinitis due to pollen 08/08/2010   Allergy  vaccine 1:50,000 03/25/08; 1:10 03/27/09 > 1:2 GH >> DC'd 01/04/2015 for observation        Anemia    takes Fe -    Anginal pain    Ankylosing spondylitis 10/09/2018   Arthritis    Atherosclerosis of aorta 08/30/2020   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 Anastasia Balo   Chronic asthma 05/17/2011   Chronic low back pain with sciatica 11/14/2021   Chronic pain 11/21/2016   COPD (chronic obstructive pulmonary disease)    Dizziness 12/24/2021   Early satiety 09/11/2021   Erectile dysfunction    Esophageal motility disorder 09/11/2021   Generalized anxiety disorder    GERD (gastroesophageal reflux disease)    History of benign positional vertigo    History of colonic polyps 09/11/2021   History of kidney stones    Hoarseness 09/11/2021   Hyperlipidemia    Hypertension    Hypogonadism male    prior use on Testim    Insomnia    Iron deficiency 09/11/2021   Left ventricular dysfunction 10/12/2018   Leg pain, left  11/14/2021   Lumbar disc disease    Major depressive disorder    Mild concentric left ventricular hypertrophy (LVH)    Muscle spasm 11/14/2021   Old inferior myocardial infarction 1995   OSA (obstructive sleep apnea) 11/12/2022   NPSG 10/21/22- AHI 28.7/ hr, desat to 89%/Mean 93%, body weight 165 lbs     Pain in both feet 08/10/2021   Peripheral vascular disease 12/25/2021   Pneumonia    Presbycusis of both ears 04/23/2018   Shortness of breath dyspnea    Slow transit constipation 09/11/2021   Spinal cord stimulator status 11/14/2021   Spondylolisthesis of lumbar region 02/28/2015   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 Anastasia Balo   CHEILECTOMY  10/16/2011   Procedure: CHEILECTOMY;  Surgeon: Janifer Meigs, MD;  Location: [facility];  Service: Orthopedics;  Laterality: Right;   CHOLECYSTECTOMY     COLONOSCOPY     CORONARY ANGIOPLASTY     1995 after MI   CORONARY ANGIOPLASTY WITH  STENT PLACEMENT     1999    ESOPHAGOGASTRODUODENOSCOPY  02/18/2012   Procedure: ESOPHAGOGASTRODUODENOSCOPY (EGD);  Surgeon: Brice Campi, MD;  Location: [facility];  Service: Endoscopy;  Laterality: N/A;   FOOT SURGERY     joint scraped, arthritis right foot   HERNIA REPAIR     LUMBAR DISC SURGERY     x2   right index finger  mass removed- arthritis     Dr. Aloha Arnold   [facility]  Right 05/18/2021   medial toe consistent with callus   SKIN BIOPSY Left 05/17/2021   actinic keratosis   SPINAL CORD STIMULATOR BATTERY EXCHANGE N/A 10/11/2021   Procedure: SPINAL CORD STIMULATOR BATTERY EXCHANGE;  Surgeon: Mort Ards, MD;  Location: [facility] OR;  Service: Orthopedics;  Laterality: N/A;  60 mins   SPINAL CORD STIMULATOR INSERTION N/A  11/21/2016   Procedure: LUMBAR SPINAL CORD STIMULATOR INSERTION;  Surgeon: Mort Ards, MD;  Location: [facility] OR;  Service: Orthopedics;  Laterality: N/A;  Requests 2.5 hrs   TONSILLECTOMY     1953   UPPER GI ENDOSCOPY  [id number]   VASECTOMY     1980   Patient Active Problem List   Diagnosis Date Noted   Attention or concentration deficit [R41.840] 05/30/2023   MCI (mild cognitive impairment) 05/30/2023   Generalized anxiety disorder    Acquired hallux rigidus, right 03/25/2023   OSA (obstructive sleep apnea) 11/12/2022   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   Peripheral vascular disease 12/25/2021   Dizziness 12/24/2021   Leg pain, left 11/14/2021   Chronic low back pain with sciatica 11/14/2021   Spinal cord stimulator status 11/14/2021   Muscle spasm 11/14/2021   Slow transit constipation 09/11/2021   Early satiety 09/11/2021   Hoarseness 09/11/2021   Iron deficiency 09/11/2021   History of colonic polyps 09/11/2021   Weight decreased 09/11/2021   Esophageal motility disorder 09/11/2021   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   Pain in both feet 08/10/2021   Atherosclerosis of aorta 08/30/2020   Mild concentric left ventricular hypertrophy (LVH)    Hypogonadism male    History of kidney stones    Erectile dysfunction    COPD (chronic obstructive pulmonary disease)    Acquired trigger finger of left middle finger 04/22/2019   Acquired trigger finger of right middle finger 04/22/2019   Left ventricular dysfunction 10/12/2018   Ankylosing spondylitis 10/09/2018   Presbycusis of both ears 04/23/2018   Chronic pain 11/21/2016   Arthritis 10/15/2016   Spondylolisthesis of lumbar region 02/28/2015   Hyperlipidemia    Lumbar disc disease    Hypertension    CAD (coronary artery disease) 05/19/2012   GERD (gastroesophageal reflux disease)    Major depressive disorder    Chronic asthma 05/17/2011   Allergic rhinitis due to pollen 08/08/2010   History of benign positional vertigo    ADHD,  predominantly hyperactive type     ONSET DATE: 08/19/2023 (referral date - based off screen recommendations)  REFERRING DIAG: Parkinson's Disease R41.3 (ICD-10-CM) - Memory impairment  THERAPY DIAG:  Muscle weakness (generalized)  Other lack of coordination  Attention and concentration deficit  Unsteadiness on feet  Abnormal posture  Visuospatial deficit  Rationale for Evaluation and Treatment: Rehabilitation  SUBJECTIVE:   SUBJECTIVE STATEMENT:  No new falls and no pain.   Pt accompanied by: self and wife, [name]  PERTINENT HISTORY: PD (diagnosed Spring 2024), ADHD, CAD, COPD, HTN, HLD, Dizziness, MI  PRECAUTIONS: Fall and Other: no driving, spinal nerve stimulator  WEIGHT BEARING RESTRICTIONS: No  PAIN:  Are you having pain? No  FALLS: Has patient fallen in last 6 months? 34fall  LIVING ENVIRONMENT: Lives with: lives with their spouse Carles Cheadle") and 3 cats Lives in:1 story home, 3-4 steps to enter (ramp on back). 4 steps down to washer/dryer but wife does Has following equipment at home: Otho Blitz - 2 wheeled  PLOF: Independent with basic ADLs, retired  PATIENT GOALS: get better  OBJECTIVE:  Note: Objective measures were completed at Evaluation unless otherwise noted.  HAND DOMINANCE: Right  ADLs: Overall ADLs: slower Transfers/ambulation related to ADLs: no device in house Eating: independent Grooming: independent UB Dressing: slower with jacket, difficulty and occasional assist with buttons LB Dressing: slower, assist for suspenders Toileting: independent Bathing: mod I - pt has grab bars Tub Shower transfers: walk in shower, but currently using tub/shower combo Equipment: Grab bars  IADLs: Shopping: pt accompanies wife occasionally Light housekeeping: pt washes dishes Meal Prep: pt cooks some [employer] mobility: relies on wife for transportation. Has not driven in a year since diagnosis Medication management: pt/wife loads pillbox together,  pt sometimes forgets to take meds Financial management: wife does Handwriting: 100% legible and Moderate micrographia  MOBILITY STATUS:  uses walker outside of home, mostly independent in home  POSTURE COMMENTS:  rounded shoulders and forward head   FUNCTIONAL OUTCOME MEASURES: (from screen on 08/19/23)  Fastening/unfastening 3 buttons: 50 sec Physical performance test: PPT#2 (simulated eating) 14 sec & PPT#4 (donning/doffing jacket): 22 sec  COORDINATION: (from screen on 08/19/23)  9 Hole Peg test: Right: 39 sec; Left: 40 sec Box and Blocks:  Right 37 blocks, Left 45 blocks Tremors: Resting and mild  UE ROM:  WFL   SENSATION: WFL  MUSCLE TONE: not tested  COGNITION: Overall cognitive status: Impaired and decreased memory . Pt also has ADHD (premorbid) and reports he's easily distracted.   OBSERVATIONS: Bradykinesia and Hypokinesia, stooped posture, shuffling feet, arthritis bilateral hands w/ MP ulnar drift                                                                                                                    TREATMENT :   Reviewed safety/fall prevention re: sitting for LB dressing and bathing. Verbally reviewed strategies for donning/doffing jacket  Pt issued bag HEP to increase ease with ADLS - see pt instructions for details. Pt demo each x 10 reps with min cues  Pt issued memory strategies and ways to keep thinking skills sharp and reviewed. See pt instructions for details.  Pt encouraged to keep playing piano and singing in choir at church.  Pt also reports beginning to paint again and encouraged him to continue doing  PATIENT EDUCATION: Education details: see above Person educated: Patient and Spouse Education method: Explanation, Demonstration, Tactile cues, and Verbal cues Education comprehension: verbalized understanding, returned demonstration, verbal cues required, tactile cues required, and needs further education  HOME EXERCISE PROGRAM: 09/10/23:  Grant [employer]! Hands, MP finger walking ex, coordination HEP 09/16/23: ADL strategies, handwriting strategies 09/30/23: bag ex's, memory strategies and  ways to keep thinking skills sharp  GOALS: Goals reviewed with patient? Yes  SHORT TERM GOALS: Target date: 10/02/23  Independent with PD specific HEP (including finger walking ex to counteract MP ulnar drift)    Baseline: Goal status: MET  2.  Pt will verbalize understanding of ways to keep thinking skills sharp and ways to compensate for STM changes for medication management Baseline:  Goal status: MET  3.  Pt will write a sentence with no significant decrease in size and maintain 100% legibility  Baseline:  Goal status: IN PROGRESS (using graph paper and writing in print)  4.  Pt will demonstrate increased ease with dressing as evidenced by decreasing PPT#4 (don/ doff jacket) to 18 secs or less  Baseline: 22 sec Goal status: IN PROGRESS  5.  Pt will verbalize understanding of adaptive strategies to increase ease with ADLS/IADLS   Baseline:  Goal status: IN PROGRESS  6.  Pt to improve RUE as evidenced by increasing Box & Blocks by 4 or more Baseline: 37 Goal status: INITIAL  LONG TERM GOALS: Target date: 11/01/23  Pt will verbalize understanding of ways to prevent future PD related complications and appropriate community resources prn  Baseline:  Goal status: INITIAL  2.  Pt will demonstrate improved ease with fastening buttons as evidenced by decreasing 3 button/unbutton time by 8 seconds or more Baseline: 50 sec Goal status: INITIAL  3.  Pt will demonstrate improved fine motor coordination for ADLs as evidenced by decreasing 9 hole peg test score for each hand and by 5 secs  Baseline: Rt = 39 sec, Lt = 40 sec.  Goal status: INITIAL  4.  Pt will write a paragraph with no significant decrease in size and maintain 100% legibility  Baseline:  Goal status: INITIAL  5.  Pt will consistently don suspenders I'ly Baseline: needs  assist Goal status: INITIAL  ASSESSMENT:  CLINICAL IMPRESSION: Patient demonstrating good understanding of therapeutic activity today as needed to help keep thinking skills sharp with improved ROM and coordination. Will continue to benefit from repeat safety education.  PERFORMANCE DEFICITS: in functional skills including ADLs, IADLs, coordination, dexterity, proprioception, strength, Fine motor control, Gross motor control, mobility, balance, body mechanics, decreased knowledge of precautions, decreased knowledge of use of DME, vision, and UE functional use, cognitive skills including attention, memory, problem solving, and safety awareness, and psychosocial skills including coping strategies.   IMPAIRMENTS: are limiting patient from ADLs, IADLs, leisure, and social participation.   COMORBIDITIES:  may have co-morbidities  that affects occupational performance. Patient will benefit from skilled OT to address above impairments and improve overall function.  REHAB POTENTIAL: Good  PLAN:  OT FREQUENCY: 2x/week  OT DURATION: 8 weeks  PLANNED INTERVENTIONS: 97535 self care/ADL training, 16109 therapeutic exercise, 97530 therapeutic activity, 97112 neuromuscular re-education, 97140 manual therapy, 97018 paraffin, 60454 fluidotherapy, 97010 moist heat, 97034 contrast bath, 97760 Orthotics management and training, 09811 Splinting (initial encounter), [id number] Subsequent splinting/medication, passive range of motion, functional mobility training, visual/perceptual remediation/compensation, energy conservation, coping strategies training, patient/family education, and DME and/or AE instructions  RECOMMENDED OTHER SERVICES: none at this time  CONSULTED AND AGREED WITH PLAN OF CARE: Patient and family member/caregiver  PLAN FOR NEXT SESSION: standing against counter to don/doff jacket, and writing in print with graph paper.   Review [employer]  Progress towards remaining STGs   Velinda Getting, OT 09/30/2023, 10:20 AM

## 2023-09-30 NOTE — Therapy (Signed)
 OUTPATIENT PHYSICAL THERAPY NEURO TREATMENT   Patient Name: [name] MRN: [id number] DOB:Mar 19, 1946, 78 y.o., male Today's Date: 09/30/2023   PCP: Watson Hacking, MD   REFERRING PROVIDER: Shirline Dover, DO   END OF SESSION:  PT End of Session - 09/30/23 1107     Visit Number 6    Number of Visits 7    Date for PT Re-Evaluation 10/31/23    Authorization Type [employer]    Progress Note Due on Visit 10    PT Start Time 1105    PT Stop Time 1150    PT Time Calculation (min) 45 min    Equipment Utilized During Treatment Gait belt    Activity Tolerance Patient tolerated treatment well    Behavior During Therapy WFL for tasks assessed/performed;Impulsive             Past Medical History:  Diagnosis Date   Acquired hallux rigidus, right 03/25/2023   Acquired trigger finger of left middle finger 04/22/2019   Acquired trigger finger of right middle finger 04/22/2019   ADHD, predominantly hyperactive type    Allergic rhinitis due to pollen 08/08/2010   Allergy  vaccine 1:50,000 03/25/08; 1:10 03/27/09 > 1:2 GH >> DC'd 01/04/2015 for observation        Anemia    takes Fe -    Anginal pain    Ankylosing spondylitis 10/09/2018   Arthritis    Atherosclerosis of aorta 08/30/2020   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 Anastasia Balo   Chronic asthma 05/17/2011   Chronic low back pain with sciatica 11/14/2021   Chronic pain 11/21/2016   COPD (chronic obstructive pulmonary disease)    Dizziness 12/24/2021   Early satiety 09/11/2021   Erectile dysfunction    Esophageal motility disorder 09/11/2021   Generalized anxiety disorder    GERD (gastroesophageal reflux disease)    History of benign positional vertigo    History of colonic polyps 09/11/2021   History of kidney stones    Hoarseness 09/11/2021   Hyperlipidemia    Hypertension    Hypogonadism male    prior use on Testim    Insomnia    Iron deficiency 09/11/2021   Left  ventricular dysfunction 10/12/2018   Leg pain, left 11/14/2021   Lumbar disc disease    Major depressive disorder    Mild concentric left ventricular hypertrophy (LVH)    Muscle spasm 11/14/2021   Old inferior myocardial infarction 1995   OSA (obstructive sleep apnea) 11/12/2022   NPSG 10/21/22- AHI 28.7/ hr, desat to 89%/Mean 93%, body weight 165 lbs     Pain in both feet 08/10/2021   Peripheral vascular disease 12/25/2021   Pneumonia    Presbycusis of both ears 04/23/2018   Shortness of breath dyspnea    Slow transit constipation 09/11/2021   Spinal cord stimulator status 11/14/2021   Spondylolisthesis of lumbar region 02/28/2015   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 Anastasia Balo   CHEILECTOMY  10/16/2011   Procedure: CHEILECTOMY;  Surgeon: Janifer Meigs, MD;  Location: [facility];  Service: Orthopedics;  Laterality: Right;   CHOLECYSTECTOMY     COLONOSCOPY     CORONARY ANGIOPLASTY  1995 after MI   CORONARY ANGIOPLASTY WITH STENT PLACEMENT     1999    ESOPHAGOGASTRODUODENOSCOPY  02/18/2012   Procedure: ESOPHAGOGASTRODUODENOSCOPY (EGD);  Surgeon: Brice Campi, MD;  Location: [facility];  Service: Endoscopy;  Laterality: N/A;   FOOT SURGERY     joint scraped, arthritis right foot   HERNIA REPAIR     LUMBAR DISC SURGERY     x2   right index finger  mass removed- arthritis     Dr. Aloha Arnold   [facility]  Right 05/18/2021   medial toe consistent with callus   SKIN BIOPSY Left 05/17/2021   actinic keratosis   SPINAL CORD STIMULATOR BATTERY EXCHANGE N/A 10/11/2021   Procedure: SPINAL CORD STIMULATOR BATTERY EXCHANGE;  Surgeon: Mort Ards, MD;  Location: [facility];  Service: Orthopedics;  Laterality: N/A;  60  mins   SPINAL CORD STIMULATOR INSERTION N/A 11/21/2016   Procedure: LUMBAR SPINAL CORD STIMULATOR INSERTION;  Surgeon: Mort Ards, MD;  Location: [facility];  Service: Orthopedics;  Laterality: N/A;  Requests 2.5 hrs   TONSILLECTOMY     1953   UPPER GI ENDOSCOPY  [id number]   VASECTOMY     1980   Patient Active Problem List   Diagnosis Date Noted   Attention or concentration deficit [R41.840] 05/30/2023   MCI (mild cognitive impairment) 05/30/2023   Generalized anxiety disorder    Acquired hallux rigidus, right 03/25/2023   OSA (obstructive sleep apnea) 11/12/2022   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   Peripheral vascular disease 12/25/2021   Dizziness 12/24/2021   Leg pain, left 11/14/2021   Chronic low back pain with sciatica 11/14/2021   Spinal cord stimulator status 11/14/2021   Muscle spasm 11/14/2021   Slow transit constipation 09/11/2021   Early satiety 09/11/2021   Hoarseness 09/11/2021   Iron deficiency 09/11/2021   History of colonic polyps 09/11/2021   Weight decreased 09/11/2021   Esophageal motility disorder 09/11/2021   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   Pain in both feet 08/10/2021   Atherosclerosis of aorta 08/30/2020   Mild concentric left ventricular hypertrophy (LVH)    Hypogonadism male    History of kidney stones    Erectile dysfunction    COPD (chronic obstructive pulmonary disease)    Acquired trigger finger of left middle finger 04/22/2019   Acquired trigger finger of right middle finger 04/22/2019   Left ventricular dysfunction 10/12/2018   Ankylosing spondylitis 10/09/2018   Presbycusis of both ears 04/23/2018   Chronic pain 11/21/2016   Arthritis 10/15/2016   Spondylolisthesis of lumbar region 02/28/2015   Hyperlipidemia    Lumbar disc disease    Hypertension    CAD (coronary artery disease) 05/19/2012   GERD (gastroesophageal reflux disease)    Major depressive disorder    Chronic asthma 05/17/2011   Allergic rhinitis due to pollen 08/08/2010    History of benign positional vertigo    ADHD, predominantly hyperactive type     ONSET DATE: 08/19/2023  REFERRING DIAG: G21.4 (ICD-10-CM) - Vascular parkinsonism (HCC)   THERAPY DIAG:  Muscle weakness (generalized)  Other lack of coordination  Unsteadiness on feet  Repeated falls  Other low back pain  Other abnormalities of gait and mobility  Rationale for Evaluation and Treatment: Rehabilitation  SUBJECTIVE:  SUBJECTIVE STATEMENT: Pt reports that he did use his walker over the weekend. Does not recall seated PWR! Moves exercises. Still primary concern is freezing episodes. Denies falls/near falls.   Pt accompanied by:  Self, spouse [name]    PERTINENT HISTORY: PMH: Vascular Parkinsonism (2024), CAD, anxiety, insomnia, depression, GERD, hypertension, lumbar disc disease with spinal cord stimulator, COPD, asthma, ADHD, chronic pain, chronic vertigo, MCI   PAIN:  Are you having pain? No  PRECAUTIONS: Fall Spinal cord stimulator   FALLS: Has patient fallen in last 6 months? No  LIVING ENVIRONMENT: Lives with: lives with their spouse Lives in: House/apartment Stairs: Yes: External: 3 in front and 5 in back steps; on right going up, on left going up, can reach both, and always uses back steps (where he has the most falls)  Also has 5 steps to get down to utility room with no handrails  Has following equipment at home: Walker - 2 wheeled  PLOF: Independent with community mobility with device Notices more difficulty with buttons   PATIENT GOALS: "I want to be 78 years old"  Wants to get up out of a chair without feeling unsteady   OBJECTIVE:  Note: Objective measures were completed at Evaluation unless otherwise noted.   COGNITION: Overall cognitive status: Impaired, pt with MCI     SENSATION: Pt reports occasional tingling in R foot, notes L foot will just hurt   COORDINATION: Heel to shin: slightly slower with RLE    POSTURE: rounded shoulders, forward head, and posterior pelvic tilt  LOWER EXTREMITY ROM:    Decr knee extension AROM RLE > LLE   LOWER EXTREMITY MMT:    MMT Right Eval Left Eval  Hip flexion 5 5  Hip extension    Hip abduction 4 4  Hip adduction 5 5  Hip internal rotation    Hip external rotation    Knee flexion 5 5  Knee extension 4+ 4+  Ankle dorsiflexion 3- 4  Ankle plantarflexion    Ankle inversion    Ankle eversion    (Blank rows = not tested)  All tested In sitting   BED MOBILITY:  Pt reports more difficulty getting out of bed at night, pt does not give a specific answer of what is challenging about this   TRANSFERS: Assistive device utilized: None  Sit to stand: CGA Stand to sit: SBA and CGA  Pt needing to use BUE support to come to stand, decr anterior weight shift. In standing, pt with incr knee flexion. Pt with decr eccentric control when lowering to chair. Pt needs cues to fully back up to chair before sitting down.   Pt attempting to stand without UE support, pt does not scoot out far enough to edge of seated or have enough anterior weight shift, pt tries to rock and stand multiple times, but unable to come to stand   GAIT: Gait pattern: step through pattern, decreased step length- Right, decreased step length- Left, decreased stride length, decreased hip/knee flexion- Right, decreased hip/knee flexion- Left, decreased ankle dorsiflexion- Right, decreased ankle dorsiflexion- Left, shuffling, trunk flexed, poor foot clearance- Right, and poor foot clearance- Left Distance walked: Clinic distances Assistive device utilized: Walker - 2 wheeled Level of assistance: SBA and CGA Comments: With use of RW, pt with decr foot clearance with RLE and incr forward flexed posture while pushing RW anteriorly. Also when turning,  needs cues to keep RW on the ground instead of picking it up when turning. Pt intermittently catching R>L  foot on the floor   FUNCTIONAL TESTS:  5 times sit to stand: 16.5 seconds with BUE support from chair  Timed up and go (TUG): 21.1 seconds with RW  10 meter walk test: 16.3 seconds = 2.01 ft/sec                                                                                                                                  TREATMENT DATE:   Vitals:   09/30/23 1119  BP: 123/79  Pulse: 81   Assessed seated on R UE  GAIT: Gait pattern: step through pattern, decreased step length- Right, decreased step length- Left, decreased stride length, decreased hip/knee flexion- Right, decreased hip/knee flexion- Left, decreased ankle dorsiflexion- Right, decreased ankle dorsiflexion- Left, shuffling, trunk flexed, poor foot clearance- Right, and poor foot clearance- Left Distance walked: clinic distances (~1 x 200 feet), 4 x 20 feet with cone weaving  Assistive device utilized: Environmental consultant - 2 wheeled Level of assistance: SBA and CGA Comments: Reviewed freezing strategies with 2WW, patient able to implement without prompting 2x during session but very poor carryover to end of session when walking out when episodes of festination noted, practiced cone weaving to work on turns and weight shift, improved with practice and cues to use U turn instead of picking walker up causing increased retropulsion, tendency for walker to weave to R, when patient begins talking to PT tendency to pick hand off walker and increase impulsivity and therefore lack of safety    NMR: Provided new printout of seated PWR! Moves, patient and spouse verbalize that they know what they are but have not been performing at home, reviewed importance for working on it for postural retraining, weight shift, etc Reviewed freezing strategies to reduce falls risk with emphasis on STOP as patient with tendency to focus on step resulting in  increased freezing/festination (see gait session for functional practice), patient with poor carryover from session to session Cued caregiver on verbal cues can provide to assist patient intermittently  Sit to stand from low mat with feet on wedge with 4lb med ball overhead throw to target on ground ~15 feet away for anticipatory balance reaction and anterior weight shift (CGA) Patient with tendency to throw ball to ground before target due to poor upright posture, increases velocity and power of throw with cueing increasing amplitude of movement, intermittent success with anterior weight shift Lateral step to the R with overhead ball throw for large amplitude movements x 3 tosses with external targets for weight shift and postural retraining (limited by time constraints of session) (CGA)  PATIENT EDUCATION: Education details: See above + HEP  Person educated: Patient and Spouse Education method: Medical illustrator Education comprehension: verbalized understanding, returned demonstration, and needs further education  HOME EXERCISE PROGRAM: Access Code: [facility] URL: [email or web address] Date: 09/08/2023 Prepared by: Jonathan Neighbor  Exercises - Sit to Stand  - 1-2 x daily - 5 x weekly -  2 sets - 10 reps  Seated PWR Moves  GOALS: Goals reviewed with patient? Yes  SHORT TERM GOALS: Target date: 09/22/2023  Pt will be independent with initial HEP for strengh, balance, walking in order to build upon functional gains made in therapy. Baseline: Goal status: PARTIALLY MET  2.  Pt and pt's spouse will verbalize freezing strategies in order to decr risk of falls.  Baseline:  Goal status: MET   LONG TERM GOALS: Target date: 10/13/2023  Pt will be independent with final HEP for strengh, balance, walking in order to build upon functional gains made in therapy. Baseline:  Goal status: INITIAL  2.  Pt will improve 5x sit<>stand to less than or equal to 17 sec  without UE support to demonstrate improved functional strength and transfer efficiency.   Baseline: 16.5 seconds with BUE support from chair  Goal status: INITIAL  3.  Pt will improve TUG time to 18 seconds  or less with RW in order to demo decrease fall risk.  Baseline: 21.1 seconds with RW Goal status: INITIAL  4.  Pt will improve gait speed with RW to at least 2.3 ft/sec for improved gait efficiency and step length in order to demo improved community mobility.   Baseline: 16.3 seconds = 2.01 ft/sec Goal status: INITIAL  ASSESSMENT:  CLINICAL IMPRESSION: Patient remains with poor carryover from session to session and is largely noncompliant with PT HEP. Reviewed functional work on transfers, gait training with rolling walker, freezing strategies, and HEP exercises. Patient demonstrates ongoing LE bracing on mat with use of wedge to cue anterior weight shift. Patient able to implement freezing strategies 2x without prompting but appears to have reduced carryover at end of session. Will continue per POC.     OBJECTIVE IMPAIRMENTS: Abnormal gait, decreased activity tolerance, decreased balance, decreased cognition, decreased coordination, decreased endurance, decreased knowledge of use of DME, decreased mobility, difficulty walking, decreased ROM, decreased strength, decreased safety awareness, impaired flexibility, impaired sensation, and postural dysfunction.   ACTIVITY LIMITATIONS: carrying, lifting, bending, standing, stairs, transfers, bed mobility, reach over head, and locomotion level  PARTICIPATION LIMITATIONS: driving, shopping, community activity, and yard work  PERSONAL FACTORS: Age, Behavior pattern, Fitness, Past/current experiences, Time since onset of injury/illness/exacerbation, and 3+ comorbidities: Vascular Parkinsonism (2024), CAD, anxiety, insomnia, depression, GERD, hypertension, lumbar disc disease with spinal cord stimulator, COPD, asthma, ADHD, chronic pain, chronic  vertigo, MCI   are also affecting patient's functional outcome.   REHAB POTENTIAL: Fair due to cognition and progressive diagnosis   CLINICAL DECISION MAKING: Evolving/moderate complexity  EVALUATION COMPLEXITY: Moderate  PLAN:  PT FREQUENCY: 1-2x/week  PT DURATION: 8 weeks  PLANNED INTERVENTIONS: 97164- PT Re-evaluation, 97110-Therapeutic exercises, 97530- Therapeutic activity, 97112- Neuromuscular re-education, 97535- Self Care, 16109- Manual therapy, [phone]- Gait training, Patient/Family education, Balance training, Stair training, Vestibular training, and DME instructions  PLAN FOR NEXT SESSION: review seated PWR moves   work on balance, give walking program for home, work on sit <> stand transfers, freezing strategies   Final scheduled PT visit - D/C or add more? (PT on last visit mentioned next visit may be last one for PT so was prepared may be the case)   Coreen Devoid, PT, DPT 09/30/2023, 3:48 PM

## 2023-10-03 ENCOUNTER — Ambulatory Visit: Admitting: Occupational Therapy

## 2023-10-03 ENCOUNTER — Ambulatory Visit: Admitting: Speech Pathology

## 2023-10-03 DIAGNOSIS — M6281 Muscle weakness (generalized): Secondary | ICD-10-CM

## 2023-10-03 DIAGNOSIS — R278 Other lack of coordination: Secondary | ICD-10-CM

## 2023-10-03 DIAGNOSIS — R471 Dysarthria and anarthria: Secondary | ICD-10-CM | POA: Diagnosis not present

## 2023-10-03 NOTE — Therapy (Signed)
 OUTPATIENT OCCUPATIONAL THERAPY PARKINSON'S TREATMENT  Patient Name: [name] MRN: [id number] DOB:[birth year], 78 y.o., male Today's Date: 10/03/2023  PCP: Watson Hacking, MD REFERRING PROVIDER: Shirline Dover, DO  END OF SESSION:  OT End of Session - 10/03/23 1020     Visit Number 7    Number of Visits 16    Date for OT Re-Evaluation 11/01/23    Authorization Type [employer] MCR - Auth required    Progress Note Due on Visit 10    OT Start Time 1020    OT Stop Time 1100    OT Time Calculation (min) 40 min    Activity Tolerance Patient tolerated treatment well    Behavior During Therapy [facility] for tasks assessed/performed             Past Medical History:  Diagnosis Date   Acquired hallux rigidus, right 03/25/2023   Acquired trigger finger of left middle finger 04/22/2019   Acquired trigger finger of right middle finger 04/22/2019   ADHD, predominantly hyperactive type    Allergic rhinitis due to pollen 08/08/2010   Allergy  vaccine 1:50,000 03/25/08; 1:10 03/27/09 > 1:2 GH >> DC'd 01/04/2015 for observation        Anemia    takes Fe -    Anginal pain    Ankylosing spondylitis 10/09/2018   Arthritis    Atherosclerosis of aorta 08/30/2020   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 Anastasia Balo   Chronic asthma 05/17/2011   Chronic low back pain with sciatica 11/14/2021   Chronic pain 11/21/2016   COPD (chronic obstructive pulmonary disease)    Dizziness 12/24/2021   Early satiety 09/11/2021   Erectile dysfunction    Esophageal motility disorder 09/11/2021   Generalized anxiety disorder    GERD (gastroesophageal reflux disease)    History of benign positional vertigo    History of colonic polyps 09/11/2021   History of kidney stones    Hoarseness 09/11/2021   Hyperlipidemia    Hypertension    Hypogonadism male    prior use on Testim    Insomnia    Iron deficiency 09/11/2021   Left ventricular dysfunction 10/12/2018   Leg pain, left  11/14/2021   Lumbar disc disease    Major depressive disorder    Mild concentric left ventricular hypertrophy (LVH)    Muscle spasm 11/14/2021   Old inferior myocardial infarction 1995   OSA (obstructive sleep apnea) 11/12/2022   NPSG 10/21/22- AHI 28.7/ hr, desat to 89%/Mean 93%, body weight 165 lbs     Pain in both feet 08/10/2021   Peripheral vascular disease 12/25/2021   Pneumonia    Presbycusis of both ears 04/23/2018   Shortness of breath dyspnea    Slow transit constipation 09/11/2021   Spinal cord stimulator status 11/14/2021   Spondylolisthesis of lumbar region 02/28/2015   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 Anastasia Balo   CHEILECTOMY  10/16/2011   Procedure: CHEILECTOMY;  Surgeon: Janifer Meigs, MD;  Location: [facility];  Service: Orthopedics;  Laterality: Right;   CHOLECYSTECTOMY     COLONOSCOPY     CORONARY ANGIOPLASTY     1995 after MI   CORONARY ANGIOPLASTY WITH  STENT PLACEMENT     1999    ESOPHAGOGASTRODUODENOSCOPY  02/18/2012   Procedure: ESOPHAGOGASTRODUODENOSCOPY (EGD);  Surgeon: Brice Campi, MD;  Location: [facility];  Service: Endoscopy;  Laterality: N/A;   FOOT SURGERY     joint scraped, arthritis right foot   HERNIA REPAIR     LUMBAR DISC SURGERY     x2   right index finger  mass removed- arthritis     Dr. Aloha Arnold   [facility] [place]  Right 05/18/2021   medial toe consistent with callus   SKIN BIOPSY Left 05/17/2021   actinic keratosis   SPINAL CORD STIMULATOR BATTERY EXCHANGE N/A 10/11/2021   Procedure: SPINAL CORD STIMULATOR BATTERY EXCHANGE;  Surgeon: Mort Ards, MD;  Location: [facility] OR;  Service: Orthopedics;  Laterality: N/A;  60 mins   SPINAL CORD STIMULATOR INSERTION N/A  11/21/2016   Procedure: LUMBAR SPINAL CORD STIMULATOR INSERTION;  Surgeon: Mort Ards, MD;  Location: [facility] OR;  Service: Orthopedics;  Laterality: N/A;  Requests 2.5 hrs   TONSILLECTOMY     1953   UPPER GI ENDOSCOPY  [id number]   VASECTOMY     1980   Patient Active Problem List   Diagnosis Date Noted   Attention or concentration deficit [R41.840] 05/30/2023   MCI (mild cognitive impairment) 05/30/2023   Generalized anxiety disorder    Acquired hallux rigidus, right 03/25/2023   OSA (obstructive sleep apnea) 11/12/2022   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   Peripheral vascular disease 12/25/2021   Dizziness 12/24/2021   Leg pain, left 11/14/2021   Chronic low back pain with sciatica 11/14/2021   Spinal cord stimulator status 11/14/2021   Muscle spasm 11/14/2021   Slow transit constipation 09/11/2021   Early satiety 09/11/2021   Hoarseness 09/11/2021   Iron deficiency 09/11/2021   History of colonic polyps 09/11/2021   Weight decreased 09/11/2021   Esophageal motility disorder 09/11/2021   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   Pain in both feet 08/10/2021   Atherosclerosis of aorta 08/30/2020   Mild concentric left ventricular hypertrophy (LVH)    Hypogonadism male    History of kidney stones    Erectile dysfunction    COPD (chronic obstructive pulmonary disease)    Acquired trigger finger of left middle finger 04/22/2019   Acquired trigger finger of right middle finger 04/22/2019   Left ventricular dysfunction 10/12/2018   Ankylosing spondylitis 10/09/2018   Presbycusis of both ears 04/23/2018   Chronic pain 11/21/2016   Arthritis 10/15/2016   Spondylolisthesis of lumbar region 02/28/2015   Hyperlipidemia    Lumbar disc disease    Hypertension    CAD (coronary artery disease) 05/19/2012   GERD (gastroesophageal reflux disease)    Major depressive disorder    Chronic asthma 05/17/2011   Allergic rhinitis due to pollen 08/08/2010   History of benign positional vertigo    ADHD,  predominantly hyperactive type     ONSET DATE: 08/19/2023 (referral date - based off screen recommendations)  REFERRING DIAG: Parkinson's Disease R41.3 (ICD-10-CM) - Memory impairment  THERAPY DIAG:  Muscle weakness (generalized)  Other lack of coordination  Rationale for Evaluation and Treatment: Rehabilitation  SUBJECTIVE:   SUBJECTIVE STATEMENT:  He has been painting the last couple days.   Pt accompanied by: self and wife, [name]  PERTINENT HISTORY: PD (diagnosed Spring 2024), ADHD, CAD, COPD, HTN, HLD, Dizziness, MI  PRECAUTIONS: Fall and Other: no driving, spinal nerve stimulator  WEIGHT BEARING RESTRICTIONS: No  PAIN:  Are you having pain? No  FALLS: Has patient fallen in last 6  months? 50fall  LIVING ENVIRONMENT: Lives with: lives with their spouse Carles Cheadle") and 3 cats Lives in:1 story home, 3-4 steps to enter (ramp on back). 4 steps down to washer/dryer but wife does Has following equipment at home: Otho Blitz - 2 wheeled  PLOF: Independent with basic ADLs, retired  PATIENT GOALS: get better  OBJECTIVE:  Note: Objective measures were completed at Evaluation unless otherwise noted.  HAND DOMINANCE: Right  ADLs: Overall ADLs: slower Transfers/ambulation related to ADLs: no device in house Eating: independent Grooming: independent UB Dressing: slower with jacket, difficulty and occasional assist with buttons LB Dressing: slower, assist for suspenders Toileting: independent Bathing: mod I - pt has grab bars Tub Shower transfers: walk in shower, but currently using tub/shower combo Equipment: Grab bars  IADLs: Shopping: pt accompanies wife occasionally Light housekeeping: pt washes dishes Meal Prep: pt cooks some [employer] mobility: relies on wife for transportation. Has not driven in a year since diagnosis Medication management: pt/wife loads pillbox together, pt sometimes forgets to take meds Financial management: wife does Handwriting: 100%  legible and Moderate micrographia  MOBILITY STATUS:  uses walker outside of home, mostly independent in home  POSTURE COMMENTS:  rounded shoulders and forward head   FUNCTIONAL OUTCOME MEASURES: (from screen on 08/19/23)  Fastening/unfastening 3 buttons: 50 sec Physical performance test: PPT#2 (simulated eating) 14 sec & PPT#4 (donning/doffing jacket): 22 sec  COORDINATION: (from screen on 08/19/23)  9 Hole Peg test: Right: 39 sec; Left: 40 sec Box and Blocks:  Right 37 blocks, Left 45 blocks Tremors: Resting and mild  UE ROM:  WFL   SENSATION: WFL  MUSCLE TONE: not tested  COGNITION: Overall cognitive status: Impaired and decreased memory . Pt also has ADHD (premorbid) and reports he's easily distracted.   OBSERVATIONS: Bradykinesia and Hypokinesia, stooped posture, shuffling feet, arthritis bilateral hands w/ MP ulnar drift                                                                                                                    TREATMENT :   OT educated pt on strategies for participation in painting/artwork secondary to impairments with coordination that prevent him from painting as he did before diagnosis. Therapist recommended use of grips for writing utensils or use of Twist 'N Write or Pen Again given his arthritis. Pt encouraged to use different mediums or focus for artwork, which can be therapeutic but also allow for less stress inducing participation.   OT provided pt with lined tracing paper and had pt practice writing in print with cues to slow pace and make each letter big and deliberate. Improvement noted. Pt also encouraged to use graph paper at home for additional practice.   PATIENT EDUCATION: Education details: see above Person educated: Patient and Spouse Education method: Explanation, Demonstration, Tactile cues, and Verbal cues Education comprehension: verbalized understanding, returned demonstration, verbal cues required, tactile cues required, and  needs further education  HOME EXERCISE PROGRAM: 09/10/23: PWR! Hands, MP finger walking ex, coordination HEP 09/16/23: ADL  strategies, handwriting strategies 09/30/23: bag ex's, memory strategies and ways to keep thinking skills sharp  GOALS: Goals reviewed with patient? Yes  SHORT TERM GOALS: Target date: 10/02/23  Independent with PD specific HEP (including finger walking ex to counteract MP ulnar drift)    Baseline: Goal status: MET  2.  Pt will verbalize understanding of ways to keep thinking skills sharp and ways to compensate for STM changes for medication management Baseline:  Goal status: MET  3.  Pt will write a sentence with no significant decrease in size and maintain 100% legibility  Baseline:  Goal status: IN PROGRESS (using graph paper and writing in print)  4.  Pt will demonstrate increased ease with dressing as evidenced by decreasing PPT#4 (don/ doff jacket) to 18 secs or less  Baseline: 22 sec Goal status: IN PROGRESS  5.  Pt will verbalize understanding of adaptive strategies to increase ease with ADLS/IADLS   Baseline:  Goal status: IN PROGRESS  6.  Pt to improve RUE as evidenced by increasing Box & Blocks by 4 or more Baseline: 37 Goal status: INITIAL  LONG TERM GOALS: Target date: 11/01/23  Pt will verbalize understanding of ways to prevent future PD related complications and appropriate community resources prn  Baseline:  Goal status: INITIAL  2.  Pt will demonstrate improved ease with fastening buttons as evidenced by decreasing 3 button/unbutton time by 8 seconds or more Baseline: 50 sec Goal status: INITIAL  3.  Pt will demonstrate improved fine motor coordination for ADLs as evidenced by decreasing 9 hole peg test score for each hand and by 5 secs  Baseline: Rt = 39 sec, Lt = 40 sec.  Goal status: INITIAL  4.  Pt will write a paragraph with no significant decrease in size and maintain 100% legibility  Baseline:  Goal status: INITIAL  5.  Pt  will consistently don suspenders I'ly Baseline: needs assist Goal status: INITIAL  ASSESSMENT:  CLINICAL IMPRESSION: Patient verbalizes and demonstrates understanding of activity modifications this date to promote more consistent occupational participation. Recommended adaptive pen and pencil in addition to grip given extent of arthritis in hands.   PERFORMANCE DEFICITS: in functional skills including ADLs, IADLs, coordination, dexterity, proprioception, strength, Fine motor control, Gross motor control, mobility, balance, body mechanics, decreased knowledge of precautions, decreased knowledge of use of DME, vision, and UE functional use, cognitive skills including attention, memory, problem solving, and safety awareness, and psychosocial skills including coping strategies.   IMPAIRMENTS: are limiting patient from ADLs, IADLs, leisure, and social participation.   COMORBIDITIES:  may have co-morbidities  that affects occupational performance. Patient will benefit from skilled OT to address above impairments and improve overall function.  REHAB POTENTIAL: Good  PLAN:  OT FREQUENCY: 2x/week  OT DURATION: 8 weeks  PLANNED INTERVENTIONS: 97535 self care/ADL training, 21308 therapeutic exercise, 97530 therapeutic activity, 97112 neuromuscular re-education, 97140 manual therapy, 97018 paraffin, 65784 fluidotherapy, 97010 moist heat, 97034 contrast bath, 97760 Orthotics management and training, 69629 Splinting (initial encounter), [id number] Subsequent splinting/medication, passive range of motion, functional mobility training, visual/perceptual remediation/compensation, energy conservation, coping strategies training, patient/family education, and DME and/or AE instructions  RECOMMENDED OTHER SERVICES: none at this time  CONSULTED AND AGREED WITH PLAN OF CARE: Patient and family member/caregiver  PLAN FOR NEXT SESSION: standing against counter to don/doff jacket   Review [employer]  PRN  Progress towards remaining STGs   [employer], OT 10/03/2023, 2:13 PM

## 2023-10-03 NOTE — Therapy (Signed)
 OUTPATIENT SPEECH LANGUAGE PATHOLOGY [facility]   Patient Name: [name] MRN: [id number] DOB:19-Aug-1945, 78 y.o., male Today's Date: 10/03/2023  PCP: Watson Hacking, MD REFERRING PROVIDER: Shirline Dover, DO  END OF SESSION:  End of Session - 10/03/23 0937     Visit Number 8    Number of Visits 17    Date for SLP Re-Evaluation 10/27/23    Authorization Type [employer] medicare    SLP Start Time 4098    SLP Stop Time  1015    SLP Time Calculation (min) 38 min    Activity Tolerance Patient tolerated treatment well              Past Medical History:  Diagnosis Date   Acquired hallux rigidus, right 03/25/2023   Acquired trigger finger of left middle finger 04/22/2019   Acquired trigger finger of right middle finger 04/22/2019   ADHD, predominantly hyperactive type    Allergic rhinitis due to pollen 08/08/2010   Allergy  vaccine 1:50,000 03/25/08; 1:10 03/27/09 > 1:2 GH >> DC'd 01/04/2015 for observation        Anemia    takes Fe -    Anginal pain    Ankylosing spondylitis 10/09/2018   Arthritis    Atherosclerosis of aorta 08/30/2020   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 Anastasia Balo   Chronic asthma 05/17/2011   Chronic low back pain with sciatica 11/14/2021   Chronic pain 11/21/2016   COPD (chronic obstructive pulmonary disease)    Dizziness 12/24/2021   Early satiety 09/11/2021   Erectile dysfunction    Esophageal motility disorder 09/11/2021   Generalized anxiety disorder    GERD (gastroesophageal reflux disease)    History of benign positional vertigo    History of colonic polyps 09/11/2021   History of kidney stones    Hoarseness 09/11/2021   Hyperlipidemia    Hypertension    Hypogonadism male    prior use on [employer]    Insomnia    Iron deficiency 09/11/2021   Left ventricular dysfunction 10/12/2018   Leg pain, left 11/14/2021   Lumbar disc disease    Major depressive disorder    Mild concentric left  ventricular hypertrophy (LVH)    Muscle spasm 11/14/2021   Old inferior myocardial infarction 1995   OSA (obstructive sleep apnea) 11/12/2022   NPSG 10/21/22- AHI 28.7/ hr, desat to 89%/Mean 93%, body weight 165 lbs     Pain in both feet 08/10/2021   Peripheral vascular disease 12/25/2021   Pneumonia    Presbycusis of both ears 04/23/2018   Shortness of breath dyspnea    Slow transit constipation 09/11/2021   Spinal cord stimulator status 11/14/2021   Spondylolisthesis of lumbar region 02/28/2015   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 Anastasia Balo   CHEILECTOMY  10/16/2011   Procedure: CHEILECTOMY;  Surgeon: Janifer Meigs, MD;  Location: [facility];  Service: Orthopedics;  Laterality: Right;   CHOLECYSTECTOMY     COLONOSCOPY     CORONARY ANGIOPLASTY     1995 after MI   CORONARY ANGIOPLASTY WITH STENT PLACEMENT     1999    ESOPHAGOGASTRODUODENOSCOPY  02/18/2012   Procedure: ESOPHAGOGASTRODUODENOSCOPY (EGD);  Surgeon: Brice Campi, MD;  Location: [facility];  Service: Endoscopy;  Laterality: N/A;   FOOT SURGERY     joint scraped, arthritis right foot   HERNIA REPAIR     LUMBAR DISC SURGERY     x2   right index finger  mass removed- arthritis     Dr. Aloha Arnold   [facility]  Right 05/18/2021   medial toe consistent with callus   SKIN BIOPSY Left 05/17/2021   actinic keratosis   SPINAL CORD STIMULATOR BATTERY EXCHANGE N/A 10/11/2021   Procedure: SPINAL CORD STIMULATOR BATTERY EXCHANGE;  Surgeon: Mort Ards, MD;  Location: [facility];  Service: Orthopedics;  Laterality: N/A;  60 mins   SPINAL CORD STIMULATOR INSERTION N/A 11/21/2016   Procedure: LUMBAR SPINAL CORD STIMULATOR INSERTION;  Surgeon: Mort Ards, MD;   Location: [facility];  Service: Orthopedics;  Laterality: N/A;  Requests 2.5 hrs   TONSILLECTOMY     1953   UPPER GI ENDOSCOPY  [id number]   VASECTOMY     1980   Patient Active Problem List   Diagnosis Date Noted   Attention or concentration deficit [R41.840] 05/30/2023   MCI (mild cognitive impairment) 05/30/2023   Generalized anxiety disorder    Acquired hallux rigidus, right 03/25/2023   OSA (obstructive sleep apnea) 11/12/2022   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   Peripheral vascular disease 12/25/2021   Dizziness 12/24/2021   Leg pain, left 11/14/2021   Chronic low back pain with sciatica 11/14/2021   Spinal cord stimulator status 11/14/2021   Muscle spasm 11/14/2021   Slow transit constipation 09/11/2021   Early satiety 09/11/2021   Hoarseness 09/11/2021   Iron deficiency 09/11/2021   History of colonic polyps 09/11/2021   Weight decreased 09/11/2021   Esophageal motility disorder 09/11/2021   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   Pain in both feet 08/10/2021   Atherosclerosis of aorta 08/30/2020   Mild concentric left ventricular hypertrophy (LVH)    Hypogonadism male    History of kidney stones    Erectile dysfunction    COPD (chronic obstructive pulmonary disease)    Acquired trigger finger of left middle finger 04/22/2019   Acquired trigger finger of right middle finger 04/22/2019   Left ventricular dysfunction 10/12/2018   Ankylosing spondylitis 10/09/2018   Presbycusis of both ears 04/23/2018   Chronic pain 11/21/2016   Arthritis 10/15/2016   Spondylolisthesis of lumbar region 02/28/2015   Hyperlipidemia    Lumbar disc disease    Hypertension    CAD (coronary artery disease) 05/19/2012   GERD (gastroesophageal reflux disease)    Major depressive disorder    Chronic asthma 05/17/2011   Allergic rhinitis due to pollen 08/08/2010   History of benign positional vertigo    ADHD, predominantly hyperactive type     ONSET DATE: 08/19/2023 - referral date  REFERRING DIAG:  R49.0 (ICD-10-CM) - Dysphonia  THERAPY DIAG:  Dysarthria and anarthria  Rationale for Evaluation and Treatment: Rehabilitation  SUBJECTIVE:   SUBJECTIVE STATEMENT: "A little every day" Pt accompanied by: significant other [name]  PERTINENT HISTORY: Dr. Winferd Hatter has previously theorized a vascular parkinsonian presentation being more likely than idiopathic Parkinson's disease. [name] is known to us  from prior course of ST 11/29/22-02/04/23.   Neuropsych eval Nov 2024: Mr. Beske' pattern of performance is suggestive of impairment surrounding executive functioning. Performance variability was further exhibited across processing speed, complex attention, semantic fluency, and both encoding (i.e., learning) and delayed retrieval aspects of memory.         MCI             -  suspect due to meds             -Neurocognitive testing with Dr. Kitty Perkins confirmed MCI in November, 2024.  He mentioned medications as a role in diminishing cognition.  PAIN:  Are you having pain? No  PATIENT REPORTED OUTCOME MEASURES (PROM): - Total 18 The Communicative Participation Item Bank        Does your condition interfere with... Pt Rating   ...talking with people you know 2   ...communicating when you need to say something quickly 3   ...talking with people you do not know 2   ...communicating when you are out in your community 1   ...asking questions in a conversation 2   ....communicating in a small group of people 2   ...having a long conversation 2   ...giving detailed infomrmation 3   ...getting your turn in a fast moving conversation 1   ...trying to persuade a friend or family member to see a different point of view 2  3= Not at all; 2=A little; 1=Quite a bit; 0=Very much                                                                                                                             TREATMENT DATE:  10/03/23: Pt reports "a little" completion of Speak Out! exercises on a daily basis, SLP reminding  pt of value and need for consistent practice. No hoarseness noted this date. SLP initiated Speak Out! Lesson #7 this date with usual model and consistent mod-A to maximize pt success maintaining increased vocal intensity and clear speech throughout each exercise. Pt benefited from mod/max-A and consistent model to to maintain improved vocal quality in conversation.  Averages this date: Sustained ah: 85 dB  Reading: 78 dB  Cognitive Exercise: 75 dB  Side conversation: 69 dB with intermittent cues  Pt demonstrates difficulty recalling names of his cats in conversation and reports ongoing challenges with memory. SLP initiated instruction of memory strategies to aid pt concerns, hand out provided to facilitate carryover. Planning to continue focus on memory strategies and initiate development of plan/schedule next session to support HEP completion.   09/30/23: Enters with moderate hoarseness today. Wife reported persistent hoarseness over weekend despite reported completion of HEP. SLP inquired about seasonal allergies and potential straining during Speak Out! Program. Pt denied both. Instructed Lesson #5 this date. Usual model and consistent mod A provided to utilize intentional volume and projection to reduce hoarseness. In subsequent conversation, frequent mod/max A required to maintain intent and forward projection. Limited awareness of volume decay and hoarseness exhibited. Vocal quality did improve with SLP cues and further instruction of targeted techniques.   09/23/23: Pt shared increased completion of HEP, practicing "more days" as opposed to every other day. SLP reinforced importance of daily practice and lead pt through Speak Out lesson #3 this date. Pt benefiting from occasional min-A and rare model to complete exercises with increased intensity and improved vocal  quality exhibited. Averages this date: Sustained ah: 85 dB  Counting: 83 dB  Reading: 84 dB  Cognitive Exercise: 78 dB  Side  conversation: 71 dB with intermittent cues  Pt expresses increased difficulty "finding my words" this session. SLP initiated pt education of descriptive strategies to aid word retrieval. SLP provided additional education on memory strategy of utilizing visual aids d/t reported concerns with recalling music notes. Pt endorses understanding.  09/19/23: Addressed dysarthria via Speak Out lesson #2. Pt required usual mod to max-A and modeling for accurate completion of therapy exercises. Pt averages the following:   Sustained ah: 84  Counting: 82  Reading: 79  Cognitive Exercise: 75 with usual mod verbal cues and gesture cues Minimal carryover for side comments or conversational speech though pt is able to repeat prior utterances with verbal cues for use of intent. Reviewed cough alternatives, as pt demonstrating coughing this date, endorsing strong cough sensation. Minimal effectiveness in utilizing alternatives this date. Will continue to address.   09/16/23: Exhibited initial throat clear with awareness of need to reduce habit. Pt able to carryover throat clear alternatives with rare min x4 additional opportunities this date. Recently received new Speak Out! Workbook. Has watched some online lessons with inconsistent completion. Re-educated recommendations for daily practice, in which pt verbalized understanding. Indicated feeling self-conscious completing with wife and Librarian, academic. Assisted with re-framing assistance to aid confidence and carryover versus judgement. Re-educated Lesson #1. Usual model and mod A provided to complete targeted tasks and maintain intent to achieve dB levels.  Pt averages the following volume levels:  Sustained AH: 86   Counting:84  Reading: 82  Cognitive Exercise: 79 with usual mod verbal cues and gesture cues Required frequent mod gestures and verbal cues, modeling for carryover of intent /volume when conversation was interjected during and after Speak Out! Lesson  Of  note, pt instructed to bring own water  bottle to next ST session to aid hydration.   09/10/23: Mitch completed online home practice session on [employer] website. He reports his wife signed up for [employer] as well.  Targeted volume and intelligibility using Speak Out! Lesson E [employer]. Pt required usual min verbal cues, modeling, for volume and breath support.  Pt averages the following volume levels:  Sustained AH: 85 - cues required to keep volume and pitch steady and consistent  Counting:85  Reading (phrases): 80  Cognitive Exercise: 76 with usual mod verbal cues and gesture cues Required frequent mod gesture and verbal cues, modeling for carryover of intent /volume when conversation was interjected into Speak Out! Lesson   09/08/23: They have not signed up for e Library on [employer] ([employer]) website - Instructed them to do this - submitted [name]'s email to [employer] for access to [employer]. They both watched the required webinar on [employer] website. Targeted volume and intelligibility using Speak Out! Lesson April Week 1 on [employer] e Libary. Pt required occasional min  verbal cues, modeling, for volume and breath support. Pt averages the following volume levels:  Sustained AH: 85  Counting:80dB with occasional min cues for smooth and connected counting Pitch glides required usual mod modeling, gesture cues to achieve pitch change and prolong glides  Reading (phrases): 75  Cognitive Exercise: 72dB Required usual lmod verbal cues, modeling for carryover of intent /volume answering simple questions following structured practice.  Completed CPIB PROM - See above    09/01/23 (eval day): Eval completed. Initiated training in compensatory strategies for slow processing -  see Patient Instructions. Initiated Speak Out! Lesson one to review and instruct on HEP - With occasional min  A, Mitch achieved the dB targets for warm ups, sustained Ah and counting. He required usual mod A to  maintain volume on glides and conversation exercises. Demonstrated the Parkinson [employer] website and instructed [name] and Carles Cheadle to watch the What is Parkinsons and Living with Parkinsons webinar. Introduced the on line home practice sessions as well. In conversation, [name] responded well to frequent mod verbal cues and visual cues to carryover intentional speech to average 70dB. Provided a log to track completion of HEP   PATIENT EDUCATION: Education details: see above Person educated: Patient and Spouse Education method: Explanation, Demonstration, Verbal cues, and Handouts Education comprehension: verbalized understanding, returned demonstration, verbal cues required, and needs further education  HOME EXERCISE PROGRAM: Speak Out! Therapy Program to be completed twice daily    GOALS: Goals reviewed with patient? Yes  SHORT TERM GOALS: Target date: 09/29/23  Pt will complete HEP for dysarthria with occasional min A Baseline: Goal status: NOT MET  2.  Pt will average 72dB 18/20 sentences Baseline:  Goal status: MET  3.  Pt will complete HEP twice a week 5/7 days outside of ST Baseline:  Goal status: PARTIALLY MET  4.  Pt will carryover 2 compensatory strategies to support processing speed in conversation Baseline:  Goal status: NO MET  5.  Pt will average 72dB over 8 minute conversation with occasional min A Baseline:  Goal status: PARTIALLY MET   LONG TERM GOALS: Target date: 10/27/23  Pt will complete HEP for dysarthria with mod I Baseline:  Goal status:ONGOING  2.  Pt will ID and correct hoarse low volume with supervision cues Baseline:  Goal status: MET  3.  Pt will average 72dB over 15 minute conversation Baseline:  Goal status: ONGOING  4.  Pt will participate in 2 on line home practice sessions on the Parkinson [employer] website Baseline:  Goal status: ONGOING  5.  Pt will verbalize a plan/schedule to continue daily Speak Out! Practice after d/c  from ST Baseline:  Goal status:ONGOING  6.  Pt will improve score on Communication Participation Item Bank Baseline: 18 Goal status: ONGOING  ASSESSMENT:  CLINICAL IMPRESSION: Patient is a 78 y.o. 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 Mitch endorses low volume and hoarse voice. Continued pt education and instruction of Speak Out lessons and principles. [name] was easily stimulable for higher intensity voice which eliminate hoarse quality and improved intelligibility; however, frequent cues required to achieve and maintain targeted intensity and improved quality. I recommend skilled ST to maximize intelligibility for safety, independence and to reduce caregiver burden..   OBJECTIVE IMPAIRMENTS: Objective impairments include attention, memory, executive functioning, expressive language, and dysarthria. These impairments are limiting patient from effectively communicating at home and in community.Factors affecting potential to achieve goals and functional outcome are medical prognosis.. Patient will benefit from skilled SLP services to address above impairments and improve overall function.  REHAB POTENTIAL: Good  PLAN:  SLP FREQUENCY: 2x/week  SLP DURATION: 8 weeks  PLANNED INTERVENTIONS: Aspiration precaution training, Diet toleration management , Language facilitation, Environmental controls, Cueing hierachy, Cognitive reorganization, Internal/external aids, Functional tasks, Multimodal communication approach, SLP instruction and feedback, Compensatory strategies, Patient/family education, and [id number] Treatment of speech (30 or 45 min)     [employer], Student-SLP 10/03/2023, 10:30 AM

## 2023-10-06 NOTE — Therapy (Unsigned)
 OUTPATIENT SPEECH LANGUAGE PATHOLOGY [facility]   Patient Name: [name] MRN: [id number] DOB:May 01, 1946, 78 y.o., male Today's Date: 10/06/2023  PCP: Dwayne Hacking, MD REFERRING PROVIDER: Shirline Dover, DO  END OF SESSION:     Past Medical History:  Diagnosis Date   Acquired hallux rigidus, right 03/25/2023   Acquired trigger finger of left middle finger 04/22/2019   Acquired trigger finger of right middle finger 04/22/2019   ADHD, predominantly hyperactive type    Allergic rhinitis due to pollen 08/08/2010   Allergy  vaccine 1:50,000 03/25/08; 1:10 03/27/09 > 1:2 GH >> DC'd 01/04/2015 for observation        Anemia    takes Fe -    Anginal pain    Ankylosing spondylitis 10/09/2018   Arthritis    Atherosclerosis of aorta 08/30/2020   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 Anastasia Patterson   Chronic asthma 05/17/2011   Chronic low back pain with sciatica 11/14/2021   Chronic pain 11/21/2016   COPD (chronic obstructive pulmonary disease)    Dizziness 12/24/2021   Early satiety 09/11/2021   Erectile dysfunction    Esophageal motility disorder 09/11/2021   Generalized anxiety disorder    GERD (gastroesophageal reflux disease)    History of benign positional vertigo    History of colonic polyps 09/11/2021   History of kidney stones    Hoarseness 09/11/2021   Hyperlipidemia    Hypertension    Hypogonadism male    prior use on Test[employer]    Insomnia    Iron deficiency 09/11/2021   Left ventricular dysfunction 10/12/2018   Leg pain, left 11/14/2021   Lumbar disc disease    Major depressive disorder    Mild concentric left ventricular hypertrophy (LVH)    Muscle spasm 11/14/2021   Old inferior myocardial infarction 1995   OSA (obstructive sleep apnea) 11/12/2022   NPSG 10/21/22- AHI 28.7/ hr, desat to 89%/Mean 93%, body weight 165 lbs     Pain in both feet 08/10/2021   Peripheral vascular disease 12/25/2021   Pneumonia     Presbycusis of both ears 04/23/2018   Shortness of breath dyspnea    Slow transit constipation 09/11/2021   Spinal cord stimulator status 11/14/2021   Spondylolisthesis of lumbar region 02/28/2015   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 Anastasia Patterson   CHEILECTOMY  10/16/2011   Procedure: CHEILECTOMY;  Surgeon: Dwayne Meigs, MD;  Location: [facility];  Service: Orthopedics;  Laterality: Right;   CHOLECYSTECTOMY     COLONOSCOPY     CORONARY ANGIOPLASTY     1995 after MI   CORONARY ANGIOPLASTY WITH STENT PLACEMENT     1999    ESOPHAGOGASTRODUODENOSCOPY  02/18/2012   Procedure: ESOPHAGOGASTRODUODENOSCOPY (EGD);  Surgeon: Dwayne Campi, MD;  Location: [facility];  Service: Endoscopy;  Laterality: N/A;   FOOT SURGERY     joint scraped, arthritis right foot   HERNIA REPAIR     LUMBAR DISC SURGERY     x2   right index finger  mass removed- arthritis     Dr. Aloha Patterson   [facility]  Right 05/18/2021   medial toe consistent with callus   SKIN BIOPSY Left 05/17/2021  actinic keratosis   SPINAL CORD STIMULATOR BATTERY EXCHANGE N/A 10/11/2021   Procedure: SPINAL CORD STIMULATOR BATTERY EXCHANGE;  Surgeon: Dwayne Ards, MD;  Location: [facility];  Service: Orthopedics;  Laterality: N/A;  60 mins   SPINAL CORD STIMULATOR INSERTION N/A 11/21/2016   Procedure: LUMBAR SPINAL CORD STIMULATOR INSERTION;  Surgeon: Dwayne Ards, MD;  Location: [facility];  Service: Orthopedics;  Laterality: N/A;  Requests 2.5 hrs   TONSILLECTOMY     1953   UPPER GI ENDOSCOPY  [id number]   VASECTOMY     1980   Patient Active Problem List   Diagnosis Date Noted   Attention or concentration deficit [R41.840] 05/30/2023   MCI (mild cognitive impairment)  05/30/2023   Generalized anxiety disorder    Acquired hallux rigidus, right 03/25/2023   OSA (obstructive sleep apnea) 11/12/2022   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   Peripheral vascular disease 12/25/2021   Dizziness 12/24/2021   Leg pain, left 11/14/2021   Chronic low back pain with sciatica 11/14/2021   Spinal cord stimulator status 11/14/2021   Muscle spasm 11/14/2021   Slow transit constipation 09/11/2021   Early satiety 09/11/2021   Hoarseness 09/11/2021   Iron deficiency 09/11/2021   History of colonic polyps 09/11/2021   Weight decreased 09/11/2021   Esophageal motility disorder 09/11/2021   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   Pain in both feet 08/10/2021   Atherosclerosis of aorta 08/30/2020   Mild concentric left ventricular hypertrophy (LVH)    Hypogonadism male    History of kidney stones    Erectile dysfunction    COPD (chronic obstructive pulmonary disease)    Acquired trigger finger of left middle finger 04/22/2019   Acquired trigger finger of right middle finger 04/22/2019   Left ventricular dysfunction 10/12/2018   Ankylosing spondylitis 10/09/2018   Presbycusis of both ears 04/23/2018   Chronic pain 11/21/2016   Arthritis 10/15/2016   Spondylolisthesis of lumbar region 02/28/2015   Hyperlipidemia    Lumbar disc disease    Hypertension    CAD (coronary artery disease) 05/19/2012   GERD (gastroesophageal reflux disease)    Major depressive disorder    Chronic asthma 05/17/2011   Allergic rhinitis due to pollen 08/08/2010   History of benign positional vertigo    ADHD, predominantly hyperactive type     ONSET DATE: 08/19/2023 - referral date  REFERRING DIAG: R49.0 (ICD-10-CM) - Dysphonia  THERAPY DIAG:  No diagnosis found.  Rationale for Evaluation and Treatment: Rehabilitation  SUBJECTIVE:   SUBJECTIVE STATEMENT: " Pt accompanied by: significant other [name]  PERTINENT HISTORY: Dr. Winferd Patterson has previously theorized a vascular parkinsonian presentation  being more likely than idiopathic Parkinson's disease. [name] is known to us  from prior course of ST 11/29/22-02/04/23.   Neuropsych eval Nov 2024: [name]' pattern of performance is suggestive of impairment surrounding executive functioning. Performance variability was further exhibited across processing speed, complex attention, semantic fluency, and both encoding (i.e., learning) and delayed retrieval aspects of memory.         MCI             -suspect due to meds             -Neurocognitive testing with Dr. Kitty Perkins confirmed MCI in November, 2024.  He mentioned medications as a role in diminishing cognition.  PAIN:  Are you having pain? No  PATIENT REPORTED OUTCOME MEASURES (PROM): - Total 18 The Communicative Participation Item Bank        Does your condition interfere with... Pt Rating   ...talking  with people you know 2   ...communicating when you need to say something quickly 3   ...talking with people you do not know 2   ...communicating when you are out in your community 1   ...asking questions in a conversation 2   ....communicating in a small group of people 2   ...having a long conversation 2   ...giving detailed infomrmation 3   ...getting your turn in a fast moving conversation 1   ...trying to persuade a friend or family member to see a different point of view 2  3= Not at all; 2=A little; 1=Quite a bit; 0=Very much                                                                                                                             TREATMENT DATE:  10/07/23:  10/03/23: Pt reports "a little" completion of Speak Out! exercises on a daily basis, SLP reminding pt of value and need for consistent practice. No hoarseness noted this date. SLP initiated Speak Out! Lesson #7 this date with usual model and consistent mod-A to maximize pt success maintaining increased vocal intensity and clear speech throughout each exercise. Pt benefited from mod/max-A and consistent model to  to maintain improved vocal quality in conversation.  Averages this date: Sustained ah: 85 dB  Reading: 78 dB  Cognitive Exercise: 75 dB  Side conversation: 69 dB with intermittent cues  Pt demonstrates difficulty recalling names of his cats in conversation and reports ongoing challenges with memory. SLP initiated instruction of memory strategies to aid pt concerns, hand out provided to facilitate carryover. Planning to continue focus on memory strategies and initiate development of plan/schedule next session to support HEP completion.   09/30/23: Enters with moderate hoarseness today. Wife reported persistent hoarseness over weekend despite reported completion of HEP. SLP inquired about seasonal allergies and potential straining during Speak Out! Program. Pt denied both. Instructed Lesson #5 this date. Usual model and consistent mod A provided to utilize intentional volume and projection to reduce hoarseness. In subsequent conversation, frequent mod/max A required to maintain intent and forward projection. Limited awareness of volume decay and hoarseness exhibited. Vocal quality did improve with SLP cues and further instruction of targeted techniques.   09/23/23: Pt shared increased completion of HEP, practicing "more days" as opposed to every other day. SLP reinforced importance of daily practice and lead pt through Speak Out lesson #3 this date. Pt benefiting from occasional min-A and rare model to complete exercises with increased intensity and improved vocal quality exhibited. Averages this date: Sustained ah: 85 dB  Counting: 83 dB  Reading: 84 dB  Cognitive Exercise: 78 dB  Side conversation: 71 dB with intermittent cues  Pt expresses increased difficulty "finding my words" this session. SLP initiated pt education of descriptive strategies to aid word retrieval. SLP provided additional education on memory strategy of utilizing visual aids d/t reported concerns with recalling music notes. Pt  endorses understanding.  09/19/23: Addressed dysarthria via Speak Out lesson #2. Pt required usual mod to max-A and modeling for accurate completion of therapy exercises. Pt averages the following:   Sustained ah: 84  Counting: 82  Reading: 79  Cognitive Exercise: 75 with usual mod verbal cues and gesture cues Minimal carryover for side comments or conversational speech though pt is able to repeat prior utterances with verbal cues for use of intent. Reviewed cough alternatives, as pt demonstrating coughing this date, endorsing strong cough sensation. Minimal effectiveness in utilizing alternatives this date. Will continue to address.   09/16/23: Exhibited initial throat clear with awareness of need to reduce habit. Pt able to carryover throat clear alternatives with rare min x4 additional opportunities this date. Recently received new Speak Out! Workbook. Has watched some online lessons with inconsistent completion. Re-educated recommendations for daily practice, in which pt verbalized understanding. Indicated feeling self-conscious completing with wife and Librarian, academic. Assisted with re-framing assistance to aid confidence and carryover versus judgement. Re-educated Lesson #1. Usual model and mod A provided to complete targeted tasks and maintain intent to achieve dB levels.  Pt averages the following volume levels:  Sustained AH: 86   Counting:84  Reading: 82  Cognitive Exercise: 79 with usual mod verbal cues and gesture cues Required frequent mod gestures and verbal cues, modeling for carryover of intent /volume when conversation was interjected during and after Speak Out! Lesson  Of note, pt instructed to bring own water  bottle to next ST session to aid hydration.   09/10/23: Mitch completed online home practice session on [employer] website. He reports his wife signed up for [employer] as well.  Targeted volume and intelligibility using Speak Out! Lesson E [employer]. Pt  required usual min verbal cues, modeling, for volume and breath support.  Pt averages the following volume levels:  Sustained AH: 85 - cues required to keep volume and pitch steady and consistent  Counting:85  Reading (phrases): 80  Cognitive Exercise: 76 with usual mod verbal cues and gesture cues Required frequent mod gesture and verbal cues, modeling for carryover of intent /volume when conversation was interjected into Speak Out! Lesson   09/08/23: They have not signed up for e Library on [employer] ([employer]) website - Instructed them to do this - submitted [name]'s email to [employer] for access to [employer]. They both watched the required webinar on [employer] website. Targeted volume and intelligibility using Speak Out! Lesson April Week 1 on [employer] e Libary. Pt required occasional min  verbal cues, modeling, for volume and breath support. Pt averages the following volume levels:  Sustained AH: 85  Counting:80dB with occasional min cues for smooth and connected counting Pitch glides required usual mod modeling, gesture cues to achieve pitch change and prolong glides  Reading (phrases): 75  Cognitive Exercise: 72dB Required usual lmod verbal cues, modeling for carryover of intent /volume answering simple questions following structured practice.  Completed CPIB PROM - See above    09/01/23 (eval day): Eval completed. Initiated training in compensatory strategies for slow processing - see Patient Instructions. Initiated Speak Out! Lesson one to review and instruct on HEP - With occasional min  A, Mitch achieved the dB targets for warm ups, sustained Ah and counting. He required usual mod A to maintain volume on glides and conversation exercises. Demonstrated the Parkinson [employer] website and instructed [name] and Carles Cheadle to watch the What is Parkinsons and Living with Parkinsons webinar. Introduced the on Publishing rights manager  sessions as well. In conversation, [name] responded well to frequent mod verbal  cues and visual cues to carryover intentional speech to average 70dB. Provided a log to track completion of HEP   PATIENT EDUCATION: Education details: see above Person educated: Patient and Spouse Education method: Explanation, Demonstration, Verbal cues, and Handouts Education comprehension: verbalized understanding, returned demonstration, verbal cues required, and needs further education  HOME EXERCISE PROGRAM: Speak Out! Therapy Program to be completed twice daily    GOALS: Goals reviewed with patient? Yes  SHORT TERM GOALS: Target date: 09/29/23  Pt will complete HEP for dysarthria with occasional min A Baseline: Goal status: NOT MET  2.  Pt will average 72dB 18/20 sentences Baseline:  Goal status: MET  3.  Pt will complete HEP twice a week 5/7 days outside of ST Baseline:  Goal status: PARTIALLY MET  4.  Pt will carryover 2 compensatory strategies to support processing speed in conversation Baseline:  Goal status: NO MET  5.  Pt will average 72dB over 8 minute conversation with occasional min A Baseline:  Goal status: PARTIALLY MET   LONG TERM GOALS: Target date: 10/27/23  Pt will complete HEP for dysarthria with mod I Baseline:  Goal status:ONGOING  2.  Pt will ID and correct hoarse low volume with supervision cues Baseline:  Goal status: MET  3.  Pt will average 72dB over 15 minute conversation Baseline:  Goal status: ONGOING  4.  Pt will participate in 2 on line home practice sessions on the Parkinson [employer] website Baseline:  Goal status: ONGOING  5.  Pt will verbalize a plan/schedule to continue daily Speak Out! Practice after d/c from ST Baseline:  Goal status:ONGOING  6.  Pt will improve score on Communication Participation Item Bank Baseline: 18 Goal status: ONGOING  ASSESSMENT:  CLINICAL IMPRESSION: Patient is a 78 y.o. 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 Mitch endorses low volume and hoarse voice. Continued pt education and instruction of Speak Out lessons and principles. [name] was easily stimulable for higher intensity voice which eliminate hoarse quality and improved intelligibility; however, frequent cues required to achieve and maintain targeted intensity and improved quality. I recommend skilled ST to maximize intelligibility for safety, independence and to reduce caregiver burden..   OBJECTIVE IMPAIRMENTS: Objective impairments include attention, memory, executive functioning, expressive language, and dysarthria. These impairments are limiting patient from effectively communicating at home and in community.Factors affecting potential to achieve goals and functional outcome are medical prognosis.. Patient will benefit from skilled SLP services to address above impairments and improve overall function.  REHAB POTENTIAL: Good  PLAN:  SLP FREQUENCY: 2x/week  SLP DURATION: 8 weeks  PLANNED INTERVENTIONS: Aspiration precaution training, Diet toleration management , Language facilitation, Environmental controls, Cueing hierachy, Cognitive reorganization, Internal/external aids, Functional tasks, Multimodal communication approach, SLP instruction and feedback, Compensatory strategies, Patient/family education, and [id number] Treatment of speech (30 or 45 min)     Tamar Fairly, CCC-SLP 10/06/2023, 11:42 AM

## 2023-10-07 ENCOUNTER — Ambulatory Visit: Admitting: Occupational Therapy

## 2023-10-07 ENCOUNTER — Ambulatory Visit

## 2023-10-07 ENCOUNTER — Ambulatory Visit: Admitting: Physical Therapy

## 2023-10-07 ENCOUNTER — Encounter: Payer: Self-pay | Admitting: Physical Therapy

## 2023-10-07 DIAGNOSIS — R293 Abnormal posture: Secondary | ICD-10-CM

## 2023-10-07 DIAGNOSIS — R2681 Unsteadiness on feet: Secondary | ICD-10-CM

## 2023-10-07 DIAGNOSIS — R278 Other lack of coordination: Secondary | ICD-10-CM

## 2023-10-07 DIAGNOSIS — R41840 Attention and concentration deficit: Secondary | ICD-10-CM

## 2023-10-07 DIAGNOSIS — M6281 Muscle weakness (generalized): Secondary | ICD-10-CM

## 2023-10-07 DIAGNOSIS — R471 Dysarthria and anarthria: Secondary | ICD-10-CM | POA: Diagnosis not present

## 2023-10-07 DIAGNOSIS — R2689 Other abnormalities of gait and mobility: Secondary | ICD-10-CM

## 2023-10-07 DIAGNOSIS — R41842 Visuospatial deficit: Secondary | ICD-10-CM

## 2023-10-07 NOTE — Therapy (Signed)
 OUTPATIENT OCCUPATIONAL THERAPY PARKINSON'S TREATMENT  Patient Name: [name] MRN: [id number] DOB:August 01, 1945, 78 y.o., male Today's Date: 10/07/2023  PCP: Watson Hacking, MD REFERRING PROVIDER: Shirline Dover, DO  END OF SESSION:  OT End of Session - 10/07/23 1020     Visit Number 8    Number of Visits 16    Date for OT Re-Evaluation 11/01/23    Authorization Type [employer] MCR - Auth required    Progress Note Due on Visit 10    OT Start Time 1017    OT Stop Time 1105    OT Time Calculation (min) 48 min    Activity Tolerance Patient tolerated treatment well    Behavior During Therapy [facility] for tasks assessed/performed             Past Medical History:  Diagnosis Date   Acquired hallux rigidus, right 03/25/2023   Acquired trigger finger of left middle finger 04/22/2019   Acquired trigger finger of right middle finger 04/22/2019   ADHD, predominantly hyperactive type    Allergic rhinitis due to pollen 08/08/2010   Allergy  vaccine 1:50,000 03/25/08; 1:10 03/27/09 > 1:2 GH >> DC'd 01/04/2015 for observation        Anemia    takes Fe -    Anginal pain    Ankylosing spondylitis 10/09/2018   Arthritis    Atherosclerosis of aorta 08/30/2020   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 Anastasia Balo   Chronic asthma 05/17/2011   Chronic low back pain with sciatica 11/14/2021   Chronic pain 11/21/2016   COPD (chronic obstructive pulmonary disease)    Dizziness 12/24/2021   Early satiety 09/11/2021   Erectile dysfunction    Esophageal motility disorder 09/11/2021   Generalized anxiety disorder    GERD (gastroesophageal reflux disease)    History of benign positional vertigo    History of colonic polyps 09/11/2021   History of kidney stones    Hoarseness 09/11/2021   Hyperlipidemia    Hypertension    Hypogonadism male    prior use on Test[employer]    Insomnia    Iron deficiency 09/11/2021   Left ventricular dysfunction 10/12/2018   Leg pain, left  11/14/2021   Lumbar disc disease    Major depressive disorder    Mild concentric left ventricular hypertrophy (LVH)    Muscle spasm 11/14/2021   Old inferior myocardial infarction 1995   OSA (obstructive sleep apnea) 11/12/2022   NPSG 10/21/22- AHI 28.7/ hr, desat to 89%/Mean 93%, body weight 165 lbs     Pain in both feet 08/10/2021   Peripheral vascular disease 12/25/2021   Pneumonia    Presbycusis of both ears 04/23/2018   Shortness of breath dyspnea    Slow transit constipation 09/11/2021   Spinal cord stimulator status 11/14/2021   Spondylolisthesis of lumbar region 02/28/2015   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 Anastasia Balo   CHEILECTOMY  10/16/2011   Procedure: CHEILECTOMY;  Surgeon: Janifer Meigs, MD;  Location: [facility];  Service: Orthopedics;  Laterality: Right;   CHOLECYSTECTOMY     COLONOSCOPY     CORONARY ANGIOPLASTY     1995 after MI   CORONARY ANGIOPLASTY WITH  STENT PLACEMENT     1999    ESOPHAGOGASTRODUODENOSCOPY  02/18/2012   Procedure: ESOPHAGOGASTRODUODENOSCOPY (EGD);  Surgeon: Brice Campi, MD;  Location: [facility];  Service: Endoscopy;  Laterality: N/A;   FOOT SURGERY     joint scraped, arthritis right foot   HERNIA REPAIR     LUMBAR DISC SURGERY     x2   right index finger  mass removed- arthritis     Dr. Aloha Arnold   [facility]  Right 05/18/2021   medial toe consistent with callus   SKIN BIOPSY Left 05/17/2021   actinic keratosis   SPINAL CORD STIMULATOR BATTERY EXCHANGE N/A 10/11/2021   Procedure: SPINAL CORD STIMULATOR BATTERY EXCHANGE;  Surgeon: Mort Ards, MD;  Location: [facility] OR;  Service: Orthopedics;  Laterality: N/A;  60 mins   SPINAL CORD STIMULATOR INSERTION N/A  11/21/2016   Procedure: LUMBAR SPINAL CORD STIMULATOR INSERTION;  Surgeon: Mort Ards, MD;  Location: [facility] OR;  Service: Orthopedics;  Laterality: N/A;  Requests 2.5 hrs   TONSILLECTOMY     1953   UPPER GI ENDOSCOPY  [id number]   VASECTOMY     1980   Patient Active Problem List   Diagnosis Date Noted   Attention or concentration deficit [R41.840] 05/30/2023   MCI (mild cognitive impairment) 05/30/2023   Generalized anxiety disorder    Acquired hallux rigidus, right 03/25/2023   OSA (obstructive sleep apnea) 11/12/2022   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   Peripheral vascular disease 12/25/2021   Dizziness 12/24/2021   Leg pain, left 11/14/2021   Chronic low back pain with sciatica 11/14/2021   Spinal cord stimulator status 11/14/2021   Muscle spasm 11/14/2021   Slow transit constipation 09/11/2021   Early satiety 09/11/2021   Hoarseness 09/11/2021   Iron deficiency 09/11/2021   History of colonic polyps 09/11/2021   Weight decreased 09/11/2021   Esophageal motility disorder 09/11/2021   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   Pain in both feet 08/10/2021   Atherosclerosis of aorta 08/30/2020   Mild concentric left ventricular hypertrophy (LVH)    Hypogonadism male    History of kidney stones    Erectile dysfunction    COPD (chronic obstructive pulmonary disease)    Acquired trigger finger of left middle finger 04/22/2019   Acquired trigger finger of right middle finger 04/22/2019   Left ventricular dysfunction 10/12/2018   Ankylosing spondylitis 10/09/2018   Presbycusis of both ears 04/23/2018   Chronic pain 11/21/2016   Arthritis 10/15/2016   Spondylolisthesis of lumbar region 02/28/2015   Hyperlipidemia    Lumbar disc disease    Hypertension    CAD (coronary artery disease) 05/19/2012   GERD (gastroesophageal reflux disease)    Major depressive disorder    Chronic asthma 05/17/2011   Allergic rhinitis due to pollen 08/08/2010   History of benign positional vertigo    ADHD,  predominantly hyperactive type     ONSET DATE: 08/19/2023 (referral date - based off screen recommendations)  REFERRING DIAG: Parkinson's Disease R41.3 (ICD-10-CM) - Memory impairment  THERAPY DIAG:  Muscle weakness (generalized)  Other lack of coordination  Unsteadiness on feet  Attention and concentration deficit  Visuospatial deficit  Abnormal posture  Rationale for Evaluation and Treatment: Rehabilitation  SUBJECTIVE:   SUBJECTIVE STATEMENT:  He has been painting the last couple days.   Pt accompanied by: self and wife, [name]  PERTINENT HISTORY: PD (diagnosed Spring 2024), ADHD, CAD, COPD, HTN, HLD, Dizziness, MI  PRECAUTIONS: Fall and Other: no driving, spinal nerve stimulator  WEIGHT BEARING RESTRICTIONS: No  PAIN:  Are you having pain? No  FALLS: Has patient fallen in last 6 months? 71fall  LIVING ENVIRONMENT: Lives with: lives with their spouse Carles Cheadle") and 3 cats Lives in:1 story home, 3-4 steps to enter (ramp on back). 4 steps down to washer/dryer but wife does Has following equipment at home: Otho Blitz - 2 wheeled  PLOF: Independent with basic ADLs, retired  PATIENT GOALS: get better  OBJECTIVE:  Note: Objective measures were completed at Evaluation unless otherwise noted.  HAND DOMINANCE: Right  ADLs: Overall ADLs: slower Transfers/ambulation related to ADLs: no device in house Eating: independent Grooming: independent UB Dressing: slower with jacket, difficulty and occasional assist with buttons LB Dressing: slower, assist for suspenders Toileting: independent Bathing: mod I - pt has grab bars Tub Shower transfers: walk in shower, but currently using tub/shower combo Equipment: Grab bars  IADLs: Shopping: pt accompanies wife occasionally Light housekeeping: pt washes dishes Meal Prep: pt cooks some [employer] mobility: relies on wife for transportation. Has not driven in a year since diagnosis Medication management: pt/wife loads  pillbox together, pt sometimes forgets to take meds Financial management: wife does Handwriting: 100% legible and Moderate micrographia  MOBILITY STATUS:  uses walker outside of home, mostly independent in home  POSTURE COMMENTS:  rounded shoulders and forward head   FUNCTIONAL OUTCOME MEASURES: (from screen on 08/19/23)  Fastening/unfastening 3 buttons: 50 sec Physical performance test: PPT#2 (simulated eating) 14 sec & PPT#4 (donning/doffing jacket): 22 sec  COORDINATION: (from screen on 08/19/23)  9 Hole Peg test: Right: 39 sec; Left: 40 sec Box and Blocks:  Right 37 blocks, Left 45 blocks Tremors: Resting and mild  UE ROM:  WFL   SENSATION: WFL  MUSCLE TONE: not tested  COGNITION: Overall cognitive status: Impaired and decreased memory . Pt also has ADHD (premorbid) and reports he's easily distracted.   OBSERVATIONS: Bradykinesia and Hypokinesia, stooped posture, shuffling feet, arthritis bilateral hands w/ MP ulnar drift                                                                                                                    TREATMENT :   Reviewed strategies for painting and supporting upper arm prn for painting on easel (inclined surface)  Reviewed writing and issued more graph paper from [employer]. Practiced writing on graph paper in print short sentence at 100% legibility maintaining size  Pt reports dropping things or not completely setting items on table (get item half on table and half off) - pt instructed to focus and look at what he's doing to get item fully on table, then release fully before moving hand away  Reviewed golf solitaire with patient however think this is too complex for him d/t cognitive deficits (decreased attention, decreased mental flexibility, decreased memory, and decreased ability to think ahead and strategize a few steps in advance)  Assessed remaining STG's (except PPT #4 because pt did not bring jacket today). Pt  instructed to bring jacket for next session  PATIENT  EDUCATION: Education details: see above Person educated: Patient and Spouse Education method: Explanation, Demonstration, Tactile cues, and Verbal cues Education comprehension: verbalized understanding, returned demonstration, verbal cues required, tactile cues required, and needs further education  HOME EXERCISE PROGRAM: 09/10/23: PWR! Hands, MP finger walking ex, coordination HEP 09/16/23: ADL strategies, handwriting strategies 09/30/23: bag ex's, memory strategies and ways to keep thinking skills sharp  GOALS: Goals reviewed with patient? Yes  SHORT TERM GOALS: Target date: 10/02/23  Independent with PD specific HEP (including finger walking ex to counteract MP ulnar drift)    Baseline: Goal status: MET  2.  Pt will verbalize understanding of ways to keep thinking skills sharp and ways to compensate for STM changes for medication management Baseline:  Goal status: MET  3.  Pt will write a sentence with no significant decrease in size and maintain 100% legibility  Baseline:  Goal status: MET -  using graph paper and writing in print for short sentences   4.  Pt will demonstrate increased ease with dressing as evidenced by decreasing PPT#4 (don/ doff jacket) to 18 secs or less  Baseline: 22 sec Goal status: IN PROGRESS  5.  Pt will verbalize understanding of adaptive strategies to increase ease with ADLS/IADLS   Baseline:  Goal status: MET   6.  Pt to improve RUE as evidenced by increasing Box & Blocks by 4 or more Baseline: 37 Goal status: NOT MET (10/07/23: 39 Blocks)  LONG TERM GOALS: Target date: 11/01/23  Pt will verbalize understanding of ways to prevent future PD related complications and appropriate community resources prn  Baseline:  Goal status: INITIAL  2.  Pt will demonstrate improved ease with fastening buttons as evidenced by decreasing 3 button/unbutton time by 8 seconds or more Baseline: 50 sec Goal  status: INITIAL  3.  Pt will demonstrate improved fine motor coordination for ADLs as evidenced by decreasing 9 hole peg test score for each hand and by 5 secs  Baseline: Rt = 39 sec, Lt = 40 sec.  Goal status: INITIAL  4.  Pt will write a paragraph with no significant decrease in size and maintain 100% legibility  Baseline:  Goal status: INITIAL  5.  Pt will consistently don suspenders I'ly Baseline: needs assist Goal status: INITIAL  ASSESSMENT:  CLINICAL IMPRESSION: Patient easily distracted during activities and during conversations and needs redirection and cues to focus on task at hand. Pt has made improvements in functional tasks including writing w/ graph paper and has met most STG's at this time.   PERFORMANCE DEFICITS: in functional skills including ADLs, IADLs, coordination, dexterity, proprioception, strength, Fine motor control, Gross motor control, mobility, balance, body mechanics, decreased knowledge of precautions, decreased knowledge of use of DME, vision, and UE functional use, cognitive skills including attention, memory, problem solving, and safety awareness, and psychosocial skills including coping strategies.   IMPAIRMENTS: are limiting patient from ADLs, IADLs, leisure, and social participation.   COMORBIDITIES:  may have co-morbidities  that affects occupational performance. Patient will benefit from skilled OT to address above impairments and improve overall function.  REHAB POTENTIAL: Good  PLAN:  OT FREQUENCY: 2x/week  OT DURATION: 8 weeks  PLANNED INTERVENTIONS: 97535 self care/ADL training, 91478 therapeutic exercise, 97530 therapeutic activity, 97112 neuromuscular re-education, 97140 manual therapy, 97018 paraffin, 29562 fluidotherapy, 97010 moist heat, 97034 contrast bath, 97760 Orthotics management and training, 13086 Splinting (initial encounter), [id number] Subsequent splinting/medication, passive range of motion, functional mobility training,  visual/perceptual remediation/compensation, energy conservation, coping strategies training, patient/family  education, and DME and/or AE instructions  RECOMMENDED OTHER SERVICES: none at this time  CONSULTED AND AGREED WITH PLAN OF CARE: Patient and family member/caregiver  PLAN FOR NEXT SESSION: standing against counter to don/doff jacket and check remaining STG (PPT #4) and progress towards LTG's    Velinda Getting, OT 10/07/2023, 11:17 AM

## 2023-10-07 NOTE — Therapy (Signed)
 OUTPATIENT PHYSICAL THERAPY NEURO TREATMENT/DISCHARGE SUMMARY   Patient Name: [name] MRN: [id number] DOB:1946/03/17, 78 y.o., male Today's Date: 10/07/2023   PCP: Watson Hacking, MD   REFERRING PROVIDER: Shirline Dover, DO   END OF SESSION:  PT End of Session - 10/07/23 1106     Visit Number 7    Number of Visits 7    Date for PT Re-Evaluation 10/31/23    Authorization Type [employer]    PT Start Time 1105   handoff from OT   PT Stop Time 1145   4 minutes non-billable due to pt in restroom   PT Time Calculation (min) 40 min    Equipment Utilized During Treatment Gait belt    Activity Tolerance Patient tolerated treatment well    Behavior During Therapy [facility] for tasks assessed/performed              Past Medical History:  Diagnosis Date   Acquired hallux rigidus, right 03/25/2023   Acquired trigger finger of left middle finger 04/22/2019   Acquired trigger finger of right middle finger 04/22/2019   ADHD, predominantly hyperactive type    Allergic rhinitis due to pollen 08/08/2010   Allergy  vaccine 1:50,000 03/25/08; 1:10 03/27/09 > 1:2 GH >> DC'd 01/04/2015 for observation        Anemia    takes Fe -    Anginal pain    Ankylosing spondylitis 10/09/2018   Arthritis    Atherosclerosis of aorta 08/30/2020   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 Anastasia Balo   Chronic asthma 05/17/2011   Chronic low back pain with sciatica 11/14/2021   Chronic pain 11/21/2016   COPD (chronic obstructive pulmonary disease)    Dizziness 12/24/2021   Early satiety 09/11/2021   Erectile dysfunction    Esophageal motility disorder 09/11/2021   Generalized anxiety disorder    GERD (gastroesophageal reflux disease)    History of benign positional vertigo    History of colonic polyps 09/11/2021   History of kidney stones    Hoarseness 09/11/2021   Hyperlipidemia    Hypertension    Hypogonadism male    prior use on Testim    Insomnia     Iron deficiency 09/11/2021   Left ventricular dysfunction 10/12/2018   Leg pain, left 11/14/2021   Lumbar disc disease    Major depressive disorder    Mild concentric left ventricular hypertrophy (LVH)    Muscle spasm 11/14/2021   Old inferior myocardial infarction 1995   OSA (obstructive sleep apnea) 11/12/2022   NPSG 10/21/22- AHI 28.7/ hr, desat to 89%/Mean 93%, body weight 165 lbs     Pain in both feet 08/10/2021   Peripheral vascular disease 12/25/2021   Pneumonia    Presbycusis of both ears 04/23/2018   Shortness of breath dyspnea    Slow transit constipation 09/11/2021   Spinal cord stimulator status 11/14/2021   Spondylolisthesis of lumbar region 02/28/2015   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 Anastasia Balo   CHEILECTOMY  10/16/2011   Procedure: CHEILECTOMY;  Surgeon: Janifer Meigs, MD;  Location: [facility];  Service: Orthopedics;  Laterality: Right;   CHOLECYSTECTOMY     COLONOSCOPY  CORONARY ANGIOPLASTY     1995 after MI   CORONARY ANGIOPLASTY WITH STENT PLACEMENT     1999    ESOPHAGOGASTRODUODENOSCOPY  02/18/2012   Procedure: ESOPHAGOGASTRODUODENOSCOPY (EGD);  Surgeon: Brice Campi, MD;  Location: [facility];  Service: Endoscopy;  Laterality: N/A;   FOOT SURGERY     joint scraped, arthritis right foot   HERNIA REPAIR     LUMBAR DISC SURGERY     x2   right index finger  mass removed- arthritis     Dr. Aloha Arnold   [facility]  Right 05/18/2021   medial toe consistent with callus   SKIN BIOPSY Left 05/17/2021   actinic keratosis   SPINAL CORD STIMULATOR BATTERY EXCHANGE N/A 10/11/2021   Procedure: SPINAL CORD STIMULATOR BATTERY EXCHANGE;  Surgeon: Mort Ards, MD;  Location: [facility];  Service:  Orthopedics;  Laterality: N/A;  60 mins   SPINAL CORD STIMULATOR INSERTION N/A 11/21/2016   Procedure: LUMBAR SPINAL CORD STIMULATOR INSERTION;  Surgeon: Mort Ards, MD;  Location: [facility];  Service: Orthopedics;  Laterality: N/A;  Requests 2.5 hrs   TONSILLECTOMY     1953   UPPER GI ENDOSCOPY  [id number]   VASECTOMY     1980   Patient Active Problem List   Diagnosis Date Noted   Attention or concentration deficit [R41.840] 05/30/2023   MCI (mild cognitive impairment) 05/30/2023   Generalized anxiety disorder    Acquired hallux rigidus, right 03/25/2023   OSA (obstructive sleep apnea) 11/12/2022   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   Peripheral vascular disease 12/25/2021   Dizziness 12/24/2021   Leg pain, left 11/14/2021   Chronic low back pain with sciatica 11/14/2021   Spinal cord stimulator status 11/14/2021   Muscle spasm 11/14/2021   Slow transit constipation 09/11/2021   Early satiety 09/11/2021   Hoarseness 09/11/2021   Iron deficiency 09/11/2021   History of colonic polyps 09/11/2021   Weight decreased 09/11/2021   Esophageal motility disorder 09/11/2021   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   Pain in both feet 08/10/2021   Atherosclerosis of aorta 08/30/2020   Mild concentric left ventricular hypertrophy (LVH)    Hypogonadism male    History of kidney stones    Erectile dysfunction    COPD (chronic obstructive pulmonary disease)    Acquired trigger finger of left middle finger 04/22/2019   Acquired trigger finger of right middle finger 04/22/2019   Left ventricular dysfunction 10/12/2018   Ankylosing spondylitis 10/09/2018   Presbycusis of both ears 04/23/2018   Chronic pain 11/21/2016   Arthritis 10/15/2016   Spondylolisthesis of lumbar region 02/28/2015   Hyperlipidemia    Lumbar disc disease    Hypertension    CAD (coronary artery disease) 05/19/2012   GERD (gastroesophageal reflux disease)    Major depressive disorder    Chronic asthma 05/17/2011   Allergic  rhinitis due to pollen 08/08/2010   History of benign positional vertigo    ADHD, predominantly hyperactive type     ONSET DATE: 08/19/2023  REFERRING DIAG: G21.4 (ICD-10-CM) - Vascular parkinsonism (HCC)   THERAPY DIAG:  Muscle weakness (generalized)  Unsteadiness on feet  Other abnormalities of gait and mobility  Rationale for Evaluation and Treatment: Rehabilitation  SUBJECTIVE:  SUBJECTIVE STATEMENT: No changes since he was last here. No falls. Reports he was sitting in a chair and bent over picking up pine cones.   Pt accompanied by:  Self, spouse [name]    PERTINENT HISTORY: PMH: Vascular Parkinsonism (2024), CAD, anxiety, insomnia, depression, GERD, hypertension, lumbar disc disease with spinal cord stimulator, COPD, asthma, ADHD, chronic pain, chronic vertigo, MCI   PAIN:  Are you having pain? No  PRECAUTIONS: Fall Spinal cord stimulator   FALLS: Has patient fallen in last 6 months? No  LIVING ENVIRONMENT: Lives with: lives with their spouse Lives in: House/apartment Stairs: Yes: External: 3 in front and 5 in back steps; on right going up, on left going up, can reach both, and always uses back steps (where he has the most falls)  Also has 5 steps to get down to utility room with no handrails  Has following equipment at home: Walker - 2 wheeled  PLOF: Independent with community mobility with device Notices more difficulty with buttons   PATIENT GOALS: "I want to be 78 years old"  Wants to get up out of a chair without feeling unsteady   OBJECTIVE:  Note: Objective measures were completed at Evaluation unless otherwise noted.   COGNITION: Overall cognitive status: Impaired, pt with MCI    SENSATION: Pt reports occasional tingling in R foot, notes L foot will just hurt    COORDINATION: Heel to shin: slightly slower with RLE    POSTURE: rounded shoulders, forward head, and posterior pelvic tilt  LOWER EXTREMITY ROM:    Decr knee extension AROM RLE > LLE   LOWER EXTREMITY MMT:    MMT Right Eval Left Eval  Hip flexion 5 5  Hip extension    Hip abduction 4 4  Hip adduction 5 5  Hip internal rotation    Hip external rotation    Knee flexion 5 5  Knee extension 4+ 4+  Ankle dorsiflexion 3- 4  Ankle plantarflexion    Ankle inversion    Ankle eversion    (Blank rows = not tested)  All tested In sitting   BED MOBILITY:  Pt reports more difficulty getting out of bed at night, pt does not give a specific answer of what is challenging about this   TRANSFERS: Assistive device utilized: None  Sit to stand: CGA Stand to sit: SBA and CGA  Pt needing to use BUE support to come to stand, decr anterior weight shift. In standing, pt with incr knee flexion. Pt with decr eccentric control when lowering to chair. Pt needs cues to fully back up to chair before sitting down.   Pt attempting to stand without UE support, pt does not scoot out far enough to edge of seated or have enough anterior weight shift, pt tries to rock and stand multiple times, but unable to come to stand   GAIT: Gait pattern: step through pattern, decreased step length- Right, decreased step length- Left, decreased stride length, decreased hip/knee flexion- Right, decreased hip/knee flexion- Left, decreased ankle dorsiflexion- Right, decreased ankle dorsiflexion- Left, shuffling, trunk flexed, poor foot clearance- Right, and poor foot clearance- Left Distance walked: Clinic distances Assistive device utilized: Walker - 2 wheeled Level of assistance: SBA and CGA Comments: With use of RW, pt with decr foot clearance with RLE and incr forward flexed posture while pushing RW anteriorly. Also when turning, needs cues to keep RW on the ground instead of picking it up when turning. Pt  intermittently catching R>L foot on the floor  FUNCTIONAL TESTS:  5 times sit to stand: 16.5 seconds with BUE support from chair  Timed up and go (TUG): 21.1 seconds with RW  10 meter walk test: 16.3 seconds = 2.01 ft/sec                                                                                                                                  TREATMENT DATE:   Therapeutic Activity:  Goal Assessment: 5x sit <> stand: 16.1 seconds with no UE support from mat table, no episodes of retropulsion TUG: 19.2 seconds with RW Gait speed: 20.1 seconds with RW = 1.63 ft/sec   GAIT: Gait pattern: step through pattern, decreased step length- Right, decreased step length- Left, decreased stride length, decreased hip/knee flexion- Right, decreased hip/knee flexion- Left, decreased ankle dorsiflexion- Right, decreased ankle dorsiflexion- Left, shuffling, trunk flexed, poor foot clearance- Right, and poor foot clearance- Left Distance walked: Clinic distances  Assistive device utilized: Walker - 2 wheeled Level of assistance: SBA and CGA  Comments: Continued cues for staying closer to RW, picking up feet, and incr stride length. Pt still not responsive to cues despite frequent education at each session. Pt still pushing RW too far anteriorly and having festination/incr shuffled steps with incr forward flexed posture. Educated to make sure pt turns all the way with RW before sitting down to chair and to make sure pt uses BUE support from chair to stand instead of holding RW when coming to stand up.    Pt performs PWR! Moves in seated position x 10 reps - reviewed from HEP (new handout given at previous session) PWR! Up for improved posture   PWR! Rock for improved weight shifting, performed with reaching across body, cued to look at hands    PWR! Twist for improved trunk rotation, cues to reset in the middle with tall posture    PWR! Step for improved step initiation, stepping with both feet  big in and out    Cues provided for technique and how each relates to function and working on larger amplitude movements. Demo cues needed    Provided new printout of sit <> stands to work on at home for [employer], and educated on importance of performing sit <> stands and seated PWR moves for HEP  Reviewed freezing strategies to reduce falls risk with emphasis on STOP and then weight shifting before taking a big step, reviewed with pt and pt's spouse and went over with caregiver/spouse proper cues to give pt  Went over scheduling for PD screens in 6 months    PATIENT EDUCATION: Education details: See above, will schedule PD screens in 6 months, provided new handout for sit <> stands, reviewed HEP, results of goals  Person educated: Patient and Spouse Education method: Explanation, Demonstration, and Handouts Education comprehension: verbalized understanding, returned demonstration, and needs further education  HOME EXERCISE PROGRAM: Access Code: [facility] URL: [email or web address] Date: 09/08/2023 Prepared by: Korry Dalgleish  Exercises - Sit to Stand  - 1-2 x daily - 5 x weekly - 2 sets - 10 reps  Seated PWR Moves  PHYSICAL THERAPY DISCHARGE SUMMARY  Visits from Start of Care: 7  Current functional level related to goals / functional outcomes: See LTGs/Clinical Assessment Statement   Remaining deficits: Cognitive impairments, decr safety awareness/impulsivity, impaired postural control, impaired timing/coordination of gait    Education / Equipment: HEP, fall prevention, freezing/festination education, community resources for PD, use of RW at all times     Patient agrees to discharge. Patient goals were partially met/not met. Patient is being discharged due to maximized rehab potential.  Plan to schedule PD screens in 6 months    GOALS: Goals reviewed with patient? Yes  SHORT TERM GOALS: Target date: 09/22/2023  Pt will be independent with initial HEP for  strengh, balance, walking in order to build upon functional gains made in therapy. Baseline: Goal status: PARTIALLY MET  2.  Pt and pt's spouse will verbalize freezing strategies in order to decr risk of falls.  Baseline:  Goal status: MET   LONG TERM GOALS: Target date: 10/13/2023  Pt will be independent with final HEP for strengh, balance, walking in order to build upon functional gains made in therapy. Baseline: pt has not been performing Goal status: NOT MET  2.  Pt will improve 5x sit<>stand to less than or equal to 17 sec without UE support to demonstrate improved functional strength and transfer efficiency.   Baseline: 16.5 seconds with BUE support from chair   16.1 seconds with no UE support from mat table, no episodes of retropulsion (4/29) Goal status: MET  3.  Pt will improve TUG time to 18 seconds  or less with RW in order to demo decrease fall risk.  Baseline: 21.1 seconds with RW  19.2 seconds with RW (4/29)  Goal status: NOT MET  4.  Pt will improve gait speed with RW to at least 2.3 ft/sec for improved gait efficiency and step length in order to demo improved community mobility.   Baseline: 16.3 seconds = 2.01 ft/sec  20.1 seconds with RW = 1.63 ft/sec (4/29) Goal status: NOT MET  ASSESSMENT:  CLINICAL IMPRESSION: Today's skilled session focused on assessing pt's LTGs for anticipated D/C. Pt continues with poor carryover from each session despite education, esp with gait mechanics and freezing episodes. Pt has not been performing his HEP. Reviewed seated PWR moves and sit <> stands and provided new handouts. Went over freezing strategies and sit <> stand techniques. Continued education on use of RW at all times to help decr fall risk. Pt did not meet 3 out of 4 LTGs in regards to TUG, gait speed, and HEP. Pt had improved his TUG, but not quite to goal level. Pt did meet goal in regards to 5x sit <> stand. With initial cues for proper set-up, pt able to perform 5  in a row without UE support with proper technique. With gait, pt still demonstrates significant forward flexed posture, festination/shuffling, and pushing RW too far anteriorly despite education at each session. Pt will be discharged from PT at this time due to reaching max potential for PT and pt having limited carry over at each session. Pt and pt's spouse in agreement with plan. Plan to schedule PD screens in 6 months.      OBJECTIVE IMPAIRMENTS: Abnormal gait, decreased activity tolerance, decreased balance, decreased cognition, decreased coordination, decreased endurance, decreased knowledge of use of DME, decreased mobility, difficulty walking,  decreased ROM, decreased strength, decreased safety awareness, impaired flexibility, impaired sensation, and postural dysfunction.   ACTIVITY LIMITATIONS: carrying, lifting, bending, standing, stairs, transfers, bed mobility, reach over head, and locomotion level  PARTICIPATION LIMITATIONS: driving, shopping, community activity, and yard work  PERSONAL FACTORS: Age, Behavior pattern, Fitness, Past/current experiences, Time since onset of injury/illness/exacerbation, and 3+ comorbidities: Vascular Parkinsonism (2024), CAD, anxiety, insomnia, depression, GERD, hypertension, lumbar disc disease with spinal cord stimulator, COPD, asthma, ADHD, chronic pain, chronic vertigo, MCI   are also affecting patient's functional outcome.   REHAB POTENTIAL: Fair due to cognition and progressive diagnosis   CLINICAL DECISION MAKING: Evolving/moderate complexity  EVALUATION COMPLEXITY: Moderate  PLAN:  PT FREQUENCY: 1-2x/week  PT DURATION: 8 weeks  PLANNED INTERVENTIONS: 97164- PT Re-evaluation, 97110-Therapeutic exercises, 97530- Therapeutic activity, 97112- Neuromuscular re-education, 97535- Self Care, 16109- Manual therapy, [phone]- Gait training, Patient/Family education, Balance training, Stair training, Vestibular training, and DME instructions  PLAN FOR  NEXT SESSION: D/C from PT, scheduled for screens in 6 months    Se[place]ron [place]ress, PT, DPT 10/07/2023, 11:48 AM

## 2023-10-09 ENCOUNTER — Encounter: Payer: Self-pay | Admitting: Occupational Therapy

## 2023-10-09 ENCOUNTER — Ambulatory Visit: Attending: Neurology | Admitting: Occupational Therapy

## 2023-10-09 DIAGNOSIS — M6281 Muscle weakness (generalized): Secondary | ICD-10-CM | POA: Diagnosis present

## 2023-10-09 DIAGNOSIS — R41840 Attention and concentration deficit: Secondary | ICD-10-CM | POA: Diagnosis present

## 2023-10-09 DIAGNOSIS — R471 Dysarthria and anarthria: Secondary | ICD-10-CM | POA: Diagnosis present

## 2023-10-09 DIAGNOSIS — R278 Other lack of coordination: Secondary | ICD-10-CM | POA: Diagnosis present

## 2023-10-09 DIAGNOSIS — R41841 Cognitive communication deficit: Secondary | ICD-10-CM | POA: Diagnosis present

## 2023-10-09 DIAGNOSIS — R293 Abnormal posture: Secondary | ICD-10-CM | POA: Diagnosis present

## 2023-10-09 DIAGNOSIS — R2681 Unsteadiness on feet: Secondary | ICD-10-CM

## 2023-10-09 DIAGNOSIS — R41842 Visuospatial deficit: Secondary | ICD-10-CM

## 2023-10-09 NOTE — Therapy (Signed)
 OUTPATIENT OCCUPATIONAL THERAPY PARKINSON'S TREATMENT  Patient Name: [name] MRN: [id number] DOB:09/30/1945, 78 y.o., male Today's Date: 10/09/2023  PCP: Watson Hacking, MD REFERRING PROVIDER: Shirline Dover, DO  END OF SESSION:  OT End of Session - 10/09/23 1025     Visit Number 9    Number of Visits 16    Date for OT Re-Evaluation 11/01/23    Authorization Type [employer] MCR - Auth required    Progress Note Due on Visit 10    OT Start Time 1020    OT Stop Time 1100    OT Time Calculation (min) 40 min    Activity Tolerance Patient tolerated treatment well    Behavior During Therapy [facility] for tasks assessed/performed             Past Medical History:  Diagnosis Date   Acquired hallux rigidus, right 03/25/2023   Acquired trigger finger of left middle finger 04/22/2019   Acquired trigger finger of right middle finger 04/22/2019   ADHD, predominantly hyperactive type    Allergic rhinitis due to pollen 08/08/2010   Allergy  vaccine 1:50,000 03/25/08; 1:10 03/27/09 > 1:2 GH >> DC'd 01/04/2015 for observation        Anemia    takes Fe -    Anginal pain    Ankylosing spondylitis 10/09/2018   Arthritis    Atherosclerosis of aorta 08/30/2020   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 Anastasia Balo   Chronic asthma 05/17/2011   Chronic low back pain with sciatica 11/14/2021   Chronic pain 11/21/2016   COPD (chronic obstructive pulmonary disease)    Dizziness 12/24/2021   Early satiety 09/11/2021   Erectile dysfunction    Esophageal motility disorder 09/11/2021   Generalized anxiety disorder    GERD (gastroesophageal reflux disease)    History of benign positional vertigo    History of colonic polyps 09/11/2021   History of kidney stones    Hoarseness 09/11/2021   Hyperlipidemia    Hypertension    Hypogonadism male    prior use on Testim    Insomnia    Iron deficiency 09/11/2021   Left ventricular dysfunction 10/12/2018   Leg pain, left  11/14/2021   Lumbar disc disease    Major depressive disorder    Mild concentric left ventricular hypertrophy (LVH)    Muscle spasm 11/14/2021   Old inferior myocardial infarction 1995   OSA (obstructive sleep apnea) 11/12/2022   NPSG 10/21/22- AHI 28.7/ hr, desat to 89%/Mean 93%, body weight 165 lbs     Pain in both feet 08/10/2021   Peripheral vascular disease 12/25/2021   Pneumonia    Presbycusis of both ears 04/23/2018   Shortness of breath dyspnea    Slow transit constipation 09/11/2021   Spinal cord stimulator status 11/14/2021   Spondylolisthesis of lumbar region 02/28/2015   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 Anastasia Balo   CHEILECTOMY  10/16/2011   Procedure: CHEILECTOMY;  Surgeon: Janifer Meigs, MD;  Location: [facility];  Service: Orthopedics;  Laterality: Right;   CHOLECYSTECTOMY     COLONOSCOPY     CORONARY ANGIOPLASTY     1995 after MI   CORONARY ANGIOPLASTY WITH  STENT PLACEMENT     1999    ESOPHAGOGASTRODUODENOSCOPY  02/18/2012   Procedure: ESOPHAGOGASTRODUODENOSCOPY (EGD);  Surgeon: Brice Campi, MD;  Location: [facility];  Service: Endoscopy;  Laterality: N/A;   FOOT SURGERY     joint scraped, arthritis right foot   HERNIA REPAIR     LUMBAR DISC SURGERY     x2   right index finger  mass removed- arthritis     Dr. Aloha Arnold   [facility]  Right 05/18/2021   medial toe consistent with callus   SKIN BIOPSY Left 05/17/2021   actinic keratosis   SPINAL CORD STIMULATOR BATTERY EXCHANGE N/A 10/11/2021   Procedure: SPINAL CORD STIMULATOR BATTERY EXCHANGE;  Surgeon: Mort Ards, MD;  Location: [facility] OR;  Service: Orthopedics;  Laterality: N/A;  60 mins   SPINAL CORD STIMULATOR INSERTION N/A  11/21/2016   Procedure: LUMBAR SPINAL CORD STIMULATOR INSERTION;  Surgeon: Mort Ards, MD;  Location: [facility] OR;  Service: Orthopedics;  Laterality: N/A;  Requests 2.5 hrs   TONSILLECTOMY     1953   UPPER GI ENDOSCOPY  [id number]   VASECTOMY     1980   Patient Active Problem List   Diagnosis Date Noted   Attention or concentration deficit [R41.840] 05/30/2023   MCI (mild cognitive impairment) 05/30/2023   Generalized anxiety disorder    Acquired hallux rigidus, right 03/25/2023   OSA (obstructive sleep apnea) 11/12/2022   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   Peripheral vascular disease 12/25/2021   Dizziness 12/24/2021   Leg pain, left 11/14/2021   Chronic low back pain with sciatica 11/14/2021   Spinal cord stimulator status 11/14/2021   Muscle spasm 11/14/2021   Slow transit constipation 09/11/2021   Early satiety 09/11/2021   Hoarseness 09/11/2021   Iron deficiency 09/11/2021   History of colonic polyps 09/11/2021   Weight decreased 09/11/2021   Esophageal motility disorder 09/11/2021   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   Pain in both feet 08/10/2021   Atherosclerosis of aorta 08/30/2020   Mild concentric left ventricular hypertrophy (LVH)    Hypogonadism male    History of kidney stones    Erectile dysfunction    COPD (chronic obstructive pulmonary disease)    Acquired trigger finger of left middle finger 04/22/2019   Acquired trigger finger of right middle finger 04/22/2019   Left ventricular dysfunction 10/12/2018   Ankylosing spondylitis 10/09/2018   Presbycusis of both ears 04/23/2018   Chronic pain 11/21/2016   Arthritis 10/15/2016   Spondylolisthesis of lumbar region 02/28/2015   Hyperlipidemia    Lumbar disc disease    Hypertension    CAD (coronary artery disease) 05/19/2012   GERD (gastroesophageal reflux disease)    Major depressive disorder    Chronic asthma 05/17/2011   Allergic rhinitis due to pollen 08/08/2010   History of benign positional vertigo    ADHD,  predominantly hyperactive type     ONSET DATE: 08/19/2023 (referral date - based off screen recommendations)  REFERRING DIAG: Parkinson's Disease R41.3 (ICD-10-CM) - Memory impairment  THERAPY DIAG:  Muscle weakness (generalized)  Unsteadiness on feet  Other lack of coordination  Visuospatial deficit  Attention and concentration deficit  Abnormal posture  Rationale for Evaluation and Treatment: Rehabilitation  SUBJECTIVE:   SUBJECTIVE STATEMENT:  He has been painting the last couple days.   Pt accompanied by: self and wife, [name]  PERTINENT HISTORY: PD (diagnosed Spring 2024), ADHD, CAD, COPD, HTN, HLD, Dizziness, MI  PRECAUTIONS: Fall and Other: no driving, spinal nerve stimulator  WEIGHT BEARING RESTRICTIONS: No  PAIN:  Are you having pain? No  FALLS: Has patient fallen in last 6 months? 7fall  LIVING ENVIRONMENT: Lives with: lives with their spouse Carles Cheadle") and 3 cats Lives in:1 story home, 3-4 steps to enter (ramp on back). 4 steps down to washer/dryer but wife does Has following equipment at home: Otho Blitz - 2 wheeled  PLOF: Independent with basic ADLs, retired  PATIENT GOALS: get better  OBJECTIVE:  Note: Objective measures were completed at Evaluation unless otherwise noted.  HAND DOMINANCE: Right  ADLs: Overall ADLs: slower Transfers/ambulation related to ADLs: no device in house Eating: independent Grooming: independent UB Dressing: slower with jacket, difficulty and occasional assist with buttons LB Dressing: slower, assist for suspenders Toileting: independent Bathing: mod I - pt has grab bars Tub Shower transfers: walk in shower, but currently using tub/shower combo Equipment: Grab bars  IADLs: Shopping: pt accompanies wife occasionally Light housekeeping: pt washes dishes Meal Prep: pt cooks some [employer] mobility: relies on wife for transportation. Has not driven in a year since diagnosis Medication management: pt/wife loads  pillbox together, pt sometimes forgets to take meds Financial management: wife does Handwriting: 100% legible and Moderate micrographia  MOBILITY STATUS:  uses walker outside of home, mostly independent in home  POSTURE COMMENTS:  rounded shoulders and forward head   FUNCTIONAL OUTCOME MEASURES: (from screen on 08/19/23)  Fastening/unfastening 3 buttons: 50 sec Physical performance test: PPT#2 (simulated eating) 14 sec & PPT#4 (donning/doffing jacket): 22 sec  COORDINATION: (from screen on 08/19/23)  9 Hole Peg test: Right: 39 sec; Left: 40 sec Box and Blocks:  Right 37 blocks, Left 45 blocks Tremors: Resting and mild  UE ROM:  WFL   SENSATION: WFL  MUSCLE TONE: not tested  COGNITION: Overall cognitive status: Impaired and decreased memory . Pt also has ADHD (premorbid) and reports he's easily distracted.   OBSERVATIONS: Bradykinesia and Hypokinesia, stooped posture, shuffling feet, arthritis bilateral hands w/ MP ulnar drift                                                                                                                    TREATMENT :   Issued and reviewed w/ pt and wife ways to prevent future related PD complications (#4 and #6 do not apply to him therefore crossed out) - see pt instructions for details.   Pt/wife also issued community and online PD resources and reviewed.   Practiced donning/doffing jacket x 3 w/ cues (pt forgot his jacket therefore used clinic jacket w/ slick liner to increase ease)  PPT #4 = 17.46 sec.   PATIENT EDUCATION: Education details: see above Person educated: Patient and Spouse Education method: Explanation, Verbal cues, and Handouts Education comprehension: verbalized understanding and verbal cues required  HOME EXERCISE PROGRAM: 09/10/23: PWR! Hands, MP finger walking ex, coordination HEP 09/16/23: ADL strategies, handwriting strategies 09/30/23: bag ex's, memory strategies and ways to keep thinking skills sharp 10/09/23: ways  to prevent future PD complications and community resources  GOALS: Goals reviewed with patient?  Yes  SHORT TERM GOALS: Target date: 10/02/23  Independent with PD specific HEP (including finger walking ex to counteract MP ulnar drift)    Baseline: Goal status: MET  2.  Pt will verbalize understanding of ways to keep thinking skills sharp and ways to compensate for STM changes for medication management Baseline:  Goal status: MET  3.  Pt will write a sentence with no significant decrease in size and maintain 100% legibility  Baseline:  Goal status: MET -  using graph paper and writing in print for short sentences   4.  Pt will demonstrate increased ease with dressing as evidenced by decreasing PPT#4 (don/ doff jacket) to 18 secs or less  Baseline: 22 sec Goal status: MET (10/09/23 = 17.46 sec)   5.  Pt will verbalize understanding of adaptive strategies to increase ease with ADLS/IADLS   Baseline:  Goal status: MET   6.  Pt to improve RUE as evidenced by increasing Box & Blocks by 4 or more Baseline: 37 Goal status: NOT MET (10/07/23: 39 Blocks)  LONG TERM GOALS: Target date: 11/01/23  Pt will verbalize understanding of ways to prevent future PD related complications and appropriate community resources prn  Baseline:  Goal status: MET  2.  Pt will demonstrate improved ease with fastening buttons as evidenced by decreasing 3 button/unbutton time by 8 seconds or more Baseline: 50 sec Goal status: INITIAL  3.  Pt will demonstrate improved fine motor coordination for ADLs as evidenced by decreasing 9 hole peg test score for each hand and by 5 secs  Baseline: Rt = 39 sec, Lt = 40 sec.  Goal status: INITIAL  4.  Pt will write a paragraph with no significant decrease in size and maintain 100% legibility  Baseline:  Goal status: IN PROGRESS  5.  Pt will consistently don suspenders I'ly Baseline: needs assist Goal status: DEFERRED (recommended belt or elastic style paints)    ASSESSMENT:  CLINICAL IMPRESSION: Patient gradually progressing, however requires cues for safety - less overall cueing however. Pt will continue to benefit from skilled O.T. to address remaining goals and continue to reinforce safety with ADLS  PERFORMANCE DEFICITS: in functional skills including ADLs, IADLs, coordination, dexterity, proprioception, strength, Fine motor control, Gross motor control, mobility, balance, body mechanics, decreased knowledge of precautions, decreased knowledge of use of DME, vision, and UE functional use, cognitive skills including attention, memory, problem solving, and safety awareness, and psychosocial skills including coping strategies.   IMPAIRMENTS: are limiting patient from ADLs, IADLs, leisure, and social participation.   COMORBIDITIES:  may have co-morbidities  that affects occupational performance. Patient will benefit from skilled OT to address above impairments and improve overall function.  REHAB POTENTIAL: Good  PLAN:  OT FREQUENCY: 2x/week  OT DURATION: 8 weeks  PLANNED INTERVENTIONS: 97535 self care/ADL training, 96045 therapeutic exercise, 97530 therapeutic activity, 97112 neuromuscular re-education, 97140 manual therapy, 97018 paraffin, 40981 fluidotherapy, 97010 moist heat, 97034 contrast bath, 97760 Orthotics management and training, 19147 Splinting (initial encounter), [id number] Subsequent splinting/medication, passive range of motion, functional mobility training, visual/perceptual remediation/compensation, energy conservation, coping strategies training, patient/family education, and DME and/or AE instructions  RECOMMENDED OTHER SERVICES: none at this time  CONSULTED AND AGREED WITH PLAN OF CARE: Patient and family member/caregiver  PLAN FOR NEXT SESSION: practice buttons, coordination, and writing 10th progress note due   Velinda Getting, OT 10/09/2023, 10:26 AM

## 2023-10-09 NOTE — Patient Instructions (Signed)
 Ways to prevent future Parkinson's related complications:  1.   Exercise regularly/daily. Challenge yourself SAFELY and try to get different types of exercise including: PWR! Moves, Cardio (cycling), strength and balance training, and stretches (Yoga)   2.   Focus on BIGGER movements during daily activities- really reach overhead, straighten elbows and extend fingers  3.   When dressing (especially jacket/coat) or reaching for your seatbelt make sure to use your body to assist by twisting while you reach and looking at where you are reaching - this can help to minimize stress on the shoulder and reduce the risk of a rotator cuff tear  4.   Swing your arms when you walk (unless using walker)! People with PD are at increased risk for frozen shoulder and swinging your arms can reduce this risk.  5. Drink plenty of water and eat a high fiber diet (fresh fruits and veggies, nuts/seeds, beans, some fish). Avoid canned foods, reduce sugar intake and dairy when possible  6. Do NOT take your Parkinson's medication around meals (take at least 30 min before a meal, or 1 hour after a meal), especially protein as it blocks the absorption of the medicine and will not work effectively  7. Keep your feet apart when you are standing (wider stance) to allow you to have better balance and to reach further with your arms. Also make sure your feet are apart before standing up.   8. Stay social! - Go out with friends, play card games with others, join a community group or community PD exercise class  9. Get a good night's sleep! Staying active during the day and developing a good bedtime routine can help with this.   10. Communicate with your neurologist any concerns and maintain overall health - keep blood pressure regulated, reduce stress/anxiety, good nutrition/gut health, etc.

## 2023-10-13 NOTE — Therapy (Unsigned)
 OUTPATIENT SPEECH LANGUAGE PATHOLOGY PARKINSON'S TREATMENT (PROGRESS NOTE)   Patient Name: [name] MRN: [id number] DOB:[birth year], 78 y.o., male Today's Date: 10/14/2023  PCP: Watson Hacking, MD REFERRING PROVIDER: Shirline Dover, DO  END OF SESSION:  End of Session - 10/14/23 1143     Visit Number 10    Number of Visits 17    Date for SLP Re-Evaluation 10/27/23    Authorization Type [employer] medicare    SLP Start Time 1234    SLP Stop Time  1315    SLP Time Calculation (min) 41 min    Activity Tolerance Patient tolerated treatment well             Speech Therapy Progress Note  Dates of Reporting Period: 09/01/23 to current  Objective Reports of Subjective Statement: Pt has been seen for 10 ST visits targeting dysarthria and cognitive communication.   Objective Measurements: Demonstrates ability to optimize volume and vocal clarity on structured tasks but continues to required usual modeling and cues to consistently utilize during discourse.   Goal Update: see goals below  Plan: continue per POC  Reason Skilled Services are Required: Pt would continue to benefit from skilled ST intervention as ST goals not yet met.     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 Anastasia Balo   Chronic asthma 05/17/2011   Chronic low back pain with sciatica 11/14/2021   Chronic pain 11/21/2016   COPD (chronic obstructive pulmonary disease)     Dizziness 12/24/2021   Early satiety 09/11/2021   Erectile dysfunction    Esophageal motility disorder 09/11/2021   Generalized anxiety disorder    GERD (gastroesophageal reflux disease)    History of benign positional vertigo    History of colonic polyps 09/11/2021   History of kidney stones    Hoarseness 09/11/2021   Hyperlipidemia    Hypertension    Hypogonadism male    prior use on Testim    Insomnia    Iron deficiency 09/11/2021   Left ventricular dysfunction 10/12/2018   Leg pain, left 11/14/2021   Lumbar disc disease    Major depressive disorder    Mild concentric left ventricular hypertrophy (LVH)    Muscle spasm 11/14/2021   Old inferior myocardial infarction 1995   OSA (obstructive sleep apnea) 11/12/2022   NPSG 10/21/22- AHI 28.7/ hr, desat to 89%/Mean 93%, body weight 165 lbs     Pain in both feet 08/10/2021   Peripheral vascular disease 12/25/2021   Pneumonia    Presbycusis of both ears 04/23/2018   Shortness of breath dyspnea    Slow transit constipation 09/11/2021   Spinal cord stimulator status 11/14/2021   Spondylolisthesis of lumbar region 02/28/2015   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 Anastasia Balo   CHEILECTOMY  10/16/2011   Procedure: CHEILECTOMY;  Surgeon: Janifer Meigs, MD;  Location: [facility];  Service: Orthopedics;  Laterality: Right;   CHOLECYSTECTOMY     COLONOSCOPY     CORONARY ANGIOPLASTY     1995 after MI   CORONARY ANGIOPLASTY WITH STENT PLACEMENT     1999    ESOPHAGOGASTRODUODENOSCOPY  02/18/2012   Procedure: ESOPHAGOGASTRODUODENOSCOPY (EGD);  Surgeon: Brice Campi, MD;  Location: [facility];  Service: Endoscopy;  Laterality: N/A;   FOOT SURGERY     joint  scraped, arthritis right foot   HERNIA REPAIR     LUMBAR DISC SURGERY     x2   right index finger  mass removed- arthritis     Dr. Aloha Arnold   [facility]  Right 05/18/2021   medial toe consistent with callus   SKIN BIOPSY Left 05/17/2021   actinic keratosis   SPINAL CORD STIMULATOR BATTERY EXCHANGE N/A 10/11/2021   Procedure: SPINAL CORD STIMULATOR BATTERY EXCHANGE;  Surgeon: Mort Ards, MD;  Location: [facility];  Service: Orthopedics;  Laterality: N/A;  60 mins   SPINAL CORD STIMULATOR INSERTION N/A 11/21/2016   Procedure: LUMBAR SPINAL CORD STIMULATOR INSERTION;  Surgeon: Mort Ards, MD;  Location: [facility];  Service: Orthopedics;  Laterality: N/A;  Requests 2.5 hrs   TONSILLECTOMY     1953   UPPER GI ENDOSCOPY  [id number]   VASECTOMY     1980   Patient Active Problem List   Diagnosis Date Noted   Attention or concentration deficit [R41.840] 05/30/2023   MCI (mild cognitive impairment) 05/30/2023   Generalized anxiety disorder    Acquired hallux rigidus, right 03/25/2023   OSA (obstructive sleep apnea) 11/12/2022   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   Peripheral vascular disease 12/25/2021   Dizziness 12/24/2021   Leg pain, left 11/14/2021   Chronic low back pain with sciatica 11/14/2021   Spinal cord stimulator status 11/14/2021   Muscle spasm 11/14/2021   Slow transit constipation 09/11/2021   Early satiety 09/11/2021   Hoarseness 09/11/2021   Iron deficiency 09/11/2021   History of colonic polyps 09/11/2021   Weight decreased 09/11/2021   Esophageal motility disorder 09/11/2021   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   Pain in both feet 08/10/2021   Atherosclerosis of aorta 08/30/2020   Mild concentric left ventricular hypertrophy (LVH)    Hypogonadism male    History of kidney stones    Erectile dysfunction    COPD (chronic obstructive pulmonary disease)    Acquired trigger finger of left middle finger 04/22/2019   Acquired trigger finger of right middle finger 04/22/2019   Left  ventricular dysfunction 10/12/2018   Ankylosing spondylitis 10/09/2018   Presbycusis of both ears 04/23/2018   Chronic pain 11/21/2016   Arthritis 10/15/2016   Spondylolisthesis of lumbar region 02/28/2015   Hyperlipidemia    Lumbar disc disease    Hypertension    CAD (coronary artery disease) 05/19/2012   GERD (gastroesophageal reflux disease)    Major depressive disorder    Chronic asthma 05/17/2011   Allergic rhinitis due to pollen 08/08/2010   History of benign positional vertigo    ADHD, predominantly hyperactive type     ONSET DATE: 08/19/2023 - referral date  REFERRING DIAG: R49.0 (ICD-10-CM) - Dysphonia  THERAPY DIAG:  Dysarthria and anarthria  Rationale for Evaluation and Treatment: Rehabilitation  SUBJECTIVE:  SUBJECTIVE STATEMENT: "well [name] constantly reminds me" Pt accompanied by: significant other [name]  PERTINENT HISTORY: Dr. Winferd Hatter has previously theorized a vascular parkinsonian presentation being more likely than idiopathic Parkinson's disease. [name] is known to us  from prior course of ST 11/29/22-02/04/23.   Neuropsych eval Nov 2024: Mr. Mayabb' pattern of performance is suggestive of impairment surrounding executive functioning. Performance variability was further exhibited across processing speed, complex attention, semantic fluency, and both encoding (i.e., learning) and delayed retrieval aspects of memory.         MCI             -suspect due to meds             -Neurocognitive testing with Dr. Kitty Perkins confirmed MCI in November, 2024.  He mentioned medications as a role in diminishing cognition.  PAIN:  Are you having pain? No  PATIENT REPORTED OUTCOME MEASURES (PROM): - Total 18 The Communicative Participation Item Bank        Does your condition interfere with... Pt Rating   ...talking with people you know 2   ...communicating when you need to say something quickly 3   ...talking with people you do not know 2   ...communicating when you are out in  your community 1   ...asking questions in a conversation 2   ....communicating in a small group of people 2   ...having a long conversation 2   ...giving detailed infomrmation 3   ...getting your turn in a fast moving conversation 1   ...trying to persuade a friend or family member to see a different point of view 2  3= Not at all; 2=A little; 1=Quite a bit; 0=Very much                                                                                                                             TREATMENT DATE:  10/14/23: Arrived alone today. Consistent fading to intermittent cues required to maintain intent speaking in conversation. Volume average improved with topic of interest. Affirmed positive maintenance of volume with specific topics. Re-educated memory and attention strategies today, given pt demonstrating reduced recall due to internal and external distractions during discourse. Handout provided to aid recall and carryover.   10/07/23: Entered with hoarse voice without use of intentional speech. Re-educated importance of intentional speech to optimize vocal intensity and clarity as pt noted with persistently reduced volume and intermittent whispering. Utilized beginning exercises of Speak Out! Program to increase volume and clarity. Usual mod/max A required to aid accuracy and effort. Benefited from cues to stop and process task prior to initiating for improved performance. Briefly discussed safety awareness per discussion of patient demonstrating poor safety judgement at home. Demonstrated and instructed verbal framework to aid patient's problem solving and awareness to facilitate safer actions. Pt and wife verbalized understanding. Added 2 additional ST visits to maximize patient's understanding and carryover of targeted techniques.   10/03/23: Pt reports "a little" completion of  Speak Out! exercises on a daily basis, SLP reminding pt of value and need for consistent practice. No hoarseness noted  this date. SLP initiated Speak Out! Lesson #7 this date with usual model and consistent mod-A to maximize pt success maintaining increased vocal intensity and clear speech throughout each exercise. Pt benefited from mod/max-A and consistent model to to maintain improved vocal quality in conversation.  Averages this date: Sustained ah: 85 dB  Reading: 78 dB  Cognitive Exercise: 75 dB  Side conversation: 69 dB with intermittent cues  Pt demonstrates difficulty recalling names of his cats in conversation and reports ongoing challenges with memory. SLP initiated instruction of memory strategies to aid pt concerns, hand out provided to facilitate carryover. Planning to continue focus on memory strategies and initiate development of plan/schedule next session to support HEP completion.   09/30/23: Enters with moderate hoarseness today. Wife reported persistent hoarseness over weekend despite reported completion of HEP. SLP inquired about seasonal allergies and potential straining during Speak Out! Program. Pt denied both. Instructed Lesson #5 this date. Usual model and consistent mod A provided to utilize intentional volume and projection to reduce hoarseness. In subsequent conversation, frequent mod/max A required to maintain intent and forward projection. Limited awareness of volume decay and hoarseness exhibited. Vocal quality did improve with SLP cues and further instruction of targeted techniques.   09/23/23: Pt shared increased completion of HEP, practicing "more days" as opposed to every other day. SLP reinforced importance of daily practice and lead pt through Speak Out lesson #3 this date. Pt benefiting from occasional min-A and rare model to complete exercises with increased intensity and improved vocal quality exhibited. Averages this date: Sustained ah: 85 dB  Counting: 83 dB  Reading: 84 dB  Cognitive Exercise: 78 dB  Side conversation: 71 dB with intermittent cues  Pt expresses increased  difficulty "finding my words" this session. SLP initiated pt education of descriptive strategies to aid word retrieval. SLP provided additional education on memory strategy of utilizing visual aids d/t reported concerns with recalling music notes. Pt endorses understanding.  09/19/23: Addressed dysarthria via Speak Out lesson #2. Pt required usual mod to max-A and modeling for accurate completion of therapy exercises. Pt averages the following:   Sustained ah: 84  Counting: 82  Reading: 79  Cognitive Exercise: 75 with usual mod verbal cues and gesture cues Minimal carryover for side comments or conversational speech though pt is able to repeat prior utterances with verbal cues for use of intent. Reviewed cough alternatives, as pt demonstrating coughing this date, endorsing strong cough sensation. Minimal effectiveness in utilizing alternatives this date. Will continue to address.   09/16/23: Exhibited initial throat clear with awareness of need to reduce habit. Pt able to carryover throat clear alternatives with rare min x4 additional opportunities this date. Recently received new Speak Out! Workbook. Has watched some online lessons with inconsistent completion. Re-educated recommendations for daily practice, in which pt verbalized understanding. Indicated feeling self-conscious completing with wife and Librarian, academic. Assisted with re-framing assistance to aid confidence and carryover versus judgement. Re-educated Lesson #1. Usual model and mod A provided to complete targeted tasks and maintain intent to achieve dB levels.  Pt averages the following volume levels:  Sustained AH: 86   Counting:84  Reading: 82  Cognitive Exercise: 79 with usual mod verbal cues and gesture cues Required frequent mod gestures and verbal cues, modeling for carryover of intent /volume when conversation was interjected during and after Speak Out! Lesson  Of note,  pt instructed to bring own water  bottle to next ST session to aid  hydration.   09/10/23: Mitch completed online home practice session on [employer] website. He reports his wife signed up for [employer] as well.  Targeted volume and intelligibility using Speak Out! Lesson E [employer]. Pt required usual min verbal cues, modeling, for volume and breath support.  Pt averages the following volume levels:  Sustained AH: 85 - cues required to keep volume and pitch steady and consistent  Counting:85  Reading (phrases): 80  Cognitive Exercise: 76 with usual mod verbal cues and gesture cues Required frequent mod gesture and verbal cues, modeling for carryover of intent /volume when conversation was interjected into Speak Out! Lesson   09/08/23: They have not signed up for e Library on [employer] ([employer]) website - Instructed them to do this - submitted [name]'s email to [employer] for access to [employer]. They both watched the required webinar on [employer] website. Targeted volume and intelligibility using Speak Out! Lesson April Week 1 on [employer] e Libary. Pt required occasional min  verbal cues, modeling, for volume and breath support. Pt averages the following volume levels:  Sustained AH: 85  Counting:80dB with occasional min cues for smooth and connected counting Pitch glides required usual mod modeling, gesture cues to achieve pitch change and prolong glides  Reading (phrases): 75  Cognitive Exercise: 72dB Required usual lmod verbal cues, modeling for carryover of intent /volume answering simple questions following structured practice.  Completed CPIB PROM - See above    09/01/23 (eval day): Eval completed. Initiated training in compensatory strategies for slow processing - see Patient Instructions. Initiated Speak Out! Lesson one to review and instruct on HEP - With occasional min  A, Mitch achieved the dB targets for warm ups, sustained Ah and counting. He required usual mod A to maintain volume on glides and conversation exercises. Demonstrated the  Parkinson [employer] website and instructed [name] and Carles Cheadle to watch the What is Parkinsons and Living with Parkinsons webinar. Introduced the on line home practice sessions as well. In conversation, [name] responded well to frequent mod verbal cues and visual cues to carryover intentional speech to average 70dB. Provided a log to track completion of HEP   PATIENT EDUCATION: Education details: see above Person educated: Patient and Spouse Education method: Explanation, Demonstration, Verbal cues, and Handouts Education comprehension: verbalized understanding, returned demonstration, verbal cues required, and needs further education  HOME EXERCISE PROGRAM: Speak Out! Therapy Program to be completed twice daily    GOALS: Goals reviewed with patient? Yes  SHORT TERM GOALS: Target date: 09/29/23  Pt will complete HEP for dysarthria with occasional min A Baseline: Goal status: NOT MET  2.  Pt will average 72dB 18/20 sentences Baseline:  Goal status: MET  3.  Pt will complete HEP twice a week 5/7 days outside of ST Baseline:  Goal status: PARTIALLY MET  4.  Pt will carryover 2 compensatory strategies to support processing speed in conversation Baseline:  Goal status: NO MET  5.  Pt will average 72dB over 8 minute conversation with occasional min A Baseline:  Goal status: PARTIALLY MET   LONG TERM GOALS: Target date: 10/27/23  Pt will complete HEP for dysarthria with mod I Baseline:  Goal status:ONGOING  2.  Pt will ID and correct hoarse low volume with supervision cues Baseline:  Goal status: MET  3.  Pt will average 72dB over 15 minute conversation Baseline:  Goal status:  MET  4.  Pt will participate in 2 on line home practice sessions on the Parkinson [employer] website Baseline:  Goal status: ONGOING  5.  Pt will verbalize a plan/schedule to continue daily Speak Out! Practice after d/c from ST Baseline:  Goal status:ONGOING  6.  Pt will improve score on  Communication Participation Item Bank Baseline: 18 Goal status: ONGOING  ASSESSMENT:  CLINICAL IMPRESSION: Patient is a 78 y.o. 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 Mitch endorses persistent hoarse voice, with limited independent carryover of SLP recommendations to optimize intentional, projected speech. Continued pt education and instruction of Speak Out lessons and principles. [name] was easily stimulable for higher intensity voice which eliminate hoarse quality and improved intelligibility; however, frequent cues required to achieve and maintain targeted intensity and improved quality. I recommend skilled ST to maximize intelligibility for safety, independence and to reduce caregiver burden.  OBJECTIVE IMPAIRMENTS: Objective impairments include attention, memory, executive functioning, expressive language, and dysarthria. These impairments are limiting patient from effectively communicating at home and in community.Factors affecting potential to achieve goals and functional outcome are medical prognosis.. Patient will benefit from skilled SLP services to address above impairments and improve overall function.  REHAB POTENTIAL: Good  PLAN:  SLP FREQUENCY: 2x/week  SLP DURATION: 8 weeks  PLANNED INTERVENTIONS: Aspiration precaution training, Diet toleration management , Language facilitation, Environmental controls, Cueing hierachy, Cognitive reorganization, Internal/external aids, Functional tasks, Multimodal communication approach, SLP instruction and feedback, Compensatory strategies, Patient/family education, and [id number] Treatment of speech (30 or 45 min)     Tamar Fairly, CCC-SLP 10/14/2023, 12:35 PM

## 2023-10-14 ENCOUNTER — Ambulatory Visit: Admitting: Physical Therapy

## 2023-10-14 ENCOUNTER — Ambulatory Visit: Admitting: Occupational Therapy

## 2023-10-14 ENCOUNTER — Encounter: Admitting: Speech Pathology

## 2023-10-14 ENCOUNTER — Ambulatory Visit: Payer: Self-pay

## 2023-10-14 DIAGNOSIS — R41840 Attention and concentration deficit: Secondary | ICD-10-CM

## 2023-10-14 DIAGNOSIS — R471 Dysarthria and anarthria: Secondary | ICD-10-CM

## 2023-10-14 DIAGNOSIS — M6281 Muscle weakness (generalized): Secondary | ICD-10-CM

## 2023-10-14 DIAGNOSIS — R278 Other lack of coordination: Secondary | ICD-10-CM

## 2023-10-14 NOTE — Patient Instructions (Addendum)
 Memory and attention go hand in hand  If you aren't paying attention, you may not remember  Memory Strategies  W - Write it down A - Associate it with something R - Repeat it M - Mental Image  Attention Strategies  Monitor your fatigue and schedule in breaks  Allow plenty of time to achieve tasks  Adapt your environment to eliminate distractions  Evaluate and monitor distraction before starting a task  Develop systems of alarms and reminders to keep focussed  Manage internal distractions  Try and attempt one task at a time  Allow yourself "brain breaks" between tasks  Self monitor during conversations  Try and use active listening skills during conversations  Make other people aware of your difficulties  Work together with house-mates to adapt your environment

## 2023-10-14 NOTE — Therapy (Signed)
 OUTPATIENT OCCUPATIONAL THERAPY [facility] T[facility]ATMENT & PROGRESS NOTE  Patient Name: [name] MRN: [id number] DOB:27-Apr-1946, 78 y.o., male Today's Date: 10/14/2023  PCP: Watson Hacking, MD REFERRING PROVIDER: Shirline Dover, DO  END OF SESSION:  OT End of Session - 10/14/23 1155     Visit Number 10    Number of Visits 16    Date for OT Re-Evaluation 11/01/23    Authorization Type [employer] MCR - Auth required    Progress Note Due on Visit 20    OT Start Time 1151    OT Stop Time 1230    OT Time Calculation (min) 39 min    Activity Tolerance Patient tolerated treatment well    Behavior During Therapy [facility] for tasks assessed/performed             Past Medical History:  Diagnosis Date   Acquired hallux rigidus, right 03/25/2023   Acquired trigger finger of left middle finger 04/22/2019   Acquired trigger finger of right middle finger 04/22/2019   ADHD, predominantly hyperactive type    Allergic rhinitis due to pollen 08/08/2010   Allergy  vaccine 1:50,000 03/25/08; 1:10 03/27/09 > 1:2 GH >> DC'd 01/04/2015 for observation        Anemia    takes Fe -    Anginal pain    Ankylosing spondylitis 10/09/2018   Arthritis    Atherosclerosis of aorta 08/30/2020   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 Anastasia Balo   Chronic asthma 05/17/2011   Chronic low back pain with sciatica 11/14/2021   Chronic pain 11/21/2016   COPD (chronic obstructive pulmonary disease)    Dizziness 12/24/2021   Early satiety 09/11/2021   Erectile dysfunction    Esophageal motility disorder 09/11/2021   Generalized anxiety disorder    GERD (gastroesophageal reflux disease)    History of benign positional vertigo    History of colonic polyps 09/11/2021   History of kidney stones    Hoarseness 09/11/2021   Hyperlipidemia    Hypertension    Hypogonadism male    prior use on Testim    Insomnia    Iron deficiency 09/11/2021   Left ventricular dysfunction 10/12/2018    Leg pain, left 11/14/2021   Lumbar disc disease    Major depressive disorder    Mild concentric left ventricular hypertrophy (LVH)    Muscle spasm 11/14/2021   Old inferior myocardial infarction 1995   OSA (obstructive sleep apnea) 11/12/2022   NPSG 10/21/22- AHI 28.7/ hr, desat to 89%/Mean 93%, body weight 165 lbs     Pain in both feet 08/10/2021   Peripheral vascular disease 12/25/2021   Pneumonia    Presbycusis of both ears 04/23/2018   Shortness of breath dyspnea    Slow transit constipation 09/11/2021   Spinal cord stimulator status 11/14/2021   Spondylolisthesis of lumbar region 02/28/2015   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 Anastasia Balo   CHEILECTOMY  10/16/2011   Procedure: CHEILECTOMY;  Surgeon: Janifer Meigs, MD;  Location: [facility];  Service: Orthopedics;  Laterality: Right;   CHOLECYSTECTOMY     COLONOSCOPY     CORONARY ANGIOPLASTY     1995 after MI  CORONARY ANGIOPLASTY WITH STENT PLACEMENT     1999    ESOPHAGOGASTRODUODENOSCOPY  02/18/2012   Procedure: ESOPHAGOGASTRODUODENOSCOPY (EGD);  Surgeon: Brice Campi, MD;  Location: [facility];  Service: Endoscopy;  Laterality: N/A;   FOOT SURGERY     joint scraped, arthritis right foot   HERNIA REPAIR     LUMBAR DISC SURGERY     x2   right index finger  mass removed- arthritis     Dr. Aloha Arnold   [facility]  Right 05/18/2021   medial toe consistent with callus   SKIN BIOPSY Left 05/17/2021   actinic keratosis   SPINAL CORD STIMULATOR BATTERY EXCHANGE N/A 10/11/2021   Procedure: SPINAL CORD STIMULATOR BATTERY EXCHANGE;  Surgeon: Mort Ards, MD;  Location: [facility] OR;  Service: Orthopedics;  Laterality: N/A;  60 mins   SPINAL CORD STIMULATOR  INSERTION N/A 11/21/2016   Procedure: LUMBAR SPINAL CORD STIMULATOR INSERTION;  Surgeon: Mort Ards, MD;  Location: [facility] OR;  Service: Orthopedics;  Laterality: N/A;  Requests 2.5 hrs   TONSILLECTOMY     1953   UPPER GI ENDOSCOPY  [id number]   VASECTOMY     1980   Patient Active Problem List   Diagnosis Date Noted   Attention or concentration deficit [R41.840] 05/30/2023   MCI (mild cognitive impairment) 05/30/2023   Generalized anxiety disorder    Acquired hallux rigidus, right 03/25/2023   OSA (obstructive sleep apnea) 11/12/2022   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   Peripheral vascular disease 12/25/2021   Dizziness 12/24/2021   Leg pain, left 11/14/2021   Chronic low back pain with sciatica 11/14/2021   Spinal cord stimulator status 11/14/2021   Muscle spasm 11/14/2021   Slow transit constipation 09/11/2021   Early satiety 09/11/2021   Hoarseness 09/11/2021   Iron deficiency 09/11/2021   History of colonic polyps 09/11/2021   Weight decreased 09/11/2021   Esophageal motility disorder 09/11/2021   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   Pain in both feet 08/10/2021   Atherosclerosis of aorta 08/30/2020   Mild concentric left ventricular hypertrophy (LVH)    Hypogonadism male    History of kidney stones    Erectile dysfunction    COPD (chronic obstructive pulmonary disease)    Acquired trigger finger of left middle finger 04/22/2019   Acquired trigger finger of right middle finger 04/22/2019   Left ventricular dysfunction 10/12/2018   Ankylosing spondylitis 10/09/2018   Presbycusis of both ears 04/23/2018   Chronic pain 11/21/2016   Arthritis 10/15/2016   Spondylolisthesis of lumbar region 02/28/2015   Hyperlipidemia    Lumbar disc disease    Hypertension    CAD (coronary artery disease) 05/19/2012   GERD (gastroesophageal reflux disease)    Major depressive disorder    Chronic asthma 05/17/2011   Allergic rhinitis due to pollen 08/08/2010   History of benign positional  vertigo    ADHD, predominantly hyperactive type     ONSET DATE: 08/19/2023 (referral date - based off screen recommendations)  REFERRING DIAG: Parkinson's Disease R41.3 (ICD-10-CM) - Memory impairment  THERAPY DIAG:  Muscle weakness (generalized)  Other lack of coordination  Attention and concentration deficit  Rationale for Evaluation and Treatment: Rehabilitation  SUBJECTIVE:   SUBJECTIVE STATEMENT:  He is having a slow day.   Pt accompanied by: self   PERTINENT HISTORY: PD (diagnosed Spring 2024), ADHD, CAD, COPD, HTN, HLD, Dizziness, MI  PRECAUTIONS: Fall and Other: no driving, spinal nerve stimulator  WEIGHT BEARING RESTRICTIONS: No  PAIN:  Are you having pain? No  FALLS: Has patient  fallen in last 6 months? 80fall  LIVING ENVIRONMENT: Lives with: lives with their spouse Carles Cheadle") and 3 cats Lives in:1 story home, 3-4 steps to enter (ramp on back). 4 steps down to washer/dryer but wife does Has following equipment at home: Otho Blitz - 2 wheeled  PLOF: Independent with basic ADLs, retired  PATIENT GOALS: get better  OBJECTIVE:  Note: Objective measures were completed at Evaluation unless otherwise noted.  HAND DOMINANCE: Right  ADLs: Overall ADLs: slower Transfers/ambulation related to ADLs: no device in house Eating: independent Grooming: independent UB Dressing: slower with jacket, difficulty and occasional assist with buttons LB Dressing: slower, assist for suspenders Toileting: independent Bathing: mod I - pt has grab bars Tub Shower transfers: walk in shower, but currently using tub/shower combo Equipment: Grab bars  IADLs: Shopping: pt accompanies wife occasionally Light housekeeping: pt washes dishes Meal Prep: pt cooks some [employer] mobility: relies on wife for transportation. Has not driven in a year since diagnosis Medication management: pt/wife loads pillbox together, pt sometimes forgets to take meds Financial management: wife  does Handwriting: 100% legible and Moderate micrographia  MOBILITY STATUS:  uses walker outside of home, mostly independent in home  POSTURE COMMENTS:  rounded shoulders and forward head   FUNCTIONAL OUTCOME MEASURES: (from screen on 08/19/23)  Fastening/unfastening 3 buttons: 50 sec Physical performance test: PPT#2 (simulated eating) 14 sec & PPT#4 (donning/doffing jacket): 22 sec  COORDINATION: (from screen on 08/19/23)  9 Hole Peg test: Right: 39 sec; Left: 40 sec Box and Blocks:  Right 37 blocks, Left 45 blocks Tremors: Resting and mild  UE ROM:  WFL   SENSATION: WFL  MUSCLE TONE: not tested  COGNITION: Overall cognitive status: Impaired and decreased memory . Pt also has ADHD (premorbid) and reports he's easily distracted.   OBSERVATIONS: Bradykinesia and Hypokinesia, stooped posture, shuffling feet, arthritis bilateral hands w/ MP ulnar drift                                                                                                                    TREATMENT :   Therapist reviewed goals with patient and updated patient progression.  No additional functional limitations identified. Therapist reviewed goals with patient and updated patient progression.  No additional functional limitations identified. Additional time spent reviewing strategies for buttoning and writing as well as safe ambulation with walker.   PATIENT EDUCATION: Education details: see above Person educated: Patient Education method: Explanation and Verbal cues Education comprehension: verbalized understanding and verbal cues required  HOME EXERCISE PROGRAM: 09/10/23: PWR! Hands, MP finger walking ex, coordination HEP 09/16/23: ADL strategies, handwriting strategies 09/30/23: bag ex's, memory strategies and ways to keep thinking skills sharp 10/09/23: ways to prevent future PD complications and community resources  GOALS: Goals reviewed with patient? Yes  SHORT TERM GOALS: Target date:  10/02/23  Independent with PD specific HEP (including finger walking ex to counteract MP ulnar drift)    Baseline: Goal status: MET  2.  Pt will verbalize understanding of ways to  keep thinking skills sharp and ways to compensate for STM changes for medication management Baseline:  Goal status: MET  3.  Pt will write a sentence with no significant decrease in size and maintain 100% legibility  Baseline:  Goal status: MET -  using graph paper and writing in print for short sentences   4.  Pt will demonstrate increased ease with dressing as evidenced by decreasing PPT#4 (don/ doff jacket) to 18 secs or less  Baseline: 22 sec Goal status: MET (10/09/23 = 17.46 sec)   5.  Pt will verbalize understanding of adaptive strategies to increase ease with ADLS/IADLS   Baseline:  Goal status: MET   6.  Pt to improve RUE as evidenced by increasing Box & Blocks by 4 or more Baseline: 37 10/07/23: 39 Blocks 10/14/2023: 45 blocks Goal status: MET   LONG TERM GOALS: Target date: 11/01/23  Pt will verbalize understanding of ways to prevent future PD related complications and appropriate community resources prn  Baseline:  Goal status: MET  2.  Pt will demonstrate improved ease with fastening buttons as evidenced by decreasing 3 button/unbutton time by 8 seconds or more Baseline: 50 sec 10/14/2023: pt attempted on button up polo style shirt he was wearing and at table level but had difficulty as he got distracted.  Goal status: INITIAL  3.  Pt will demonstrate improved fine motor coordination for ADLs as evidenced by decreasing 9 hole peg test score for each hand and by 5 secs  Baseline: Rt = 39 sec, Lt = 40 sec.  10/14/2023: Rt = 39 sec, Lt = 54 sec.  Goal status: INITIAL  4.  Pt will write a paragraph with no significant decrease in size and maintain 100% legibility  Baseline:  Goal status: IN PROGRESS  5.  Pt will consistently don suspenders I'ly Baseline: needs assist Goal status: DEFERRED  (recommended belt or elastic style paints)   ASSESSMENT:  CLINICAL IMPRESSION: This 10th progress note is for dates: 09/01/2023 to 10/14/2023. Pt has met 6/6 STGs and 1/4 LTGs. Pt making progress towards goals as expected and continues to benefit from skilled OT services in the outpatient setting to work towards remaining goals or until max rehab potential is met.   PERFORMANCE DEFICITS: in functional skills including ADLs, IADLs, coordination, dexterity, proprioception, strength, Fine motor control, Gross motor control, mobility, balance, body mechanics, decreased knowledge of precautions, decreased knowledge of use of DME, vision, and UE functional use, cognitive skills including attention, memory, problem solving, and safety awareness, and psychosocial skills including coping strategies.   IMPAIRMENTS: are limiting patient from ADLs, IADLs, leisure, and social participation.   COMORBIDITIES:  may have co-morbidities  that affects occupational performance. Patient will benefit from skilled OT to address above impairments and improve overall function.  REHAB POTENTIAL: Good  PLAN:  OT FREQUENCY: 2x/week  OT DURATION: 8 weeks  PLANNED INTERVENTIONS: 97535 self care/ADL training, 62952 therapeutic exercise, 97530 therapeutic activity, 97112 neuromuscular re-education, 97140 manual therapy, 97018 paraffin, 84132 fluidotherapy, 97010 moist heat, 97034 contrast bath, 97760 Orthotics management and training, 44010 Splinting (initial encounter), [id number] Subsequent splinting/medication, passive range of motion, functional mobility training, visual/perceptual remediation/compensation, energy conservation, coping strategies training, patient/family education, and DME and/or AE instructions  RECOMMENDED OTHER SERVICES: none at this time  CONSULTED AND AGREED WITH PLAN OF CARE: Patient and family member/caregiver  PLAN FOR NEXT SESSION: practice buttons/jacket, coordination, and writing   Altamease Asters, OT 10/14/2023, 1:47 PM

## 2023-10-16 ENCOUNTER — Ambulatory Visit: Admitting: Occupational Therapy

## 2023-10-16 ENCOUNTER — Encounter: Payer: Self-pay | Admitting: Occupational Therapy

## 2023-10-16 DIAGNOSIS — M6281 Muscle weakness (generalized): Secondary | ICD-10-CM | POA: Diagnosis not present

## 2023-10-16 DIAGNOSIS — R278 Other lack of coordination: Secondary | ICD-10-CM

## 2023-10-16 DIAGNOSIS — R2681 Unsteadiness on feet: Secondary | ICD-10-CM

## 2023-10-16 DIAGNOSIS — R41840 Attention and concentration deficit: Secondary | ICD-10-CM

## 2023-10-16 NOTE — Therapy (Signed)
 OUTPATIENT OCCUPATIONAL THERAPY [facility] & PROGRESS NOTE  Patient Name: [name] MRN: [id number] DOB:12-12-1945, 78 y.o., male Today's Date: 10/16/2023  PCP: Watson Hacking, MD REFERRING PROVIDER: Shirline Dover, DO  END OF SESSION:  OT End of Session - 10/16/23 1115     Visit Number 11    Number of Visits 16    Date for OT Re-Evaluation 11/01/23    Authorization Type [employer] MCR - Auth required    Progress Note Due on Visit 20    OT Start Time 1113   pt arrived late   OT Stop Time 1145    OT Time Calculation (min) 32 min    Activity Tolerance Patient tolerated treatment well    Behavior During Therapy [facility] for tasks assessed/performed             Past Medical History:  Diagnosis Date   Acquired hallux rigidus, right 03/25/2023   Acquired trigger finger of left middle finger 04/22/2019   Acquired trigger finger of right middle finger 04/22/2019   ADHD, predominantly hyperactive type    Allergic rhinitis due to pollen 08/08/2010   Allergy  vaccine 1:50,000 03/25/08; 1:10 03/27/09 > 1:2 GH >> DC'd 01/04/2015 for observation        Anemia    takes Fe -    Anginal pain    Ankylosing spondylitis 10/09/2018   Arthritis    Atherosclerosis of aorta 08/30/2020   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 Anastasia Balo   Chronic asthma 05/17/2011   Chronic low back pain with sciatica 11/14/2021   Chronic pain 11/21/2016   COPD (chronic obstructive pulmonary disease)    Dizziness 12/24/2021   Early satiety 09/11/2021   Erectile dysfunction    Esophageal motility disorder 09/11/2021   Generalized anxiety disorder    GERD (gastroesophageal reflux disease)    History of benign positional vertigo    History of colonic polyps 09/11/2021   History of kidney stones    Hoarseness 09/11/2021   Hyperlipidemia    Hypertension    Hypogonadism male    prior use on Testim    Insomnia    Iron deficiency 09/11/2021   Left ventricular  dysfunction 10/12/2018   Leg pain, left 11/14/2021   Lumbar disc disease    Major depressive disorder    Mild concentric left ventricular hypertrophy (LVH)    Muscle spasm 11/14/2021   Old inferior myocardial infarction 1995   OSA (obstructive sleep apnea) 11/12/2022   NPSG 10/21/22- AHI 28.7/ hr, desat to 89%/Mean 93%, body weight 165 lbs     Pain in both feet 08/10/2021   Peripheral vascular disease 12/25/2021   Pneumonia    Presbycusis of both ears 04/23/2018   Shortness of breath dyspnea    Slow transit constipation 09/11/2021   Spinal cord stimulator status 11/14/2021   Spondylolisthesis of lumbar region 02/28/2015   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 Anastasia Balo   CHEILECTOMY  10/16/2011   Procedure: CHEILECTOMY;  Surgeon: Janifer Meigs, MD;  Location: [facility];  Service: Orthopedics;  Laterality: Right;   CHOLECYSTECTOMY     COLONOSCOPY     CORONARY ANGIOPLASTY     1995  after MI   CORONARY ANGIOPLASTY WITH STENT PLACEMENT     1999    ESOPHAGOGASTRODUODENOSCOPY  02/18/2012   Procedure: ESOPHAGOGASTRODUODENOSCOPY (EGD);  Surgeon: Brice Campi, MD;  Location: [facility];  Service: Endoscopy;  Laterality: N/A;   FOOT SURGERY     joint scraped, arthritis right foot   HERNIA REPAIR     LUMBAR DISC SURGERY     x2   right index finger  mass removed- arthritis     Dr. Aloha Arnold   [facility]  Right 05/18/2021   medial toe consistent with callus   SKIN BIOPSY Left 05/17/2021   actinic keratosis   SPINAL CORD STIMULATOR BATTERY EXCHANGE N/A 10/11/2021   Procedure: SPINAL CORD STIMULATOR BATTERY EXCHANGE;  Surgeon: Mort Ards, MD;  Location: [facility] OR;  Service: Orthopedics;  Laterality: N/A;  60 mins    SPINAL CORD STIMULATOR INSERTION N/A 11/21/2016   Procedure: LUMBAR SPINAL CORD STIMULATOR INSERTION;  Surgeon: Mort Ards, MD;  Location: [facility] OR;  Service: Orthopedics;  Laterality: N/A;  Requests 2.5 hrs   TONSILLECTOMY     1953   UPPER GI ENDOSCOPY  [id number]   VASECTOMY     1980   Patient Active Problem List   Diagnosis Date Noted   Attention or concentration deficit [R41.840] 05/30/2023   MCI (mild cognitive impairment) 05/30/2023   Generalized anxiety disorder    Acquired hallux rigidus, right 03/25/2023   OSA (obstructive sleep apnea) 11/12/2022   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   Peripheral vascular disease 12/25/2021   Dizziness 12/24/2021   Leg pain, left 11/14/2021   Chronic low back pain with sciatica 11/14/2021   Spinal cord stimulator status 11/14/2021   Muscle spasm 11/14/2021   Slow transit constipation 09/11/2021   Early satiety 09/11/2021   Hoarseness 09/11/2021   Iron deficiency 09/11/2021   History of colonic polyps 09/11/2021   Weight decreased 09/11/2021   Esophageal motility disorder 09/11/2021   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   Pain in both feet 08/10/2021   Atherosclerosis of aorta 08/30/2020   Mild concentric left ventricular hypertrophy (LVH)    Hypogonadism male    History of kidney stones    Erectile dysfunction    COPD (chronic obstructive pulmonary disease)    Acquired trigger finger of left middle finger 04/22/2019   Acquired trigger finger of right middle finger 04/22/2019   Left ventricular dysfunction 10/12/2018   Ankylosing spondylitis 10/09/2018   Presbycusis of both ears 04/23/2018   Chronic pain 11/21/2016   Arthritis 10/15/2016   Spondylolisthesis of lumbar region 02/28/2015   Hyperlipidemia    Lumbar disc disease    Hypertension    CAD (coronary artery disease) 05/19/2012   GERD (gastroesophageal reflux disease)    Major depressive disorder    Chronic asthma 05/17/2011   Allergic rhinitis due to pollen 08/08/2010   History of  benign positional vertigo    ADHD, predominantly hyperactive type     ONSET DATE: 08/19/2023 (referral date - based off screen recommendations)  REFERRING DIAG: Parkinson's Disease R41.3 (ICD-10-CM) - Memory impairment  THERAPY DIAG:  Other lack of coordination  Attention and concentration deficit  Unsteadiness on feet  Rationale for Evaluation and Treatment: Rehabilitation  SUBJECTIVE:   SUBJECTIVE STATEMENT: I got vertigo yesterday and immediately took meclizine  and it's getting better. I've been painting more  Pt accompanied by: self   PERTINENT HISTORY: PD (diagnosed Spring 2024), ADHD, CAD, COPD, HTN, HLD, Dizziness, MI  PRECAUTIONS: Fall and Other: no driving, spinal nerve stimulator  WEIGHT BEARING RESTRICTIONS: No  PAIN:  Are you having pain? No  FALLS: Has patient fallen in last 6 months? 81fall  LIVING ENVIRONMENT: Lives with: lives with their spouse Carles Cheadle") and 3 cats Lives in:1 story home, 3-4 steps to enter (ramp on back). 4 steps down to washer/dryer but wife does Has following equipment at home: Otho Blitz - 2 wheeled  PLOF: Independent with basic ADLs, retired  PATIENT GOALS: get better  OBJECTIVE:  Note: Objective measures were completed at Evaluation unless otherwise noted.  HAND DOMINANCE: Right  ADLs: Overall ADLs: slower Transfers/ambulation related to ADLs: no device in house Eating: independent Grooming: independent UB Dressing: slower with jacket, difficulty and occasional assist with buttons LB Dressing: slower, assist for suspenders Toileting: independent Bathing: mod I - pt has grab bars Tub Shower transfers: walk in shower, but currently using tub/shower combo Equipment: Grab bars  IADLs: Shopping: pt accompanies wife occasionally Light housekeeping: pt washes dishes Meal Prep: pt cooks some [employer] mobility: relies on wife for transportation. Has not driven in a year since diagnosis Medication management: pt/wife  loads pillbox together, pt sometimes forgets to take meds Financial management: wife does Handwriting: 100% legible and Moderate micrographia  MOBILITY STATUS: uses walker outside of home, mostly independent in home  POSTURE COMMENTS:  rounded shoulders and forward head   FUNCTIONAL OUTCOME MEASURES: (from screen on 08/19/23)  Fastening/unfastening 3 buttons: 50 sec Physical performance test: PPT#2 (simulated eating) 14 sec & PPT#4 (donning/doffing jacket): 22 sec  COORDINATION: (from screen on 08/19/23)  9 Hole Peg test: Right: 39 sec; Left: 40 sec Box and Blocks:  Right 37 blocks, Left 45 blocks Tremors: Resting and mild  UE ROM:  WFL   SENSATION: WFL  MUSCLE TONE: not tested  COGNITION: Overall cognitive status: Impaired and decreased memory. Pt also has ADHD (premorbid) and reports he's easily distracted.   OBSERVATIONS: Bradykinesia and Hypokinesia, stooped posture, shuffling feet, arthritis bilateral hands w/ MP ulnar drift                                                                                                                    TREATMENT :   Reviewed safety w/ ambulation using walker (staying inside walker and picking up feet) and with safety to sit in chair Practiced buttoning/unbuttoning shirt (while on) with cues/strategies Practiced writing name in print on regular paper with cues to write big, slow down, and pause between each letter - pt able to maintain 100% legibility and only mild micrographia near end.  Pt then practiced writing name in cursive w/ 100% legibility maintaining size with direct cueing Pt/therapist played "trash dice" for coordination, visual scanning, and light cognitive challenge: pt did well with min cueing throughout task Pt then manipulating dii in hand to place right side up for each corresponding number Rt hand then Lt hand  PATIENT EDUCATION: Education details: see above Person educated: Patient Education method: Explanation and  Verbal cues Education comprehension: verbalized understanding and verbal cues required  HOME EXERCISE PROGRAM: 09/10/23: [place]! Hands, MP  finger walking ex, coordination HEP 09/16/23: ADL strategies, handwriting strategies 09/30/23: bag ex's, memory strategies and ways to keep thinking skills sharp 10/09/23: ways to prevent future PD complications and community resources  GOALS: Goals reviewed with patient? Yes  SHORT TERM GOALS: Target date: 10/02/23  Independent with PD specific HEP (including finger walking ex to counteract MP ulnar drift)    Baseline: Goal status: MET  2.  Pt will verbalize understanding of ways to keep thinking skills sharp and ways to compensate for STM changes for medication management Baseline:  Goal status: MET  3.  Pt will write a sentence with no significant decrease in size and maintain 100% legibility  Baseline:  Goal status: MET -  using graph paper and writing in print for short sentences   4.  Pt will demonstrate increased ease with dressing as evidenced by decreasing PPT#4 (don/ doff jacket) to 18 secs or less  Baseline: 22 sec Goal status: MET (10/09/23 = 17.46 sec)   5.  Pt will verbalize understanding of adaptive strategies to increase ease with ADLS/IADLS   Baseline:  Goal status: MET   6.  Pt to improve RUE as evidenced by increasing Box & Blocks by 4 or more Baseline: 37 10/07/23: 39 Blocks 10/14/2023: 45 blocks Goal status: MET   LONG TERM GOALS: Target date: 11/01/23  Pt will verbalize understanding of ways to prevent future PD related complications and appropriate community resources prn  Baseline:  Goal status: MET  2.  Pt will demonstrate improved ease with fastening buttons as evidenced by decreasing 3 button/unbutton time by 8 seconds or more Baseline: 50 sec 10/14/2023: pt attempted on button up polo style shirt he was wearing and at table level but had difficulty as he got distracted.  Goal status: INITIAL  3.  Pt will demonstrate  improved fine motor coordination for ADLs as evidenced by decreasing 9 hole peg test score for each hand and by 5 secs  Baseline: Rt = 39 sec, Lt = 40 sec.  10/14/2023: Rt = 39 sec, Lt = 54 sec.  Goal status: INITIAL  4.  Pt will write a paragraph with no significant decrease in size and maintain 100% legibility  Baseline:  Goal status: IN PROGRESS  5.  Pt will consistently don suspenders I'ly Baseline: needs assist Goal status: DEFERRED (recommended belt or elastic style paints)   ASSESSMENT:  CLINICAL IMPRESSION:  Pt has met 6/6 STGs and 1/4 LTGs. Pt making progress towards goals as expected and continues to benefit from skilled OT services in the outpatient setting to work towards remaining goals or until max rehab potential is met.   PERFORMANCE DEFICITS: in functional skills including ADLs, IADLs, coordination, dexterity, proprioception, strength, Fine motor control, Gross motor control, mobility, balance, body mechanics, decreased knowledge of precautions, decreased knowledge of use of DME, vision, and UE functional use, cognitive skills including attention, memory, problem solving, and safety awareness, and psychosocial skills including coping strategies.   IMPAIRMENTS: are limiting patient from ADLs, IADLs, leisure, and social participation.   COMORBIDITIES:  may have co-morbidities  that affects occupational performance. Patient will benefit from skilled OT to address above impairments and improve overall function.  REHAB POTENTIAL: Good  PLAN:  OT FREQUENCY: 2x/week  OT DURATION: 8 weeks  PLANNED INTERVENTIONS: 97535 self care/ADL training, 13086 therapeutic exercise, 97530 therapeutic activity, 97112 neuromuscular re-education, 97140 manual therapy, 97018 paraffin, 57846 fluidotherapy, 97010 moist heat, 97034 contrast bath, 97760 Orthotics management and training, 96295 Splinting (initial encounter), [id number] Subsequent  splinting/medication, passive range of motion, functional  mobility training, visual/perceptual remediation/compensation, energy conservation, coping strategies training, patient/family education, and DME and/or AE instructions  RECOMMENDED OTHER SERVICES: none at this time  CONSULTED AND AGREED WITH PLAN OF CARE: Patient and family member/caregiver  PLAN FOR NEXT SESSION: practice jacket, play simplified golf solitaire and "shut the box" dice game, progress towards remaining LTG's - anticipate d/c next week.    Velinda Getting, OT 10/16/2023, 11:16 AM

## 2023-10-21 ENCOUNTER — Ambulatory Visit: Admitting: Physical Therapy

## 2023-10-21 ENCOUNTER — Ambulatory Visit: Payer: Self-pay

## 2023-10-21 ENCOUNTER — Ambulatory Visit: Admitting: Occupational Therapy

## 2023-10-21 ENCOUNTER — Encounter: Admitting: Speech Pathology

## 2023-10-21 DIAGNOSIS — R41840 Attention and concentration deficit: Secondary | ICD-10-CM

## 2023-10-21 DIAGNOSIS — R471 Dysarthria and anarthria: Secondary | ICD-10-CM

## 2023-10-21 DIAGNOSIS — M6281 Muscle weakness (generalized): Secondary | ICD-10-CM | POA: Diagnosis not present

## 2023-10-21 DIAGNOSIS — R278 Other lack of coordination: Secondary | ICD-10-CM

## 2023-10-21 DIAGNOSIS — R41841 Cognitive communication deficit: Secondary | ICD-10-CM

## 2023-10-21 NOTE — Therapy (Signed)
 OUTPATIENT OCCUPATIONAL THERAPY PARKINSON'S TREATMENT   Patient Name: [name] MRN: [id number] DOB:03/11/1946, 78 y.o., male Today's Date: 10/21/2023  PCP: Watson Hacking, MD REFERRING PROVIDER: Shirline Dover, DO  END OF SESSION:  OT End of Session - 10/21/23 1203     Visit Number 12    Number of Visits 16    Date for OT Re-Evaluation 11/01/23    Authorization Type [employer] MCR - Auth required    Progress Note Due on Visit 20    OT Start Time 1148    OT Stop Time 1228    OT Time Calculation (min) 40 min    Activity Tolerance Patient tolerated treatment well    Behavior During Therapy [facility] for tasks assessed/performed             Past Medical History:  Diagnosis Date   Acquired hallux rigidus, right 03/25/2023   Acquired trigger finger of left middle finger 04/22/2019   Acquired trigger finger of right middle finger 04/22/2019   ADHD, predominantly hyperactive type    Allergic rhinitis due to pollen 08/08/2010   Allergy  vaccine 1:50,000 03/25/08; 1:10 03/27/09 > 1:2 GH >> DC'd 01/04/2015 for observation        Anemia    takes Fe -    Anginal pain    Ankylosing spondylitis 10/09/2018   Arthritis    Atherosclerosis of aorta 08/30/2020   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 Anastasia Balo   Chronic asthma 05/17/2011   Chronic low back pain with sciatica 11/14/2021   Chronic pain 11/21/2016   COPD (chronic obstructive pulmonary disease)    Dizziness 12/24/2021   Early satiety 09/11/2021   Erectile dysfunction    Esophageal motility disorder 09/11/2021   Generalized anxiety disorder    GERD (gastroesophageal reflux disease)    History of benign positional vertigo    History of colonic polyps 09/11/2021   History of kidney stones    Hoarseness 09/11/2021   Hyperlipidemia    Hypertension    Hypogonadism male    prior use on Testim    Insomnia    Iron deficiency 09/11/2021   Left ventricular dysfunction 10/12/2018   Leg pain, left  11/14/2021   Lumbar disc disease    Major depressive disorder    Mild concentric left ventricular hypertrophy (LVH)    Muscle spasm 11/14/2021   Old inferior myocardial infarction 1995   OSA (obstructive sleep apnea) 11/12/2022   NPSG 10/21/22- AHI 28.7/ hr, desat to 89%/Mean 93%, body weight 165 lbs     Pain in both feet 08/10/2021   Peripheral vascular disease 12/25/2021   Pneumonia    Presbycusis of both ears 04/23/2018   Shortness of breath dyspnea    Slow transit constipation 09/11/2021   Spinal cord stimulator status 11/14/2021   Spondylolisthesis of lumbar region 02/28/2015   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 Anastasia Balo   CHEILECTOMY  10/16/2011   Procedure: CHEILECTOMY;  Surgeon: Janifer Meigs, MD;  Location: [facility];  Service: Orthopedics;  Laterality: Right;   CHOLECYSTECTOMY     COLONOSCOPY     CORONARY ANGIOPLASTY     1995 after MI   CORONARY ANGIOPLASTY  WITH STENT PLACEMENT     1999    ESOPHAGOGASTRODUODENOSCOPY  02/18/2012   Procedure: ESOPHAGOGASTRODUODENOSCOPY (EGD);  Surgeon: Brice Campi, MD;  Location: [facility];  Service: Endoscopy;  Laterality: N/A;   FOOT SURGERY     joint scraped, arthritis right foot   HERNIA REPAIR     LUMBAR DISC SURGERY     x2   right index finger  mass removed- arthritis     Dr. Aloha Arnold   [facility]  Right 05/18/2021   medial toe consistent with callus   SKIN BIOPSY Left 05/17/2021   actinic keratosis   SPINAL CORD STIMULATOR BATTERY EXCHANGE N/A 10/11/2021   Procedure: SPINAL CORD STIMULATOR BATTERY EXCHANGE;  Surgeon: Mort Ards, MD;  Location: [facility] OR;  Service: Orthopedics;  Laterality: N/A;  60 mins   SPINAL CORD STIMULATOR INSERTION N/A  11/21/2016   Procedure: LUMBAR SPINAL CORD STIMULATOR INSERTION;  Surgeon: Mort Ards, MD;  Location: [facility];  Service: Orthopedics;  Laterality: N/A;  Requests 2.5 hrs   TONSILLECTOMY     1953   UPPER GI ENDOSCOPY  [id number]   VASECTOMY     1980   Patient Active Problem List   Diagnosis Date Noted   Attention or concentration deficit [R41.840] 05/30/2023   MCI (mild cognitive impairment) 05/30/2023   Generalized anxiety disorder    Acquired hallux rigidus, right 03/25/2023   OSA (obstructive sleep apnea) 11/12/2022   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   Peripheral vascular disease 12/25/2021   Dizziness 12/24/2021   Leg pain, left 11/14/2021   Chronic low back pain with sciatica 11/14/2021   Spinal cord stimulator status 11/14/2021   Muscle spasm 11/14/2021   Slow transit constipation 09/11/2021   Early satiety 09/11/2021   Hoarseness 09/11/2021   Iron deficiency 09/11/2021   History of colonic polyps 09/11/2021   Weight decreased 09/11/2021   Esophageal motility disorder 09/11/2021   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   Pain in both feet 08/10/2021   Atherosclerosis of aorta 08/30/2020   Mild concentric left ventricular hypertrophy (LVH)    Hypogonadism male    History of kidney stones    Erectile dysfunction    COPD (chronic obstructive pulmonary disease)    Acquired trigger finger of left middle finger 04/22/2019   Acquired trigger finger of right middle finger 04/22/2019   Left ventricular dysfunction 10/12/2018   Ankylosing spondylitis 10/09/2018   Presbycusis of both ears 04/23/2018   Chronic pain 11/21/2016   Arthritis 10/15/2016   Spondylolisthesis of lumbar region 02/28/2015   Hyperlipidemia    Lumbar disc disease    Hypertension    CAD (coronary artery disease) 05/19/2012   GERD (gastroesophageal reflux disease)    Major depressive disorder    Chronic asthma 05/17/2011   Allergic rhinitis due to pollen 08/08/2010   History of benign positional vertigo    ADHD,  predominantly hyperactive type     ONSET DATE: 08/19/2023 (referral date - based off screen recommendations)  REFERRING DIAG: Parkinson's Disease R41.3 (ICD-10-CM) - Memory impairment  THERAPY DIAG:  Other lack of coordination  Attention and concentration deficit  Muscle weakness (generalized)  Rationale for Evaluation and Treatment: Rehabilitation  SUBJECTIVE:   SUBJECTIVE STATEMENT: He has a similar logic puzzle at home.    Pt accompanied by: self   PERTINENT HISTORY: PD (diagnosed Spring 2024), ADHD, CAD, COPD, HTN, HLD, Dizziness, MI  PRECAUTIONS: Fall and Other: no driving, spinal nerve stimulator  WEIGHT BEARING RESTRICTIONS: No  PAIN:  Are you having pain? No  FALLS: Has patient  fallen in last 6 months? 6fall  LIVING ENVIRONMENT: Lives with: lives with their spouse Carles Cheadle") and 3 cats Lives in:1 story home, 3-4 steps to enter (ramp on back). 4 steps down to washer/dryer but wife does Has following equipment at home: Otho Blitz - 2 wheeled  PLOF: Independent with basic ADLs, retired  PATIENT GOALS: get better  OBJECTIVE:  Note: Objective measures were completed at Evaluation unless otherwise noted.  HAND DOMINANCE: Right  ADLs: Overall ADLs: slower Transfers/ambulation related to ADLs: no device in house Eating: independent Grooming: independent UB Dressing: slower with jacket, difficulty and occasional assist with buttons LB Dressing: slower, assist for suspenders Toileting: independent Bathing: mod I - pt has grab bars Tub Shower transfers: walk in shower, but currently using tub/shower combo Equipment: Grab bars  IADLs: Shopping: pt accompanies wife occasionally Light housekeeping: pt washes dishes Meal Prep: pt cooks some [employer] mobility: relies on wife for transportation. Has not driven in a year since diagnosis Medication management: pt/wife loads pillbox together, pt sometimes forgets to take meds Financial management: wife  does Handwriting: 100% legible and Moderate micrographia  MOBILITY STATUS: uses walker outside of home, mostly independent in home  POSTURE COMMENTS:  rounded shoulders and forward head   FUNCTIONAL OUTCOME MEASURES: (from screen on 08/19/23)  Fastening/unfastening 3 buttons: 50 sec Physical performance test: PPT#2 (simulated eating) 14 sec & PPT#4 (donning/doffing jacket): 22 sec  COORDINATION: (from screen on 08/19/23)  9 Hole Peg test: Right: 39 sec; Left: 40 sec Box and Blocks:  Right 37 blocks, Left 45 blocks Tremors: Resting and mild  UE ROM:  WFL   SENSATION: WFL  MUSCLE TONE: not tested  COGNITION: Overall cognitive status: Impaired and decreased memory. Pt also has ADHD (premorbid) and reports he's easily distracted.   OBSERVATIONS: Bradykinesia and Hypokinesia, stooped posture, shuffling feet, arthritis bilateral hands w/ MP ulnar drift                                                                                                                    TREATMENT :   Pt played shut the box using BUE to roll dice, pick up dice, and flip over game pieces while working on  fine motor coordination, gross motor coordination, scanning and locating of items, processing, bimanual coordination/trunk control, sequencing of unfamiliar motor movements or tasks, and item/pattern recognition.    Pt played Pixy Cubes for fine motor coordination, gross motor coordination, scanning and locating of items, processing, bimanual coordination/trunk control, sequencing of unfamiliar motor movements or tasks, and item/pattern recognition  PATIENT EDUCATION: Education details: see above Person educated: Patient Education method: Explanation and Verbal cues Education comprehension: verbalized understanding and verbal cues required  HOME EXERCISE PROGRAM: 09/10/23: PWR! Hands, MP finger walking ex, coordination HEP 09/16/23: ADL strategies, handwriting strategies 09/30/23: bag ex's, memory  strategies and ways to keep thinking skills sharp 10/09/23: ways to prevent future PD complications and community resources  GOALS: Goals reviewed with patient? Yes  SHORT TERM GOALS: Target date: 10/02/23  Independent with PD specific HEP (including finger walking ex to counteract MP ulnar drift)    Baseline: Goal status: MET  2.  Pt will verbalize understanding of ways to keep thinking skills sharp and ways to compensate for STM changes for medication management Baseline:  Goal status: MET  3.  Pt will write a sentence with no significant decrease in size and maintain 100% legibility  Baseline:  Goal status: MET -  using graph paper and writing in print for short sentences   4.  Pt will demonstrate increased ease with dressing as evidenced by decreasing PPT#4 (don/ doff jacket) to 18 secs or less  Baseline: 22 sec Goal status: MET (10/09/23 = 17.46 sec)   5.  Pt will verbalize understanding of adaptive strategies to increase ease with ADLS/IADLS   Baseline:  Goal status: MET   6.  Pt to improve RUE as evidenced by increasing Box & Blocks by 4 or more Baseline: 37 10/07/23: 39 Blocks 10/14/2023: 45 blocks Goal status: MET   LONG TERM GOALS: Target date: 11/01/23  Pt will verbalize understanding of ways to prevent future PD related complications and appropriate community resources prn  Baseline:  Goal status: MET  2.  Pt will demonstrate improved ease with fastening buttons as evidenced by decreasing 3 button/unbutton time by 8 seconds or more Baseline: 50 sec 10/14/2023: pt attempted on button up polo style shirt he was wearing and at table level but had difficulty as he got distracted.  Goal status: INITIAL  3.  Pt will demonstrate improved fine motor coordination for ADLs as evidenced by decreasing 9 hole peg test score for each hand and by 5 secs  Baseline: Rt = 39 sec, Lt = 40 sec.  10/14/2023: Rt = 39 sec, Lt = 54 sec.  Goal status: INITIAL  4.  Pt will write a paragraph  with no significant decrease in size and maintain 100% legibility  Baseline:  Goal status: IN PROGRESS  5.  Pt will consistently don suspenders I'ly Baseline: needs assist Goal status: DEFERRED (recommended belt or elastic style paints)   ASSESSMENT:  CLINICAL IMPRESSION:  Pt demonstrates good understanding of logic puzzles today though required increased time and mod cueing for proper play. He would benefit from activity he can carryover into home that addresses multiple limitations.    PERFORMANCE DEFICITS: in functional skills including ADLs, IADLs, coordination, dexterity, proprioception, strength, Fine motor control, Gross motor control, mobility, balance, body mechanics, decreased knowledge of precautions, decreased knowledge of use of DME, vision, and UE functional use, cognitive skills including attention, memory, problem solving, and safety awareness, and psychosocial skills including coping strategies.   IMPAIRMENTS: are limiting patient from ADLs, IADLs, leisure, and social participation.   COMORBIDITIES:  may have co-morbidities  that affects occupational performance. Patient will benefit from skilled OT to address above impairments and improve overall function.  REHAB POTENTIAL: Good  PLAN:  OT FREQUENCY: 2x/week  OT DURATION: 8 weeks  PLANNED INTERVENTIONS: 97535 self care/ADL training, 16109 therapeutic exercise, 97530 therapeutic activity, 97112 neuromuscular re-education, 97140 manual therapy, 97018 paraffin, 60454 fluidotherapy, 97010 moist heat, 97034 contrast bath, 97760 Orthotics management and training, 09811 Splinting (initial encounter), [id number] Subsequent splinting/medication, passive range of motion, functional mobility training, visual/perceptual remediation/compensation, energy conservation, coping strategies training, patient/family education, and DME and/or AE instructions  RECOMMENDED OTHER SERVICES: none at this time  CONSULTED AND AGREED WITH PLAN OF  CARE: Patient and family member/caregiver  PLAN FOR NEXT SESSION: practice jacket, play simplified golf  solitaire, progress towards remaining LTG's - anticipate d/c next week.   Did he find his logic puzzle?  Altamease Asters, OT 10/21/2023, 2:27 PM

## 2023-10-21 NOTE — Therapy (Signed)
 OUTPATIENT SPEECH LANGUAGE PATHOLOGY PARKINSON'S TREATMENT (DISCHARGE)  Patient Name: [name] MRN: [id number] DOB:1946/06/10, 78 y.o., male Today's Date: 10/21/2023  PCP: Watson Hacking, MD REFERRING PROVIDER: Shirline Dover, DO  END OF SESSION:  End of Session - 10/21/23 1134     Visit Number 11    Number of Visits 17    Date for SLP Re-Evaluation 10/27/23    Authorization Type [employer] medicare    Progress Note Due on Visit 10    SLP Start Time 1240   arrived late from restroom   SLP Stop Time  1312    SLP Time Calculation (min) 32 min    Activity Tolerance Patient tolerated treatment well             SPEECH THERAPY DISCHARGE SUMMARY  Visits from Start of Care: 11  Current functional level related to goals / functional outcomes: Presents with ability to improve vocal quality and intensity with use of Speak Out! Program and principle of intent. Benefits from consistent assistance to complete targeted exercises and usual cues to maintain intent in conversation. Cues required to monitor attention in conversation impacting message cohesion.    Remaining deficits: PD   Education / Equipment: Speak Out! Program and principles, cognitive strategies, caregiver education    Patient agrees to discharge. Patient goals were partially met. Patient is being discharged due to being pleased with the current functional level.      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 Anastasia Balo   Chronic asthma 05/17/2011   Chronic low back pain with sciatica 11/14/2021   Chronic pain 11/21/2016   COPD (chronic obstructive pulmonary disease)    Dizziness 12/24/2021   Early satiety 09/11/2021   Erectile dysfunction    Esophageal motility disorder 09/11/2021   Generalized anxiety disorder    GERD (gastroesophageal reflux disease)    History of benign positional vertigo    History of colonic polyps 09/11/2021   History of kidney stones    Hoarseness 09/11/2021   Hyperlipidemia    Hypertension    Hypogonadism male    prior use on Testim    Insomnia    Iron deficiency 09/11/2021   Left ventricular dysfunction 10/12/2018   Leg pain, left 11/14/2021   Lumbar disc disease    Major depressive disorder    Mild concentric left ventricular hypertrophy (LVH)    Muscle spasm 11/14/2021   Old inferior myocardial infarction 1995   OSA (obstructive sleep apnea) 11/12/2022   NPSG 10/21/22- AHI 28.7/ hr, desat to 89%/Mean 93%, body weight 165 lbs     Pain in both feet 08/10/2021   Peripheral vascular disease 12/25/2021   Pneumonia    Presbycusis of both ears 04/23/2018   Shortness of breath dyspnea    Slow transit constipation 09/11/2021   Spinal cord stimulator status 11/14/2021   Spondylolisthesis of lumbar region 02/28/2015  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 Anastasia Balo   CHEILECTOMY  10/16/2011   Procedure: CHEILECTOMY;  Surgeon: Janifer Meigs, MD;  Location: [facility];  Service: Orthopedics;  Laterality: Right;   CHOLECYSTECTOMY     COLONOSCOPY     CORONARY ANGIOPLASTY     1995 after MI   CORONARY ANGIOPLASTY WITH STENT PLACEMENT     1999     ESOPHAGOGASTRODUODENOSCOPY  02/18/2012   Procedure: ESOPHAGOGASTRODUODENOSCOPY (EGD);  Surgeon: Brice Campi, MD;  Location: [facility];  Service: Endoscopy;  Laterality: N/A;   FOOT SURGERY     joint scraped, arthritis right foot   HERNIA REPAIR     LUMBAR DISC SURGERY     x2   right index finger  mass removed- arthritis     Dr. Aloha Arnold   [facility]  Right 05/18/2021   medial toe consistent with callus   SKIN BIOPSY Left 05/17/2021   actinic keratosis   SPINAL CORD STIMULATOR BATTERY EXCHANGE N/A 10/11/2021   Procedure: SPINAL CORD STIMULATOR BATTERY EXCHANGE;  Surgeon: Mort Ards, MD;  Location: [facility] OR;  Service: Orthopedics;  Laterality: N/A;  60 mins   SPINAL CORD STIMULATOR INSERTION N/A 11/21/2016   Procedure: LUMBAR SPINAL CORD STIMULATOR INSERTION;  Surgeon: Mort Ards, MD;  Location: [facility] OR;  Service: Orthopedics;  Laterality: N/A;  Requests 2.5 hrs   TONSILLECTOMY     1953   UPPER GI ENDOSCOPY  [id number]   VASECTOMY     1980   Patient Active Problem List   Diagnosis Date Noted   Attention or concentration deficit [R41.840] 05/30/2023   MCI (mild cognitive impairment) 05/30/2023   Generalized anxiety disorder    Acquired hallux rigidus, right 03/25/2023   OSA (obstructive sleep apnea) 11/12/2022   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   Peripheral vascular disease 12/25/2021   Dizziness 12/24/2021   Leg pain, left 11/14/2021   Chronic low back pain with sciatica 11/14/2021   Spinal cord stimulator status 11/14/2021   Muscle spasm 11/14/2021   Slow transit constipation 09/11/2021   Early satiety 09/11/2021   Hoarseness 09/11/2021   Iron deficiency 09/11/2021   History of colonic polyps 09/11/2021   Weight decreased 09/11/2021   Esophageal motility disorder 09/11/2021   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   Pain in both feet 08/10/2021   Atherosclerosis of aorta 08/30/2020   Mild concentric left ventricular hypertrophy (LVH)    Hypogonadism male    History of kidney stones     Erectile dysfunction    COPD (chronic obstructive pulmonary disease)    Acquired trigger finger of left middle finger 04/22/2019   Acquired trigger finger of right middle finger 04/22/2019   Left ventricular dysfunction 10/12/2018   Ankylosing spondylitis 10/09/2018   Presbycusis of both ears 04/23/2018   Chronic pain 11/21/2016   Arthritis 10/15/2016   Spondylolisthesis of lumbar region 02/28/2015   Hyperlipidemia    Lumbar disc disease    Hypertension    CAD (coronary artery disease) 05/19/2012   GERD (gastroesophageal reflux disease)    Major depressive disorder    Chronic asthma 05/17/2011   Allergic rhinitis due to pollen 08/08/2010   History of benign positional vertigo    ADHD, predominantly hyperactive type  ONSET DATE: 08/19/2023 - referral date  REFERRING DIAG: R49.0 (ICD-10-CM) - Dysphonia  THERAPY DIAG:  Dysarthria and anarthria  Cognitive communication deficit  Rationale for Evaluation and Treatment: Rehabilitation  SUBJECTIVE:   SUBJECTIVE STATEMENT: "I'm mentally tired" Pt accompanied by: significant other [name]  PERTINENT HISTORY: Dr. Winferd Hatter has previously theorized a vascular parkinsonian presentation being more likely than idiopathic Parkinson's disease. [name] is known to us  from prior course of ST 11/29/22-02/04/23.   Neuropsych eval Nov 2024: Mr. Beets' pattern of performance is suggestive of impairment surrounding executive functioning. Performance variability was further exhibited across processing speed, complex attention, semantic fluency, and both encoding (i.e., learning) and delayed retrieval aspects of memory.         MCI             -suspect due to meds             -Neurocognitive testing with Dr. Kitty Perkins confirmed MCI in November, 2024.  He mentioned medications as a role in diminishing cognition.  PAIN:  Are you having pain? No  PATIENT REPORTED OUTCOME MEASURES (PROM): - Total 18 The Communicative Participation Item Bank        Does  your condition interfere with... Pt Rating   ...talking with people you know 2   ...communicating when you need to say something quickly 3   ...talking with people you do not know 2   ...communicating when you are out in your community 1   ...asking questions in a conversation 2   ....communicating in a small group of people 2   ...having a long conversation 2   ...giving detailed infomrmation 3   ...getting your turn in a fast moving conversation 1   ...trying to persuade a friend or family member to see a different point of view 2  3= Not at all; 2=A little; 1=Quite a bit; 0=Very much                                                                                                                             TREATMENT DATE:  10/21/23: Reported cognitive fatigue today. Usual cues required to maintain intent in conversation today, with pt able to achieve improved clarity and intensity with cues. Educated patient on recommendations s/p ST discharge, including daily practice of Speak Out! Program (workbook and/or online) and use of intentional speech and swallowing. Provided additional cognitive compensations and recommendations (sticky notes on computer, password log, writing steps for troubleshooting computer). Pt verbalized understanding and agreement.   10/14/23: Arrived alone today. Consistent fading to intermittent cues required to maintain intent speaking in conversation. Volume average improved with topic of interest. Affirmed positive maintenance of volume with specific topics. Re-educated memory and attention strategies today, given pt demonstrating reduced recall due to internal and external distractions during discourse. Handout provided to aid recall and carryover.   10/07/23: Entered with hoarse voice without use of intentional speech. Re-educated importance of intentional speech to optimize vocal  intensity and clarity as pt noted with persistently reduced volume and intermittent  whispering. Utilized beginning exercises of Speak Out! Program to increase volume and clarity. Usual mod/max A required to aid accuracy and effort. Benefited from cues to stop and process task prior to initiating for improved performance. Briefly discussed safety awareness per discussion of patient demonstrating poor safety judgement at home. Demonstrated and instructed verbal framework to aid patient's problem solving and awareness to facilitate safer actions. Pt and wife verbalized understanding. Added 2 additional ST visits to maximize patient's understanding and carryover of targeted techniques.   10/03/23: Pt reports "a little" completion of Speak Out! exercises on a daily basis, SLP reminding pt of value and need for consistent practice. No hoarseness noted this date. SLP initiated Speak Out! Lesson #7 this date with usual model and consistent mod-A to maximize pt success maintaining increased vocal intensity and clear speech throughout each exercise. Pt benefited from mod/max-A and consistent model to to maintain improved vocal quality in conversation.  Averages this date: Sustained ah: 85 dB  Reading: 78 dB  Cognitive Exercise: 75 dB  Side conversation: 69 dB with intermittent cues  Pt demonstrates difficulty recalling names of his cats in conversation and reports ongoing challenges with memory. SLP initiated instruction of memory strategies to aid pt concerns, hand out provided to facilitate carryover. Planning to continue focus on memory strategies and initiate development of plan/schedule next session to support HEP completion.   09/30/23: Enters with moderate hoarseness today. Wife reported persistent hoarseness over weekend despite reported completion of HEP. SLP inquired about seasonal allergies and potential straining during Speak Out! Program. Pt denied both. Instructed Lesson #5 this date. Usual model and consistent mod A provided to utilize intentional volume and projection to reduce  hoarseness. In subsequent conversation, frequent mod/max A required to maintain intent and forward projection. Limited awareness of volume decay and hoarseness exhibited. Vocal quality did improve with SLP cues and further instruction of targeted techniques.   09/23/23: Pt shared increased completion of HEP, practicing "more days" as opposed to every other day. SLP reinforced importance of daily practice and lead pt through Speak Out lesson #3 this date. Pt benefiting from occasional min-A and rare model to complete exercises with increased intensity and improved vocal quality exhibited. Averages this date: Sustained ah: 85 dB  Counting: 83 dB  Reading: 84 dB  Cognitive Exercise: 78 dB  Side conversation: 71 dB with intermittent cues  Pt expresses increased difficulty "finding my words" this session. SLP initiated pt education of descriptive strategies to aid word retrieval. SLP provided additional education on memory strategy of utilizing visual aids d/t reported concerns with recalling music notes. Pt endorses understanding.  09/19/23: Addressed dysarthria via Speak Out lesson #2. Pt required usual mod to max-A and modeling for accurate completion of therapy exercises. Pt averages the following:   Sustained ah: 84  Counting: 82  Reading: 79  Cognitive Exercise: 75 with usual mod verbal cues and gesture cues Minimal carryover for side comments or conversational speech though pt is able to repeat prior utterances with verbal cues for use of intent. Reviewed cough alternatives, as pt demonstrating coughing this date, endorsing strong cough sensation. Minimal effectiveness in utilizing alternatives this date. Will continue to address.   09/16/23: Exhibited initial throat clear with awareness of need to reduce habit. Pt able to carryover throat clear alternatives with rare min x4 additional opportunities this date. Recently received new Speak Out! Workbook. Has watched some online lessons with inconsistent  completion. Re-educated recommendations for daily practice, in which pt verbalized understanding. Indicated feeling self-conscious completing with wife and Librarian, academic. Assisted with re-framing assistance to aid confidence and carryover versus judgement. Re-educated Lesson #1. Usual model and mod A provided to complete targeted tasks and maintain intent to achieve dB levels.  Pt averages the following volume levels:  Sustained AH: 86   Counting:84  Reading: 82  Cognitive Exercise: 79 with usual mod verbal cues and gesture cues Required frequent mod gestures and verbal cues, modeling for carryover of intent /volume when conversation was interjected during and after Speak Out! Lesson  Of note, pt instructed to bring own water  bottle to next ST session to aid hydration.   09/10/23: Mitch completed online home practice session on [employer] website. He reports his wife signed up for [employer] as well.  Targeted volume and intelligibility using Speak Out! Lesson E [employer]. Pt required usual min verbal cues, modeling, for volume and breath support.  Pt averages the following volume levels:  Sustained AH: 85 - cues required to keep volume and pitch steady and consistent  Counting:85  Reading (phrases): 80  Cognitive Exercise: 76 with usual mod verbal cues and gesture cues Required frequent mod gesture and verbal cues, modeling for carryover of intent /volume when conversation was interjected into Speak Out! Lesson   09/08/23: They have not signed up for e Library on [employer] ([employer]) website - Instructed them to do this - submitted [name]'s email to [employer] for access to [employer]. They both watched the required webinar on [employer] website. Targeted volume and intelligibility using Speak Out! Lesson April Week 1 on [employer] e Libary. Pt required occasional min  verbal cues, modeling, for volume and breath support. Pt averages the following volume levels:  Sustained AH:  85  Counting:80dB with occasional min cues for smooth and connected counting Pitch glides required usual mod modeling, gesture cues to achieve pitch change and prolong glides  Reading (phrases): 75  Cognitive Exercise: 72dB Required usual lmod verbal cues, modeling for carryover of intent /volume answering simple questions following structured practice.  Completed CPIB PROM - See above    09/01/23 (eval day): Eval completed. Initiated training in compensatory strategies for slow processing - see Patient Instructions. Initiated Speak Out! Lesson one to review and instruct on HEP - With occasional min  A, Mitch achieved the dB targets for warm ups, sustained Ah and counting. He required usual mod A to maintain volume on glides and conversation exercises. Demonstrated the Parkinson Voice Project website and instructed [name] and Carles Cheadle to watch the What is Parkinsons and Living with Parkinsons webinar. Introduced the on line home practice sessions as well. In conversation, [name] responded well to frequent mod verbal cues and visual cues to carryover intentional speech to average 70dB. Provided a log to track completion of HEP   PATIENT EDUCATION: Education details: see above Person educated: Patient and Spouse Education method: Explanation, Demonstration, Verbal cues, and Handouts Education comprehension: verbalized understanding, returned demonstration, verbal cues required, and needs further education  HOME EXERCISE PROGRAM: Speak Out! Therapy Program to be completed twice daily    GOALS: Goals reviewed with patient? Yes  SHORT TERM GOALS: Target date: 09/29/23  Pt will complete HEP for dysarthria with occasional min A Baseline: Goal status: NOT MET  2.  Pt will average 72dB 18/20 sentences Baseline:  Goal status: MET  3.  Pt will complete HEP twice a week 5/7 days outside of  ST Baseline:  Goal status: PARTIALLY MET  4.  Pt will carryover 2 compensatory strategies to support  processing speed in conversation Baseline:  Goal status: NO MET  5.  Pt will average 72dB over 8 minute conversation with occasional min A Baseline:  Goal status: PARTIALLY MET   LONG TERM GOALS: Target date: 10/27/23  Pt will complete HEP for dysarthria with mod I Baseline:  Goal status: NOT MET  2.  Pt will ID and correct hoarse low volume with supervision cues Baseline:  Goal status: MET  3.  Pt will average 72dB over 15 minute conversation Baseline:  Goal status: MET  4.  Pt will participate in 2 on line home practice sessions on the Parkinson Voice [employer] website Baseline:  Goal status: NOT MET  5.  Pt will verbalize a plan/schedule to continue daily Speak Out! Practice after d/c from ST Baseline:  Goal status: PARTIALLY MET  6.  Pt will improve score on Communication Participation Item Bank Baseline: 18 Goal status: DEFERRED  ASSESSMENT:  CLINICAL IMPRESSION: Patient is a 78 y.o. 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 Completed pt education and instruction of Speak Out lessons and principles. Pt is pleased with current level of functioning and agreeable to ST discharge on last scheduled visit.   OBJECTIVE IMPAIRMENTS: Objective impairments include attention, memory, executive functioning, expressive language, and dysarthria. These impairments are limiting patient from effectively communicating at home and in community.Factors affecting potential to achieve goals and functional outcome are medical prognosis.. Patient will benefit from skilled SLP services to address above impairments and improve overall function.  REHAB POTENTIAL: Good  PLAN:  SLP FREQUENCY: 2x/week  SLP DURATION: 8 weeks  PLANNED INTERVENTIONS: Aspiration precaution training, Diet toleration management , Language facilitation, Environmental controls, Cueing hierachy, Cognitive reorganization, Internal/external aids,  Functional tasks, Multimodal communication approach, SLP instruction and feedback, Compensatory strategies, Patient/family education, and [id number] Treatment of speech (30 or 45 min)     Tamar Fairly, CCC-SLP 10/21/2023, 1:13 PM

## 2023-10-23 ENCOUNTER — Ambulatory Visit: Admitting: Occupational Therapy

## 2023-11-12 ENCOUNTER — Encounter: Payer: Self-pay | Admitting: Family Medicine

## 2023-11-12 ENCOUNTER — Ambulatory Visit (INDEPENDENT_AMBULATORY_CARE_PROVIDER_SITE_OTHER): Admitting: Family Medicine

## 2023-11-12 VITALS — BP 120/80 | HR 84 | Wt 160.0 lb

## 2023-11-12 DIAGNOSIS — N3281 Overactive bladder: Secondary | ICD-10-CM

## 2023-11-12 DIAGNOSIS — Z7189 Other specified counseling: Secondary | ICD-10-CM

## 2023-11-12 LAB — POCT URINALYSIS DIP (CLINITEK)
Bilirubin, UA: NEGATIVE
Blood, UA: NEGATIVE
Glucose, UA: NEGATIVE mg/dL
Ketones, POC UA: NEGATIVE mg/dL
Nitrite, UA: NEGATIVE
POC PROTEIN,UA: NEGATIVE
Spec Grav, UA: 1.01 (ref 1.010–1.025)
Urobilinogen, UA: 0.2 U/dL
pH, UA: 7.5 (ref 5.0–8.0)

## 2023-11-12 NOTE — Patient Instructions (Signed)

## 2023-11-12 NOTE — Progress Notes (Signed)
   Subjective:    Patient ID: [name], male    DOB: [birth year], 78 y.o.   MRN: [id number]  HPI He complains of a several month history of urinary urgency and occasional incontinence.  There is question whether he is having some frequency but no abdominal pain dysuria, fever or chills. He has multiple other issues that are complicating his care.  He is no longer driving due to cognitive impairment.  He also has OSA and evidence of parkinsonism.  Review of Systems     Objective:    Physical Exam Alert and in no distress.  Lower abdominal exam shows no masses or tenderness.       Assessment & Plan:  OAB (overactive bladder) - Plan: AMB Referral [facility] Care Management, POCT URINALYSIS DIP (CLINITEK)  Encounter for counseling for care management of patient with chronic conditions and complex health needs using nurse-based model I think he would benefit from chronic care management to help with all his medical concerns. I also gave him information concerning OAB and nonchemical ways to treat this.  I explained that he has multiple medications on board and adding another 1 could complicate his care.  If he continues have difficulty I will then consider using Myrbetriq.

## 2023-11-13 ENCOUNTER — Telehealth: Payer: Self-pay

## 2023-11-13 NOTE — Progress Notes (Signed)
 Complex Care Management Note  Care Guide Note 11/13/2023 Name: [name] MRN: [id number] DOB: 1946-01-03  [name] is a 78 y.o. year old male who sees Watson Hacking, MD for primary care. I reached out to Cherylyn Cos by phone today to offer complex care management services.  [name] was given information about Complex Care Management services today including:   The Complex Care Management services include support from the care team which includes your Nurse Care Manager, Clinical Social Worker, or Pharmacist.  The Complex Care Management team is here to help remove barriers to the health concerns and goals most important to you. Complex Care Management services are voluntary, and the patient may decline or stop services at any time by request to their care team member.   Complex Care Management Consent Status: Patient agreed to services and verbal consent obtained.   Follow up plan:  Telephone appointment with complex care management team member scheduled for:  12-24-23  Encounter Outcome:  Patient Scheduled  Creola Doheny [facility], [facility] Guide  Direct Dial: [phone]  Fax [phone]

## 2023-11-13 NOTE — Progress Notes (Signed)
 Complex Care Management Note  Care Guide Note 11/13/2023 Name: [name] MRN: [id number] DOB: 1945/08/17  [name] is a 78 y.o. year old male who sees Watson Hacking, MD for primary care. I reached out to Cherylyn Cos by phone today to offer complex care management services.  [name] was given information about Complex Care Management services today including:   The Complex Care Management services include support from the care team which includes your Nurse Care Manager, Clinical Social Worker, or Pharmacist.  The Complex Care Management team is here to help remove barriers to the health concerns and goals most important to you. Complex Care Management services are voluntary, and the patient may decline or stop services at any time by request to their care team member.   Complex Care Management Consent Status: Patient agreed to services and verbal consent obtained.   Follow up plan:  Telephone appointment with complex care management team member scheduled for:  11-27-23  Encounter Outcome:  Patient Scheduled   Creola Doheny [facility], [facility] Guide  Direct Dial: [phone]  Fax [phone]

## 2023-11-17 NOTE — Progress Notes (Unsigned)
 Assessment/Plan:   1.  Vascular Parkinsonism             -I had a long discussion with the patient and family.  I told the patient that I do not see evidence of idiopathic parkinsons disease but I do think that it is possible that the patient has vascular parkinsonism.  This is evidenced by the fact that the patient looks pretty good clinically when sitting down but looks more parkinsonian when ambulating, with short shuffling steps.  He is unable to have an MRI because of spinal cord stimulator, so we are unable to assess degree of small vessel disease.  He did have a CT brain in April, 2024.  -discussed exercise classes  -discussed walker at all times.  Shown U step today  -will call me in late feb/early march and we can order PT.    -exercise!  -d/c levodopa  for now.  Effectively only taking 1 per day since missing middle of the day dosage and the last one is at bedtime.  Will let me know if they think worse after d/c.        2.  Chronic vertigo             -This has been going on for many years             -on meclizine    3.  MCI             -suspect due to meds             -Neurocognitive testing with Dr. Kitty Perkins confirmed MCI in November, 2024 and with Dr. Virgie Griffith in April, 2025.  He mentioned medications as a role in diminishing cognition.  Subjective:   [name] was seen today in follow up for vascular parkinsonism.  Records reviewed since last visit.  Pt with wife who supplements hx. we decided to stop his levodopa  last visit.  He is doing about the same as he previously was on levodopa , which is characteristic for vascular parkinsonism.  He is unable to have an MRI because of his spinal cord stimulator.  He had repeat neurocognitive testing since our last visit.  This was done with Dr. Donavon Fudge in April, 2025.  This was only done a few months after his last one and results were stable.  He saw Tex Filbert in December, 2024.  Notes are reviewed.  Prior medications:  Levodopa  (was only taking twice daily and was not necessarily helping)   ALLERGIES:   Allergies  Allergen Reactions   Oxycodone  Nausea And Vomiting and Other (See Comments)    CURRENT MEDICATIONS:  No outpatient medications have been marked as taking for the 11/18/23 encounter (Appointment) with Anesa Fronek, [place], DO.     Objective:   PHYSICAL EXAMINATION:    VITALS:   There were no vitals filed for this visit.   GEN:  The patient appears stated age and is in NAD. HEENT:  Normocephalic, atraumatic.  The mucous membranes are moist. The superficial temporal arteries are without ropiness or tenderness. CV:  RRR Lungs:  CTAB Neck/HEME:  There are no carotid bruits bilaterally.  Neurological examination:  Orientation: The patient is alert and oriented x3. Cranial nerves: There is good facial symmetry without facial hypomimia. The speech is fluent and clear. Soft palate rises symmetrically and there is no tongue deviation. Hearing is intact to conversational tone. Sensation: Sensation is intact to light touch throughout Motor: Strength is at least antigravity x4.  Movement examination: Tone: There is nl tone in the bilateral upper extremities.  The tone in the lower extremities is nl.  Abnormal movements: none even with distraction procedures Coordination:  There is no decremation with RAM's, with any form of RAMS, including alternating supination and pronation of the forearm, hand opening and closing, finger taps, heel taps and toe taps.  Gait and Station: The patient has no difficulty arising out of a deep-seated chair without the use of the hands. The patient's stride length is decreased and he slightly drags the R leg.  He shuffles the more he ambulates.  He is forward flexed.  This is stable from prior visit.    I have reviewed and interpreted the following labs independently    Chemistry      Component Value Date/Time   NA 135 08/09/2023 1414   NA 142 07/09/2022 1230   K  3.7 08/09/2023 1414   CL 106 08/09/2023 1414   CO2 23 08/09/2023 1414   BUN 15 08/09/2023 1414   BUN 11 07/09/2022 1230   CREATININE 0.93 08/09/2023 1414   CREATININE 1.07 10/15/2016 1056      Component Value Date/Time   CALCIUM  8.9 08/09/2023 1414   ALKPHOS 51 09/09/2022 2041   AST 22 09/09/2022 2041   ALT 24 09/09/2022 2041   BILITOT 0.4 09/09/2022 2041   BILITOT 0.3 07/09/2022 1230       Lab Results  Component Value Date   WBC 6.7 08/09/2023   HGB 12.0 (L) 08/09/2023   HCT 37.8 (L) 08/09/2023   MCV 87.7 08/09/2023   PLT 264 08/09/2023    Lab Results  Component Value Date   TSH 0.69 02/19/2023     Total time spent on today's visit was 32 minutes, including both face-to-face time and nonface-to-face time.  Time included that spent on review of records (prior notes available to me/labs/imaging if pertinent), discussing treatment and goals, answering patient's questions and coordinating care.  Cc:  Watson Hacking, MD

## 2023-11-18 ENCOUNTER — Encounter: Payer: Self-pay | Admitting: Neurology

## 2023-11-18 ENCOUNTER — Ambulatory Visit (INDEPENDENT_AMBULATORY_CARE_PROVIDER_SITE_OTHER): Payer: Medicare Other | Admitting: Neurology

## 2023-11-18 VITALS — BP 112/64 | HR 73 | Ht 66.0 in | Wt 158.8 lb

## 2023-11-18 DIAGNOSIS — G214 Vascular parkinsonism: Secondary | ICD-10-CM

## 2023-11-18 NOTE — Patient Instructions (Signed)
 SAVE THE DATE!  We are planning a Parkinsons Disease educational symposium at [facility] in [place] on September 19.  More details to come!  We will have a movement disorder physician expert from [employer] coming to speak and a caregiver speaker.  We will have a panel of experts that will show you who you may need on your team of people on your journey with Parkinsons.  If you would like to be added to our email list to get further information, email [email or web address].  I hope to see you there!

## 2023-11-25 ENCOUNTER — Telehealth: Payer: Self-pay | Admitting: Neurology

## 2023-11-25 NOTE — Telephone Encounter (Signed)
 Called patient to schedule for the Skin Biopsy with Dr Tat, had to Leave a Voicemail. We will try back later

## 2023-11-27 ENCOUNTER — Other Ambulatory Visit: Payer: Self-pay | Admitting: Licensed Clinical Social Worker

## 2023-11-27 NOTE — Patient Outreach (Signed)
 Complex Care Management   Visit Note  11/27/2023  Name:  [name] MRN: [id number] DOB: [birth year]  Situation: Referral received for Complex Care Management related to Mental/Behavioral Health diagnosis MDD and GAD I obtained verbal consent from Patient.  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 Anastasia Balo   Chronic asthma 05/17/2011   Chronic low back pain with sciatica 11/14/2021   Chronic pain 11/21/2016   COPD (chronic obstructive pulmonary disease)    Dizziness 12/24/2021   Early satiety 09/11/2021   Erectile dysfunction    Esophageal motility disorder 09/11/2021   Generalized anxiety disorder    GERD (gastroesophageal reflux disease)    History of benign positional vertigo    History of colonic polyps 09/11/2021   History of kidney stones    Hoarseness 09/11/2021   Hyperlipidemia    Hypertension    Hypogonadism male    prior use on Testim    Insomnia    Iron deficiency 09/11/2021   Left ventricular dysfunction 10/12/2018   Leg pain, left 11/14/2021   Lumbar disc disease    Major depressive disorder    Mild concentric left ventricular hypertrophy (LVH)    Muscle spasm 11/14/2021   Old inferior myocardial infarction 1995   OSA (obstructive sleep apnea) 11/12/2022   NPSG 10/21/22- AHI 28.7/ hr, desat to 89%/Mean 93%, body weight 165 lbs     Pain in both feet 08/10/2021    Peripheral vascular disease 12/25/2021   Pneumonia    Presbycusis of both ears 04/23/2018   Shortness of breath dyspnea    Slow transit constipation 09/11/2021   Spinal cord stimulator status 11/14/2021   Spondylolisthesis of lumbar region 02/28/2015   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    Assessment: Patient Reported Symptoms:  Cognitive Cognitive Status: Alert and oriented to person, place, and time, Normal speech and language skills      Neurological Neurological Review of Symptoms: No symptoms reported Neurological Conditions: Parkinson's disease Neurological Management Strategies: Coping strategies, Routine screening  HEENT HEENT Symptoms Reported: No symptoms reported      Cardiovascular Cardiovascular Symptoms Reported: No symptoms reported Cardiovascular Conditions: Hypertension, Coronary artery disease Cardiovascular Management Strategies: Coping strategies, Medication therapy, Routine screening  Respiratory Respiratory Symptoms Reported: No symptoms reported Respiratory Conditions: Asthma, COPD  Endocrine Patient reports the following symptoms related to hypoglycemia or hyperglycemia : No symptoms reported Is patient diabetic?: No    Gastrointestinal Gastrointestinal Symptoms Reported: No symptoms reported      Genitourinary Genitourinary Symptoms Reported: Not assessed    Integumentary Integumentary Symptoms Reported: Not assessed    Musculoskeletal Musculoskelatal Symptoms Reviewed: Not assessed        Psychosocial Psychosocial Symptoms Reported: Other Other Psychosocial Conditions: Stress Additional Psychological Details: Patient reports stressors, including caring for adult child with chronic pain and adjusting to pt's chronic illness. Healthy coping  skills identified and pt receives strong support from family Behavioral Health Conditions: Anxiety, Depression Behavioral Management Strategies: Adequate rest, Coping strategies, Support system, Medication  therapy Major Change/Loss/Stressor/Fears (CP): Death of a loved one, Medical condition, family, Medical condition, self Techniques to Cope with Loss/Stress/Change: Diversional activities Quality of Family Relationships: helpful, involved, supportive Do you feel physically threatened by others?: No      12/17/2022    9:38 AM  Depression screen PHQ 2/9  Decreased Interest 0  Down, Depressed, Hopeless 0  PHQ - 2 Score 0  Altered sleeping 0  Tired, decreased energy 0  Change in appetite 0  Feeling bad or failure about yourself  0  Trouble concentrating 0  Moving slowly or fidgety/restless 0  Suicidal thoughts 0  PHQ-9 Score 0  Difficult doing work/chores Not difficult at all    There were no vitals filed for this visit.  Medications Reviewed Today     Reviewed by Adriana Albany, LCSW (Social Worker) on 11/27/23 at 1059  Med List Status: <None>   Medication Order Taking? Sig Documenting Provider Last Dose Status Informant  albuterol  (PROAIR  HFA) 108 (90 Base) MCG/ACT inhaler [id number] Yes Inhale 2 puffs into the lungs every 6 (six) hours as needed for wheezing or shortness of breath. Watson Hacking, MD  Active   ALPRAZolam  (XANAX ) 0.25 MG tablet [id number] Yes TAKE 1 TABLET BY MOUTH 2 TIMES DAILY AS NEEDED FOR ANXIETY. Lalonde, John C, MD  Active   atorvastatin  (LIPITOR ) 20 MG tablet [id number] Yes Take 1 tablet (20 mg total) by mouth daily. Watson Hacking, MD  Active   azelastine  (ASTELIN ) 0.1 % nasal spray [id number] Yes Place 2 sprays into both nostrils 2 (two) times daily. Use in each nostril as directed Watson Hacking, MD  Active   buPROPion  (WELLBUTRIN  XL) 300 MG 24 hr tablet [id number] Yes Take 1 tablet (300 mg total) by mouth daily. Watson Hacking, MD  Active   calcium  carbonate (OS-CAL) 600 MG TABS tablet [id number] Yes Take 600 mg by mouth daily with breakfast. [provider]  Active Self  cetirizine (ZYRTEC) 10 MG tablet [id number] Yes Take 10 mg by mouth daily.  [provider]  Active Self  clopidogrel  (PLAVIX ) 75 MG tablet [id number] Yes Take 1 tablet (75 mg total) by mouth daily. Hassan Links, MD  Active   desvenlafaxine  (PRISTIQ ) 50 MG 24 hr tablet [id number] Yes Take 1 tablet (50 mg total) by mouth daily. Lalonde, John C, MD  Active   diltiazem  (CARDIZEM  CD) 240 MG 24 hr capsule [id number] Yes Take 1 capsule (240 mg total) by mouth every other day. Hassan Links, MD  Active   ferrous sulfate 325 (65 FE) MG tablet [id number] Yes Take 325 mg by mouth daily. [provider]  Active Self           Med Note Jacqulynn Maxin Feb 27, 2023  8:49 AM)    Flaxseed, Linseed, (FLAX SEED OIL) 1000 MG CAPS [id number] Yes Take 1,000 mg by mouth every 7 (seven) days. [provider]  Active Self  GLUCOSAMINE CHONDROITIN COMPLX PO [id number] Yes Take 1 tablet by mouth daily. [provider]  Active Self  guaiFENesin (MUCINEX) 600 MG 12 hr tablet [id number] Yes Take 600 mg by mouth 2 (two) times daily as needed for cough. [provider]  Active Self  meclizine  (ANTIVERT ) 25 MG tablet [id number] Yes TAKE 1 TWICE A DAY AS NEEDED FOR DIZZINESS Lalonde,  Theadora Fines, MD  Active   methylphenidate  (CONCERTA ) 54 MG PO CR tablet [id number] Yes Take 1 tablet (54 mg total) by mouth every morning. Watson Hacking, MD  Active            Med Note Hazel Listen, Cindy Creed   Wed Nov 12, 2023  9:27 AM) Refill needed  Multiple Vitamins-Minerals (MULTIVITAMIN WITH MINERALS) tablet [id number] Yes Take 1 tablet by mouth daily. [provider]  Active Self  pantoprazole  (PROTONIX ) 40 MG tablet [id number] Yes Take 1 tablet (40 mg total) by mouth daily. Watson Hacking, MD  Active   sucralfate  (CARAFATE ) 1 GM/10ML suspension [id number] Yes Take 10 mLs (1 g total) by mouth daily. Hassan Links, MD  Active Self  sulfamethoxazole -trimethoprim  (BACTRIM  DS) 800-160 MG tablet [id number] Yes Take 1 tablet by mouth 2 (two) times daily. Watson Hacking, MD   Active   umeclidinium-vilanterol (ANORO ELLIPTA ) 62.5-25 MCG/ACT Romilda Coaster [id number] Yes Inhale 1 puff into the lungs daily at 6 (six) AM. Watson Hacking, MD  Active             Recommendation:   Continue Current Plan of Care  Follow Up Plan:   Telephone follow-up in 1 month  Alease Hunter, LCSW [facility]  [facility], [facility] Clinical Social Worker Direct Dial: [phone]  Fax: [phone] Website: [email or web address] 11:16 AM

## 2023-11-27 NOTE — Patient Instructions (Signed)
 Visit Information  Thank you for taking time to visit with me today. Please don't hesitate to contact me if I can be of assistance to you before our next scheduled appointment.  Our next appointment is by telephone on 07/20 at 10 AM Please call the care guide team at [phone] if you need to cancel or reschedule your appointment.   Following is a copy of your care plan:   Goals Addressed             This Visit's Progress    LCSW VBCI Social Work Care Plan   On track    Problems:   Disease Management support and education needs related to Depression: anxiety  CSW Clinical Goal(s):   Over the next 90 days the Patient will attend all scheduled medical appointments as evidenced by patient report and care team review of appointment completion in electronic MEDICAL RECORD NUMBER  demonstrate a reduction in symptoms related to Depression: anxiety .  Interventions:  Mental Health:  Evaluation of current treatment plan related to Depression: anxiety Active listening / Reflection utilized Financial risk analyst / information provided Emotional Support Provided Mindfulness or Relaxation training provided Provided general psycho-education for mental health needs Solution-Focued Strategies employed:  Patient Goals/Self-Care Activities:  Continue taking your medication as prescribed.   Increase coping skills and healthy habits  Plan:   Telephone follow up appointment with care management team member scheduled for:  2-4 weeks        Please call the Suicide and Crisis Lifeline: 988 go to [facility]gent [facility] [address], [place] [phone]) call 911 if you are experiencing a Mental Health or Behavioral Health Crisis or need someone to talk to.  Patient verbalizes understanding of instructions and care plan provided today and agrees to view in MyChart. Active MyChart status and patient understanding of how to access instructions and care plan  via MyChart confirmed with patient.     Arlis Bent [facility]  [facility], [facility] Clinical Social Worker Direct Dial: [phone]  Fax: [phone] Website: [email or web address] 11:18 AM

## 2023-12-17 ENCOUNTER — Other Ambulatory Visit: Payer: Self-pay | Admitting: Family Medicine

## 2023-12-17 DIAGNOSIS — F418 Other specified anxiety disorders: Secondary | ICD-10-CM

## 2023-12-20 NOTE — Progress Notes (Unsigned)
 Patient ID: [name], male    DOB: 10/01/1945, 78 y.o.   MRN: [id number]  HPI M never smoker followed for asthma, allergic rhinitis complicated by hx ADHD, CAD/ MI, GERD, anemia  NPSG 10/21/22- AHI 28.7/ hr, desat to 89%/Mean 93%, body weight 165 lbs  --------------------------------------------------------------------------------   03/18/23- 58 yo M never smoker followed for OSA, Asthma, allergic Rhinitis complicated by hx ADHD, CAD/ MI, HTN, GERD, anemia, back pain/spinal stimulator, Vascular Parkinson's,  -Anoro, ProAir , Singulair , zyrtec, Concerta  ER 54, xanax  0.25 mg bid prn,  CPAP auto 5-20/ [employer] Download compliance 67%, AHI 4.5/hr Body weight today-162 lbs Occasional cough and some dyspnea on exertion with hills and stairs.  No dramatic change. Download reviewed.  He does not like his CPAP mask.  We discussed changing. CXR 09/09/22-  No active cardiopulmonary disease. Borderline cardiomegaly.   06/24/23- 6 yo M never smoker followed for OSA, Asthma, allergic Rhinitis complicated by hx ADHD, CAD/ MI, HTN, GERD, anemia, back pain/spinal stimulator, Vascular Parkinson's,  -Anoro, ProAir , Singulair , zyrtec, Concerta  ER 54, xanax  0.25 mg bid prn,  CPAP auto 5-20/ [employer] Download compliance 77%, AHI 3.9/hr Body weight today-166 lbs Discussed the use of AI scribe software for clinical note transcription with the patient, who gave verbal consent to proceed.  History of Present Illness   The patient, with a history of sleep apnea and asthma, reports improved sleep with CPAP use, despite initial discomfort and occasional removal during sleep. He describes feeling groggy upon waking, but overall, he is adjusting to the device. The patient uses a full face mask and has had some issues with the Velcro part of the band, which he has temporarily fixed with tape. He has received a new mask but has not yet used it.  In addition to sleep apnea, the patient also has asthma, which is  reportedly well-controlled with his current inhalers. However, he notes that cold weather causes chest discomfort.  The patient also mentions a tremor, for which he was prescribed levoterazine by his neurologist. He was initially supposed to take it three times a day, but had difficulty remembering the midday dose. The neurologist suggested stopping the medication, but the patient preferred to continue taking it twice a day.   Assessment and Plan:    Obstructive Sleep Apnea Improved sleep with CPAP use, though inconsistent. Full face mask in use. Apnea-Hypopnea Index (AHI) less than 5, indicating good control. -Continue CPAP use nightly. -Encourage consistent use and address mask issues with home care company.  Asthma Stable, no acute exacerbations. Cold weather exacerbates chest discomfort. -Continue current inhaler regimen.  Tremor On Levoterazine, prescribed by neurologist. Difficulty with midday dose. -Continue Levoterazine twice daily as tolerated.  Follow-up in 6 months or sooner if any issues arise.   12/22/23- 48 yo M never smoker followed for OSA, Asthma, allergic Rhinitis complicated by hx ADHD, CAD/ MI, HTN, GERD, anemia, back pain/spinal stimulator, Vascular Parkinson's,  -Anoro, ProAir , Singulair , zyrtec, Concerta  ER 54, xanax  0.25 mg bid prn,  CPAP auto 5-20/ [employer] Download compliance  Body weight today- Sees Neurology for vascular Parkinson's.   CXR 08/09/23 FINDINGS: Cardiomediastinal silhouette and pulmonary vasculature are within normal limits. Lungs are clear. Neurostimulator leads seen terminating in the midthoracic spine. Lumbar fusion hardware partially visualized. IMPRESSION: No acute cardiopulmonary process.    Review of Systems-see HPI + = positive Constitutional:   No-   weight loss, night sweats, fevers, chills, fatigue, lassitude. HEENT:   No-current  headaches, difficulty swallowing, tooth/dental problems,  sore throat,       No-  sneezing, itching,  ear ache, no-nasal congestion, post nasal drip,  CV:  No-chest pain, No- orthopnea, PND, swelling in lower extremities, anasarca, dizziness, palpitations Resp: +   shortness of breath with exertion or at rest.              No-   productive cough,   +non-productive cough,  No- coughing up of blood.              No-   change in color of mucus.  No- wheezing.   Skin: No-   rash or lesions. GI:  No-   heartburn, indigestion, abdominal pain, nausea, vomiting,  GU:  MS:  No-   joint pain or swelling. Neuro-     nothing unusual Psych:  No- change in mood or affect. No depression or anxiety.  No memory loss.     Objective:   Physical Exam General- Alert, Oriented, Affect-appropriate, Distress- none acute Skin- rash-none, lesions- none, excoriation- none Lymphadenopathy- none Head- atraumatic            Eyes- Gross vision intact, PERRLA, conjunctivae clear secretions            Ears- Hearing, canals-normal            Nose- Clear, no-Septal dev, mucus, polyps, erosion, perforation             Throat- Mallampati II , mucosa clear, drainage- none, tonsils- atrophic. +weak voice Neck- flexible , trachea midline, +no stridor , thyroid  nl, carotid no bruit Chest - symmetrical excursion , unlabored           Heart/CV- RRR , no murmur , no gallop  , no rub, nl s1 s2                           - JVD- none , edema- none, stasis changes- none, varices- none           Lung-+ clear , good airflow,wheeze- none, cough- none , dullness-none, rub- none           Chest wall- + spinal stimulator Abd- Br/ Gen/ Rectal- Not done, not indicated Extrem- Normal apparent strength and mobility Neuro- grossly intact to observation

## 2023-12-22 ENCOUNTER — Encounter: Payer: Self-pay | Admitting: Internal Medicine

## 2023-12-22 ENCOUNTER — Ambulatory Visit (INDEPENDENT_AMBULATORY_CARE_PROVIDER_SITE_OTHER): Payer: Medicare Other | Admitting: Internal Medicine

## 2023-12-22 VITALS — BP 112/70 | HR 68 | Ht 67.0 in | Wt 162.0 lb

## 2023-12-22 DIAGNOSIS — G4733 Obstructive sleep apnea (adult) (pediatric): Secondary | ICD-10-CM

## 2023-12-22 NOTE — Patient Instructions (Signed)
 Order- DME Camelia- please bring Mr [name] in for face to face visit to refit mask of choice to control leak and improve comfort.

## 2023-12-23 ENCOUNTER — Other Ambulatory Visit: Payer: Self-pay | Admitting: Family Medicine

## 2023-12-23 ENCOUNTER — Ambulatory Visit: Payer: Medicare Other

## 2023-12-23 VITALS — BP 118/70 | HR 77 | Temp 97.6°F | Ht 67.0 in | Wt 162.0 lb

## 2023-12-23 DIAGNOSIS — F418 Other specified anxiety disorders: Secondary | ICD-10-CM

## 2023-12-23 DIAGNOSIS — Z0000 Encounter for general adult medical examination without abnormal findings: Secondary | ICD-10-CM | POA: Diagnosis not present

## 2023-12-23 DIAGNOSIS — F909 Attention-deficit hyperactivity disorder, unspecified type: Secondary | ICD-10-CM

## 2023-12-23 MED ORDER — METHYLPHENIDATE HCL ER (OSM) 54 MG PO TBCR: 54.0000 mg | EXTENDED_RELEASE_TABLET | ORAL | 0 refills | Status: DC

## 2023-12-23 MED ORDER — DESVENLAFAXINE SUCCINATE ER 50 MG PO TB24: 50.0000 mg | ORAL_TABLET | Freq: Every day | ORAL | 3 refills | Status: AC

## 2023-12-23 NOTE — Patient Instructions (Signed)
 [name] , Thank you for taking time out of your busy schedule to complete your Annual Wellness Visit with me. I enjoyed our conversation and look forward to speaking with you again next year. I, as well as your care team,  appreciate your ongoing commitment to your health goals. Please review the following plan we discussed and let me know if I can assist you in the future. Your Game plan/ To Do List    Referrals: If you haven't heard from the office you've been referred to, please reach out to them at the phone provided.  N/a Follow up Visits: Next Medicare AWV with our clinical staff: 01/04/2025 at 9:30   Have you seen your provider in the last 6 months (3 months if uncontrolled diabetes)? Yes Next Office Visit with your provider: 01/14/2024 at 10:15  Clinician Recommendations:  Aim for 30 minutes of exercise or brisk walking, 6-8 glasses of water , and 5 servings of fruits and vegetables each day.       This is a list of the screening recommended for you and due dates:  Health Maintenance  Topic Date Due   COVID-19 Vaccine (7 - 2024-25 season) 08/27/2023   Flu Shot  01/09/2024   Medicare Annual Wellness Visit  12/22/2024   DTaP/Tdap/Td vaccine (4 - Td or Tdap) 02/27/2027   Pneumococcal Vaccine for age over 22  Completed   Hepatitis C Screening  Completed   Zoster (Shingles) Vaccine  Completed   Hepatitis B Vaccine  Aged Out   HPV Vaccine  Aged Out   Meningitis B Vaccine  Aged Out   Colon Cancer Screening  Discontinued    Advanced directives: (In Chart) A copy of your advanced directives are scanned into your chart should your provider ever need it. Advance Care Planning is important because it:  [x]  Makes sure you receive the medical care that is consistent with your values, goals, and preferences  [x]  It provides guidance to your family and loved ones and reduces their decisional burden about whether or not they are making the right decisions based on your wishes.  Follow  the link provided in your after visit summary or read over the paperwork we have mailed to you to help you started getting your Advance Directives in place. If you need assistance in completing these, please reach out to us  so that we can help you!  See attachments for Preventive Care and Fall Prevention Tips.

## 2023-12-23 NOTE — Progress Notes (Signed)
 Subjective:   [name] is a 78 y.o. who presents for a Medicare Wellness preventive visit.  As a reminder, Annual Wellness Visits don't include a physical exam, and some assessments may be limited, especially if this visit is performed virtually. We may recommend an in-person follow-up visit with your provider if needed.  Visit Complete: In person    Persons Participating in Visit: Patient assisted by spouse.  AWV Questionnaire: No: Patient Medicare AWV questionnaire was not completed prior to this visit.  Cardiac Risk Factors include: advanced age (>30men, >12 women);dyslipidemia;hypertension;male gender     Objective:    Today's Vitals   12/23/23 0930  BP: 118/70  Pulse: 77  Temp: 97.6 F (36.4 C)  TempSrc: Oral  SpO2: 98%  Weight: 162 lb (73.5 kg)  Height: 5' 7 (1.702 m)   Body mass index is 25.37 kg/m.     12/23/2023    9:40 AM 11/18/2023    1:09 PM 08/09/2023    1:08 PM 05/30/2023    3:12 PM 05/20/2023   12:53 PM 02/19/2023    7:57 AM 12/17/2022    9:35 AM  Advanced Directives  Does Patient Have a Medical Advance Directive? Yes Yes No Yes Yes Yes Yes  Type of Estate agent of [place];Living will Living will  Healthcare Power of Attorney Living will Healthcare Power of [employer] Power of [place];Living will  Does patient want to make changes to medical advance directive?    No - Patient declined  No - Patient declined   Copy of Healthcare Power of Attorney in Chart? Yes - validated most recent copy scanned in chart (See row information)     No - copy requested Yes - validated most recent copy scanned in chart (See row information)    Current Medications (verified) Outpatient Encounter Medications as of 12/23/2023  Medication Sig   albuterol  (PROAIR  HFA) 108 (90 Base) MCG/ACT inhaler Inhale 2 puffs into the lungs every 6 (six) hours as needed for wheezing or shortness of breath.   ALPRAZolam  (XANAX ) 0.25 MG tablet TAKE 1  TABLET BY MOUTH 2 TIMES DAILY AS NEEDED FOR ANXIETY.   atorvastatin  (LIPITOR ) 20 MG tablet Take 1 tablet (20 mg total) by mouth daily.   azelastine  (ASTELIN ) 0.1 % nasal spray Place 2 sprays into both nostrils 2 (two) times daily. Use in each nostril as directed   buPROPion  (WELLBUTRIN  XL) 300 MG 24 hr tablet TAKE 1 TABLET BY MOUTH DAILY   calcium  carbonate (OS-CAL) 600 MG TABS tablet Take 600 mg by mouth daily with breakfast.   cetirizine (ZYRTEC) 10 MG tablet Take 10 mg by mouth daily.   clopidogrel  (PLAVIX ) 75 MG tablet Take 1 tablet (75 mg total) by mouth daily.   desvenlafaxine  (PRISTIQ ) 50 MG 24 hr tablet Take 1 tablet (50 mg total) by mouth daily.   diltiazem  (CARDIZEM  CD) 240 MG 24 hr capsule Take 1 capsule (240 mg total) by mouth every other day.   ferrous sulfate 325 (65 FE) MG tablet Take 325 mg by mouth daily.   Flaxseed, Linseed, (FLAX SEED OIL) 1000 MG CAPS Take 1,000 mg by mouth every 7 (seven) days.   GLUCOSAMINE CHONDROITIN COMPLX PO Take 1 tablet by mouth daily.   guaiFENesin (MUCINEX) 600 MG 12 hr tablet Take 600 mg by mouth 2 (two) times daily as needed for cough.   meclizine  (ANTIVERT ) 25 MG tablet TAKE 1 TWICE A DAY AS NEEDED FOR DIZZINESS   methylphenidate  (CONCERTA ) 54 MG  PO CR tablet Take 1 tablet (54 mg total) by mouth every morning.   Multiple Vitamins-Minerals (MULTIVITAMIN WITH MINERALS) tablet Take 1 tablet by mouth daily.   pantoprazole  (PROTONIX ) 40 MG tablet Take 1 tablet (40 mg total) by mouth daily.   umeclidinium-vilanterol (ANORO ELLIPTA ) 62.5-25 MCG/ACT AEPB Inhale 1 puff into the lungs daily at 6 (six) AM.   sucralfate  (CARAFATE ) 1 GM/10ML suspension Take 10 mLs (1 g total) by mouth daily. (Patient not taking: Reported on 12/23/2023)   sulfamethoxazole -trimethoprim  (BACTRIM  DS) 800-160 MG tablet Take 1 tablet by mouth 2 (two) times daily. (Patient not taking: Reported on 12/23/2023)   No facility-administered encounter medications on file as of 12/23/2023.     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 Blanca   Chronic asthma 05/17/2011   Chronic low back pain with sciatica 11/14/2021   Chronic pain 11/21/2016   COPD (chronic obstructive pulmonary disease)    Dizziness 12/24/2021   Early satiety 09/11/2021   Erectile dysfunction    Esophageal motility disorder 09/11/2021   Generalized anxiety disorder    GERD (gastroesophageal reflux disease)    History of benign positional vertigo    History of colonic polyps 09/11/2021   History of kidney stones    Hoarseness 09/11/2021   Hyperlipidemia    Hypertension    Hypogonadism male    prior use on Testim    Insomnia    Iron deficiency 09/11/2021   Left ventricular dysfunction 10/12/2018   Leg pain, left 11/14/2021   Lumbar disc disease    Major depressive disorder    Mild concentric left ventricular hypertrophy (LVH)    Muscle spasm 11/14/2021   Old inferior myocardial infarction 1995   OSA (obstructive sleep apnea) 11/12/2022   NPSG 10/21/22- AHI 28.7/ hr, desat to 89%/Mean 93%, body weight 165 lbs     Pain in both feet 08/10/2021   Peripheral vascular disease 12/25/2021   Pneumonia    Presbycusis of both ears 04/23/2018   Shortness of breath dyspnea    Slow transit constipation 09/11/2021   Spinal cord stimulator status 11/14/2021   Spondylolisthesis of lumbar region 02/28/2015   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 Blanca   CHEILECTOMY  10/16/2011   Procedure: CHEILECTOMY;  Surgeon: Deward DELENA Schwartz, MD;  Location: [facility];  Service: Orthopedics;  Laterality: Right;   CHOLECYSTECTOMY     COLONOSCOPY     CORONARY ANGIOPLASTY     1995 after MI   CORONARY ANGIOPLASTY WITH STENT PLACEMENT     1999    ESOPHAGOGASTRODUODENOSCOPY  02/18/2012   Procedure: ESOPHAGOGASTRODUODENOSCOPY (EGD);  Surgeon: Lamar LULLA Bunk, MD;  Location: [facility];  Service: Endoscopy;  Laterality: N/A;   FOOT SURGERY     joint scraped, arthritis right foot   HERNIA REPAIR     LUMBAR DISC SURGERY     x2   right index finger  mass removed- arthritis     Dr. Camella   [facility]  Right 05/18/2021   medial toe consistent with callus   SKIN BIOPSY Left 05/17/2021   actinic keratosis   SPINAL CORD STIMULATOR BATTERY EXCHANGE N/A 10/11/2021   Procedure: SPINAL CORD STIMULATOR BATTERY EXCHANGE;  Surgeon: Burnetta Aures, MD;  Location: [facility];  Service: Orthopedics;  Laterality: N/A;  60 mins   SPINAL CORD STIMULATOR INSERTION N/A 11/21/2016   Procedure: LUMBAR SPINAL CORD STIMULATOR INSERTION;  Surgeon: Burnetta Aures, MD;  Location: [facility];  Service: Orthopedics;  Laterality: N/A;  Requests 2.5 hrs   TONSILLECTOMY     1953   UPPER GI ENDOSCOPY  [id number]   VASECTOMY     1980   Family History  Problem Relation Age of Onset   Diabetes Mother    Dementia Mother    Heart disease Mother    Parkinson's disease Mother    Heart disease Father    Dementia Father    Fibromyalgia Daughter    Cancer Neg Hx    Social History   Socioeconomic History   Marital status: Married    Spouse name: Not on file   Number of children: 2    Years of education: 42   Highest education level: Master's degree (e.g., MA, MS, MEng, MEd, MSW, MBA)  Occupational History   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   Right handed    One ranch home   [name] wife lives with him   Artist (paints)   Super nice man and wife, married for 52 years this year.   Social Drivers of Corporate investment banker Strain: Low Risk  (12/23/2023)   Overall Financial Resource Strain (CARDIA)    Difficulty of Paying Living Expenses: Not hard at all  Food Insecurity: No Food Insecurity (12/23/2023)   Hunger Vital Sign    Worried About Running Out of Food in the Last Year: Never true    Ran Out of Food in the Last Year: Never true  Transportation Needs: No Transportation Needs (12/23/2023)   PRAPARE - Administrator, Civil Service (Medical): No    Lack of Transportation (Non-Medical): No  Physical Activity: Inactive (12/23/2023)   Exercise Vital Sign    Days of Exercise per Week: 0 days    Minutes of Exercise per Session: 0 min  Stress: No Stress Concern Present (12/23/2023)   [employer] of Occupational Health - Occupational Stress Questionnaire    Feeling of Stress: Only a little  Social Connections: Socially Integrated (12/23/2023)   Social Connection and Isolation Panel    Frequency of Communication with Friends and Family: More than  three times a week    Frequency of Social Gatherings with Friends and Family: More than three times a week    Attends Religious Services: More than 4 times  per year    Active Member of Clubs or Organizations: Yes    Attends Engineer, structural: More than 4 times per year    Marital Status: Married    Tobacco Counseling Counseling given: Not Answered    Clinical Intake:  Pre-visit preparation completed: Yes  Pain : No/denies pain     Nutritional Risks: None Diabetes: No  Lab Results  Component Value Date   HGBA1C 5.4 05/19/2012     How often do you need to have someone help you when you read instructions, pamphlets, or other written materials from your doctor or pharmacy?: 1 - Never  Interpreter Needed?: No  Information entered by :: NAllen LPN   Activities of Daily Living     12/23/2023    9:32 AM  In your present state of health, do you have any difficulty performing the following activities:  Hearing? 1  Comment has hearing aids  Vision? 0  Difficulty concentrating or making decisions? 1  Walking or climbing stairs? 1  Dressing or bathing? 0  Doing errands, shopping? 1  Preparing Food and eating ? N  Using the Toilet? N  In the past six months, have you accidently leaked urine? N  Do you have problems with loss of bowel control? N  Managing your Medications? Y  Managing your Finances? N  Housekeeping or managing your Housekeeping? Y    Patient Care Team: [name], MD as PCP - General (Family Medicine) Monetta Redell PARAS, MD as PCP - Cardiology (Cardiology) Blanca Elsie RAMAN, MD as Consulting Physician (Cardiology) Neysa Reggy BIRCH, MD as Consulting Physician (Pulmonary Disease) Tat, Asberry RAMAN, DO as Consulting Physician (Neurology) Lewis, Jasmine D, LCSW as VBCI Care Management (Licensed Clinical Social Worker)  I have updated your Care Teams any recent Medical Services you may have received from other providers in the past year.     Assessment:   This is a routine wellness examination for [place].  Hearing/Vision screen Hearing Screening - Comments:: Has hearing aids that are  maintained Vision Screening - Comments:: Regular eye exams, [facility]   Goals Addressed             This Visit's Progress    Patient Stated       12/23/2023, stay alive       Depression Screen     12/23/2023    9:42 AM 12/17/2022    9:38 AM 05/15/2022    9:12 AM 12/07/2021   11:37 AM 12/04/2020    2:01 PM 08/17/2019    9:25 AM 04/23/2018   10:27 AM  PHQ 2/9 Scores  PHQ - 2 Score 0 0 1 0 0 0 0  PHQ- 9 Score 1 0 3 1 1       Fall Risk     12/23/2023    9:41 AM 12/22/2023   11:05 AM 11/18/2023    1:09 PM 05/30/2023    3:12 PM 05/20/2023   12:52 PM  Fall Risk   Falls in the past year? 0 1 0 0 0  Number falls in past yr: 0 0 0 0 0  Injury with Fall? 0 0 0 0 0  Risk for fall due to : Medication side effect;Impaired mobility;Impaired balance/gait      Follow up Falls evaluation completed;Falls prevention discussed  Falls evaluation completed Falls evaluation completed Falls evaluation completed    MEDICARE RISK AT HOME:  Medicare Risk at Home Any stairs in or around the home?: Yes (has a ramp) If so, are there any without handrails?: No Home free of loose throw rugs in walkways, pet beds, electrical cords, etc?: Yes Adequate lighting in your home to reduce risk of falls?: Yes Life alert?: No Use of a cane, walker or w/c?: Yes Grab bars in the bathroom?: Yes Shower chair or bench in shower?: Yes Elevated toilet seat or a handicapped toilet?: Yes  TIMED UP AND GO:  Was the test performed?  Yes  Length of time to ambulate 10 feet: 7 sec Gait slow and steady with assistive device  Cognitive Function: 6CIT completed    01/01/2023   10:22 AM  MMSE - Mini Mental State Exam  Orientation to time 5  Orientation to Place 3  Registration 3  Attention/ Calculation 4  Recall 3  Language- name 2 objects 2  Language- repeat 1  Language- follow 3 step command 3  Language- read & follow direction 1  Write a sentence 1  Copy design 1  Total score 27      02/19/2023    11:00 AM  Montreal Cognitive Assessment   Visuospatial/ Executive (0/5) 4  Naming (0/3) 3  Attention: Read list of digits (0/2) 2  Attention: Read list of letters (0/1) 1  Attention: Serial 7 subtraction starting at 100 (0/3) 1  Language: Repeat phrase (0/2) 2  Language : Fluency (0/1) 0  Abstraction (0/2) 1  Delayed Recall (0/5) 3  Orientation (0/6) 5  Total 22  Adjusted Score (based on education) 22      12/23/2023    9:44 AM 12/17/2022    9:39 AM 12/07/2021   11:41 AM  6CIT Screen  What Year? 0 points 4 points 0 points  What month? 0 points 0 points 0 points  What time? 3 points 0 points 0 points  Count back from 20 0 points 0 points 0 points  Months in reverse 2 points 2 points 0 points  Repeat phrase 10 points 6 points 0 points  Total Score 15 points 12 points 0 points    Immunizations Immunization History  Administered Date(s) Administered   Fluad Quad(high Dose 65+) 04/14/2020, 03/13/2021, 05/15/2022   Fluad Trivalent(High Dose 65+) 02/27/2023   Influenza Split 04/23/2011   Influenza Whole 03/20/2009, 03/19/2010   Influenza, High Dose Seasonal PF 04/12/2016, 02/26/2017, 04/02/2018, 01/30/2019, 01/30/2019   Influenza,inj,Quad PF,6+ Mos 03/05/2013, 02/23/2014, 04/21/2015   Influenza-Unspecified 04/02/2018, 01/30/2019   PFIZER Comirnaty(Gray Top)Covid-19 Tri-Sucrose Vaccine 06/22/2020   PFIZER(Purple Top)SARS-COV-2 Vaccination 07/02/2019, 07/23/2019   Pfizer Covid-19 Vaccine Bivalent Booster 85yrs & up 03/13/2021   Pfizer(Comirnaty)Fall Seasonal Vaccine 12 years and older 05/15/2022, 02/27/2023   Pneumococcal Conjugate-13 03/04/2014   Pneumococcal Polysaccharide-23 06/10/2005, 05/19/2012   Td 10/20/2006   Tdap 10/20/2006, 02/26/2017   Zoster Recombinant(Shingrix) 12/10/2016, 07/15/2017   Zoster, Live 01/01/2007    Screening Tests Health Maintenance  Topic Date Due   COVID-19 Vaccine (7 - 2024-25 season) 08/27/2023   INFLUENZA VACCINE  01/09/2024   Medicare  Annual Wellness (AWV)  12/22/2024   DTaP/Tdap/Td (4 - Td or Tdap) 02/27/2027   Pneumococcal Vaccine: 50+ Years  Completed   Hepatitis C Screening  Completed   Zoster Vaccines- Shingrix  Completed   Hepatitis B Vaccines  Aged Out   HPV VACCINES  Aged Out   Meningococcal B Vaccine  Aged Out  Colonoscopy  Discontinued    Health Maintenance  Health Maintenance Due  Topic Date Due   COVID-19 Vaccine (7 - 2024-25 season) 08/27/2023   Health Maintenance Items Addressed: Up to date  Additional Screening:  Vision Screening: Recommended annual ophthalmology exams for early detection of glaucoma and other disorders of the eye. Would you like a referral to an eye doctor? No    Dental Screening: Recommended annual dental exams for proper oral hygiene  Community Resource Referral / Chronic Care Management: CRR required this visit?  No   CCM required this visit?  No   Plan:    I have personally reviewed and noted the following in the patient's chart:   Medical and social history Use of alcohol, tobacco or illicit drugs  Current medications and supplements including opioid prescriptions. Patient is not currently taking opioid prescriptions. Functional ability and status Nutritional status Physical activity Advanced directives List of other physicians Hospitalizations, surgeries, and ER visits in previous 12 months Vitals Screenings to include cognitive, depression, and falls Referrals and appointments  In addition, I have reviewed and discussed with patient certain preventive protocols, quality metrics, and best practice recommendations. A written personalized care plan for preventive services as well as general preventive health recommendations were provided to patient.   Ardella FORBES Dawn, LPN   [id number]/84/7974   After Visit Summary: (In Person-Printed) AVS printed and given to the patient  Notes: Please refer to Routing Comments.

## 2023-12-24 ENCOUNTER — Ambulatory Visit: Payer: Self-pay | Admitting: Neurology

## 2023-12-24 ENCOUNTER — Telehealth: Payer: Self-pay

## 2023-12-24 ENCOUNTER — Ambulatory Visit (INDEPENDENT_AMBULATORY_CARE_PROVIDER_SITE_OTHER): Admitting: Neurology

## 2023-12-24 ENCOUNTER — Ambulatory Visit
Admission: RE | Admit: 2023-12-24 | Discharge: 2023-12-24 | Disposition: A | Discharge status: Home / Self Care | Source: Ambulatory Visit | Attending: Neurology | Admitting: Neurology

## 2023-12-24 DIAGNOSIS — R410 Disorientation, unspecified: Secondary | ICD-10-CM | POA: Diagnosis not present

## 2023-12-24 DIAGNOSIS — G214 Vascular parkinsonism: Secondary | ICD-10-CM

## 2023-12-24 NOTE — Progress Notes (Signed)
 Assessment/Plan:   1.  Vascular Parkinsonism             - This is evidenced by the fact that the patient looks pretty good clinically when sitting down but looks more parkinsonian when ambulating, with short shuffling steps.  He is unable to have an MRI because of spinal cord stimulator, so we are unable to assess degree of small vessel disease.  He did have a CT brain in April, 2024.  - Skin biopsy done today after consent from patient and wife       2.  Chronic vertigo             -This has been going on for many years             -on meclizine    3.  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 Gayland in April, 2025.  He mentioned medications as a role in diminishing cognition.  4.  Fall  - Wife felt patient had more confusion after fall.  I did not really note to that today when I was in the examining room, but certainly have concerned about that given that she has noted that.  Patient and wife gave consent that they wanted to proceed with the biopsy, and I agreed with that given that I did not notice any mental status change.  However, we all agreed that patient should have a CT brain done today.  Patient is on Plavix .  Subjective:   [name] was seen today.  Patient originally in for skin biopsy.  Wife had originally mentioned to staff that patient had fallen the day before and she felt that he was more confused.  Patient denied any confusion.  Patient noted that he did not hit his head.  He felt that the fall was because his shoes were slippery.  He fell and hit the right shoulder.  He is on Plavix .  Prior medications: Levodopa  (was only taking twice daily and was not necessarily helping)   ALLERGIES:   Allergies  Allergen Reactions   Oxycodone  Nausea And Vomiting and Other (See Comments)    CURRENT MEDICATIONS:  No outpatient medications have been marked as taking for the  12/24/23 encounter (Procedure visit) with [place], Asberry RAMAN, DO.     Objective:   PHYSICAL EXAMINATION:    VITALS:   There were no vitals filed for this visit.  GEN:  The patient appears stated age and is in NAD. HEENT:  Normocephalic, atraumatic.    Neurological examination:  Orientation: The patient is alert and oriented x3.  Patient knew this examiner well immediately upon walking in the room.  He was able to appropriately interact.  He knew his medications.  He asked appropriate questions.  He had several jokes that he told me and was able to remember those. Cranial nerves: There is good facial symmetry without facial hypomimia. The speech is fluent and clear. Soft palate rises symmetrically and there is no tongue deviation. Hearing is intact to conversational tone.  He is wearing his hearing aids. Sensation: Sensation is intact to light touch throughout Motor: Strength is at least antigravity x4.  Movement examination: Tone: There is nl tone in the bilateral upper extremities.  The tone in the lower extremities is nl.  Abnormal movements: none even with distraction procedures Coordination:  There is no decremation with RAM's, with any form of RAMS, including alternating supination and pronation of the forearm, hand opening and closing, finger taps, heel taps and toe taps.  Gait and Station: Not tested, as the patient was already laying on the examination bed for the biopsy  I have reviewed and interpreted the following labs independently    Chemistry      Component Value Date/Time   NA 135 08/09/2023 1414   NA 142 07/09/2022 1230   K 3.7 08/09/2023 1414   CL 106 08/09/2023 1414   CO2 23 08/09/2023 1414   BUN 15 08/09/2023 1414   BUN 11 07/09/2022 1230   CREATININE 0.93 08/09/2023 1414   CREATININE 1.07 10/15/2016 1056      Component Value Date/Time   CALCIUM  8.9 08/09/2023 1414   ALKPHOS 51 09/09/2022 2041   AST 22 09/09/2022 2041   ALT 24 09/09/2022 2041   BILITOT 0.4  09/09/2022 2041   BILITOT 0.3 07/09/2022 1230       Lab Results  Component Value Date   WBC 6.7 08/09/2023   HGB 12.0 (L) 08/09/2023   HCT 37.8 (L) 08/09/2023   MCV 87.7 08/09/2023   PLT 264 08/09/2023    Lab Results  Component Value Date   TSH 0.69 02/19/2023      Cc:  [name], MD

## 2023-12-24 NOTE — Patient Instructions (Signed)
 INFORMATION FOR PATIENTS AFTER SKIN BIOPSY:   What You Need to Do:  1. Keep your current (large) bandage on for 24 hours and do not shower during this time. Leave today's bandage on until AFTER your next shower tomorrow.  Change your bandages every day starting tomorrow after that shower. Keep the bandages on while you shower. Change them once a day until a scab forms.  2. You can let the small steristrips fall off naturally and do not need to ever take them off.  They will eventually fall off on their own (after showering)  3. You may take showers after 24 hours, but DO NOT take tub baths, go in hot tubs or go swimming for seven days after the procedure.  4. You may use vasoline, bacitracin, or Polysporin ointment on the wounds as needed.  5. If a scab forms at the biopsy site, leave it alone.  6. If bleeding occurs, apply firm pressure for two minutes with a clean piece of gauze.   Please contact us  immediately if there is any redness, any signs of infection, or significant bleeding at the biopsy site.   Please call our office at [phone].

## 2023-12-24 NOTE — Procedures (Signed)
 Punch Biopsy Procedure Note  Preprocedure Diagnosis: Bradykinesia; Tremor;   Postprocedure Diagnosis: same  Locations: Site 1: Right posterior cervical Site 2: above, right knee;  Site 3: above, right foot  Indications: r/o alpha synucleinopathy  Anesthesia: 3 mL Lidocaine  1% with epinephrine  without added sodium bicarbonate  Procedure Details Patient informed of the risks (including but not limited to bleeding, pain, infection, scar and infection) and benefits of the procedure.  Informed consent obtained.  The areas which were chosen for biopsy, as above, and surrounding areas were given a sterile prep using betadyne and draped in the usual sterile fashion. The skin was then stretched perpendicular to the skin tension lines and sample removed using the 3 mm punch. Pressure applied, hemostasis achieved.   Dressing applied. The specimen(s) was sent for pathologic examination. The patient tolerated the procedure well.  Estimated Blood Loss: 0 ml  Condition: Stable  Complications: none.  Plan: 1. Instructed to keep the wound dry and covered for 24-48h and clean thereafter. 2. Warning signs of infection were reviewed.

## 2023-12-26 ENCOUNTER — Other Ambulatory Visit: Admitting: Neurology

## 2023-12-29 ENCOUNTER — Other Ambulatory Visit: Payer: Self-pay | Admitting: Licensed Clinical Social Worker

## 2023-12-29 NOTE — Patient Outreach (Signed)
 Complex Care Management   Visit Note  12/29/2023  Name:  [name] MRN: [id number] DOB: June 13, 1945  Situation: Referral received for Complex Care Management related to Mental/Behavioral Health diagnosis MDD/Anxiety I obtained verbal consent from Patient.  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 Blanca   Chronic asthma 05/17/2011   Chronic low back pain with sciatica 11/14/2021   Chronic pain 11/21/2016   COPD (chronic obstructive pulmonary disease)    Dizziness 12/24/2021   Early satiety 09/11/2021   Erectile dysfunction    Esophageal motility disorder 09/11/2021   Generalized anxiety disorder    GERD (gastroesophageal reflux disease)    History of benign positional vertigo    History of colonic polyps 09/11/2021   History of kidney stones    Hoarseness 09/11/2021   Hyperlipidemia    Hypertension    Hypogonadism male    prior use on Testim    Insomnia    Iron deficiency 09/11/2021   Left ventricular dysfunction 10/12/2018   Leg pain, left 11/14/2021   Lumbar disc disease    Major depressive disorder    Mild concentric left ventricular hypertrophy (LVH)    Muscle spasm 11/14/2021   Old inferior myocardial infarction 1995   OSA (obstructive sleep apnea) 11/12/2022   NPSG 10/21/22- AHI 28.7/ hr, desat to 89%/Mean 93%, body weight 165 lbs     Pain in both feet 08/10/2021    Peripheral vascular disease 12/25/2021   Pneumonia    Presbycusis of both ears 04/23/2018   Shortness of breath dyspnea    Slow transit constipation 09/11/2021   Spinal cord stimulator status 11/14/2021   Spondylolisthesis of lumbar region 02/28/2015   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    Assessment: Patient Reported Symptoms:  Cognitive Cognitive Status: Alert and oriented to person, place, and time, Normal speech and language skills   Health Maintenance Behaviors: Annual physical exam, Stress management Health Facilitated by: Stress management  Neurological Neurological Review of Symptoms: No symptoms reported Neurological Management Strategies: Routine screening, Coping strategies Neurological Comment: Patient is awaiting biopsy results from Neurologist  HEENT HEENT Symptoms Reported: Not assessed      Cardiovascular Cardiovascular Symptoms Reported: Not assessed    Respiratory Respiratory Symptoms Reported: Not assesed    Endocrine Endocrine Symptoms Reported: Not assessed    Gastrointestinal Gastrointestinal Symptoms Reported: Not assessed      Genitourinary Genitourinary Symptoms Reported: Not assessed    Integumentary Integumentary Symptoms Reported: Not assessed    Musculoskeletal Musculoskelatal Symptoms Reviewed: Unsteady gait Musculoskeletal Management Strategies: Routine screening Falls in the past year?: Yes Number of falls in past year: 1 or less Was there an injury with Fall?: No Fall Risk Category Calculator: 1 Patient Fall Risk Level: Low Fall Risk    Psychosocial Psychosocial Symptoms Reported: Anxiety - if selected complete GAD Additional Psychological Details: Patient  identified stressors, including waiting for biopsy results and caregiver stress. Strategies to assist were discussed Behavioral Management Strategies: Adequate rest, Coping strategies, Support system Major Change/Loss/Stressor/Fears (CP): Death of a loved one, Medical condition, self,  Medical condition, family Techniques to [place] with Loss/Stress/Change: Diversional activities Quality of Family Relationships: helpful, involved, supportive      12/23/2023    9:42 AM  Depression screen PHQ 2/9  Decreased Interest 0  Down, Depressed, Hopeless 0  PHQ - 2 Score 0  Altered sleeping 0  Tired, decreased energy 1  Change in appetite 0  Feeling bad or failure about yourself  0  Trouble concentrating 0  Moving slowly or fidgety/restless 0  Suicidal thoughts 0  PHQ-9 Score 1  Difficult doing work/chores Not difficult at all    There were no vitals filed for this visit.  Medications Reviewed Today     Reviewed by Chester Sibert D, LCSW (Social Worker) on 12/29/23 at 1009  Med List Status: <None>   Medication Order Taking? Sig Documenting Provider Last Dose Status Informant  albuterol  (PROAIR  HFA) 108 (90 Base) MCG/ACT inhaler [id number]  Inhale 2 puffs into the lungs every 6 (six) hours as needed for wheezing or shortness of breath. [name], MD  Active   ALPRAZolam  (XANAX ) 0.25 MG tablet [id number]  TAKE 1 TABLET BY MOUTH 2 TIMES DAILY AS NEEDED FOR ANXIETY. Lalonde, John C, MD  Active   atorvastatin  (LIPITOR ) 20 MG tablet [id number]  Take 1 tablet (20 mg total) by mouth daily. Lalonde, John C, MD  Active   azelastine  (ASTELIN ) 0.1 % nasal spray [id number]  Place 2 sprays into both nostrils 2 (two) times daily. Use in each nostril as directed [name], MD  Active   buPROPion  (WELLBUTRIN  XL) 300 MG 24 hr tablet [id number]  TAKE 1 TABLET BY MOUTH DAILY Lalonde, John C, MD  Active   calcium  carbonate (OS-CAL) 600 MG TABS tablet [id number]  Take 600 mg by mouth daily with breakfast. [provider]  Active Self  cetirizine (ZYRTEC) 10 MG tablet [id number]  Take 10 mg by mouth daily. [provider]  Active Self  clopidogrel  (PLAVIX ) 75 MG tablet [id number]  Take 1 tablet (75 mg total) by mouth daily. Monetta Redell PARAS, MD  Active   desvenlafaxine  (PRISTIQ )  50 MG 24 hr tablet [id number]  Take 1 tablet (50 mg total) by mouth daily. Lalonde, John C, MD  Active   diltiazem  (CARDIZEM  CD) 240 MG 24 hr capsule [id number]  Take 1 capsule (240 mg total) by mouth every other day. Monetta Redell PARAS, MD  Active   ferrous sulfate 325 (65 FE) MG tablet [id number]  Take 325 mg by mouth daily. [provider]  Active Self           Med Note ABE ZANE CHRISTELLA [place] Feb 27, 2023  8:49 AM)    Flaxseed, Linseed, (FLAX SEED OIL) 1000 MG CAPS [id number]  Take 1,000 mg by mouth every 7 (seven) days. [provider]  Active Self  GLUCOSAMINE CHONDROITIN COMPLX PO [id number]  Take 1 tablet by mouth daily. [provider]  Active Self  guaiFENesin (MUCINEX) 600 MG 12 hr tablet [id number]  Take 600 mg by mouth 2 (two) times daily as needed for cough. [provider]  Active Self  meclizine  (ANTIVERT ) 25 MG tablet [id number]  TAKE 1 TWICE A DAY AS NEEDED FOR DIZZINESS Lalonde, John C, MD  Active   methylphenidate  (CONCERTA ) 54 MG PO  CR tablet [id number]  Take 1 tablet (54 mg total) by mouth every morning. Lalonde, John C, MD  Active   Multiple Vitamins-Minerals (MULTIVITAMIN WITH MINERALS) tablet [id number]  Take 1 tablet by mouth daily. [provider]  Active Self  pantoprazole  (PROTONIX ) 40 MG tablet [id number]  Take 1 tablet (40 mg total) by mouth daily. [name], MD  Active   sucralfate  (CARAFATE ) 1 GM/10ML suspension [id number]  Take 10 mLs (1 g total) by mouth daily.  Patient not taking: Reported on 12/23/2023   Monetta Redell PARAS, MD  Active Self  sulfamethoxazole -trimethoprim  (BACTRIM  DS) 800-160 MG tablet [id number]  Take 1 tablet by mouth 2 (two) times daily.  Patient not taking: Reported on 12/23/2023   [name], MD  Active   umeclidinium-vilanterol (ANORO ELLIPTA ) 62.5-25 MCG/ACT AEPB [id number]  Inhale 1 puff into the lungs daily at 6 (six) AM. [name], MD  Active             Recommendation:   Continue  Current Plan of Care  Follow Up Plan:   Telephone follow-up in 1 month  Rolin Kerns, LCSW [facility]  [facility], [facility] Clinical Social Worker Direct Dial: [phone]  Fax: [phone] Website: [email or web address] 10:56 AM

## 2023-12-29 NOTE — Patient Instructions (Signed)
 Visit Information  Thank you for taking time to visit with me today. Please don't hesitate to contact me if I can be of assistance to you before our next scheduled appointment.  Your next care management appointment is by telephone on 08/18 at 11 AM  Please call the care guide team at [phone] if you need to cancel, schedule, or reschedule an appointment.   Please call the Suicide and Crisis Lifeline: 988 go to [facility] Urgent [facility] [address] [place] [id number]) call 911 if you are experiencing a Mental Health or Behavioral Health Crisis or need someone to talk to.  Rolin Kerns, LCSW [place]  [facility], [facility] Clinical Social Worker Direct Dial: [phone]  Fax: [phone] Website: [email or web address] 10:47 AM

## 2024-01-07 ENCOUNTER — Other Ambulatory Visit: Payer: Self-pay

## 2024-01-07 DIAGNOSIS — G214 Vascular parkinsonism: Secondary | ICD-10-CM

## 2024-01-07 DIAGNOSIS — J441 Chronic obstructive pulmonary disease with (acute) exacerbation: Secondary | ICD-10-CM

## 2024-01-07 DIAGNOSIS — G4733 Obstructive sleep apnea (adult) (pediatric): Secondary | ICD-10-CM

## 2024-01-07 NOTE — Patient Outreach (Signed)
 Complex Care Management   Visit Note  01/07/2024  Name:  [name] MRN: [id number] DOB: 01/02/1946  Situation: Referral received for Complex Care Management related to Vascular Parkinsonism, bradykinesia, pseudobulbar affect COPD, chronic Asthma, OSA w/CPAP use, CAD. I obtained verbal consent from Patient.  Visit completed with patient on the phone.  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 Blanca   Chronic asthma 05/17/2011   Chronic low back pain with sciatica 11/14/2021   Chronic pain 11/21/2016   COPD (chronic obstructive pulmonary disease)    Dizziness 12/24/2021   Early satiety 09/11/2021   Erectile dysfunction    Esophageal motility disorder 09/11/2021   Generalized anxiety disorder    GERD (gastroesophageal reflux disease)    History of benign positional vertigo    History of colonic polyps 09/11/2021   History of kidney stones    Hoarseness 09/11/2021   Hyperlipidemia    Hypertension    Hypogonadism male    prior use on Testim    Insomnia    Iron deficiency 09/11/2021   Left ventricular dysfunction 10/12/2018   Leg pain, left 11/14/2021   Lumbar disc disease    Major depressive disorder    Mild concentric left ventricular hypertrophy (LVH)    Muscle spasm 11/14/2021   Old inferior myocardial infarction 1995   OSA (obstructive sleep apnea) 11/12/2022   NPSG 10/21/22- AHI 28.7/ hr, desat to 89%/Mean 93%,  body weight 165 lbs     Pain in both feet 08/10/2021   Peripheral vascular disease 12/25/2021   Pneumonia    Presbycusis of both ears 04/23/2018   Shortness of breath dyspnea    Slow transit constipation 09/11/2021   Spinal cord stimulator status 11/14/2021   Spondylolisthesis of lumbar region 02/28/2015   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    Assessment: Patient Reported Symptoms:  Cognitive Cognitive Status: Alert and oriented to person, place, and time, Struggling with memory recall Cognitive/Intellectual Conditions Management [RPT]: None reported or documented in medical history or problem list   Health Maintenance Behaviors: Annual physical exam, Stress management Health Facilitated by: Stress management  Neurological Neurological Review of Symptoms: Other: [place] Neurological Symptoms/Conditions [RPT]: Vascular parkinsonism; tremors; Bradykinesia Neurological Management Strategies: Medication therapy, Routine screening Neurological Self-Management Outcome: 4 (good)  HEENT HEENT Symptoms Reported: No symptoms reported      Cardiovascular Cardiovascular Symptoms Reported: No symptoms reported    Respiratory Respiratory Symptoms Reported: No symptoms reported Respiratory Management Strategies: CPAP, Medication therapy, Routine screening  Endocrine Endocrine Symptoms Reported: No symptoms reported    Gastrointestinal Gastrointestinal Symptoms Reported: Not assessed      Genitourinary Genitourinary Symptoms Reported: Not assessed    Integumentary Integumentary Symptoms Reported: Not assessed    Musculoskeletal Musculoskelatal Symptoms Reviewed: Limited mobility, Difficulty walking, Unsteady gait Musculoskeletal Management Strategies: Medical device, Routine screening Musculoskeletal Self-Management Outcome: 3 (uncertain) Falls in the past year?: Yes Number of  falls in past year: 2 or more Was there an injury with Fall?: No Fall Risk Category Calculator: 2 Patient Fall Risk  Level: Moderate Fall Risk Patient at Risk for Falls Due to: History of fall(s), Impaired balance/gait, Impaired mobility Fall risk Follow up: Falls evaluation completed, Falls prevention discussed, Education provided  Psychosocial Psychosocial Symptoms Reported: Other Other Psychosocial Conditions: Pseudobulbar affect Behavioral Management Strategies: Complementary therapy(ies), Support system, Medication therapy Behavioral Health Self-Management Outcome: 4 (good) Major Change/Loss/Stressor/Fears (CP): Medical condition, self Techniques to Cope with Loss/Stress/Change: Diversional activities (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  Medications Reviewed Today     Reviewed by Morgan Clayborne CROME, RN (Registered Nurse) on 01/07/24 at 1257  Med List Status: <None>   Medication Order Taking? Sig Documenting Provider Last Dose Status Informant  albuterol  (PROAIR  HFA) 108 (90 Base) MCG/ACT inhaler [id number]  Inhale 2 puffs into the lungs every 6 (six) hours as needed for wheezing or shortness of breath. [name], MD  Active   ALPRAZolam  (XANAX ) 0.25 MG tablet [id number]  TAKE 1 TABLET BY MOUTH 2 TIMES DAILY AS NEEDED FOR ANXIETY. [name], MD  Active   atorvastatin  (LIPITOR ) 20 MG tablet [id number]  Take 1 tablet (20 mg total) by mouth daily. [name], MD  Expired 01/01/24 2359   azelastine  (ASTELIN ) 0.1 % nasal spray [id number]  Place 2 sprays into both nostrils 2 (two) times daily. Use in each nostril as directed [name], MD  Active   buPROPion  (WELLBUTRIN  XL) 300 MG 24 hr tablet [id number]  TAKE 1 TABLET BY MOUTH DAILY Lalonde, John C, MD  Active   calcium  carbonate (OS-CAL) 600 MG TABS tablet  [id number]  Take 600 mg by mouth daily with breakfast. [provider]  Active Self  cetirizine (ZYRTEC) 10 MG tablet [id number]  Take 10 mg by mouth daily. [provider]  Active Self  clopidogrel  (PLAVIX ) 75 MG tablet [id number]  Take 1 tablet (75 mg total) by mouth daily. Monetta Redell PARAS, MD  Active   desvenlafaxine  (PRISTIQ ) 50 MG 24 hr tablet [id number]  Take 1 tablet (50 mg total) by mouth daily. [name], MD  Active   diltiazem  (CARDIZEM  CD) 240 MG 24 hr capsule [id number]  Take 1 capsule (240 mg total) by mouth every other day. Monetta Redell PARAS, MD  Active   ferrous sulfate 325 (65 FE) MG tablet [id number]  Take 325 mg by mouth daily. [provider]  Active Self           Med Note ABE ZANE CHRISTELLA [place] Feb 27, 2023  8:49 AM)    Flaxseed, Linseed, (FLAX SEED OIL) 1000 MG CAPS [id number]  Take 1,000 mg by mouth every 7 (seven) days. [provider]  Active Self  GLUCOSAMINE CHONDROITIN COMPLX PO [id number]  Take 1 tablet by mouth daily. [provider]  Active Self  guaiFENesin (MUCINEX) 600 MG 12 hr tablet [id number]  Take 600 mg by mouth 2 (two) times daily as needed for cough. [provider]  Active Self  meclizine  (ANTIVERT ) 25 MG tablet [id number]  TAKE 1 TWICE A DAY AS NEEDED FOR DIZZINESS  [name], MD  Active   methylphenidate  (CONCERTA ) 54 MG PO CR tablet [id number]  Take 1 tablet (54 mg total) by mouth every morning. Lalonde, John C, MD  Active   Multiple Vitamins-Minerals (MULTIVITAMIN WITH MINERALS) tablet [id number]  Take 1 tablet by mouth daily. [provider]  Active Self  pantoprazole  (PROTONIX ) 40 MG tablet [id number]  Take 1 tablet (40 mg total) by mouth daily. [name], MD  Active   sucralfate  (CARAFATE ) 1 GM/10ML suspension [id number]  Take 10 mLs (1 g total) by mouth daily.  Patient not taking: Reported on 12/23/2023   Monetta Redell PARAS, MD  Active Self  sulfamethoxazole -trimethoprim  (BACTRIM  DS)  800-160 MG tablet [id number]  Take 1 tablet by mouth 2 (two) times daily.  Patient not taking: Reported on 12/23/2023   [name], MD  Active   umeclidinium-vilanterol (ANORO ELLIPTA ) 62.5-25 MCG/ACT ROBBIE [id number]  Inhale 1 puff into the lungs daily at 6 (six) AM. [name], MD  Active             Recommendation:   PCP Follow-up with Dr. Joyce on Wednesday, August 6 at 10:00 AM  Follow Up Plan:   Telephone follow up appointment date/time:  Friday, August 8 at 11:00 AM Pharmacy Referral sent to assist with compliance packaging  Clayborne Ly RN BSN CCM [facility]  [facility], [facility] Nurse Care Coordinator  Direct Dial: [phone] Website: [email or web address]

## 2024-01-09 NOTE — Patient Instructions (Signed)
 Visit Information  Thank you for taking time to visit with me today. Please don't hesitate to contact me if I can be of assistance to you before our next scheduled appointment.  Our next appointment is by telephone on Friday, 01/16/24 at 11:00 AM Please call the care guide team at [phone] if you need to cancel or reschedule your appointment.   Following is a copy of your care plan:   Goals Addressed             This Visit's Progress    VBCI RN Care Plan related to OSA w/CPAP and COPD       Problems:  Chronic Disease Management support and education needs related to COPD, OSA w/CPAP  Goal: Over the next 90 days the Patient will continue to work with RN Care Manager and/or Social Worker to address care management and care coordination needs related to COPD, OSA w/CPAP as evidenced by adherence to care management team scheduled appointments      Interventions:   COPD Interventions: Advised patient to engage in light exercise as tolerated 3-5 days a week to aid in the the management of COPD Advised patient to track and manage COPD triggers Discussed the importance of adequate rest and management of fatigue with COPD Provided instruction about proper use of medications used for management of COPD including inhalers Provided written and verbal instructions on pursed lip breathing and utilized returned demonstration as teach back Assessed for adherence with use of CPAP and patient reports compliance, but admits he will often remove the face mask unknowingly while sleeping Educated patient re: nasal pillow option and patient states a planned discussion with his Pulmonologist is scheduled  Discussed plans with patient for ongoing nurse care management follow up and provided patient with direct contact information for nurse case management   Patient Self-Care Activities:  Attend all scheduled provider appointments Call pharmacy for medication refills 3-7 days in advance of running out of  medications Call provider office for new concerns or questions  Take medications as prescribed   do breathing exercises every day begin a symptom diary develop a rescue plan eliminate symptom triggers at home follow rescue plan if symptoms flare-up    Plan:  Next PCP appointment scheduled for: Wednesday, August 6 at 10:00 AM Telephone follow up appointment with care management team member scheduled for:  Friday, August 8 at 11:00 AM [facility] will follow up with the patient and will provide direct communication to the PCP for this patient regarding compliance packaging            VBCI RN 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       Problems:  Chronic Disease Management support and education needs related to 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 Evonnie, Neurologist with [facility] Neurology Review of patient status, including review of consultant's reports, relevant laboratory and other test results, and medication reconciliation completed with  no discrepancies noted  Assessment/Plan:   1.  Vascular Parkinsonism             - This is evidenced by the fact that the patient looks pretty good clinically when sitting down but looks more parkinsonian when ambulating, with short shuffling steps.  He is unable to have an MRI because of spinal cord stimulator, so we are unable to assess  degree of small vessel disease.  He did have a CT brain in April, 2024.             -we could do DaT scan/skin biopsy .  DaT scans can be positive in vascular Parkinsons Disease but generally skin biopsies will only be positive if idiopathic (or atypical).  Discussed risk, benefits, side effects.  He would like to get skin biopsy scheduled.  I will try to get insurance approval.             -discussed exercise classes             -he is using his walker and I congratulated him for that             -discussed importance of exercising. 2.  Chronic vertigo             -This has been going on for many years             -on meclizine  3.  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 Gayland in April, 2025.  He mentioned medications as a role in diminishing cognition.  Determined patient verbalizes understanding of his prescribed treatment plan  Falls assessment completed  Provided verbal education re: potential causes of falls and Fall prevention strategies Reviewed medications and discussed potential side effects of medications such as dizziness and frequent urination Advised patient of importance of notifying provider of falls Assessed for signs and symptoms of orthostatic hypotension Assessed social determinant of health barriers Discussed plans with patient for ongoing care management follow up and provided patient with direct contact information for care management team   Patient Self-Care Activities:  Attend all scheduled provider appointments Call pharmacy for medication refills 3-7 days in advance of running out of medications Call provider office for new concerns or questions  Take medications as prescribed   Take your time when changing positions and use your walker at all times when ambulating, do not walk on uneven ground and wear good supportive shoes, keep all walkways in your home clutter free  and use a nightlight if needed during the nighttime Work with the nurse care manager to address care coordination needs and will continue to work with the clinical team to address health care and disease management related needs  Plan:  Next PCP appointment scheduled for: Wednesday, August 6 at 10:00 AM Telephone follow up appointment with care management team member scheduled for:  Friday, August 8 at 11:00 AM Next Neurology appointment with Camie Sevin NP with [facility] Neurology is scheduled for: 02/27/24 at 11:30 AM [facility] will follow up with the patient and will provide direct communication to the PCP for this patient regarding compliance packaging               Please call 1-800-273-TALK (toll free, 24 hour hotline) if you are experiencing a Mental Health  or Behavioral Health Crisis or need someone to talk to.  Patient verbalizes understanding of instructions and care plan provided today and agrees to view in MyChart. Active MyChart status and patient understanding of how to access instructions and care plan via MyChart confirmed with patient.     Clayborne Ly RN BSN CCM [place]  [facility], [facility] Nurse Care Coordinator  Direct Dial: [phone] Website: [email or web address]

## 2024-01-12 ENCOUNTER — Telehealth: Payer: Self-pay

## 2024-01-12 NOTE — Progress Notes (Unsigned)
 Care Guide Pharmacy Note  01/12/2024 Name: JAHKAI YANDELL MRN: [id number] DOB: [birth year]  Referred By: [name], MD Reason for referral: Complex Care Management and Call Attempt #1 (Unsuccessful initial outreach to schedule with PHARM D- Jon DEL)   RYOTT RAFFERTY is a 78 y.o. year old male who is a primary care patient of [name], MD.  Merilee LELON Ku was referred to the pharmacist for assistance related to: medication adherence  An unsuccessful telephone outreach was attempted today to contact the patient who was referred to the pharmacy team for assistance with medication adhereence. Additional attempts will be made to contact the patient.  Leotis Rase [facility], [facility] Guide  Direct Dial: [phone]  Fax [phone]

## 2024-01-14 ENCOUNTER — Ambulatory Visit: Payer: Medicare Other | Admitting: Family Medicine

## 2024-01-14 ENCOUNTER — Encounter: Payer: Self-pay | Admitting: Family Medicine

## 2024-01-14 VITALS — BP 104/68 | HR 83 | Ht 65.0 in | Wt 160.8 lb

## 2024-01-14 DIAGNOSIS — I2510 Atherosclerotic heart disease of native coronary artery without angina pectoris: Secondary | ICD-10-CM | POA: Diagnosis not present

## 2024-01-14 DIAGNOSIS — Z23 Encounter for immunization: Secondary | ICD-10-CM | POA: Diagnosis not present

## 2024-01-14 DIAGNOSIS — F320 Major depressive disorder, single episode, mild: Secondary | ICD-10-CM

## 2024-01-14 DIAGNOSIS — K224 Dyskinesia of esophagus: Secondary | ICD-10-CM

## 2024-01-14 DIAGNOSIS — F901 Attention-deficit hyperactivity disorder, predominantly hyperactive type: Secondary | ICD-10-CM

## 2024-01-14 DIAGNOSIS — G214 Vascular parkinsonism: Secondary | ICD-10-CM

## 2024-01-14 DIAGNOSIS — J4520 Mild intermittent asthma, uncomplicated: Secondary | ICD-10-CM

## 2024-01-14 DIAGNOSIS — K219 Gastro-esophageal reflux disease without esophagitis: Secondary | ICD-10-CM

## 2024-01-14 DIAGNOSIS — G20A1 Parkinson's disease without dyskinesia, without mention of fluctuations: Secondary | ICD-10-CM

## 2024-01-14 DIAGNOSIS — I10 Essential (primary) hypertension: Secondary | ICD-10-CM

## 2024-01-14 DIAGNOSIS — M459 Ankylosing spondylitis of unspecified sites in spine: Secondary | ICD-10-CM

## 2024-01-14 DIAGNOSIS — E782 Mixed hyperlipidemia: Secondary | ICD-10-CM

## 2024-01-14 DIAGNOSIS — J4530 Mild persistent asthma, uncomplicated: Secondary | ICD-10-CM

## 2024-01-14 DIAGNOSIS — Z0000 Encounter for general adult medical examination without abnormal findings: Secondary | ICD-10-CM

## 2024-01-14 DIAGNOSIS — Z9689 Presence of other specified functional implants: Secondary | ICD-10-CM

## 2024-01-14 DIAGNOSIS — F411 Generalized anxiety disorder: Secondary | ICD-10-CM

## 2024-01-14 DIAGNOSIS — G894 Chronic pain syndrome: Secondary | ICD-10-CM

## 2024-01-14 DIAGNOSIS — I517 Cardiomegaly: Secondary | ICD-10-CM

## 2024-01-14 DIAGNOSIS — R41840 Attention and concentration deficit: Secondary | ICD-10-CM

## 2024-01-14 DIAGNOSIS — J301 Allergic rhinitis due to pollen: Secondary | ICD-10-CM

## 2024-01-14 DIAGNOSIS — I700 Atherosclerosis of aorta: Secondary | ICD-10-CM

## 2024-01-14 DIAGNOSIS — F909 Attention-deficit hyperactivity disorder, unspecified type: Secondary | ICD-10-CM

## 2024-01-14 DIAGNOSIS — M1990 Unspecified osteoarthritis, unspecified site: Secondary | ICD-10-CM

## 2024-01-14 DIAGNOSIS — J45909 Unspecified asthma, uncomplicated: Secondary | ICD-10-CM

## 2024-01-14 DIAGNOSIS — G4733 Obstructive sleep apnea (adult) (pediatric): Secondary | ICD-10-CM

## 2024-01-14 DIAGNOSIS — G3184 Mild cognitive impairment, so stated: Secondary | ICD-10-CM

## 2024-01-14 DIAGNOSIS — H9113 Presbycusis, bilateral: Secondary | ICD-10-CM

## 2024-01-14 MED ORDER — PANTOPRAZOLE SODIUM 40 MG PO TBEC: 40.0000 mg | DELAYED_RELEASE_TABLET | Freq: Every day | ORAL | 3 refills | Status: AC

## 2024-01-14 MED ORDER — METHYLPHENIDATE HCL ER (OSM) 54 MG PO TBCR: 54.0000 mg | EXTENDED_RELEASE_TABLET | ORAL | 0 refills | Status: AC

## 2024-01-14 MED ORDER — UMECLIDINIUM-VILANTEROL 62.5-25 MCG/ACT IN AEPB: 1.0000 | INHALATION_SPRAY | Freq: Every day | RESPIRATORY_TRACT | 3 refills | Status: AC

## 2024-01-14 MED ORDER — ATORVASTATIN CALCIUM 20 MG PO TABS: 20.0000 mg | ORAL_TABLET | Freq: Every day | ORAL | 3 refills | Status: AC

## 2024-01-14 MED ORDER — AZELASTINE HCL 0.1 % NA SOLN
2.0000 | Freq: Two times a day (BID) | NASAL | 3 refills | Status: AC
Start: 2024-01-14 — End: ?

## 2024-01-14 MED ORDER — ALBUTEROL SULFATE HFA 108 (90 BASE) MCG/ACT IN AERS: 2.0000 | INHALATION_SPRAY | Freq: Four times a day (QID) | RESPIRATORY_TRACT | 2 refills | Status: AC | PRN

## 2024-01-14 NOTE — Progress Notes (Signed)
 Care Guide Pharmacy Note  01/14/2024 Name: [name] MRN: [id number] DOB: 01-03-1946  Referred By: [name], MD Reason for referral: Complex Care Management, Call Attempt #1 (Unsuccessful initial outreach to schedule with PHARM D- [name]), and Call Attempt #2 (Successful outreach scheduled with PHARM D- Dwayne)   [name] is a 78 y.o. year old male who is a primary care patient of [name], MD.  [name] was referred to the pharmacist for assistance related to: medication adherence.  Successful contact was made with the patient to discuss pharmacy services including being ready for the pharmacist to call at least 5 minutes before the scheduled appointment time and to have medication bottles and any blood pressure readings ready for review. The patient agreed to meet with the pharmacist via telephone visit on (date/time). 02/05/24 @ 11 AM.   [name]  [facility], [facility] Guide  Direct Dial: [phone]  Fax [phone]

## 2024-01-14 NOTE — Progress Notes (Signed)
 Subjective:    Patient ID: [name], male    DOB: 1945/12/08, 78 y.o.   MRN: [id number]  Discussed the use of AI scribe software for clinical note transcription with the patient, who gave verbal consent to proceed.  History of Present Illness   Jorge Retz is a 78 year old male with Parkinson's disease who presents with difficulty swallowing and concerns about disease progression.  He has been experiencing difficulty swallowing for nearly a year, which has progressively worsened. He describes the sensation as if something is 'getting caught on a hook' or feeling like a 'rock' in his throat, particularly when yawning or taking large bites. This occurs daily and is associated with a sensation of a 'big knot' in his throat.  He is concerned about the progression of Parkinson's disease and has questions about its course, particularly regarding swallowing difficulties. He is not currently on any specific medication for Parkinson's disease but has an upcoming appointment with his neurologist in a week.  He has a history of coronary artery disease and atherosclerosis of the aorta, for which he takes atorvastatin  and Cardizem . He mentions receiving a letter from his cardiologist for a general check-up.  He has a history of reflux disease but reports no recent reflux symptoms. He is taking Protonix  for this condition. He also has seasonal allergies, which are currently not problematic, and uses Astelin  for management.  He has ankylosing spondylitis and arthritis, contributing to back pain. He uses a spinal cord stimulator for pain management, which provides some relief. He also takes Concerta  for focus and bupropion  for psychological well-being.  He has sleep apnea and uses a CPAP machine, although he sometimes finds it off in the morning. He reports not getting enough restful sleep.  He experiences bilateral foot pain, describing his toes as overlapping and sometimes  appearing red. He has seen a podiatrist briefly, who mentioned the possibility of surgery.  He reports periods of depression and occasional crying spells, which he finds embarrassing in public settings. He no longer drives, which he dislikes.  He uses hearing aids, which he sometimes forgets to wear, and expresses concerns about privacy and data sharing.           Review of Systems  All other systems reviewed and are negative.      Objective:    Physical Exam Alert and in no distress. Tympanic membranes and canals are normal. Pharyngeal area is normal. Neck is supple without adenopathy or thyromegaly. Cardiac exam shows a regular sinus rhythm without murmurs or gallops. Lungs are clear to auscultation.  Foot exam shows normal anatomy as well as skin.          Assessment & Plan:  Assessment and Plan    Dysphagia due to esophageal dyskinesia Likely related to esophageal dyskinesia, possibly exacerbated by Parkinson's disease. - Refer to gastroenterologist for further evaluation.  Parkinson's disease Diagnosed by neurologist. Discussed variable progression and symptom monitoring. - Provide educational materials.  Coronary artery disease and atherosclerosis of aorta Managed with atorvastatin . Emphasized importance of statin therapy. - Continue atorvastatin . - Check cholesterol levels.  Gastroesophageal reflux disease Managed with Protonix  as needed. - Continue Protonix  as needed.  Ankylosing spondylitis and chronic back pain Pain partially managed with spinal cord stimulator.  Obstructive sleep apnea Managed with CPAP. Recent evaluation indicated insufficient restful sleep. - Continue CPAP use as advised by sleep specialist.  Seasonal allergic rhinitis and asthma Asthma managed with Anoro and albuterol . Seasonal allergies not  problematic. - Renew Astelin . - Continue Anoro and albuterol  as needed.  Depression Experiencing periods of depression. Managed with  bupropion . - Continue bupropion .  Attention deficit hyperactivity disorder Managed with Concerta , effective in maintaining focus. - Renew Concerta  prescription.  Erectile dysfunction Not responsive to Cialis  or Viagra. Discussed potential use of herbal supplements. - Consult pharmacist before trying herbal supplements.  Bilateral foot pain with minimal toe overlap Previous podiatrist suggested surgery. Advised to explore non-surgical options first. - Consult podiatrist for non-surgical management options.  Hearing loss Uses hearing aids regularly.  General Health Maintenance Discussed importance of vaccinations, including pneumonia and RSV vaccines. - Administer pneumonia vaccine. - Advise obtaining RSV vaccine from pharmacy.

## 2024-01-14 NOTE — Patient Instructions (Signed)
 Parkinson's Disease Parkinson's disease causes problems with movement. It makes it harder for you to walk or control your body. Each person with the disease is affected differently. Treatments can help you manage your symptoms. Parkinson's disease can range from mild to very bad, but it gets worse over time. This often happens slowly over many years. What are the causes? Parkinson's disease is caused by a loss of brain cells called neurons. These brain cells make a substance called dopamine, which is needed to control body movement. It's not known what causes the brain cells to die. What increases the risk? Being male. Being 78 years of age or older. Having a family history of Parkinson's disease. Having an injury to the brain in the past. Being around things that are harmful or poisonous, such as pesticides. Having depression. This is when you feel sad or hopeless. What are the signs or symptoms? Symptoms can vary and get worse over time. The main symptoms can be seen in your movement. These include: Shaking or tremors that you can't control. This happens while you're resting. Stiffness in your arms and legs. Losing facial expressions. Walking in a way that isn't normal. You may walk with short, shuffling steps. Loss of balance when standing. You may sway, fall backward, or have trouble making turns. Other symptoms include: Being very sad, worried, or nervous. Having delusions. These are strong beliefs that aren't true. Having hallucinations. This is when you see, hear, taste, smell, or feel things that aren't real. Trouble speaking or swallowing. Trouble pooping (constipation). Needing to pee (urinate) right away, peeing often, or losing control of when you pee or poop. Sleep problems. How is this diagnosed? A diagnosis may be made based on symptoms, your medical history, and a physical exam. You may also have imaging tests that make pictures of your brain. How is this treated? There  is no cure for Parkinson's disease. The goal of treatment is to manage your symptoms. It may include: Medicines. Therapy to help with talking or movement. Surgery to reduce shaking and other movements that you can't control. Follow these instructions at home: Medicines Take your medicines only as told by your health care provider. Avoid taking pain or sleeping medicines. These can affect your thinking. Activity Ask your provider if it's safe for you to drive. Exercise as told by your provider or physical therapist. Lifestyle  Put grab bars and railings in your home, especially near the toilet and in the shower. These help prevent falls. Do not smoke, vape, or use products with nicotine or tobacco in them. If you need help quitting, talk with your provider. Do not drink alcohol. General instructions Talk with your provider to find out what kind of help you need at home. Ask about ways to stay safe. Join a support group for people with Parkinson's disease. Where to find more information [employer] of Neurological Disorders and Stroke: [employer] Parkinson's Foundation: parkinson.org Contact a health care provider if: Medicines don't help your symptoms. You have a lot of side effects from your medicines. You keep losing your balance or you fall. You need more help at home. You have trouble swallowing. You have a very hard time pooping. You feel very sad, worried, or confused. You see, hear, taste, smell, or feel things that aren't real. Get help right away if: You were hurt in a fall. You can't swallow without choking. You have chest pain or trouble breathing. You don't feel safe at home. These symptoms may be an emergency.  Call 911 right away. Do not wait to see if the symptoms will go away. Do not drive yourself to the hospital. Also, get help right away if: You feel like you may hurt yourself or others. You have thoughts about taking your own life. Take one of  these steps: Go to your nearest emergency room. Call 911. Call the National Suicide Prevention Lifeline at 919-766-2411 or 988. Text the Crisis Text Line at 4324995006. This information is not intended to replace advice given to you by your health care provider. Make sure you discuss any questions you have with your health care provider. Document Revised: 02/27/2023 Document Reviewed: 08/12/2022 Elsevier Patient Education  2024 [employer].

## 2024-01-14 NOTE — Progress Notes (Signed)
 [name] is a 78 y.o. male who presents for annual wellness visit and follow-up on chronic medical conditions.  He has the following concerns:   Immunizations and Health Maintenance Immunization History  Administered Date(s) Administered   Fluad Quad(high Dose 65+) 04/14/2020, 03/13/2021, 05/15/2022   Fluad Trivalent(High Dose 65+) 02/27/2023   Influenza Split 04/23/2011   Influenza Whole 03/20/2009, 03/19/2010   Influenza, High Dose Seasonal PF 04/12/2016, 02/26/2017, 04/02/2018, 01/30/2019, 01/30/2019   Influenza,inj,Quad PF,6+ Mos 03/05/2013, 02/23/2014, 04/21/2015   Influenza-Unspecified 04/02/2018, 01/30/2019   PFIZER Comirnaty(Gray Top)Covid-19 Tri-Sucrose Vaccine 06/22/2020   PFIZER(Purple Top)SARS-COV-2 Vaccination 07/02/2019, 07/23/2019   Pfizer Covid-19 Vaccine Bivalent Booster 45yrs & up 03/13/2021   Pfizer(Comirnaty)Fall Seasonal Vaccine 12 years and older 05/15/2022, 02/27/2023   Pneumococcal Conjugate-13 03/04/2014   Pneumococcal Polysaccharide-23 06/10/2005, 05/19/2012   Td 10/20/2006   Tdap 10/20/2006, 02/26/2017   Zoster Recombinant(Shingrix) 12/10/2016, 07/15/2017   Zoster, Live 01/01/2007   Health Maintenance Due  Topic Date Due   COVID-19 Vaccine (7 - 2024-25 season) 08/27/2023   INFLUENZA VACCINE  01/09/2024    Last colonoscopy:01/26/2021 Dr Yevonne Last PSA:  Dentist: [facility] 2 weeks ago Ophtho: [facility] 8 months ago Exercise: limited chair exercise such as leg lifts  Other doctors caring for patient include:  Advanced Directives:    Depression screen:  See questionnaire below.        01/07/2024   12:57 PM 12/23/2023    9:42 AM 12/17/2022    9:38 AM 05/15/2022    9:12 AM 12/07/2021   11:37 AM  Depression screen PHQ 2/9  Decreased Interest 0 0 0 0 0  Down, Depressed, Hopeless 0 0 0 1 0  PHQ - 2 Score 0 0 0 1 0  Altered sleeping  0 0 0 0  Tired, decreased energy  1 0 1 1  Change in appetite  0 0 0 0  Feeling bad or failure  about yourself   0 0 0 0  Trouble concentrating  0 0 1 0  Moving slowly or fidgety/restless  0 0 0 0  Suicidal thoughts  0 0 0 0  PHQ-9 Score  1 0 3 1  Difficult doing work/chores  Not difficult at all Not difficult at all Not difficult at all Not difficult at all    Fall Screen: See Questionaire below.      01/07/2024    1:03 PM 12/29/2023   10:43 AM 12/23/2023    9:41 AM 12/22/2023   11:05 AM 11/18/2023    1:09 PM  Fall Risk   Falls in the past year? 1 1 0 1 0  Number falls in past yr: 1 0 0 0 0  Injury with Fall? 0 0 0 0 0  Risk for fall due to : History of fall(s);Impaired balance/gait;Impaired mobility  Medication side effect;Impaired mobility;Impaired balance/gait    Follow up Falls evaluation completed;Falls prevention discussed;Education provided  Falls evaluation completed;Falls prevention discussed  Falls evaluation completed    ADL screen:  See questionnaire below.  Functional Status Survey:     Review of Systems  Constitutional: -, -unexpected weight change, -anorexia, -fatigue Allergy : -sneezing, -itching, -congestion Dermatology: denies changing moles, rash, lumps ENT: -runny nose, -ear pain, -sore throat,  Cardiology:  -chest pain, -palpitations, -orthopnea, Respiratory: -cough, -shortness of breath, -dyspnea on exertion, -wheezing,  Gastroenterology: -abdominal pain, -nausea, -vomiting, -diarrhea, -constipation, -dysphagia Hematology: -bleeding or bruising problems Musculoskeletal: -arthralgias, -myalgias, -joint swelling, -back pain, - Ophthalmology: -vision changes,  Urology: -dysuria, -difficulty urinating,  -  urinary frequency, -urgency, incontinence Neurology: -, -numbness, , -memory loss, -falls, -dizziness    PHYSICAL EXAM:  There were no vitals taken for this visit.  General Appearance: Alert, cooperative, no distress, appears stated age Head: Normocephalic, without obvious abnormality, atraumatic Eyes: PERRL, conjunctiva/corneas clear, EOM's  intact, fundi benign Ears: Normal TM's and external ear canals Nose: Nares normal, mucosa normal, no drainage or sinus   tenderness Throat: Lips, mucosa, and tongue normal; teeth and gums normal Neck: Supple, no lymphadenopathy, thyroid :no enlargement/tenderness/nodules; no carotid bruit or JVD Lungs: Clear to auscultation bilaterally without wheezes, rales or ronchi; respirations unlabored Heart: Regular rate and rhythm, S1 and S2 normal, no murmur, rub or gallop Abdomen: Soft, non-tender, nondistended, normoactive bowel sounds, no masses, no hepatosplenomegaly Extremities: No clubbing, cyanosis or edema Pulses: 2+ and symmetric all extremities Skin: Skin color, texture, turgor normal, no rashes or lesions Lymph nodes: Cervical, supraclavicular, and axillary nodes normal Neurologic: CNII-XII intact, normal strength, sensation and gait; reflexes 2+ and symmetric throughout   Psych: Normal mood, affect, hygiene and grooming  ASSESSMENT/PLAN: Routine general medical examination at a health care facility  ADHD, predominantly hyperactive type  Seasonal allergic rhinitis due to pollen  Arthritis  Ankylosing spondylitis, unspecified site of spine (HCC)  Attention or concentration deficit [R41.840]  Coronary artery disease involving native coronary artery of native heart without angina pectoris  Mild intermittent chronic asthma without complication  Generalized anxiety disorder  Esophageal motility disorder  Gastroesophageal reflux disease without esophagitis  Chronic pain syndrome  Current mild episode of major depressive disorder without prior episode (HCC)  MCI (mild cognitive impairment)  OSA (obstructive sleep apnea)  Spinal cord stimulator status  Vascular parkinsonism  Presbycusis of both ears  Mild concentric left ventricular hypertrophy (LVH)  Mixed hyperlipidemia  Primary hypertension  Atherosclerosis of aorta  Mild persistent chronic asthma without  complication  Mild intermittent asthma without complication  Chronic asthma without complication, unspecified asthma severity, unspecified whether persistent  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 Immunization recommendations discussed.  Colonoscopy recommendations reviewed.   Medicare Attestation I have personally reviewed: The patient's medical and social history Their use of alcohol, tobacco or illicit drugs Their current medications and supplements The patient's functional ability including ADLs,fall risks, home safety risks, cognitive, and hearing and visual impairment Diet and physical activities Evidence for depression or mood disorders  The patient's weight, height, and BMI have been recorded in the chart.  I have made referrals, counseling, and provided education to the patient based on review of the above and I have provided the patient with a written personalized care plan for preventive services.     Norleen Jobs, MD   01/14/2024

## 2024-01-15 ENCOUNTER — Telehealth: Payer: Self-pay | Admitting: Neurology

## 2024-01-15 ENCOUNTER — Ambulatory Visit: Payer: Self-pay | Admitting: Family Medicine

## 2024-01-15 ENCOUNTER — Ambulatory Visit (INDEPENDENT_AMBULATORY_CARE_PROVIDER_SITE_OTHER): Admitting: Neurology

## 2024-01-15 VITALS — BP 126/72 | HR 68

## 2024-01-15 DIAGNOSIS — G20A1 Parkinson's disease without dyskinesia, without mention of fluctuations: Secondary | ICD-10-CM

## 2024-01-15 DIAGNOSIS — G3184 Mild cognitive impairment, so stated: Secondary | ICD-10-CM

## 2024-01-15 LAB — LIPID PANEL
Chol/HDL Ratio: 3.8 ratio (ref 0.0–5.0)
Cholesterol, Total: 136 mg/dL (ref 100–199)
HDL: 36 mg/dL — ABNORMAL LOW (ref >39)
LDL Chol Calc (NIH): 82 mg/dL (ref 0–99)
Triglycerides: 94 mg/dL (ref 0–149)
VLDL Cholesterol Cal: 18 mg/dL (ref 5–40)

## 2024-01-15 MED ORDER — CARBIDOPA-LEVODOPA 25-100 MG PO TABS: 1.0000 | ORAL_TABLET | Freq: Three times a day (TID) | ORAL | Status: DC

## 2024-01-15 NOTE — Progress Notes (Signed)
 Assessment/Plan:   1.  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  -we previously gave him levodopa , but he only took it twice per day, with the last 1 being at bedtime, so this 1 was likely not effective at all.  He and I discussed that we should probably try this medication again, and take it 3 times per day, with dosing being at 8 AM/noon/4 PM.  He was given a titration schedule.  If we do not find that effective, we may just increase the dose before doing a levodopa  challenge test.  -discussed exercise and ways to increase exercise  -invited to Parkinsons Disease symposium  -unable to have MRI brain   2.  Chronic vertigo             -This has been going on for many years             -on meclizine    3.  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 Gayland in April, 2025.  He mentioned medications as a role in diminishing cognition.  Subjective:   [name] was seen today in follow up for testing results. This patient is accompanied in the office by his spouse who supplements the history.  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  We previously gave him levodopa  about a year ago, but he only took it twice per day, with the last 1 being at bedtime.  He is not on that now.  He has not had falls since last visit.    Prior medications: Levodopa  (was only taking twice daily and was not necessarily helping)   ALLERGIES:   Allergies  Allergen Reactions   Oxycodone  Nausea And Vomiting and Other (See Comments)    CURRENT MEDICATIONS:  Current Meds  Medication Sig   albuterol  (PROAIR  HFA) 108 (90 Base) MCG/ACT inhaler Inhale 2 puffs into the lungs every 6 (six) hours as needed for wheezing or shortness of breath.    ALPRAZolam  (XANAX ) 0.25 MG tablet TAKE 1 TABLET BY MOUTH 2 TIMES DAILY AS NEEDED FOR ANXIETY.   atorvastatin  (LIPITOR ) 20 MG tablet Take 1 tablet (20 mg total) by mouth daily.   azelastine  (ASTELIN ) 0.1 % nasal spray Place 2 sprays into both nostrils 2 (two) times daily. Use in each nostril as directed   buPROPion  (WELLBUTRIN  XL) 300 MG 24 hr tablet TAKE 1 TABLET BY MOUTH DAILY   calcium  carbonate (OS-CAL) 600 MG TABS tablet Take 600 mg by mouth daily with breakfast.   cetirizine (ZYRTEC) 10 MG tablet Take 10 mg by mouth daily.   clopidogrel  (PLAVIX ) 75 MG tablet Take 1 tablet (75 mg total) by mouth daily.   desvenlafaxine  (PRISTIQ ) 50 MG 24 hr tablet Take 1 tablet (50 mg total) by mouth daily.   diltiazem  (CARDIZEM  CD) 240 MG 24 hr capsule Take 1 capsule (240 mg total) by mouth every other day.   ferrous sulfate 325 (65 FE) MG tablet Take 325 mg by mouth daily.   Flaxseed, Linseed, (FLAX SEED OIL) 1000 MG CAPS Take 1,000 mg by mouth every 7 (seven) days.   GLUCOSAMINE CHONDROITIN  COMPLX PO Take 1 tablet by mouth daily.   guaiFENesin (MUCINEX) 600 MG 12 hr tablet Take 600 mg by mouth 2 (two) times daily as needed for cough.   meclizine  (ANTIVERT ) 25 MG tablet TAKE 1 TWICE A DAY AS NEEDED FOR DIZZINESS   methylphenidate  (CONCERTA ) 54 MG PO CR tablet Take 1 tablet (54 mg total) by mouth every morning.   Multiple Vitamins-Minerals (MULTIVITAMIN WITH MINERALS) tablet Take 1 tablet by mouth daily.   pantoprazole  (PROTONIX ) 40 MG tablet Take 1 tablet (40 mg total) by mouth daily.   umeclidinium-vilanterol (ANORO ELLIPTA ) 62.5-25 MCG/ACT AEPB Inhale 1 puff into the lungs daily at 6 (six) AM.     Objective:   PHYSICAL EXAMINATION:    VITALS:   Vitals:   01/15/24 1056  BP: 126/72  Pulse: 68  SpO2: 98%    GEN:  The patient appears stated age and is in NAD. HEENT:  Normocephalic, atraumatic.  The mucous membranes are moist. The superficial temporal arteries are without ropiness or  tenderness. CV:  RRR Lungs:  CTAB Neck/HEME:  There are no carotid bruits bilaterally. Skin:  all 3 biopsy sites look good, well healed, no erythema or drainage  Neurological examination:  Orientation: The patient is alert and oriented x3. Cranial nerves: There is good facial symmetry without facial hypomimia. The speech is fluent and clear. Soft palate rises symmetrically and there is no tongue deviation. Hearing is intact to conversational tone.  He is wearing his hearing aids. Sensation: Sensation is intact to light touch throughout Motor: Strength is at least antigravity x4.  Movement examination: Tone: There is nl tone in the bilateral upper extremities.  The tone in the lower extremities is nl.  Abnormal movements: none even with distraction procedures Coordination:  There is no decremation with RAM's, with any form of RAMS, including alternating supination and pronation of the forearm, hand opening and closing, finger taps, heel taps and toe taps.  Gait and Station: The patient pushes off to arise.  He ambulates with his walker.  He is forward flexed and ambulates on his toes with some festination.  Does better when given upright walker  I have reviewed and interpreted the following labs independently    Chemistry      Component Value Date/Time   NA 135 08/09/2023 1414   NA 142 07/09/2022 1230   K 3.7 08/09/2023 1414   CL 106 08/09/2023 1414   CO2 23 08/09/2023 1414   BUN 15 08/09/2023 1414   BUN 11 07/09/2022 1230   CREATININE 0.93 08/09/2023 1414   CREATININE 1.07 10/15/2016 1056      Component Value Date/Time   CALCIUM  8.9 08/09/2023 1414   ALKPHOS 51 09/09/2022 2041   AST 22 09/09/2022 2041   ALT 24 09/09/2022 2041   BILITOT 0.4 09/09/2022 2041   BILITOT 0.3 07/09/2022 1230       Lab Results  Component Value Date   WBC 6.7 08/09/2023   HGB 12.0 (L) 08/09/2023   HCT 37.8 (L) 08/09/2023   MCV 87.7 08/09/2023   PLT 264 08/09/2023    Lab Results   Component Value Date   TSH 0.69 02/19/2023     Total time spent on today's visit was 34 minutes, including both face-to-face time and nonface-to-face time.  Time included that spent on review of records (prior notes available to me/labs/imaging if pertinent), discussing treatment and goals, answering patient's questions and coordinating care.  Cc:  [name], MD

## 2024-01-15 NOTE — Telephone Encounter (Signed)
 Pts skin biopsy came back.  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  No evidence of amyloid deposition within the cutaneous nerves

## 2024-01-15 NOTE — Patient Instructions (Addendum)
 Start Carbidopa  Levodopa  as follows: Take 1/2 tablet three times daily, at least 30 minutes before meals (approximately 8am/noon/4pm), for one week Then take 1/2 tablet in the morning, 1/2 tablet in the afternoon at noon, 1 tablet in the evening at 4 PM, at least 30 minutes before meals, for one week Then take 1/2 tablet in the morning, 1 tablet in the afternoon at noon, 1 tablet in the evening at 4 PM, at least 30 minutes before meals, for one week Then take 1 tablet three times daily at 8am/noon/4pm, at least 30 minutes before meals    As a reminder, carbidopa /levodopa  can be taken at the same time as a carbohydrate, but we like to have you take your pill either 30 minutes before a protein source or 1 hour after as protein can interfere with carbidopa /levodopa  absorption.   SAVE THE DATE!  We are planning a Parkinsons Disease educational symposium at [facility], [address] [place], [place], [place] [address] on September 19.  We will have a movement disorder physician expert from [employer] coming to speak and a caregiver speaker.  We will have a panel of experts that will show you who you may need on your team of people on your journey with Parkinsons.  I hope to see you there!  Use this QR code to register by scanning it with the camera app on your phone:      Need more help with registration?  Contact [email or web address]

## 2024-01-16 ENCOUNTER — Other Ambulatory Visit: Payer: Self-pay

## 2024-01-16 NOTE — Patient Instructions (Signed)
 Visit Information  Thank you for taking time to visit with me today. Please don't hesitate to contact me if I can be of assistance to you before our next scheduled appointment.  Your next care management appointment is by telephone on Wednesday, September 10 at 1:15 PM  Please call the care guide team at [phone] if you need to cancel, schedule, or reschedule an appointment.   Please call [phone]-TALK (toll free, 24 hour hotline) if you are experiencing a Mental Health or Behavioral Health Crisis or need someone to talk to.  Clayborne Ly RN BSN CCM [place]  [facility], [facility] Nurse Care Coordinator  Direct Dial: [phone] Website: [email or web address]

## 2024-01-16 NOTE — Patient Outreach (Signed)
 Complex Care Management   Visit Note  01/16/2024  Name:  [name] MRN: [id number] DOB: 16-Nov-1945  Situation: Referral received for Complex Care Management related to Vascular Parkinsonism, bradykinesia, pseudobulbar affect COPD, chronic Asthma, OSA w/CPAP use, CAD. I obtained verbal consent from Patient.  Visit completed with patient on the phone.  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 Blanca   Chronic asthma 05/17/2011   Chronic low back pain with sciatica 11/14/2021   Chronic pain 11/21/2016   COPD (chronic obstructive pulmonary disease)    Dizziness 12/24/2021   Early satiety 09/11/2021   Erectile dysfunction    Esophageal motility disorder 09/11/2021   Generalized anxiety disorder    GERD (gastroesophageal reflux disease)    History of benign positional vertigo    History of colonic polyps 09/11/2021   History of kidney stones    Hoarseness 09/11/2021   Hyperlipidemia    Hypertension    Hypogonadism male    prior use on Testim    Insomnia    Iron deficiency 09/11/2021   Left ventricular dysfunction 10/12/2018   Leg pain, left 11/14/2021   Lumbar disc disease    Major depressive disorder    Mild concentric left ventricular hypertrophy (LVH)    Muscle spasm 11/14/2021   Old inferior myocardial infarction 1995   OSA (obstructive sleep apnea) 11/12/2022   NPSG 10/21/22- AHI 28.7/ hr, desat to 89%/Mean 93%,  body weight 165 lbs     Pain in both feet 08/10/2021   Peripheral vascular disease 12/25/2021   Pneumonia    Presbycusis of both ears 04/23/2018   Shortness of breath dyspnea    Slow transit constipation 09/11/2021   Spinal cord stimulator status 11/14/2021   Spondylolisthesis of lumbar region 02/28/2015   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    Assessment: Patient Reported Symptoms:  Cognitive Cognitive Status: Alert and oriented to person, place, and time, Normal speech and language skills, Struggling with memory recall, Difficulties with attention and concentration Cognitive/Intellectual Conditions Management [RPT]: Other Other: Vascular parkinsonism, Mild Cognitive Impairment; hx of ADHD   Health Maintenance Behaviors: Stress management, Annual physical exam, Healthy diet Health Facilitated by: Stress management, Healthy diet  Neurological Neurological Review of Symptoms: Other:, Dizziness Oher Neurological Symptoms/Conditions [RPT]: Vascular parkinsonism, tremors; Bradykinesia; freezing; hx of History of benign positional vertigo Neurological Management Strategies: Adequate rest, Medication therapy, Routine screening, Medical device Neurological Self-Management Outcome: 3 (uncertain)  HEENT HEENT Symptoms Reported: No symptoms reported      Cardiovascular Cardiovascular Symptoms Reported: Dizziness Does patient have uncontrolled Hypertension?: No Cardiovascular Management Strategies: Adequate rest, Medication therapy, Routine screening Cardiovascular Self-Management Outcome: 4 (good) Cardiovascular Comment: hx of HTN, PAD, CAD, Atherosclerosis  Respiratory Respiratory Symptoms Reported: No symptoms reported Other Respiratory Symptoms: hx of OSA w/CPAP; COPD; Chronic Asthma Respiratory Management Strategies: CPAP, Routine screening, Medication therapy Respiratory Self-Management Outcome: 4 (good)  Endocrine Endocrine Symptoms  Reported: No symptoms reported Is patient diabetic?:  No    Gastrointestinal Gastrointestinal Symptoms Reported: No symptoms reported Additional Gastrointestinal Details: hx of GERD;hx of Esophageal motility disorder; hx of slow transit constipation      Genitourinary Genitourinary Symptoms Reported: No symptoms reported Additional Genitourinary Details: hx of kidney stones    Integumentary Integumentary Symptoms Reported: Skin changes Additional Integumentary Details: dry scalp Skin Management Strategies: Medication therapy, Routine screening Skin Self-Management Outcome: 4 (good)  Musculoskeletal Musculoskelatal Symptoms Reviewed: Limited mobility, Difficulty walking, Unsteady gait, Back pain Additional Musculoskeletal Details: chronic back pain, Parkinson's disease; hx of lumbar disc disease; hx of Spondylolisthesis of lumbar region Musculoskeletal Management Strategies: Medical device, Routine screening Musculoskeletal Self-Management Outcome: 3 (uncertain) Falls in the past year?: Yes Number of falls in past year: 1 or less Was there an injury with Fall?: No Fall Risk Category Calculator: 1 Patient Fall Risk Level: Low Fall Risk Patient at Risk for Falls Due to: History of fall(s), Impaired balance/gait, Impaired mobility Fall risk Follow up: Falls evaluation completed, Education provided, Falls prevention discussed  Psychosocial Psychosocial Symptoms Reported: Other Other Psychosocial Conditions: Pseudobulbar affect Behavioral Management Strategies: Complementary therapy(ies), Medication therapy, Support system Behavioral Health Self-Management Outcome: 4 (good)   Quality of Family Relationships: involved, supportive, helpful      01/14/2024   10:14 AM  Depression screen PHQ 2/9  Decreased Interest 0  Down, Depressed, Hopeless 0  PHQ - 2 Score 0    There were no vitals filed for this visit.  Medications Reviewed Today     Reviewed by Morgan Clayborne CROME, RN (Registered Nurse) on 01/16/24 at 1153  Med List Status: <None>    Medication Order Taking? Sig Documenting Provider Last Dose Status Informant  albuterol  (PROAIR  HFA) 108 (90 Base) MCG/ACT inhaler [id number] Yes Inhale 2 puffs into the lungs every 6 (six) hours as needed for wheezing or shortness of breath. [name], MD  Active   ALPRAZolam  (XANAX ) 0.25 MG tablet [id number] Yes TAKE 1 TABLET BY MOUTH 2 TIMES DAILY AS NEEDED FOR ANXIETY. [name], MD  Active   amoxicillin  (AMOXIL ) 500 MG tablet [id number] Yes Take 500 mg by mouth once a week. Take one dose before a dental procedure [provider]  Active   atorvastatin  (LIPITOR ) 20 MG tablet [id number] Yes Take 1 tablet (20 mg total) by mouth daily. Lalonde, John C, MD  Active   azelastine  (ASTELIN ) 0.1 % nasal spray [id number] Yes Place 2 sprays into both nostrils 2 (two) times daily. Use in each nostril as directed [name], MD  Active   buPROPion  (WELLBUTRIN  XL) 300 MG 24 hr tablet [id number] Yes TAKE 1 TABLET BY MOUTH DAILY Lalonde, John C, MD  Active   calcium  carbonate (OS-CAL) 600 MG TABS tablet [id number] Yes Take 600 mg by mouth daily with breakfast. [provider]  Active Self  carbidopa -levodopa  (SINEMET  IR) 25-100 MG tablet [id number] Yes Take 1 tablet by mouth 3 (three) times daily. 8am/noon/4pm Tat, Asberry RAMAN, DO  Active   cetirizine (ZYRTEC) 10 MG tablet [id number] Yes Take 10 mg by mouth daily. [provider]  Active Self  clopidogrel  (PLAVIX ) 75 MG tablet [id number] Yes Take 1 tablet (75 mg total) by mouth daily. Monetta Redell PARAS, MD  Active   desvenlafaxine  (PRISTIQ ) 50 MG 24 hr tablet [id number] Yes Take 1 tablet (50 mg total) by mouth daily. Lalonde, John C, MD  Active   diltiazem  (CARDIZEM  CD) 240 MG 24 hr capsule [id number] Yes  Take 1 capsule (240 mg total) by mouth every other day.  Patient taking differently: Take 240 mg by mouth daily. Patient is taking daily per Dr. Joyce Leiter, Redell PARAS, MD  Active   ferrous sulfate 325 (65 FE) MG tablet  [id number] Yes Take 325 mg by mouth daily.  Patient taking differently: Take 325 mg by mouth daily. Patient takes intermittently   [provider]  Active Self           Med Note ABE ZANE CHRISTELLA [place] Feb 27, 2023  8:49 AM)    Flaxseed, Linseed, (FLAX SEED OIL) 1000 MG CAPS [id number] Yes Take 1,000 mg by mouth every 7 (seven) days. [provider]  Active Self  fluocinonide (LIDEX) 0.05 % external solution [id number] Yes Apply 1 Application topically 2 (two) times daily. [provider]  Active   GLUCOSAMINE CHONDROITIN COMPLX PO [id number] Yes Take 1 tablet by mouth daily. [provider]  Active Self  guaiFENesin (MUCINEX) 600 MG 12 hr tablet [id number] Yes Take 600 mg by mouth 2 (two) times daily as needed for cough. [provider]  Active Self  meclizine  (ANTIVERT ) 25 MG tablet [id number] Yes TAKE 1 TWICE A DAY AS NEEDED FOR DIZZINESS Lalonde, John C, MD  Active   methylphenidate  (CONCERTA ) 54 MG PO CR tablet [id number] Yes Take 1 tablet (54 mg total) by mouth every morning. Lalonde, John C, MD  Active   Multiple Vitamins-Minerals (MULTIVITAMIN WITH MINERALS) tablet [id number] Yes Take 1 tablet by mouth daily. [provider]  Active Self  pantoprazole  (PROTONIX ) 40 MG tablet [id number] Yes Take 1 tablet (40 mg total) by mouth daily. [name], MD  Active   umeclidinium-vilanterol (ANORO ELLIPTA ) 62.5-25 MCG/ACT AEPB [id number] Yes Inhale 1 puff into the lungs daily at 6 (six) AM. [name], MD  Active             Recommendation:   Specialty provider follow-up with Camie Sevin PA-C on 02/27/24 at 11:30 AM  Follow Up Plan:   Telephone follow up appointment date/time:  Wednesday, September 10 at 1:15 PM  Clayborne Ly RN BSN CCM [place]  [facility], [facility] Nurse Care Coordinator  Direct Dial: [phone] Website: [email or web address]

## 2024-01-23 ENCOUNTER — Encounter: Payer: Self-pay | Admitting: Neurology

## 2024-01-26 ENCOUNTER — Other Ambulatory Visit: Payer: Self-pay | Admitting: Licensed Clinical Social Worker

## 2024-01-26 NOTE — Patient Instructions (Signed)
 Visit Information  Thank you for taking time to visit with me today. Please don't hesitate to contact me if I can be of assistance to you before our next scheduled appointment.  Your next care management appointment is by telephone on 09/22 at 11 AM   Please call the care guide team at [phone] if you need to cancel, schedule, or reschedule an appointment.   Please call the Suicide and Crisis Lifeline: 988 go to [facility] [address], [place] [phone]) call 911 if you are experiencing a Mental Health or Behavioral Health Crisis or need someone to talk to.  Rolin Kerns, LCSW [place]  [facility], [facility] Clinical Social Worker Direct Dial: [phone]  Fax: [phone] Website: [email or web address] 12:58 PM

## 2024-01-26 NOTE — Patient Outreach (Signed)
 Complex Care Management   Visit Note  01/26/2024  Name:  [name] MRN: [id number] DOB: 1945/11/05  Situation: Referral received for Complex Care Management related to Mental/Behavioral Health diagnosis Depression, GAD, MCI I obtained verbal consent from Patient.  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 Blanca   Chronic asthma 05/17/2011   Chronic low back pain with sciatica 11/14/2021   Chronic pain 11/21/2016   COPD (chronic obstructive pulmonary disease)    Dizziness 12/24/2021   Early satiety 09/11/2021   Erectile dysfunction    Esophageal motility disorder 09/11/2021   Generalized anxiety disorder    GERD (gastroesophageal reflux disease)    History of benign positional vertigo    History of colonic polyps 09/11/2021   History of kidney stones    Hoarseness 09/11/2021   Hyperlipidemia    Hypertension    Hypogonadism male    prior use on Testim    Insomnia    Iron deficiency 09/11/2021   Left ventricular dysfunction 10/12/2018   Leg pain, left 11/14/2021   Lumbar disc disease    Major depressive disorder    Mild concentric left ventricular hypertrophy (LVH)    Muscle spasm 11/14/2021   Old inferior myocardial infarction 1995   OSA (obstructive sleep apnea) 11/12/2022   NPSG 10/21/22- AHI 28.7/ hr, desat to 89%/Mean 93%, body weight 165 lbs     Pain in both feet  08/10/2021   Peripheral vascular disease 12/25/2021   Pneumonia    Presbycusis of both ears 04/23/2018   Shortness of breath dyspnea    Slow transit constipation 09/11/2021   Spinal cord stimulator status 11/14/2021   Spondylolisthesis of lumbar region 02/28/2015   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    Assessment: Patient Reported Symptoms:  Cognitive Cognitive Status: No symptoms reported, Alert and oriented to person, place, and time, Normal speech and language skills Cognitive/Intellectual Conditions Management [RPT]: Other Other: Vascular Parkinsonism, Mild Cognitive Impairment; hx of ADHD   Health Maintenance Behaviors: Annual physical exam  Neurological Neurological Review of Symptoms: Not assessed    HEENT HEENT Symptoms Reported: No symptoms reported      Cardiovascular Cardiovascular Symptoms Reported: Not assessed    Respiratory Respiratory Symptoms Reported: No symptoms reported    Endocrine Endocrine Symptoms Reported: Not assessed    Gastrointestinal Gastrointestinal Symptoms Reported: No symptoms reported      Genitourinary Genitourinary Symptoms Reported: Not assessed    Integumentary Integumentary Symptoms Reported: Not assessed    Musculoskeletal Musculoskelatal Symptoms Reviewed: Not assessed        Psychosocial Psychosocial Symptoms Reported: Other Other Psychosocial Conditions: Stress Additional Psychological Details: Patient endorsed increase in stress due to management of medical appts, noting one day he had three scheduled in one day. Strategies to assist with promoting self-care discussed Behavioral Management Strategies: Coping strategies, Support system, Adequate rest  Major Change/Loss/Stressor/Fears (CP): Medical condition, self, Medical condition, family Techniques to [place] with Loss/Stress/Change: Diversional activities        01/14/2024   10:14 AM  Depression screen PHQ 2/9  Decreased Interest 0  Down, Depressed, Hopeless 0  PHQ - 2  Score 0    There were no vitals filed for this visit.  Medications Reviewed Today     Reviewed by Jaishawn Witzke D, LCSW (Social Worker) on 01/26/24 at 1106  Med List Status: <None>   Medication Order Taking? Sig Documenting Provider Last Dose Status Informant  albuterol  (PROAIR  HFA) 108 (90 Base) MCG/ACT inhaler [id number]  Inhale 2 puffs into the lungs every 6 (six) hours as needed for wheezing or shortness of breath. [name], MD  Active   ALPRAZolam  (XANAX ) 0.25 MG tablet [id number]  TAKE 1 TABLET BY MOUTH 2 TIMES DAILY AS NEEDED FOR ANXIETY. [name], MD  Active   amoxicillin  (AMOXIL ) 500 MG tablet [id number]  Take 500 mg by mouth once a week. Take one dose before a dental procedure [provider]  Active   atorvastatin  (LIPITOR ) 20 MG tablet [id number]  Take 1 tablet (20 mg total) by mouth daily. [name], MD  Active   azelastine  (ASTELIN ) 0.1 % nasal spray [id number]  Place 2 sprays into both nostrils 2 (two) times daily. Use in each nostril as directed [name], MD  Active   buPROPion  (WELLBUTRIN  XL) 300 MG 24 hr tablet [id number]  TAKE 1 TABLET BY MOUTH DAILY Lalonde, John C, MD  Active   calcium  carbonate (OS-CAL) 600 MG TABS tablet [id number]  Take 600 mg by mouth daily with breakfast. [provider]  Active Self  carbidopa -levodopa  (SINEMET  IR) 25-100 MG tablet [id number]  Take 1 tablet by mouth 3 (three) times daily. 8am/noon/4pm Tat, Asberry RAMAN, DO  Active   cetirizine (ZYRTEC) 10 MG tablet [id number]  Take 10 mg by mouth daily. [provider]  Active Self  clopidogrel  (PLAVIX ) 75 MG tablet [id number]  Take 1 tablet (75 mg total) by mouth daily. Monetta Redell PARAS, MD  Active   desvenlafaxine  (PRISTIQ ) 50 MG 24 hr tablet [id number]  Take 1 tablet (50 mg total) by mouth daily. [name], MD  Active   diltiazem  (CARDIZEM  CD) 240 MG 24 hr capsule [id number]  Take 1 capsule (240 mg total) by mouth every other day.  Patient taking  differently: Take 240 mg by mouth daily. Patient is taking daily per Dr. Joyce Monetta, Redell PARAS, MD  Active   ferrous sulfate 325 (65 FE) MG tablet [id number]  Take 325 mg by mouth daily.  Patient taking differently: Take 325 mg by mouth daily. Patient takes intermittently   [provider]  Active Self           Med Note ABE ZANE CHRISTELLA [place] Feb 27, 2023  8:49 AM)    Flaxseed, Linseed, (FLAX SEED OIL) 1000 MG CAPS [id number]  Take 1,000 mg by mouth every 7 (seven) days. [provider]  Active Self  fluocinonide (LIDEX) 0.05 % external solution [id number]  Apply 1 Application topically 2 (two) times daily. [provider]  Active   GLUCOSAMINE CHONDROITIN COMPLX PO [id number]  Take 1 tablet by mouth daily. [provider]  Active Self  guaiFENesin (MUCINEX) 600 MG 12 hr tablet [id number]  Take 600 mg by mouth 2 (two) times daily as needed for cough. [provider]  Active Self  meclizine  (  ANTIVERT ) 25 MG tablet [id number]  TAKE 1 TWICE A DAY AS NEEDED FOR DIZZINESS Lalonde, John C, MD  Active   methylphenidate  (CONCERTA ) 54 MG PO CR tablet [id number]  Take 1 tablet (54 mg total) by mouth every morning. Lalonde, John C, MD  Active   Multiple Vitamins-Minerals (MULTIVITAMIN WITH MINERALS) tablet [id number]  Take 1 tablet by mouth daily. [provider]  Active Self  pantoprazole  (PROTONIX ) 40 MG tablet [id number]  Take 1 tablet (40 mg total) by mouth daily. [name], MD  Active   umeclidinium-vilanterol (ANORO ELLIPTA ) 62.5-25 MCG/ACT AEPB [id number]  Inhale 1 puff into the lungs daily at 6 (six) AM. [name], MD  Active             Recommendation:   Continue Current Plan of Care  Follow Up Plan:   Telephone follow-up in 1 month  Rolin Kerns, LCSW [facility]  [facility], [facility] Clinical Social Worker Direct Dial: [phone]  Fax: [phone] Website: [email or web address] 12:57  PM

## 2024-02-04 ENCOUNTER — Other Ambulatory Visit: Payer: Self-pay | Admitting: Family Medicine

## 2024-02-04 DIAGNOSIS — I10 Essential (primary) hypertension: Secondary | ICD-10-CM

## 2024-02-05 ENCOUNTER — Other Ambulatory Visit

## 2024-02-05 NOTE — Progress Notes (Signed)
 02/05/2024 Name: [name] MRN: [id number] DOB: [birth year]  Chief Complaint  Patient presents with   Medication Management    [name] is a 78 y.o. year old male who presented for a telephone visit.   They were referred to the pharmacist by their Case Management Team  for assistance in managing complex medication management.    Subjective:  Care Team: Primary Care Provider: [name], MD ; Next Scheduled Visit: 01/17/25  Medication Access/Adherence  Current Pharmacy:  [id number] , [place] - [address] [place] [address] Phone: [phone] Fax: [phone]  [id number] [place], [place] - [address] [place] [address] Phone: [phone] Fax: [phone]  [facility] - [place], [place] - [address] [place] [address] Phone: [phone] Fax: [phone]  [facility]) - [place], [place] - [address] [facility] [address] [place] [address] [place] [address] Phone: [phone] Fax: [phone]  [id number] - [place], [place] - [address] [place] [address] Phone: [phone] Fax: [phone]  [facility] - [place], [place] - [address] [place] [address] [address] Phone: [phone] Fax: [phone]  [place] - [facility] [address] [place] [address] Phone: [phone] Fax: [phone]   Patient reports affordability concerns with their medications: No  Patient reports access/transportation concerns to their pharmacy: No  Patient reports adherence concerns with their medications:  Yes  - a lot of work to keep up with all medications and sort them himself with pill organizer   Medication Management:  Current adherence  strategy: 3 week pill organizer  Patient reports Good adherence to medications  Patient reports the following barriers to adherence: worried about inaccurately sorting his meds  Interested in compliance packaging for someone to sort meds for him    Objective:  Lab Results  Component Value Date   HGBA1C 5.4 05/19/2012    Lab Results  Component Value Date   CREATININE 0.93 08/09/2023   BUN 15 08/09/2023   NA 135 08/09/2023   K 3.7 08/09/2023   CL 106 08/09/2023   CO2 23 08/09/2023    Lab Results  Component Value Date   CHOL 136 01/14/2024   HDL 36 (L) 01/14/2024   LDLCALC 82 01/14/2024   TRIG 94 01/14/2024   CHOLHDL 3.8 01/14/2024    Medications Reviewed Today     Reviewed by Lionell Jon DEL, RPH (Pharmacist) on 02/05/24 at 1146  Med List Status: <None>   Medication Order Taking? Sig Documenting Provider Last Dose Status Informant  albuterol  (PROAIR  HFA) 108 (90 Base) MCG/ACT inhaler [id number] Yes Inhale 2 puffs into the lungs every 6 (six) hours as needed for wheezing or shortness of breath. Lalonde, John C, MD  Active   ALPRAZolam  (XANAX ) 0.25 MG tablet [id number] Yes TAKE 1 TABLET BY MOUTH 2 TIMES DAILY AS NEEDED FOR ANXIETY. [name], MD  Active   amoxicillin  (AMOXIL ) 500 MG tablet [id number]  Take 500 mg by mouth once a week. Take one dose before a dental procedure [provider]  Active   atorvastatin  (LIPITOR ) 20 MG tablet [id number] Yes Take 1 tablet (20 mg total) by mouth daily. Lalonde, John C, MD  Active   azelastine  (ASTELIN ) 0.1 % nasal spray [id number] Yes  Place 2 sprays into both nostrils 2 (two) times daily. Use in each nostril as directed [name], MD  Active   buPROPion  (WELLBUTRIN  XL) 300 MG 24 hr tablet [id number]  TAKE 1 TABLET BY MOUTH DAILY Lalonde, John C, MD  Active   calcium  carbonate (OS-CAL) 600 MG TABS tablet [id number] Yes Take 600 mg by mouth daily with breakfast. [provider]  Active Self  carbidopa -levodopa   (SINEMET  IR) 25-100 MG tablet [id number] Yes Take 1 tablet by mouth 3 (three) times daily. 8am/noon/4pm Tat, Asberry RAMAN, DO  Active   cetirizine (ZYRTEC) 10 MG tablet [id number]  Take 10 mg by mouth daily. [provider]  Active Self  clopidogrel  (PLAVIX ) 75 MG tablet [id number] Yes Take 1 tablet (75 mg total) by mouth daily. Monetta Redell PARAS, MD  Active   desvenlafaxine  (PRISTIQ ) 50 MG 24 hr tablet [id number] Yes Take 1 tablet (50 mg total) by mouth daily. Lalonde, John C, MD  Active   diltiazem  (CARDIZEM  CD) 240 MG 24 hr capsule [id number] Yes TAKE 1 CAPSULE BY MOUTH EVERY DAY Lalonde, John C, MD  Active   ferrous sulfate 325 (65 FE) MG tablet [id number]  Take 325 mg by mouth daily.  Patient taking differently: Take 325 mg by mouth daily. Patient takes intermittently   [provider]  Active Self           Med Note ABE ZANE CHRISTELLA [place] Feb 27, 2023  8:49 AM)    Flaxseed, Linseed, (FLAX SEED OIL) 1000 MG CAPS [id number]  Take 1,000 mg by mouth every 7 (seven) days. [provider]  Active Self  fluocinonide (LIDEX) 0.05 % external solution [id number]  Apply 1 Application topically 2 (two) times daily. [provider]  Active   GLUCOSAMINE CHONDROITIN COMPLX PO [id number] Yes Take 1 tablet by mouth daily. [provider]  Active Self  guaiFENesin (MUCINEX) 600 MG 12 hr tablet [id number] Yes Take 600 mg by mouth 2 (two) times daily as needed for cough. [provider]  Active Self  meclizine  (ANTIVERT ) 25 MG tablet [id number] Yes TAKE 1 TWICE A DAY AS NEEDED FOR DIZZINESS Lalonde, John C, MD  Active   methylphenidate  (CONCERTA ) 54 MG PO CR tablet [id number] Yes Take 1 tablet (54 mg total) by mouth every morning. Lalonde, John C, MD  Active   Multiple Vitamins-Minerals (MULTIVITAMIN WITH MINERALS) tablet [id number] Yes Take 1 tablet by mouth daily. [provider]  Active Self  pantoprazole  (PROTONIX ) 40 MG tablet [id number] Yes Take 1 tablet (40  mg total) by mouth daily. Lalonde, John C, MD  Active   umeclidinium-vilanterol (ANORO ELLIPTA ) 62.5-25 MCG/ACT AEPB [id number] Yes Inhale 1 puff into the lungs daily at 6 (six) AM. [name], MD  Active               Assessment/Plan:   Medication Management: - Currently strategy insufficient to maintain appropriate adherence to prescribed medication regimen - Suggested use of weekly pill box to organize medications - Created list of medication, indication, and administration time. Provided to patient - Discussed collaboration with local pharmacies for adherence packaging. Reviewed local pharmacies with adherence packaging options. Patient would like to think this over and discuss with wife. Requested info via mychart to summarize pros/cons. Sent. Follow up scheduled    Follow Up Plan: 1 week in office  Jon VEAR [place], PharmD Clinical Pharmacist [phone]

## 2024-02-12 ENCOUNTER — Ambulatory Visit

## 2024-02-12 DIAGNOSIS — Z79899 Other long term (current) drug therapy: Secondary | ICD-10-CM

## 2024-02-12 NOTE — Progress Notes (Signed)
   02/12/2024 Name: [name] MRN: [id number] DOB: 09/26/1945  Chief Complaint  Patient presents with   Medication Management    [name] is a 78 y.o. year old male who presented for an office visit. They were referred to the pharmacist by their Case Management Team  for assistance in managing complex medication management.    Subjective:  Care Team: Primary Care Provider: [name], MD ; Next Scheduled Visit: 01/17/25  Medication Access/Adherence  Current Pharmacy:  [id number] [place], [place] - [address] [place] [address] Phone: [phone] Fax: [phone]  [id number] - [place], [place] - [address] [place] [address] Phone: [phone] Fax: [phone]  [facility] - [place], [place] - [address] [place] [address] Phone: [phone] Fax: [phone]  [facility]) - [place], [place] - [address] [facility] [address] [place] [address] [place] [address] Phone: [phone] Fax: [phone]  [facility] [id number] - [place], [place] - [address] [place] [address] Phone: [phone] Fax: [phone]  [facility] - [place], [place] - [address] [place] [address] Phone: [phone] Fax: [phone]  [place] - [facility] [address] [place] [address] Phone: [phone] Fax: [phone]   Patient reports affordability concerns with their medications: No  Patient reports access/transportation concerns to their pharmacy: No  Patient reports adherence concerns with their medications:  Yes  - a lot of work to keep up with all medications and sort them himself with pill organizer   Medication Management:  Current adherence  strategy: 3 week pill organizer  Patient reports Good adherence to medications  Patient reports the following barriers to adherence: worried about inaccurately sorting his meds  Interested in compliance packaging for someone to sort meds for him. Wanted to come in office for review with wife present.    Objective:  Lab Results  Component Value Date   HGBA1C 5.4 05/19/2012    Lab Results  Component Value Date   CREATININE 0.93 08/09/2023   BUN 15 08/09/2023   NA 135 08/09/2023   K 3.7 08/09/2023   CL 106 08/09/2023   CO2 23 08/09/2023    Lab Results  Component Value Date   CHOL 136 01/14/2024   HDL 36 (L) 01/14/2024   LDLCALC 82 01/14/2024   TRIG 94 01/14/2024   CHOLHDL 3.8 01/14/2024    Medications Reviewed Today   Medications were not reviewed in this encounter       Assessment/Plan:   Medication Management: - Currently strategy insufficient to maintain appropriate adherence to prescribed medication regimen - Suggested use of weekly pill box to organize medications - Created list of medication, indication, and administration time. Provided to patient - Discussed collaboration with local pharmacies for adherence packaging. Reviewed local pharmacies with adherence packaging options. Discussed [facility] [employer] and [employer] options in detail. Patient would like to explore [employer] option. Gave them the contact phone number to ask questions and setup enrollment at that time if they would like.    Follow Up Plan: 1 week  Jon VEAR [place], PharmD Clinical Pharmacist [phone]

## 2024-02-18 ENCOUNTER — Other Ambulatory Visit: Payer: Self-pay

## 2024-02-18 NOTE — Patient Outreach (Addendum)
 Placed a successful outbound call to patient. Discussed patient would like to dis-enroll from the complex care management program, he will consider for the future if needed. Instructed patient to keep his doctor informed of new symptoms or concerns and or if he would like to re-enroll into the CCM program and he verbalizes understanding. Routed note to PCP.   Clayborne Ly RN BSN CCM [place]  [facility], [facility] Nurse Care Coordinator  Direct Dial: [phone] Website: [email or web address]

## 2024-02-19 ENCOUNTER — Other Ambulatory Visit

## 2024-02-19 DIAGNOSIS — Z79899 Other long term (current) drug therapy: Secondary | ICD-10-CM

## 2024-02-19 NOTE — Progress Notes (Signed)
   02/19/2024  Patient ID: [name], male   DOB: Nov 12, 1945, 78 y.o.   MRN: [id number]  Contacted patient via telephone to follow up on compliance med packaging. At this time, they have decided not to pursue any medication packaging assistance and will continue to sort themselves with a pill organizer.  Counseled that they can reach back out at anytime if they change their mind or have other questions regarding medication needs.  [name], PharmD Clinical Pharmacist [phone]

## 2024-02-20 ENCOUNTER — Telehealth: Payer: Self-pay

## 2024-02-20 NOTE — Telephone Encounter (Signed)
 Called patient and spoke to his wife Winton. She wanted to see if Dr. Evonnie would consider a referral  for Home Physical Therapy. Winton states that patient might be a little constricted with their home layout which is causing him to not want to use his walker. She feels with some help from Home Physical therapy someone can help show patient ways to get around safely.

## 2024-02-20 NOTE — Telephone Encounter (Signed)
 Pt 's wife called in this morning, and she stated that her husband suppose to be using a walker, but he is not using it. The wife stated that she feels he is not using it due to  the house lay out. The wife is asking about physical therapy. Please call. Thanks

## 2024-02-23 ENCOUNTER — Other Ambulatory Visit: Payer: Self-pay

## 2024-02-23 DIAGNOSIS — M519 Unspecified thoracic, thoracolumbar and lumbosacral intervertebral disc disorder: Secondary | ICD-10-CM

## 2024-02-23 DIAGNOSIS — G20A1 Parkinson's disease without dyskinesia, without mention of fluctuations: Secondary | ICD-10-CM

## 2024-02-23 NOTE — Telephone Encounter (Signed)
Sent to [facility]

## 2024-02-24 ENCOUNTER — Other Ambulatory Visit: Payer: Self-pay | Admitting: Cardiology

## 2024-02-24 DIAGNOSIS — I2510 Atherosclerotic heart disease of native coronary artery without angina pectoris: Secondary | ICD-10-CM

## 2024-02-26 ENCOUNTER — Telehealth: Payer: Self-pay | Admitting: Neurology

## 2024-02-26 NOTE — Telephone Encounter (Signed)
 Home health once a week for 1 week/ Twice a week for 3 weeks/ Once a week for 1 week Speech and occupational therapy

## 2024-02-26 NOTE — Telephone Encounter (Signed)
Called and gave orders  

## 2024-02-27 ENCOUNTER — Ambulatory Visit: Payer: Medicare Other | Admitting: Physician Assistant

## 2024-03-01 ENCOUNTER — Other Ambulatory Visit: Payer: Self-pay | Admitting: Licensed Clinical Social Worker

## 2024-03-01 DIAGNOSIS — Z85038 Personal history of other malignant neoplasm of large intestine: Secondary | ICD-10-CM | POA: Insufficient documentation

## 2024-03-01 NOTE — Patient Instructions (Signed)
 Visit Information  Thank you for taking time to visit with me today. Please don't hesitate to contact me if I can be of assistance to you before our next scheduled appointment.   Closing From: Complex Care Management.  Please call the care guide team at [phone] if you need to cancel, schedule, or reschedule an appointment.   Please call the Suicide and Crisis Lifeline: 988 go to [facility] [address], [place] [phone]) call 911 if you are experiencing a Mental Health or Behavioral Health Crisis or need someone to talk to.  Rolin Kerns, LCSW [place]  [facility], [facility] Clinical Social Worker Direct Dial: [phone]  Fax: [phone] Website: [email or web address] 5:13 PM

## 2024-03-01 NOTE — Patient Outreach (Signed)
 Complex Care Management   Visit Note  03/01/2024  Name:  [name] MRN: [id number] DOB: 1946-01-12  Situation: Referral received for Complex Care Management related to Mental/Behavioral Health diagnosis Depression, GAD, MCI I obtained verbal consent from Patient.  Visit completed with Patient  on the phone  Background:   Past Medical History:  Diagnosis Date   Acquired hallux rigidus, right 03/25/2023   Acquired trigger finger of left middle finger 04/22/2019   Acquired trigger finger of right middle finger 04/22/2019   ADHD, predominantly hyperactive type    Allergic rhinitis due to pollen 08/08/2010   Allergy  vaccine 1:50,000 03/25/08; 1:10 03/27/09 > 1:2 GH >> DC'd 01/04/2015 for observation        Anemia    takes Fe -    Anginal pain    Ankylosing spondylitis 10/09/2018   Arthritis    Atherosclerosis of aorta 08/30/2020   Attention or concentration deficit [R41.840] 05/30/2023   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 Blanca   Chronic asthma 05/17/2011   Chronic low back pain with sciatica 11/14/2021   Chronic pain 11/21/2016   COPD (chronic obstructive pulmonary disease)    Dizziness 12/24/2021   Early satiety 09/11/2021   Erectile dysfunction    Esophageal motility disorder 09/11/2021   Generalized anxiety disorder    GERD (gastroesophageal reflux disease)    History of benign positional vertigo    History of colon cancer 03/01/2024   History of colonic polyps 09/11/2021   History of kidney stones    Hoarseness 09/11/2021   Hyperlipidemia    Hypertension    Hypogonadism male    prior use on [employer]    Insomnia    Iron deficiency 09/11/2021   Left ventricular dysfunction 10/12/2018   Leg pain, left 11/14/2021   Lumbar disc disease    Major depressive disorder    MCI (mild cognitive impairment) 05/30/2023   Mild concentric left ventricular hypertrophy (LVH)    Muscle spasm 11/14/2021   OSA (obstructive sleep apnea)  11/12/2022   NPSG 10/21/22- AHI 28.7/ hr, desat to 89%/Mean 93%, body weight 165 lbs     Pain in both feet 08/10/2021   Peripheral vascular disease 12/25/2021   Pneumonia    Presbycusis of both ears 04/23/2018   Slow transit constipation 09/11/2021   Spinal cord stimulator status 11/14/2021   Spondylolisthesis of lumbar region 02/28/2015   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   Weight decreased 09/11/2021    Assessment: Patient Reported Symptoms:  Cognitive Cognitive Status: Alert and oriented to person, place, and time, Normal speech and language skills Cognitive/Intellectual Conditions Management [RPT]: None reported or documented in medical history or problem list   Health Maintenance Behaviors: Annual physical exam  Neurological Neurological Review of Symptoms: Not assessed    HEENT HEENT Symptoms Reported: Not assessed      Cardiovascular Cardiovascular Symptoms Reported: Not assessed    Respiratory Respiratory Symptoms Reported: Not assesed    Endocrine Endocrine Symptoms Reported: Not assessed    Gastrointestinal Gastrointestinal Symptoms Reported: Not assessed      Genitourinary Genitourinary Symptoms Reported: Not assessed    Integumentary Integumentary Symptoms Reported: Not assessed    Musculoskeletal Musculoskelatal Symptoms Reviewed: Not assessed        Psychosocial Psychosocial Symptoms Reported: No symptoms reported Behavioral Management Strategies: Adequate rest, Support system, Coping strategies Behavioral Health Self-Management Outcome: 4 (good) Major Change/Loss/Stressor/Fears (CP): Denies Techniques to Cope with Loss/Stress/Change: Diversional activities, Spiritual practice(s), Medication      03/01/2024  PHQ2-9 Depression Screening   Little interest or pleasure in doing things    Feeling down, depressed, or hopeless    PHQ-2 - Total Score    Trouble falling or staying asleep, or sleeping too much    Feeling tired or having little energy     Poor appetite or overeating     Feeling bad about yourself - or that you are a failure or have let yourself or your family down    Trouble concentrating on things, such as reading the newspaper or watching television    Moving or speaking so slowly that other people could have noticed.  Or the opposite - being so fidgety or restless that you have been moving around a lot more than usual    Thoughts that you would be better off dead, or hurting yourself in some way    PHQ2-9 Total Score    If you checked off any problems, how difficult have these problems made it for you to do your work, take care of things at home, or get along with other people    Depression Interventions/Treatment      There were no vitals filed for this visit.  Medications Reviewed Today     Reviewed by Ezzard Rolin BIRCH, LCSW (Social Worker) on 03/01/24 at 1710  Med List Status: <None>   Medication Order Taking? Sig Documenting Provider Last Dose Status Informant  albuterol  (PROAIR  HFA) 108 (90 Base) MCG/ACT inhaler [id number]  Inhale 2 puffs into the lungs every 6 (six) hours as needed for wheezing or shortness of breath. [name], MD  Active   ALPRAZolam  (XANAX ) 0.25 MG tablet [id number]  TAKE 1 TABLET BY MOUTH 2 TIMES DAILY AS NEEDED FOR ANXIETY. [name], MD  Active   amoxicillin  (AMOXIL ) 500 MG tablet [id number]  Take 500 mg by mouth once a week. Take one dose before a dental procedure [provider]  Active   atorvastatin  (LIPITOR ) 20 MG tablet [id number]  Take 1 tablet (20 mg total) by mouth daily. Lalonde, John C, MD  Active   azelastine  (ASTELIN ) 0.1 % nasal spray [id number]  Place 2 sprays into both nostrils 2 (two) times daily. Use in each nostril as directed [name], MD  Active   buPROPion  (WELLBUTRIN  XL) 300 MG 24 hr tablet [id number]  TAKE 1 TABLET BY MOUTH DAILY Lalonde, John C, MD  Active   calcium  carbonate (OS-CAL) 600 MG TABS tablet [id number]  Take 600 mg by mouth daily with  breakfast. [provider]  Active Self  carbidopa -levodopa  (SINEMET  IR) 25-100 MG tablet [id number]  Take 1 tablet by mouth 3 (three) times daily. 8am/noon/4pm Tat, Asberry RAMAN, DO  Active   cetirizine (ZYRTEC) 10 MG tablet [id number]  Take 10 mg by mouth daily. [provider]  Active Self  clopidogrel  (PLAVIX ) 75 MG tablet [id number]  TAKE 1 TABLET BY MOUTH DAILY Munley, Redell PARAS, MD  Active   desvenlafaxine  (PRISTIQ ) 50 MG 24 hr tablet [id number]  Take 1 tablet (50 mg total) by mouth daily. Lalonde, John C, MD  Active   diltiazem  (CARDIZEM  CD) 240 MG 24 hr capsule [id number]  TAKE 1 CAPSULE BY MOUTH EVERY DAY [name], MD  Active   ferrous sulfate 325 (65 FE) MG tablet [id number]  Take 325 mg by mouth daily.  Patient taking differently: Take 325 mg by mouth daily. Patient takes intermittently   [provider]  Active Self  Med Note ABE ZANE HERO   Thu Feb 27, 2023  8:49 AM)    Flaxseed, Linseed, (FLAX SEED OIL) 1000 MG CAPS [id number]  Take 1,000 mg by mouth every 7 (seven) days. [provider]  Active Self  fluocinonide (LIDEX) 0.05 % external solution [id number]  Apply 1 Application topically 2 (two) times daily. [provider]  Active   GLUCOSAMINE CHONDROITIN COMPLX PO [id number]  Take 1 tablet by mouth daily. [provider]  Active Self  guaiFENesin (MUCINEX) 600 MG 12 hr tablet [id number]  Take 600 mg by mouth 2 (two) times daily as needed for cough. [provider]  Active Self  meclizine  (ANTIVERT ) 25 MG tablet [id number]  TAKE 1 TWICE A DAY AS NEEDED FOR DIZZINESS Lalonde, John C, MD  Active   methylphenidate  (CONCERTA ) 54 MG PO CR tablet [id number]  Take 1 tablet (54 mg total) by mouth every morning. Lalonde, John C, MD  Active   Multiple Vitamins-Minerals (MULTIVITAMIN WITH MINERALS) tablet [id number]  Take 1 tablet by mouth daily. [provider]  Active Self  pantoprazole  (PROTONIX ) 40 MG tablet  [id number]  Take 1 tablet (40 mg total) by mouth daily. [name], MD  Active   umeclidinium-vilanterol (ANORO ELLIPTA ) 62.5-25 MCG/ACT AEPB [id number]  Inhale 1 puff into the lungs daily at 6 (six) AM. [name], MD  Active             Recommendation:   Continue Current Plan of Care  Follow Up Plan:   Closing From:  Complex Care Management  Rolin Kerns, LCSW [place]  [facility], [facility] Clinical Social Worker Direct Dial: [phone]  Fax: [phone] Website: [email or web address] 5:12 PM

## 2024-03-01 NOTE — Progress Notes (Unsigned)
 Cardiology Office Note:    Date:  03/02/2024   ID:  [name], DOB 19-Jul-1945, MRN [id number]  PCP:  [name], MD  Cardiologist:  Redell Leiter, MD    Referring MD: [name], MD    ASSESSMENT:    1. Coronary artery disease of native artery of native heart with stable angina pectoris   2. Primary hypertension   3. Mixed hyperlipidemia   4. Ascending aorta enlargement    PLAN:    In order of problems listed above:  From cardiology perspective doing well no recurrent angina and he continues medical therapy including clopidogrel  diltiazem  and lipid-lowering with atorvastatin  Stable have asked his wife to check blood pressure sitting and standing at home daily at times people with Parkinson's have autonomic dysfunction may contribute to his falls will recheck orthostatics before he leaves my office Continue his current statin lipids are at target Using more current criteria his aorta is likely more top normal in size and I think he needs repeat imaging at this time   Next appointment: Plan to see him in 1 year   Medication Adjustments/Labs and Tests Ordered: Current medicines are reviewed at length with the patient today.  Concerns regarding medicines are outlined above.  No orders of the defined types were placed in this encounter.  No orders of the defined types were placed in this encounter.    History of Present Illness:    [name] is a 78 y.o. male with a hx of  CAD with PCI and balloon angioplasty 1995 subsequent PCI and stent right coronary artery 1995 LV dysfunction EF 40 to 45% subsequent recovery by echo hypertensive heart disease hyperlipidemia and mild enlargement ascending aorta 38 mm  last seen 02/27/2023.  An EKG performed March of this year that I independently reviewed EKG is sinus rhythm minor nonspecific ST change.  From a cardiology perspective doing well he is not having anginal discomfort no edema shortness of breath  palpitation or syncope he does have orthostatic lightheadedness we will recheck his standing blood pressure before leaving my office today Past Medical History:  Diagnosis Date   Acquired hallux rigidus, right 03/25/2023   Acquired trigger finger of left middle finger 04/22/2019   Acquired trigger finger of right middle finger 04/22/2019   ADHD, predominantly hyperactive type    Allergic rhinitis due to pollen 08/08/2010   Allergy  vaccine 1:50,000 03/25/08; 1:10 03/27/09 > 1:2 GH >> DC'd 01/04/2015 for observation        Anemia    takes Fe -    Anginal pain    Ankylosing spondylitis 10/09/2018   Arthritis    Atherosclerosis of aorta 08/30/2020   Attention or concentration deficit [R41.840] 05/30/2023   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 Blanca   Chronic asthma 05/17/2011   Chronic low back pain with sciatica 11/14/2021   Chronic pain 11/21/2016   COPD (chronic obstructive pulmonary disease)    Dizziness 12/24/2021   Early satiety 09/11/2021   Erectile dysfunction    Esophageal motility disorder 09/11/2021   Generalized anxiety disorder    GERD (gastroesophageal reflux disease)    History of benign positional vertigo    History of colon cancer 03/01/2024   History of colonic polyps 09/11/2021   History of kidney stones    Hoarseness 09/11/2021   Hyperlipidemia    Hypertension    Hypogonadism male    prior use on Testim    Insomnia  Iron deficiency 09/11/2021   Left ventricular dysfunction 10/12/2018   Leg pain, left 11/14/2021   Lumbar disc disease    Major depressive disorder    MCI (mild cognitive impairment) 05/30/2023   Mild concentric left ventricular hypertrophy (LVH)    Muscle spasm 11/14/2021   OSA (obstructive sleep apnea) 11/12/2022   NPSG 10/21/22- AHI 28.7/ hr, desat to 89%/Mean 93%, body weight 165 lbs     Pain in both feet 08/10/2021   Peripheral vascular disease 12/25/2021   Pneumonia    Presbycusis of both ears  04/23/2018   Slow transit constipation 09/11/2021   Spinal cord stimulator status 11/14/2021   Spondylolisthesis of lumbar region 02/28/2015   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   ALPRAZolam  (XANAX ) 0.25 MG tablet TAKE 1 TABLET BY MOUTH 2 TIMES DAILY AS NEEDED FOR ANXIETY.   amoxicillin  (AMOXIL ) 500 MG tablet Take 500 mg by mouth once a week. Take one dose before a dental procedure   atorvastatin  (LIPITOR ) 20 MG tablet Take 1 tablet (20 mg total) by mouth daily.   azelastine  (ASTELIN ) 0.1 % nasal spray Place 2 sprays into both nostrils 2 (two) times daily. Use in each nostril as directed   buPROPion  (WELLBUTRIN  XL) 300 MG 24 hr tablet TAKE 1 TABLET BY MOUTH DAILY   calcium  carbonate (OS-CAL) 600 MG TABS tablet Take 600 mg by mouth daily with breakfast.   carbidopa -levodopa  (SINEMET  IR) 25-100 MG tablet Take 1 tablet by mouth 3 (three) times daily. 8am/noon/4pm   cetirizine (ZYRTEC) 10 MG tablet Take 10 mg by mouth daily.   clopidogrel  (PLAVIX ) 75 MG tablet TAKE 1 TABLET BY MOUTH DAILY   desvenlafaxine  (PRISTIQ ) 50 MG 24 hr tablet Take 1 tablet (50 mg total) by mouth daily.   diltiazem  (CARDIZEM  CD) 240 MG 24 hr capsule TAKE 1 CAPSULE BY MOUTH EVERY DAY   ferrous sulfate 325 (65 FE) MG tablet Take 325 mg by mouth daily.   Flaxseed, Linseed, (FLAX SEED OIL) 1000 MG CAPS Take 1,000 mg by mouth every 7 (seven) days.   fluocinonide (LIDEX) 0.05 % external solution Apply 1 Application topically 2 (two) times daily.   GLUCOSAMINE CHONDROITIN COMPLX PO Take 1 tablet by mouth daily.   guaiFENesin (MUCINEX) 600 MG 12 hr tablet Take 600 mg by mouth 2 (two) times daily as needed for cough.   meclizine  (ANTIVERT ) 25 MG tablet TAKE 1 TWICE A DAY AS NEEDED FOR DIZZINESS    methylphenidate  (CONCERTA ) 54 MG PO CR tablet Take 1 tablet (54 mg total) by mouth every morning.   Multiple Vitamins-Minerals (MULTIVITAMIN WITH MINERALS) tablet Take 1 tablet by mouth daily.   pantoprazole  (PROTONIX ) 40 MG tablet Take 1 tablet (40 mg total) by mouth daily.   triamcinolone cream (KENALOG) 0.1 % Apply 1 Application topically.   umeclidinium-vilanterol (ANORO ELLIPTA ) 62.5-25 MCG/ACT AEPB Inhale 1 puff into the lungs daily at 6 (six) AM.      EKGs/Labs/Other Studies Reviewed:    The following studies were reviewed today:  Cardiac Studies & Procedures   ______________________________________________________________________________________________   STRESS TESTS  MYOCARDIAL PERFUSION IMAGING 07/04/2020  Interpretation Summary  Nuclear stress EF: 51%.  There was no ST segment deviation noted during stress.  Defect 1: There is a small defect of mild  severity present in the basal inferior and mid inferior location.  This is a low risk study.  The left ventricular ejection fraction is mildly decreased (45-54%).  No evidence of ischemia or MI.  Diaphragmatic attenuaton noted.  Low normal EF.   ECHOCARDIOGRAM  ECHOCARDIOGRAM COMPLETE 11/11/2018  Narrative ECHOCARDIOGRAM REPORT    Patient Name:   [name] Date of Exam: 11/11/2018 Medical Rec #:  [id number]           Height:       67.0 in Accession #:    [id number]          Weight:       167.0 lb Date of Birth:  [birth year]           BSA:          1.87 m Patient Age:    72 years            BP:           117/82 mmHg Patient Gender: M                   HR:           72 bpm. Exam Location:  [place]   Procedure: 2D Echo  Indications:    LV function  History:        Patient has no prior history of Echocardiogram examinations. CAD and Previous Myocardial Infarction COPD Signs/Symptoms: Shortness of Breath Risk Factors: Hypertension and Diabetes.  Sonographer:    Fairy Canton RDCS  (AE) Referring Phys: REDELL JINNY LEITER  IMPRESSIONS   1. The left ventricle has low normal systolic function, with an ejection fraction of 50-55%. The cavity size was normal. Left ventricular diastolic Doppler parameters are consistent with impaired relaxation. No evidence of left ventricular regional wall motion abnormalities. 2. The right ventricle has normal systolic function. The cavity was normal. There is no increase in right ventricular wall thickness. 3. No evidence of mitral valve stenosis. 4. The aortic valve is tricuspid. Mild thickening of the aortic valve. Mild calcification of the aortic valve. No stenosis of the aortic valve. 5. There is mild dilatation of the ascending aorta measuring 38 mm.  FINDINGS Left Ventricle: The left ventricle has low normal systolic function, with an ejection fraction of 50-55%. The cavity size was normal. There is no increase in left ventricular wall thickness. Left ventricular diastolic Doppler parameters are consistent with impaired relaxation. Normal left ventricular filling pressures No evidence of left ventricular regional wall motion abnormalities..  Right Ventricle: The right ventricle has normal systolic function. The cavity was normal. There is no increase in right ventricular wall thickness.  Left Atrium: Left atrial size was normal in size.  Right Atrium: Right atrial size was normal in size. Right atrial pressure is estimated at 3 mmHg.  Interatrial Septum: No atrial level shunt detected by color flow Doppler.  Pericardium: There is no evidence of pericardial effusion.  Mitral Valve: The mitral valve is normal in structure. Mitral valve regurgitation is not visualized by color flow Doppler. No evidence of mitral valve stenosis.  Tricuspid Valve: The tricuspid valve is normal in structure. Tricuspid valve regurgitation was not visualized by color flow Doppler.  Aortic Valve: The aortic valve is tricuspid Mild thickening of the aortic  valve. Mild calcification of the aortic valve. Aortic valve regurgitation was not visualized by color flow Doppler. There is No stenosis of the aortic valve.  Pulmonic Valve: The pulmonic valve was normal in  structure. Pulmonic valve regurgitation is trivial by color flow Doppler. No evidence of pulmonic stenosis.  Aorta: There is mild dilatation of the ascending aorta measuring 38 mm.  Venous: The inferior vena cava measures 1.76 cm, is normal in size with greater than 50% respiratory variability.   +--------------+--------++ LEFT VENTRICLE         +----------------+---------++ +--------------+--------++ Diastology                PLAX 2D                +----------------+---------++ +--------------+--------++ LV e' lateral:  9.36 cm/s LVIDd:        4.92 cm  +----------------+---------++ +--------------+--------++ LV E/e' lateral:7.9       LVIDs:        3.72 cm  +----------------+---------++ +--------------+--------++ LV e' medial:   7.72 cm/s LV PW:        0.90 cm  +----------------+---------++ +--------------+--------++ LV E/e' medial: 9.6       LV IVS:       0.92 cm  +----------------+---------++ +--------------+--------++ LVOT diam:    2.30 cm  +--------------+--------++ LV SV:        55 ml    +--------------+--------++ LV SV Index:  29.01    +--------------+--------++ LVOT Area:    4.15 cm +--------------+--------++                        +--------------+--------++  +---------------+----------++ RIGHT VENTRICLE           +---------------+----------++ RV S prime:    12.60 cm/s +---------------+----------++ TAPSE (M-mode):2.6 cm     +---------------+----------++  +---------------+-------++-----------++ LEFT ATRIUM           Index       +---------------+-------++-----------++ LA diam:       2.20 cm1.17 cm/m  +---------------+-------++-----------++ LA Vol (A2C):  60.3 ml32.19  ml/m +---------------+-------++-----------++ LA Vol (A4C):  40.4 ml21.57 ml/m +---------------+-------++-----------++ LA Biplane Vol:52.8 ml28.19 ml/m +---------------+-------++-----------++ +------------+---------++-----------++ RIGHT ATRIUM         Index       +------------+---------++-----------++ RA Area:    15.30 cm            +------------+---------++-----------++ RA Volume:  37.40 ml 19.97 ml/m +------------+---------++-----------++ +---------------+-----------++ AORTIC VALVE               +---------------+-----------++ AV Area (Vmax):1.84 cm    +---------------+-----------++ AV Vmax:       164.00 cm/s +---------------+-----------++ AV Peak Grad:  10.8 mmHg   +---------------+-----------++ LVOT Vmax:     72.50 cm/s  +---------------+-----------++ LVOT Vmean:    54.100 cm/s +---------------+-----------++ LVOT VTI:      0.169 m     +---------------+-----------++  +-------------+-------++ AORTA                +-------------+-------++ Ao Root diam:3.40 cm +-------------+-------++ Ao Asc diam: 3.80 cm +-------------+-------++  +--------------+----------++ MITRAL VALVE             +--------------+-------+ +--------------+----------++ SHUNTS                MV Area (PHT):3.83 cm   +--------------+-------+ +--------------+----------++ Systemic VTI: 0.17 m  MV PHT:       57.42 msec +--------------+-------+ +--------------+----------++ Systemic Diam:2.30 cm MV Decel Time:198 msec   +--------------+-------+ +--------------+----------++ +--------------+----------++ MV E velocity:74.40 cm/s +--------------+----------++ MV A velocity:91.30 cm/s +--------------+----------++ MV E/A ratio: 0.81       +--------------+----------++  +---------+-------+ IVC              +---------+-------+  IVC diam:1.76 cm +---------+-------+   Redell Leiter  MD Electronically signed by Redell Leiter MD Signature Date/Time: 11/11/2018/1:05:54 PM    Final          ______________________________________________________________________________________________          Recent Labs: 08/09/2023: BUN 15; Creatinine, Ser 0.93; Hemoglobin 12.0; Platelets 264; Potassium 3.7; Sodium 135  Recent Lipid Panel    Component Value Date/Time   CHOL 136 01/14/2024 1207   TRIG 94 01/14/2024 1207   HDL 36 (L) 01/14/2024 1207   CHOLHDL 3.8 01/14/2024 1207   CHOLHDL 3.4 10/15/2016 1056   VLDL 33 (H) 10/15/2016 1056   LDLCALC 82 01/14/2024 1207    Physical Exam:    VS:  BP 112/60   Pulse 90   Ht 5' 2 (1.575 m)   Wt 159 lb 6.4 oz (72.3 kg)   SpO2 96%   BMI 29.15 kg/m     Wt Readings from Last 3 Encounters:  03/02/24 159 lb 6.4 oz (72.3 kg)  01/14/24 160 lb 12.8 oz (72.9 kg)  12/23/23 162 lb (73.5 kg)     GEN:  Well nourished, well developed in no acute distress HEENT: Normal NECK: No JVD; No carotid bruits LYMPHATICS: No lymphadenopathy CARDIAC: RRR, no murmurs, rubs, gallops RESPIRATORY:  Clear to auscultation without rales, wheezing or rhonchi  ABDOMEN: Soft, non-tender, non-distended MUSCULOSKELETAL:  No edema; No deformity  SKIN: Warm and dry NEUROLOGIC:  Alert and oriented x 3 PSYCHIATRIC:  Normal affect    Signed, Redell Leiter, MD  03/02/2024 10:19 AM    [facility]

## 2024-03-02 ENCOUNTER — Ambulatory Visit (INDEPENDENT_AMBULATORY_CARE_PROVIDER_SITE_OTHER): Admitting: Physician Assistant

## 2024-03-02 ENCOUNTER — Ambulatory Visit: Attending: Cardiology | Admitting: Cardiology

## 2024-03-02 ENCOUNTER — Encounter: Payer: Self-pay | Admitting: Cardiology

## 2024-03-02 VITALS — BP 116/85 | HR 85 | Resp 20

## 2024-03-02 VITALS — BP 112/60 | HR 90 | Ht 62.0 in | Wt 159.4 lb

## 2024-03-02 DIAGNOSIS — I10 Essential (primary) hypertension: Secondary | ICD-10-CM

## 2024-03-02 DIAGNOSIS — I7789 Other specified disorders of arteries and arterioles: Secondary | ICD-10-CM

## 2024-03-02 DIAGNOSIS — G20A1 Parkinson's disease without dyskinesia, without mention of fluctuations: Secondary | ICD-10-CM

## 2024-03-02 DIAGNOSIS — I25118 Atherosclerotic heart disease of native coronary artery with other forms of angina pectoris: Secondary | ICD-10-CM | POA: Diagnosis not present

## 2024-03-02 DIAGNOSIS — G3184 Mild cognitive impairment, so stated: Secondary | ICD-10-CM

## 2024-03-02 DIAGNOSIS — E782 Mixed hyperlipidemia: Secondary | ICD-10-CM | POA: Diagnosis not present

## 2024-03-02 NOTE — Patient Instructions (Signed)
 It was a pleasure to see you today at our office.   Recommendations:   Follow up in 9 months  Follow up w Dr. Evonnie as scheduled in December 2025     [email or web address]   RECOMMENDATIONS FOR ALL PATIENTS WITH MEMORY PROBLEMS: 1. Continue to exercise (Recommend 30 minutes of walking everyday, or 3 hours every week) 2. Increase social interactions - continue going to [place] and enjoy social gatherings with friends and family 3. Eat healthy, avoid fried foods and eat more fruits and vegetables 4. Maintain adequate blood pressure, blood sugar, and blood cholesterol level. Reducing the risk of stroke and cardiovascular disease also helps promoting better memory. 5. Avoid stressful situations. Live a simple life and avoid aggravations. Organize your time and prepare for the next day in anticipation. 6. Sleep well, avoid any interruptions of sleep and avoid any distractions in the bedroom that may interfere with adequate sleep quality 7. Avoid sugar, avoid sweets as there is a strong link between excessive sugar intake, diabetes, and cognitive impairment We discussed the Mediterranean diet, which has been shown to help patients reduce the risk of progressive memory disorders and reduces cardiovascular risk. This includes eating fish, eat fruits and green leafy vegetables, nuts like almonds and hazelnuts, walnuts, and also use olive oil. Avoid fast foods and fried foods as much as possible. Avoid sweets and sugar as sugar use has been linked to worsening of memory function.  There is always a concern of gradual progression of memory problems. If this is the case, then we may need to adjust level of care according to patient needs. Support, both to the patient and caregiver, should then be put into place.      You have been referred for a neuropsychological evaluation (i.e., evaluation of memory and thinking abilities). Please bring someone with  you to this appointment if possible, as it is helpful for the doctor to hear from both you and another adult who knows you well. Please bring eyeglasses and hearing aids if you wear them.    The evaluation will take approximately 3 hours and has two parts:   The first part is a clinical interview with the neuropsychologist (Dr. Richie or Dr. Jackquline). During the interview, the neuropsychologist will speak with you and the individual you brought to the appointment.    The second part of the evaluation is testing with the doctor's technician Neal or Luke). During the testing, the technician will ask you to remember different types of material, solve problems, and answer some questionnaires. Your family member will not be present for this portion of the evaluation.   Please note: We must reserve several hours of the neuropsychologist's time and the psychometrician's time for your evaluation appointment. As such, there is a No-Show fee of $100. If you are unable to attend any of your appointments, please contact our office as soon as possible to reschedule.      DRIVING: Regarding driving, in patients with progressive memory problems, driving will be impaired. We advise to have someone else do the driving if trouble finding directions or if minor accidents are reported. Independent driving assessment is available to determine safety of driving.   If you are interested in the driving assessment, you can contact the following:  [facility] in [place] [phone]  [facility] [phone]  [facility] [phone]  [facility] [phone] or [phone]   FALL PRECAUTIONS: Be cautious when walking. Scan the area for obstacles that may increase  the risk of trips and falls. When getting up in the mornings, sit up at the edge of the bed for a few minutes before getting out of bed. Consider elevating the bed at the head end to avoid drop of blood  pressure when getting up. Walk always in a well-lit room (use night lights in the walls). Avoid area rugs or power cords from appliances in the middle of the walkways. Use a walker or a cane if necessary and consider physical therapy for balance exercise. Get your eyesight checked regularly.  FINANCIAL OVERSIGHT: Supervision, especially oversight when making financial decisions or transactions is also recommended.  HOME SAFETY: Consider the safety of the kitchen when operating appliances like stoves, microwave oven, and blender. Consider having supervision and share cooking responsibilities until no longer able to participate in those. Accidents with firearms and other hazards in the house should be identified and addressed as well.   ABILITY TO BE LEFT ALONE: If patient is unable to contact 911 operator, consider using LifeLine, or when the need is there, arrange for someone to stay with patients. Smoking is a fire hazard, consider supervision or cessation. Risk of wandering should be assessed by caregiver and if detected at any point, supervision and safe proof recommendations should be instituted.  MEDICATION SUPERVISION: Inability to self-administer medication needs to be constantly addressed. Implement a mechanism to ensure safe administration of the medications.      Mediterranean Diet A Mediterranean diet refers to food and lifestyle choices that are based on the traditions of countries located on the [employer] [place]. This way of eating has been shown to help prevent certain conditions and improve outcomes for people who have chronic diseases, like kidney disease and heart disease. What are tips for following this plan? Lifestyle  Cook and eat meals together with your family, when possible. Drink enough fluid to keep your urine clear or pale yellow. Be physically active every day. This includes: Aerobic exercise like running or swimming. Leisure activities like gardening, walking, or  housework. Get 7-8 hours of sleep each night. If recommended by your health care provider, drink red wine in moderation. This means 1 glass a day for nonpregnant women and 2 glasses a day for men. A glass of wine equals 5 oz (150 mL). Reading food labels  Check the serving size of packaged foods. For foods such as rice and pasta, the serving size refers to the amount of cooked product, not dry. Check the total fat in packaged foods. Avoid foods that have saturated fat or trans fats. Check the ingredients list for added sugars, such as corn syrup. Shopping  At the grocery store, buy most of your food from the areas near the walls of the store. This includes: Fresh fruits and vegetables (produce). Grains, beans, nuts, and seeds. Some of these may be available in unpackaged forms or large amounts (in bulk). Fresh seafood. Poultry and eggs. Low-fat dairy products. Buy whole ingredients instead of prepackaged foods. Buy fresh fruits and vegetables in-season from local farmers markets. Buy frozen fruits and vegetables in resealable bags. If you do not have access to quality fresh seafood, buy precooked frozen shrimp or canned fish, such as tuna, salmon, or sardines. Buy small amounts of raw or cooked vegetables, salads, or olives from the deli or salad bar at your store. Stock your pantry so you always have certain foods on hand, such as olive oil, canned tuna, canned tomatoes, rice, pasta, and beans. Cooking  Cook foods with extra-virgin  olive oil instead of using butter or other vegetable oils. Have meat as a side dish, and have vegetables or grains as your main dish. This means having meat in small portions or adding small amounts of meat to foods like pasta or stew. Use beans or vegetables instead of meat in common dishes like chili or lasagna. Experiment with different cooking methods. Try roasting or broiling vegetables instead of steaming or sauteing them. Add frozen vegetables to soups,  stews, pasta, or rice. Add nuts or seeds for added healthy fat at each meal. You can add these to yogurt, salads, or vegetable dishes. Marinate fish or vegetables using olive oil, lemon juice, garlic, and fresh herbs. Meal planning  Plan to eat 1 vegetarian meal one day each week. Try to work up to 2 vegetarian meals, if possible. Eat seafood 2 or more times a week. Have healthy snacks readily available, such as: Vegetable sticks with hummus. Greek yogurt. Fruit and nut trail mix. Eat balanced meals throughout the week. This includes: Fruit: 2-3 servings a day Vegetables: 4-5 servings a day Low-fat dairy: 2 servings a day Fish, poultry, or lean meat: 1 serving a day Beans and legumes: 2 or more servings a week Nuts and seeds: 1-2 servings a day Whole grains: 6-8 servings a day Extra-virgin olive oil: 3-4 servings a day Limit red meat and sweets to only a few servings a month What are my food choices? Mediterranean diet Recommended Grains: Whole-grain pasta. Brown rice. Bulgar wheat. Polenta. Couscous. Whole-wheat bread. [place] Madeira. Vegetables: Artichokes. Beets. Broccoli. Cabbage. Carrots. Eggplant. Green beans. Chard. Kale. Spinach. Onions. Leeks. Peas. Squash. Tomatoes. Peppers. Radishes. Fruits: Apples. Apricots. Avocado. Berries. Bananas. Cherries. Dates. Figs. Grapes. Lemons. Melon. Oranges. Peaches. Plums. Pomegranate. Meats and other protein foods: Beans. Almonds. Sunflower seeds. Pine nuts. Peanuts. Cod. Salmon. Scallops. Shrimp. Tuna. Tilapia. Clams. Oysters. Eggs. Dairy: Low-fat milk. Cheese. Greek yogurt. Beverages: Water . Red wine. Herbal tea. Fats and oils: Extra virgin olive oil. Avocado oil. Grape seed oil. Sweets and desserts: [place] yogurt with honey. Baked apples. Poached pears. Trail mix. Seasoning and other foods: Basil. Cilantro. Coriander. Cumin. Mint. Parsley. Sage. Rosemary. Tarragon. Garlic. Oregano. Thyme. Pepper. Balsalmic vinegar. Tahini. Hummus. Tomato  sauce. Olives. Mushrooms. Limit these Grains: Prepackaged pasta or rice dishes. Prepackaged cereal with added sugar. Vegetables: Deep fried potatoes (french fries). Fruits: Fruit canned in syrup. Meats and other protein foods: Beef. Pork. Lamb. Poultry with skin. Hot dogs. [place]. Dairy: Ice cream. Sour cream. Whole milk. Beverages: Juice. Sugar-sweetened soft drinks. Beer. Liquor and spirits. Fats and oils: Butter. Canola oil. Vegetable oil. Beef fat (tallow). Lard. Sweets and desserts: Cookies. Cakes. Pies. Candy. Seasoning and other foods: Mayonnaise. Premade sauces and marinades. The items listed may not be a complete list. Talk with your dietitian about what dietary choices are right for you. Summary The Mediterranean diet includes both food and lifestyle choices. Eat a variety of fresh fruits and vegetables, beans, nuts, seeds, and whole grains. Limit the amount of red meat and sweets that you eat. Talk with your health care provider about whether it is safe for you to drink red wine in moderation. This means 1 glass a day for nonpregnant women and 2 glasses a day for men. A glass of wine equals 5 oz (150 mL). This information is not intended to replace advice given to you by your health care provider. Make sure you discuss any questions you have with your health care provider. Document Released: 01/18/2016 Document Revised: 02/20/2016 Document Reviewed:  01/18/2016 Elsevier Interactive Patient Education  2017 [employer].   [address]

## 2024-03-02 NOTE — Progress Notes (Signed)
 Assessment/Plan:    Mild cognitive impairment likely of multiple etiologies  [name] is a very pleasant 78 y.o. 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  He is able to participate in his ADLs and to drive without difficulties.***.     Recommendations:   Follow up in 9 months. 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  Continue occupational therapy and speech therapy Patient scheduled to follow-up with Dr. Evonnie on 05/17/2024   Chronic vertigo This is chronic, present for many years Continue meclizine      Subjective:   This patient is accompanied in the office by his wife  who supplements the history. Previous records as well as any outside records available  were reviewed prior to todays visit.   Patient was last seen on 05/30/2023***.    Any changes in memory since last visit? About the same some good and bad days. I like to paint when I can, but if distracted may abandon the task and do something else repeats oneself?  Endorsed Disoriented when walking into a room?  Patient denies ***  Misplacing objects?  Patient denies ? Denies.   Any worsening depression?: denies.  Sometimes I have a crying spell. Hallucinations or paranoia?  Denies. A few times I wonder if there is an object or an animal, but is very real, but it may be my vision Seizures?   Denies.    Any sleep changes? Sleeps well with a CPAP. Denies vivid dreams ( I have less dreams than before), +REM behavior or sleepwalking *** snake!    Any hygiene concerns?   Denies.   Independent of bathing and dressing?  Endorsed  Does the patient needs help with medications? Patient is in charge but we work together on it   Who is in charge of the finances?  Patient is in charge      Any changes in appetite?  Denies, wife reports that he eats less than before. Drinks water  with meals.    Patient have trouble swallowing?  Denies.   Does the patient cook?  No   Any kitchen accidents such as leaving the stove on?   Denies.   Any headaches?    Denies.   Vision changes? Has cataracts, sees ophthalmology Chronic pain?  Denies.   Ambulates with difficulty? Not often, use the walker   Recent falls or head injuries?    Denies.      Unilateral weakness, numbness or  tingling?  Denies.   Any tremors?  Denies.   Any anosmia?    Denies.   Any incontinence of urine?  Denies.   Any bowel dysfunction?  Denies.      Patient lives wife .  He has home health once a week Does the patient drive? No ***  Initial visit 02/19/2023 How long did patient have memory difficulties? For the last year. Wife initially thought this was due to ADD, but changes became more evident over the last year. Patient has  some difficulty remembering recent conversations and people names.  He did post cards for his family reunion and he was stuck on the same subject. He had speech difficulties which had included dysarthria with some hypophonia, but after attending speech therapy sessions, this improved significantly.  He loves crossword puzzles, reading, crime TV.  repeats oneself?  Endorsed, especially with appointments.  Disoriented when walking into a room?  Patient denies    Leaving objects in unusual places?  Denies. Occasionally he misplaces the cell or keys but not in unusual places.   Wandering behavior? Denies.   Any personality changes ? Denies. Thinking if a task, if I cannot complete it may irritate me. My voice, hoarseness does not let me sing well at [facility] and causes me some stress Any history of depression?: Occasionally I am depressed, more like anxiety Hallucinations or paranoia?  Sometimes I see something moving and nothing is there, but it may be floaters He has been hearing things , such as mice in the attic, even exterminators came in and there was nothing there. Seizures? Denies.    Any sleep changes?  Sleeps better with the CPAP. Reports vivid dreams not that often since using the CPAP, in the past he was doing shadowboxing but not recently. Denies sleepwalking   Any hygiene concerns?  Denies.   Independent of bathing and dressing? Endorsed  Does the patient need help with medications? Wife is in charge because he was missing pills because it was too much trouble   Who is in charge of the finances? Wife  is in charge     Any changes in appetite? Not as much as before, sometimes I  am not hungry .  Tries to drink more water  because of dry mouth.    Patient have trouble swallowing? In the past he did, but after ST this has improved.  Does the patient cook? Yes, denies any accidents  Any headaches?  Denies.   Chronic pain? He has arthritis with CBP and has a back stimulator and  injections, last 1 week ago (Dr. Bonner)   Ambulates with difficulty?  He has been seen at our movement disorder clinic after noticing some difficulty with ambulation, with chronic change gait forward leaning trunk, as well as intermittent tremors.  After evaluation, it was felt that have vascular parkinsonism.  He denies any significant changes since his last visit.   recent falls or head injuries? Denies after PT . He now uses a walker since 11/2022 for stability      Vision changes? He reports floaters, has regular checkups.Denies double vision Unilateral weakness, numbness or tingling? Denies.   Any anosmia? Endorsed, for at least 1 year Any incontinence of urine? Denies.    Any bowel dysfunction? Denies.  Patient lives with wife   History of heavy alcohol intake? denies   History of heavy tobacco use? denies   Family history of dementia? Both with dementia ?type Does patient drive? Yes, denies getting lost  Retired Psychologist, sport and exercise at [employer], retired 2004   Neuropsych evaluation 09/11/23 Briefly, results indicated continued variability in attention, processing speed, executive functioning, and learning/memory. Compared to the prior evaluation in 2024, scores were generally stable, although mild, relative reductions in attention, processing speed, and executive functioning were observed. Given preserved functional independence, the patient continues to meet criteria for mild cognitive impairment. Etiology remains likely multifactorial, including a longstanding history of attention difficulties, possible vascular parkinsonism, chronic pain, mild low mood, and potential medication side effects (e.g., alprazolam , meclizine ). It is unclear why some scores were weaker today, especially given that the patient and his wife report stability and there have been no acute medical events documented. Continued monitoring over time is recommended.   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 Blanca   Chronic asthma 05/17/2011   Chronic low back pain with sciatica 11/14/2021   Chronic pain 11/21/2016   COPD (chronic obstructive pulmonary disease)    Dizziness 12/24/2021   Early satiety 09/11/2021   Erectile dysfunction    Esophageal motility disorder 09/11/2021   Generalized anxiety disorder    GERD (gastroesophageal reflux disease)    History of benign positional vertigo    History of colon cancer 03/01/2024   History of colonic polyps 09/11/2021   History of kidney stones    Hoarseness 09/11/2021   Hyperlipidemia    Hypertension    Hypogonadism male    prior use on Testim    Insomnia    Iron deficiency 09/11/2021   Left ventricular dysfunction 10/12/2018   Leg pain, left 11/14/2021   Lumbar disc disease    Major depressive disorder    MCI (mild cognitive impairment) 05/30/2023   Mild concentric left ventricular hypertrophy (LVH)    Muscle spasm 11/14/2021   OSA (obstructive sleep apnea) 11/12/2022   NPSG 10/21/22- AHI 28.7/ hr, desat to 89%/Mean 93%, body weight 165 lbs     Pain in both feet 08/10/2021   Peripheral vascular disease 12/25/2021   Pneumonia    Presbycusis of both ears 04/23/2018   Slow transit constipation 09/11/2021   Spinal cord stimulator status 11/14/2021   Spondylolisthesis of lumbar region 02/28/2015   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   Weight  decreased 09/11/2021     Past Surgical History:  Procedure Laterality Date   BACK SURGERY     BRAVO PH STUDY  02/18/2012   Procedure: BRAVO PH STUDY;  Surgeon: Lamar LULLA Bunk, MD;  Location: [facility];  Service: Endoscopy;  Laterality: N/A;   CARDIAC CATHETERIZATION  06/10/2004   Dr. Blanca   CHEILECTOMY  10/16/2011   Procedure: CHEILECTOMY;  Surgeon: Deward DELENA Schwartz, MD;  Location: [facility];  Service: Orthopedics;  Laterality: Right;   CHOLECYSTECTOMY     COLONOSCOPY     CORONARY ANGIOPLASTY     1995  after MI   CORONARY ANGIOPLASTY WITH STENT PLACEMENT     1999    ESOPHAGOGASTRODUODENOSCOPY  02/18/2012   Procedure: ESOPHAGOGASTRODUODENOSCOPY (EGD);  Surgeon: Lamar LULLA Bunk, MD;  Location: [facility];  Service: Endoscopy;  Laterality: N/A;   FOOT SURGERY     joint scraped, arthritis right foot   HERNIA REPAIR     LUMBAR DISC SURGERY     x2   right index finger  mass removed- arthritis     Dr. Camella   [facility]  Right 05/18/2021   medial toe consistent with callus   SKIN BIOPSY Left 05/17/2021   actinic keratosis   SPINAL CORD STIMULATOR BATTERY EXCHANGE N/A 10/11/2021   Procedure: SPINAL CORD STIMULATOR BATTERY EXCHANGE;  Surgeon: Burnetta Aures, MD;  Location: [facility];  Service: Orthopedics;  Laterality: N/A;  60 mins   SPINAL CORD STIMULATOR INSERTION N/A 11/21/2016   Procedure: LUMBAR SPINAL CORD STIMULATOR INSERTION;  Surgeon: Burnetta Aures, MD;  Location: [facility];  Service: Orthopedics;  Laterality: N/A;  Requests 2.5 hrs   TONSILLECTOMY     1953   UPPER GI ENDOSCOPY  [id number]   VASECTOMY     1980     PREVIOUS MEDICATIONS:   CURRENT MEDICATIONS:  Outpatient Encounter Medications as of 03/02/2024  Medication Sig   albuterol  (PROAIR  HFA) 108 (90 Base) MCG/ACT inhaler Inhale 2 puffs into the lungs every 6 (six) hours as needed for wheezing or shortness of breath.   ALPRAZolam  (XANAX ) 0.25 MG tablet TAKE 1 TABLET BY MOUTH 2 TIMES DAILY AS NEEDED FOR  ANXIETY.   amoxicillin  (AMOXIL ) 500 MG tablet Take 500 mg by mouth once a week. Take one dose before a dental procedure   atorvastatin  (LIPITOR ) 20 MG tablet Take 1 tablet (20 mg total) by mouth daily.   azelastine  (ASTELIN ) 0.1 % nasal spray Place 2 sprays into both nostrils 2 (two) times daily. Use in each nostril as directed   buPROPion  (WELLBUTRIN  XL) 300 MG 24 hr tablet TAKE 1 TABLET BY MOUTH DAILY   calcium  carbonate (OS-CAL) 600 MG TABS tablet Take 600 mg by mouth daily with breakfast.   carbidopa -levodopa  (SINEMET  IR) 25-100 MG tablet Take 1 tablet by mouth 3 (three) times daily. 8am/noon/4pm   cetirizine (ZYRTEC) 10 MG tablet Take 10 mg by mouth daily.   clopidogrel  (PLAVIX ) 75 MG tablet TAKE 1 TABLET BY MOUTH DAILY   desvenlafaxine  (PRISTIQ ) 50 MG 24 hr tablet Take 1 tablet (50 mg total) by mouth daily.   diltiazem  (CARDIZEM  CD) 240 MG 24 hr capsule TAKE 1 CAPSULE BY MOUTH EVERY DAY   ferrous sulfate 325 (65 FE) MG tablet Take 325 mg by mouth daily. (Patient taking differently: Take 325 mg by mouth daily. Patient takes intermittently)   Flaxseed, Linseed, (FLAX SEED OIL) 1000 MG CAPS Take 1,000 mg by mouth every 7 (seven) days.   fluocinonide (LIDEX) 0.05 % external solution Apply 1 Application topically 2 (two) times daily.   GLUCOSAMINE CHONDROITIN COMPLX PO Take 1 tablet by mouth daily.   guaiFENesin (MUCINEX) 600 MG 12 hr tablet Take 600 mg by mouth 2 (two) times daily as needed for cough.   meclizine  (ANTIVERT ) 25 MG tablet TAKE 1 TWICE A DAY AS NEEDED FOR DIZZINESS   methylphenidate  (CONCERTA ) 54 MG PO CR tablet Take 1 tablet (54 mg total) by mouth every morning.   Multiple Vitamins-Minerals (MULTIVITAMIN WITH MINERALS) tablet Take 1 tablet by mouth daily.   pantoprazole  (PROTONIX ) 40 MG tablet Take 1 tablet (40 mg  total) by mouth daily.   umeclidinium-vilanterol (ANORO ELLIPTA ) 62.5-25 MCG/ACT AEPB Inhale 1 puff into the lungs daily at 6 (six) AM.   No facility-administered  encounter medications on file as of 03/02/2024.     Objective:     PHYSICAL EXAMINATION:    VITALS:  There were no vitals filed for this visit.  GEN:  The patient appears stated age and is in NAD. HEENT:  Normocephalic, atraumatic.   Neurological examination:  General: NAD, well-groomed, appears stated age. Orientation: The patient is alert. Oriented to person, place and not to date.*** Cranial nerves: There is good facial symmetry.The speech is fluent and clear. No aphasia or dysarthria. Fund of knowledge is appropriate. Recent memory impaired and remote memory is normal.  Attention and concentration are normal.  Able to name objects and repeat phrases.  Hearing is intact to conversational tone ***.   Delayed recall *** Sensation: Sensation is intact to light touch throughout Motor: Strength is at least antigravity x4. DTR's 2/4 in UE/LE      02/19/2023   11:00 AM  Montreal Cognitive Assessment   Visuospatial/ Executive (0/5) 4  Naming (0/3) 3  Attention: Read list of digits (0/2) 2  Attention: Read list of letters (0/1) 1  Attention: Serial 7 subtraction starting at 100 (0/3) 1  Language: Repeat phrase (0/2) 2  Language : Fluency (0/1) 0  Abstraction (0/2) 1  Delayed Recall (0/5) 3  Orientation (0/6) 5  Total 22  Adjusted Score (based on education) 22       01/01/2023   10:22 AM  MMSE - Mini Mental State Exam  Orientation to time 5  Orientation to Place 3  Registration 3  Attention/ Calculation 4  Recall 3  Language- name 2 objects 2  Language- repeat 1  Language- follow 3 step command 3  Language- read & follow direction 1  Write a sentence 1  Copy design 1  Total score 27       Movement examination: Tone: There is normal tone in the UE/LE Abnormal movements:  no tremor.  No myoclonus.  No asterixis.   Coordination:  There is no decremation with RAM's. Normal finger to nose  Gait and Station: The patient has no difficulty arising out of a deep-seated  chair without the use of the hands. The patient's stride length is short.  He flexes forward.  Uses a walker to ambulate..  Gait is cautious and narrow.   Thank you for allowing us  the opportunity to participate in the care of this nice patient. Please do not hesitate to contact us  for any questions or concerns.   Total time spent on today's visit was *** minutes dedicated to this patient today, preparing to see patient, examining the patient, ordering tests and/or medications and counseling the patient, documenting clinical information in the EHR or other health record, independently interpreting results and communicating results to the patient/family, discussing treatment and goals, answering patient's questions and coordinating care.  Cc:  [name], MD  Camie Sevin 03/02/2024 6:50 AM

## 2024-03-02 NOTE — Patient Instructions (Signed)
 Medication Instructions:  Your physician recommends that you continue on your current medications as directed. Please refer to the Current Medication list given to you today.  *If you need a refill on your cardiac medications before your next appointment, please call your pharmacy*  Lab Work: None If you have labs (blood work) drawn today and your tests are completely normal, you will receive your results only by: MyChart Message (if you have MyChart) OR A paper copy in the mail If you have any lab test that is abnormal or we need to change your treatment, we will call you to review the results.  Testing/Procedures: None  Follow-Up: At [facility], you and your health needs are our priority.  As part of our continuing mission to provide you with exceptional heart care, our providers are all part of one team.  This team includes your primary Cardiologist (physician) and Advanced Practice Providers or APPs (Physician Assistants and Nurse Practitioners) who all work together to provide you with the care you need, when you need it.  Your next appointment:   1 year(s)  Provider:   Redell Leiter, MD    We recommend signing up for the patient portal called MyChart.  Sign up information is provided on this After Visit Summary.  MyChart is used to connect with patients for Virtual Visits (Telemedicine).  Patients are able to view lab/test results, encounter notes, upcoming appointments, etc.  Non-urgent messages can be sent to your provider as well.   To learn more about what you can do with MyChart, go to [email or web address].   Other Instructions Check home blood pressure both sitting and standing and contact if less than 100.

## 2024-04-01 ENCOUNTER — Ambulatory Visit: Admitting: Family Medicine

## 2024-04-01 ENCOUNTER — Ambulatory Visit: Payer: Self-pay

## 2024-04-01 VITALS — BP 126/82 | HR 72 | Wt 157.8 lb

## 2024-04-01 DIAGNOSIS — S0501XA Injury of conjunctiva and corneal abrasion without foreign body, right eye, initial encounter: Secondary | ICD-10-CM

## 2024-04-01 MED ORDER — ERYTHROMYCIN 5 MG/GM OP OINT: 1.0000 | TOPICAL_OINTMENT | Freq: Three times a day (TID) | OPHTHALMIC | 0 refills | Status: DC

## 2024-04-01 NOTE — Telephone Encounter (Signed)
 Called [facility] to confirm they will see pt for this complaint in office.  FYI Only or Action Required?: FYI only for provider.  Patient was last seen in primary care on 01/14/2024 by [name], MD.  Called Nurse Triage reporting Eye Problem.  Symptoms began today.  Interventions attempted: Rest, hydration, or home remedies.  Symptoms are: unchanged.  Triage Disposition: See HCP Within 4 Hours (Or PCP Triage) (overriding See Physician Within 24 Hours)  Patient/caregiver understands and will follow disposition?:  Reason for Disposition  Scratch on white of the eye (sclera)  Answer Assessment - Initial Assessment Questions Patient sustained scratched to eye from a cat this morning.   1. MECHANISM: How did the injury happen?      Cat scratch  2. ONSET: When did the injury happen? (e.g., minutes, hours ago)      Right before calling  3. LOCATION: What part of the eye is injured? (cornea, sclera, eyelid, or periorbital tissue)     Right eye  4. APPEARANCE: What does the eye look like?      States he has not been able to look at it  5. VISION: Do you have blurred vision?     Blurry  Protocols used: Eye Injury-A-AH

## 2024-04-01 NOTE — Progress Notes (Signed)
   Subjective:    Patient ID: [name], male    DOB: 31-Mar-1946, 78 y.o.   MRN: [id number]  Discussed the use of AI scribe software for clinical note transcription with the patient, who gave verbal consent to proceed.  History of Present Illness   [name] is a 78 year old male who presents with an eye scratch from a cat.  This morning, he was scratched by a cat on his eye. Initially, there was no sharp pain, but he could feel cat hair sliding across his eyeball. He describes the sensation as not being sharp but noticeable due to the cat hair.  Currently, he does not experience pain, but the eye initially felt dry and remains sensitive.           Review of Systems     Objective:    Physical Exam Right eye exam shows the cornea to be clear.  Fluorescein dye was used with magnification and did show questionable horizontal lesion to the cornea.           Assessment & Plan:  Abrasion of right cornea, initial encounter - Plan: erythromycin  ophthalmic ointment    Acute eye sensitivity post-cat scratch. Mild pain and dryness initially, now sensitivity. Corneal abrasion considered but unlikely due to absence of severe pain. - I will go ahead and treat as if it is an abrasion.  I will use a ointment to help soothe the eye and explained that usually within 24 to 48 hours the symptoms should disappear.

## 2024-04-08 ENCOUNTER — Ambulatory Visit: Admitting: Occupational Therapy

## 2024-04-08 ENCOUNTER — Ambulatory Visit: Admitting: Speech Pathology

## 2024-04-08 ENCOUNTER — Ambulatory Visit: Admitting: Physical Therapy

## 2024-04-14 ENCOUNTER — Ambulatory Visit
Admission: RE | Admit: 2024-04-14 | Discharge: 2024-04-14 | Disposition: A | Source: Ambulatory Visit | Attending: Family Medicine | Admitting: Family Medicine

## 2024-04-14 ENCOUNTER — Encounter: Payer: Self-pay | Admitting: Family Medicine

## 2024-04-14 ENCOUNTER — Ambulatory Visit
Admission: RE | Admit: 2024-04-14 | Discharge: 2024-04-14 | Disposition: A | Discharge status: Home / Self Care | Source: Ambulatory Visit | Attending: Family Medicine | Admitting: Family Medicine

## 2024-04-14 ENCOUNTER — Ambulatory Visit: Admitting: Family Medicine

## 2024-04-14 VITALS — BP 112/74 | HR 89 | Ht 70.0 in | Wt 160.4 lb

## 2024-04-14 DIAGNOSIS — R109 Unspecified abdominal pain: Secondary | ICD-10-CM

## 2024-04-14 NOTE — Progress Notes (Signed)
   Subjective:    Patient ID: [name], male    DOB: [birth year], 79 y.o.   MRN: [id number]  Discussed the use of AI scribe software for clinical note transcription with the patient, who gave verbal consent to proceed.  History of Present Illness   [name] is a 78 year old male who presents with left-sided rib and groin pain exacerbated by motion.  He has been experiencing severe pain on the left side of his rib area extending down to the groin for several weeks. The pain is exacerbated by motion, particularly when moving side to side, and causes significant difficulty getting out of bed. He describes the sensation as 'carrying rocks around in my abdomen.'  The pain does not radiate to his legs or arms and is not associated with nausea, vomiting, or changes in bowel habits. No pain is noted when sitting still.  He has a history of urinary tract issues and experienced frequent urination for two days last week, which has since resolved. He does not report any current urinary symptoms.           Review of Systems     Objective:    Physical Exam Abdominal exam shows no masses or tenderness.  Pain is elicited with motion of the back.  No rib tenderness.          Assessment & Plan:  Left abdominal and left flank pain with motion Pain likely musculoskeletal, possibly rib cage involvement. - Ordered chest x-ray. - Ordered thoracic spine x-ray.  Vertigo Reports vertigo upon waking, especially when getting out of bed.

## 2024-04-19 ENCOUNTER — Ambulatory Visit: Payer: Self-pay | Admitting: Family Medicine

## 2024-04-20 ENCOUNTER — Ambulatory Visit: Admitting: Physical Therapy

## 2024-04-20 ENCOUNTER — Ambulatory Visit: Attending: Occupational Medicine | Admitting: Occupational Therapy

## 2024-04-20 DIAGNOSIS — R278 Other lack of coordination: Secondary | ICD-10-CM | POA: Insufficient documentation

## 2024-04-20 DIAGNOSIS — R2681 Unsteadiness on feet: Secondary | ICD-10-CM | POA: Insufficient documentation

## 2024-04-20 NOTE — Therapy (Signed)
 Occupational Therapy Parkinson's Disease Screen  Hand dominance:  Right   Physical Performance Test item #2 (simulated eating):  14 sec 14 sec  9-hole peg test:    RUE  34 sec        LUE  40 sec  Rt = 39 sec, Lt = 40 sec  Box & Blocks Test:   RUE  41 blocks        LUE  41 blocks ( pt hit barrier B multiple times) Rt =45 blocks Left 45 blocks   Change in ability to perform ADLs/IADLs:  none reported  Pt does not require occupational therapy services at this time.  Recommended occupational therapy screen in 6 months. Emphasized importance of completing HEP consistently.  Contact clinic/physician should functional status change.

## 2024-04-20 NOTE — Therapy (Signed)
 [facility] [address] [place], [place], [address] Phone: [phone]   Fax:  [phone]  Patient Details  Name: [name] MRN: [id number] Date of Birth: Nov 06, 1945 Referring Provider:  [name], MD  Encounter Date: 04/20/2024  Physical Therapy Parkinson's Disease Screen   Timed Up and Go test:12.16s w/RW (previously 19.2s w/RW)  10 meter walk test:32.8' over 14.07s = 2.33 ft/s  (previously 1.63 ft/s w/RW)   5 time sit to stand test: 12s no UE support (previously 16.1s)   Pt reports he has had one fall, was trying to help his wife bring in groceries and fell in the house. Was able to get up on his own. Is working on his exercises at home and just finished a round of home health OT, PT and SLP. Knows that he needs to work on his exercises more at home.   Patient does not require Physical Therapy services at this time.  Recommend Physical Therapy screen in 6 mo.   Marlon BRAVO Quantasia Stegner, PT, DPT 04/20/2024, 10:29 AM  [place] [facility] [address] [place], [place], [address] Phone: [phone]   Fax:  [phone]

## 2024-05-05 ENCOUNTER — Other Ambulatory Visit: Payer: Self-pay | Admitting: Family Medicine

## 2024-05-05 DIAGNOSIS — I10 Essential (primary) hypertension: Secondary | ICD-10-CM

## 2024-05-12 NOTE — Progress Notes (Unsigned)
 Assessment/Plan:   1.  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  -he's still essentially only taking carbidopa /levodopa  qday or bid (last being at bedtime).  Encouraged taking tid at 8am/noon/4pm  -encouraged exercise  -unable to have MRI brain   2.  Chronic vertigo             -This has been going on for many years             -on meclizine    3.  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 Gayland in April, 2025.  He mentioned medications as a role in diminishing cognition.  Subjective:   [name] was seen today in follow up for testing results. This patient is accompanied in the office by his spouse who supplements the history.  Last visit, we restarted his levodopa  and at this time encouraged him to take it 3 times a day, with dosing being at 8 AM/noon/4 PM.  He is still not taking the middle of day dosage and puts the last dose at bed.  He had 1 fall since last visit while bringing groceries into the home and hands were full and ended up falling as the cats may have tripped him.  He was able to get up on his own.  Physical and occupational therapy screens were done in November and it was felt he did not need therapy.  Current movement disorder medications: Carbidopa /levodopa  25/100, 1 tablet at 8 AM/noon/4 PM  Prior medications: Levodopa  (was only taking twice daily and was not necessarily helping)   ALLERGIES:   Allergies  Allergen Reactions   Oxycodone  Nausea And Vomiting and Other (See Comments)    CURRENT MEDICATIONS:  Current Meds  Medication Sig   albuterol  (PROAIR  HFA) 108 (90 Base) MCG/ACT inhaler Inhale 2 puffs into the lungs every 6 (six) hours as needed for wheezing or shortness of breath.   ALPRAZolam   (XANAX ) 0.25 MG tablet TAKE 1 TABLET BY MOUTH 2 TIMES DAILY AS NEEDED FOR ANXIETY.   atorvastatin  (LIPITOR ) 20 MG tablet Take 1 tablet (20 mg total) by mouth daily.   azelastine  (ASTELIN ) 0.1 % nasal spray Place 2 sprays into both nostrils 2 (two) times daily. Use in each nostril as directed   buPROPion  (WELLBUTRIN  XL) 300 MG 24 hr tablet TAKE 1 TABLET BY MOUTH DAILY   calcium  carbonate (OS-CAL) 600 MG TABS tablet Take 600 mg by mouth daily with breakfast.   carbidopa -levodopa  (SINEMET  IR) 25-100 MG tablet Take 1 tablet by mouth 3 (three) times daily. 8am/noon/4pm   cetirizine (ZYRTEC) 10 MG tablet Take 10 mg by mouth daily.   clopidogrel  (PLAVIX ) 75 MG tablet TAKE 1 TABLET BY MOUTH DAILY   desvenlafaxine  (PRISTIQ ) 50 MG 24 hr tablet Take 1 tablet (50 mg total) by mouth daily.   diltiazem  (CARDIZEM  CD) 240 MG 24 hr capsule TAKE 1 CAPSULE BY MOUTH EVERY DAY   ferrous sulfate 325 (65 FE) MG tablet Take 325 mg by mouth daily.   Flaxseed, Linseed, (FLAX SEED OIL) 1000 MG CAPS Take 1,000 mg by mouth every 7 (seven) days.   GLUCOSAMINE  CHONDROITIN COMPLX PO Take 1 tablet by mouth daily.   guaiFENesin (MUCINEX) 600 MG 12 hr tablet Take 600 mg by mouth 2 (two) times daily as needed for cough.   meclizine  (ANTIVERT ) 25 MG tablet TAKE 1 TWICE A DAY AS NEEDED FOR DIZZINESS   methylphenidate  (CONCERTA ) 54 MG PO CR tablet Take 1 tablet (54 mg total) by mouth every morning.   Multiple Vitamins-Minerals (MULTIVITAMIN WITH MINERALS) tablet Take 1 tablet by mouth daily.   pantoprazole  (PROTONIX ) 40 MG tablet Take 1 tablet (40 mg total) by mouth daily.   triamcinolone cream (KENALOG) 0.1 % Apply 1 Application topically.   umeclidinium-vilanterol (ANORO ELLIPTA ) 62.5-25 MCG/ACT AEPB Inhale 1 puff into the lungs daily at 6 (six) AM.     Objective:   PHYSICAL EXAMINATION:    VITALS:   Vitals:   05/17/24 0852  BP: 122/84  Pulse: 79  SpO2: 97%  Weight: 159 lb (72.1 kg)    GEN:  The patient appears  stated age and is in NAD. HEENT:  Normocephalic, atraumatic.  The mucous membranes are moist. The superficial temporal arteries are without ropiness or tenderness. CV:  RRR Lungs:  CTAB   Neurological examination:  Orientation: The patient is alert and oriented x3. Cranial nerves: There is good facial symmetry without facial hypomimia. The speech is fluent and clear. Soft palate rises symmetrically and there is no tongue deviation. Hearing is intact to conversational tone.  He is wearing his hearing aids. Sensation: Sensation is intact to light touch throughout Motor: Strength is at least antigravity x4.  Movement examination: Tone: There is nl tone in the bilateral upper extremities.  The tone in the lower extremities is nl.  Abnormal movements: none even with distraction procedures Coordination:  There is mild decremation with finger taps on the R Gait and Station: The patient pushes off to arise.  He ambulates with his walker.  He is forward flexed and ambulates on his toes with some festination.  He drags the R leg.  He has trouble when he gets back to the chair approximating where the chair is.   I have reviewed and interpreted the following labs independently    Chemistry      Component Value Date/Time   NA 135 08/09/2023 1414   NA 142 07/09/2022 1230   K 3.7 08/09/2023 1414   CL 106 08/09/2023 1414   CO2 23 08/09/2023 1414   BUN 15 08/09/2023 1414   BUN 11 07/09/2022 1230   CREATININE 0.93 08/09/2023 1414   CREATININE 1.07 10/15/2016 1056      Component Value Date/Time   CALCIUM  8.9 08/09/2023 1414   ALKPHOS 51 09/09/2022 2041   AST 22 09/09/2022 2041   ALT 24 09/09/2022 2041   BILITOT 0.4 09/09/2022 2041   BILITOT 0.3 07/09/2022 1230       Lab Results  Component Value Date   WBC 6.7 08/09/2023   HGB 12.0 (L) 08/09/2023   HCT 37.8 (L) 08/09/2023   MCV 87.7 08/09/2023   PLT 264 08/09/2023    Lab Results  Component Value Date   TSH 0.69 02/19/2023      Cc:  [name], MD

## 2024-05-17 ENCOUNTER — Encounter: Payer: Self-pay | Admitting: Neurology

## 2024-05-17 ENCOUNTER — Ambulatory Visit: Admitting: Neurology

## 2024-05-17 VITALS — BP 122/84 | HR 79 | Wt 159.0 lb

## 2024-05-17 DIAGNOSIS — G20A1 Parkinson's disease without dyskinesia, without mention of fluctuations: Secondary | ICD-10-CM | POA: Diagnosis not present

## 2024-05-17 DIAGNOSIS — G3184 Mild cognitive impairment, so stated: Secondary | ICD-10-CM | POA: Diagnosis not present

## 2024-05-17 MED ORDER — CARBIDOPA-LEVODOPA 25-100 MG PO TABS: 1.0000 | ORAL_TABLET | Freq: Three times a day (TID) | ORAL | 1 refills | Status: AC

## 2024-05-17 NOTE — Patient Instructions (Signed)
  VISIT SUMMARY: Today, we discussed your Parkinson's disease management, focusing on medication adherence and fall prevention. You mentioned having difficulty sticking to your carbidopa /levodopa  schedule, especially the midday dose, and experiencing a recent fall while bringing in groceries. We also talked about your interest in joining a drumming class for exercise.  YOUR PLAN: -PARKINSON'S DISEASE WITH GAIT INSTABILITY AND FALLS: Parkinson's disease is a disorder of the nervous system that affects movement, often including tremors. Your gait instability and falls are related to this condition. We discussed the importance of using your walker consistently to prevent falls. We also recommended setting phone alarms for medication reminders, using pill organizers, or considering wearable devices to help you stick to your medication schedule. Please continue with your current medication regimen and focus on taking your doses on time.  INSTRUCTIONS: Please follow up with us  if you continue to have difficulty with your medication schedule or experience any more falls. Consider joining the drumming class for exercise, as it may help with your mobility and overall well-being.                      Contains text generated by [employer].                                 Contains text generated by [employer].

## 2024-10-19 ENCOUNTER — Ambulatory Visit: Admitting: Physical Therapy

## 2024-10-19 ENCOUNTER — Ambulatory Visit: Admitting: Occupational Therapy

## 2024-11-16 ENCOUNTER — Ambulatory Visit: Admitting: Neurology

## 2025-01-04 ENCOUNTER — Ambulatory Visit: Payer: Self-pay

## 2025-01-14 ENCOUNTER — Ambulatory Visit: Admitting: Family Medicine

## 2025-01-17 ENCOUNTER — Ambulatory Visit: Payer: Self-pay | Admitting: Family Medicine
